# Patient Record
Sex: Female | Born: 1955 | Race: Black or African American | Hispanic: No | Marital: Single | Smoking: Never smoker
Health system: Southern US, Community
[De-identification: ages and names within clinical notes are randomized; demographics above are authoritative.]

## PROBLEM LIST (undated history)

## (undated) DIAGNOSIS — I5020 Unspecified systolic (congestive) heart failure: Secondary | ICD-10-CM

## (undated) DIAGNOSIS — F419 Anxiety disorder, unspecified: Secondary | ICD-10-CM

## (undated) DIAGNOSIS — Z992 Dependence on renal dialysis: Secondary | ICD-10-CM

## (undated) DIAGNOSIS — I5022 Chronic systolic (congestive) heart failure: Secondary | ICD-10-CM

## (undated) DIAGNOSIS — N186 End stage renal disease: Secondary | ICD-10-CM

## (undated) DIAGNOSIS — I10 Essential (primary) hypertension: Secondary | ICD-10-CM

## (undated) HISTORY — DX: End stage renal disease: Z99.2

---

## 2014-12-06 ENCOUNTER — Emergency Department (HOSPITAL_COMMUNITY): Payer: 59

## 2014-12-06 ENCOUNTER — Encounter (HOSPITAL_COMMUNITY): Payer: Self-pay | Admitting: Emergency Medicine

## 2014-12-06 ENCOUNTER — Emergency Department (HOSPITAL_COMMUNITY)
Admission: EM | Admit: 2014-12-06 | Discharge: 2014-12-06 | Disposition: A | Discharge status: Home / Self Care | Payer: 59 | Attending: Emergency Medicine | Admitting: Emergency Medicine

## 2014-12-06 DIAGNOSIS — Z880 Allergy status to penicillin: Secondary | ICD-10-CM | POA: Insufficient documentation

## 2014-12-06 DIAGNOSIS — J40 Bronchitis, not specified as acute or chronic: Secondary | ICD-10-CM

## 2014-12-06 DIAGNOSIS — J209 Acute bronchitis, unspecified: Secondary | ICD-10-CM | POA: Diagnosis not present

## 2014-12-06 DIAGNOSIS — R509 Fever, unspecified: Secondary | ICD-10-CM

## 2014-12-06 DIAGNOSIS — I10 Essential (primary) hypertension: Secondary | ICD-10-CM | POA: Diagnosis not present

## 2014-12-06 DIAGNOSIS — R0789 Other chest pain: Secondary | ICD-10-CM | POA: Insufficient documentation

## 2014-12-06 DIAGNOSIS — R000 Tachycardia, unspecified: Secondary | ICD-10-CM | POA: Diagnosis not present

## 2014-12-06 DIAGNOSIS — R0981 Nasal congestion: Secondary | ICD-10-CM | POA: Diagnosis present

## 2014-12-06 HISTORY — DX: Essential (primary) hypertension: I10

## 2014-12-06 LAB — BASIC METABOLIC PANEL
ANION GAP: 11 (ref 5–15)
BUN: 9 mg/dL (ref 6–20)
CALCIUM: 9.3 mg/dL (ref 8.9–10.3)
CO2: 24 mmol/L (ref 22–32)
CREATININE: 1.08 mg/dL — AB (ref 0.44–1.00)
Chloride: 100 mmol/L — ABNORMAL LOW (ref 101–111)
GFR calc Af Amer: >60 mL/min (ref >60)
GFR, EST NON AFRICAN AMERICAN: 56 mL/min — AB (ref >60)
GLUCOSE: 105 mg/dL — AB (ref 65–99)
Potassium: 3.6 mmol/L (ref 3.5–5.1)
Sodium: 135 mmol/L (ref 135–145)

## 2014-12-06 LAB — CBC WITH DIFFERENTIAL/PLATELET
BASOS ABS: 0 10*3/uL (ref 0.0–0.1)
BASOS PCT: 0 %
EOS PCT: 0 %
Eosinophils Absolute: 0 10*3/uL (ref 0.0–0.7)
HCT: 37.1 % (ref 36.0–46.0)
Hemoglobin: 11.9 g/dL — ABNORMAL LOW (ref 12.0–15.0)
LYMPHS PCT: 12 %
Lymphs Abs: 1.2 10*3/uL (ref 0.7–4.0)
MCH: 27.9 pg (ref 26.0–34.0)
MCHC: 32.1 g/dL (ref 30.0–36.0)
MCV: 86.9 fL (ref 78.0–100.0)
MONO ABS: 1.2 10*3/uL — AB (ref 0.1–1.0)
Monocytes Relative: 12 %
Neutro Abs: 7.8 10*3/uL — ABNORMAL HIGH (ref 1.7–7.7)
Neutrophils Relative %: 76 %
PLATELETS: 260 10*3/uL (ref 150–400)
RBC: 4.27 MIL/uL (ref 3.87–5.11)
RDW: 13.9 % (ref 11.5–15.5)
WBC: 10.3 10*3/uL (ref 4.0–10.5)

## 2014-12-06 LAB — I-STAT CG4 LACTIC ACID, ED: Lactic Acid, Venous: 1.41 mmol/L (ref 0.5–2.0)

## 2014-12-06 IMAGING — DX DG CHEST 2V
2 series · 2 of 2 positions shown · non-contrast
Comparison: None.

CLINICAL DATA: Chest congestion with runny nose and productive
cough for 2 weeks. Fever. Former smoker.

EXAM:
CHEST  2 VIEW

[chest pa]
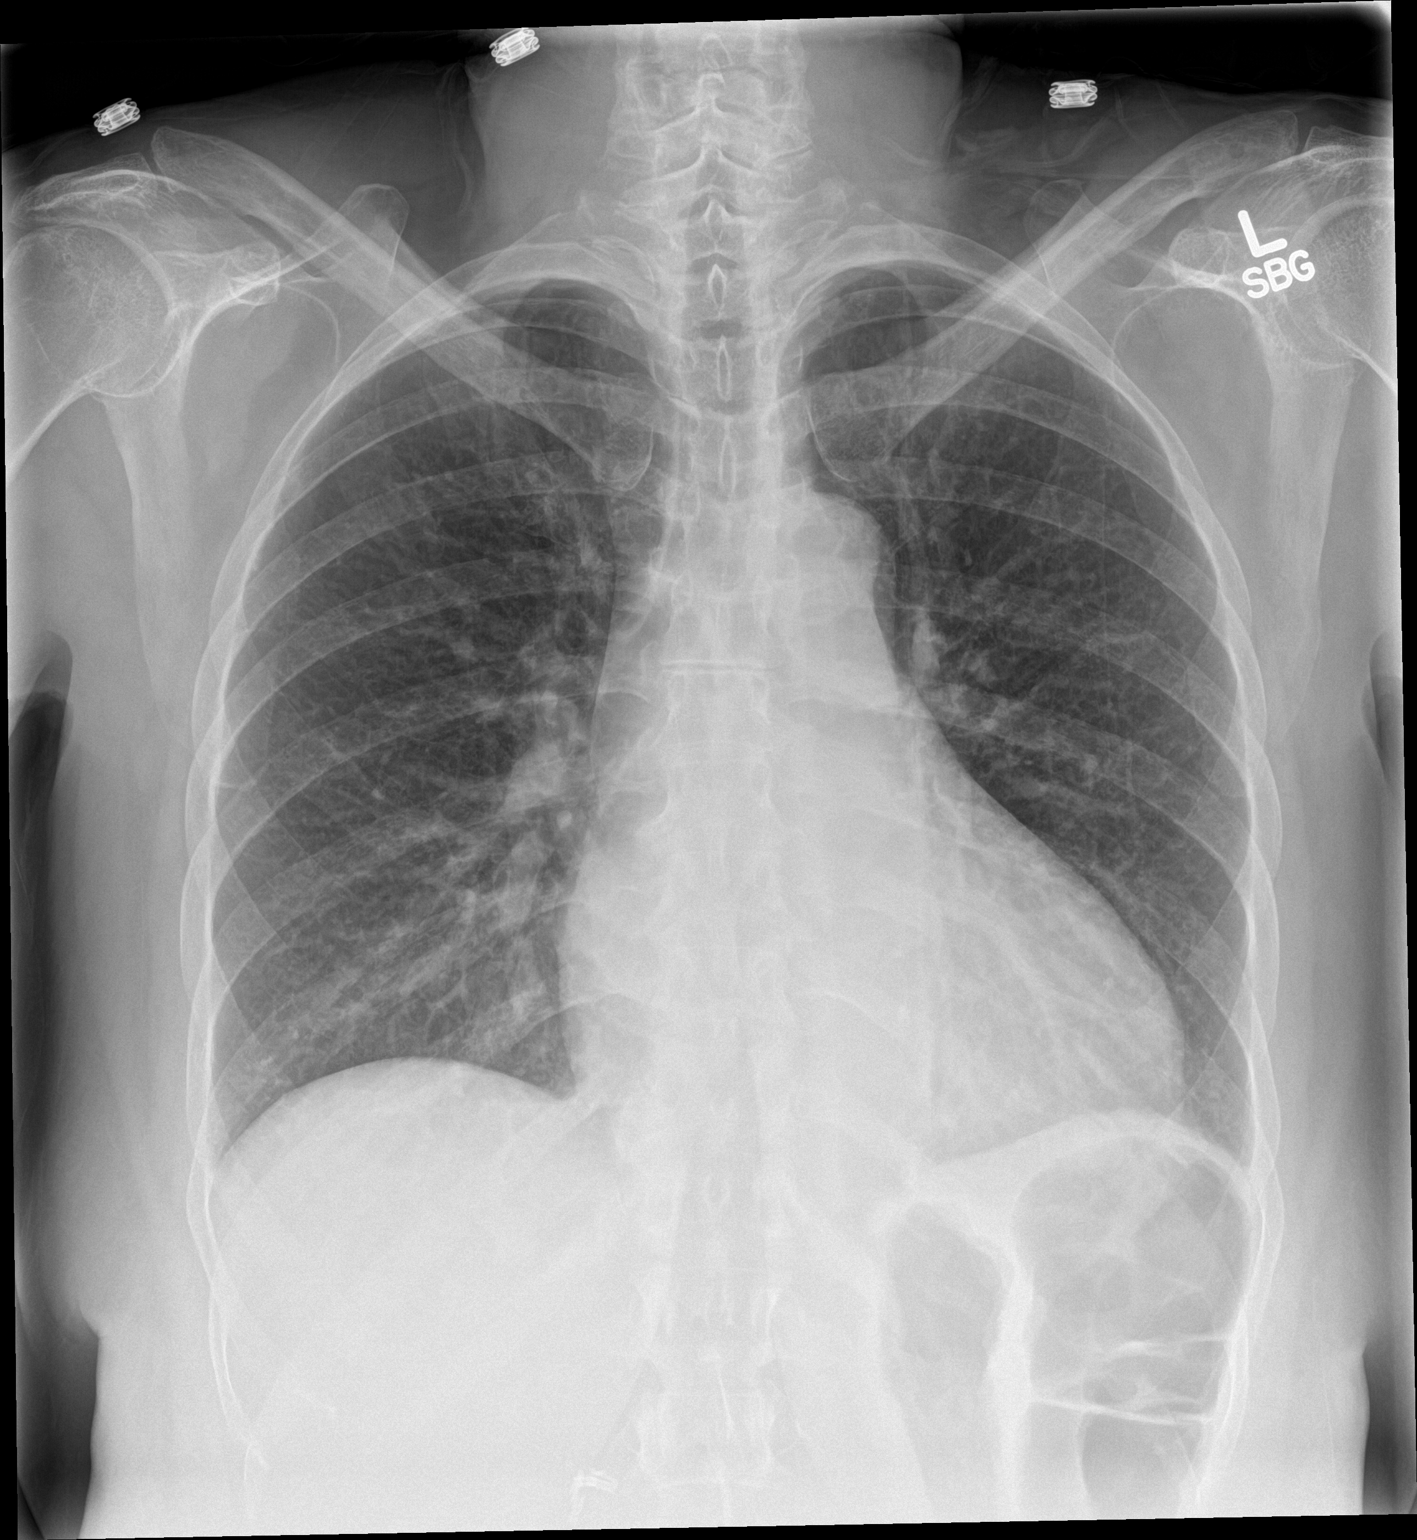

[chest lat]
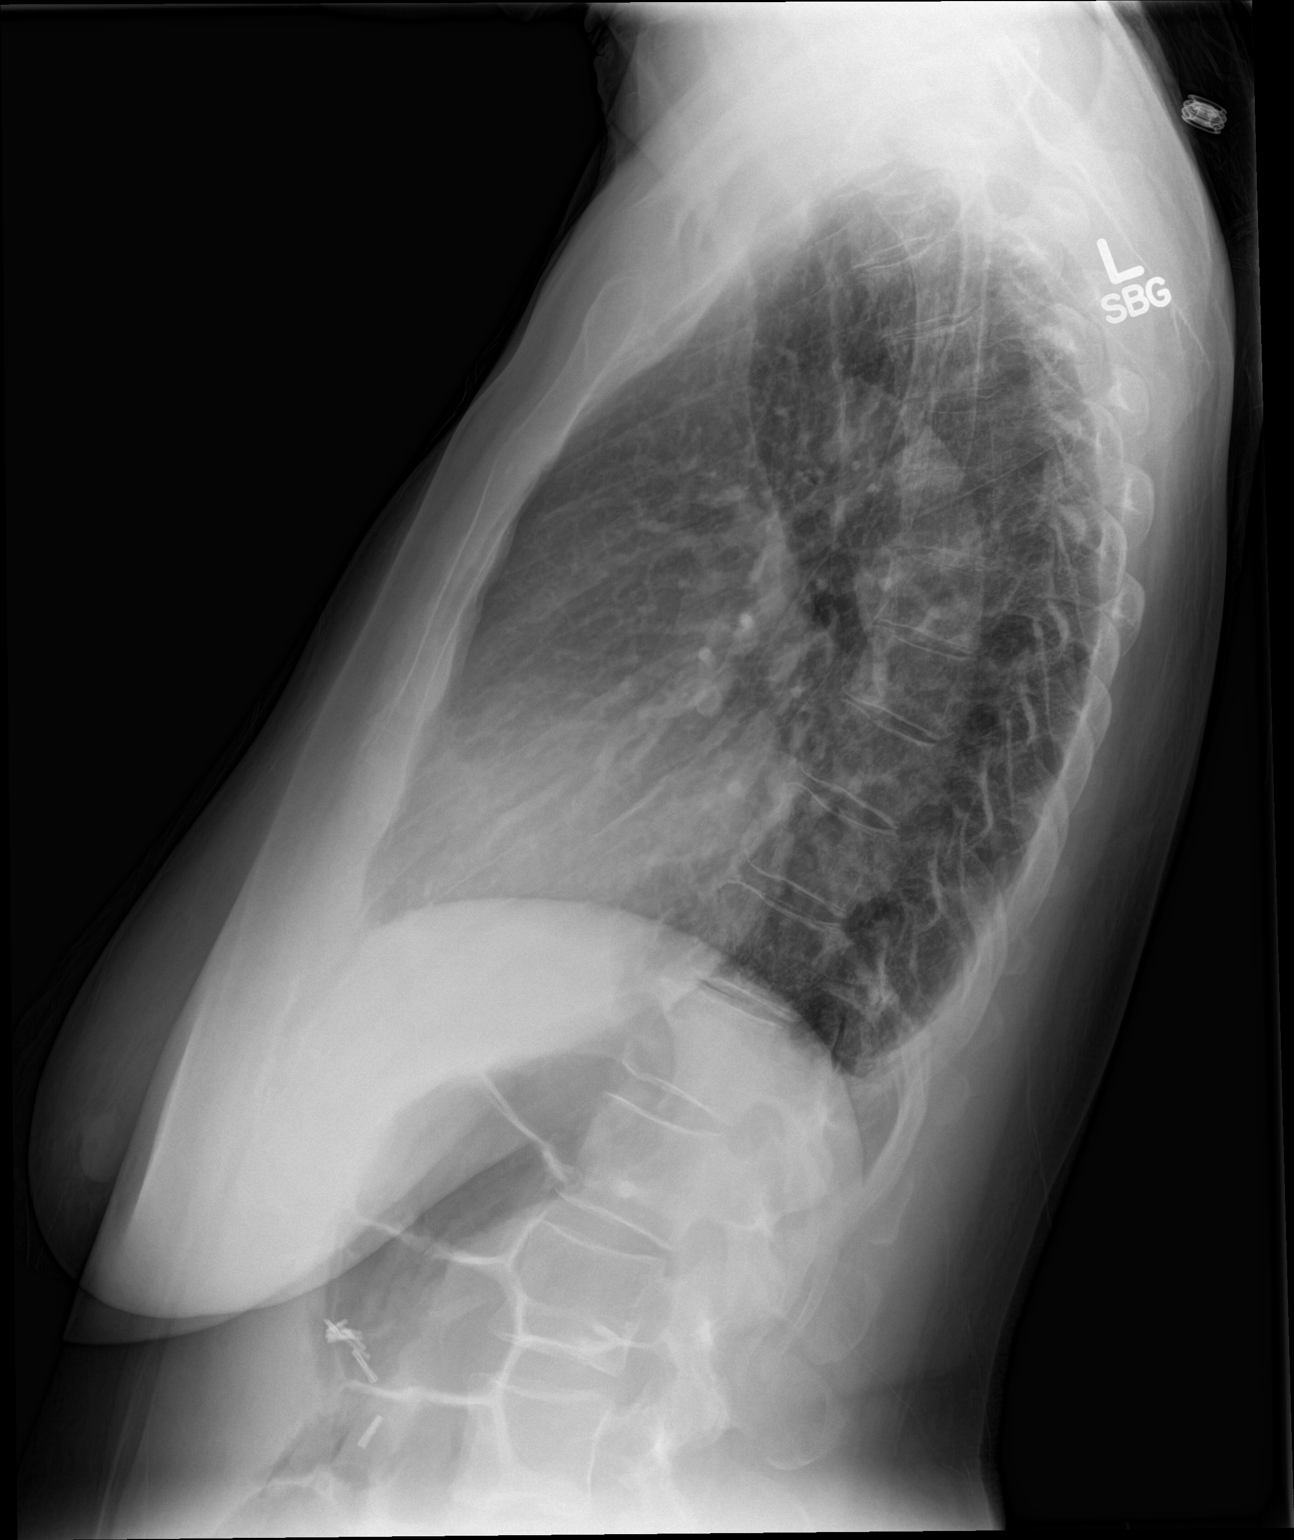

[2 of 2 positions shown; findings below may reference images not displayed]

FINDINGS: The heart is mildly enlarged, although the heart size is exaggerated
by a narrow AP diameter of the chest on the lateral view. There is
mild vascular congestion without edema, confluent airspace opacity
or pleural effusion. Cholecystectomy clips are noted. The bones
appear unremarkable.
IMPRESSION: Mild cardiomegaly and vascular congestion. No evidence of pneumonia.

## 2014-12-06 MED ORDER — BENZONATATE 100 MG PO CAPS: 100.0000 mg | ORAL_CAPSULE | Freq: Three times a day (TID) | ORAL | Status: DC

## 2014-12-06 MED ORDER — AZITHROMYCIN 250 MG PO TABS
250.0000 mg | ORAL_TABLET | Freq: Every day | ORAL | Status: DC
Start: 2014-12-06 — End: 2016-01-28

## 2014-12-06 MED ORDER — ACETAMINOPHEN 325 MG PO TABS
650.0000 mg | ORAL_TABLET | Freq: Once | ORAL | Status: AC
  Administered 2014-12-06: 650 mg via ORAL
  Filled 2014-12-06: qty 2

## 2014-12-06 MED ORDER — AZITHROMYCIN 250 MG PO TABS
500.0000 mg | ORAL_TABLET | Freq: Once | ORAL | Status: AC
  Administered 2014-12-06: 500 mg via ORAL
  Filled 2014-12-06: qty 2

## 2014-12-06 NOTE — ED Notes (Signed)
Pt sts URI sx with cough now productive with yellow sputum and nasal congestion; pt sts some fever

## 2014-12-06 NOTE — ED Notes (Signed)
Declined W/C at D/C and was escorted to lobby by RN. 

## 2014-12-06 NOTE — Discharge Instructions (Signed)
Please read and follow all provided instructions.  Your diagnoses today include:  1. Bronchitis   2. Fever, unspecified fever cause     Tests performed today include:  Chest x-ray - does not show any pneumonia  Blood counts and electrolytes - does not show severe infection  Lactic acid - does not show severe infection  EKG - shows fast heart rate  Vital signs. See below for your results today.   Medications prescribed:   Azithromycin - antibiotic for respiratory infection  You have been prescribed an antibiotic medicine: take the entire course of medicine even if you are feeling better. Stopping early can cause the antibiotic not to work.   Tessalon Perles - cough suppressant medication  Take any prescribed medications only as directed.  Home care instructions:  Follow any educational materials contained in this packet.  Follow-up instructions: Please follow-up with your primary care provider in the next 2 days for further evaluation of your symptoms and a recheck if you are not feeling better.   Return instructions:   Please return to the Emergency Department if you experience worsening symptoms.  Please return with worsening wheezing, shortness of breath, or difficulty breathing.  Return with persistent fever above 101F.   Please return if you have any other emergent concerns.  Additional Information:  Your vital signs today were: BP 126/79 mmHg   Pulse 120   Temp(Src) 102.7 F (39.3 C) (Oral)   Resp 24   Wt 64.949 kg   SpO2 100% If your blood pressure (BP) was elevated above 135/85 this visit, please have this repeated by your doctor within one month. --------------

## 2014-12-06 NOTE — ED Provider Notes (Signed)
CSN: [id number]     Arrival date & time 12/06/14  1050 History  By signing my name below, I, Soijett Blue, attest that this documentation has been prepared under the direction and in the presence of Alecia Lemming, PA-C Electronically Signed: Soijett Blue, ED Scribe. 12/06/2014. 11:55 AM.   Chief Complaint  Patient presents with  . Cough  . Nasal Congestion    The history is provided by the patient. No language interpreter was used.    Barbara Crawford is a 59 y.o. female with a PMHx of HTN who presents to the Emergency Department complaining of constant productive cough with yellow sputum x 1.5 weeks worsening 2 days ago. She notes that her symptoms began as a cold and that she has had to lay sitting up due to her symptoms as this helps her cough. She reports that she tried to make an appointment with her PCP but she was unable to get an appointment. She states that she is having associated symptoms of nasal congestion and max fever of 101. She states that she has tried tylenol with none taken today with mild relief of her fever. She denies chills, color change, rash, and any other symptoms. 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 No lower extremity swelling. Patient denies risk factors for pulmonary embolism including: unilateral leg swelling, history of DVT/PE/other blood clots, use of hormones, recent immobilizations, recent surgery, recent travel (>4hr segment), malignancy, hemoptysis.    Past Medical History  Diagnosis Date  . Hypertension    History reviewed. No pertinent past surgical history. History reviewed. No pertinent family history. Social History  Substance Use Topics  . Smoking status: Never Smoker   . Smokeless tobacco: None  . Alcohol Use: Yes   OB History    No data available     Review of Systems  Constitutional: Positive for fever. Negative for chills, appetite change and fatigue.  HENT: Positive for congestion and rhinorrhea. Negative for ear pain, sinus pressure and  sore throat.   Eyes: Negative for redness.  Respiratory: Positive for cough and chest tightness. Negative for shortness of breath and wheezing.   Gastrointestinal: Negative for nausea, vomiting, abdominal pain and diarrhea.  Genitourinary: Negative for dysuria.  Musculoskeletal: Negative for myalgias and neck stiffness.  Skin: Negative for color change and rash.  Neurological: Negative for headaches.  Hematological: Negative for adenopathy.    Allergies  Erythromycin and Penicillins  Home Medications   Prior to Admission medications   Not on File   BP 140/82 mmHg  Pulse 122  Temp(Src) 100.1 F (37.8 C) (Oral)  Resp 22  Wt 143 lb 3 oz (64.949 kg)  SpO2 99%   Physical Exam  Constitutional: She is oriented to person, place, and time. She appears well-developed and well-nourished. No distress.  HENT:  Head: Normocephalic and atraumatic.  Right Ear: Tympanic membrane, external ear and ear canal normal.  Left Ear: Tympanic membrane, external ear and ear canal normal.  Nose: Rhinorrhea present. No mucosal edema.  Mouth/Throat: Oropharynx is clear and moist. No oropharyngeal exudate, posterior oropharyngeal edema or posterior oropharyngeal erythema.  Eyes: Conjunctivae and EOM are normal. Pupils are equal, round, and reactive to light. Right eye exhibits no discharge. Left eye exhibits no discharge.  Neck: Normal range of motion. Neck supple. No JVD present.  Cardiovascular: Regular rhythm and normal heart sounds.  Tachycardia present.   No murmur heard. Pulmonary/Chest: Effort normal and breath sounds normal. No respiratory distress. She has no wheezes. She has no  rales.  Occasional cough during exam.  Abdominal: Soft. There is no tenderness. There is no rebound and no guarding.  Musculoskeletal: Normal range of motion. She exhibits no edema or tenderness.  No lower extremity tenderness or erythema suggestive of DVT or fluid overload.  Lymphadenopathy:    She has no cervical  adenopathy.  Neurological: She is alert and oriented to person, place, and time.  Skin: Skin is warm and dry. She is not diaphoretic.  Psychiatric: She has a normal mood and affect. Her behavior is normal.  Nursing note and vitals reviewed.   ED Course  Procedures (including critical care time) DIAGNOSTIC STUDIES: Oxygen Saturation is 97% on RA, nl by my interpretation.    COORDINATION OF CARE: 11:55 AM Discussed treatment plan with pt at bedside which includes CXR and pt agreed to plan.    Labs Review Labs Reviewed  CBC WITH DIFFERENTIAL/PLATELET - Abnormal; Notable for the following:    Hemoglobin 11.9 (*)    Neutro Abs 7.8 (*)    Monocytes Absolute 1.2 (*)    All other components within normal limits  BASIC METABOLIC PANEL - Abnormal; Notable for the following:    Chloride 100 (*)    Glucose, Bld 105 (*)    Creatinine, Ser 1.08 (*)    GFR calc non Af Amer 56 (*)    All other components within normal limits  I-STAT CG4 LACTIC ACID, ED    Imaging Review Dg Chest 2 View  12/06/2014  CLINICAL DATA:  Chest congestion with runny nose and productive cough for 2 weeks. Fever. Former smoker. EXAM: CHEST  2 VIEW COMPARISON:  None. FINDINGS: The heart is mildly enlarged, although the heart size is exaggerated by a narrow AP diameter of the chest on the lateral view. There is mild vascular congestion without edema, confluent airspace opacity or pleural effusion. Cholecystectomy clips are noted. The bones appear unremarkable. IMPRESSION: Mild cardiomegaly and vascular congestion. No evidence of pneumonia. Electronically Signed   By: Richardean Sale M.D.   On: 12/06/2014 13:01   I have personally reviewed and evaluated these images and lab results as part of my medical decision-making.   1:39 PM patient was ambulated without oxygen desaturation. Tachycardia to 133. Patient given dose of azithromycin here. Feel that given abnormal vital signs, although well-appearing, that workup is  indicated to ensure no additional signs of sepsis. Patient updated and agrees with this plan. Also will check EKG.  ED ECG REPORT   Date: 12/06/2014  Rate: 119  Rhythm: normal sinus rhythm  QRS Axis: left  Intervals: normal  ST/T Wave abnormalities: normal  Conduction Disutrbances:none  Narrative Interpretation:   Old EKG Reviewed: none available  I have personally reviewed the EKG tracing and agree with the computerized printout as noted.  3:47 PM Patient's labs are reassuring. Temp increased to 102.81F but she is still doing well. She has PCP follow-up and she wants to go home.   Discussed findings with Dr. Johnney Killian prior to discharge.   Patient instructed to return immediately with high persistent fever, worsening shortness of breath, increased work of breathing or trouble breathing and she agrees to do so. She has PCP follow-up and will call her doctor first thing Monday morning (in 2 days) to obtain appointment for follow-up.    MDM   Final diagnoses:  Bronchitis  Fever, unspecified fever cause   Patient with bronchitis type symptoms, now with fever. Patient with tachycardia here. She does however appear well and is in no  respiratory distress. Chest x-ray does not demonstrate pneumonia. Lab workup performed given abnormal vital signs. WBC count is normal and lactate is normal. Patient was ambulated without oxygen desaturation. Patient states that she is typically tachycardic when she gets sick stating "that's just me", however I do not have any previous vital signs to compare to. Patient does not have significant risk factor profile for PE and constellation of symptoms is more suggestive of infection given fever to 102.44F and cough and PE. Do not feel that screening with d-dimer is indicated given this presentation.  I personally performed the services described in this documentation, which was scribed in my presence. The recorded information has been reviewed and is  accurate.     Carlisle Cater, PA-C 12/06/14 1553  Charlesetta Shanks, MD 12/07/14 1524

## 2015-02-08 DIAGNOSIS — R059 Cough, unspecified: Secondary | ICD-10-CM | POA: Insufficient documentation

## 2016-01-28 ENCOUNTER — Encounter (HOSPITAL_COMMUNITY): Payer: Self-pay | Admitting: Emergency Medicine

## 2016-01-28 ENCOUNTER — Emergency Department (HOSPITAL_COMMUNITY): Payer: BLUE CROSS/BLUE SHIELD

## 2016-01-28 ENCOUNTER — Emergency Department (HOSPITAL_COMMUNITY)
Admission: EM | Admit: 2016-01-28 | Discharge: 2016-01-28 | Disposition: A | Discharge status: Home / Self Care | Payer: BLUE CROSS/BLUE SHIELD | Attending: Emergency Medicine | Admitting: Emergency Medicine

## 2016-01-28 DIAGNOSIS — Z7982 Long term (current) use of aspirin: Secondary | ICD-10-CM | POA: Diagnosis not present

## 2016-01-28 DIAGNOSIS — I10 Essential (primary) hypertension: Secondary | ICD-10-CM | POA: Insufficient documentation

## 2016-01-28 DIAGNOSIS — R000 Tachycardia, unspecified: Secondary | ICD-10-CM | POA: Diagnosis present

## 2016-01-28 DIAGNOSIS — J189 Pneumonia, unspecified organism: Secondary | ICD-10-CM | POA: Diagnosis not present

## 2016-01-28 LAB — BASIC METABOLIC PANEL
Anion gap: 10 (ref 5–15)
BUN: 14 mg/dL (ref 6–20)
CHLORIDE: 103 mmol/L (ref 101–111)
CO2: 25 mmol/L (ref 22–32)
Calcium: 8.9 mg/dL (ref 8.9–10.3)
Creatinine, Ser: 0.93 mg/dL (ref 0.44–1.00)
GFR calc Af Amer: >60 mL/min (ref >60)
GFR calc non Af Amer: >60 mL/min (ref >60)
Glucose, Bld: 92 mg/dL (ref 65–99)
Potassium: 3.2 mmol/L — ABNORMAL LOW (ref 3.5–5.1)
SODIUM: 138 mmol/L (ref 135–145)

## 2016-01-28 LAB — URINALYSIS, ROUTINE W REFLEX MICROSCOPIC
Bilirubin Urine: NEGATIVE
Glucose, UA: NEGATIVE mg/dL
Ketones, ur: 20 mg/dL — AB
LEUKOCYTES UA: NEGATIVE
NITRITE: NEGATIVE
PH: 5 (ref 5.0–8.0)
Protein, ur: 30 mg/dL — AB
SPECIFIC GRAVITY, URINE: 1.012 (ref 1.005–1.030)

## 2016-01-28 LAB — CBC
HEMATOCRIT: 37.3 % (ref 36.0–46.0)
Hemoglobin: 12.6 g/dL (ref 12.0–15.0)
MCH: 29 pg (ref 26.0–34.0)
MCHC: 33.8 g/dL (ref 30.0–36.0)
MCV: 85.9 fL (ref 78.0–100.0)
Platelets: 249 10*3/uL (ref 150–400)
RBC: 4.34 MIL/uL (ref 3.87–5.11)
RDW: 13.7 % (ref 11.5–15.5)
WBC: 17.2 10*3/uL — AB (ref 4.0–10.5)

## 2016-01-28 LAB — I-STAT TROPONIN, ED: Troponin i, poc: 0.02 ng/mL (ref 0.00–0.08)

## 2016-01-28 LAB — I-STAT CG4 LACTIC ACID, ED: LACTIC ACID, VENOUS: 1.34 mmol/L (ref 0.5–1.9)

## 2016-01-28 IMAGING — CR DG CHEST 2V
2 series · 2 of 2 positions shown · non-contrast
Comparison: PA and lateral chest x-ray [DATE]

CLINICAL DATA: Productive cough, tachycardia, recently diagnosed
with flu syndrome.

EXAM:
CHEST  2 VIEW

[w chest pa]
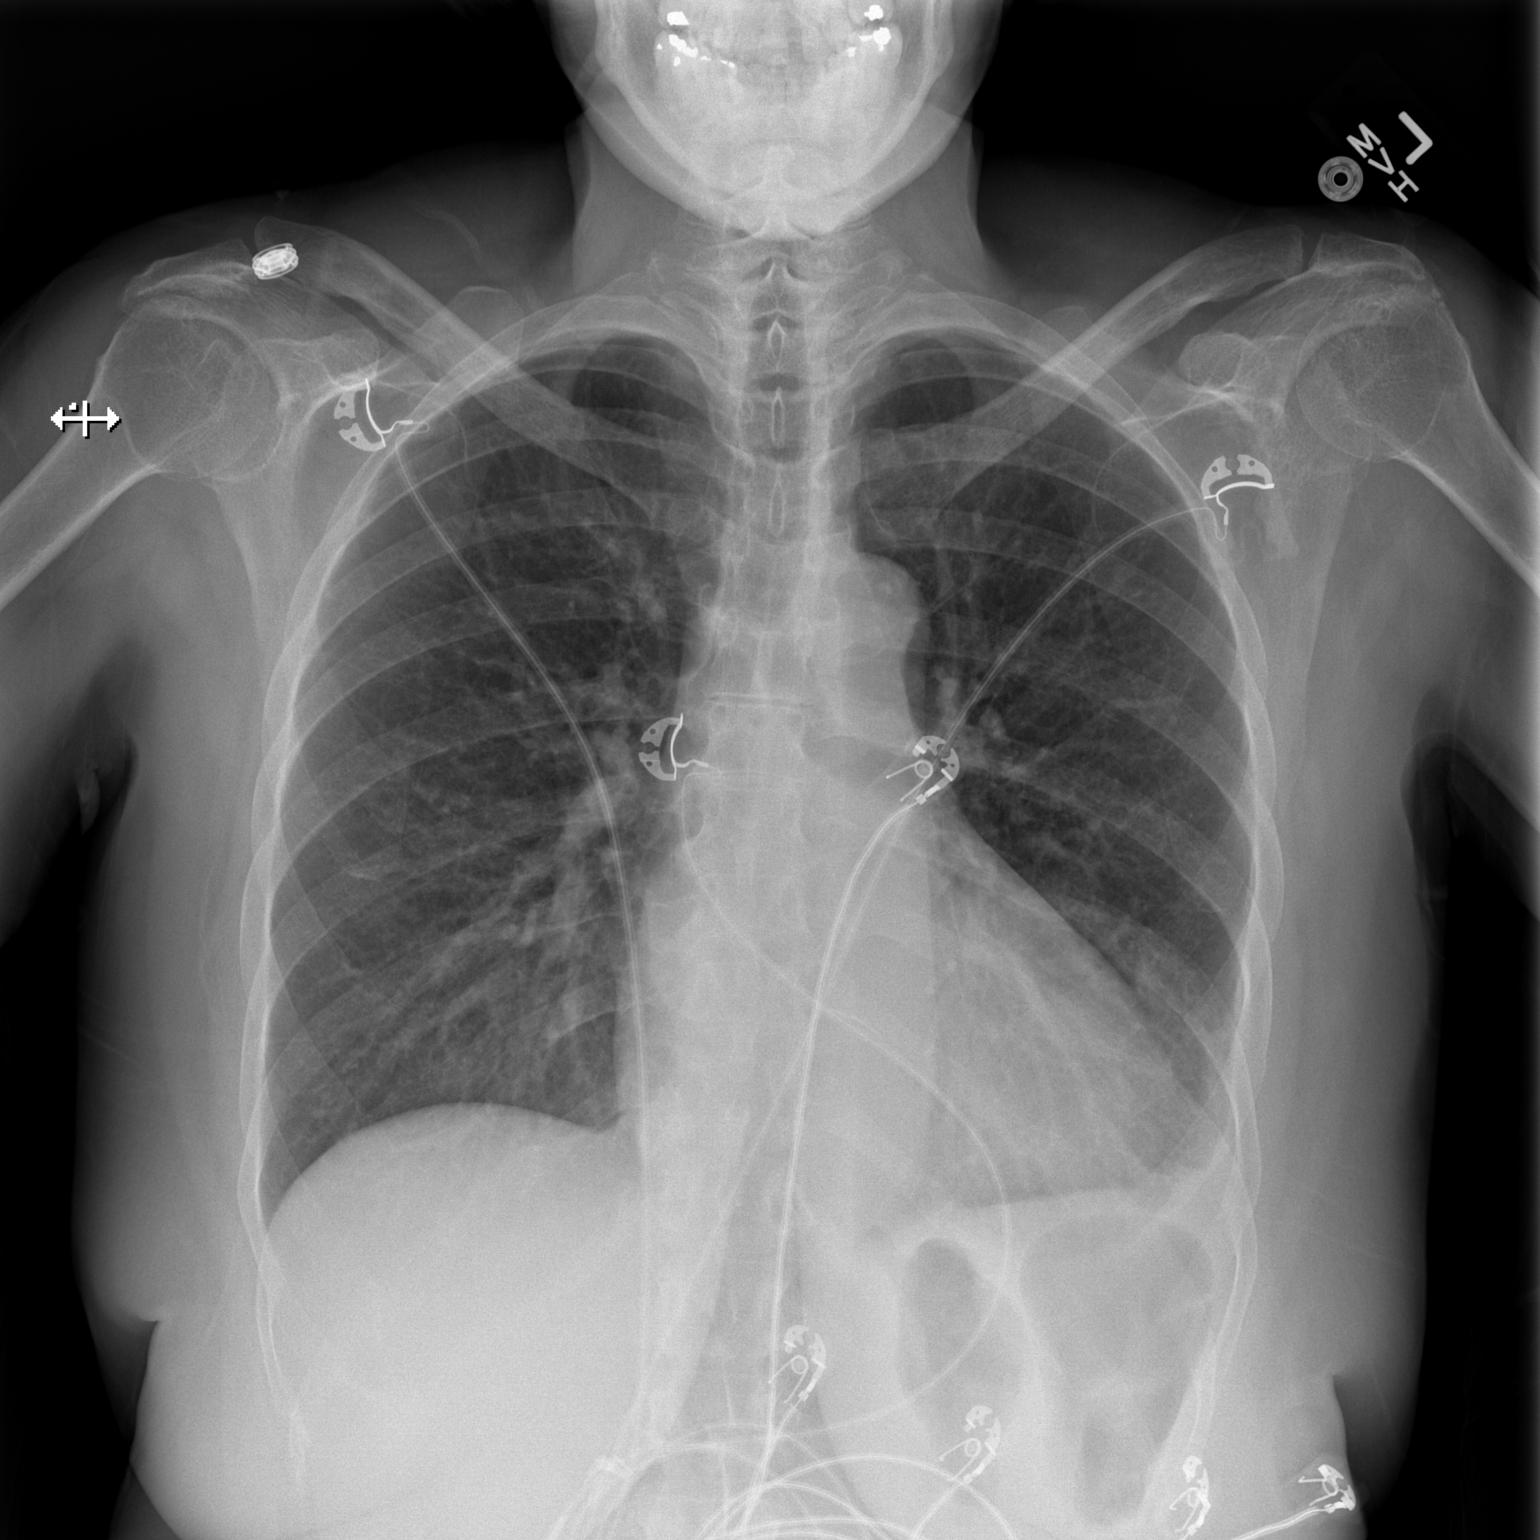

[w chest lat]
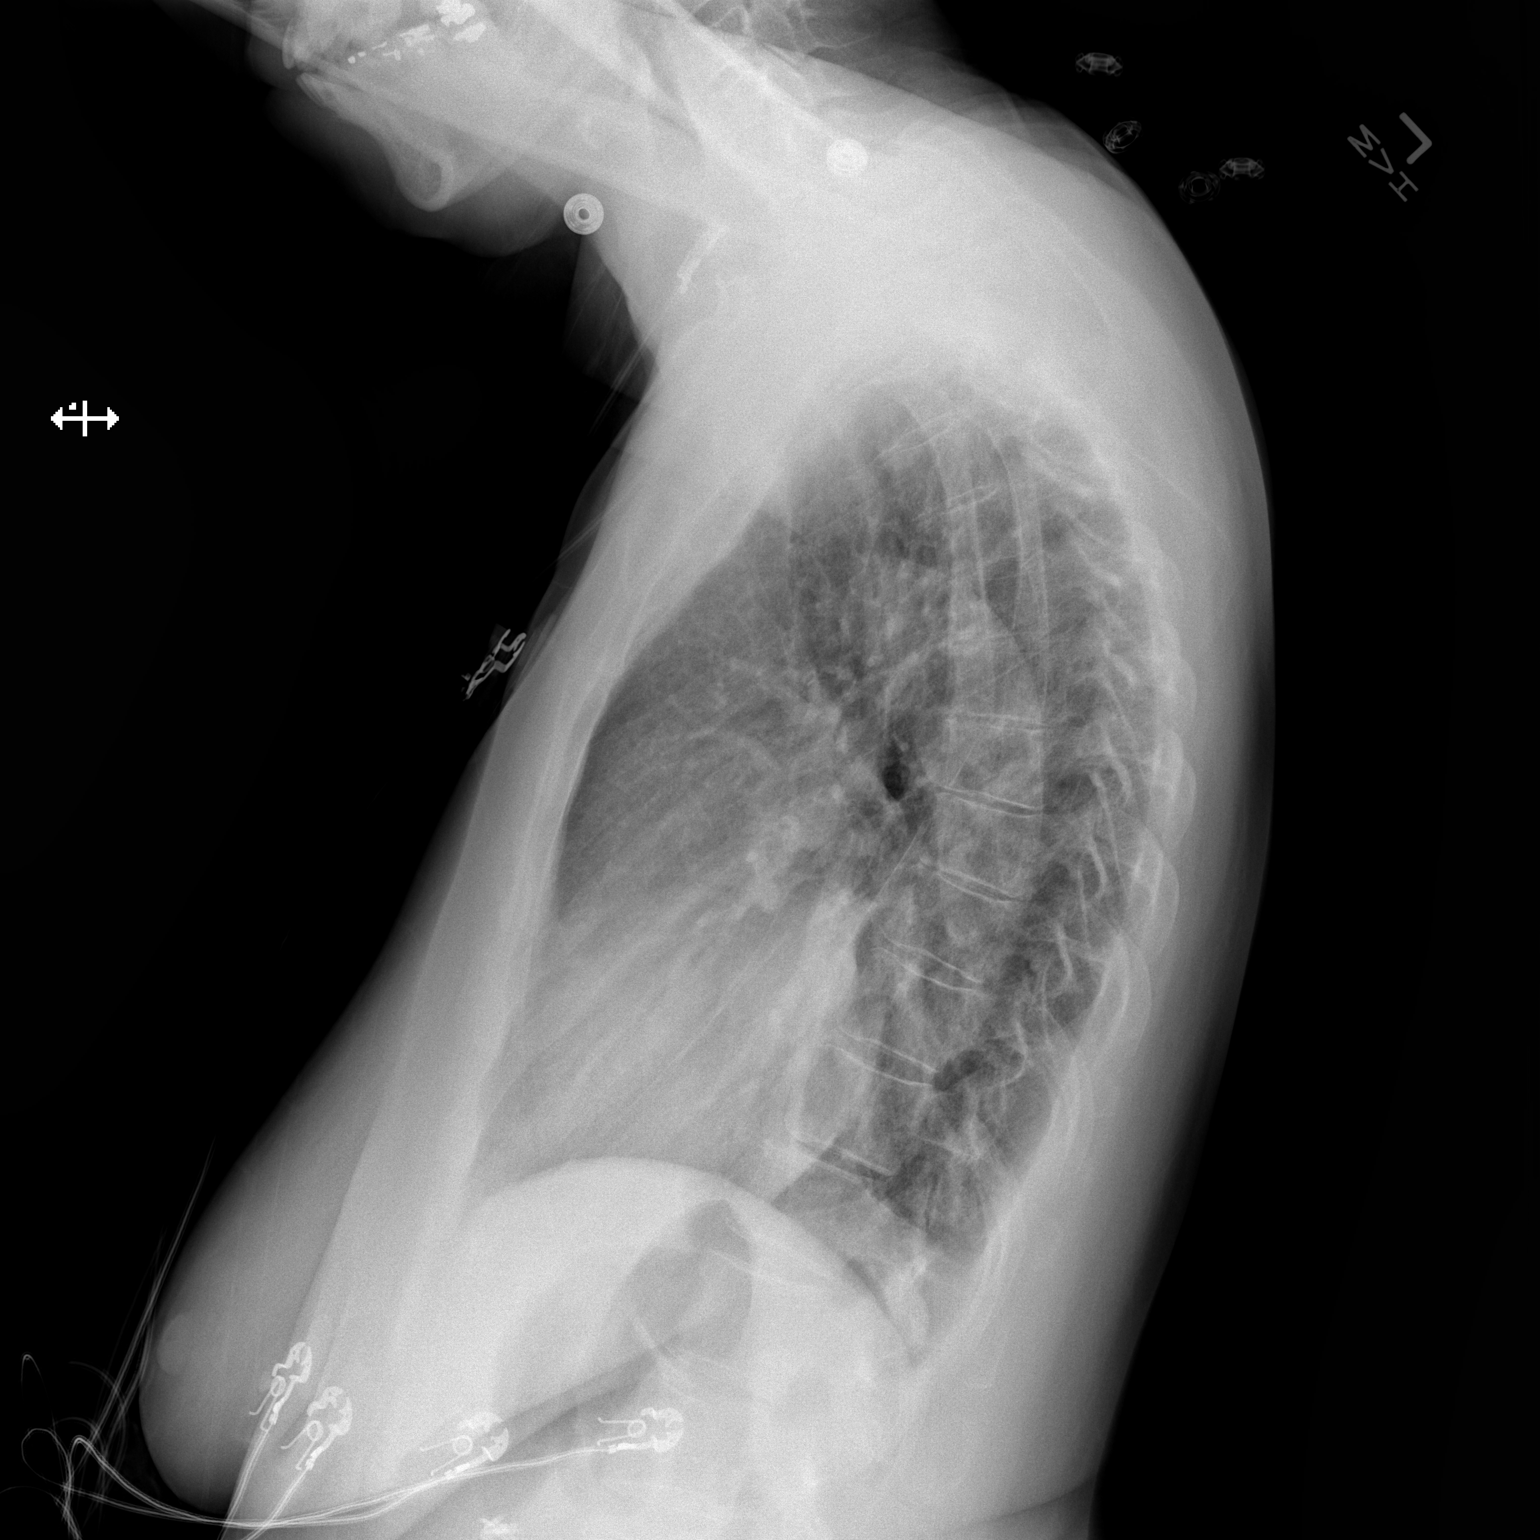

[2 of 2 positions shown; findings below may reference images not displayed]

FINDINGS: The lungs are well-expanded. There is a small left pleural effusion.
The interstitial markings are coarse though stable. There is subtle
increased density adjacent to the left heart border with partial
silhouetting of the apex. The heart and pulmonary vascularity are
normal. The bony thorax exhibits no acute abnormality.
IMPRESSION: New small left pleural effusion. Early pneumonia in the anterior
aspect of the left lower lobe is suspected. Followup PA and lateral
chest X-ray is recommended in 3-4 weeks following trial of
antibiotic therapy to ensure resolution and exclude underlying
malignancy.

## 2016-01-28 MED ORDER — SODIUM CHLORIDE 0.9 % IV BOLUS (SEPSIS): 1000.0000 mL | Freq: Once | INTRAVENOUS | Status: DC

## 2016-01-28 MED ORDER — DEXTROSE 5 % IV SOLN: 500.0000 mg | INTRAVENOUS | Status: DC

## 2016-01-28 MED ORDER — SODIUM CHLORIDE 0.9 % IV BOLUS (SEPSIS)
1000.0000 mL | Freq: Once | INTRAVENOUS | Status: AC
  Administered 2016-01-28: 1000 mL via INTRAVENOUS

## 2016-01-28 MED ORDER — AZITHROMYCIN 250 MG PO TABS: 250.0000 mg | ORAL_TABLET | Freq: Every day | ORAL | 0 refills | Status: DC

## 2016-01-28 MED ORDER — DEXTROSE 5 % IV SOLN: 1.0000 g | INTRAVENOUS | Status: DC

## 2016-01-28 MED ORDER — SODIUM CHLORIDE 0.9 % IV BOLUS (SEPSIS)
1000.0000 mL | Freq: Once | INTRAVENOUS | Status: AC
Start: 2016-01-28 — End: 2016-01-28
  Administered 2016-01-28: 1000 mL via INTRAVENOUS

## 2016-01-28 MED ORDER — DEXTROSE 5 % IV SOLN
1.0000 g | Freq: Once | INTRAVENOUS | Status: AC
  Administered 2016-01-28: 1 g via INTRAVENOUS
  Filled 2016-01-28: qty 10

## 2016-01-28 MED ORDER — DEXTROSE 5 % IV SOLN
500.0000 mg | Freq: Once | INTRAVENOUS | Status: AC
  Administered 2016-01-28: 500 mg via INTRAVENOUS
  Filled 2016-01-28: qty 500

## 2016-01-28 MED ORDER — ACETAMINOPHEN 500 MG PO TABS
1000.0000 mg | ORAL_TABLET | Freq: Once | ORAL | Status: AC
  Administered 2016-01-28: 1000 mg via ORAL
  Filled 2016-01-28: qty 2

## 2016-01-28 NOTE — ED Provider Notes (Signed)
[facility] DEPT Provider Note   CSN: [id number] Arrival date & time: 01/28/16  1436   History   Chief Complaint Chief Complaint  Patient presents with  . URI  . Palpitations    HPI Barbara Crawford is a 61 y.o. female.  HPI   Barbara Crawford is a 61 y.o. female with a PMHx of HTN who presents to the Emergency Department after being seen by her PCP and sent to the ER due to tachycardia of 130. Patient has not been feeling well since Tuesday. She has had productive cough with chest tightness. She has been having fevers at home with nasal congestion. She denies chills, color change, rash, and any other symptoms. 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 No lower extremity swelling. Patient denies risk factors for pulmonary embolism including: unilateral leg swelling, history of DVT/PE/other blood clots, use of hormones, recent immobilizations, recent surgery, recent travel (>4hr segment), malignancy, hemoptysis  Past Medical History:  Diagnosis Date  . Hypertension    There are no active problems to display for this patient.  History reviewed. No pertinent surgical history.  OB History    No data available     Home Medications    Prior to Admission medications   Medication Sig Start Date End Date Taking? Authorizing Provider  acetaminophen (TYLENOL) 500 MG tablet Take 500 mg by mouth every 6 (six) hours as needed.   Yes Historical Provider, MD  aspirin EC 81 MG tablet Take 81 mg by mouth daily.   Yes Historical Provider, MD  NIFEdipine (ADALAT CC) 90 MG 24 hr tablet Take 30-90 mg by mouth 2 (two) times daily. 90mg   By mouth every morning and 30mg  by mouth every night 01/11/16  Yes Historical Provider, MD  azithromycin (ZITHROMAX) 250 MG tablet Take 1 tablet (250 mg total) by mouth daily. Take first 2 tablets together, then 1 every day until finished. 01/28/16   Delos Haring, PA-C   Family History History reviewed. No pertinent family history.  Social History Social History    Substance Use Topics  . Smoking status: Never Smoker  . Smokeless tobacco: Not on file  . Alcohol use Yes    Allergies   Erythromycin and Penicillins  Review of Systems Review of Systems Review of Systems All other systems negative except as documented in the HPI. All pertinent positives and negatives as reviewed in the HPI.   Physical Exam Updated Vital Signs BP 115/78   Pulse 108   Temp 99.4 F (37.4 C) (Oral)   Resp 25   Ht 5\' 3"  (1.6 m)   Wt 65.3 kg   SpO2 96%   BMI 25.51 kg/m   Physical Exam  Constitutional: She appears well-developed and well-nourished. She appears ill. No distress.  HENT:  Head: Normocephalic and atraumatic.  Right Ear: Tympanic membrane and ear canal normal.  Left Ear: Tympanic membrane and ear canal normal.  Nose: Nose normal.  Mouth/Throat: Uvula is midline, oropharynx is clear and moist and mucous membranes are normal.  Eyes: Pupils are equal, round, and reactive to light.  Neck: Normal range of motion. Neck supple.  Cardiovascular: Regular rhythm.  Tachycardia present.   Pulmonary/Chest: Effort normal. No tachypnea. No respiratory distress. She has no decreased breath sounds. She has no wheezes.  Abdominal: Soft.  No signs of abdominal distention  Musculoskeletal:  No LE swelling  Neurological: She is alert.  Acting at baseline  Skin: Skin is warm. No rash noted. She is diaphoretic.  Nursing note and vitals  reviewed.    ED Treatments / Results  Labs (all labs ordered are listed, but only abnormal results are displayed) Labs Reviewed  BASIC METABOLIC PANEL - Abnormal; Notable for the following:       Result Value   Potassium 3.2 (*)    All other components within normal limits  CBC - Abnormal; Notable for the following:    WBC 17.2 (*)    All other components within normal limits  URINALYSIS, ROUTINE W REFLEX MICROSCOPIC - Abnormal; Notable for the following:    Hgb urine dipstick MODERATE (*)    Ketones, ur 20 (*)     Protein, ur 30 (*)    Bacteria, UA RARE (*)    Squamous Epithelial / LPF 0-5 (*)    All other components within normal limits  CULTURE, BLOOD (ROUTINE X 2)  CULTURE, BLOOD (ROUTINE X 2)  URINE CULTURE  I-STAT TROPOININ, ED  I-STAT CG4 LACTIC ACID, ED    EKG  EKG Interpretation  Date/Time:  Thursday January 28 2016 14:57:52 EST Ventricular Rate:  129 PR Interval:    QRS Duration: 79 QT Interval:  351 QTC Calculation: 515 R Axis:   -70 Text Interpretation:  Sinus tachycardia Left atrial enlargement Inferior infarct, old Anterior infarct, old Prolonged QT interval No significant change since last tracing Confirmed by ISAACS MD, CAMERON [phone]) on 01/28/2016 5:43:42 PM       Radiology Dg Chest 2 View  Result Date: 01/28/2016 CLINICAL DATA:  Productive cough, tachycardia, recently diagnosed with flu syndrome. EXAM: CHEST  2 VIEW COMPARISON:  PA and lateral chest x-ray of December 06, 2014 FINDINGS: The lungs are well-expanded. There is a small left pleural effusion. The interstitial markings are coarse though stable. There is subtle increased density adjacent to the left heart border with partial silhouetting of the apex. The heart and pulmonary vascularity are normal. The bony thorax exhibits no acute abnormality.   IMPRESSION: New small left pleural effusion. Early pneumonia in the anterior aspect of the left lower lobe is suspected. Followup PA and lateral chest X-ray is recommended in 3-4 weeks following trial of antibiotic therapy to ensure resolution and exclude underlying malignancy. Electronically Signed   By: David  Martinique M.D.   On: 01/28/2016 15:15    Procedures Procedures (including critical care time)  Medications Ordered in ED Medications  azithromycin (ZITHROMAX) 500 mg in dextrose 5 % 250 mL IVPB (not administered)  cefTRIAXone (ROCEPHIN) 1 g in dextrose 5 % 50 mL IVPB (not administered)  sodium chloride 0.9 % bolus 1,000 mL (not administered)  sodium chloride 0.9 %  bolus 1,000 mL (0 mLs Intravenous Stopped 01/28/16 1949)    And  sodium chloride 0.9 % bolus 1,000 mL (1,000 mLs Intravenous New Bag/Given 01/28/16 2000)  cefTRIAXone (ROCEPHIN) 1 g in dextrose 5 % 50 mL IVPB (0 g Intravenous Stopped 01/28/16 1856)  azithromycin (ZITHROMAX) 500 mg in dextrose 5 % 250 mL IVPB (500 mg Intravenous New Bag/Given 01/28/16 1956)  acetaminophen (TYLENOL) tablet 1,000 mg (1,000 mg Oral Given 01/28/16 1826)     Initial Impression / Assessment and Plan / ED Course  I have reviewed the triage vital signs and the nursing notes.  Pertinent labs & imaging results that were available during my care of the patient were reviewed by me and considered in my medical decision making (see chart for details).  CRITICAL CARE Performed by: Linus Mako Total critical care time: 60 minutes Critical care time was exclusive of separately billable procedures and  treating other patients. Critical care was necessary to treat or prevent imminent or life-threatening deterioration. Critical care was time spent personally by me on the following activities: development of treatment plan with patient and/or surrogate as well as nursing, discussions with consultants, evaluation of patient's response to treatment, examination of patient, obtaining history from patient or surrogate, ordering and performing treatments and interventions, ordering and review of laboratory studies, ordering and review of radiographic studies, pulse oximetry and re-evaluation of patient's condition.     5:51 pm Tachcyardic with low grade fever and elevated white count, work-up initiated to evaluate for sepsis and treat for early sepsis.   7:04 pm Patients lactic acid is normal, BMP is unremarkable. She does have elevated white count. She is receiving her fluid boluses and IV abx. If her pulse improves and she is feeling better she has been advised that she will likely be able to go home which she is happy  about.  8:51 pm Patient endorses feeling significantly better. She is receiving her 2nd abx and second liter of fluids now. Pulse has improved from 130 to 115, she says she is feeling more energetic and her mouth isn't dry anymore.  9:40 pm Patients pulse improved to 108, I gave her the option for admission vs discharge and she adamantly wants to go home. I tell her that I will discharge her if she goes to her PCP's office for a vital sign check tomorrow. She promises that she will. Strict return precautions given.   Rx: Z-pack.   Final Clinical Impressions(s) / ED Diagnoses   Final diagnoses:  Community acquired pneumonia, unspecified laterality    New Prescriptions New Prescriptions   AZITHROMYCIN (ZITHROMAX) 250 MG TABLET    Take 1 tablet (250 mg total) by mouth daily. Take first 2 tablets together, then 1 every day until finished.     Delos Haring, PA-C 01/28/16 2142    Duffy Bruce, MD 01/30/16 1040

## 2016-01-28 NOTE — Progress Notes (Signed)
Pharmacy Antibiotic Note  Barbara Crawford is a 61 y.o. female admitted on 01/28/2016 with pneumonia.  Pharmacy has been consulted for azithromycin and ceftriaxone dosing.  Plan: Azithromycin 500 mg IV q24h. Ceftriaxone 1g IV q24h. Noted allergies for erythromycin and penicillin.  Patient has taken azithromycin PTA without issue.  Patient has already received a dose of ceftriaxone in the ED without issue. No further adjustments anticipated. Pharmacy will sign off. Please re-consult if needed.    Temp (24hrs), Avg:99.4 F (37.4 C), Min:99.4 F (37.4 C), Max:99.4 F (37.4 C)   Recent Labs Lab 01/28/16 1543  WBC 17.2*  CREATININE 0.93    CrCl cannot be calculated (Unknown ideal weight.).    Allergies  Allergen Reactions  . Erythromycin   . Penicillins      Thank you for allowing pharmacy to be a part of this patient's care.  Hershal Coria 01/28/2016 5:44 PM

## 2016-01-28 NOTE — ED Notes (Signed)
One set of blood cultures collected at this time

## 2016-01-28 NOTE — ED Triage Notes (Addendum)
Pt reports she was just diagnosed with the flu at PCP office. Has had productive cough at home. Pt reports here HR was high at her PCP office. HR 130 in triage.

## 2016-01-30 LAB — URINE CULTURE

## 2016-02-02 LAB — CULTURE, BLOOD (ROUTINE X 2)
CULTURE: NO GROWTH
Culture: NO GROWTH

## 2017-01-09 DIAGNOSIS — M25612 Stiffness of left shoulder, not elsewhere classified: Secondary | ICD-10-CM | POA: Insufficient documentation

## 2017-08-16 DIAGNOSIS — R000 Tachycardia, unspecified: Secondary | ICD-10-CM | POA: Insufficient documentation

## 2017-08-16 DIAGNOSIS — J209 Acute bronchitis, unspecified: Secondary | ICD-10-CM | POA: Insufficient documentation

## 2019-12-06 ENCOUNTER — Other Ambulatory Visit: Payer: Self-pay | Admitting: Nurse Practitioner

## 2019-12-06 DIAGNOSIS — Z1231 Encounter for screening mammogram for malignant neoplasm of breast: Secondary | ICD-10-CM

## 2020-02-25 ENCOUNTER — Inpatient Hospital Stay (HOSPITAL_COMMUNITY)
Admission: EM | Admit: 2020-02-25 | Discharge: 2020-04-04 | DRG: 673 | Disposition: A | Discharge status: Home Health | Payer: 59 | Attending: Internal Medicine | Admitting: Internal Medicine

## 2020-02-25 ENCOUNTER — Emergency Department (HOSPITAL_COMMUNITY): Payer: 59

## 2020-02-25 ENCOUNTER — Inpatient Hospital Stay (HOSPITAL_COMMUNITY): Payer: 59

## 2020-02-25 DIAGNOSIS — R0600 Dyspnea, unspecified: Secondary | ICD-10-CM | POA: Diagnosis not present

## 2020-02-25 DIAGNOSIS — I120 Hypertensive chronic kidney disease with stage 5 chronic kidney disease or end stage renal disease: Secondary | ICD-10-CM | POA: Diagnosis present

## 2020-02-25 DIAGNOSIS — Z452 Encounter for adjustment and management of vascular access device: Secondary | ICD-10-CM | POA: Diagnosis not present

## 2020-02-25 DIAGNOSIS — N001 Acute nephritic syndrome with focal and segmental glomerular lesions: Secondary | ICD-10-CM | POA: Diagnosis present

## 2020-02-25 DIAGNOSIS — D649 Anemia, unspecified: Secondary | ICD-10-CM | POA: Diagnosis not present

## 2020-02-25 DIAGNOSIS — D62 Acute posthemorrhagic anemia: Secondary | ICD-10-CM | POA: Diagnosis not present

## 2020-02-25 DIAGNOSIS — Z4659 Encounter for fitting and adjustment of other gastrointestinal appliance and device: Secondary | ICD-10-CM

## 2020-02-25 DIAGNOSIS — Z9911 Dependence on respirator [ventilator] status: Secondary | ICD-10-CM | POA: Diagnosis not present

## 2020-02-25 DIAGNOSIS — S37019A Minor contusion of unspecified kidney, initial encounter: Secondary | ICD-10-CM | POA: Diagnosis not present

## 2020-02-25 DIAGNOSIS — G928 Other toxic encephalopathy: Secondary | ICD-10-CM | POA: Diagnosis present

## 2020-02-25 DIAGNOSIS — T82818A Embolism of vascular prosthetic devices, implants and grafts, initial encounter: Secondary | ICD-10-CM | POA: Diagnosis not present

## 2020-02-25 DIAGNOSIS — N041 Nephrotic syndrome with focal and segmental glomerular lesions: Secondary | ICD-10-CM | POA: Diagnosis not present

## 2020-02-25 DIAGNOSIS — Y832 Surgical operation with anastomosis, bypass or graft as the cause of abnormal reaction of the patient, or of later complication, without mention of misadventure at the time of the procedure: Secondary | ICD-10-CM | POA: Diagnosis not present

## 2020-02-25 DIAGNOSIS — J90 Pleural effusion, not elsewhere classified: Secondary | ICD-10-CM

## 2020-02-25 DIAGNOSIS — R8271 Bacteriuria: Secondary | ICD-10-CM | POA: Diagnosis not present

## 2020-02-25 DIAGNOSIS — D619 Aplastic anemia, unspecified: Secondary | ICD-10-CM | POA: Diagnosis present

## 2020-02-25 DIAGNOSIS — R569 Unspecified convulsions: Secondary | ICD-10-CM | POA: Diagnosis not present

## 2020-02-25 DIAGNOSIS — N179 Acute kidney failure, unspecified: Secondary | ICD-10-CM

## 2020-02-25 DIAGNOSIS — J9621 Acute and chronic respiratory failure with hypoxia: Secondary | ICD-10-CM | POA: Diagnosis not present

## 2020-02-25 DIAGNOSIS — N059 Unspecified nephritic syndrome with unspecified morphologic changes: Secondary | ICD-10-CM | POA: Diagnosis not present

## 2020-02-25 DIAGNOSIS — I468 Cardiac arrest due to other underlying condition: Secondary | ICD-10-CM | POA: Diagnosis not present

## 2020-02-25 DIAGNOSIS — R571 Hypovolemic shock: Secondary | ICD-10-CM | POA: Diagnosis not present

## 2020-02-25 DIAGNOSIS — Y848 Other medical procedures as the cause of abnormal reaction of the patient, or of later complication, without mention of misadventure at the time of the procedure: Secondary | ICD-10-CM | POA: Diagnosis not present

## 2020-02-25 DIAGNOSIS — J189 Pneumonia, unspecified organism: Secondary | ICD-10-CM | POA: Diagnosis present

## 2020-02-25 DIAGNOSIS — Z7982 Long term (current) use of aspirin: Secondary | ICD-10-CM

## 2020-02-25 DIAGNOSIS — E871 Hypo-osmolality and hyponatremia: Secondary | ICD-10-CM | POA: Diagnosis present

## 2020-02-25 DIAGNOSIS — I10 Essential (primary) hypertension: Secondary | ICD-10-CM | POA: Diagnosis not present

## 2020-02-25 DIAGNOSIS — R5381 Other malaise: Secondary | ICD-10-CM | POA: Diagnosis present

## 2020-02-25 DIAGNOSIS — A419 Sepsis, unspecified organism: Secondary | ICD-10-CM | POA: Diagnosis present

## 2020-02-25 DIAGNOSIS — E875 Hyperkalemia: Secondary | ICD-10-CM | POA: Diagnosis present

## 2020-02-25 DIAGNOSIS — J181 Lobar pneumonia, unspecified organism: Secondary | ICD-10-CM | POA: Diagnosis present

## 2020-02-25 DIAGNOSIS — N390 Urinary tract infection, site not specified: Secondary | ICD-10-CM | POA: Diagnosis present

## 2020-02-25 DIAGNOSIS — E861 Hypovolemia: Secondary | ICD-10-CM | POA: Diagnosis present

## 2020-02-25 DIAGNOSIS — N051 Unspecified nephritic syndrome with focal and segmental glomerular lesions: Secondary | ICD-10-CM | POA: Diagnosis not present

## 2020-02-25 DIAGNOSIS — Z9049 Acquired absence of other specified parts of digestive tract: Secondary | ICD-10-CM

## 2020-02-25 DIAGNOSIS — R21 Rash and other nonspecific skin eruption: Secondary | ICD-10-CM | POA: Diagnosis not present

## 2020-02-25 DIAGNOSIS — Z87441 Personal history of nephrotic syndrome: Secondary | ICD-10-CM

## 2020-02-25 DIAGNOSIS — Z79899 Other long term (current) drug therapy: Secondary | ICD-10-CM | POA: Diagnosis not present

## 2020-02-25 DIAGNOSIS — N99840 Postprocedural hematoma of a genitourinary system organ or structure following a genitourinary system procedure: Secondary | ICD-10-CM | POA: Diagnosis not present

## 2020-02-25 DIAGNOSIS — Z28310 Unvaccinated for covid-19: Secondary | ICD-10-CM | POA: Diagnosis present

## 2020-02-25 DIAGNOSIS — N178 Other acute kidney failure: Secondary | ICD-10-CM | POA: Diagnosis not present

## 2020-02-25 DIAGNOSIS — R509 Fever, unspecified: Secondary | ICD-10-CM | POA: Diagnosis not present

## 2020-02-25 DIAGNOSIS — R6520 Severe sepsis without septic shock: Secondary | ICD-10-CM | POA: Diagnosis not present

## 2020-02-25 DIAGNOSIS — R768 Other specified abnormal immunological findings in serum: Secondary | ICD-10-CM | POA: Diagnosis present

## 2020-02-25 DIAGNOSIS — N2581 Secondary hyperparathyroidism of renal origin: Secondary | ICD-10-CM | POA: Diagnosis present

## 2020-02-25 DIAGNOSIS — N186 End stage renal disease: Secondary | ICD-10-CM | POA: Diagnosis present

## 2020-02-25 DIAGNOSIS — G4089 Other seizures: Secondary | ICD-10-CM | POA: Diagnosis not present

## 2020-02-25 DIAGNOSIS — I469 Cardiac arrest, cause unspecified: Secondary | ICD-10-CM | POA: Diagnosis not present

## 2020-02-25 DIAGNOSIS — N19 Unspecified kidney failure: Secondary | ICD-10-CM | POA: Diagnosis present

## 2020-02-25 DIAGNOSIS — N170 Acute kidney failure with tubular necrosis: Secondary | ICD-10-CM | POA: Diagnosis not present

## 2020-02-25 DIAGNOSIS — T4275XA Adverse effect of unspecified antiepileptic and sedative-hypnotic drugs, initial encounter: Secondary | ICD-10-CM | POA: Diagnosis not present

## 2020-02-25 DIAGNOSIS — Z992 Dependence on renal dialysis: Secondary | ICD-10-CM

## 2020-02-25 DIAGNOSIS — N200 Calculus of kidney: Secondary | ICD-10-CM | POA: Diagnosis present

## 2020-02-25 DIAGNOSIS — K047 Periapical abscess without sinus: Secondary | ICD-10-CM | POA: Diagnosis present

## 2020-02-25 DIAGNOSIS — R0989 Other specified symptoms and signs involving the circulatory and respiratory systems: Secondary | ICD-10-CM | POA: Diagnosis not present

## 2020-02-25 DIAGNOSIS — E8809 Other disorders of plasma-protein metabolism, not elsewhere classified: Secondary | ICD-10-CM | POA: Diagnosis present

## 2020-02-25 DIAGNOSIS — E872 Acidosis: Secondary | ICD-10-CM | POA: Diagnosis present

## 2020-02-25 DIAGNOSIS — F419 Anxiety disorder, unspecified: Secondary | ICD-10-CM | POA: Diagnosis present

## 2020-02-25 DIAGNOSIS — D631 Anemia in chronic kidney disease: Secondary | ICD-10-CM | POA: Diagnosis present

## 2020-02-25 DIAGNOSIS — Z8616 Personal history of COVID-19: Secondary | ICD-10-CM

## 2020-02-25 DIAGNOSIS — K5900 Constipation, unspecified: Secondary | ICD-10-CM | POA: Diagnosis present

## 2020-02-25 LAB — RETICULOCYTES
Immature Retic Fract: 8.1 % (ref 2.3–15.9)
RBC.: 2.25 MIL/uL — ABNORMAL LOW (ref 3.87–5.11)
Retic Count, Absolute: 31.7 10*3/uL (ref 19.0–186.0)
Retic Ct Pct: 1.4 % (ref 0.4–3.1)

## 2020-02-25 LAB — VITAMIN B12: Vitamin B-12: 950 pg/mL — ABNORMAL HIGH (ref 180–914)

## 2020-02-25 LAB — CBC WITH DIFFERENTIAL/PLATELET
Abs Immature Granulocytes: 0.14 10*3/uL — ABNORMAL HIGH (ref 0.00–0.07)
Basophils Absolute: 0 10*3/uL (ref 0.0–0.1)
Basophils Relative: 1 %
Eosinophils Absolute: 0 10*3/uL (ref 0.0–0.5)
Eosinophils Relative: 0 %
HCT: 19.9 % — ABNORMAL LOW (ref 36.0–46.0)
Hemoglobin: 6.3 g/dL — CL (ref 12.0–15.0)
Immature Granulocytes: 2 %
Lymphocytes Relative: 5 %
Lymphs Abs: 0.4 10*3/uL — ABNORMAL LOW (ref 0.7–4.0)
MCH: 27.3 pg (ref 26.0–34.0)
MCHC: 31.7 g/dL (ref 30.0–36.0)
MCV: 86.1 fL (ref 80.0–100.0)
Monocytes Absolute: 1 10*3/uL (ref 0.1–1.0)
Monocytes Relative: 12 %
Neutro Abs: 7 10*3/uL (ref 1.7–7.7)
Neutrophils Relative %: 80 %
Platelets: 282 10*3/uL (ref 150–400)
RBC: 2.31 MIL/uL — ABNORMAL LOW (ref 3.87–5.11)
RDW: 16.2 % — ABNORMAL HIGH (ref 11.5–15.5)
WBC: 8.6 10*3/uL (ref 4.0–10.5)
nRBC: 0 % (ref 0.0–0.2)

## 2020-02-25 LAB — COMPREHENSIVE METABOLIC PANEL
ALT: 35 U/L (ref 0–44)
AST: 26 U/L (ref 15–41)
Albumin: 2.1 g/dL — ABNORMAL LOW (ref 3.5–5.0)
Alkaline Phosphatase: 50 U/L (ref 38–126)
Anion gap: 22 — ABNORMAL HIGH (ref 5–15)
BUN: 144 mg/dL — ABNORMAL HIGH (ref 8–23)
CO2: 9 mmol/L — ABNORMAL LOW (ref 22–32)
Calcium: 6.8 mg/dL — ABNORMAL LOW (ref 8.9–10.3)
Chloride: 99 mmol/L (ref 98–111)
Creatinine, Ser: 29.98 mg/dL — ABNORMAL HIGH (ref 0.44–1.00)
GFR, Estimated: 1 mL/min — ABNORMAL LOW (ref >60)
Glucose, Bld: 97 mg/dL (ref 70–99)
Potassium: 5.4 mmol/L — ABNORMAL HIGH (ref 3.5–5.1)
Sodium: 130 mmol/L — ABNORMAL LOW (ref 135–145)
Total Bilirubin: 0.5 mg/dL (ref 0.3–1.2)
Total Protein: 7.8 g/dL (ref 6.5–8.1)

## 2020-02-25 LAB — URINALYSIS, ROUTINE W REFLEX MICROSCOPIC
Bilirubin Urine: NEGATIVE
Glucose, UA: 50 mg/dL — AB
Ketones, ur: 5 mg/dL — AB
Leukocytes,Ua: NEGATIVE
Nitrite: NEGATIVE
Protein, ur: >=300 mg/dL — AB
Specific Gravity, Urine: 1.024 (ref 1.005–1.030)
pH: 6 (ref 5.0–8.0)

## 2020-02-25 LAB — IRON AND TIBC
Iron: 38 ug/dL (ref 28–170)
Saturation Ratios: 24 % (ref 10.4–31.8)
TIBC: 158 ug/dL — ABNORMAL LOW (ref 250–450)
UIBC: 120 ug/dL

## 2020-02-25 LAB — PREPARE RBC (CROSSMATCH)

## 2020-02-25 LAB — RESP PANEL BY RT-PCR (FLU A&B, COVID) ARPGX2
Influenza A by PCR: NEGATIVE
Influenza B by PCR: NEGATIVE
SARS Coronavirus 2 by RT PCR: NEGATIVE

## 2020-02-25 LAB — LIPASE, BLOOD: Lipase: 71 U/L — ABNORMAL HIGH (ref 11–51)

## 2020-02-25 LAB — PHOSPHORUS: Phosphorus: >30 mg/dL — ABNORMAL HIGH (ref 2.5–4.6)

## 2020-02-25 LAB — ABO/RH: ABO/RH(D): O POS

## 2020-02-25 LAB — FERRITIN: Ferritin: 620 ng/mL — ABNORMAL HIGH (ref 11–307)

## 2020-02-25 LAB — FOLATE: Folate: 14.8 ng/mL (ref >5.9)

## 2020-02-25 LAB — BRAIN NATRIURETIC PEPTIDE: B Natriuretic Peptide: 72.1 pg/mL (ref 0.0–100.0)

## 2020-02-25 IMAGING — US US RENAL
1 series · 14 of 25 positions shown · non-contrast
Comparison: CT [DATE]

CLINICAL DATA: Acute kidney injury

EXAM:
RENAL / URINARY TRACT ULTRASOUND COMPLETE

[Series 1: us renal · 14 of 42 slices shown]
[im 1/42]
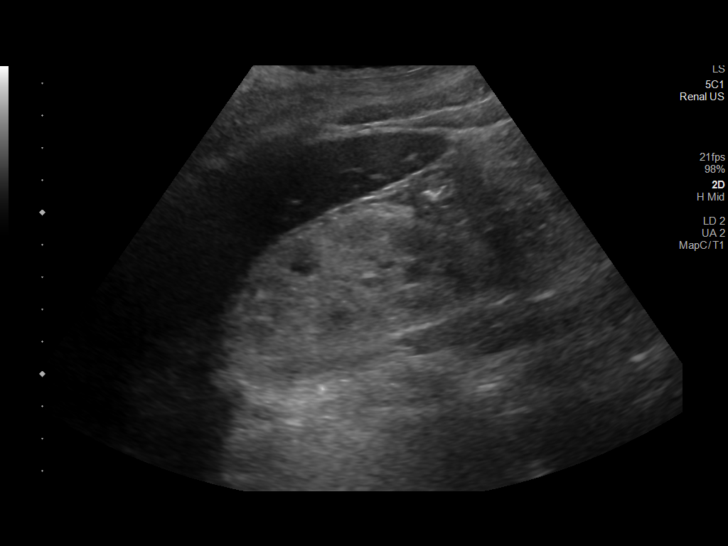
[im 4/42]
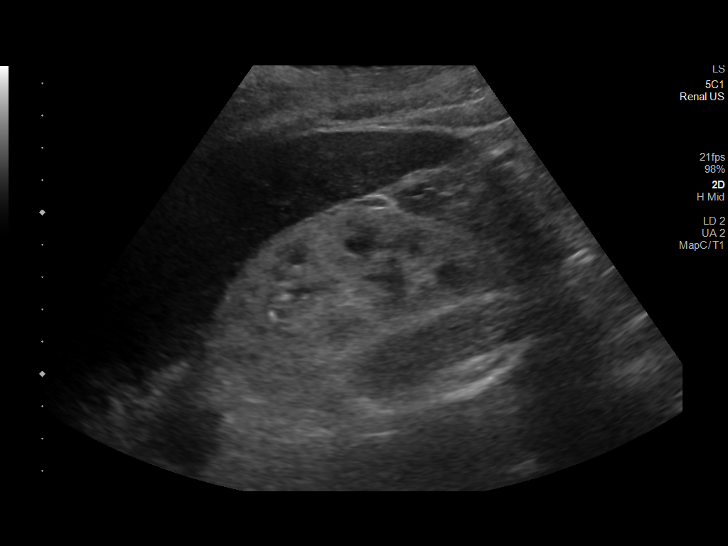
[im 7/42]
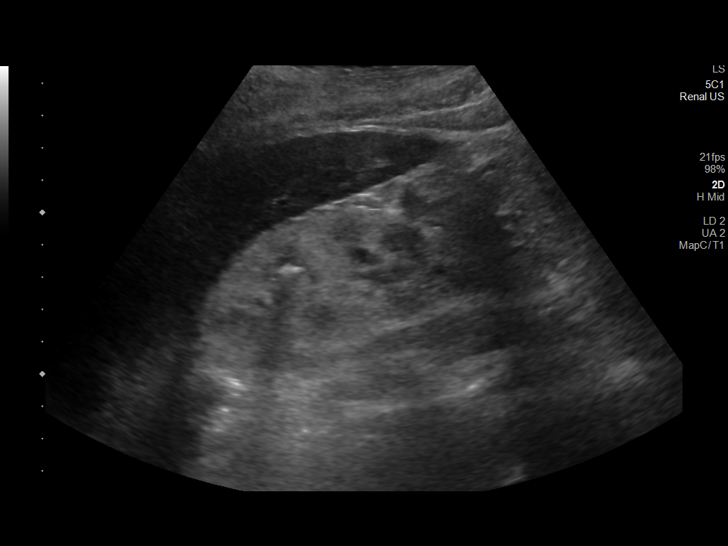
[im 11/42]
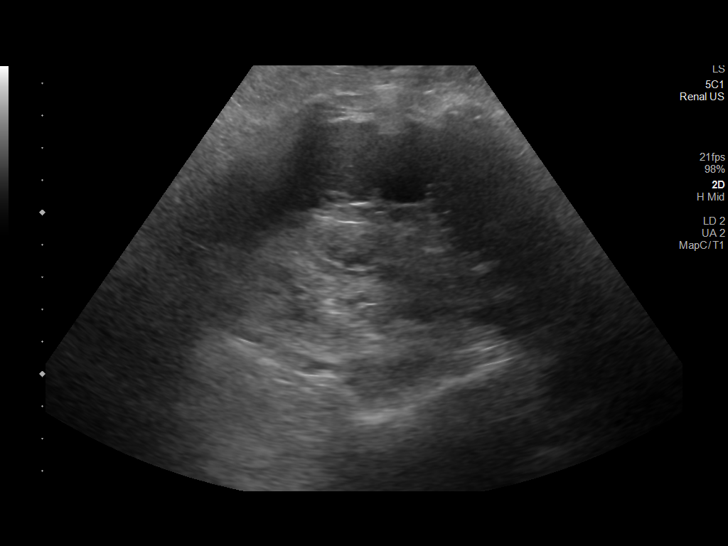
[im 14/42]
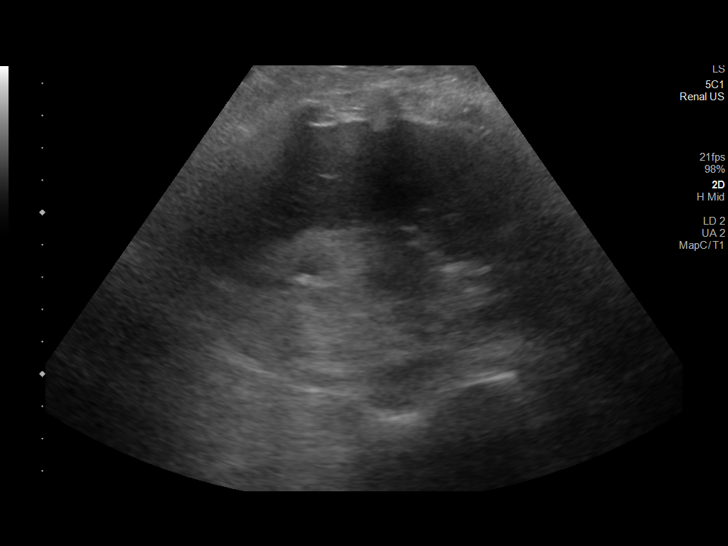
[im 16/42]
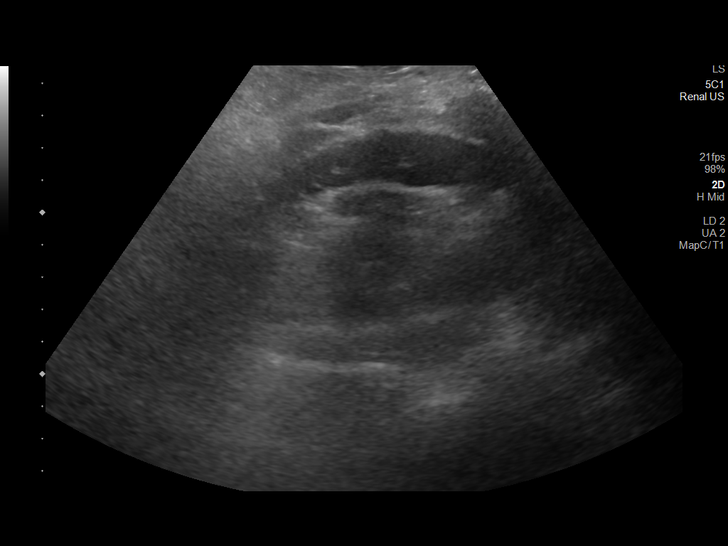
[im 19/42]
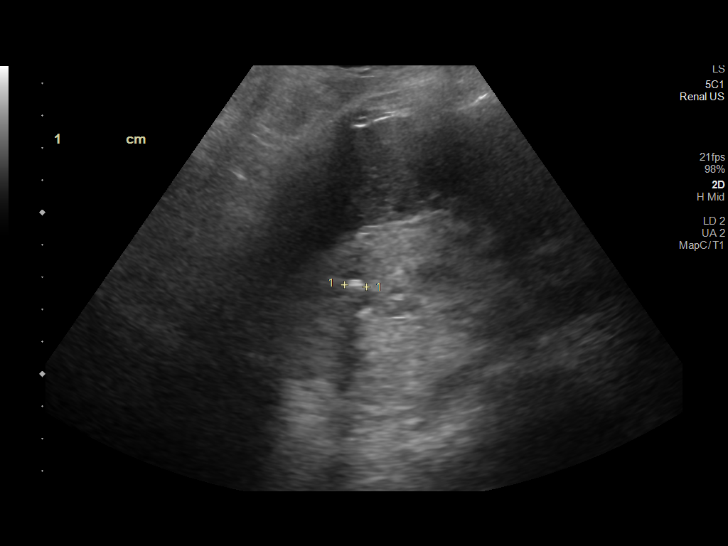
[im 23/42]
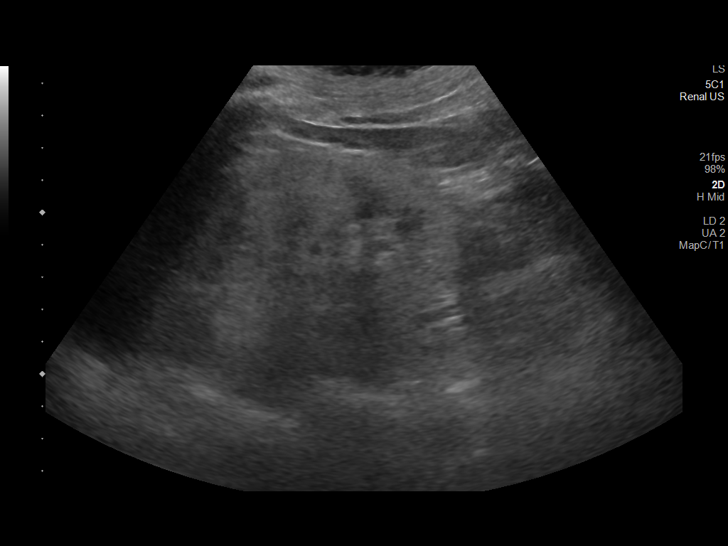
[im 26/42]
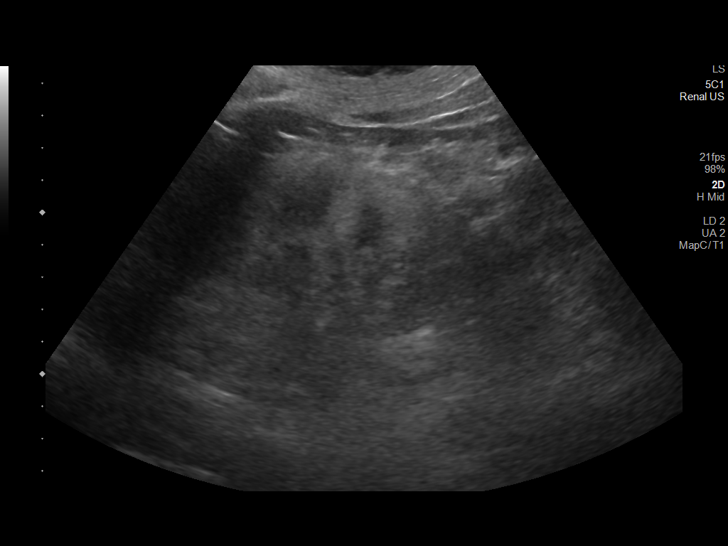
[im 28/42]
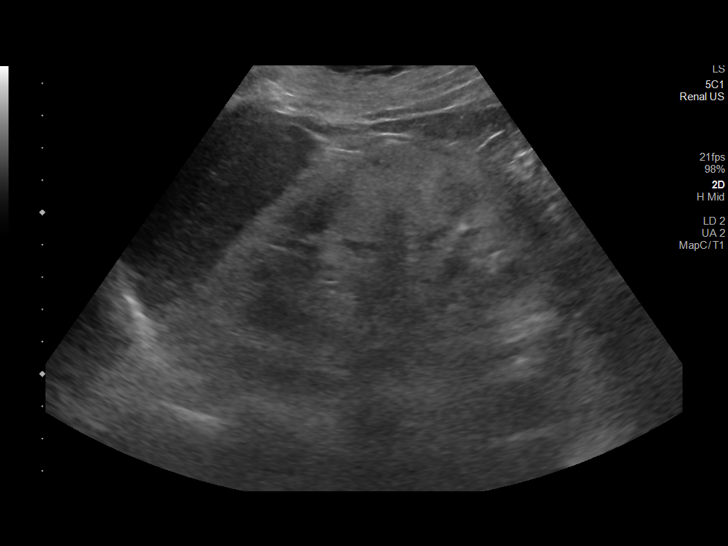
[im 31/42]
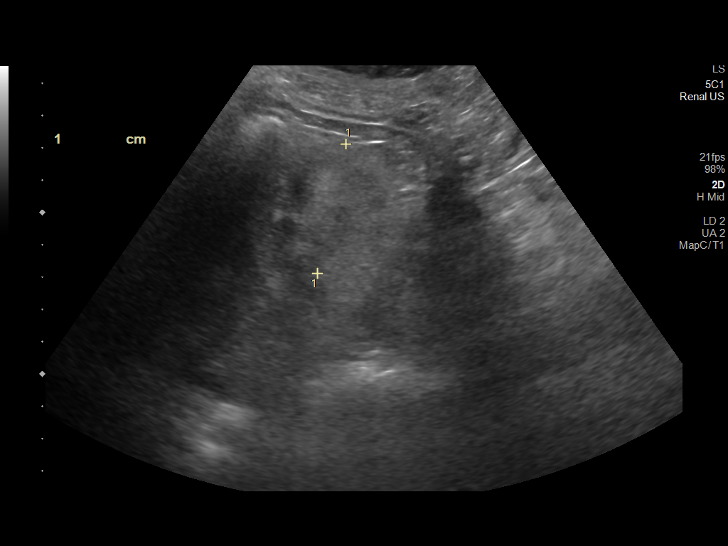
[im 35/42]
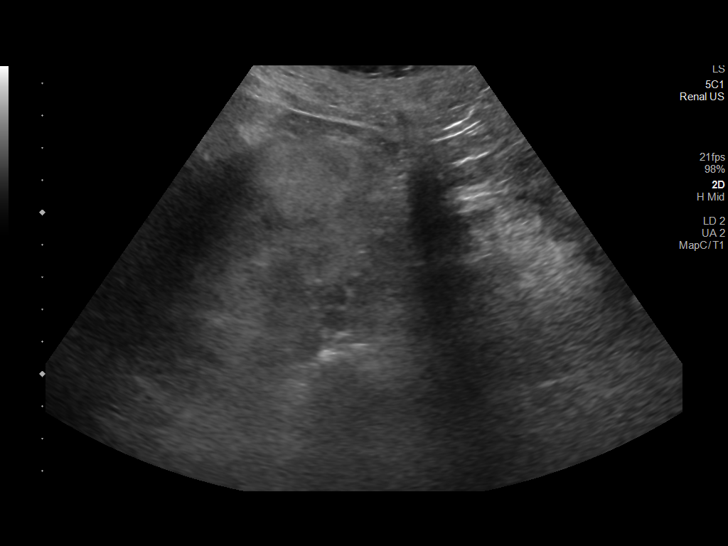
[im 38/42]
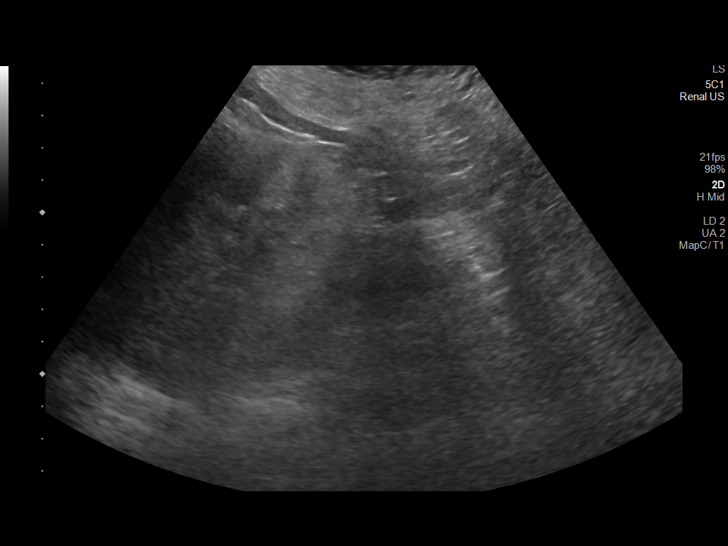
[im 42/42]
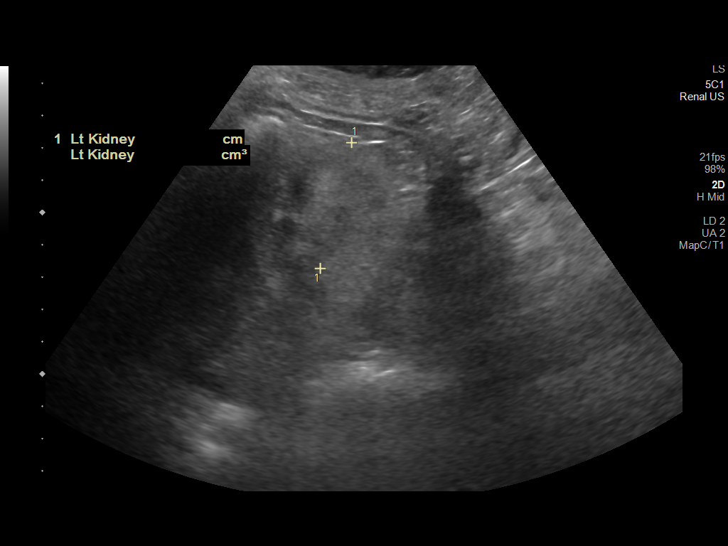

[14 of 25 positions shown; findings below may reference images not displayed]

FINDINGS: Right Kidney:

Renal measurements: 9.7 x 4.2 x 4.7 cm = volume: 99.6 mL. Diffusely
increased renal cortical echogenicity. 9 mm shadowing calculus seen
in the interpolar right kidney. Corresponds well to the finding on
CT. No hydronephrosis or concerning renal mass.

Left Kidney:

Renal measurements: 11.1 x 5.9 x 4.0 cm = volume: 136 mL. Diffusely
increased renal cortical echogenicity. No concerning renal mass,
shadowing calculus or hydronephrosis.

Bladder:

Appears normal for degree of bladder distention.

Other:

None.
IMPRESSION: 1. Bilaterally increased renal cortical echogenicity compatible with
medical renal disease.
2. 9 mm nonobstructive calculus in the interpolar right kidney.

## 2020-02-25 IMAGING — CT CT RENAL STONE PROTOCOL
2 of 4 series · 17 of 46 positions shown, 19 images · non-contrast
Comparison: None.

CLINICAL DATA: Flank pain for the past week.

EXAM:
CT ABDOMEN AND PELVIS WITHOUT CONTRAST
TECHNIQUE: Multidetector CT imaging of the abdomen and pelvis was performed
following the standard protocol without IV contrast.

[Series 3: stone study 5.0 i30f 2 · axial · 0.88mm/px · z∈[+369,+739]mm · 14 of 82 slices shown, 16 images]
[im 4/82  soft-tissue]
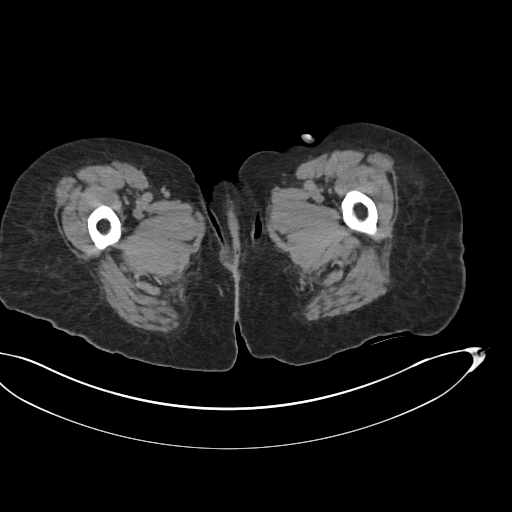
[im 4/82  bone]
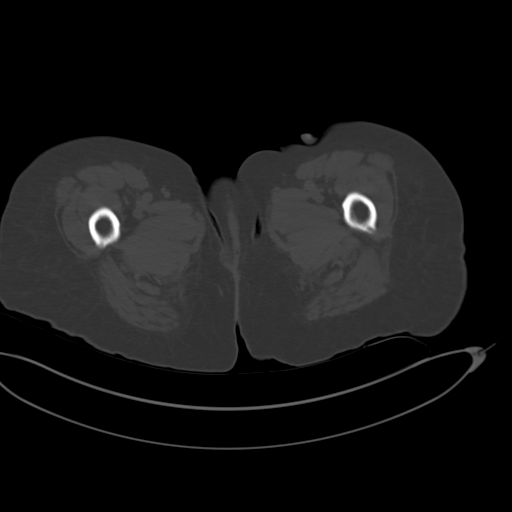
[im 10/82  soft-tissue]
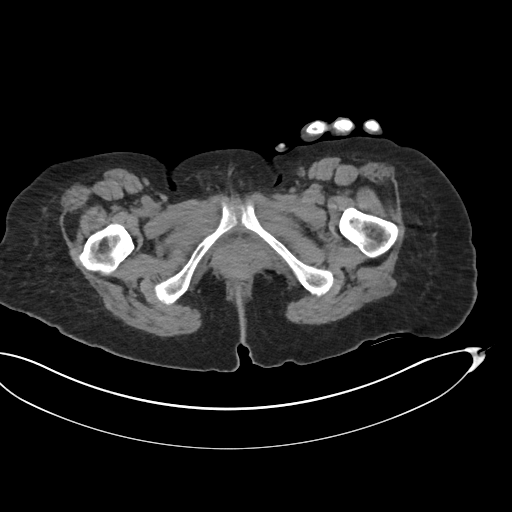
[im 17/82  soft-tissue]
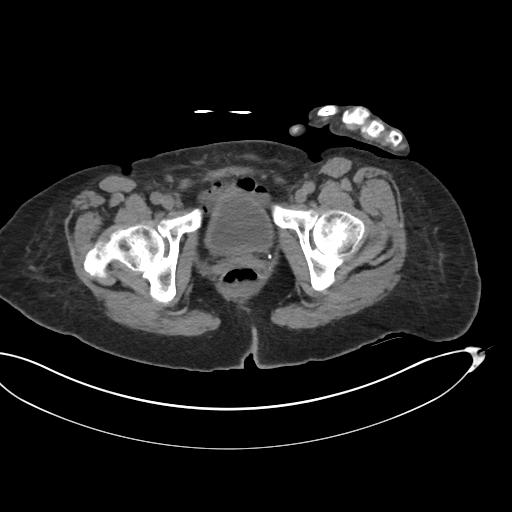
[im 23/82  soft-tissue]
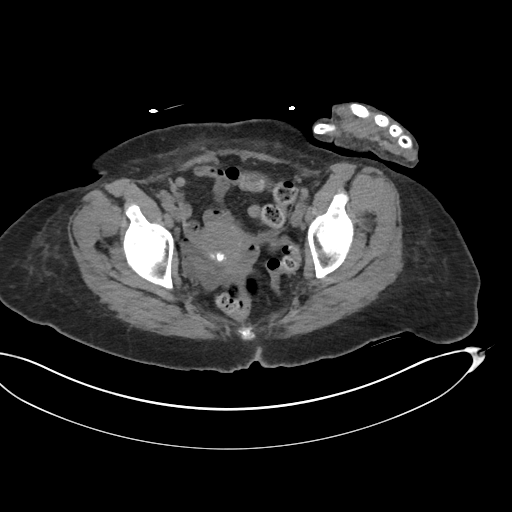
[im 26/82  soft-tissue]
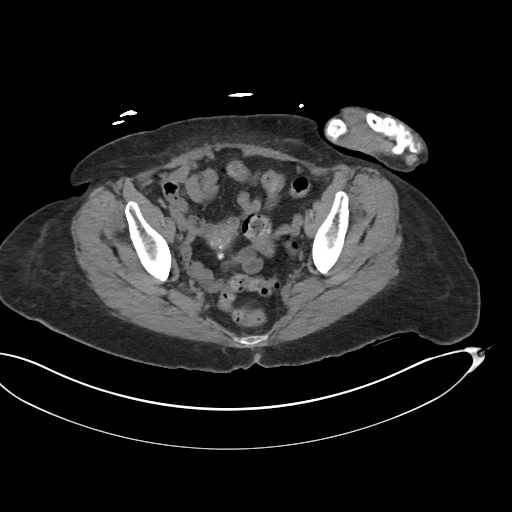
[im 33/82  soft-tissue]
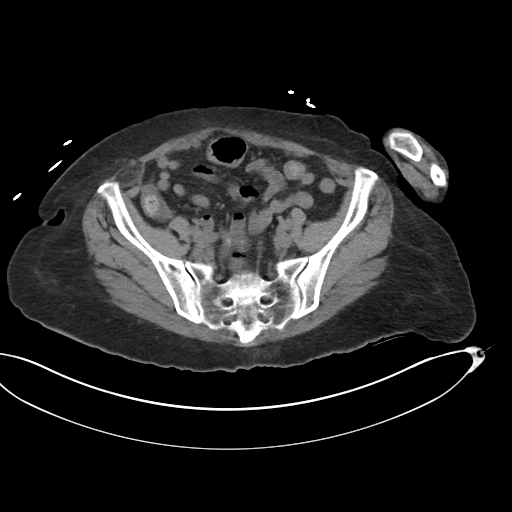
[im 39/82  soft-tissue]
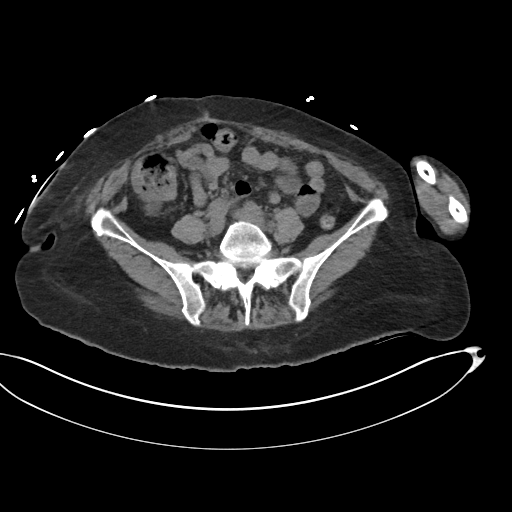
[im 43/82  soft-tissue]
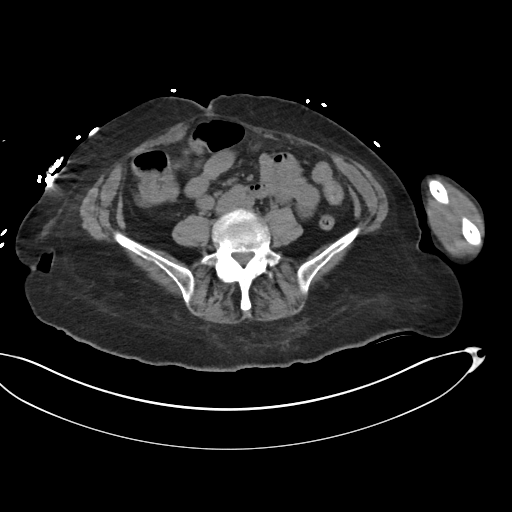
[im 49/82  soft-tissue]
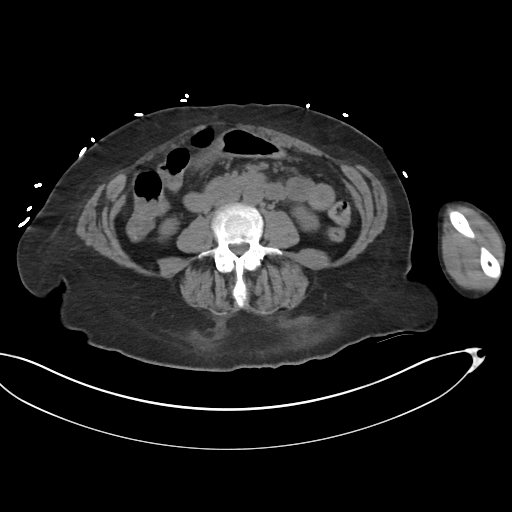
[im 49/82  bone]
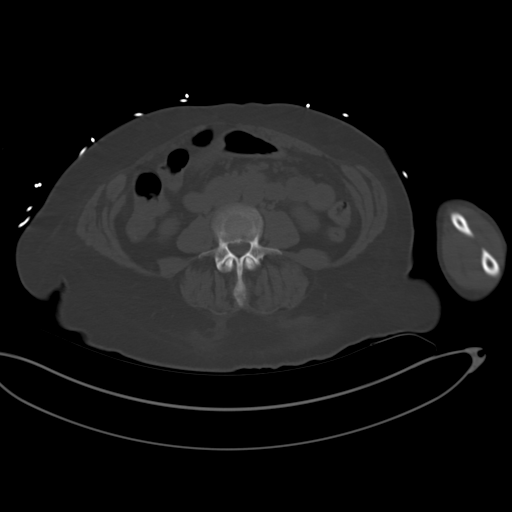
[im 56/82  soft-tissue]
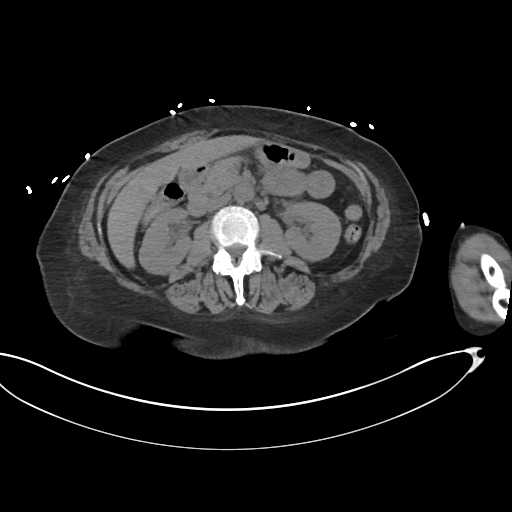
[im 62/82  soft-tissue]
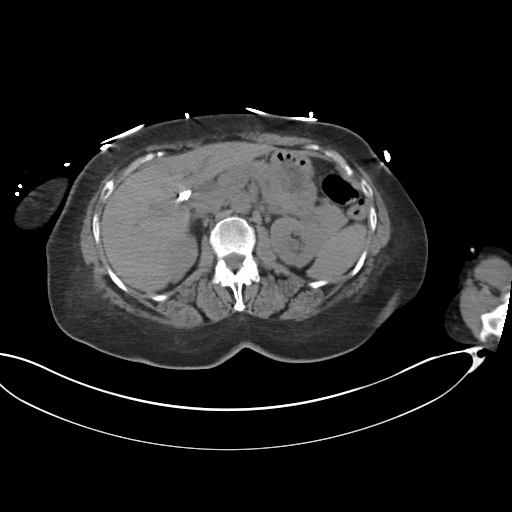
[im 65/82  soft-tissue]
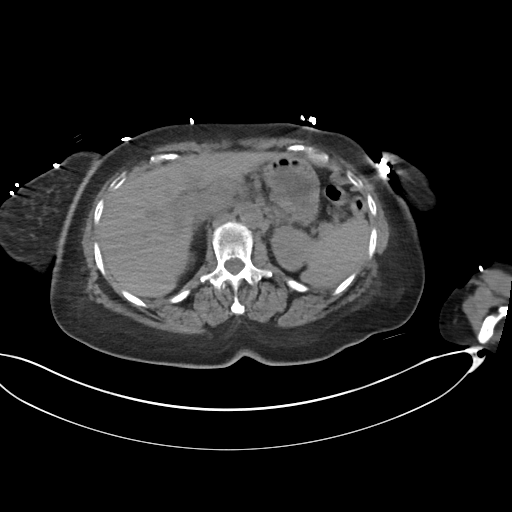
[im 72/82  soft-tissue]
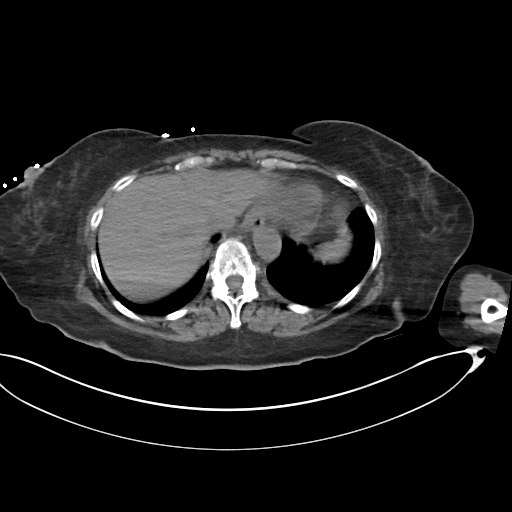
[im 78/82  soft-tissue]
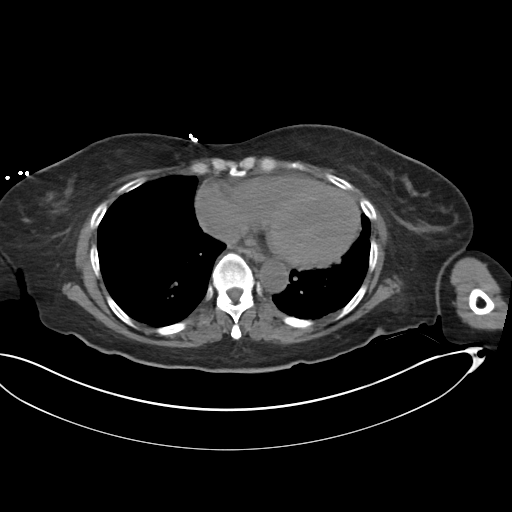

[Series 6: coronal soft tissue · coronal · 0.82mm/px · 3 of 98 slices shown]
[im 33/98  soft-tissue]
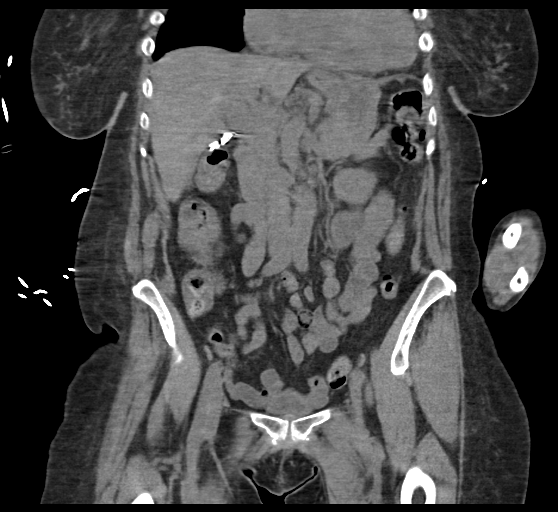
[im 44/98  soft-tissue]
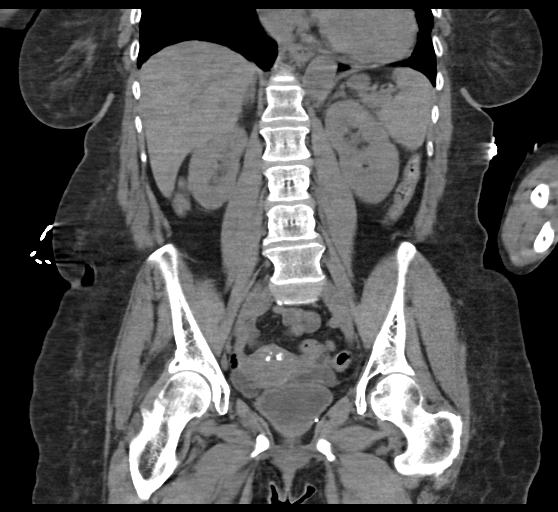
[im 54/98  soft-tissue]
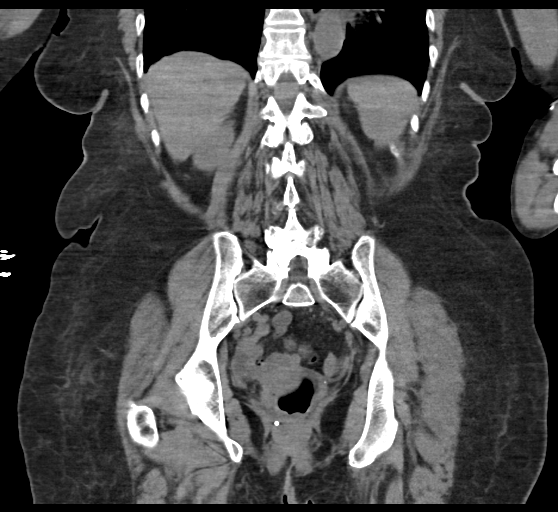

[17 of 46 positions shown; findings below may reference images not displayed]

FINDINGS: Lower chest: No acute abnormality.

Hepatobiliary: No focal liver abnormality is seen. Status post
cholecystectomy. No biliary dilatation.

Pancreas: Unremarkable. No pancreatic ductal dilatation or
surrounding inflammatory changes.

Spleen: Normal in size without focal abnormality.

Adrenals/Urinary Tract: Adrenal glands and left kidney are
unremarkable. 6 mm nonobstructive right renal calculus. No
hydronephrosis. The bladder is unremarkable for the degree of
distention.

Stomach/Bowel: Stomach is within normal limits. Diminutive or absent
appendix. No evidence of bowel wall thickening, distention, or
inflammatory changes.

Vascular/Lymphatic: No significant vascular findings are present. No
enlarged abdominal or pelvic lymph nodes.

Reproductive: Small calcified uterine fibroids.  No adnexal mass.

Other: No abdominal wall hernia or abnormality. No abdominopelvic
ascites. No pneumoperitoneum.

Musculoskeletal: No acute or significant osseous findings.
IMPRESSION: 1. No acute intra-abdominal process.
2. Nonobstructive right nephrolithiasis.

## 2020-02-25 MED ORDER — CHLORHEXIDINE GLUCONATE CLOTH 2 % EX PADS
6.0000 | MEDICATED_PAD | Freq: Every day | CUTANEOUS | Status: DC
  Administered 2020-02-27: 6 via TOPICAL

## 2020-02-25 MED ORDER — ACETAMINOPHEN 650 MG RE SUPP: 650.0000 mg | Freq: Four times a day (QID) | RECTAL | Status: DC | PRN

## 2020-02-25 MED ORDER — SODIUM ZIRCONIUM CYCLOSILICATE 5 G PO PACK
5.0000 g | PACK | Freq: Once | ORAL | Status: AC
  Administered 2020-02-26: 5 g via ORAL
  Filled 2020-02-25: qty 1

## 2020-02-25 MED ORDER — ONDANSETRON HCL 4 MG/2ML IJ SOLN
4.0000 mg | Freq: Four times a day (QID) | INTRAMUSCULAR | Status: DC | PRN
Start: 2020-02-25 — End: 2020-02-27

## 2020-02-25 MED ORDER — FENTANYL CITRATE (PF) 100 MCG/2ML IJ SOLN
50.0000 ug | Freq: Once | INTRAMUSCULAR | Status: AC
  Administered 2020-02-25: 50 ug via INTRAVENOUS
  Filled 2020-02-25: qty 2

## 2020-02-25 MED ORDER — ACETAMINOPHEN 325 MG PO TABS
650.0000 mg | ORAL_TABLET | Freq: Four times a day (QID) | ORAL | Status: DC | PRN
  Administered 2020-02-26: 650 mg via ORAL
  Filled 2020-02-25: qty 2

## 2020-02-25 MED ORDER — SEVELAMER CARBONATE 800 MG PO TABS
800.0000 mg | ORAL_TABLET | Freq: Three times a day (TID) | ORAL | Status: DC
  Administered 2020-02-26: 800 mg via ORAL
  Filled 2020-02-25 (×3): qty 1

## 2020-02-25 MED ORDER — ONDANSETRON HCL 4 MG PO TABS
4.0000 mg | ORAL_TABLET | Freq: Four times a day (QID) | ORAL | Status: DC | PRN
Start: 2020-02-25 — End: 2020-02-27

## 2020-02-25 MED ORDER — NIFEDIPINE ER OSMOTIC RELEASE 30 MG PO TB24
30.0000 mg | ORAL_TABLET | Freq: Every day | ORAL | Status: DC
  Filled 2020-02-25 (×3): qty 1

## 2020-02-25 MED ORDER — SODIUM CHLORIDE 0.9% IV SOLUTION: Freq: Once | INTRAVENOUS | Status: DC

## 2020-02-25 MED ORDER — SODIUM BICARBONATE 8.4 % IV SOLN
INTRAVENOUS | Status: DC
  Filled 2020-02-25 (×2): qty 850

## 2020-02-25 MED ORDER — ONDANSETRON HCL 4 MG/2ML IJ SOLN
4.0000 mg | Freq: Once | INTRAMUSCULAR | Status: AC
  Administered 2020-02-25: 4 mg via INTRAVENOUS
  Filled 2020-02-25: qty 2

## 2020-02-25 MED ORDER — HYDRALAZINE HCL 10 MG PO TABS
10.0000 mg | ORAL_TABLET | Freq: Three times a day (TID) | ORAL | Status: DC
  Administered 2020-02-26 – 2020-02-27 (×3): 10 mg via ORAL
  Filled 2020-02-25 (×3): qty 1

## 2020-02-25 MED ORDER — NIFEDIPINE ER OSMOTIC RELEASE 90 MG PO TB24
90.0000 mg | ORAL_TABLET | Freq: Every morning | ORAL | Status: DC
  Administered 2020-02-26: 90 mg via ORAL
  Filled 2020-02-25: qty 1

## 2020-02-25 NOTE — ED Notes (Signed)
Returned from CT.

## 2020-02-25 NOTE — ED Notes (Signed)
Critical lab value - Hgb 6.3 further reported to [place], [place]

## 2020-02-25 NOTE — Consult Note (Signed)
[facility]  INPATIENT CONSULTATION  Reason for Consultation: AKI Requesting Provider: Dr. Alcario Drought  HPI: Barbara Crawford is an 65 y.o. female with HTN who is seen for evaluation and management of AKI.    She had COVID + in 01/2020 (unvaccinated), had 1 syncopal episode during [place], recovered at home and had some fatigue but was better enough to return to work and generally back to baseline.   Started having low back pain and decreased voiding for about month, worse for a week.  Urine character is normal per her but daughter thinks urine is cloudy and malodorous.   Using topical aspercreme and bengay for back with some help.  Poor appetite, more sleepy lately.  No emesis, no hiccups. +pruritis but thinks dry skin.  No confusion.   Hydrocodone/APAP given urgent care yesterday; also takes nifedipine, lisinopril.  APAP only, no NSAIDs.  No h/o kidney stones.  Wt up and down lately but usually 150lbs.  LE edema x 1 week.  Back pain worse today and LE swelling worse so came into [facility] ED where labs showing Hb 6s, WBC 8.6, Plt 282, Na 130, K 5.4, bicarb 9, BUN 144, Cr 30, Ca 6.8, Alb 2.1, LFTs ok, Phos > 30.  CT renal stone prot showing L 7m nonobstructing nephrolith, R kidney ok.   PCP referred her to nephrology - has outpt apt in 03/2020. Says abnormal kidney function and anemia but those labs aren't available.  She's not sure if urine was sent.  No fevers, chills.  No eye issues, no oral ulcers.  No bleeding.  Constipation lately.  Has a tooth infection x 1 year - couldn't afford extraction so it's been ongoing.   No family h/o ESRD or CKD.    PMH: Past Medical History:  Diagnosis Date  . Hypertension    PSH: No past surgical history on file.  Past Medical History:  Diagnosis Date  . Hypertension     Medications:  I have reviewed the patient's current medications.  (Not in a hospital admission)   ALLERGIES:   Allergies  Allergen Reactions  . Erythromycin  Other (See Comments)    Pt does not recall  . Iodine Other (See Comments)    Arm flares up per pt  . Penicillins     Has patient had a PCN reaction causing immediate rash, facial/tongue/throat swelling, SOB or lightheadedness with hypotension: no Has patient had a PCN reaction causing severe rash involving mucus membranes or skin necrosis: yes Has patient had a PCN reaction that required hospitalization: no Has patient had a PCN reaction occurring within the last 10 years: no If all of the above answers are "NO", then may proceed with Cephalosporin use.  .[employer]     FAM HX: No family history on file.  Social History:   reports that she has never smoked. She does not have any smokeless tobacco history on file. She reports current alcohol use. She reports that she does not use drugs.  ROS: 12 system ROS neg except per HPI  Blood pressure (!) 148/79, pulse 97, temperature 98.3 F (36.8 C), temperature source Rectal, resp. rate 20, height '5\' 3"'$  (1.6 m), weight 68 kg, SpO2 100 %. PHYSICAL EXAM: Gen: tired but nontoxic on stretcher  Eyes:  EOMI, anicteric ENT: MM tacky, fair dentition Neck: supple, no JVD CV:  RRR, no rub or S4 Abd: soft, nontender Back: difficulty sitting up due to low back pain - just above gluteus mainly but  into flanks some; seems worse with palpation of muscles GU: purewick with mid yellow urine which is foamy right after voiding Extr: 2+ pitting LE edema Neuro: some coarse tremor noted, slow to answer some questions Skin: no rashes or lesions noted   Results for orders placed or performed during the hospital encounter of 02/25/20 (from the past 48 hour(s))  CBC with Differential     Status: Abnormal   Collection Time: 02/25/20  5:53 PM  Result Value Ref Range   WBC 8.6 4.0 - 10.5 K/uL   RBC 2.31 (L) 3.87 - 5.11 MIL/uL   Hemoglobin 6.3 (LL) 12.0 - 15.0 g/dL    Comment: REPEATED TO VERIFY THIS CRITICAL RESULT HAS VERIFIED AND BEEN CALLED  TO C MAS RN BY KYUNG BAEK ON 02 22 2022 AT 1859, AND HAS BEEN READ BACK.     HCT 19.9 (L) 36.0 - 46.0 %   MCV 86.1 80.0 - 100.0 fL   MCH 27.3 26.0 - 34.0 pg   MCHC 31.7 30.0 - 36.0 g/dL   RDW 16.2 (H) 11.5 - 15.5 %   Platelets 282 150 - 400 K/uL   nRBC 0.0 0.0 - 0.2 %   Neutrophils Relative % 80 %   Neutro Abs 7.0 1.7 - 7.7 K/uL   Lymphocytes Relative 5 %   Lymphs Abs 0.4 (L) 0.7 - 4.0 K/uL   Monocytes Relative 12 %   Monocytes Absolute 1.0 0.1 - 1.0 K/uL   Eosinophils Relative 0 %   Eosinophils Absolute 0.0 0.0 - 0.5 K/uL   Basophils Relative 1 %   Basophils Absolute 0.0 0.0 - 0.1 K/uL   Immature Granulocytes 2 %   Abs Immature Granulocytes 0.14 (H) 0.00 - 0.07 K/uL    Comment: Performed at [facility] [address] [place], [place] [address]  Comprehensive metabolic panel     Status: Abnormal   Collection Time: 02/25/20  5:53 PM  Result Value Ref Range   Sodium 130 (L) 135 - 145 mmol/L   Potassium 5.4 (H) 3.5 - 5.1 mmol/L   Chloride 99 98 - 111 mmol/L   CO2 9 (L) 22 - 32 mmol/L   Glucose, Bld 97 70 - 99 mg/dL    Comment: Glucose reference range applies only to samples taken after fasting for at least 8 hours.   BUN 144 (H) 8 - 23 mg/dL   Creatinine, Ser 29.98 (H) 0.44 - 1.00 mg/dL    Comment: RESULTS CONFIRMED BY MANUAL DILUTION   Calcium 6.8 (L) 8.9 - 10.3 mg/dL   Total Protein 7.8 6.5 - 8.1 g/dL   Albumin 2.1 (L) 3.5 - 5.0 g/dL   AST 26 15 - 41 U/L   ALT 35 0 - 44 U/L   Alkaline Phosphatase 50 38 - 126 U/L   Total Bilirubin 0.5 0.3 - 1.2 mg/dL   GFR, Estimated 1 (L) >60 mL/min    Comment: (NOTE) Calculated using the CKD-EPI Creatinine Equation (2021)    Anion gap 22 (H) 5 - 15    Comment: Performed at [facility] [address], [place], [place] [address]  Lipase, blood     Status: Abnormal   Collection Time: 02/25/20  5:53 PM  Result Value Ref Range   Lipase 71 (H) 11 - 51 U/L    Comment: Performed at [facility] [address],  [place], [place] [address]  Resp Panel by RT-PCR (Flu A&B, Covid) Nasopharyngeal Swab  Status: None   Collection Time: 02/25/20  6:12 PM   Specimen: Nasopharyngeal Swab; Nasopharyngeal(NP) swabs in vial transport medium  Result Value Ref Range   SARS Coronavirus 2 by RT PCR NEGATIVE NEGATIVE    Comment: (NOTE) SARS-CoV-2 target nucleic acids are NOT DETECTED.  The SARS-CoV-2 RNA is generally detectable in upper respiratory specimens during the acute phase of infection. The lowest concentration of SARS-CoV-2 viral copies this assay can detect is 138 copies/mL. A negative result does not preclude SARS-Cov-2 infection and should not be used as the sole basis for treatment or other patient management decisions. A negative result may occur with  improper specimen collection/handling, submission of specimen other than nasopharyngeal swab, presence of viral mutation(s) within the areas targeted by this assay, and inadequate number of viral copies(<138 copies/mL). A negative result must be combined with clinical observations, patient history, and epidemiological information. The expected result is Negative.  Fact Sheet for Patients:  EntrepreneurPulse.com.au  Fact Sheet for Healthcare Providers:  IncredibleEmployment.be  This test is no t yet approved or cleared by the [place] FDA and  has been authorized for detection and/or diagnosis of SARS-CoV-2 by FDA under an Emergency Use Authorization (EUA). This EUA will remain  in effect (meaning this test can be used) for the duration of the COVID-19 declaration under Section 564(b)(1) of the Act, 21 U.S.C.section 360bbb-3(b)(1), unless the authorization is terminated  or revoked sooner.       Influenza A by PCR NEGATIVE NEGATIVE   Influenza B by PCR NEGATIVE NEGATIVE    Comment: (NOTE) The Xpert Xpress SARS-CoV-2/FLU/RSV plus assay is intended as an aid in the diagnosis of influenza from  Nasopharyngeal swab specimens and should not be used as a sole basis for treatment. Nasal washings and aspirates are unacceptable for Xpert Xpress SARS-CoV-2/FLU/RSV testing.  Fact Sheet for Patients: EntrepreneurPulse.com.au  Fact Sheet for Healthcare Providers: IncredibleEmployment.be  This test is not yet approved or cleared by the [place] FDA and has been authorized for detection and/or diagnosis of SARS-CoV-2 by FDA under an Emergency Use Authorization (EUA). This EUA will remain in effect (meaning this test can be used) for the duration of the COVID-19 declaration under Section 564(b)(1) of the Act, 21 U.S.C. section 360bbb-3(b)(1), unless the authorization is terminated or revoked.  Performed at [facility], [place] [address], [place], [place] [address]     CT Renal Stone Study  Result Date: 02/25/2020 CLINICAL DATA:  Flank pain for the past week. EXAM: CT ABDOMEN AND PELVIS WITHOUT CONTRAST TECHNIQUE: Multidetector CT imaging of the abdomen and pelvis was performed following the standard protocol without IV contrast. COMPARISON:  None. FINDINGS: Lower chest: No acute abnormality. Hepatobiliary: No focal liver abnormality is seen. Status post cholecystectomy. No biliary dilatation. Pancreas: Unremarkable. No pancreatic ductal dilatation or surrounding inflammatory changes. Spleen: Normal in size without focal abnormality. Adrenals/Urinary Tract: Adrenal glands and left kidney are unremarkable. 6 mm nonobstructive right renal calculus. No hydronephrosis. The bladder is unremarkable for the degree of distention. Stomach/Bowel: Stomach is within normal limits. Diminutive or absent appendix. No evidence of bowel wall thickening, distention, or inflammatory changes. Vascular/Lymphatic: No significant vascular findings are present. No enlarged abdominal or pelvic lymph nodes. Reproductive: Small calcified uterine fibroids.  No adnexal mass.  Other: No abdominal wall hernia or abnormality. No abdominopelvic ascites. No pneumoperitoneum. Musculoskeletal: No acute or significant osseous findings. IMPRESSION: 1. No acute intra-abdominal process. 2. Nonobstructive right nephrolithiasis. Electronically Signed   By: Orville Govern.D.  On: 02/25/2020 19:39    Assessment/Plan **AKI:  Appears was brewing in 12/2019 with PCP referring her to nephrology then - would like to obtain labs tomorrow when available (pt didn't have copy or portal access tonight).  Certainly wide differential at this point -- need UA and will send broad serologies now.  Renal US to assess cortical thickness and feasibility of biopsy which may be needed for diagnosis.   NPOpMN and HD catheter ordered; will plan for 1st dialysis tomorrow for uremic symptoms unless labs dramatically improve overnight which I don't expect.   Hold ACEi for now.  Strict I/Os, daily weights.    Will need diuresis/ultrafiltration in the coming days.   **HTN:  Hold ACEi in setting of AKI.  On home nifedipine.  Start hydralazine 10 TID.   **Anemia:  Hb in 6s.  Set up blood but hold on transfusion overnight with volume needed for bicarb drip.  Will plan to give with dialysis tomorrow unless urgent need arises prior.  Iron studies pending. Check myeloma labs.  Hemoccult.  **AGMA:  Presume secondary to AKI; start isotonic bicarb gtt.    **Hyperkalemia:  Lokelma 5g now.  Follow.  **Hyperphosphatemia:  Phos reported > 30; check uric acid but doubt TLS.  Recheck in AM, plan for dialysis and can start binder.   **Hypocalcemia:  Corrects to 8.4.  Follow.  **Back pain:  Seems out of proportion to small stone; AKI generally not painful.  Seems MSK in origin.  W/u and treatment per primary.   D/w patient, daughter and hospitalist.  Justin Mend 02/25/2020, 8:36 PM

## 2020-02-25 NOTE — H&P (Signed)
History and Physical    Barbara Crawford NOI:[id number] DOB: 07/16/1955 DOA: 02/25/2020  PCP: Vonna Drafts, FNP  Patient coming from: Home  I have personally briefly reviewed patient's old medical records in [place]  Chief Complaint: Flank pain  HPI: Barbara Crawford is a 65 y.o. female with medical history significant of HTN.  Pt had COVID in Dec, at that time her PCP told her she had a slight decrease in her kidney function.  She hasnt felt 100% since the COVID illness.  About 1 week ago she developed swelling in BLE.  Developed pain in B lower back, worse on L side.  Seen 2 days ago at [facility], given hydrocodone for pain.  Pain is 10/10, nothing makes better or worse.  Has severe malaise and fatigue.  No orthopnea.  Severe generalized weakness.  No fever, night sweats, difficulty urinating.  No hematochezia nor melena nor bleeding source.  No NSAID use.   ED Course: BP 148/79.  HGB 6.3, K 5.4, sodium 130, bicarb 9, BUN 144, creat 29.98.  CT abd/pelvis - no obstruction or acute abnormality.   Review of Systems: As per HPI, otherwise all review of systems negative.  Past Medical History:  Diagnosis Date  . Hypertension     No past surgical history on file.   reports that she has never smoked. She does not have any smokeless tobacco history on file. She reports current alcohol use. She reports that she does not use drugs.  Allergies  Allergen Reactions  . Erythromycin Other (See Comments)    Pt does not recall  . Iodine Other (See Comments)    Arm flares up per pt  . Penicillins     Has patient had a PCN reaction causing immediate rash, facial/tongue/throat swelling, SOB or lightheadedness with hypotension: no Has patient had a PCN reaction causing severe rash involving mucus membranes or skin necrosis: yes Has patient had a PCN reaction that required hospitalization: no Has patient had a PCN reaction occurring within the last 10 years:  no If all of the above answers are "NO", then may proceed with Cephalosporin use.  Marland [employer]     No family history on file. No recent sick contacts.  Prior to Admission medications   Medication Sig Start Date End Date Taking? Authorizing Provider  acetaminophen (TYLENOL) 650 MG CR tablet Take 650 mg by mouth as needed for pain.   Yes [provider]  aspirin EC 81 MG tablet Take 81 mg by mouth daily.   Yes [provider]  HYDROcodone-acetaminophen (NORCO/VICODIN) 5-325 MG tablet Take 1 tablet by mouth 3 (three) times daily as needed for severe pain. 02/23/20  Yes [provider]  lisinopril (ZESTRIL) 5 MG tablet Take 5 mg by mouth daily. 02/24/20  Yes [provider]  NIFEdipine (ADALAT CC) 30 MG 24 hr tablet Take 30 mg by mouth at bedtime.   Yes [provider]  NIFEdipine (ADALAT CC) 90 MG 24 hr tablet Take 90 mg by mouth every morning. 01/11/16  Yes [provider]    Physical Exam: Vitals:   02/25/20 1854 02/25/20 1900 02/25/20 1930 02/25/20 2015  BP:  (!) 158/78 (!) 155/92 (!) 148/79  Pulse:  99 (!) 103 97  Resp:  (!) 21 (!) 24 20  Temp: 98.3 F (36.8 C)     TempSrc: Rectal     SpO2:  100% 100% 100%  Weight:      Height:  Constitutional: NAD, calm, comfortable Eyes: PERRL, lids and conjunctivae normal ENMT: Mucous membranes are moist. Posterior pharynx clear of any exudate or lesions.Normal dentition.  Neck: normal, supple, no masses, no thyromegaly Respiratory: clear to auscultation bilaterally, no wheezing, no crackles. Normal respiratory effort. No accessory muscle use.  Cardiovascular: Regular rate and rhythm, no murmurs / rubs / gallops. 4+ edema / anasarca. 2+ pedal pulses. No carotid bruits.  Abdomen: no tenderness, no masses palpated. No hepatosplenomegaly. Bowel sounds positive.  Musculoskeletal: no clubbing / cyanosis. No joint deformity upper and lower extremities. Good ROM, no  contractures. Normal muscle tone.  Skin: no rashes, lesions, ulcers. No induration Neurologic: CN 2-12 grossly intact. Sensation intact, DTR normal. Strength 5/5 in all 4.  Psychiatric: Normal judgment and insight. Alert and oriented x 3. Normal mood.    Labs on Admission: I have personally reviewed following labs and imaging studies  CBC: Recent Labs  Lab 02/25/20 1753  WBC 8.6  NEUTROABS 7.0  HGB 6.3*  HCT 19.9*  MCV 86.1  PLT 812   Basic Metabolic Panel: Recent Labs  Lab 02/25/20 1753  NA 130*  K 5.4*  CL 99  CO2 9*  GLUCOSE 97  BUN 144*  CREATININE 29.98*  CALCIUM 6.8*   GFR: Estimated Creatinine Clearance: 1.8 mL/min (A) (by C-G formula based on SCr of 29.98 mg/dL (H)). Liver Function Tests: Recent Labs  Lab 02/25/20 1753  AST 26  ALT 35  ALKPHOS 50  BILITOT 0.5  PROT 7.8  ALBUMIN 2.1*   Recent Labs  Lab 02/25/20 1753  LIPASE 71*   No results for input(s): AMMONIA in the last 168 hours. Coagulation Profile: No results for input(s): INR, PROTIME in the last 168 hours. Cardiac Enzymes: No results for input(s): CKTOTAL, CKMB, CKMBINDEX, TROPONINI in the last 168 hours. BNP (last 3 results) No results for input(s): PROBNP in the last 8760 hours. HbA1C: No results for input(s): HGBA1C in the last 72 hours. CBG: No results for input(s): GLUCAP in the last 168 hours. Lipid Profile: No results for input(s): CHOL, HDL, LDLCALC, TRIG, CHOLHDL, LDLDIRECT in the last 72 hours. Thyroid Function Tests: No results for input(s): TSH, T4TOTAL, FREET4, T3FREE, THYROIDAB in the last 72 hours. Anemia Panel: No results for input(s): VITAMINB12, FOLATE, FERRITIN, TIBC, IRON, RETICCTPCT in the last 72 hours. Urine analysis:    Component Value Date/Time   COLORURINE YELLOW 01/28/2016 1750   APPEARANCEUR CLEAR 01/28/2016 1750   LABSPEC 1.012 01/28/2016 1750   PHURINE 5.0 01/28/2016 1750   GLUCOSEU NEGATIVE 01/28/2016 1750   HGBUR MODERATE (A) 01/28/2016 1750    BILIRUBINUR NEGATIVE 01/28/2016 1750   KETONESUR 20 (A) 01/28/2016 1750   PROTEINUR 30 (A) 01/28/2016 1750   NITRITE NEGATIVE 01/28/2016 1750   LEUKOCYTESUR NEGATIVE 01/28/2016 1750    Radiological Exams on Admission: CT Renal Stone Study  Result Date: 02/25/2020 CLINICAL DATA:  Flank pain for the past week. EXAM: CT ABDOMEN AND PELVIS WITHOUT CONTRAST TECHNIQUE: Multidetector CT imaging of the abdomen and pelvis was performed following the standard protocol without IV contrast. COMPARISON:  None. FINDINGS: Lower chest: No acute abnormality. Hepatobiliary: No focal liver abnormality is seen. Status post cholecystectomy. No biliary dilatation. Pancreas: Unremarkable. No pancreatic ductal dilatation or surrounding inflammatory changes. Spleen: Normal in size without focal abnormality. Adrenals/Urinary Tract: Adrenal glands and left kidney are unremarkable. 6 mm nonobstructive right renal calculus. No hydronephrosis. The bladder is unremarkable for the degree of distention. Stomach/Bowel: Stomach is within normal limits. Diminutive or absent appendix.  No evidence of bowel wall thickening, distention, or inflammatory changes. Vascular/Lymphatic: No significant vascular findings are present. No enlarged abdominal or pelvic lymph nodes. Reproductive: Small calcified uterine fibroids.  No adnexal mass. Other: No abdominal wall hernia or abnormality. No abdominopelvic ascites. No pneumoperitoneum. Musculoskeletal: No acute or significant osseous findings. IMPRESSION: 1. No acute intra-abdominal process. 2. Nonobstructive right nephrolithiasis. Electronically Signed   By: Titus Dubin M.D.   On: 02/25/2020 19:39    EKG: Independently reviewed.  Assessment/Plan Principal Problem:   Acute kidney failure (HCC) Active Problems:   Anemia   Uremia   HTN (hypertension)    1. AKF - 1. Dr. Johnney Ou seeing pt now 2. Check ESR, CRP, d.dimer 3. Check BNP 4. Check UA 5. Tele monitor 6. Repeat CBC/BMP in  AM 2. Anemia - 1. Check anemia pnl 2. May just be due to kidney failure, especially if failure has been ongonig since Dec COVID. 3. Type and screen 4. Will see if Dr. Johnney Ou wants transfusion tonight, or to hold off due to risk of fluid overload 3. HTN - 1. Cont nifedipine 2. Hold lisinopril  DVT prophylaxis: SCDs Code Status: Full Family Communication: Family at bedside Disposition Plan: Home after AKF addressed Consults called: Dr. Johnney Ou Admission status: Admit to inpatient  Severity of Illness: The appropriate patient status for this patient is INPATIENT. Inpatient status is judged to be reasonable and necessary in order to provide the required intensity of service to ensure the patient's safety. The patient's presenting symptoms, physical exam findings, and initial radiographic and laboratory data in the context of their chronic comorbidities is felt to place them at high risk for further clinical deterioration. Furthermore, it is not anticipated that the patient will be medically stable for discharge from the hospital within 2 midnights of admission. The following factors support the patient status of inpatient.   IP status due to: 1) AKF with creat of 29, BUN 144 2) Anemia with HGB 6.3   * I certify that at the point of admission it is my clinical judgment that the patient will require inpatient hospital care spanning beyond 2 midnights from the point of admission due to high intensity of service, high risk for further deterioration and high frequency of surveillance required.*    GARDNER, JARED M. DO [facility]  How to contact the [facility] Attending or Consulting provider [place] or covering provider during after hours [place], for this patient?  1. Check the care team in [facility] and look for a) attending/consulting [facility] provider listed and b) the [facility] team listed 2. Log into [email or web address]  Am[employer] Physician Scheduling and messaging for groups and whole hospitals  On call and  physician scheduling software for group practices, residents, hospitalists and other medical providers for call, clinic, rotation and shift schedules. On[employer] is a hospital-wide system for scheduling doctors and paging doctors on call. EasyPlot is for scientific plotting and data analysis.  [email or web address]  and use 's universal password to access. If you do not have the password, please contact the hospital operator.  3. Locate the [facility] provider you are looking for under [facility] and page to a number that you can be directly reached. 4. If you still have difficulty reaching the provider, please page the [facility] (Director on Call) for the Hospitalists listed on amion for assistance.  02/25/2020, 8:41 PM

## 2020-02-25 NOTE — ED Notes (Signed)
I stat chem 8 ran unsuccessfully.   Pt's creatinine was not at a level the machine could read. Verified by thorough mixing of sample and running it a second time.  Provider made aware and took a copy of the values that did read.

## 2020-02-25 NOTE — ED Notes (Signed)
Transported to CT 

## 2020-02-25 NOTE — ED Notes (Signed)
Patient transported to US 

## 2020-02-25 NOTE — ED Provider Notes (Signed)
[place] EMERGENCY DEPARTMENT Provider Note   CSN: [id number] Arrival date & time: 02/25/20  1733     History Chief Complaint  Patient presents with  . Flank Pain    Barbara Crawford is a 65 y.o. female who presents emergency department with chief complaint of flank pain and lower extremity edema. Patient states that about a week ago she developed swelling in her lower extremities. She developed pain in her bilateral lower back worse on the left side. She was seen 2 days ago at an urgent care and was diagnosed with strain of the muscles of the lower back and given hydrocodone for pain and swelling in her lower extremities. She denies orthopnea or PND. She has had severe malaise and fatigue and states that she has been sleeping upright in a chair not because she cannot breathe when lying flat, but because she has been too weak to stand up out of her bed. She states that she did have Covid back in December and saw her primary care doctor who told her at that time that she had a slight decrease in her kidney function . She denies soaking night sweats, loss of weight, she does not endorse having difficulty urinating. She rates her pain at 10 out of 10, nothing seems to make it worse or better.  HPI     Past Medical History:  Diagnosis Date  . Hypertension     Patient Active Problem List   Diagnosis Date Noted  . Acute kidney failure ([place]) 02/25/2020  . Anemia 02/25/2020  . Uremia 02/25/2020  . HTN (hypertension) 02/25/2020    No past surgical history on file.   OB History   No obstetric history on file.     No family history on file.  Social History   Tobacco Use  . Smoking status: Never Smoker  Substance Use Topics  . Alcohol use: Yes  . Drug use: No    Home Medications Prior to Admission medications   Medication Sig Start Date End Date Taking? Authorizing Provider  acetaminophen (TYLENOL) 650 MG CR tablet Take 650 mg by mouth as needed for  pain.   Yes [provider]  aspirin EC 81 MG tablet Take 81 mg by mouth daily.   Yes [provider]  HYDROcodone-acetaminophen (NORCO/VICODIN) 5-325 MG tablet Take 1 tablet by mouth 3 (three) times daily as needed for severe pain. 02/23/20  Yes [provider]  lisinopril (ZESTRIL) 5 MG tablet Take 5 mg by mouth daily. 02/24/20  Yes [provider]  NIFEdipine (ADALAT CC) 30 MG 24 hr tablet Take 30 mg by mouth at bedtime.   Yes [provider]  NIFEdipine (ADALAT CC) 90 MG 24 hr tablet Take 90 mg by mouth every morning. 01/11/16  Yes [provider]    Allergies    Erythromycin, Iodine, Penicillins, and Shellfish-derived products  Review of Systems   Review of Systems Ten systems reviewed and are negative for acute change, except as noted in the HPI.   Physical Exam Updated Vital Signs BP (!) 142/81   Pulse (!) 108   Temp 98.3 F (36.8 C) (Rectal)   Resp (!) 22   Ht '5\' 3"'$  (1.6 m)   Wt 68 kg   SpO2 99%   BMI 26.57 kg/m   Physical Exam Vitals and nursing note reviewed.  Constitutional:      General: She is not in acute distress.    Appearance: She is well-developed and  well-nourished. She is not diaphoretic.     Comments: Appears weak   HENT:     Head: Normocephalic and atraumatic.  Eyes:     General: No scleral icterus.    Conjunctiva/sclera: Conjunctivae normal.  Cardiovascular:     Rate and Rhythm: Normal rate and regular rhythm.     Heart sounds: Normal heart sounds. No murmur heard. No friction rub. No gallop.      Comments: Anasarca of the lower extremities Pulmonary:     Effort: Pulmonary effort is normal. No respiratory distress.     Breath sounds: Normal breath sounds.  Abdominal:     General: Bowel sounds are normal. There is no distension.     Palpations: Abdomen is soft. There is no mass.     Tenderness: There is no abdominal tenderness. There is right CVA tenderness. There is no guarding.   Musculoskeletal:     Cervical back: Normal range of motion.     Right lower leg: 4+ Pitting Edema present.     Left lower leg: 4+ Pitting Edema present.  Skin:    General: Skin is warm and dry.  Neurological:     Mental Status: She is alert and oriented to person, place, and time.  Psychiatric:        Behavior: Behavior normal.     ED Results / Procedures / Treatments   Labs (all labs ordered are listed, but only abnormal results are displayed) Labs Reviewed  CBC WITH DIFFERENTIAL/PLATELET - Abnormal; Notable for the following components:      Result Value   RBC 2.31 (*)    Hemoglobin 6.3 (*)    HCT 19.9 (*)    RDW 16.2 (*)    Lymphs Abs 0.4 (*)    Abs Immature Granulocytes 0.14 (*)    All other components within normal limits  COMPREHENSIVE METABOLIC PANEL - Abnormal; Notable for the following components:   Sodium 130 (*)    Potassium 5.4 (*)    CO2 9 (*)    BUN 144 (*)    Creatinine, Ser 29.98 (*)    Calcium 6.8 (*)    Albumin 2.1 (*)    GFR, Estimated 1 (*)    Anion gap 22 (*)    All other components within normal limits  LIPASE, BLOOD - Abnormal; Notable for the following components:   Lipase 71 (*)    All other components within normal limits  URINALYSIS, ROUTINE W REFLEX MICROSCOPIC - Abnormal; Notable for the following components:   APPearance HAZY (*)    Glucose, UA 50 (*)    Hgb urine dipstick SMALL (*)    Ketones, ur 5 (*)    Protein, ur >=300 (*)    Bacteria, UA RARE (*)    All other components within normal limits  VITAMIN B12 - Abnormal; Notable for the following components:   Vitamin B-12 950 (*)    All other components within normal limits  IRON AND TIBC - Abnormal; Notable for the following components:   TIBC 158 (*)    All other components within normal limits  FERRITIN - Abnormal; Notable for the following components:   Ferritin 620 (*)    All other components within normal limits  RETICULOCYTES - Abnormal; Notable for the following  components:   RBC. 2.25 (*)    All other components within normal limits  PHOSPHORUS - Abnormal; Notable for the following components:   Phosphorus >30.0 (*)    All other components within normal limits  RESP PANEL BY RT-PCR (FLU A&B, COVID) ARPGX2  FOLATE  BRAIN NATRIURETIC PEPTIDE  HIV ANTIBODY (ROUTINE TESTING W REFLEX)  CBC  C-REACTIVE PROTEIN  SEDIMENTATION RATE  D-DIMER, QUANTITATIVE  C3 COMPLEMENT  C4 COMPLEMENT  ANTISTREPTOLYSIN O TITER  ANA W/REFLEX IF POSITIVE  RPR  PROTEIN ELECTROPHORESIS, SERUM  UPEP/UIFE/LIGHT CHAINS/TP, 24-HR UR  KAPPA/LAMBDA LIGHT CHAINS  ANCA TITERS  HEPATITIS B SURFACE ANTIBODY, QUANTITATIVE  HEPATITIS B SURFACE ANTIGEN  HEPATITIS C ANTIBODY  RENAL FUNCTION PANEL  GLOMERULAR BASEMENT MEMBRANE ANTIBODIES  PTH, INTACT AND CALCIUM  PROTEIN / CREATININE RATIO, URINE  URIC ACID  I-STAT CHEM 8, ED  TYPE AND SCREEN  ABO/RH  PREPARE RBC (CROSSMATCH)  TROPONIN I (HIGH SENSITIVITY)      **THE PATIENT WAS ORIGINALLY REGISTERED INCORRECTLY UNDER ANOTHER PATIENT WITH A SIMILAR NAME, HOWEVER THESE ARE THE CORRECT LABS DRAWN ON Barbara Crawford TODAY AT ARRIVAL. IT IS UNABLE TO TRANSFER THESE LABS INTO HER CHART. CONFIRMATORY iSTAT CHEM 8 BELOW**       EKG None  Radiology CT Renal Stone Study  Result Date: 02/25/2020 CLINICAL DATA:  Flank pain for the past week. EXAM: CT ABDOMEN AND PELVIS WITHOUT CONTRAST TECHNIQUE: Multidetector CT imaging of the abdomen and pelvis was performed following the standard protocol without IV contrast. COMPARISON:  None. FINDINGS: Lower chest: No acute abnormality. Hepatobiliary: No focal liver abnormality is seen. Status post cholecystectomy. No biliary dilatation. Pancreas: Unremarkable. No pancreatic ductal dilatation or surrounding inflammatory changes. Spleen: Normal in size without focal abnormality. Adrenals/Urinary Tract: Adrenal glands and left kidney are unremarkable. 6 mm nonobstructive right renal  calculus. No hydronephrosis. The bladder is unremarkable for the degree of distention. Stomach/Bowel: Stomach is within normal limits. Diminutive or absent appendix. No evidence of bowel wall thickening, distention, or inflammatory changes. Vascular/Lymphatic: No significant vascular findings are present. No enlarged abdominal or pelvic lymph nodes. Reproductive: Small calcified uterine fibroids.  No adnexal mass. Other: No abdominal wall hernia or abnormality. No abdominopelvic ascites. No pneumoperitoneum. Musculoskeletal: No acute or significant osseous findings. IMPRESSION: 1. No acute intra-abdominal process. 2. Nonobstructive right nephrolithiasis. Electronically Signed   By: Titus Dubin M.D.   On: 02/25/2020 19:39    Procedures .Critical Care Performed by: Margarita Mail, PA-C Authorized by: Margarita Mail, PA-C   Critical care provider statement:    Critical care time (minutes):  50   Critical care time was exclusive of:  Separately billable procedures and treating other patients   Critical care was necessary to treat or prevent imminent or life-threatening deterioration of the following conditions:  Renal failure   Critical care was time spent personally by me on the following activities:  Discussions with consultants, evaluation of patient's response to treatment, examination of patient, ordering and performing treatments and interventions, ordering and review of laboratory studies, ordering and review of radiographic studies, pulse oximetry, re-evaluation of patient's condition, obtaining history from patient or surrogate and review of old charts     Medications Ordered in ED Medications  NIFEdipine (PROCARDIA XL/NIFEDICAL-XL) 24 hr tablet 90 mg (has no administration in time range)  NIFEdipine (PROCARDIA-XL/NIFEDICAL-XL) 24 hr tablet 30 mg (has no administration in time range)  acetaminophen (TYLENOL) tablet 650 mg (has no administration in time range)    Or  acetaminophen  (TYLENOL) suppository 650 mg (has no administration in time range)  ondansetron (ZOFRAN) tablet 4 mg (has no administration in time range)    Or  ondansetron (ZOFRAN) injection 4 mg (has no administration in time range)  sodium bicarbonate 150 mEq in dextrose 5 % 1,000 mL infusion (has no administration in time range)  sodium zirconium cyclosilicate (LOKELMA) packet 5 g (has no administration in time range)  Chlorhexidine Gluconate Cloth 2 % PADS 6 each (has no administration in time range)  sevelamer carbonate (RENVELA) tablet 800 mg (has no administration in time range)  0.9 %  sodium chloride infusion (Manually program via Guardrails IV Fluids) (has no administration in time range)  hydrALAZINE (APRESOLINE) tablet 10 mg (has no administration in time range)  fentaNYL (SUBLIMAZE) injection 50 mcg (50 mcg Intravenous Given 02/25/20 1822)  ondansetron (ZOFRAN) injection 4 mg (4 mg Intravenous Given 02/25/20 1823)    ED Course  I have reviewed the triage vital signs and the nursing notes.  Pertinent labs & imaging results that were available during my care of the patient were reviewed by me and considered in my medical decision making (see chart for details).  Clinical Course as of 02/25/20 2218  Tue Feb 25, 2020  1753 Bun:CR ration 4.5 suggestive of intrarenal cause. [AH]    Clinical Course User Index [AH] Margarita Mail, PA-C   MDM Rules/Calculators/A&P                          [id number], flank pain VS: BP (!) 142/81   Pulse (!) 108   Temp 98.3 F (36.8 C) (Rectal)   Resp (!) 22   Ht '5\' 3"'$  (1.6 m)   Wt 68 kg   SpO2 99%   BMI 26.57 kg/m   [id number] is gathered by patient and emr. Previous records obtained and reviewed. DDX:The patient's complaint of flank paininvolves an extensive number of diagnostic and treatment options, and is a complaint that carries with it a high risk of complications, morbidity, and potential mortality. Given the large differential diagnosis,  medical decision making is of high complexity.The differential diagnosis of emergent flank pain includes, but is not limited to :Abdominal aortic aneurysm,, Renal artery embolism,Renal vein thrombosis, Aortic dissection, Mesenteric ischemia, Pyelonephritis, Renal infarction, Renal hemorrhage, Nephrolithiasis/ Renal Colic, Bladder tumor,Cystitis, Biliary colic, Pancreatitis Perforated peptic ulcer Appendicitis ,Inguinal Hernia, Diverticulitis, Bowel obstruction  Shingles Lower lobe pneumonia, Retroperitoneal hematoma/abscess/tumor, Epidural abscess, Epidural hematoma  Labs: I ordered reviewed and interpreted labs which included CBC which shows a hemoglobin of 6.3, previous hemoglobin was from 4 years ago showed baseline of 12.6. CMP shows creatinine of 30, BUN of 144, she is hypocalcemic to 6.8, low albumin level, sodium of 130 and potassium of 5.4 she has a anion gap of 22. Urine shows rare bacteria protein is greater than 300. Covid panel is negative.  Imaging: I ordered and reviewed images which included CT of the abdomen and pelvis. I independently visualized and interpreted all imaging. There are no acute, significant findings on today's images. [id number] tachycardia Consults:*Discussed with Dr. Johnney Ou of nephrology who will admit the patient.  I also discussed the case with Dr. Alcario Drought who will admit the patient for acute renal failure. MDM: Patient here with bilateral flank pain, weakness, fatigue.  She has obvious renal failure of unknown etiology.  Urine shows high levels of protein which may indicate a nephrotic syndrome.  Patient is oriented even though she has an anion gap acidosis and a BUN of 144 with a creatinine of 30.  Hemoglobin is low and she will likely need blood transfusion however will need to coordinate with nephrology to find out if she need to be dialyzed first.  I  ordered a PT/INR which is pending.  She is stable throughout her visit here but will need admission for renal  failure. Patient disposition:The patient appears reasonably stabilized for admission considering the current resources, flow, and capabilities available in the ED at this time, and I doubt any other [facility] requiring further screening and/or treatment in the ED prior to admission.        Final Clinical Impression(s) / ED Diagnoses Final diagnoses:  Acute renal failure, unspecified acute renal failure type [facility])    Rx / DC Orders ED Discharge Orders    None       Margarita Mail, PA-C 02/25/20 2233    Truddie Hidden, MD 02/26/20 [phone]

## 2020-02-25 NOTE — ED Triage Notes (Signed)
Pt arrives via EMS from home with complaints of flank pain X1 week. Endorses 10 pain especially when trying to urinate Pt A/O X4

## 2020-02-26 ENCOUNTER — Inpatient Hospital Stay (HOSPITAL_COMMUNITY): Payer: 59

## 2020-02-26 ENCOUNTER — Inpatient Hospital Stay (HOSPITAL_COMMUNITY): Payer: 59 | Admitting: Certified Registered Nurse Anesthetist

## 2020-02-26 DIAGNOSIS — N179 Acute kidney failure, unspecified: Secondary | ICD-10-CM | POA: Diagnosis not present

## 2020-02-26 HISTORY — PX: IR US GUIDE VASC ACCESS RIGHT: IMG2390

## 2020-02-26 HISTORY — PX: IR FLUORO GUIDE CV LINE RIGHT: IMG2283

## 2020-02-26 LAB — HEPATITIS B SURFACE ANTIGEN: Hepatitis B Surface Ag: NONREACTIVE

## 2020-02-26 LAB — GLUCOSE, CAPILLARY
Glucose-Capillary: 113 mg/dL — ABNORMAL HIGH (ref 70–99)
Glucose-Capillary: 135 mg/dL — ABNORMAL HIGH (ref 70–99)
Glucose-Capillary: 122 mg/dL — ABNORMAL HIGH (ref 70–99)
Glucose-Capillary: 96 mg/dL (ref 70–99)
Glucose-Capillary: 74 mg/dL (ref 70–99)

## 2020-02-26 LAB — BLOOD GAS, VENOUS
Acid-base deficit: 18.2 mmol/L — ABNORMAL HIGH (ref 0.0–2.0)
Bicarbonate: 9.8 mmol/L — ABNORMAL LOW (ref 20.0–28.0)
Drawn by: 4956
FIO2: 100
O2 Saturation: 93.1 %
Patient temperature: 37
pCO2, Ven: 34.7 mmHg — ABNORMAL LOW (ref 44.0–60.0)
pH, Ven: 7.08 — CL (ref 7.250–7.430)
pO2, Ven: 97.7 mmHg — ABNORMAL HIGH (ref 32.0–45.0)

## 2020-02-26 LAB — RENAL FUNCTION PANEL
Albumin: 1.6 g/dL — ABNORMAL LOW (ref 3.5–5.0)
Albumin: 1.6 g/dL — ABNORMAL LOW (ref 3.5–5.0)
Anion gap: 22 — ABNORMAL HIGH (ref 5–15)
Anion gap: 25 — ABNORMAL HIGH (ref 5–15)
BUN: 148 mg/dL — ABNORMAL HIGH (ref 8–23)
BUN: 141 mg/dL — ABNORMAL HIGH (ref 8–23)
CO2: 11 mmol/L — ABNORMAL LOW (ref 22–32)
CO2: 10 mmol/L — ABNORMAL LOW (ref 22–32)
Calcium: 6.3 mg/dL — CL (ref 8.9–10.3)
Calcium: 6.1 mg/dL — CL (ref 8.9–10.3)
Chloride: 99 mmol/L (ref 98–111)
Chloride: 97 mmol/L — ABNORMAL LOW (ref 98–111)
Creatinine, Ser: 29.68 mg/dL — ABNORMAL HIGH (ref 0.44–1.00)
Creatinine, Ser: 28.86 mg/dL — ABNORMAL HIGH (ref 0.44–1.00)
GFR, Estimated: 1 mL/min — ABNORMAL LOW (ref >60)
GFR, Estimated: 1 mL/min — ABNORMAL LOW (ref >60)
Glucose, Bld: 119 mg/dL — ABNORMAL HIGH (ref 70–99)
Glucose, Bld: 154 mg/dL — ABNORMAL HIGH (ref 70–99)
Phosphorus: >30 mg/dL — ABNORMAL HIGH (ref 2.5–4.6)
Phosphorus: >30 mg/dL — ABNORMAL HIGH (ref 2.5–4.6)
Potassium: 5.1 mmol/L (ref 3.5–5.1)
Potassium: 4.5 mmol/L (ref 3.5–5.1)
Sodium: 132 mmol/L — ABNORMAL LOW (ref 135–145)
Sodium: 132 mmol/L — ABNORMAL LOW (ref 135–145)

## 2020-02-26 LAB — URIC ACID: Uric Acid, Serum: 9.1 mg/dL — ABNORMAL HIGH (ref 2.5–7.1)

## 2020-02-26 LAB — CBC WITH DIFFERENTIAL/PLATELET
Abs Immature Granulocytes: 0.25 10*3/uL — ABNORMAL HIGH (ref 0.00–0.07)
Basophils Absolute: 0.1 10*3/uL (ref 0.0–0.1)
Basophils Relative: 1 %
Eosinophils Absolute: 0 10*3/uL (ref 0.0–0.5)
Eosinophils Relative: 0 %
HCT: 20.2 % — ABNORMAL LOW (ref 36.0–46.0)
Hemoglobin: 6.4 g/dL — CL (ref 12.0–15.0)
Immature Granulocytes: 2 %
Lymphocytes Relative: 18 %
Lymphs Abs: 1.9 10*3/uL (ref 0.7–4.0)
MCH: 27.1 pg (ref 26.0–34.0)
MCHC: 31.7 g/dL (ref 30.0–36.0)
MCV: 85.6 fL (ref 80.0–100.0)
Monocytes Absolute: 1.3 10*3/uL — ABNORMAL HIGH (ref 0.1–1.0)
Monocytes Relative: 12 %
Neutro Abs: 7.4 10*3/uL (ref 1.7–7.7)
Neutrophils Relative %: 67 %
Platelets: 317 10*3/uL (ref 150–400)
RBC: 2.36 MIL/uL — ABNORMAL LOW (ref 3.87–5.11)
RDW: 16.1 % — ABNORMAL HIGH (ref 11.5–15.5)
WBC: 11 10*3/uL — ABNORMAL HIGH (ref 4.0–10.5)
nRBC: 0 % (ref 0.0–0.2)

## 2020-02-26 LAB — CBC
HCT: 15.5 % — ABNORMAL LOW (ref 36.0–46.0)
Hemoglobin: 5.3 g/dL — CL (ref 12.0–15.0)
MCH: 28.3 pg (ref 26.0–34.0)
MCHC: 34.2 g/dL (ref 30.0–36.0)
MCV: 82.9 fL (ref 80.0–100.0)
Platelets: 221 10*3/uL (ref 150–400)
RBC: 1.87 MIL/uL — ABNORMAL LOW (ref 3.87–5.11)
RDW: 16 % — ABNORMAL HIGH (ref 11.5–15.5)
WBC: 7.5 10*3/uL (ref 4.0–10.5)
nRBC: 0 % (ref 0.0–0.2)

## 2020-02-26 LAB — COMPREHENSIVE METABOLIC PANEL
ALT: 38 U/L (ref 0–44)
AST: 46 U/L — ABNORMAL HIGH (ref 15–41)
Albumin: 1.6 g/dL — ABNORMAL LOW (ref 3.5–5.0)
Alkaline Phosphatase: 49 U/L (ref 38–126)
Anion gap: 28 — ABNORMAL HIGH (ref 5–15)
BUN: 143 mg/dL — ABNORMAL HIGH (ref 8–23)
CO2: 10 mmol/L — ABNORMAL LOW (ref 22–32)
Calcium: 6.5 mg/dL — ABNORMAL LOW (ref 8.9–10.3)
Chloride: 96 mmol/L — ABNORMAL LOW (ref 98–111)
Creatinine, Ser: 30.13 mg/dL — ABNORMAL HIGH (ref 0.44–1.00)
GFR, Estimated: 1 mL/min — ABNORMAL LOW (ref >60)
Glucose, Bld: 150 mg/dL — ABNORMAL HIGH (ref 70–99)
Potassium: 4.4 mmol/L (ref 3.5–5.1)
Sodium: 134 mmol/L — ABNORMAL LOW (ref 135–145)
Total Bilirubin: 0.3 mg/dL (ref 0.3–1.2)
Total Protein: 6.6 g/dL (ref 6.5–8.1)

## 2020-02-26 LAB — POCT I-STAT 7, (LYTES, BLD GAS, ICA,H+H)
Acid-base deficit: 10 mmol/L — ABNORMAL HIGH (ref 0.0–2.0)
Bicarbonate: 14.5 mmol/L — ABNORMAL LOW (ref 20.0–28.0)
Calcium, Ion: 0.73 mmol/L — CL (ref 1.15–1.40)
HCT: 22 % — ABNORMAL LOW (ref 36.0–46.0)
Hemoglobin: 7.5 g/dL — ABNORMAL LOW (ref 12.0–15.0)
O2 Saturation: 100 %
Patient temperature: 98.6
Potassium: 4.8 mmol/L (ref 3.5–5.1)
Sodium: 131 mmol/L — ABNORMAL LOW (ref 135–145)
TCO2: 15 mmol/L — ABNORMAL LOW (ref 22–32)
pCO2 arterial: 25.6 mmHg — ABNORMAL LOW (ref 32.0–48.0)
pH, Arterial: 7.362 (ref 7.350–7.450)
pO2, Arterial: 465 mmHg — ABNORMAL HIGH (ref 83.0–108.0)

## 2020-02-26 LAB — MRSA PCR SCREENING: MRSA by PCR: NEGATIVE

## 2020-02-26 LAB — HIV ANTIBODY (ROUTINE TESTING W REFLEX): HIV Screen 4th Generation wRfx: NONREACTIVE

## 2020-02-26 LAB — RPR: RPR Ser Ql: NONREACTIVE

## 2020-02-26 LAB — TROPONIN I (HIGH SENSITIVITY): Troponin I (High Sensitivity): 52 ng/L — ABNORMAL HIGH (ref <18)

## 2020-02-26 LAB — APTT: aPTT: 29 seconds (ref 24–36)

## 2020-02-26 LAB — SEDIMENTATION RATE: Sed Rate: >140 mm/hr — ABNORMAL HIGH (ref 0–22)

## 2020-02-26 LAB — PROTEIN / CREATININE RATIO, URINE
Creatinine, Urine: 61.04 mg/dL
Protein Creatinine Ratio: 24.98 mg/mg{Cre} — ABNORMAL HIGH (ref 0.00–0.15)
Total Protein, Urine: 1525 mg/dL

## 2020-02-26 LAB — D-DIMER, QUANTITATIVE: D-Dimer, Quant: 3.39 ug/mL-FEU — ABNORMAL HIGH (ref 0.00–0.50)

## 2020-02-26 LAB — HEMOGLOBIN AND HEMATOCRIT, BLOOD
HCT: 24.9 % — ABNORMAL LOW (ref 36.0–46.0)
Hemoglobin: 8.7 g/dL — ABNORMAL LOW (ref 12.0–15.0)

## 2020-02-26 LAB — PROTIME-INR
INR: 1 (ref 0.8–1.2)
Prothrombin Time: 13 seconds (ref 11.4–15.2)

## 2020-02-26 LAB — C-REACTIVE PROTEIN: CRP: 13.4 mg/dL — ABNORMAL HIGH (ref <1.0)

## 2020-02-26 LAB — HEPATITIS C ANTIBODY: HCV Ab: NONREACTIVE

## 2020-02-26 IMAGING — DX DG ABDOMEN 1V
1 series · 1 of 1 positions shown · non-contrast
Comparison: CT [DATE]

CLINICAL DATA: OG tube placement

EXAM:
ABDOMEN - 1 VIEW

[abdomen]
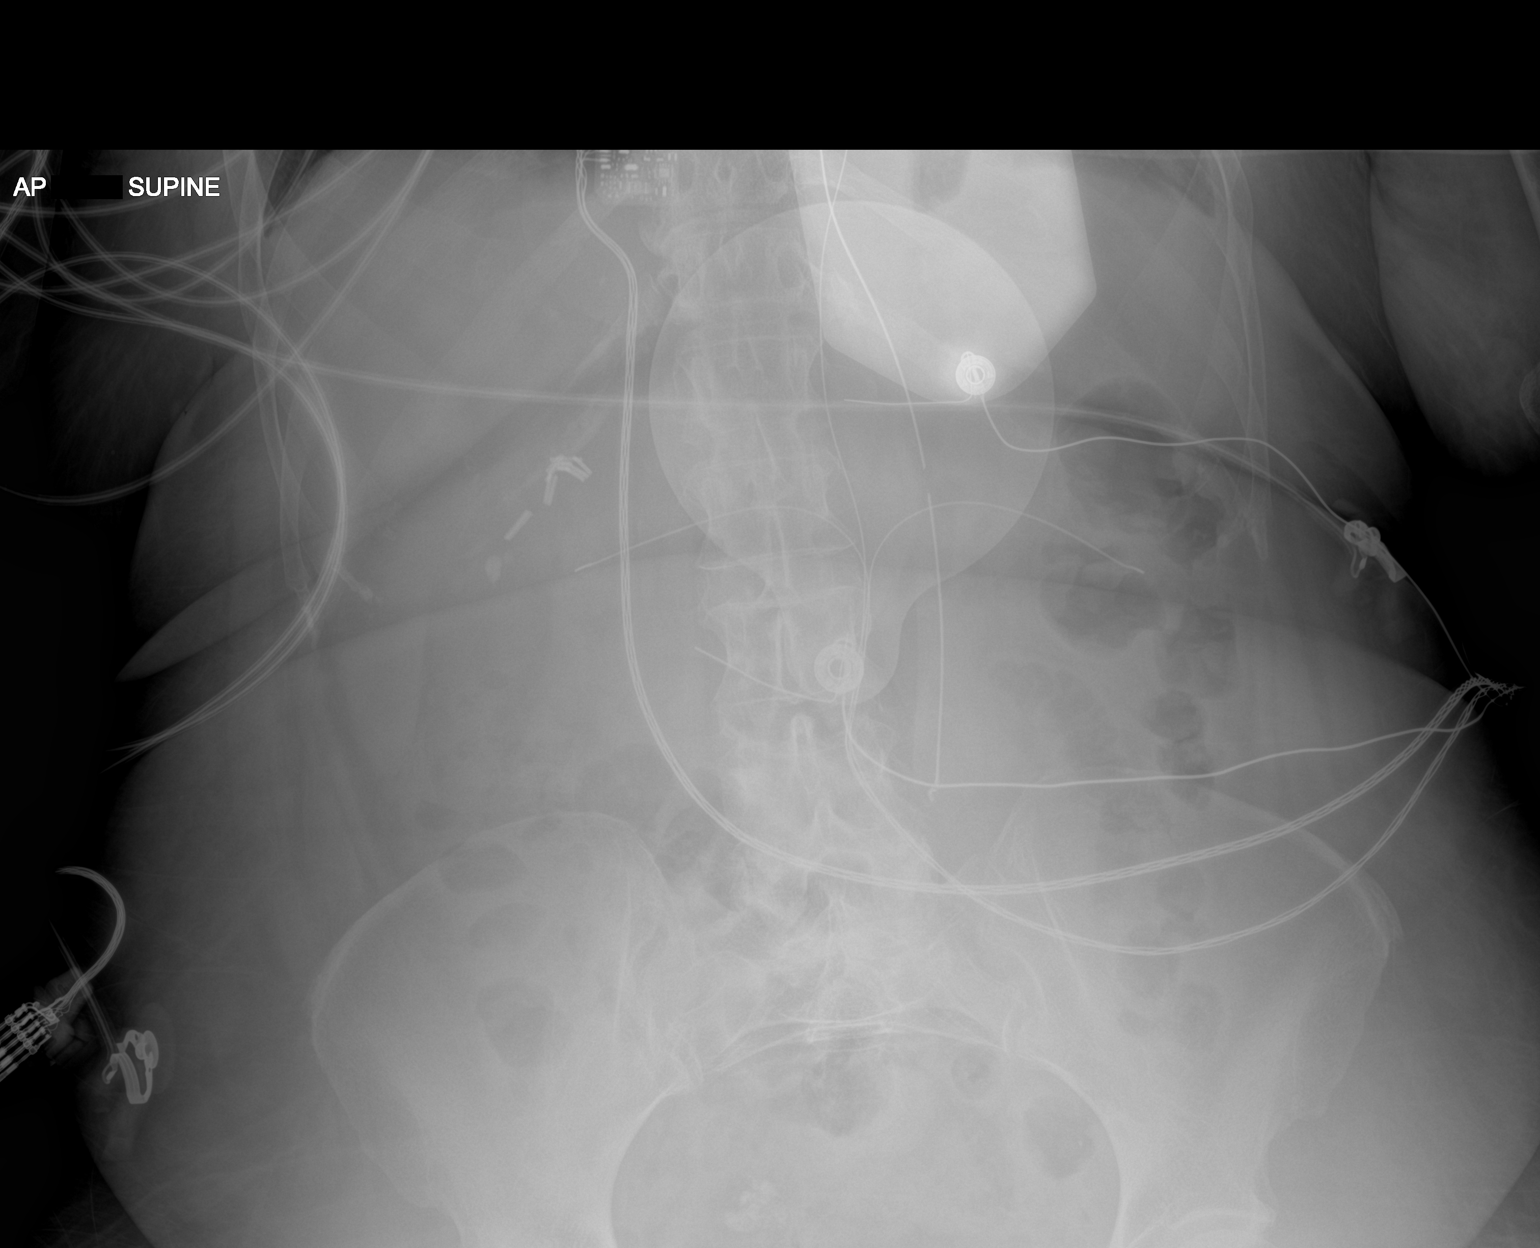

[1 of 1 positions shown; findings below may reference images not displayed]

FINDINGS: Esophageal tube tip and side port overlie the proximal to mid
gastric region. Gas pattern is nonobstructed. Clips in the right
upper quadrant
IMPRESSION: Esophageal tube tip overlies the proximal to mid gastric region.

## 2020-02-26 IMAGING — DX DG CHEST 1V PORT
1 series · 1 of 1 positions shown · non-contrast
Comparison: Same day.

CLINICAL DATA: Ventilator dependent.

EXAM:
PORTABLE CHEST 1 VIEW

[chest ap]
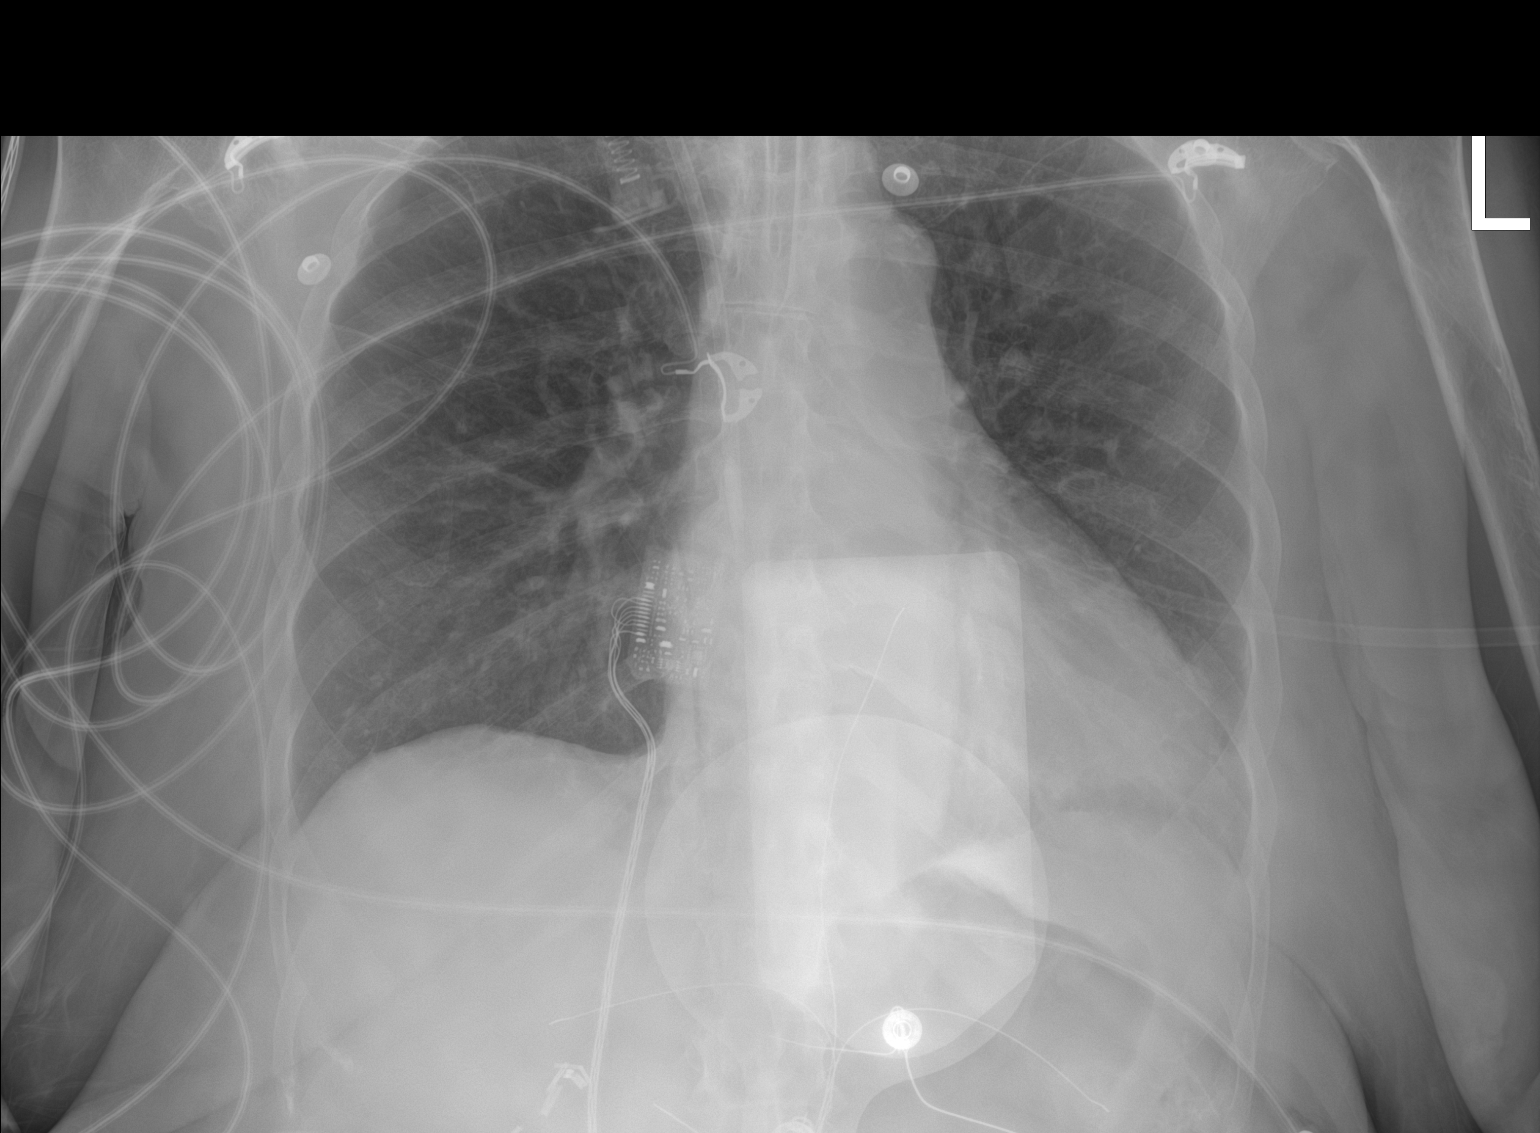

[1 of 1 positions shown; findings below may reference images not displayed]

FINDINGS: Stable cardiomediastinal silhouette. Endotracheal tube is in grossly
good position. No pneumothorax or pleural effusion is noted. Both
lungs are clear. The visualized skeletal structures are
unremarkable.
IMPRESSION: No active disease.

## 2020-02-26 IMAGING — US IR FLUORO GUIDE CV LINE*R*
1 series · 2 of 2 positions shown · non-contrast
Comparison: none

INDICATION: Acute kidney injury

[Series 1: ir fluoro guide cv line*right* · 2 of 2 slices shown]
[im 1/2]
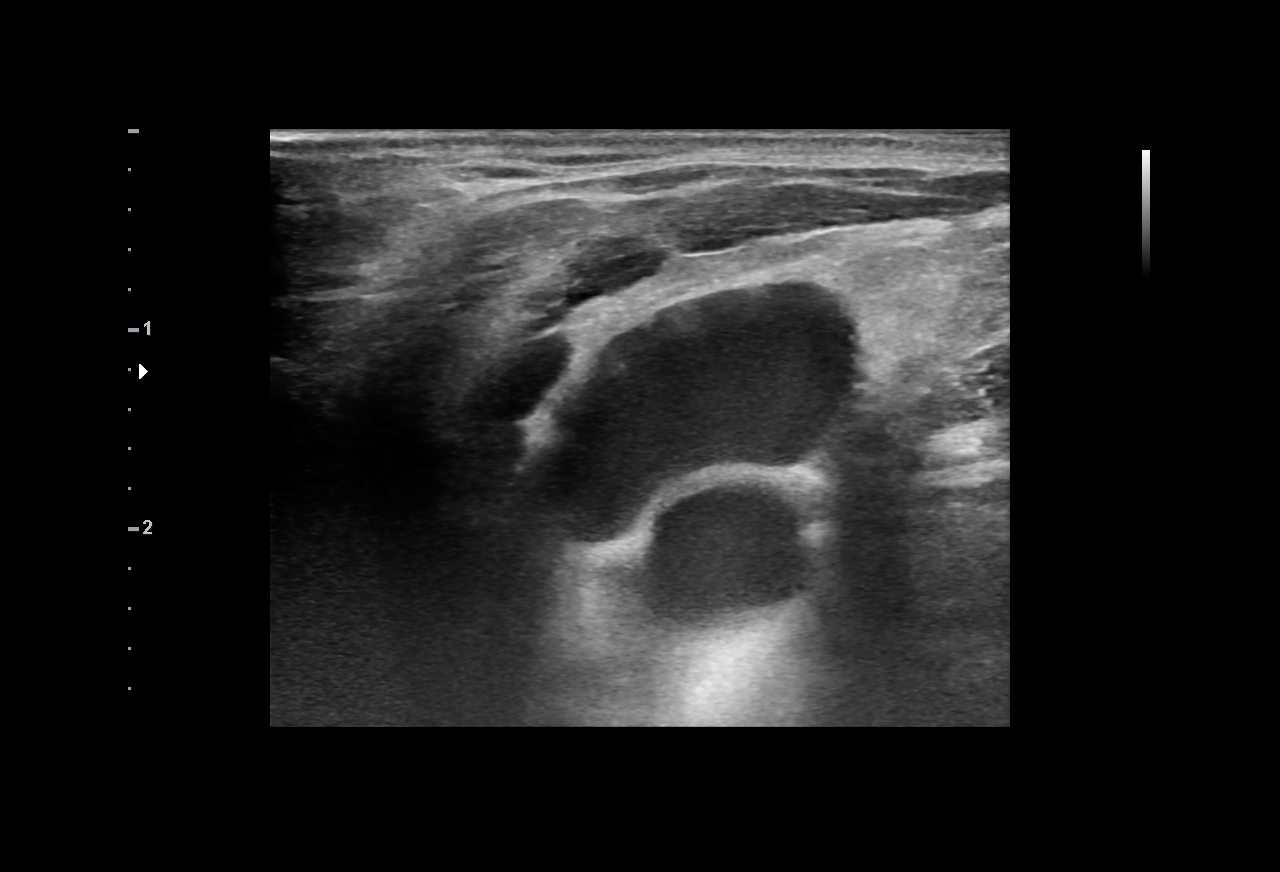
[im 2/2]
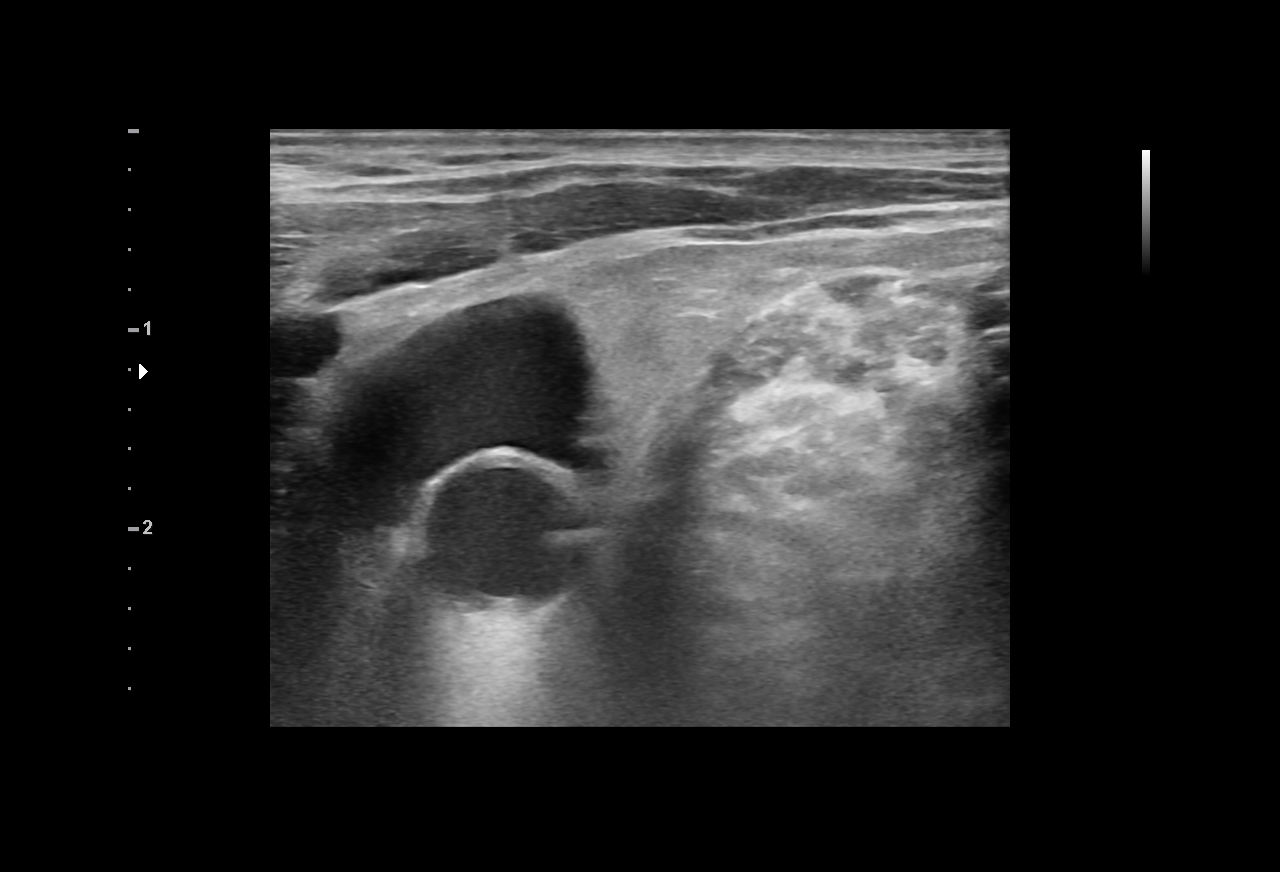

[2 of 2 positions shown; findings below may reference images not displayed]

EXAM:
Non tunneled temporary hemodialysis catheter placement

MEDICATIONS:
None

ANESTHESIA/SEDATION:
None

FLUOROSCOPY TIME:  Fluoroscopy Time: 0 minutes 24 seconds (1 mGy).

COMPLICATIONS:
None immediate.

PROCEDURE:
Informed written consent was obtained from the patient after a
thorough discussion of the procedural risks, benefits and
alternatives. All questions were addressed. Maximal Sterile Barrier
Technique was utilized including caps, mask, sterile gowns, sterile
gloves, sterile drape, hand hygiene and skin antiseptic. A timeout
was performed prior to the initiation of the procedure.

Right neck prepped and draped in the usual sterile fashion. All
elements of maximal sterile barrier were utilized including, cap,
mask, sterile gown, sterile gloves, large sterile drape, hand
scrubbing and 2% Chlorhexidine for skin cleaning.

The right internal jugular vein was evaluated with ultrasound and
shown to be patent. A permanent ultrasound image was obtained and
placed in the patient's medical record. Using sterile gel and a
sterile probe cover, the right internal jugular vein was entered
with a 21 ga needle during real time ultrasound guidance.

0.018 inch guidewire placed and 21 ga needle exchanged for
transitional dilator set. Utilizing fluoroscopy, 0.035 inch
guidewire advanced through the needle without difficulty.

Serial dilation performed, and catheter inserted over the guidewire.
The tip was positioned in the right atrium.

All lumens of the catheter aspirated and flushed well. The dialysis
lumens were locked with Heparin.

The catheter was secured to the skin with suture. The insertion site
was covered with a Biopatch and sterile dressing.
IMPRESSION: Right IJ temporary non tunneled hemodialysis catheter ready for use.

## 2020-02-26 IMAGING — DX DG CHEST 1V PORT
1 series · 1 of 1 positions shown · non-contrast
Comparison: Chest x-ray [DATE].

CLINICAL DATA: 64-year-old female with history of hemodialysis
catheter placement. Renal failure.

EXAM:
PORTABLE CHEST 1 VIEW

[chest ap]
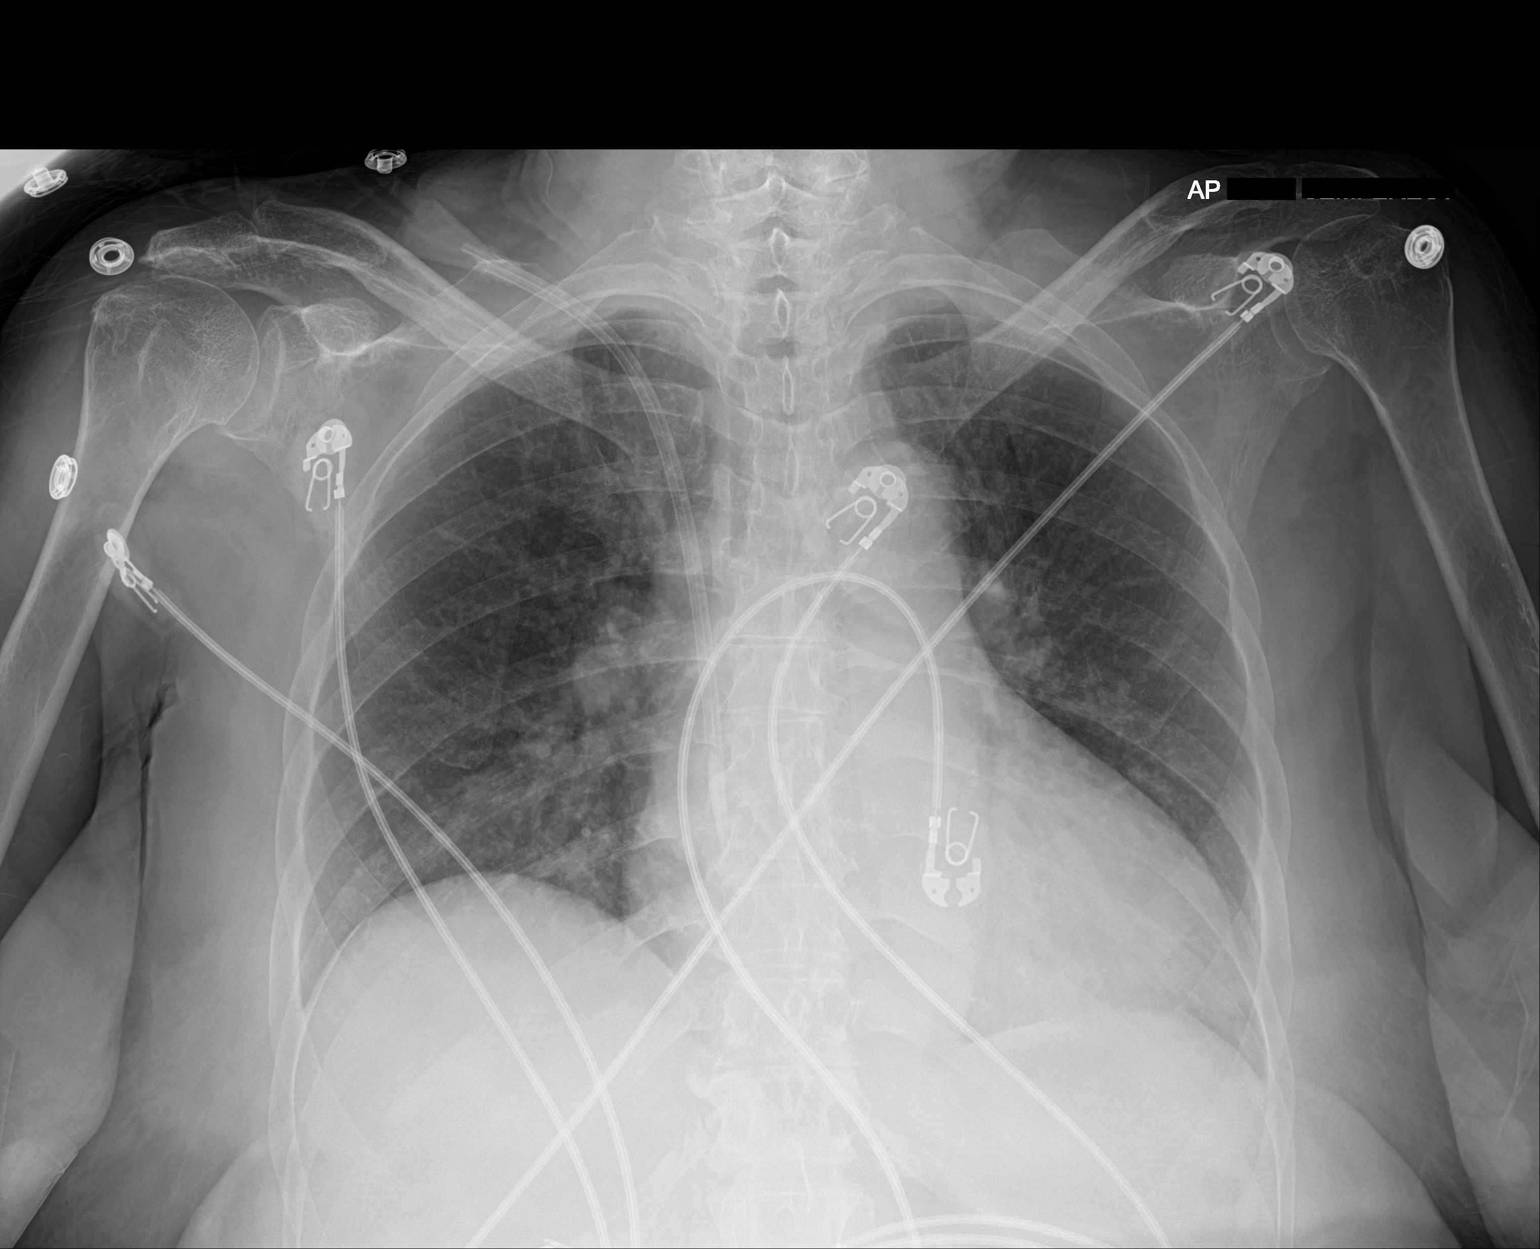

[1 of 1 positions shown; findings below may reference images not displayed]

FINDINGS: New right IJ Vas-Cath with tip terminating in the right atrium. Lung
volumes are low. No consolidative airspace disease. No pleural
effusions. No pneumothorax. No pulmonary nodule or mass noted.
Pulmonary vasculature is normal. Heart size is mildly enlarged.
Upper mediastinal contours are within normal limits.
IMPRESSION: 1. New right IJ Vas-Cath with tip terminating in the right atrium.
No pneumothorax or other acute complicating features.
2. Mild cardiomegaly.

## 2020-02-26 MED ORDER — LORAZEPAM 2 MG/ML IJ SOLN
1.0000 mg | INTRAMUSCULAR | Status: DC | PRN
  Administered 2020-02-27 – 2020-03-04 (×4): 2 mg via INTRAVENOUS
  Filled 2020-02-26 (×5): qty 1

## 2020-02-26 MED ORDER — SODIUM BICARBONATE 8.4 % IV SOLN
50.0000 meq | Freq: Once | INTRAVENOUS | Status: AC
  Administered 2020-02-26: 50 meq via INTRAVENOUS
  Filled 2020-02-26: qty 50

## 2020-02-26 MED ORDER — HEPARIN SODIUM (PORCINE) 1000 UNIT/ML IJ SOLN
INTRAMUSCULAR | Status: AC
  Filled 2020-02-26: qty 1

## 2020-02-26 MED ORDER — PROPOFOL 10 MG/ML IV BOLUS
INTRAVENOUS | Status: DC | PRN
  Administered 2020-02-26: 60 mg via INTRAVENOUS

## 2020-02-26 MED ORDER — SODIUM BICARBONATE 8.4 % IV SOLN
50.0000 meq | Freq: Once | INTRAVENOUS | Status: AC
  Administered 2020-02-26: 50 meq via INTRAVENOUS

## 2020-02-26 MED ORDER — FENTANYL BOLUS VIA INFUSION
50.0000 ug | INTRAVENOUS | Status: DC | PRN
Start: 2020-02-26 — End: 2020-02-27
  Administered 2020-02-26: 50 ug via INTRAVENOUS
  Filled 2020-02-26: qty 50

## 2020-02-26 MED ORDER — SODIUM CHLORIDE 0.9% IV SOLUTION: Freq: Once | INTRAVENOUS | Status: AC

## 2020-02-26 MED ORDER — HYDROCODONE-ACETAMINOPHEN 5-325 MG PO TABS
1.0000 | ORAL_TABLET | ORAL | Status: DC | PRN
Start: 2020-02-26 — End: 2020-02-27
  Administered 2020-02-26: 1 via ORAL
  Filled 2020-02-26: qty 1

## 2020-02-26 MED ORDER — CHLORHEXIDINE GLUCONATE 0.12% ORAL RINSE (MEDLINE KIT)
15.0000 mL | Freq: Two times a day (BID) | OROMUCOSAL | Status: DC
  Administered 2020-02-26 – 2020-03-11 (×27): 15 mL via OROMUCOSAL

## 2020-02-26 MED ORDER — DOCUSATE SODIUM 50 MG/5ML PO LIQD
100.0000 mg | Freq: Two times a day (BID) | ORAL | Status: DC
  Administered 2020-02-27 (×3): 100 mg
  Filled 2020-02-26 (×3): qty 10

## 2020-02-26 MED ORDER — SODIUM BICARBONATE 4.2 % IV SOLN: 50.0000 meq | Freq: Once | INTRAVENOUS | Status: DC

## 2020-02-26 MED ORDER — PANTOPRAZOLE SODIUM 40 MG IV SOLR
40.0000 mg | INTRAVENOUS | Status: DC
  Administered 2020-02-26: 40 mg via INTRAVENOUS
  Filled 2020-02-26: qty 40

## 2020-02-26 MED ORDER — LIDOCAINE HCL 1 % IJ SOLN
INTRAMUSCULAR | Status: AC
  Filled 2020-02-26: qty 20

## 2020-02-26 MED ORDER — PRISMASOL BGK 4/2.5 32-4-2.5 MEQ/L EC SOLN
Status: DC
  Filled 2020-02-26 (×22): qty 5000

## 2020-02-26 MED ORDER — FENTANYL CITRATE (PF) 100 MCG/2ML IJ SOLN
50.0000 ug | INTRAMUSCULAR | Status: DC | PRN
  Administered 2020-02-26: 50 ug via INTRAVENOUS

## 2020-02-26 MED ORDER — POLYETHYLENE GLYCOL 3350 17 G PO PACK
17.0000 g | PACK | Freq: Every day | ORAL | Status: DC
  Administered 2020-02-27: 17 g
  Filled 2020-02-26: qty 1

## 2020-02-26 MED ORDER — SODIUM CHLORIDE 0.9% FLUSH
10.0000 mL | Freq: Two times a day (BID) | INTRAVENOUS | Status: DC
  Administered 2020-02-26 – 2020-02-28 (×4): 10 mL
  Administered 2020-02-28: 20 mL
  Administered 2020-02-29 – 2020-03-11 (×20): 10 mL

## 2020-02-26 MED ORDER — LIDOCAINE HCL (PF) 1 % IJ SOLN
INTRAMUSCULAR | Status: AC | PRN
  Administered 2020-02-26: 5 mL

## 2020-02-26 MED ORDER — PRISMASOL BGK 4/2.5 32-4-2.5 MEQ/L REPLACEMENT SOLN
Status: DC
  Filled 2020-02-26 (×6): qty 5000

## 2020-02-26 MED ORDER — SODIUM CHLORIDE 0.9% FLUSH: 10.0000 mL | INTRAVENOUS | Status: DC | PRN

## 2020-02-26 MED ORDER — FENTANYL 2500MCG IN NS 250ML (10MCG/ML) PREMIX INFUSION
50.0000 ug/h | INTRAVENOUS | Status: DC
  Administered 2020-02-26: 50 ug/h via INTRAVENOUS
  Administered 2020-02-28: 200 ug/h via INTRAVENOUS
  Filled 2020-02-26 (×3): qty 250

## 2020-02-26 MED ORDER — FUROSEMIDE 10 MG/ML IJ SOLN
80.0000 mg | Freq: Once | INTRAMUSCULAR | Status: AC
  Administered 2020-02-26: 80 mg via INTRAVENOUS
  Filled 2020-02-26: qty 8

## 2020-02-26 MED ORDER — INSULIN ASPART 100 UNIT/ML ~~LOC~~ SOLN
2.0000 [IU] | SUBCUTANEOUS | Status: DC
  Administered 2020-02-26: 2 [IU] via SUBCUTANEOUS

## 2020-02-26 MED ORDER — DEXMEDETOMIDINE HCL IN NACL 400 MCG/100ML IV SOLN
0.4000 ug/kg/h | INTRAVENOUS | Status: DC
  Administered 2020-02-26 – 2020-02-28 (×3): 0.4 ug/kg/h via INTRAVENOUS
  Filled 2020-02-26 (×3): qty 100

## 2020-02-26 MED ORDER — PRISMASOL BGK 4/2.5 32-4-2.5 MEQ/L REPLACEMENT SOLN
Status: DC
  Filled 2020-02-26 (×4): qty 5000

## 2020-02-26 MED ORDER — FENTANYL CITRATE (PF) 100 MCG/2ML IJ SOLN
50.0000 ug | INTRAMUSCULAR | Status: DC | PRN
  Administered 2020-02-26: 100 ug via INTRAVENOUS
  Filled 2020-02-26: qty 4

## 2020-02-26 MED ORDER — ORAL CARE MOUTH RINSE
15.0000 mL | OROMUCOSAL | Status: DC
  Administered 2020-02-26 – 2020-02-28 (×16): 15 mL via OROMUCOSAL

## 2020-02-26 MED ORDER — CALCIUM GLUCONATE-NACL 2-0.675 GM/100ML-% IV SOLN
2.0000 g | Freq: Once | INTRAVENOUS | Status: AC
  Administered 2020-02-26: 2000 mg via INTRAVENOUS
  Filled 2020-02-26: qty 100

## 2020-02-26 MED ORDER — HEPARIN SODIUM (PORCINE) 1000 UNIT/ML DIALYSIS
1000.0000 [IU] | INTRAMUSCULAR | Status: DC | PRN
  Administered 2020-02-27: 1000 [IU] via INTRAVENOUS_CENTRAL
  Administered 2020-02-28: 2600 [IU] via INTRAVENOUS_CENTRAL
  Filled 2020-02-26: qty 3
  Filled 2020-02-26: qty 4
  Filled 2020-02-26 (×3): qty 6

## 2020-02-26 MED ORDER — SUCCINYLCHOLINE CHLORIDE 20 MG/ML IJ SOLN
INTRAMUSCULAR | Status: DC | PRN
  Administered 2020-02-26: 100 mg via INTRAVENOUS

## 2020-02-26 NOTE — Anesthesia Procedure Notes (Signed)
Procedure Name: Intubation Date/Time: 02/26/2020 2:57 PM Performed by: Darletta Moll, CRNA Pre-anesthesia Checklist: Patient identified, Emergency Drugs available, Suction available and Patient being monitored Patient Re-evaluated:Patient Re-evaluated prior to induction Oxygen Delivery Method: Circle system utilized Preoxygenation: Pre-oxygenation with 100% oxygen Induction Type: IV induction and Rapid sequence Ventilation: Mask ventilation without difficulty Laryngoscope Size: Glidescope and 3 Grade View: Grade I Tube type: Oral Tube size: 7.5 mm Number of attempts: 1 Airway Equipment and Method: Video-laryngoscopy and Rigid stylet Placement Confirmation: ETT inserted through vocal cords under direct vision,  positive ETCO2,  breath sounds checked- equal and bilateral and CO2 detector Secured at: 23 (at lip) cm Tube secured with: Tape Dental Injury: Teeth and Oropharynx as per pre-operative assessment

## 2020-02-26 NOTE — Progress Notes (Signed)
[facility] Progress Note  Name: Barbara Crawford MRN: [id number] DOB: [birth year]  Chief Complaint:  Back pain and lower extremity swelling  Subjective:  she was off floor earlier with IR when I attempted to see her.  Now back with nontunneled catheter.  Per patient and nursing she has not gotten any blood yet and they are going to call blood bank.  Spoke with pt and her son.  Couldn't walk earlier due to weak and leg swelling but leg swelling better.  She has consented to blood.  Denies NSAID use.   Review of systems:  Denies shortness of breath or chest pain  Denies n/v  --------- Background on consult:   HPI: Barbara Crawford is an 65 y.o. female with HTN who is seen for evaluation and management of AKI.  She had COVID + in 01/2020 (unvaccinated), had 1 syncopal episode during [place], recovered at home and had some fatigue but was better enough to return to work and generally back to baseline.  Started having low back pain and decreased voiding for about month, worse for a week.  Urine character is normal per her but daughter thinks urine is cloudy and malodorous.   Using topical aspercreme and bengay for back with some help.  Poor appetite, more sleepy lately.  No emesis, no hiccups. +pruritis but thinks dry skin.  No confusion. Hydrocodone/APAP given urgent care yesterday; also takes nifedipine, lisinopril.  APAP only, no NSAIDs.  No h/o kidney stones.  Wt up and down lately but usually 150lbs.  LE edema x 1 week.  Back pain worse today and LE swelling worse so came into [facility] ED where labs showing Hb 6s, WBC 8.6, Plt 282, Na 130, K 5.4, bicarb 9, BUN 144, Cr 30, Ca 6.8, Alb 2.1, LFTs ok, Phos > 30.  CT renal stone prot showing L 38m nonobstructing nephrolith, R kidney ok.  PCP referred her to nephrology - has outpt apt in 03/2020. Says abnormal kidney function and anemia but those labs aren't available.  She's not sure if urine was sent.  No fevers, chills.  No eye  issues, no oral ulcers.  No bleeding.  Constipation lately.  Has a tooth infection x 1 year - couldn't afford extraction so it's been ongoing.  No family h/o ESRD or CKD.    No intake or output data in the 24 hours ending 02/26/20 1143  Vitals:  Vitals:   02/26/20 1015 02/26/20 1030 02/26/20 1045 02/26/20 1100  BP: (!) 148/85 (!) 151/83 (!) 145/80 (!) 151/82  Pulse: (!) 107 (!) 113 (!) 109 (!) 108  Resp: 20 (!) '24 20 20  '$ Temp:      TempSrc:      SpO2: 99% 99% 99% 97%  Weight:      Height:         Physical Exam:  General adult female in bed in no acute distress HEENT normocephalic atraumatic extraocular movements intact sclera anicteric Neck supple trachea midline Lungs clear to auscultation bilaterally normal work of breathing at rest  Heart regular rate and rhythm no rubs or gallops appreciated Abdomen soft nontender nondistended Extremities trace lower extremity edema  Psych normal mood and affect Neuro - alert and oriented x 3 provides hx and follows commands  Access RIJ nontunneled catheter   Medications reviewed   Labs:  BMP Latest Ref Rng & Units 02/26/2020 02/25/2020 01/28/2016  Glucose 70 - 99 mg/dL 119(H) 97 92  BUN 8 - 23 mg/dL 148(H)  144(H) 14  Creatinine 0.44 - 1.00 mg/dL 29.68(H) 29.98(H) 0.93  Sodium 135 - 145 mmol/L 132(L) 130(L) 138  Potassium 3.5 - 5.1 mmol/L 5.1 5.4(H) 3.2(L)  Chloride 98 - 111 mmol/L 99 99 103  CO2 22 - 32 mmol/L 11(L) 9(L) 25  Calcium 8.9 - 10.3 mg/dL 6.3(LL) 6.8(L) 8.9     Assessment/Plan:   **AKI:   - 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  Appears was brewing in 12/2019 with PCP referring her to nephrology then.  Hep C neg, hep B s antigen neg.  nontunneled catheter on 02/25/20.  UA with > 300 mg/dl protein and 0-5 RBC - HD today and tomorrow   - Follow-up serologies and urine studies - ANCA, ANA, RPR pending, UPEP ordered, SPEP and free light chains pending.  Anti-GBM pending. ASO pending. Complement pending  - Requesting  outpatient records   - Anticipate will likely need a renal biopsy    - Hold ACE inhibitor  - check bladder scan and place foley if post-void residual 250 or greater - Discussed potential bx on 2/25 with patient - she's overwhelmed but wants to do what is needed to get better  ** Proteinuria - appears nephrotic range - up/cr ratio 24980 mg/g  **HTN:  Hold ACEi in setting of AKI.   **Anemia  Severe - :  transfuse 2 units of PRBC's - I asked RN to please give and not wait on HD.  Needs to have at least one of these units completed before HD given severe anemia  Iron deficient and for PRBC's. Check myeloma labs as above.  Monitor for GI losses  **AGMA:  Presume secondary to AKI - for HD. Bicarb is off on my exam - pause for now. Dilution with fluids?     **Hyperkalemia:  s/p lokelma and for HD  **Hyperphosphatemia:  Phos reported > 30; note uric acid is elevated as well at 9.  TLS still possible.  Started renvela and anticipate need to titrate  **Hypocalcemia:  PTH ordered  **Back pain:  Seems out of proportion to small stone; AKI generally not painful.  Seems MSK in origin.  W/u and treatment per primary.    Claudia Desanctis, MD 02/26/2020 12:19 PM

## 2020-02-26 NOTE — Code Documentation (Addendum)
  Patient Name: Barbara Crawford   MRN: [id number]   Date of Birth/ Sex: [birth year] , female      Admission Date: 02/25/2020  Attending Provider: Donne Hazel, MD  Primary Diagnosis: Acute kidney failure [facility])   Indication: Pt was in her usual state of health until this PM, when she was noted to have a witness seizure upon arriving to floor. Code blue was subsequently called. At the time of arrival on scene, ACLS protocol was underway.   Technical Description:  - CPR performance duration:  4 minutes  - Was defibrillation or cardioversion used? No   - Was external pacer placed? Yes  - Was patient intubated pre/post CPR? Yes   Medications Administered: Y = Yes; Blank = No Amiodarone    Atropine    Calcium    Epinephrine    Lidocaine    Magnesium    Norepinephrine    Phenylephrine    Sodium bicarbonate    Vasopressin     Post CPR evaluation:  - Final Status - Was patient successfully resuscitated ? Yes - What is current rhythm? Sinus tachycardia - What is current hemodynamic status? Euvolemic  Miscellaneous Information:  - Labs sent, including: ABG, CBC, CMP  - Primary team notified?  Yes  - Family Notified? Yes  - Additional notes/ transfer status: Transferred to 2M03     Ezequiel Essex, MD  02/26/2020, 3:16 PM

## 2020-02-26 NOTE — H&P (Signed)
Referring Physician(s): Danie Chandler  Supervising Physician: Mir, Sharen Heck  Patient Status:  [facility] - In-pt  Chief Complaint: Acute kidney injury  History: Barbara Crawford is a 65 y.o. female who presented to the emergency department yesterday complaining of  flank pain and lower extremity edema.   She states that about a week ago she developed swelling in her lower extremities.  She has had severe malaise and fatigue and states that she has been sleeping upright in a chair not because she cannot breathe when lying flat and because she has been too weak to get up out of her bed.   She confirms she had Covid back in December.   Her primary care doctor who told her at that time that she had a slight decrease in her kidney function.  Labs done in the emergency department revealed her creatinine is 29.68 and her BUN is 148.  We are asked to place a temporary hemodialysis catheter today.  She likes to use a neck pillow and is concerned about putting the catheter in the jugular vein.  Allergies: Erythromycin, Iodine, Penicillins, and Shellfish-derived products  Medications: Prior to Admission medications   Medication Sig Start Date End Date Taking? Authorizing Provider  acetaminophen (TYLENOL) 650 MG CR tablet Take 650 mg by mouth as needed for pain.   Yes [provider]  aspirin EC 81 MG tablet Take 81 mg by mouth daily.   Yes [provider]  HYDROcodone-acetaminophen (NORCO/VICODIN) 5-325 MG tablet Take 1 tablet by mouth 3 (three) times daily as needed for severe pain. 02/23/20  Yes [provider]  lisinopril (ZESTRIL) 5 MG tablet Take 5 mg by mouth daily. 02/24/20  Yes [provider]  NIFEdipine (ADALAT CC) 30 MG 24 hr tablet Take 30 mg by mouth at bedtime.   Yes [provider]  NIFEdipine (ADALAT CC) 90 MG 24 hr tablet Take 90 mg by mouth every morning. 01/11/16  Yes [provider]     Vital Signs: BP (!)  143/86   Pulse (!) 103   Temp 98.3 F (36.8 C) (Rectal)   Resp (!) 21   Ht '5\' 3"'$  (1.6 m)   Wt 68 kg   SpO2 100%   BMI 26.57 kg/m   Physical Exam Vitals reviewed.  Constitutional:      Appearance: Normal appearance.  HENT:     Head: Normocephalic and atraumatic.  Cardiovascular:     Rate and Rhythm: Tachycardia present.  Pulmonary:     Effort: Pulmonary effort is normal. No respiratory distress.  Abdominal:     Palpations: Abdomen is soft.  Neurological:     General: No focal deficit present.     Mental Status: She is alert and oriented to person, place, and time.  Psychiatric:        Mood and Affect: Mood normal.        Behavior: Behavior normal.        Thought Content: Thought content normal.        Judgment: Judgment normal.     Imaging: US RENAL  Result Date: 02/25/2020 CLINICAL DATA:  Acute kidney injury EXAM: RENAL / URINARY TRACT ULTRASOUND COMPLETE COMPARISON:  CT 02/25/2020 FINDINGS: Right Kidney: Renal measurements: 9.7 x 4.2 x 4.7 cm = volume: 99.6 mL. Diffusely increased renal cortical echogenicity. 9 mm shadowing calculus seen in the interpolar right kidney. Corresponds well to the finding on CT. No hydronephrosis or concerning renal mass. Left Kidney: Renal measurements: 11.1 x  5.9 x 4.0 cm = volume: 136 mL. Diffusely increased renal cortical echogenicity. No concerning renal mass, shadowing calculus or hydronephrosis. Bladder: Appears normal for degree of bladder distention. Other: None. IMPRESSION: 1. Bilaterally increased renal cortical echogenicity compatible with medical renal disease. 2. 9 mm nonobstructive calculus in the interpolar right kidney. Electronically Signed   By: Lovena Le M.D.   On: 02/25/2020 23:18   CT Renal Stone Study  Result Date: 02/25/2020 CLINICAL DATA:  Flank pain for the past week. EXAM: CT ABDOMEN AND PELVIS WITHOUT CONTRAST TECHNIQUE: Multidetector CT imaging of the abdomen and pelvis was performed following the standard protocol  without IV contrast. COMPARISON:  None. FINDINGS: Lower chest: No acute abnormality. Hepatobiliary: No focal liver abnormality is seen. Status post cholecystectomy. No biliary dilatation. Pancreas: Unremarkable. No pancreatic ductal dilatation or surrounding inflammatory changes. Spleen: Normal in size without focal abnormality. Adrenals/Urinary Tract: Adrenal glands and left kidney are unremarkable. 6 mm nonobstructive right renal calculus. No hydronephrosis. The bladder is unremarkable for the degree of distention. Stomach/Bowel: Stomach is within normal limits. Diminutive or absent appendix. No evidence of bowel wall thickening, distention, or inflammatory changes. Vascular/Lymphatic: No significant vascular findings are present. No enlarged abdominal or pelvic lymph nodes. Reproductive: Small calcified uterine fibroids.  No adnexal mass. Other: No abdominal wall hernia or abnormality. No abdominopelvic ascites. No pneumoperitoneum. Musculoskeletal: No acute or significant osseous findings. IMPRESSION: 1. No acute intra-abdominal process. 2. Nonobstructive right nephrolithiasis. Electronically Signed   By: Titus Dubin M.D.   On: 02/25/2020 19:39    Labs:  CBC: Recent Labs    02/25/20 1753 02/26/20 0305  WBC 8.6 7.5  HGB 6.3* 5.3*  HCT 19.9* 15.5*  PLT 282 221    COAGS: No results for input(s): INR, APTT in the last 8760 hours.  BMP: Recent Labs    02/25/20 1753 02/26/20 0305  NA 130* 132*  K 5.4* 5.1  CL 99 99  CO2 9* 11*  GLUCOSE 97 119*  BUN 144* 148*  CALCIUM 6.8* 6.3*  CREATININE 29.98* 29.68*  GFRNONAA 1* 1*    LIVER FUNCTION TESTS: Recent Labs    02/25/20 1753 02/26/20 0305  BILITOT 0.5  --   AST 26  --   ALT 35  --   ALKPHOS 50  --   PROT 7.8  --   ALBUMIN 2.1* 1.6*    Assessment and Plan:  Acute kidney failure of unknown etiology.  We will proceed with image guided placement of temporary hemodialysis catheter today by Dr. Dwaine Gale.  Risks and benefits  discussed with the patient including, but not limited to bleeding, infection, vascular injury, pneumothorax which may require chest tube placement, air embolism or even death  All of the patient's questions were answered, patient is agreeable to proceed. Consent signed and in chart.  Electronically Signed: Murrell Redden, PA-C 02/26/2020, 8:53 AM    I spent a total of 15 Minutes at the the patient's bedside AND on the patient's hospital floor or unit, greater than 50% of which was counseling/coordinating care for image guided placement of temporary hemodialysis catheter.

## 2020-02-26 NOTE — Consult Note (Signed)
NAME:  Barbara Crawford, MRN:  [id number], DOB:  09/20/1955, LOS: 1 ADMISSION DATE:  02/25/2020, CONSULTATION DATE: 02/26/20 REFERRING MD:  TRH, CHIEF COMPLAINT:  Seizure/Cardiac arrest  Brief History   Barbara Crawford is a 65 y.o. female with a pertinent PMH of HTN and recent COVID infection who presented to [facility] with a 1 week history of flank pain with lower extremity edema and admitted for acute renal failure complicated by seizure and cardiac arrest requiring intubation for airway protection.   History of Present Illness   Barbara Crawford is a 65 y.o. female with a pertinent PMH of HTN and recent COVID infection (December) who presented to [facility] with a 1 week history of flank pain with lower extremity edema and admitted for acute renal failure complicated by seizure and cardiac arrest requiring intubation for airway protection. The patient was unable to provide HPI due to intubation. Therefore, history was obtained via chart review.  In the ED patient was found to have a Hgb of 6.3 which trended downward to 5.3 and acute elevation in Cr to 29.68/BUN of 148 with a a bicarb of 11/AGAP of 22. Patient was admitted to the floor where she had seizure like activity and cardiac arrest requiring 2 minutes of CPR. RSOC was achieved and patient was intubated for airway protection. She was then transferred to the ICU.   Past Medical History  HTN   Significant Hospital Events   02/22: admitted 02/23: seizure/cardiac arrest 02/23: Intubatedand transferred to ICU  Consults:  N/A  Procedures:  02/23: intubated  Significant Diagnostic Tests:  US RENAL 02/25/2020 1. Bilaterally increased renal cortical echogenicity compatible with medical renal disease. 2. 9 mm nonobstructive calculus in the interpolar right kidney.   IR Fluoro Guide CV Line Right 02/26/2020  Right IJ temporary non tunneled hemodialysis catheter ready for use.   IR US Guide Vasc Access Right2/23/2022 I Right IJ temporary non tunneled  hemodialysis catheter ready for use.  DG Chest Port 1 View 02/26/2020 1. New right IJ Vas-Cath with tip terminating in the right atrium. No pneumothorax or other acute complicating features. 2. Mild cardiomegaly.  CT Renal Stone Study 02/25/2020  1. No acute intra-abdominal process. 2. Nonobstructive right nephrolithiasis.   Micro Data:  COVID - negative  Antimicrobials:  N/A  Interim history/subjective:   Patient intubated and transferred to ICU for further evaluation and management.   Objective   Blood pressure (!) 161/86, pulse (!) 105, temperature 98.3 F (36.8 C), temperature source Rectal, resp. rate 18, height '5\' 3"'$  (1.6 m), weight 68 kg, SpO2 99 %.       No intake or output data in the 24 hours ending 02/26/20 1523 Filed Weights   02/25/20 1802  Weight: 68 kg    Examination: General: acutely ill appearing female with ETT in place.  HENT: PERRL, mucous membranes are clear and moist Lungs: Clear to ausculation bilaterally,  Cardiovascular: unknown rhythm during cardiac arrest, but currently regular rate and rhythm  Abdomen: soft, non-distended  Extremities: dry, no peripheral edema noted.  Neuro: responding to commands with appropriate strength bilaterally. No focal neuro deficits on exam. CN grossly intact.  GU: unremarkable.  Resolved Hospital Problem list     Assessment & Plan:    Normocytic Anemia: Hypoproliferation: Patient presents with new onset normocytic anemia with iron studies suggesting anemia of chronic kidney disease. Unable to r/o acute iron def anemia. Her reticulocyte index is 0.27 with an elevated ferritin of 620 and Hgb of 6.3>5.3. CT  abdomen and pelvis without evidence of intraabdominal hemorrhage. Will get a CT head to r/o acute brain bleed as she presented to the ICU with a seizure. This could be secondary to acute renal failure.  - Transfusing 2 units of PRBC - She will need iron transfusion with RRT.  - Iron studies show evidence of  anemia of chronic disease - Reticulocyte count shows hypoproliferative anemia. - PT/INR pending - Smear pending.   Acute Renal Failure: Uremia: AGMA: Proteinuria: Hyperphosphatemia: Patient presents with acute renal failure without prior history. Unknown etiology, but currently being worked up for paraproteinemia and   - Nephrology consulted for RRT - Phosphate binders ordered.  - Work up in process for intrarenal pathology including paraproteinemia and nephrotic syndrome. - Strict ins and outs - avoid nephrotoxic medications   Seizure: Patient had a seizure in the setting of acute renal failure and significant acidemia. - Intubated for airway protection - VAP bundle  - PPI for GI protection - PAD protocol with RAS of -1  - Continuous EEG  - PRN ativan for seizures - CT head w/o contrast to r/u intracranial pathology.  - Seizure precautions  - Will get BC/UCx   Best Practice:  Diet: NPO Pain/Anxiety/Delirium protocol (if indicated): fentanyl and precedex VAP protocol (if indicated): indicated  DVT prophylaxis: SCD duye to acute anemia GI prophylaxis: PPI Glucose control: SSI Mobility: bed rest Code Status: Full Family Communication: spoke with daughter and son at bedside Disposition: ICU  Goals of Care:  Last date of multidisciplinary goals of care discussion: 02/23 Family and staff present: son and daughter Summary of discussion: full code Follow up goals of care discussion due: 02/28 Code Status: full  Labs   CBC: Recent Labs  Lab 02/25/20 1753 02/26/20 0305  WBC 8.6 7.5  NEUTROABS 7.0  --   HGB 6.3* 5.3*  HCT 19.9* 15.5*  MCV 86.1 82.9  PLT 282 [id number]    Basic Metabolic Panel: Recent Labs  Lab 02/25/20 1753 02/25/20 1958 02/26/20 0305  NA 130*  --  132*  K 5.4*  --  5.1  CL 99  --  99  CO2 9*  --  11*  GLUCOSE 97  --  119*  BUN 144*  --  148*  CREATININE 29.98*  --  29.68*  CALCIUM 6.8*  --  6.3*  PHOS  --  >30.0* >30.0*   GFR: Estimated  Creatinine Clearance: 1.8 mL/min (A) (by C-G formula based on SCr of 29.68 mg/dL (H)). Recent Labs  Lab 02/25/20 1753 02/26/20 0305  WBC 8.6 7.5    Liver Function Tests: Recent Labs  Lab 02/25/20 1753 02/26/20 0305  AST 26  --   ALT 35  --   ALKPHOS 50  --   BILITOT 0.5  --   PROT 7.8  --   ALBUMIN 2.1* 1.6*   Recent Labs  Lab 02/25/20 1753  LIPASE 71*   No results for input(s): AMMONIA in the last 168 hours.  ABG No results found for: PHART, PCO2ART, PO2ART, HCO3, TCO2, ACIDBASEDEF, O2SAT   Coagulation Profile: No results for input(s): INR, PROTIME in the last 168 hours.  Cardiac Enzymes: No results for input(s): CKTOTAL, CKMB, CKMBINDEX, TROPONINI in the last 168 hours.  HbA1C: No results found for: HGBA1C  CBG: Recent Labs  Lab 02/26/20 1450  GLUCAP 113*    Review of Systems:   ROS  Past Medical History  She,  has a past medical history of Hypertension.   Surgical History  Past Surgical History:  Procedure Laterality Date  . IR FLUORO GUIDE CV LINE RIGHT  02/26/2020  . IR US GUIDE VASC ACCESS RIGHT  02/26/2020     Social History   reports that she has never smoked. She does not have any smokeless tobacco history on file. She reports current alcohol use. She reports that she does not use drugs.   Family History   Her family history is not on file.   Allergies Allergies  Allergen Reactions  . Erythromycin Other (See Comments)    Pt does not recall  . Iodine Other (See Comments)    Arm flares up per pt  . Penicillins     Has patient had a PCN reaction causing immediate rash, facial/tongue/throat swelling, SOB or lightheadedness with hypotension: no Has patient had a PCN reaction causing severe rash involving mucus membranes or skin necrosis: yes Has patient had a PCN reaction that required hospitalization: no Has patient had a PCN reaction occurring within the last 10 years: no If all of the above answers are "NO", then may proceed with  Cephalosporin use.  [employer]      Home Medications  Prior to Admission medications   Medication Sig Start Date End Date Taking? Authorizing Provider  acetaminophen (TYLENOL) 650 MG CR tablet Take 650 mg by mouth as needed for pain.   Yes [provider]  aspirin EC 81 MG tablet Take 81 mg by mouth daily.   Yes [provider]  HYDROcodone-acetaminophen (NORCO/VICODIN) 5-325 MG tablet Take 1 tablet by mouth 3 (three) times daily as needed for severe pain. 02/23/20  Yes [provider]  lisinopril (ZESTRIL) 5 MG tablet Take 5 mg by mouth daily. 02/24/20  Yes [provider]  NIFEdipine (ADALAT CC) 30 MG 24 hr tablet Take 30 mg by mouth at bedtime.   Yes [provider]  NIFEdipine (ADALAT CC) 90 MG 24 hr tablet Take 90 mg by mouth every morning. 01/11/16  Yes [provider]     Critical care time:      Lawerance Cruel, D.O.  Internal Medicine Resident, PGY-2 [facility]  Pager: [phone] 3:23 PM, 02/26/2020

## 2020-02-26 NOTE — ED Notes (Signed)
Pt transported to IR 

## 2020-02-26 NOTE — Progress Notes (Incomplete)
Attending note: I have seen and examined the patient. History, labs and imaging reviewed.  65 year old with hypertension, chronic kidney disease, recent Covid pneumonia admitted with abdominal pain, acute kidney injury with nephrotic syndrome.  Also noted to have severe anemia with no obvious bleed.  CT abdomen pelvis did not show any acute abnormality.  Next She had a temporary HD catheter placed by IR and transferred to the floor when she had her PEA arrest with seizure-like activity.  Intubated and transferred to the ICU   Blood pressure (!) 161/86, pulse (!) 105, temperature 98.3 F (36.8 C), temperature source Rectal, resp. rate 18, height '5\' 3"'$  (1.6 m), weight 68 kg, SpO2 99 %. Gen:      No acute distress HEENT:  EOMI, sclera anicteric Neck:     No masses; no thyromegaly, ETT Lungs:    Clear to auscultation bilaterally; normal respiratory effort CV:         Regular rate and rhythm; no murmurs Abd:      + bowel sounds; soft, non-tender; no palpable masses, no distension Ext:    No edema; adequate peripheral perfusion Skin:      Warm and dry; no rash Neuro: alert and oriented x 3 Psych: normal mood and affect   Labs/Imaging personally reviewed, significant for BUN/creatinine 148/29.68, Phos greater than 30, bicarb 11 Hemoglobin 5.3  CT abdomen, renal study 2/22-no acute intra-abdominal process, nonobstructive right nephrolithiasis. Chest x-ray with no acute abnormality.  Assessment/plan: Cardiac arrest Suspect secondary to combination of acidosis from kidney injury, metabolic abnormalities and anemia She is responding to commands and will not need cooling Get CT head and EEG to evaluate for seizures Holding antibiotics as there is no evidence of sepsis or infection. Check cultures  Acute kidney injury, nephrotic syndrome. Work-up in progress including serologies and urine studies Initiate CVVH Discussed with Dr. Royce Macadamia, nephrology  Severe anemia No clear evidence of  bleed.  Transfuse 2 units PRBC Follow CBC  The patient is critically ill with multiple organ systems failure and requires high complexity decision making for assessment and support, frequent evaluation and titration of therapies, application of advanced monitoring technologies and extensive interpretation of multiple databases.  Critical care time - 35 mins. This represents my time independent of the NPs time taking care of the pt.  Marshell Garfinkel MD [facility] 02/26/2020, 3:33 PM

## 2020-02-26 NOTE — Progress Notes (Signed)
Pt arrived to 5W31 from ED via SWAT RN.  Alert on arrival, Pt wanted to get onto bed herself d/t pain. Pt had just transferred over to bed from stretcher and within a minute began seizing/shaking. Pt layed flat, onto side with staff keeping her safe from hitting her head.  Pt became unresponsive with no pulse. Compressions started, and CODE Blue called. Code team at [facility] came after. Pulse detected, spontaneous HR. No meds given. Pt intubated and transferred to 2M03.

## 2020-02-26 NOTE — ED Notes (Signed)
Alcario Drought, MD made aware of critical labs

## 2020-02-26 NOTE — Progress Notes (Signed)
EEG completed, results pending. 

## 2020-02-26 NOTE — ED Notes (Signed)
Tele Breakfast Order Placed

## 2020-02-26 NOTE — Progress Notes (Signed)
   02/26/20 1455  Clinical Encounter Type  Visited With Health care provider  Visit Type Code  Referral From Other (Comment) (Page)  Consult/Referral To Chaplain  Chaplain  responded to Code Blue for Barbara Crawford.  Medical Team was able to revive and she was on 5W and was moved to 2M03.  Patient daughter [name] was called by the [facility] and given an update on her mother.  She informed she was close by and on her way. Chaplain was not needed at this time.  Chaplain Azul Coffie Morgan-Simpson [phone]

## 2020-02-26 NOTE — Procedures (Signed)
Interventional Radiology Procedure Note  Procedure: Non-tunneled HD catheter   Indication: Renal Failure  Findings: Right IJ Non-tunneled HD catheter ready for use. Please refer to procedural dictation for full description.  Complications: None  EBL: < 10 mL  Miachel Roux, MD [phone]

## 2020-02-26 NOTE — Progress Notes (Signed)
SPoke with Dr. Justin Mend, the on-call Newphrologist regarding IHD vs CRRT. Dr. Justin Mend stated to initiate CRRT tonight and reassess IHD in the morning.

## 2020-02-26 NOTE — Progress Notes (Signed)
Received verbal order from Opal, NP to hold CRRT until 2 units of PRBCs have infused.

## 2020-02-26 NOTE — Progress Notes (Signed)
PROGRESS NOTE    Bryseida Somogyi  [id number] DOB: April 28, 1955 DOA: 02/25/2020 PCP: Vonna Drafts, FNP    Brief Narrative:  65 y.o. female with medical history significant of HTN. Pt had COVID in Dec, at that time her PCP told her she had a slight decrease in her kidney function. Pt noted to have increasing LE edema with L flank pain. Work up demonstrated a kgb of 6.3 with Cr of 29.98 and BUN of 144. Pt was admitted for further w/u  Assessment & Plan:   Principal Problem:   Acute kidney failure ([place]) Active Problems:   Anemia   Uremia   HTN (hypertension)   1. AKF - 1. Nephrology consulted and is following. Plan for initiating of HD here 2. Avoid nephrotoxic agents 3. Of note, pt is voiding currently 2. Anemia, likely chronic - 1. Iron of 38 with TIBC of 158 and ferritin of 620 2. Hgb of 5.3 this AM with repeat of 6.4 3. PRBC's ordered by Nephrology 4. Would follow serial CBC trends and cont to transfuse as needed 3. HTN - 1. Stable bp this AM 2. Pt was cont on nifedipine 3. Lisinopril on hold given worsening renal function 4. Hx COVID 1. Stable currently 2. On minimal O2 support 5. Hyponatremia 1. Suspect secondary to volume overload in setting of renal failure 2. Volume management per above 3. Would follow bmet trends 6. Metabolic acidosis 1. Suspect secondary to worsening renal failure 2. To initiate HD per above 7. Hypoalbuminemia  1. Albumin of 1.6 2. Would recommend nutrition consultation  8. Cardiac arrest 1. This afternoon, pt was found to have seizure-like activity, later with cardiac arrest requiring 2 min of CPR. Pt was intubated and transferred to ICU 2. Suspect cardiac arrest is a result of metabolic acidosis and anemia  DVT prophylaxis: Heparin subq Code Status: Full Family Communication: Pt in room, pt's son at bedside  Status is: Inpatient  Remains inpatient appropriate because:Hemodynamically unstable, Ongoing diagnostic testing  needed not appropriate for outpatient work up, Unsafe d/c plan and Inpatient level of care appropriate due to severity of illness   Dispo: The patient is from: Home              Anticipated d/c is to: Unclear at this time              Anticipated d/c date is: > 3 days              Patient currently is not medically stable to d/c.   Difficult to place patient No       Consultants:   Nephrology  PCCM  Procedures:     Antimicrobials: Anti-infectives (From admission, onward)   None       Subjective: This AM, pt reported feeling weak and cold  Objective: Vitals:   02/26/20 1745 02/26/20 1800 02/26/20 1815 02/26/20 1835  BP: 135/88 (!) 146/95 (!) 153/93   Pulse: 67 67 70   Resp: (!) 28 (!) 28 (!) 28   Temp:      TempSrc:      SpO2: 100% 100% 100%   Weight:    73.6 kg  Height:        Intake/Output Summary (Last 24 hours) at 02/26/2020 1844 Last data filed at 02/26/2020 1827 Gross per 24 hour  Intake 646.9 ml  Output --  Net 646.9 ml   Filed Weights   02/25/20 1802 02/26/20 1537 02/26/20 1835  Weight: 68 kg 70.4 kg 73.6  kg    Examination:  General exam: Appears calm and comfortable  Respiratory system: Clear to auscultation. Respiratory effort normal. Cardiovascular system: S1 & S2 heard, Regular Gastrointestinal system: Abdomen is nondistended, soft and nontender. No organomegaly or masses felt. Normal bowel sounds heard. Central nervous system: Alert and oriented. No focal neurological deficits. Extremities: Symmetric 5 x 5 power. Skin: No rashes, lesions Psychiatry: Judgement and insight appear normal. Mood & affect appropriate.   Data Reviewed: I have personally reviewed following labs and imaging studies  CBC: Recent Labs  Lab 02/25/20 1753 02/26/20 0305 02/26/20 1457 02/26/20 1701  WBC 8.6 7.5 11.0*  --   NEUTROABS 7.0  --  7.4  --   HGB 6.3* 5.3* 6.4* 7.5*  HCT 19.9* 15.5* 20.2* 22.0*  MCV 86.1 82.9 85.6  --   PLT 282 221 317  --     Basic Metabolic Panel: Recent Labs  Lab 02/25/20 1753 02/25/20 1958 02/26/20 0305 02/26/20 1457 02/26/20 1529 02/26/20 1701  NA 130*  --  132* 134* 132* 131*  K 5.4*  --  5.1 4.4 4.5 4.8  CL 99  --  99 96* 97*  --   CO2 9*  --  11* 10* 10*  --   GLUCOSE 97  --  119* 150* 154*  --   BUN 144*  --  148* 143* 141*  --   CREATININE 29.98*  --  29.68* 30.13* 28.86*  --   CALCIUM 6.8*  --  6.3* 6.5* 6.1*  --   PHOS  --  >30.0* >30.0*  --  >30.0*  --    GFR: Estimated Creatinine Clearance: 1.9 mL/min (A) (by C-G formula based on SCr of 28.86 mg/dL (H)). Liver Function Tests: Recent Labs  Lab 02/25/20 1753 02/26/20 0305 02/26/20 1457 02/26/20 1529  AST 26  --  46*  --   ALT 35  --  38  --   ALKPHOS 50  --  49  --   BILITOT 0.5  --  0.3  --   PROT 7.8  --  6.6  --   ALBUMIN 2.1* 1.6* 1.6* 1.6*   Recent Labs  Lab 02/25/20 1753  LIPASE 71*   No results for input(s): AMMONIA in the last 168 hours. Coagulation Profile: No results for input(s): INR, PROTIME in the last 168 hours. Cardiac Enzymes: No results for input(s): CKTOTAL, CKMB, CKMBINDEX, TROPONINI in the last 168 hours. BNP (last 3 results) No results for input(s): PROBNP in the last 8760 hours. HbA1C: No results for input(s): HGBA1C in the last 72 hours. CBG: Recent Labs  Lab 02/26/20 1450 02/26/20 1551 02/26/20 1805  GLUCAP 113* 135* 122*   Lipid Profile: No results for input(s): CHOL, HDL, LDLCALC, TRIG, CHOLHDL, LDLDIRECT in the last 72 hours. Thyroid Function Tests: No results for input(s): TSH, T4TOTAL, FREET4, T3FREE, THYROIDAB in the last 72 hours. Anemia Panel: Recent Labs    02/25/20 1958  VITAMINB12 950*  FOLATE 14.8  FERRITIN 620*  TIBC 158*  IRON 38  RETICCTPCT 1.4   Sepsis Labs: No results for input(s): PROCALCITON, LATICACIDVEN in the last 168 hours.  Recent Results (from the past 240 hour(s))  Resp Panel by RT-PCR (Flu A&B, Covid) Nasopharyngeal Swab     Status: None    Collection Time: 02/25/20  6:12 PM   Specimen: Nasopharyngeal Swab; Nasopharyngeal(NP) swabs in vial transport medium  Result Value Ref Range Status   SARS Coronavirus 2 by RT PCR NEGATIVE NEGATIVE Final  Comment: (NOTE) SARS-CoV-2 target nucleic acids are NOT DETECTED.  The SARS-CoV-2 RNA is generally detectable in upper respiratory specimens during the acute phase of infection. The lowest concentration of SARS-CoV-2 viral copies this assay can detect is 138 copies/mL. A negative result does not preclude SARS-Cov-2 infection and should not be used as the sole basis for treatment or other patient management decisions. A negative result may occur with  improper specimen collection/handling, submission of specimen other than nasopharyngeal swab, presence of viral mutation(s) within the areas targeted by this assay, and inadequate number of viral copies(<138 copies/mL). A negative result must be combined with clinical observations, patient history, and epidemiological information. The expected result is Negative.  Fact Sheet for Patients:  EntrepreneurPulse.com.au  Fact Sheet for Healthcare Providers:  IncredibleEmployment.be  This test is no t yet approved or cleared by the [place] FDA and  has been authorized for detection and/or diagnosis of SARS-CoV-2 by FDA under an Emergency Use Authorization (EUA). This EUA will remain  in effect (meaning this test can be used) for the duration of the COVID-19 declaration under Section 564(b)(1) of the Act, 21 U.S.C.section 360bbb-3(b)(1), unless the authorization is terminated  or revoked sooner.       Influenza A by PCR NEGATIVE NEGATIVE Final   Influenza B by PCR NEGATIVE NEGATIVE Final    Comment: (NOTE) The Xpert Xpress SARS-CoV-2/FLU/RSV plus assay is intended as an aid in the diagnosis of influenza from Nasopharyngeal swab specimens and should not be used as a sole basis for treatment.  Nasal washings and aspirates are unacceptable for Xpert Xpress SARS-CoV-2/FLU/RSV testing.  Fact Sheet for Patients: EntrepreneurPulse.com.au  Fact Sheet for Healthcare Providers: IncredibleEmployment.be  This test is not yet approved or cleared by the [place] FDA and has been authorized for detection and/or diagnosis of SARS-CoV-2 by FDA under an Emergency Use Authorization (EUA). This EUA will remain in effect (meaning this test can be used) for the duration of the COVID-19 declaration under Section 564(b)(1) of the Act, 21 U.S.C. section 360bbb-3(b)(1), unless the authorization is terminated or revoked.  Performed at [facility], [place] [address] [place], [place] [address]   MRSA PCR Screening     Status: None   Collection Time: 02/26/20  4:00 PM   Specimen: Nasopharyngeal  Result Value Ref Range Status   MRSA by PCR NEGATIVE NEGATIVE Final    Comment:        The GeneXpert MRSA Assay (FDA approved for NASAL specimens only), is one component of a comprehensive MRSA colonization surveillance program. It is not intended to diagnose MRSA infection nor to guide or monitor treatment for MRSA infections. Performed at [facility], [place] [address], [place], [place] [address]      Radiology Studies: US RENAL  Result Date: 02/25/2020 CLINICAL DATA:  Acute kidney injury EXAM: RENAL / URINARY TRACT ULTRASOUND COMPLETE COMPARISON:  CT 02/25/2020 FINDINGS: Right Kidney: Renal measurements: 9.7 x 4.2 x 4.7 cm = volume: 99.6 mL. Diffusely increased renal cortical echogenicity. 9 mm shadowing calculus seen in the interpolar right kidney. Corresponds well to the finding on CT. No hydronephrosis or concerning renal mass. Left Kidney: Renal measurements: 11.1 x 5.9 x 4.0 cm = volume: 136 mL. Diffusely increased renal cortical echogenicity. No concerning renal mass, shadowing calculus or hydronephrosis. Bladder: Appears normal for degree of  bladder distention. Other: None. IMPRESSION: 1. Bilaterally increased renal cortical echogenicity compatible with medical renal disease. 2. 9 mm nonobstructive calculus in the interpolar right  kidney. Electronically Signed   By: Lovena Le M.D.   On: 02/25/2020 23:18   IR Fluoro Guide CV Line Right  Result Date: 02/26/2020 INDICATION: Acute kidney injury EXAM: Non tunneled temporary hemodialysis catheter placement MEDICATIONS: None ANESTHESIA/SEDATION: None FLUOROSCOPY TIME:  Fluoroscopy Time: 0 minutes 24 seconds (1 mGy). COMPLICATIONS: None immediate. PROCEDURE: Informed written consent was obtained from the patient after a thorough discussion of the procedural risks, benefits and alternatives. All questions were addressed. Maximal Sterile Barrier Technique was utilized including caps, mask, sterile gowns, sterile gloves, sterile drape, hand hygiene and skin antiseptic. A timeout was performed prior to the initiation of the procedure. Right neck prepped and draped in the usual sterile fashion. All elements of maximal sterile barrier were utilized including, cap, mask, sterile gown, sterile gloves, large sterile drape, hand scrubbing and 2% Chlorhexidine for skin cleaning. The right internal jugular vein was evaluated with ultrasound and shown to be patent. A permanent ultrasound image was obtained and placed in the patient's medical record. Using sterile gel and a sterile probe cover, the right internal jugular vein was entered with a 21 ga needle during real time ultrasound guidance. 0.018 inch guidewire placed and 21 ga needle exchanged for transitional dilator set. Utilizing fluoroscopy, 0.035 inch guidewire advanced through the needle without difficulty. Serial dilation performed, and catheter inserted over the guidewire. The tip was positioned in the right atrium. All lumens of the catheter aspirated and flushed well. The dialysis lumens were locked with Heparin. The catheter was secured to the skin  with suture. The insertion site was covered with a Biopatch and sterile dressing. IMPRESSION: Right IJ temporary non tunneled hemodialysis catheter ready for use. Electronically Signed   By: Miachel Roux M.D.   On: 02/26/2020 13:50   IR US Guide Vasc Access Right  Result Date: 02/26/2020 INDICATION: Acute kidney injury EXAM: Non tunneled temporary hemodialysis catheter placement MEDICATIONS: None ANESTHESIA/SEDATION: None FLUOROSCOPY TIME:  Fluoroscopy Time: 0 minutes 24 seconds (1 mGy). COMPLICATIONS: None immediate. PROCEDURE: Informed written consent was obtained from the patient after a thorough discussion of the procedural risks, benefits and alternatives. All questions were addressed. Maximal Sterile Barrier Technique was utilized including caps, mask, sterile gowns, sterile gloves, sterile drape, hand hygiene and skin antiseptic. A timeout was performed prior to the initiation of the procedure. Right neck prepped and draped in the usual sterile fashion. All elements of maximal sterile barrier were utilized including, cap, mask, sterile gown, sterile gloves, large sterile drape, hand scrubbing and 2% Chlorhexidine for skin cleaning. The right internal jugular vein was evaluated with ultrasound and shown to be patent. A permanent ultrasound image was obtained and placed in the patient's medical record. Using sterile gel and a sterile probe cover, the right internal jugular vein was entered with a 21 ga needle during real time ultrasound guidance. 0.018 inch guidewire placed and 21 ga needle exchanged for transitional dilator set. Utilizing fluoroscopy, 0.035 inch guidewire advanced through the needle without difficulty. Serial dilation performed, and catheter inserted over the guidewire. The tip was positioned in the right atrium. All lumens of the catheter aspirated and flushed well. The dialysis lumens were locked with Heparin. The catheter was secured to the skin with suture. The insertion site was  covered with a Biopatch and sterile dressing. IMPRESSION: Right IJ temporary non tunneled hemodialysis catheter ready for use. Electronically Signed   By: Miachel Roux M.D.   On: 02/26/2020 13:50   DG CHEST PORT 1 VIEW  Result Date: 02/26/2020  CLINICAL DATA:  Ventilator dependent. EXAM: PORTABLE CHEST 1 VIEW COMPARISON:  Same day. FINDINGS: Stable cardiomediastinal silhouette. Endotracheal tube is in grossly good position. No pneumothorax or pleural effusion is noted. Both lungs are clear. The visualized skeletal structures are unremarkable. IMPRESSION: No active disease. Electronically Signed   By: Marijo Conception M.D.   On: 02/26/2020 15:58   DG Chest Port 1 View  Result Date: 02/26/2020 CLINICAL DATA:  65 year old female with history of hemodialysis catheter placement. Renal failure. EXAM: PORTABLE CHEST 1 VIEW COMPARISON:  Chest x-ray 01/28/2016. FINDINGS: New right IJ Vas-Cath with tip terminating in the right atrium. Lung volumes are low. No consolidative airspace disease. No pleural effusions. No pneumothorax. No pulmonary nodule or mass noted. Pulmonary vasculature is normal. Heart size is mildly enlarged. Upper mediastinal contours are within normal limits. IMPRESSION: 1. New right IJ Vas-Cath with tip terminating in the right atrium. No pneumothorax or other acute complicating features. 2. Mild cardiomegaly. Electronically Signed   By: Vinnie Langton M.D.   On: 02/26/2020 12:06   CT Renal Stone Study  Result Date: 02/25/2020 CLINICAL DATA:  Flank pain for the past week. EXAM: CT ABDOMEN AND PELVIS WITHOUT CONTRAST TECHNIQUE: Multidetector CT imaging of the abdomen and pelvis was performed following the standard protocol without IV contrast. COMPARISON:  None. FINDINGS: Lower chest: No acute abnormality. Hepatobiliary: No focal liver abnormality is seen. Status post cholecystectomy. No biliary dilatation. Pancreas: Unremarkable. No pancreatic ductal dilatation or surrounding inflammatory  changes. Spleen: Normal in size without focal abnormality. Adrenals/Urinary Tract: Adrenal glands and left kidney are unremarkable. 6 mm nonobstructive right renal calculus. No hydronephrosis. The bladder is unremarkable for the degree of distention. Stomach/Bowel: Stomach is within normal limits. Diminutive or absent appendix. No evidence of bowel wall thickening, distention, or inflammatory changes. Vascular/Lymphatic: No significant vascular findings are present. No enlarged abdominal or pelvic lymph nodes. Reproductive: Small calcified uterine fibroids.  No adnexal mass. Other: No abdominal wall hernia or abnormality. No abdominopelvic ascites. No pneumoperitoneum. Musculoskeletal: No acute or significant osseous findings. IMPRESSION: 1. No acute intra-abdominal process. 2. Nonobstructive right nephrolithiasis. Electronically Signed   By: Titus Dubin M.D.   On: 02/25/2020 19:39    Scheduled Meds: . sodium chloride   Intravenous Once  . Chlorhexidine Gluconate Cloth  6 each Topical Q0600  . docusate  100 mg Per Tube BID  . heparin sodium (porcine)      . hydrALAZINE  10 mg Oral Q8H  . insulin aspart  2-6 Units Subcutaneous Q4H  . lidocaine      . NIFEdipine  90 mg Oral q morning  . NIFEdipine  30 mg Oral QHS  . pantoprazole (PROTONIX) IV  40 mg Intravenous Q24H  . polyethylene glycol  17 g Per Tube Daily  . sevelamer carbonate  800 mg Oral TID WC   Continuous Infusions: .  prismasol BGK 4/2.5    .  prismasol BGK 4/2.5    . calcium gluconate 2,000 mg (02/26/20 1754)  . dexmedetomidine (PRECEDEX) IV infusion 0.6 mcg/kg/hr (02/26/20 1700)  . fentaNYL infusion INTRAVENOUS 50 mcg/hr (02/26/20 1700)  . prismasol BGK 4/2.5       LOS: 1 day   Marylu Lund, MD [facility] Pager On Amion  If 7PM-7AM, please contact night-coverage 02/26/2020, 6:44 PM

## 2020-02-26 NOTE — Progress Notes (Signed)
[facility] Progress Note Patient Name: Barbara Crawford DOB: Jul 28, 1955 MRN: [id number]   Date of Service  02/26/2020  HPI/Events of Note  Bedside RN requesting that enteral tube position be confirmed.  eICU Interventions  Okay to use enteral tube which is in the stomach on KUB film.        Frederik Pear 02/26/2020, 11:53 PM

## 2020-02-26 NOTE — ED Notes (Signed)
Pt provided meal tray per request 

## 2020-02-27 ENCOUNTER — Inpatient Hospital Stay (HOSPITAL_COMMUNITY): Payer: 59

## 2020-02-27 DIAGNOSIS — R569 Unspecified convulsions: Secondary | ICD-10-CM

## 2020-02-27 DIAGNOSIS — N179 Acute kidney failure, unspecified: Secondary | ICD-10-CM | POA: Diagnosis not present

## 2020-02-27 DIAGNOSIS — D649 Anemia, unspecified: Secondary | ICD-10-CM

## 2020-02-27 DIAGNOSIS — N19 Unspecified kidney failure: Secondary | ICD-10-CM

## 2020-02-27 DIAGNOSIS — Z9911 Dependence on respirator [ventilator] status: Secondary | ICD-10-CM | POA: Diagnosis not present

## 2020-02-27 LAB — ANA W/REFLEX IF POSITIVE: Anti Nuclear Antibody (ANA): POSITIVE — AB

## 2020-02-27 LAB — RENAL FUNCTION PANEL
Albumin: 1.4 g/dL — ABNORMAL LOW (ref 3.5–5.0)
Albumin: 1.7 g/dL — ABNORMAL LOW (ref 3.5–5.0)
Anion gap: 16 — ABNORMAL HIGH (ref 5–15)
Anion gap: 12 (ref 5–15)
BUN: 87 mg/dL — ABNORMAL HIGH (ref 8–23)
BUN: 53 mg/dL — ABNORMAL HIGH (ref 8–23)
CO2: 18 mmol/L — ABNORMAL LOW (ref 22–32)
CO2: 22 mmol/L (ref 22–32)
Calcium: 6.6 mg/dL — ABNORMAL LOW (ref 8.9–10.3)
Calcium: 7.4 mg/dL — ABNORMAL LOW (ref 8.9–10.3)
Chloride: 101 mmol/L (ref 98–111)
Chloride: 102 mmol/L (ref 98–111)
Creatinine, Ser: 17.4 mg/dL — ABNORMAL HIGH (ref 0.44–1.00)
Creatinine, Ser: 10.9 mg/dL — ABNORMAL HIGH (ref 0.44–1.00)
GFR, Estimated: 2 mL/min — ABNORMAL LOW (ref >60)
GFR, Estimated: 4 mL/min — ABNORMAL LOW (ref >60)
Glucose, Bld: 87 mg/dL (ref 70–99)
Glucose, Bld: 110 mg/dL — ABNORMAL HIGH (ref 70–99)
Phosphorus: 7.7 mg/dL — ABNORMAL HIGH (ref 2.5–4.6)
Phosphorus: 5 mg/dL — ABNORMAL HIGH (ref 2.5–4.6)
Potassium: 4.1 mmol/L (ref 3.5–5.1)
Potassium: 4.2 mmol/L (ref 3.5–5.1)
Sodium: 135 mmol/L (ref 135–145)
Sodium: 136 mmol/L (ref 135–145)

## 2020-02-27 LAB — ENA+DNA/DS+ANTICH+CENTRO+JO...
Anti JO-1: <0.2 AI (ref 0.0–0.9)
Centromere Ab Screen: 0.3 AI (ref 0.0–0.9)
Chromatin Ab SerPl-aCnc: 1.2 AI — ABNORMAL HIGH (ref 0.0–0.9)
ENA SM Ab Ser-aCnc: <0.2 AI (ref 0.0–0.9)
Ribonucleic Protein: 1.9 AI — ABNORMAL HIGH (ref 0.0–0.9)
SSA (Ro) (ENA) Antibody, IgG: >8 AI — ABNORMAL HIGH (ref 0.0–0.9)
SSB (La) (ENA) Antibody, IgG: 0.3 AI (ref 0.0–0.9)
Scleroderma (Scl-70) (ENA) Antibody, IgG: 1.9 AI — ABNORMAL HIGH (ref 0.0–0.9)
ds DNA Ab: <1 IU/mL (ref 0–9)

## 2020-02-27 LAB — BPAM RBC
Blood Product Expiration Date: 202203232359
Blood Product Expiration Date: 202203232359
ISSUE DATE / TIME: 202202231507
ISSUE DATE / TIME: 202202231655
Unit Type and Rh: 5100
Unit Type and Rh: 5100

## 2020-02-27 LAB — PTH, INTACT AND CALCIUM
Calcium, Total (PTH): 6.2 mg/dL — CL (ref 8.7–10.3)
PTH: 193 pg/mL — ABNORMAL HIGH (ref 15–65)

## 2020-02-27 LAB — TYPE AND SCREEN
ABO/RH(D): O POS
Antibody Screen: NEGATIVE
Unit division: 0
Unit division: 0

## 2020-02-27 LAB — CBC
HCT: 23.8 % — ABNORMAL LOW (ref 36.0–46.0)
Hemoglobin: 8.5 g/dL — ABNORMAL LOW (ref 12.0–15.0)
MCH: 28.9 pg (ref 26.0–34.0)
MCHC: 35.7 g/dL (ref 30.0–36.0)
MCV: 81 fL (ref 80.0–100.0)
Platelets: 192 10*3/uL (ref 150–400)
RBC: 2.94 MIL/uL — ABNORMAL LOW (ref 3.87–5.11)
RDW: 15.9 % — ABNORMAL HIGH (ref 11.5–15.5)
WBC: 6.4 10*3/uL (ref 4.0–10.5)
nRBC: 0 % (ref 0.0–0.2)

## 2020-02-27 LAB — C4 COMPLEMENT: Complement C4, Body Fluid: 35 mg/dL (ref 12–38)

## 2020-02-27 LAB — KAPPA/LAMBDA LIGHT CHAINS
Kappa free light chain: 354.7 mg/L — ABNORMAL HIGH (ref 3.3–19.4)
Kappa, lambda light chain ratio: 1.04 (ref 0.26–1.65)
Lambda free light chains: 339.9 mg/L — ABNORMAL HIGH (ref 5.7–26.3)

## 2020-02-27 LAB — LIPID PANEL
Cholesterol: 204 mg/dL — ABNORMAL HIGH (ref 0–200)
HDL: 52 mg/dL (ref >40)
LDL Cholesterol: 129 mg/dL — ABNORMAL HIGH (ref 0–99)
Total CHOL/HDL Ratio: 3.9 RATIO
Triglycerides: 115 mg/dL (ref <150)
VLDL: 23 mg/dL (ref 0–40)

## 2020-02-27 LAB — URINE CULTURE: Culture: NO GROWTH

## 2020-02-27 LAB — GLUCOSE, CAPILLARY
Glucose-Capillary: 107 mg/dL — ABNORMAL HIGH (ref 70–99)
Glucose-Capillary: 60 mg/dL — ABNORMAL LOW (ref 70–99)
Glucose-Capillary: 75 mg/dL (ref 70–99)
Glucose-Capillary: 78 mg/dL (ref 70–99)
Glucose-Capillary: 80 mg/dL (ref 70–99)
Glucose-Capillary: 91 mg/dL (ref 70–99)
Glucose-Capillary: 94 mg/dL (ref 70–99)

## 2020-02-27 LAB — HEPATITIS B SURFACE ANTIBODY, QUANTITATIVE: Hep B S AB Quant (Post): 6 m[IU]/mL — ABNORMAL LOW (ref >9.9)

## 2020-02-27 LAB — MAGNESIUM: Magnesium: 1.7 mg/dL (ref 1.7–2.4)

## 2020-02-27 LAB — C3 COMPLEMENT: C3 Complement: 117 mg/dL (ref 82–167)

## 2020-02-27 LAB — GLOMERULAR BASEMENT MEMBRANE ANTIBODIES: GBM Ab: 3 units (ref 0–20)

## 2020-02-27 LAB — ANTISTREPTOLYSIN O TITER: ASO: 35 IU/mL (ref 0.0–200.0)

## 2020-02-27 IMAGING — CT CT HEAD W/O CM
4 series · 16 of 47 positions shown, 18 images · non-contrast
Comparison: None.

CLINICAL DATA: 64-year-old female status post seizure. Status post
[GY] in [REDACTED]. Admitted with acute kidney failure.

EXAM:
CT HEAD WITHOUT CONTRAST
TECHNIQUE: Contiguous axial images were obtained from the base of the skull
through the vertex without intravenous contrast.

[Series 3: head wo · axial · 0.38mm/px · z∈[+1014,+1134]mm · 7 of 32 slices shown, 9 images]
[im 4/32  brain]
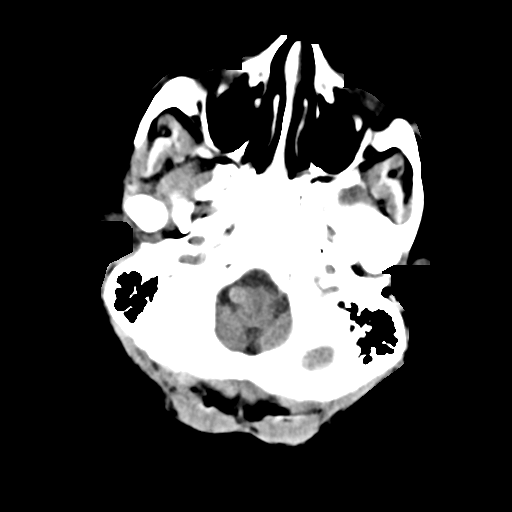
[im 4/32  bone]
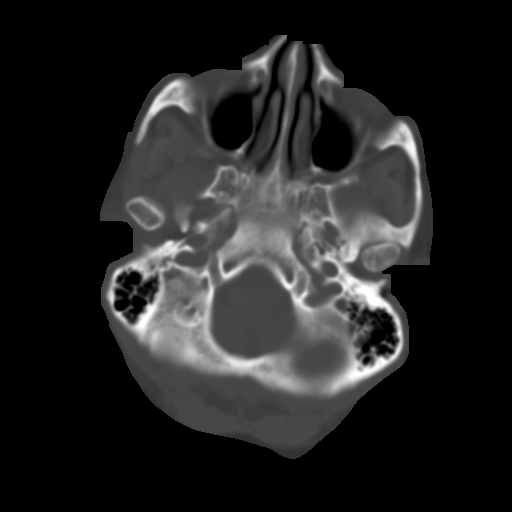
[im 8/32  brain]
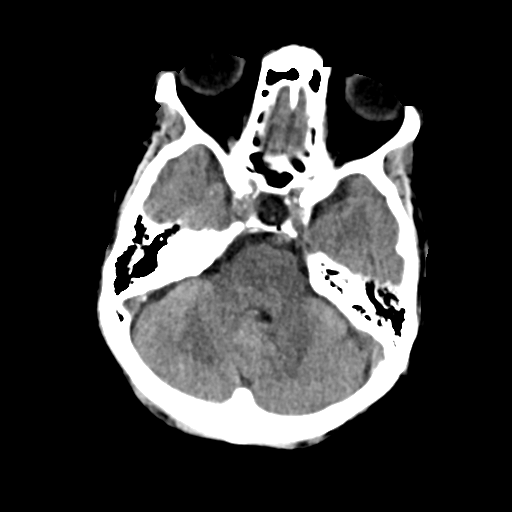
[im 12/32  brain]
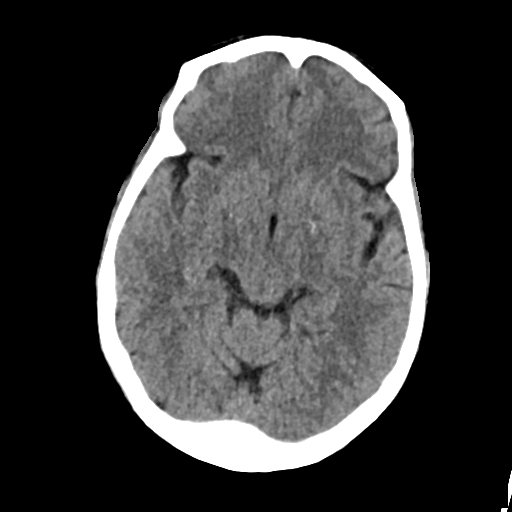
[im 16/32  brain]
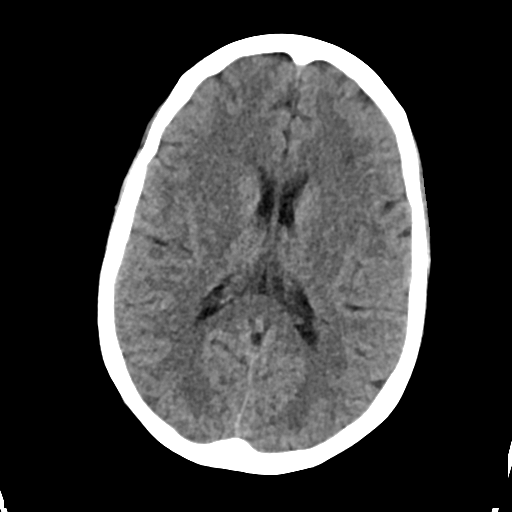
[im 20/32  brain]
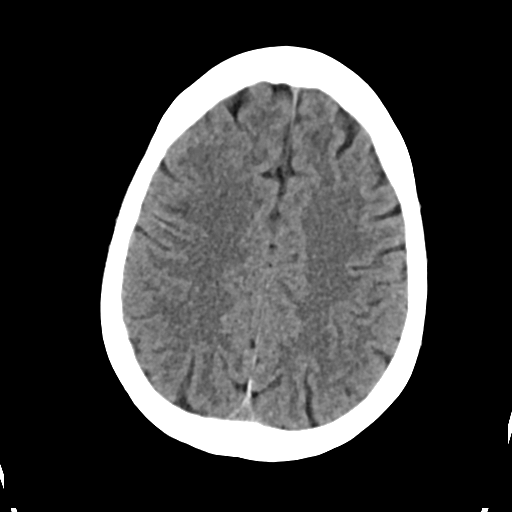
[im 20/32  bone]
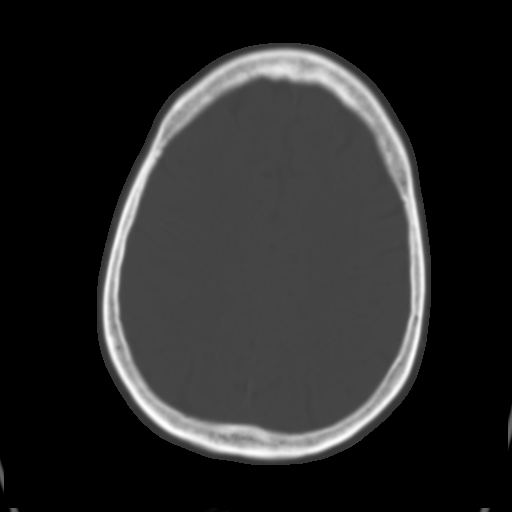
[im 24/32  brain]
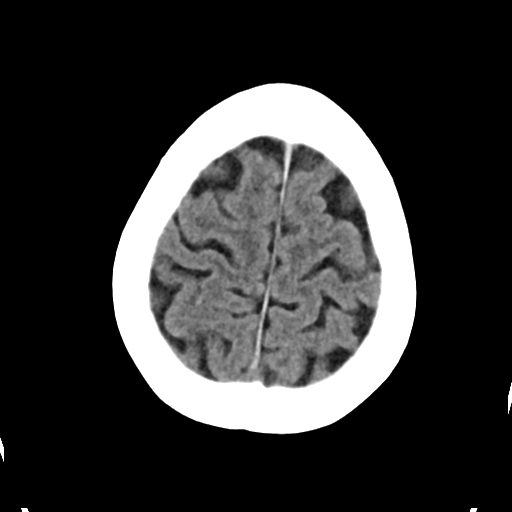
[im 28/32  brain]
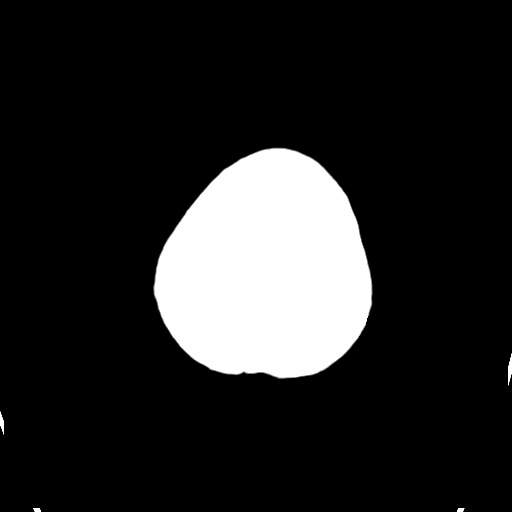

[Series 4: head bone · axial · 0.38mm/px · z∈[+1013,+1045]mm · 3 of 79 slices shown]
[im 8/79  bone]
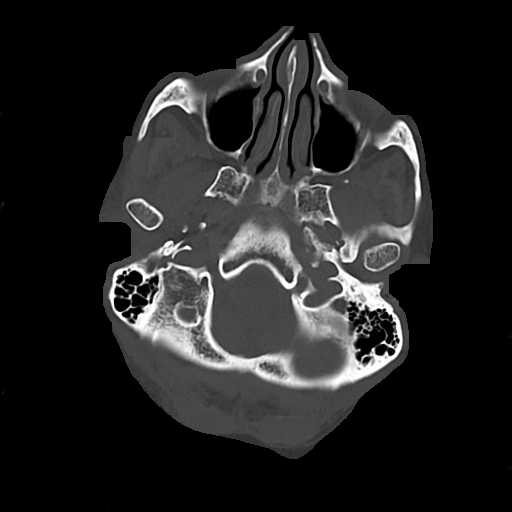
[im 16/79  bone]
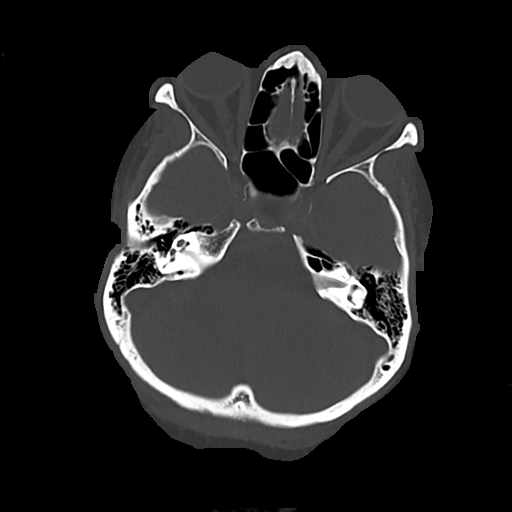
[im 24/79  bone]
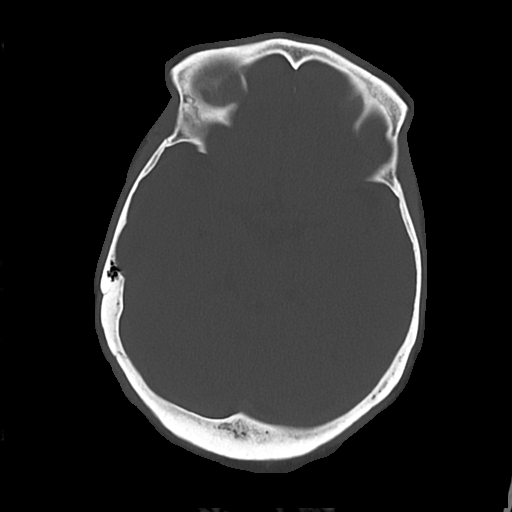

[Series 6: cor soft · coronal · 0.31mm/px · 3 of 62 slices shown]
[im 21/62  brain]
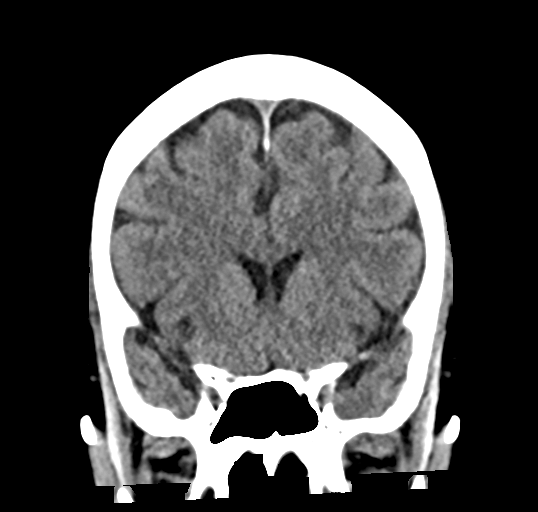
[im 28/62  brain]
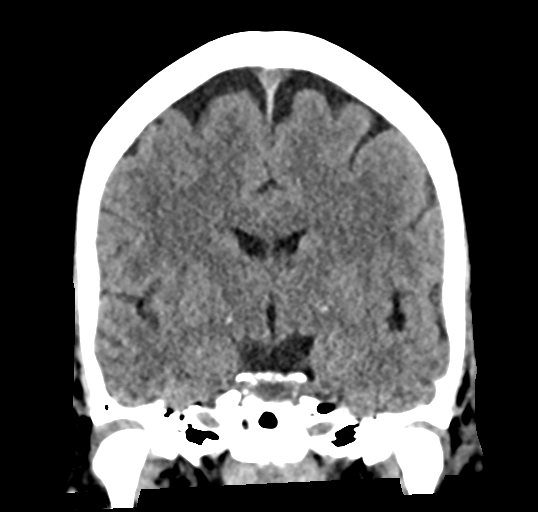
[im 34/62  brain]
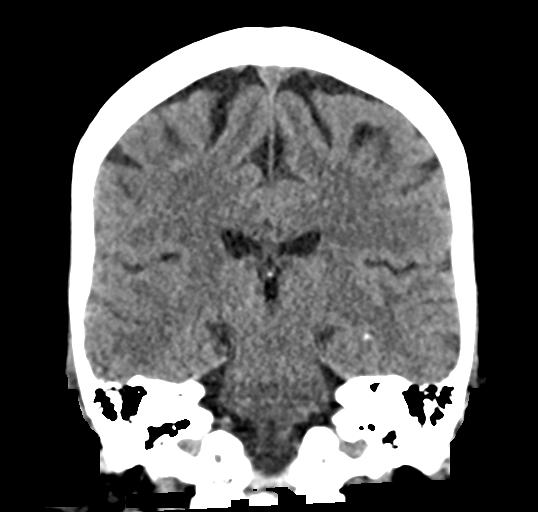

[Series 7: sag soft · sagittal · 0.31mm/px · 3 of 49 slices shown]
[im 17/49  brain]
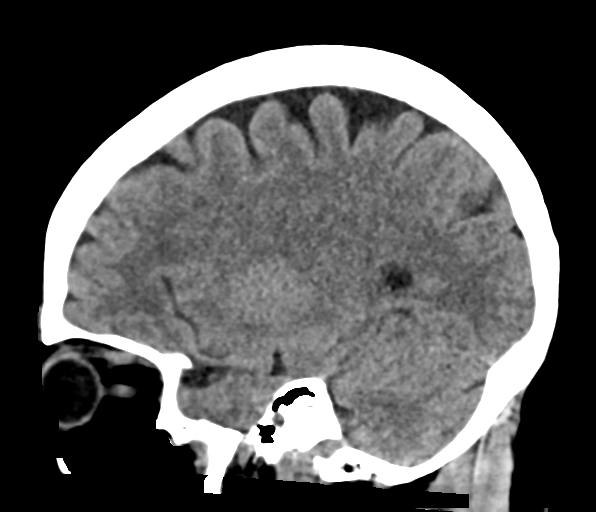
[im 25/49  brain]
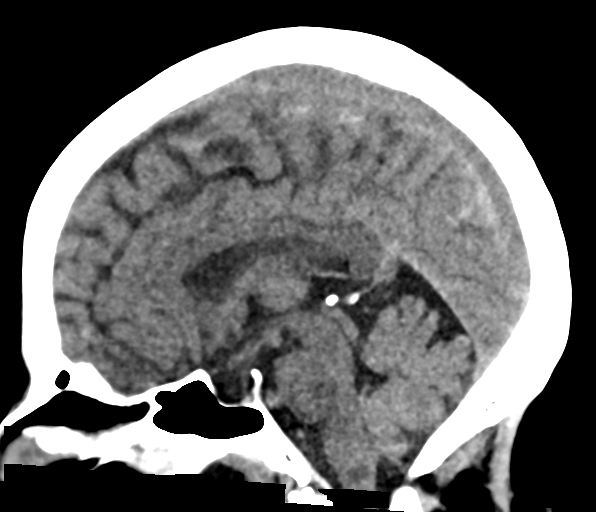
[im 33/49  brain]
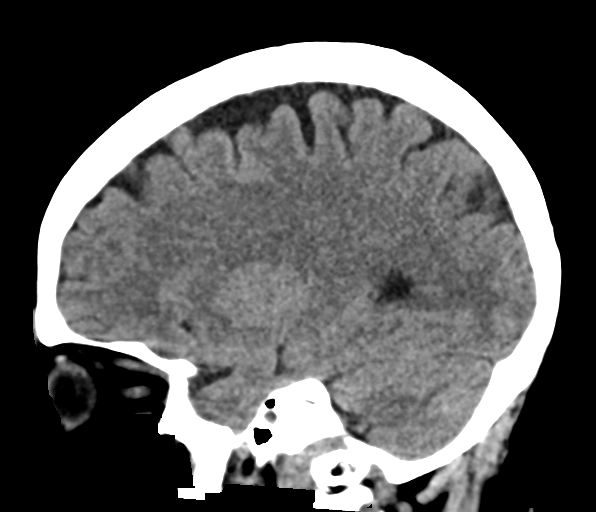

[16 of 47 positions shown; findings below may reference images not displayed]

FINDINGS: Brain: Mild dystrophic calcifications at the bilateral basal
ganglia. Cerebral volume is within normal limits for age. No midline
shift, ventriculomegaly, mass effect, evidence of mass lesion,
intracranial hemorrhage or evidence of cortically based acute
infarction. Minor subcortical white matter hypodensity in the
anterior left frontal lobe. Elsewhere gray-white matter
differentiation appears normal.

Vascular: Mild Calcified atherosclerosis at the skull base. No
suspicious intracranial vascular hyperdensity. There is a degree of
generalized intracranial artery tortuosity. The right vertebral
artery appears dominant.

Skull: Negative.

Sinuses/Orbits: Visualized paranasal sinuses and mastoids are clear.

Other: There is fluid layering in the nasopharynx. Unclear whether
the patient is intubated on the scout view. No acute orbit or scalp
soft tissue finding.
IMPRESSION: 1. No acute intracranial abnormality. Intracranial artery
tortuosity. Mild for age nonspecific white matter changes.
2. Fluid layering in the nasopharynx. Query if the patient is
intubated.

## 2020-02-27 IMAGING — MR MR HEAD W/O CM
15 of 17 series · 38 of 48 positions shown · non-contrast
Comparison: Head CT [T6] hours today.

CLINICAL DATA: 64-year-old female status post seizure. Status post
[T6] in [REDACTED]. Admitted with acute kidney failure.

EXAM:
MRI HEAD WITHOUT CONTRAST
MRA HEAD WITHOUT CONTRAST
TECHNIQUE: Multiplanar, multiecho pulse sequences of the brain and surrounding
structures were obtained without intravenous contrast. Angiographic
images of the head were obtained using MRA technique without
contrast.

[Series 5: DWI · axial · 3.0mm · 0.88mm/px · z∈[-23,+117]mm · 6 of 100 slices shown (1 of 4)]
[im 1/100]
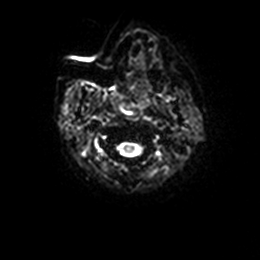
[im 20/100]
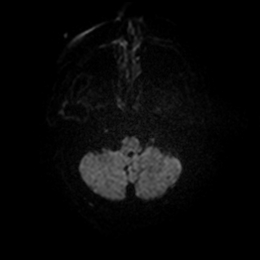
[im 40/100]
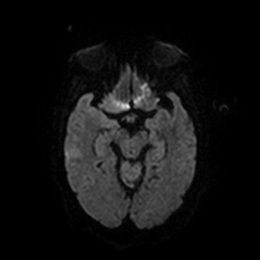
[im 60/100]
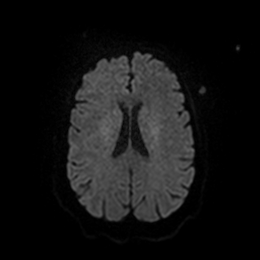
[im 80/100]
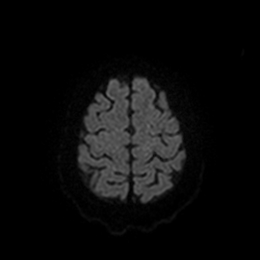
[im 100/100]
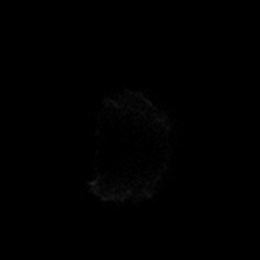

[Series 6: DWI · axial · 3.0mm · 0.88mm/px · z∈[-23,+117]mm · 3 of 50 slices shown (2 of 4)]
[im 1/50]
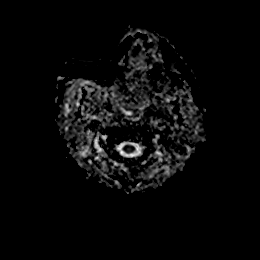
[im 25/50]
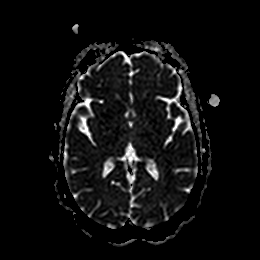
[im 50/50]
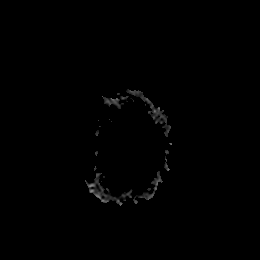

[Series 7: DWI · coronal · 4.0mm · 0.88mm/px · 3 of 64 slices shown (3 of 4)]
[im 1/64]
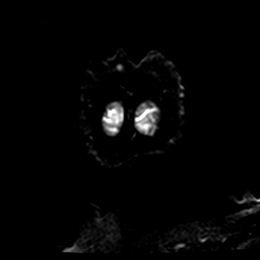
[im 32/64]
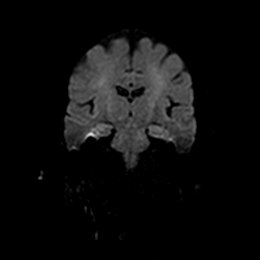
[im 64/64]
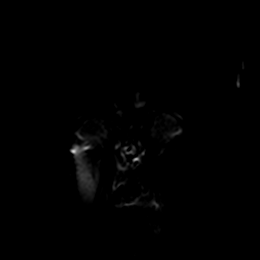

[Series 8: DWI · coronal · 4.0mm · 0.88mm/px · 2 of 32 slices shown (4 of 4)]
[im 1/32]
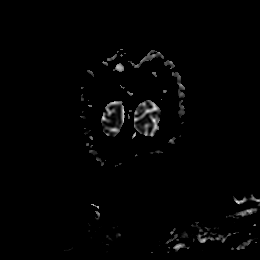
[im 32/32]
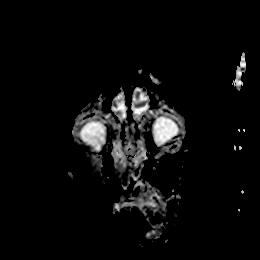

[Series 13: T1 · sagittal · 5.0mm · 0.75mm/px · 1 of 23 slices shown]
[im 1/23]
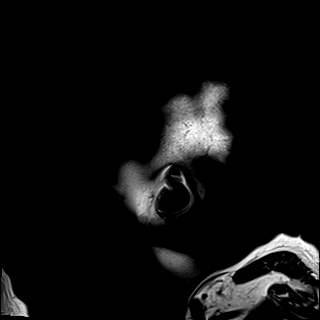

[Series 14: T2 · axial · 5.0mm · 0.72mm/px · 1 of 25 slices shown (1 of 3)]
[im 1/25]
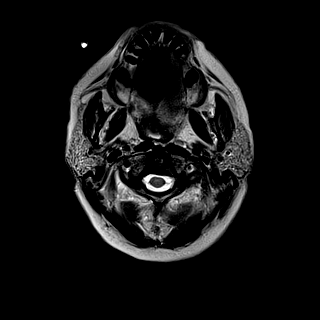

[Series 15: FLAIR · axial · 5.0mm · 0.45mm/px · 1 of 25 slices shown (1 of 2)]
[im 1/25]
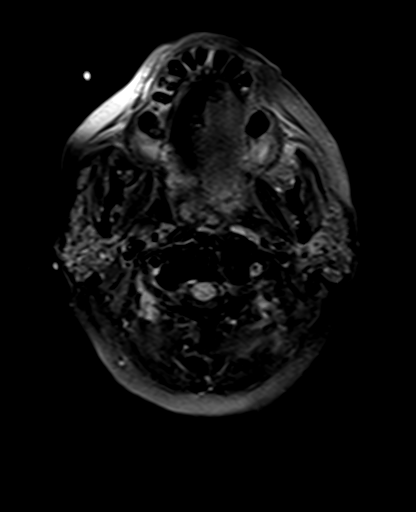

[Series 16: mag_images · axial · 3.0mm · 0.90mm/px · z∈[-30,+115]mm · 2 of 52 slices shown]
[im 1/52]
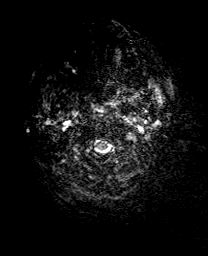
[im 52/52]
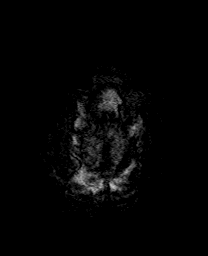

[Series 17: pha_images · axial · 3.0mm · 0.90mm/px · z∈[-30,+115]mm · 2 of 52 slices shown]
[im 1/52]
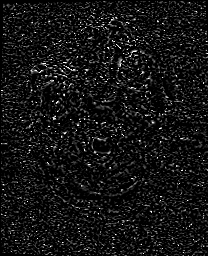
[im 52/52]
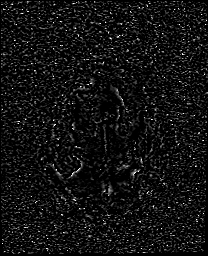

[Series 18: swi_images · axial · 3.0mm · 0.90mm/px · z∈[-30,+115]mm · 2 of 52 slices shown]
[im 1/52]
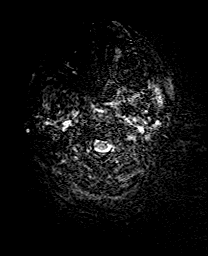
[im 52/52]
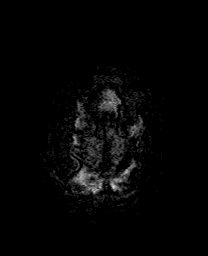

[Series 19: mip_images(sw) · axial · 24.0mm · 0.90mm/px · z∈[-20,+105]mm · 2 of 45 slices shown]
[im 1/45]
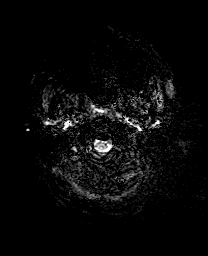
[im 45/45]
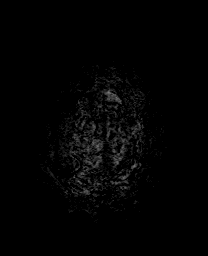

[Series 21: t1_mprage_tra_p2_iso · axial · 1.0mm · 0.98mm/px · z∈[-26,+140]mm · 8 of 176 slices shown]
[im 1/176]
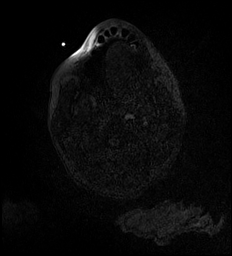
[im 26/176]
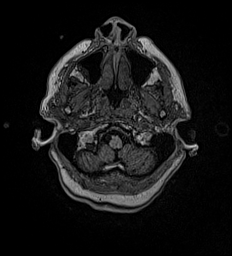
[im 51/176]
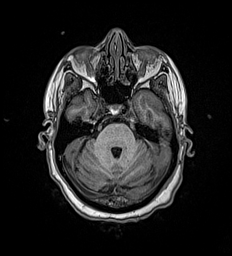
[im 76/176]
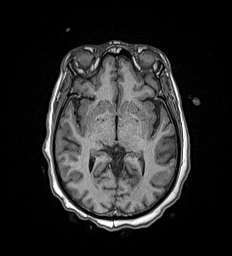
[im 101/176]
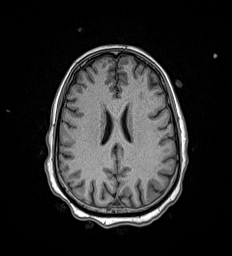
[im 126/176]
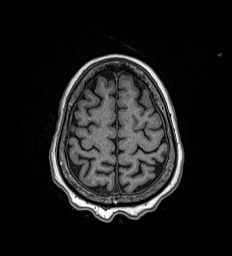
[im 151/176]
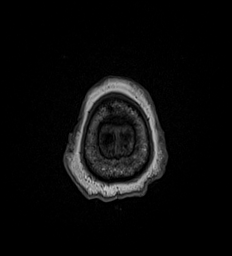
[im 176/176]
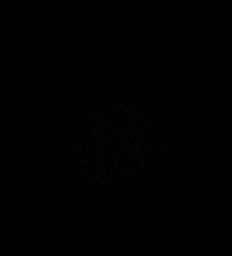

[Series 22: T2 · coronal · 3.0mm · 0.27mm/px · 2 of 32 slices shown (2 of 3)]
[im 1/32]
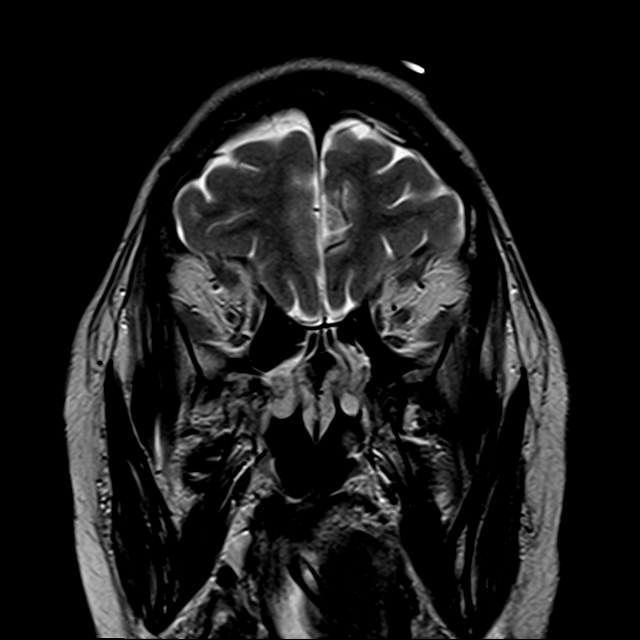
[im 32/32]
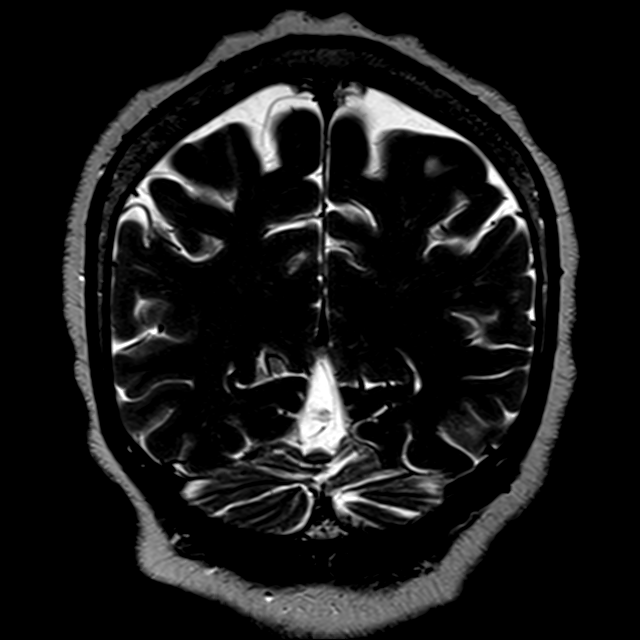

[Series 23: FLAIR · coronal · 3.0mm · 0.56mm/px · 2 of 32 slices shown (2 of 2)]
[im 1/32]
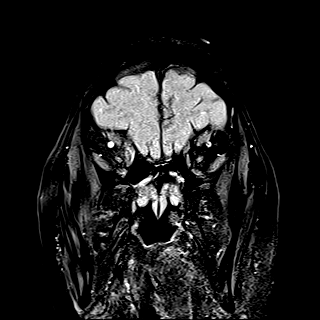
[im 32/32]
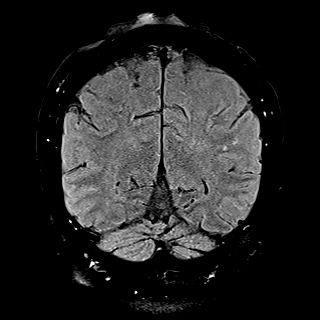

[Series 24: T2 · coronal · 5.0mm · 0.34mm/px · 1 of 29 slices shown (3 of 3)]
[im 1/29]
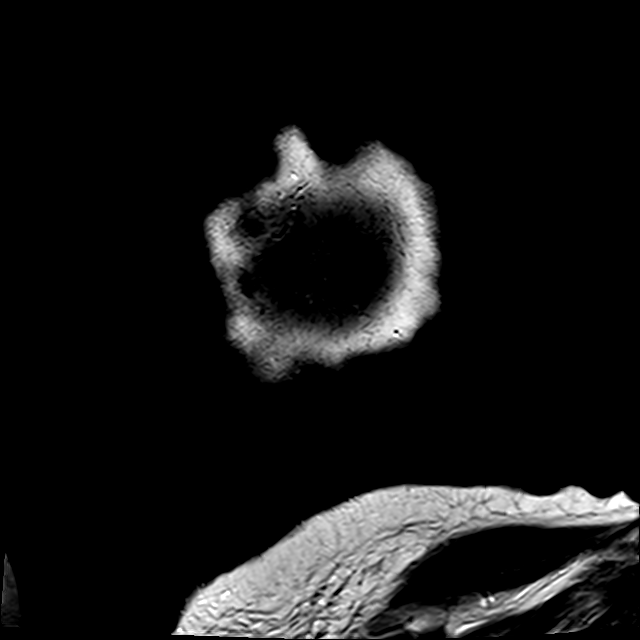

[38 of 48 positions shown; findings below may reference images not displayed]

FINDINGS: MRI HEAD FINDINGS

Brain: Patchy and indistinct symmetric abnormal cerebral white
matter signal on DWI which appears mildly restricted (series 5,
image 85 and series 6, image 35). No corresponding white matter T2
or FLAIR hyperintensity, although there is scattered small
subcortical white matter T2 and FLAIR signal which appears not
directly related.

No other restricted diffusion. No midline shift, mass effect,
evidence of mass lesion, ventriculomegaly, extra-axial collection or
acute intracranial hemorrhage. Cervicomedullary junction and
pituitary are within normal limits.

No cortical encephalomalacia. No chronic cerebral blood products.
Deep gray matter nuclei, brainstem and cerebellum appear normal.

Thin slice coronal temporal lobe imaging. Hippocampal formations
appear symmetric and within normal limits (series 22, image 11.
Other mesial temporal lobe structures appear within normal limits.

Vascular: Major intracranial vascular flow voids are preserved.
Tortuous intracranial arteries, including dominant right vertebral
artery.

Skull and upper cervical spine: Partially visible cervical spine
degeneration, up to mild C4-C5 degenerative spinal stenosis.
Visualized bone marrow signal is within normal limits.

Sinuses/Orbits: Negative orbits. Paranasal Visualized paranasal
sinuses and mastoids are stable and well pneumatized.

Other: Intubated with small volume fluid in the nasopharynx. Visible
internal auditory structures appear normal. Scalp soft tissues
appear negative.

MRA HEAD FINDINGS

Antegrade flow in the posterior circulation with tortuous dominant
right vertebral artery. Tortuous basilar artery. Normal PICA
origins. No vertebrobasilar stenosis. Patent SCA and PCA origins.
Posterior communicating arteries are present, the left is larger.
Bilateral PCA branches are normal aside from tortuosity.

Antegrade flow in both ICA siphons. No siphon stenosis. Ophthalmic
and posterior communicating artery origins are within normal limits.
Patent carotid termini. Dominant left ACA A1 segment. Anterior
communicating artery is normal. Visible ACA branches are within
normal limits. MCA M1 segments and bifurcations are tortuous but
patent without stenosis. Visible MCA branches are normal aside from
tortuosity.
IMPRESSION: 1. Symmetric patchy and indistinct central cerebral white matter
diffusion restriction, nonspecific.
Perhaps this is sequelae of Uremia or other metabolic derangement.
[T6] related demyelination is possible, although there is no
corresponding T2/FLAIR abnormality.
Sequelae of seizure activity was also considered but felt unlikely.

2. Otherwise largely unremarkable for age noncontrast MRI appearance
of the brain, ordinary scattered subcortical white matter signal
changes most commonly due to small vessel disease.

3. Intracranial MRA is negative aside from generalized arterial
tortuosity.

## 2020-02-27 IMAGING — MR MR MRA HEAD W/O CM
15 of 17 series · 38 of 48 positions shown · non-contrast
Comparison: Head CT [T6] hours today.

CLINICAL DATA: 64-year-old female status post seizure. Status post
[T6] in [REDACTED]. Admitted with acute kidney failure.

EXAM:
MRI HEAD WITHOUT CONTRAST
MRA HEAD WITHOUT CONTRAST
TECHNIQUE: Multiplanar, multiecho pulse sequences of the brain and surrounding
structures were obtained without intravenous contrast. Angiographic
images of the head were obtained using MRA technique without
contrast.

[Series 5: DWI · axial · 3.0mm · 0.88mm/px · z∈[-23,+117]mm · 6 of 100 slices shown (1 of 4)]
[im 1/100]
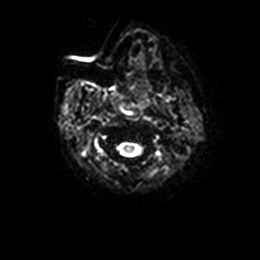
[im 20/100]
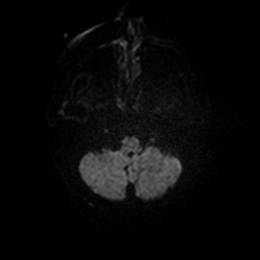
[im 40/100]
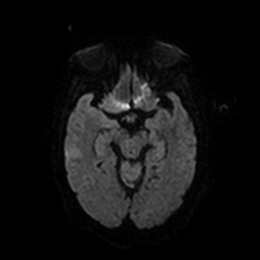
[im 60/100]
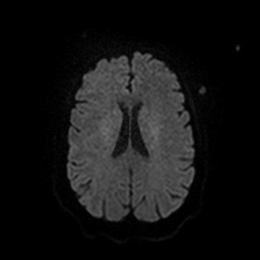
[im 80/100]
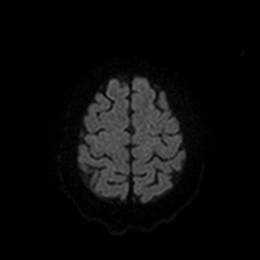
[im 100/100]
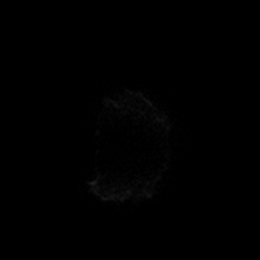

[Series 6: DWI · axial · 3.0mm · 0.88mm/px · z∈[-23,+117]mm · 3 of 50 slices shown (2 of 4)]
[im 1/50]
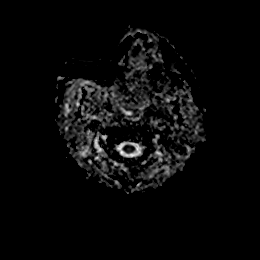
[im 25/50]
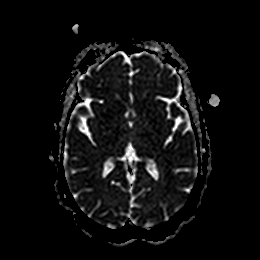
[im 50/50]
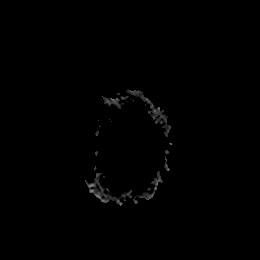

[Series 7: DWI · coronal · 4.0mm · 0.88mm/px · 3 of 64 slices shown (3 of 4)]
[im 1/64]
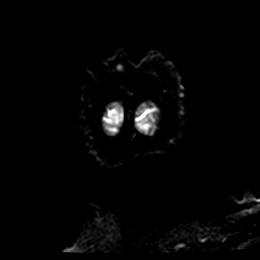
[im 32/64]
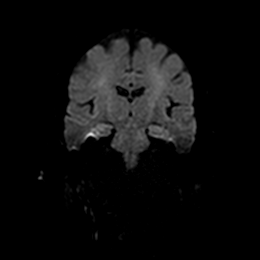
[im 64/64]
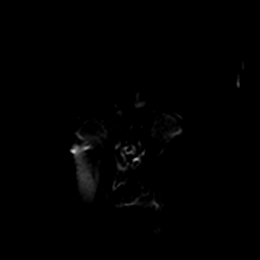

[Series 8: DWI · coronal · 4.0mm · 0.88mm/px · 2 of 32 slices shown (4 of 4)]
[im 1/32]
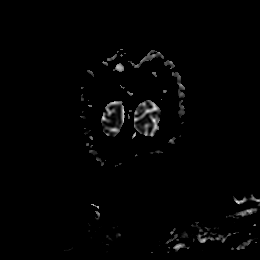
[im 32/32]
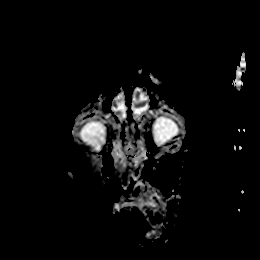

[Series 13: T1 · sagittal · 5.0mm · 0.75mm/px · 1 of 23 slices shown]
[im 1/23]
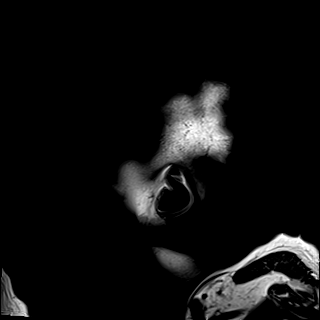

[Series 14: T2 · axial · 5.0mm · 0.72mm/px · 1 of 25 slices shown (1 of 3)]
[im 1/25]
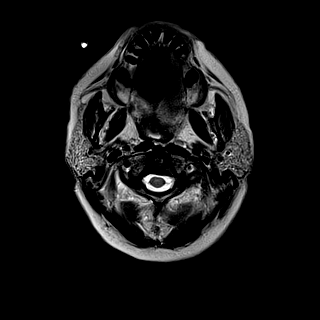

[Series 15: FLAIR · axial · 5.0mm · 0.45mm/px · 1 of 25 slices shown (1 of 2)]
[im 1/25]
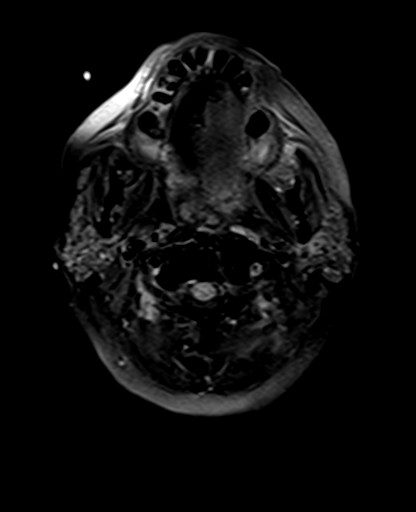

[Series 16: mag_images · axial · 3.0mm · 0.90mm/px · z∈[-30,+115]mm · 2 of 52 slices shown]
[im 1/52]
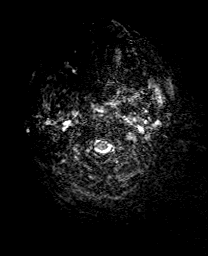
[im 52/52]
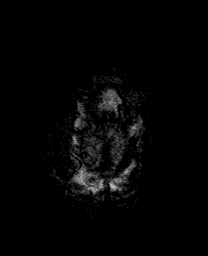

[Series 17: pha_images · axial · 3.0mm · 0.90mm/px · z∈[-30,+115]mm · 2 of 52 slices shown]
[im 1/52]
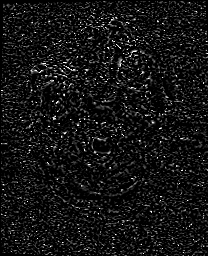
[im 52/52]
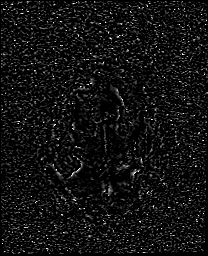

[Series 18: swi_images · axial · 3.0mm · 0.90mm/px · z∈[-30,+115]mm · 2 of 52 slices shown]
[im 1/52]
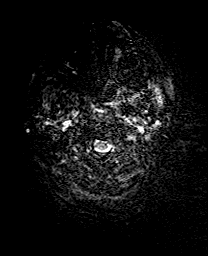
[im 52/52]
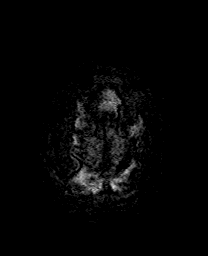

[Series 19: mip_images(sw) · axial · 24.0mm · 0.90mm/px · z∈[-20,+105]mm · 2 of 45 slices shown]
[im 1/45]
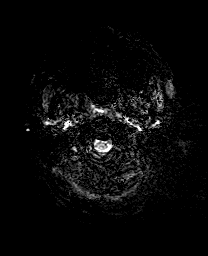
[im 45/45]
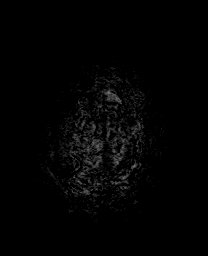

[Series 21: t1_mprage_tra_p2_iso · axial · 1.0mm · 0.98mm/px · z∈[-26,+140]mm · 8 of 176 slices shown]
[im 1/176]
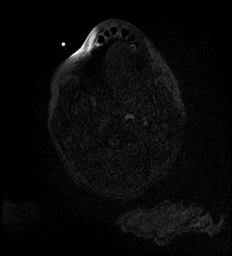
[im 26/176]
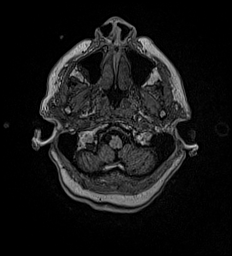
[im 51/176]
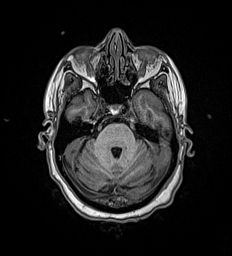
[im 76/176]
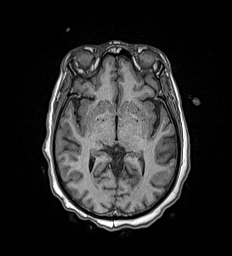
[im 101/176]
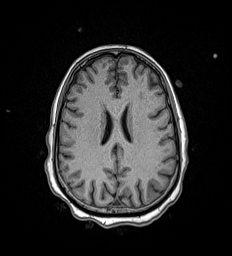
[im 126/176]
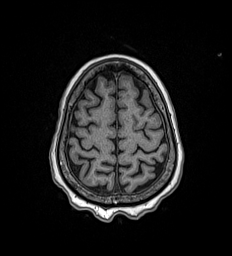
[im 151/176]
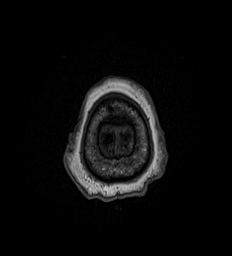
[im 176/176]
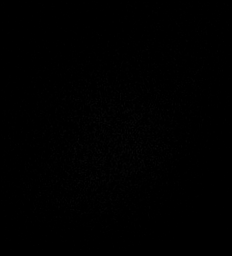

[Series 22: T2 · coronal · 3.0mm · 0.27mm/px · 2 of 32 slices shown (2 of 3)]
[im 1/32]
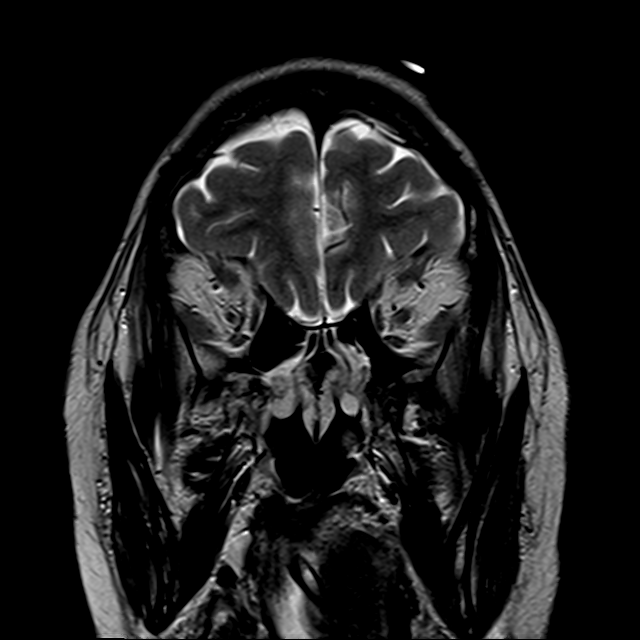
[im 32/32]
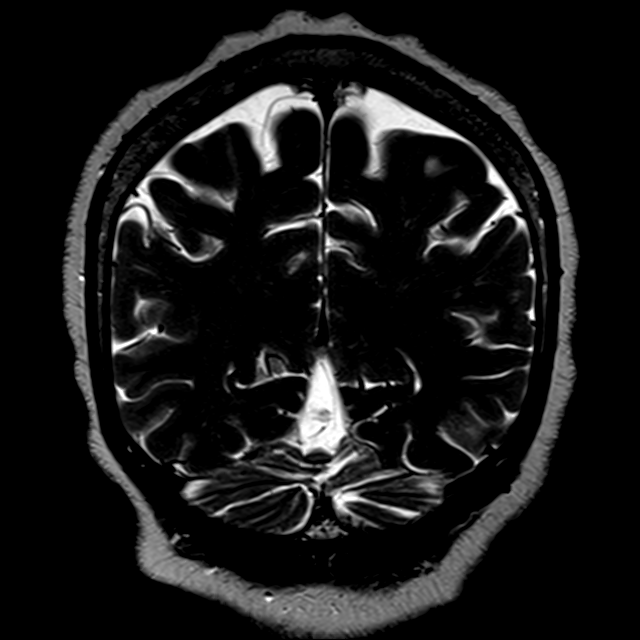

[Series 23: FLAIR · coronal · 3.0mm · 0.56mm/px · 2 of 32 slices shown (2 of 2)]
[im 1/32]
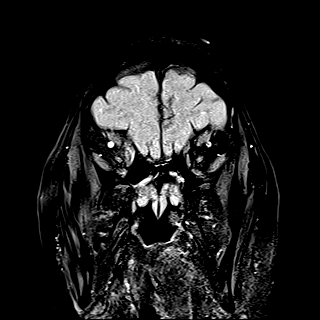
[im 32/32]
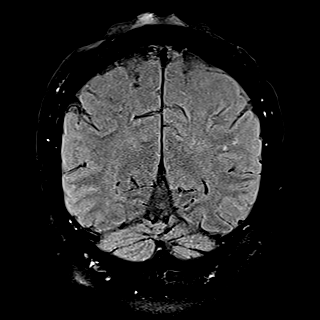

[Series 24: T2 · coronal · 5.0mm · 0.34mm/px · 1 of 29 slices shown (3 of 3)]
[im 1/29]
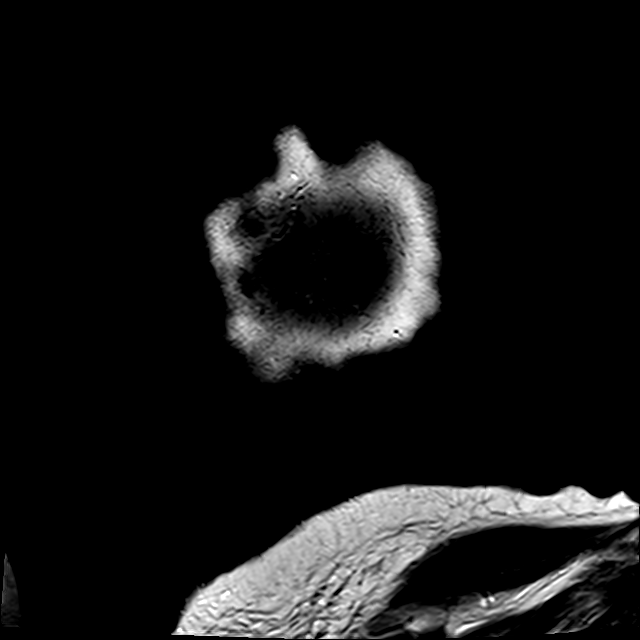

[38 of 48 positions shown; findings below may reference images not displayed]

FINDINGS: MRI HEAD FINDINGS

Brain: Patchy and indistinct symmetric abnormal cerebral white
matter signal on DWI which appears mildly restricted (series 5,
image 85 and series 6, image 35). No corresponding white matter T2
or FLAIR hyperintensity, although there is scattered small
subcortical white matter T2 and FLAIR signal which appears not
directly related.

No other restricted diffusion. No midline shift, mass effect,
evidence of mass lesion, ventriculomegaly, extra-axial collection or
acute intracranial hemorrhage. Cervicomedullary junction and
pituitary are within normal limits.

No cortical encephalomalacia. No chronic cerebral blood products.
Deep gray matter nuclei, brainstem and cerebellum appear normal.

Thin slice coronal temporal lobe imaging. Hippocampal formations
appear symmetric and within normal limits (series 22, image 11.
Other mesial temporal lobe structures appear within normal limits.

Vascular: Major intracranial vascular flow voids are preserved.
Tortuous intracranial arteries, including dominant right vertebral
artery.

Skull and upper cervical spine: Partially visible cervical spine
degeneration, up to mild C4-C5 degenerative spinal stenosis.
Visualized bone marrow signal is within normal limits.

Sinuses/Orbits: Negative orbits. Paranasal Visualized paranasal
sinuses and mastoids are stable and well pneumatized.

Other: Intubated with small volume fluid in the nasopharynx. Visible
internal auditory structures appear normal. Scalp soft tissues
appear negative.

MRA HEAD FINDINGS

Antegrade flow in the posterior circulation with tortuous dominant
right vertebral artery. Tortuous basilar artery. Normal PICA
origins. No vertebrobasilar stenosis. Patent SCA and PCA origins.
Posterior communicating arteries are present, the left is larger.
Bilateral PCA branches are normal aside from tortuosity.

Antegrade flow in both ICA siphons. No siphon stenosis. Ophthalmic
and posterior communicating artery origins are within normal limits.
Patent carotid termini. Dominant left ACA A1 segment. Anterior
communicating artery is normal. Visible ACA branches are within
normal limits. MCA M1 segments and bifurcations are tortuous but
patent without stenosis. Visible MCA branches are normal aside from
tortuosity.
IMPRESSION: 1. Symmetric patchy and indistinct central cerebral white matter
diffusion restriction, nonspecific.
Perhaps this is sequelae of Uremia or other metabolic derangement.
[T6] related demyelination is possible, although there is no
corresponding T2/FLAIR abnormality.
Sequelae of seizure activity was also considered but felt unlikely.

2. Otherwise largely unremarkable for age noncontrast MRI appearance
of the brain, ordinary scattered subcortical white matter signal
changes most commonly due to small vessel disease.

3. Intracranial MRA is negative aside from generalized arterial
tortuosity.

## 2020-02-27 MED ORDER — ONDANSETRON HCL 4 MG/2ML IJ SOLN
4.0000 mg | Freq: Four times a day (QID) | INTRAMUSCULAR | Status: DC | PRN
  Administered 2020-02-27: 4 mg via INTRAVENOUS
  Filled 2020-02-27: qty 2

## 2020-02-27 MED ORDER — HYDROCODONE-ACETAMINOPHEN 5-325 MG PO TABS: 1.0000 | ORAL_TABLET | ORAL | Status: DC | PRN

## 2020-02-27 MED ORDER — FENTANYL BOLUS VIA INFUSION
50.0000 ug | INTRAVENOUS | Status: DC | PRN
  Administered 2020-02-27: 100 ug via INTRAVENOUS
  Administered 2020-02-27: 50 ug via INTRAVENOUS
  Administered 2020-02-27: 25 ug via INTRAVENOUS
  Administered 2020-02-27: 50 ug via INTRAVENOUS
  Administered 2020-02-28: 100 ug via INTRAVENOUS
  Filled 2020-02-27: qty 200

## 2020-02-27 MED ORDER — PANTOPRAZOLE SODIUM 40 MG PO PACK
40.0000 mg | PACK | Freq: Every day | ORAL | Status: DC
  Administered 2020-02-27: 40 mg
  Filled 2020-02-27: qty 20

## 2020-02-27 MED ORDER — DEXTROSE 50 % IV SOLN
INTRAVENOUS | Status: AC
  Administered 2020-02-27: 25 mL via INTRAVENOUS
  Filled 2020-02-27: qty 50

## 2020-02-27 MED ORDER — SODIUM CHLORIDE 0.9 % IV SOLN
1250.0000 mg | Freq: Two times a day (BID) | INTRAVENOUS | Status: DC
  Filled 2020-02-27: qty 12.5

## 2020-02-27 MED ORDER — SODIUM CHLORIDE 0.9 % IV BOLUS
500.0000 mL | Freq: Once | INTRAVENOUS | Status: AC
  Administered 2020-02-27: 500 mL via INTRAVENOUS

## 2020-02-27 MED ORDER — HYDRALAZINE HCL 10 MG PO TABS
10.0000 mg | ORAL_TABLET | Freq: Three times a day (TID) | ORAL | Status: DC
  Administered 2020-02-27: 10 mg
  Filled 2020-02-27: qty 1

## 2020-02-27 MED ORDER — DEXTROSE 50 % IV SOLN: 25.0000 mL | Freq: Once | INTRAVENOUS | Status: AC

## 2020-02-27 MED ORDER — CALCIUM GLUCONATE-NACL 1-0.675 GM/50ML-% IV SOLN
1.0000 g | Freq: Once | INTRAVENOUS | Status: AC
  Administered 2020-02-27: 1000 mg via INTRAVENOUS
  Filled 2020-02-27: qty 50

## 2020-02-27 MED ORDER — MAGNESIUM SULFATE 2 GM/50ML IV SOLN
2.0000 g | Freq: Once | INTRAVENOUS | Status: AC
  Administered 2020-02-27: 2 g via INTRAVENOUS
  Filled 2020-02-27: qty 50

## 2020-02-27 MED ORDER — ALBUMIN HUMAN 25 % IV SOLN
12.5000 g | Freq: Once | INTRAVENOUS | Status: AC
  Administered 2020-02-27: 12.5 g via INTRAVENOUS
  Filled 2020-02-27: qty 50

## 2020-02-27 MED ORDER — LACTATED RINGERS IV BOLUS
500.0000 mL | Freq: Once | INTRAVENOUS | Status: AC
  Administered 2020-02-27: 500 mL via INTRAVENOUS

## 2020-02-27 MED ORDER — MELATONIN 3 MG PO TABS: 3.0000 mg | ORAL_TABLET | Freq: Every evening | ORAL | Status: DC | PRN

## 2020-02-27 MED ORDER — HYDRALAZINE HCL 20 MG/ML IJ SOLN: 10.0000 mg | INTRAMUSCULAR | Status: DC | PRN

## 2020-02-27 MED ORDER — LABETALOL HCL 5 MG/ML IV SOLN
5.0000 mg | INTRAVENOUS | Status: DC | PRN
  Administered 2020-02-29 – 2020-03-04 (×6): 5 mg via INTRAVENOUS
  Filled 2020-02-27 (×4): qty 4

## 2020-02-27 MED ORDER — LEVETIRACETAM 100 MG/ML PO SOLN
1250.0000 mg | Freq: Two times a day (BID) | ORAL | Status: DC
  Administered 2020-02-27 – 2020-02-28 (×2): 1250 mg
  Filled 2020-02-27 (×2): qty 15

## 2020-02-27 MED ORDER — CHLORHEXIDINE GLUCONATE CLOTH 2 % EX PADS
6.0000 | MEDICATED_PAD | Freq: Every day | CUTANEOUS | Status: DC
  Administered 2020-02-27 – 2020-03-10 (×9): 6 via TOPICAL

## 2020-02-27 MED ORDER — INSULIN ASPART 100 UNIT/ML ~~LOC~~ SOLN: 0.0000 [IU] | SUBCUTANEOUS | Status: DC

## 2020-02-27 MED ORDER — SODIUM CHLORIDE 0.9 % IV SOLN
2000.0000 mg | Freq: Once | INTRAVENOUS | Status: AC
  Administered 2020-02-27: 2000 mg via INTRAVENOUS
  Filled 2020-02-27: qty 20

## 2020-02-27 MED ORDER — SODIUM CHLORIDE 0.9 % IV SOLN
INTRAVENOUS | Status: DC | PRN
  Administered 2020-02-27: 250 mL via INTRAVENOUS

## 2020-02-27 MED ORDER — AMLODIPINE BESYLATE 10 MG PO TABS
10.0000 mg | ORAL_TABLET | Freq: Every day | ORAL | Status: DC
  Administered 2020-02-27: 10 mg via ORAL
  Filled 2020-02-27: qty 1

## 2020-02-27 MED ORDER — MELATONIN 3 MG PO TABS
3.0000 mg | ORAL_TABLET | Freq: Every evening | ORAL | Status: DC | PRN
  Administered 2020-02-27: 3 mg via ORAL
  Filled 2020-02-27: qty 1

## 2020-02-27 MED ORDER — AMLODIPINE BESYLATE 10 MG PO TABS: 10.0000 mg | ORAL_TABLET | Freq: Every day | ORAL | Status: DC

## 2020-02-27 MED ORDER — HEPARIN SODIUM (PORCINE) 5000 UNIT/ML IJ SOLN
5000.0000 [IU] | Freq: Three times a day (TID) | INTRAMUSCULAR | Status: AC
  Administered 2020-02-27 – 2020-03-01 (×12): 5000 [IU] via SUBCUTANEOUS
  Filled 2020-02-27 (×12): qty 1

## 2020-02-27 MED ORDER — ONDANSETRON HCL 4 MG PO TABS: 4.0000 mg | ORAL_TABLET | Freq: Four times a day (QID) | ORAL | Status: DC | PRN

## 2020-02-27 MED ORDER — NOREPINEPHRINE 4 MG/250ML-% IV SOLN
0.0000 ug/min | INTRAVENOUS | Status: DC
  Administered 2020-02-27: 2 ug/min via INTRAVENOUS
  Filled 2020-02-27: qty 250

## 2020-02-27 MED ORDER — VITAL 1.5 CAL PO LIQD
1000.0000 mL | ORAL | Status: DC
  Administered 2020-02-27: 1000 mL

## 2020-02-27 MED ORDER — PROSOURCE TF PO LIQD
90.0000 mL | Freq: Three times a day (TID) | ORAL | Status: DC
  Administered 2020-02-27 (×3): 90 mL
  Filled 2020-02-27 (×3): qty 90

## 2020-02-27 NOTE — Consult Note (Signed)
Neurology Consultation Reason for Consult: Seizures  Requesting Physician: Trevor Mace  History is obtained from: Chart review and primary team  HPI: Barbara Crawford is a 64 y.o. female with past medical history significant for hypertension and COVID-19 in December (unvaccianted), who presented initially with lower extremity edema and left flank pain, found to be in acute renal failure with creatinine of 29.98, BUN of 144, and hemoglobin of 6.3 felt to be chronic.  She was initially admitted to the floor but then had seizure-like activity as they were attempting to admit her to the floor.  She had just transferred over to the bed from stretcher and with a minute began having generalized convulsions.  This was complicated by cardiac arrest (no pulse) requiring CPR briefly (4 minutes, no medications) for which she was intubated and transferred to the ICU.  Subsequently she was intubated.  She had a second episode of generalized tonic-clonic convulsions witnessed by nursing staff (2:13 2 2:17 AM). Nursing reports she was initially feeling cold and was provided warm blanket.  She began to mouth that she felt hot just before she seized again, for 4 minutes aborted with 2 mg of Ativan.  Heart rates went up to the 200s but there was no concern for loss of pulse.  She had been sedated on Precedex and fentanyl.  After the second event she was slow to return to responsiveness.  Neurology is consulted for recurrent seizures.  Head CT is pending and she has been started on CRRT.  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  However per chart review she has been having lower back pain and decreased voiding for about a month as well as lower back pain and decreased voiding for about a month, fatigue, lower extremity edema for 1 week, constipation, and tooth infection for 1 year that has not been addressed due  to inability to afford extraction.  She has had some renal dysfunction ongoing since at least December 2021 with referral to nephrology back then.  ROS: Unable to obtain due to altered mental status.   Past Medical History:  Diagnosis Date  . Hypertension    No family history on file. Unable to assess secondary to patient's mental status   Social History:  reports that she has never smoked. She does not have any smokeless tobacco history on file. She reports current alcohol use. She reports that she does not use drugs.  Exam: Current vital signs: BP 122/86   Pulse 96   Temp (!) 97.3 F (36.3 C) (Oral)   Resp (!) 0   Ht 5' 2.99" (1.6 m)   Wt 73.6 kg   SpO2 100%   BMI 28.75 kg/m  Vital signs in last 24 hours: Temp:  [96.2 F (35.7 C)-99.1 F (37.3 C)] 97.3 F (36.3 C) (02/24 0334) Pulse Rate:  [51-141] 96 (02/24 0230) Resp:  [0-34] 0 (02/24 0300) BP: (99-200)/(63-119) 122/86 (02/24 0300) SpO2:  [97 %-100 %] 100 % (02/24 0335) FiO2 (%):  [30 %] 30 % (02/24 0335) Weight:  [70.4 kg-73.6 kg] 73.6 kg (02/23 1835)   Physical Exam  Constitutional: Appears well-developed and well-nourished.  Psych: Nonresponsive Eyes: No scleral injection HENT: ET tube in place MSK: no joint deformities.  Cardiovascular: Normal rate and regular rhythm.  Respiratory: Effort normal, non-labored breathing GI: Soft.  No distension. There is no tenderness.  Skin: Warm dry and intact  visible skin  Neuro: Mental Status: Obtunded.  Does not make any attempt to verbalize.  Does not follow any commands. Cranial Nerves: II: No blink to threat. Pupils are equal, round, and reactive to light.  1.25 -> 0.75 mm bilaterally III,IV, VI: Sluggish VOR's V/VII: Facial sensation and movement are symmetric to eyelash brush  VIII: No clear response to voice Unable to assess remaining CN secondary to patient's mental status  Motor: Tone is normal. Bulk is normal.  Initially she does not have any movement to  noxious stimulation in any of her 4 extremities.  However immediately after CT scan she does have slight flexion/complex movements of bilateral feet to maximal noxious stimulus Deep Tendon Reflexes: 3+ and symmetric in the biceps and patellae Plantars:  Toes are mute bilaterally.  Cerebellar: Unable to assess secondary to patient's mental status   I have reviewed labs in epic and the results pertinent to this consultation are: Normal PT/INR, initial hemoglobin of 6 0.3 which improved to 8.7 with 2 units, increased RDW ESR greater than 140 Creatinine on arrival 29.68 with BUN of 148, glucose 119, mild hyponatremia to 132, low albumin at 1.6, and high serum uric acid at 9.1.  Electrolytes notable for low calcium of 6.3 which corrects to normal given her low albumin; however, severely reduced iCal at 0.73 at 17:00 also noted  I have reviewed the images obtained: Head CT without acute intracranial process  Spot EEG briefly reviewed personally, negative for ongoing status epilepticus.   Impression: This is a 65 year old woman with a past medical history significant for recent COVID-19 infection and hypertension presenting with renal failure currently of unclear etiology, complicated by seizure and loss of pulse s/p CPR with initial good return to baseline.  However she has not had a second seizure event.  Head CT is unrevealing, will next obtain MRI imaging to evaluate for potential stroke or other structural lesion that could be driving these events.  It is unclear if the activity is purely generalized versus with a focal onset given she reported feeling hot initially per nursing; the latter is more concerning for potential epilepsy. As she was starting to improve in responsiveness on my evaluation she may not need urgent EEG overnight, will re-evaluate after MRI is completed.  - Most likely seizure in the setting of metabolic derangements  Recommendations: - Follow up formal read of EEG tomorrow  by Dr. Hortense Ramal - MRI brain, MRA pending - Keppra 2000 mg load followed by 1250 BID while on CRRT, will need to be adjusted PRN dialysis format / renal function - Appreciate nephrology following - Appreciate CCM team management of other comorbidities   Lesleigh Noe MD-PhD [facility] [phone] Available 7 PM to 7 AM, outside of these hours please call Neurologist on call as listed on Amion.  Total critical care time: 40 minutes Critical care time was exclusive of separately billable procedures and treating other patients. Critical care was necessary to treat or prevent imminent or life-threatening deterioration. Critical care was time spent personally by me on the following activities: development of treatment plan with patient and/or surrogate as well as nursing, discussions with consultants/primary team, evaluation of patient's response to treatment, examination of patient, obtaining history from patient or surrogate, ordering and performing treatments and interventions, ordering and review of laboratory studies, ordering and review of radiographic studies, and re-evaluation of patient's condition as needed, as documented above.

## 2020-02-27 NOTE — Progress Notes (Signed)
Initial Nutrition Assessment  DOCUMENTATION CODES:   Not applicable  INTERVENTION:   Initiate tube feeding via OG tube: Vital 1.5 at 40 ml/h (960 ml per day) Prosource TF 90 ml TID  Provides 1680 kcal, 131 gm protein, 733 ml free water daily.  NUTRITION DIAGNOSIS:   Increased nutrient needs related to acute illness (renal failure requiring CRRT) as evidenced by estimated needs.  GOAL:   Patient will meet greater than or equal to 90% of their needs  MONITOR:   TF tolerance,Vent status,Labs  REASON FOR ASSESSMENT:   Consult Enteral/tube feeding initiation and management  ASSESSMENT:   65 yo female admitted with acute renal failure complicated by seizure and cardiac arrest requiring intubation on 2/23. PMH includes HTN, recent COVID infection Dec 2021.   Discussed patient in ICU rounds and with RN today. Currently requiring CRRT. Received MD Consult for TF initiation and management. OG tube in place.  Patient is currently intubated on ventilator support MV: 11 L/min Temp (24hrs), Avg:97.6 F (36.4 C), Min:95.6 F (35.3 C), Max:99.1 F (37.3 C)   Labs reviewed. BUN 87, creat 17.4, phos 7.7, corrected calcium 8.68 CBG: 94  Medications reviewed and include Precedex, Levophed.   No recent weight history available for review. Currently fluid overloaded with non-pitting edema to BUE and BLE.  NUTRITION - FOCUSED PHYSICAL EXAM:  Flowsheet Row Most Recent Value  Orbital Region No depletion  Upper Arm Region No depletion  Thoracic and Lumbar Region No depletion  Buccal Region Unable to assess  Temple Region No depletion  Clavicle Bone Region Unable to assess  Clavicle and Acromion Bone Region Unable to assess  Scapular Bone Region Unable to assess  Dorsal Hand Unable to assess  Patellar Region No depletion  Anterior Thigh Region No depletion  Posterior Calf Region No depletion  Edema (RD Assessment) Mild  Hair Reviewed  Eyes Unable to assess  Mouth Unable  to assess  Skin Reviewed  Nails Reviewed       Diet Order:   Diet Order            Diet NPO time specified  Diet effective now                 EDUCATION NEEDS:   Not appropriate for education at this time  Skin:  Skin Assessment: Reviewed RN Assessment  Last BM:  2/22  Height:   Ht Readings from Last 1 Encounters:  02/26/20 5' 2.99" (1.6 m)    Weight:   Wt Readings from Last 1 Encounters:  02/27/20 74.5 kg    Ideal Body Weight:  52.3 kg  BMI:  Body mass index is 29.1 kg/m.  Estimated Nutritional Needs:   Kcal:  1580  Protein:  120-140 g  Fluid:  >/= 1.6 L    Lucas Mallow, RD, LDN, CNSC Please refer to Amion for contact information.

## 2020-02-27 NOTE — Procedures (Signed)
Patient Name: Barbara Crawford  MRN: [id number]  Epilepsy Attending: Lora Havens  Referring Physician/Provider: Dr Marianna Payment Date: 02/27/2020 Duration: 22.15 mins  Patient history: 65 year old woman with a past medical history significant for recent COVID-19 infection and hypertension presenting with renal failure currently of unclear etiology, complicated by seizure and loss of pulse s/p CPR with initial good return to baseline.  EEG to evaluate for seizures.  Level of alertness: Awake  AEDs during EEG study: Keppra  Technical aspects: This EEG study was done with scalp electrodes positioned according to the 10-20 International system of electrode placement. Electrical activity was acquired at a sampling rate of '500Hz'$  and reviewed with a high frequency filter of '70Hz'$  and a low frequency filter of '1Hz'$ . EEG data were recorded continuously and digitally stored.   Description: The posterior dominant rhythm consists of 9-10 Hz activity of moderate voltage (25-35 uV) seen predominantly in posterior head regions, symmetric and reactive to eye opening and eye closing. EEG showed intermittent generalized 5 to 6 Hz theta slowing as well as intermittent generalized rhythmic 2 to 3 Hz delta slowing. Hyperventilation and photic stimulation were not performed.   ABNORMALITY -Intermittent slow, generalized -intermittent rhythmic slow, generalized  IMPRESSION: This study is suggestive of mild to moderate diffuse encephalopathy, nonspecific etiology. No seizures or epileptiform discharges were seen throughout the recording.  Kohler Pellerito Barbra Sarks

## 2020-02-27 NOTE — Progress Notes (Signed)
NAME:  Barbara Crawford, MRN:  [id number], DOB:  03-09-1955, LOS: 2 ADMISSION DATE:  02/25/2020, CONSULTATION DATE:  02/26/20 REFERRING MD:  TRH, CHIEF COMPLAINT:  Seizure/Cardiac arrest   Brief History   Barbara Crawford is a 65 y.o. female with a pertinent PMH of HTN and recent COVID infection who presented to [facility] with a 1 week history of flank pain with lower extremity edema and admitted for acute renal failure complicated by seizure and cardiac arrest requiring intubation for airway protection.  Past Medical History  HTN  Significant Hospital Events   02/22: admitted 02/23: seizure/cardiac arrest 02/23: Intubatedand transferred to ICU 02/24: Subsequent seizure, loaded with keppra   Consults:  Nephro, Neuro  Procedures:  02/22: CVC placed 02/23: Intubated   Significant Diagnostic Tests:  US RENAL 02/25/2020 1. Bilaterally increased renal cortical echogenicity compatible with medical renal disease. 2. 9 mm nonobstructive calculus in the interpolar right kidney.   IR Fluoro Guide CV Line Right 02/26/2020  Right IJ temporary non tunneled hemodialysis catheter ready for use.   IR US Guide Vasc Access Right2/23/2022 I Right IJ temporary non tunneled hemodialysis catheter ready for use.  DG Chest Port 1 View 02/26/2020 1. New right IJ Vas-Cath with tip terminating in the right atrium. No pneumothorax or other acute complicating features. 2. Mild cardiomegaly.  CT Renal Stone Study 02/25/2020  1. No acute intra-abdominal process. 2. Nonobstructive right nephrolithiasis.   Micro Data:  COVID - negative BCx>>  Urine Cx>>  Antimicrobials:  none  Interim history/subjective:   Overnight, patient had another seizure requiring Keppra. CT head was WNL and neurology ordered an MRA, which is pending. She became HTN requiring IV medication, which is better controlled now.   On evaluation today. Patient is resting in ET tube in place. She is sedated with RASS of -4.    Objective   Blood pressure 122/86, pulse 96, temperature (!) 97.3 F (36.3 C), temperature source Oral, resp. rate (!) 0, height 5' 2.99" (1.6 m), weight 73.6 kg, SpO2 100 %.    Vent Mode: PRVC FiO2 (%):  [30 %] 30 % Set Rate:  [28 bmp] 28 bmp Vt Set:  [410 mL] 410 mL PEEP:  [5 cmH20] 5 cmH20 Plateau Pressure:  [14 cmH20-19 cmH20] 18 cmH20   Intake/Output Summary (Last 24 hours) at 02/27/2020 0600 Last data filed at 02/27/2020 0300 Gross per 24 hour  Intake 910.76 ml  Output 840 ml  Net 70.76 ml   Filed Weights   02/25/20 1802 02/26/20 1537 02/26/20 1835  Weight: 68 kg 70.4 kg 73.6 kg    Examination: General: acutely ill appearing female with ETT in place.  HENT: PERRL, mucous membranes are clear and moist Lungs: Clear to ausculation bilaterally,  pressures within normal limits Cardiovascular:  Rate and rhythm with no murmurs rubs or gallops Extremities:  Patient has 1-2+ pitting edema bilaterally in her lower extremities with increased swelling in her upper extremities.  Neuro:  Sedated. CN grossly intact.  GU: unremarkable.  Resolved Hospital Problem list     Assessment & Plan:   Normocytic Anemia: Hypoproliferation: -No overt signs of bleeding on exam today. -Etiology secondary to anemia of chronic kidney disease -Reticulocyte shows hypoproliferative state -PT/INR and PTT are within normal limits and no evidence of thrombocytopenia -S/p 2 units PRBCs with improvement of hemoglobin at 8.7 -We will receive iron with RRT  - Will continue to trend Hgb.  Acute Renal Failure: Uremia: AGMA: Protein Gap/Proteinuria: Secondary Hyperparathyroidism: -Patient status post  CRRT overnight. Her morning labs are pending - Significantly elevated protein/Cr ration at 24 and protein gap concerning for paraproteinemia/nephrotic syndrome.  - Work-up pending. Will need kidney biopsy.  - continue CRRT.  - consider starting DVT ppx with SQH due to intermediate bleeding risk and  albumin < 2.  - Appreciate nephrologies assistance.  - Strict ins and outs - avoid nephrotoxic medications   Uric acidemia: Hyperphosphatemia: Hypocalcemia: - Concern for TLS due to elevated potassium, uric acid and phosphorus and decrease calcium.  - S/p lokelma, CRRT, phosphate binders and calcium repletion.  - CT renal stone study does not show any over masses. CT head WNL. - CBC does not show any significant abnormalities other then anemia.  - Morning labs pending  Seizure: - Another seizure overnight. Loaded with Keppra.  - CT head WNL. Neuro ordered MRI brain and MRA. - Intubated for airway protection - VAP bundle  - PPI for GI protection - PAD protocol with RAS of -1 with precedex and fentanyl  - Continuous EEG  - Continue keppra and PRN ativan for seizures - Seizure precautions  - BC/UCx pending  Best Practice:  Diet: NPO Pain/Anxiety/Delirium protocol (if indicated): fentanyl and precedex VAP protocol (if indicated): indicated  DVT prophylaxis: SCD duye to acute anemia GI prophylaxis: PPI Glucose control: SSI Mobility: bed rest Code Status: Full Family Communication: spoke with daughter and son at bedside Disposition: ICU  Goals of Care:  Last date of multidisciplinary goals of care discussion: 02/23 Family and staff present: son and daughter Summary of discussion: full code Follow up goals of care discussion due: 02/28 Code Status: full  Labs   CBC: Recent Labs  Lab 02/25/20 1753 02/26/20 0305 02/26/20 1457 02/26/20 1701 02/26/20 2110  WBC 8.6 7.5 11.0*  --   --   NEUTROABS 7.0  --  7.4  --   --   HGB 6.3* 5.3* 6.4* 7.5* 8.7*  HCT 19.9* 15.5* 20.2* 22.0* 24.9*  MCV 86.1 82.9 85.6  --   --   PLT 282 221 317  --   --     Basic Metabolic Panel: Recent Labs  Lab 02/25/20 1753 02/25/20 1958 02/26/20 0305 02/26/20 1457 02/26/20 1529 02/26/20 1701  NA 130*  --  132* 134* 132* 131*  K 5.4*  --  5.1 4.4 4.5 4.8  CL 99  --  99 96* 97*  --    CO2 9*  --  11* 10* 10*  --   GLUCOSE 97  --  119* 150* 154*  --   BUN 144*  --  148* 143* 141*  --   CREATININE 29.98*  --  29.68* 30.13* 28.86*  --   CALCIUM 6.8*  --  6.3* 6.5* 6.1*  --   PHOS  --  >30.0* >30.0*  --  >30.0*  --    GFR: Estimated Creatinine Clearance: 1.9 mL/min (A) (by C-G formula based on SCr of 28.86 mg/dL (H)). Recent Labs  Lab 02/25/20 1753 02/26/20 0305 02/26/20 1457  WBC 8.6 7.5 11.0*    Liver Function Tests: Recent Labs  Lab 02/25/20 1753 02/26/20 0305 02/26/20 1457 02/26/20 1529  AST 26  --  46*  --   ALT 35  --  38  --   ALKPHOS 50  --  49  --   BILITOT 0.5  --  0.3  --   PROT 7.8  --  6.6  --   ALBUMIN 2.1* 1.6* 1.6* 1.6*   Recent Labs  Lab 02/25/20 1753  LIPASE 71*   No results for input(s): AMMONIA in the last 168 hours.  ABG    Component Value Date/Time   PHART 7.362 02/26/2020 1701   PCO2ART 25.6 (L) 02/26/2020 1701   PO2ART 465 (H) 02/26/2020 1701   HCO3 14.5 (L) 02/26/2020 1701   TCO2 15 (L) 02/26/2020 1701   ACIDBASEDEF 10.0 (H) 02/26/2020 1701   O2SAT 100.0 02/26/2020 1701     Coagulation Profile: Recent Labs  Lab 02/26/20 2110  INR 1.0    Cardiac Enzymes: No results for input(s): CKTOTAL, CKMB, CKMBINDEX, TROPONINI in the last 168 hours.  HbA1C: No results found for: HGBA1C  CBG: Recent Labs  Lab 02/26/20 1551 02/26/20 1805 02/26/20 1902 02/26/20 2301 02/27/20 0303  GLUCAP 135* 122* 96 74 94    Review of Systems:   ROS  Past Medical History  She,  has a past medical history of Hypertension.   Surgical History    Past Surgical History:  Procedure Laterality Date  . IR FLUORO GUIDE CV LINE RIGHT  02/26/2020  . IR US GUIDE VASC ACCESS RIGHT  02/26/2020     Social History   reports that she has never smoked. She does not have any smokeless tobacco history on file. She reports current alcohol use. She reports that she does not use drugs.   Family History   Her family history is not on file.    Allergies Allergies  Allergen Reactions  . Erythromycin Other (See Comments)    Pt does not recall  . Iodine Other (See Comments)    Arm flares up per pt  . Penicillins     Has patient had a PCN reaction causing immediate rash, facial/tongue/throat swelling, SOB or lightheadedness with hypotension: no Has patient had a PCN reaction causing severe rash involving mucus membranes or skin necrosis: yes Has patient had a PCN reaction that required hospitalization: no Has patient had a PCN reaction occurring within the last 10 years: no If all of the above answers are "NO", then may proceed with Cephalosporin use.  [employer]      Home Medications  Prior to Admission medications   Medication Sig Start Date End Date Taking? Authorizing Provider  acetaminophen (TYLENOL) 650 MG CR tablet Take 650 mg by mouth as needed for pain.   Yes [provider]  aspirin EC 81 MG tablet Take 81 mg by mouth daily.   Yes [provider]  HYDROcodone-acetaminophen (NORCO/VICODIN) 5-325 MG tablet Take 1 tablet by mouth 3 (three) times daily as needed for severe pain. 02/23/20  Yes [provider]  lisinopril (ZESTRIL) 5 MG tablet Take 5 mg by mouth daily. 02/24/20  Yes [provider]  NIFEdipine (ADALAT CC) 30 MG 24 hr tablet Take 30 mg by mouth at bedtime.   Yes [provider]  NIFEdipine (ADALAT CC) 90 MG 24 hr tablet Take 90 mg by mouth every morning. 01/11/16  Yes [provider]     Critical care time:      Lawerance Cruel, D.O.  Internal Medicine Resident, PGY-2 [facility]  Pager: [phone] 6:00 AM, 02/27/2020

## 2020-02-27 NOTE — Progress Notes (Signed)
[facility] Progress Note Patient Name: Barbara Crawford DOB: 21-Feb-1955 MRN: [id number]   Date of Service  02/27/2020  HPI/Events of Note  Patient is asking for a sleep aid.  eICU Interventions  Melatonin 3 mg Q HS PRN ordered.        Frederik Pear 02/27/2020, 9:53 PM

## 2020-02-27 NOTE — Progress Notes (Signed)
[facility] Progress Note Patient Name: Barbara Crawford DOB: Nov 28, 1955 MRN: [id number]   Date of Service  02/27/2020  HPI/Events of Note  Patient had a second seizure lasting for 4 minutes, tonic -clonic grand mal seizure, no apparent focal abnormality observed by the bedside RN.  eICU Interventions  Stat head CT, cEEG, Keppra load, Neurology consultation, seizure precautions.        Kerry Kass Jude Naclerio 02/27/2020, 3:44 AM

## 2020-02-27 NOTE — Therapy (Signed)
Pt transported to CT and MRI with no complications

## 2020-02-27 NOTE — Progress Notes (Signed)
Patient seen earlier this morning by my partner. Please see consult note from earlier this morning. Patient seen and examined. She is intubated, on minimal sedation with fentanyl, able to follow commands, appears weak on the left upper extremity but otherwise nonfocal with generalized weakness. No cranial nerve deficits or sensory deficits noted. Has multiple metabolic derangements. No seizure activity in the first few hours of this shift. Routine EEG from yesterday with mild to moderate diffuse encephalopathy-nonspecific etiology with no seizures or epileptiform discharges. Continue recommendations as in the consult note from earlier this morning. I do not see a need for LTM EEG at this time. If she were to seize again, please give me a call at that time. Please use Ativan for any seizure greater than 5 minutes. I suspect that her seizures are provoked in the setting of uremia as well as hypocalcemia.  -- Amie Portland, MD Neurologist [facility] Pager: [phone]

## 2020-02-27 NOTE — Progress Notes (Signed)
[facility] Progress Note  Name: Barbara Crawford MRN: [id number] DOB: 1955/05/23  Chief Complaint:  Back pain and lower extremity swelling  Subjective:  She had arrived to floor from ED on 2/23 and had seizure activity and arrested.  Code blue called; did achieve ROSC after two minutes of CPR per charting and she was intubated for airway protection and transferred to the ICU.  She was transfused.  Opted for CRRT rather than HD in light of arrest.  She had 0.8 mL uop over 2/23 and had 240 mL UF with CRRT.  AM labs still pending.  She had seizure activity again overnight and imaging was obtained - she was off CRRT from around 3-7 per nursing.  She was hypertensive and they report got hydralazine - now is 68/54.  Seen and examined on CRRT; procedure supervised.   Review of systems:   Unable to obtain 2/2 intubated and sedated     --------- Background on consult:   HPI: Barbara Crawford is an 65 y.o. female with HTN who is seen for evaluation and management of AKI.  She had COVID + in 01/2020 (unvaccinated), had 1 syncopal episode during [place], recovered at home and had some fatigue but was better enough to return to work and generally back to baseline.  Started having low back pain and decreased voiding for about month, worse for a week.  Urine character is normal per her but daughter thinks urine is cloudy and malodorous.   Using topical aspercreme and bengay for back with some help.  Poor appetite, more sleepy lately.  No emesis, no hiccups. +pruritis but thinks dry skin.  No confusion. Hydrocodone/APAP given urgent care yesterday; also takes nifedipine, lisinopril.  APAP only, no NSAIDs.  No h/o kidney stones.  Wt up and down lately but usually 150lbs.  LE edema x 1 week.  Back pain worse today and LE swelling worse so came into [facility] ED where labs showing Hb 6s, WBC 8.6, Plt 282, Na 130, K 5.4, bicarb 9, BUN 144, Cr 30, Ca 6.8, Alb 2.1, LFTs ok, Phos > 30.  CT renal stone  prot showing L 75m nonobstructing nephrolith, R kidney ok.  PCP referred her to nephrology - has outpt apt in 03/2020. Says abnormal kidney function and anemia but those labs aren't available.  She's not sure if urine was sent.  No fevers, chills.  No eye issues, no oral ulcers.  No bleeding.  Constipation lately.  Has a tooth infection x 1 year - couldn't afford extraction so it's been ongoing.  No family h/o ESRD or CKD.     Intake/Output Summary (Last 24 hours) at 02/27/2020 0714 Last data filed at 02/27/2020 0646 Gross per 24 hour  Intake 1240.66 ml  Output 1140 ml  Net 100.66 ml    Vitals:  Vitals:   02/27/20 0545 02/27/20 0600 02/27/20 0615 02/27/20 0630  BP:  (!) 157/92 (!) 151/86 (!) 149/84  Pulse: 63 61 61 (!) 58  Resp: (!) 25 (!) 28 (!) 28 (!) 28  Temp:      TempSrc:      SpO2: 100% 100% 100% 100%  Weight:      Height:         Physical Exam:    General adult female in bed critically ill  HEENT normocephalic atraumatic  Neck normal neck circumference trachea midline Lungs clear to auscultation on vent with FIO2 30 and PEEP 5 Heart S1S2 no rub Abdomen soft nontender  with limits of sedation nondistended Extremities trace if any lower extremity edema  Neuro - sedation currently running Access RIJ nontunneled catheter   Medications reviewed   Labs:  BMP Latest Ref Rng & Units 02/26/2020 02/26/2020 02/26/2020  Glucose 70 - 99 mg/dL - 154(H) 150(H)  BUN 8 - 23 mg/dL - 141(H) 143(H)  Creatinine 0.44 - 1.00 mg/dL - 28.86(H) 30.13(H)  Sodium 135 - 145 mmol/L 131(L) 132(L) 134(L)  Potassium 3.5 - 5.1 mmol/L 4.8 4.5 4.4  Chloride 98 - 111 mmol/L - 97(L) 96(L)  CO2 22 - 32 mmol/L - 10(L) 10(L)  Calcium 8.9 - 10.3 mg/dL - 6.1(LL) 6.5(L)     Assessment/Plan:   **AKI:   - 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  Appears was brewing in 12/2019 with PCP referring her to nephrology then.  Hep C neg, hep B s antigen neg.  nontunneled catheter on 02/25/20.  UA with > 300  mg/dl protein and 0-5 RBC. RPR nonreactive.  Started on CRRT on 2/24 after nontunneled catheter with IR.  ------------------ - await am labs - For now continue CRRT - reduce dialysate to 1.5 liters - Follow-up serologies and urine studies - ANCA, ANA pending, UPEP ordered - not sent, SPEP and free light chains pending.  Anti-GBM pending. ASO pending. Complement pending  - Requested outpatient records   - Will likely need a renal biopsy once stable for same  - Hold ACE inhibitor  - continue foley   ** Proteinuria - appears nephrotic range - up/cr ratio 24980 mg/g  ** Cardiac arrest - post-arrest care per pulmonary   **Hypotension - note hx of HTN.  Hold ACEi in setting of AKI and hypotension.  NS bolus 500 mL once now - not to be removed with CRRT.  If BP is refractory RN is contacting critical care  **Anemia  Severe - s/p PRBC's and thankfully Hb improved.  Await am labs.  Check myeloma labs as above.  Monitor for GI losses  **Seizure activity - neurology consulted.  Profound azotemia  **AGMA:  Presume secondary to AKI - for HD. On CRRT    **Hyperkalemia:  on CRRT and await labs  **Hyperphosphatemia:  Phos reported > 30; note uric acid is elevated as well at 9.  TLS still possible.  Stop renvela   **Hypocalcemia:  PTH 193.  Replete calcium 1 gram IV now.  Await am labs.  **Back pain:  Seems out of proportion to small stone; AKI generally not painful.  Seems MSK in origin.  W/u and treatment per primary.    Claudia Desanctis, MD 02/27/2020 7:40 AM

## 2020-02-27 NOTE — Progress Notes (Addendum)
[facility] Progress Note Patient Name: Ryleighann Didonna DOB: 1955/02/18 MRN: [id number]   Date of Service  02/27/2020  HPI/Events of Note  Patient was started on Procardia but it cannot be administered down an NG tube .  eICU Interventions  Procardia discontinued and Norvasc substituted. PRN iv Hydralazine ordered as well.        Kerry Kass Ogan 02/27/2020, 12:46 AM

## 2020-02-28 DIAGNOSIS — R569 Unspecified convulsions: Secondary | ICD-10-CM | POA: Diagnosis not present

## 2020-02-28 DIAGNOSIS — N179 Acute kidney failure, unspecified: Secondary | ICD-10-CM | POA: Diagnosis not present

## 2020-02-28 DIAGNOSIS — I10 Essential (primary) hypertension: Secondary | ICD-10-CM | POA: Diagnosis not present

## 2020-02-28 LAB — CBC
HCT: 24.7 % — ABNORMAL LOW (ref 36.0–46.0)
Hemoglobin: 8.1 g/dL — ABNORMAL LOW (ref 12.0–15.0)
MCH: 27.9 pg (ref 26.0–34.0)
MCHC: 32.8 g/dL (ref 30.0–36.0)
MCV: 85.2 fL (ref 80.0–100.0)
Platelets: 184 10*3/uL (ref 150–400)
RBC: 2.9 MIL/uL — ABNORMAL LOW (ref 3.87–5.11)
RDW: 16.2 % — ABNORMAL HIGH (ref 11.5–15.5)
WBC: 7.7 10*3/uL (ref 4.0–10.5)
nRBC: 0 % (ref 0.0–0.2)

## 2020-02-28 LAB — GLUCOSE, CAPILLARY
Glucose-Capillary: 113 mg/dL — ABNORMAL HIGH (ref 70–99)
Glucose-Capillary: 84 mg/dL (ref 70–99)
Glucose-Capillary: 72 mg/dL (ref 70–99)
Glucose-Capillary: 141 mg/dL — ABNORMAL HIGH (ref 70–99)
Glucose-Capillary: 109 mg/dL — ABNORMAL HIGH (ref 70–99)
Glucose-Capillary: 87 mg/dL (ref 70–99)

## 2020-02-28 LAB — RENAL FUNCTION PANEL
Albumin: 1.6 g/dL — ABNORMAL LOW (ref 3.5–5.0)
Albumin: 1.6 g/dL — ABNORMAL LOW (ref 3.5–5.0)
Anion gap: 12 (ref 5–15)
Anion gap: 10 (ref 5–15)
BUN: 36 mg/dL — ABNORMAL HIGH (ref 8–23)
BUN: 30 mg/dL — ABNORMAL HIGH (ref 8–23)
CO2: 24 mmol/L (ref 22–32)
CO2: 24 mmol/L (ref 22–32)
Calcium: 7.4 mg/dL — ABNORMAL LOW (ref 8.9–10.3)
Calcium: 7.2 mg/dL — ABNORMAL LOW (ref 8.9–10.3)
Chloride: 100 mmol/L (ref 98–111)
Chloride: 100 mmol/L (ref 98–111)
Creatinine, Ser: 6.62 mg/dL — ABNORMAL HIGH (ref 0.44–1.00)
Creatinine, Ser: 5.2 mg/dL — ABNORMAL HIGH (ref 0.44–1.00)
GFR, Estimated: 7 mL/min — ABNORMAL LOW (ref >60)
GFR, Estimated: 9 mL/min — ABNORMAL LOW (ref >60)
Glucose, Bld: 115 mg/dL — ABNORMAL HIGH (ref 70–99)
Glucose, Bld: 101 mg/dL — ABNORMAL HIGH (ref 70–99)
Phosphorus: 3.6 mg/dL (ref 2.5–4.6)
Phosphorus: 3.4 mg/dL (ref 2.5–4.6)
Potassium: 5 mmol/L (ref 3.5–5.1)
Potassium: 4.4 mmol/L (ref 3.5–5.1)
Sodium: 136 mmol/L (ref 135–145)
Sodium: 134 mmol/L — ABNORMAL LOW (ref 135–145)

## 2020-02-28 LAB — ANCA TITERS
Atypical P-ANCA titer: <1:20 {titer}
C-ANCA: <1:20 {titer}
P-ANCA: <1:20 {titer}

## 2020-02-28 LAB — PROTEIN ELECTROPHORESIS, SERUM
A/G Ratio: 0.5 — ABNORMAL LOW (ref 0.7–1.7)
Albumin ELP: 2 g/dL — ABNORMAL LOW (ref 2.9–4.4)
Alpha-1-Globulin: 0.3 g/dL (ref 0.0–0.4)
Alpha-2-Globulin: 1.2 g/dL — ABNORMAL HIGH (ref 0.4–1.0)
Beta Globulin: 0.9 g/dL (ref 0.7–1.3)
Gamma Globulin: 1.9 g/dL — ABNORMAL HIGH (ref 0.4–1.8)
Globulin, Total: 4.3 g/dL — ABNORMAL HIGH (ref 2.2–3.9)
Total Protein ELP: 6.3 g/dL (ref 6.0–8.5)

## 2020-02-28 LAB — MAGNESIUM: Magnesium: 2.3 mg/dL (ref 1.7–2.4)

## 2020-02-28 MED ORDER — LEVETIRACETAM 100 MG/ML PO SOLN: 1250.0000 mg | Freq: Two times a day (BID) | ORAL | Status: DC

## 2020-02-28 MED ORDER — DEXTROSE 50 % IV SOLN
INTRAVENOUS | Status: AC
  Filled 2020-02-28: qty 50

## 2020-02-28 MED ORDER — MELATONIN 3 MG PO TABS
3.0000 mg | ORAL_TABLET | Freq: Every evening | ORAL | Status: DC | PRN
  Administered 2020-03-01 – 2020-03-11 (×12): 3 mg via ORAL
  Filled 2020-02-28 (×13): qty 1

## 2020-02-28 MED ORDER — DOCUSATE SODIUM 100 MG PO CAPS
100.0000 mg | ORAL_CAPSULE | Freq: Two times a day (BID) | ORAL | Status: DC
  Administered 2020-02-28 – 2020-03-11 (×23): 100 mg via ORAL
  Filled 2020-02-28 (×24): qty 1

## 2020-02-28 MED ORDER — ENSURE ENLIVE PO LIQD
237.0000 mL | Freq: Three times a day (TID) | ORAL | Status: DC
  Administered 2020-02-28 – 2020-03-11 (×24): 237 mL via ORAL
  Filled 2020-02-28: qty 237

## 2020-02-28 MED ORDER — ORAL CARE MOUTH RINSE
15.0000 mL | Freq: Two times a day (BID) | OROMUCOSAL | Status: DC
  Administered 2020-02-28 – 2020-03-11 (×23): 15 mL via OROMUCOSAL

## 2020-02-28 MED ORDER — ACETAMINOPHEN 325 MG PO TABS
650.0000 mg | ORAL_TABLET | Freq: Four times a day (QID) | ORAL | Status: DC | PRN
  Administered 2020-02-28 – 2020-03-04 (×8): 650 mg via ORAL
  Filled 2020-02-28 (×8): qty 2

## 2020-02-28 MED ORDER — ONDANSETRON HCL 4 MG PO TABS: 4.0000 mg | ORAL_TABLET | Freq: Four times a day (QID) | ORAL | Status: DC | PRN

## 2020-02-28 MED ORDER — ONDANSETRON HCL 4 MG/2ML IJ SOLN: 4.0000 mg | Freq: Four times a day (QID) | INTRAMUSCULAR | Status: DC | PRN

## 2020-02-28 MED ORDER — PANTOPRAZOLE SODIUM 40 MG PO TBEC: 40.0000 mg | DELAYED_RELEASE_TABLET | Freq: Every day | ORAL | Status: DC

## 2020-02-28 MED ORDER — WHITE PETROLATUM EX OINT
TOPICAL_OINTMENT | CUTANEOUS | Status: AC
  Filled 2020-02-28: qty 28.35

## 2020-02-28 MED ORDER — LEVETIRACETAM 500 MG PO TABS: 500.0000 mg | ORAL_TABLET | Freq: Two times a day (BID) | ORAL | Status: DC

## 2020-02-28 MED ORDER — LEVETIRACETAM 750 MG PO TABS
1250.0000 mg | ORAL_TABLET | Freq: Two times a day (BID) | ORAL | Status: DC
  Administered 2020-02-28: 1250 mg via ORAL
  Filled 2020-02-28 (×2): qty 1

## 2020-02-28 MED ORDER — B COMPLEX-C PO TABS
1.0000 | ORAL_TABLET | Freq: Every day | ORAL | Status: DC
  Administered 2020-02-28 – 2020-03-04 (×6): 1 via ORAL
  Filled 2020-02-28 (×6): qty 1

## 2020-02-28 NOTE — Progress Notes (Signed)
[facility] Progress Note  Name: Barbara Crawford MRN: [id number] DOB: 04/14/1955  Chief Complaint:  Back pain and lower extremity swelling  Subjective:  Seen and examined on CRRT. Procedure supervised.  100/75 RIJ nontunn catheter.  She was anuric over 2/24.  Had 1.1 liters UF over 2/24 with CRRT.  Spoke with nursing.  They are hoping to extubate soon     Review of systems:    Unable to obtain 2/2 intubated and sedated     --------- Background on consult:   HPI: Barbara Crawford is an 65 y.o. female with HTN who is seen for evaluation and management of AKI.  She had COVID + in 01/2020 (unvaccinated), had 1 syncopal episode during [place], recovered at home and had some fatigue but was better enough to return to work and generally back to baseline.  Started having low back pain and decreased voiding for about month, worse for a week.  Urine character is normal per her but daughter thinks urine is cloudy and malodorous.   Using topical aspercreme and bengay for back with some help.  Poor appetite, more sleepy lately.  No emesis, no hiccups. +pruritis but thinks dry skin.  No confusion. Hydrocodone/APAP given urgent care yesterday; also takes nifedipine, lisinopril.  APAP only, no NSAIDs.  No h/o kidney stones.  Wt up and down lately but usually 150lbs.  LE edema x 1 week.  Back pain worse today and LE swelling worse so came into [facility] ED where labs showing Hb 6s, WBC 8.6, Plt 282, Na 130, K 5.4, bicarb 9, BUN 144, Cr 30, Ca 6.8, Alb 2.1, LFTs ok, Phos > 30.  CT renal stone prot showing L 82m nonobstructing nephrolith, R kidney ok.  PCP referred her to nephrology - has outpt apt in 03/2020. Says abnormal kidney function and anemia but those labs aren't available.  She's not sure if urine was sent.  No fevers, chills.  No eye issues, no oral ulcers.  No bleeding.  Constipation lately.  Has a tooth infection x 1 year - couldn't afford extraction so it's been ongoing.  No family h/o  ESRD or CKD.     Intake/Output Summary (Last 24 hours) at 02/28/2020 0651 Last data filed at 02/28/2020 0600 Gross per 24 hour  Intake 3848.17 ml  Output 1179 ml  Net 2669.17 ml    Vitals:  Vitals:   02/28/20 0500 02/28/20 0515 02/28/20 0600 02/28/20 0620  BP: '97/63 99/61 97/69 '$ 100/75  Pulse: (!) 56 (!) 55 (!) 54 64  Resp: (!) 28 (!) 28 (!) 28 (!) 22  Temp:      TempSrc:      SpO2: 100% 100% 98% 96%  Weight:      Height:         Physical Exam:  General adult female in bed critically ill  HEENT normocephalic atraumatic  Neck normal neck circumference trachea midline Lungs clear to auscultation on vent with FIO2 30 and PEEP 5 Heart S1S2 no rub Abdomen soft nontender with limits of sedation nondistended Extremities no lower extremity edema  Neuro - sedation currently running Access RIJ nontunneled catheter   Medications reviewed   Labs:  BMP Latest Ref Rng & Units 02/28/2020 02/27/2020 02/27/2020  Glucose 70 - 99 mg/dL 115(H) 110(H) 87  BUN 8 - 23 mg/dL 36(H) 53(H) 87(H)  Creatinine 0.44 - 1.00 mg/dL 6.62(H) 10.90(H) 17.40(H)  Sodium 135 - 145 mmol/L 136 136 135  Potassium 3.5 - 5.1 mmol/L 5.0  4.2 4.1  Chloride 98 - 111 mmol/L 100 102 101  CO2 22 - 32 mmol/L 24 22 18(L)  Calcium 8.9 - 10.3 mg/dL 7.4(L) 7.4(L) 6.6(L)     Assessment/Plan:   **AKI:   - 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  Appears was brewing in 12/2019 with PCP referring her to nephrology then.  Hep C neg, hep B s antigen neg.  nontunneled catheter on 02/25/20.  UA with > 300 mg/dl protein and 0-5 RBC. ANA positive.  Complement normal. RPR nonreactive. Anti-GBM neg. ASO normal. Free light chain ratio normal.  Started on CRRT on 2/24 after nontunneled catheter with IR.  ------------------ - Continue CRRT for now - hoping to extubate soon per RN.  Keep even - Follow-up serologies and urine studies - ANCA pending, UPEP ordered - not sent (was anuric), SPEP pending  - Requested outpatient records  - communication order resent today  - Will need a renal biopsy once stable for same  - Hold ACE inhibitor  - continue foley   ** Proteinuria - appears nephrotic range - up/cr ratio 24980 mg/g  ** Cardiac arrest - post-arrest care per pulmonary   **Hypotension - note hx of HTN.  Hold ACEi in setting of AKI and hypotension.  improved  **Anemia  Severe - s/p PRBC's and thankfully Hb improved. Check myeloma labs as above.  Monitor for GI losses. Ordered CBC  **Seizure activity - neurology consulted.  Profound azotemia and hypocalcemia  **AGMA:  felt secondary to AKI - for HD. On CRRT    **Hyperkalemia:  on CRRT   **Hyperphosphatemia:  Phos reported > 30; note uric acid was initially elevated as well at 9.  TLS still possible.  improved  **Hypocalcemia:  PTH 193. Improving with RRT and repletion  **Back pain:  Seems out of proportion to small stone; AKI generally not painful.  Seems MSK in origin.  W/u and treatment per primary.    Claudia Desanctis, MD 02/28/2020 7:09 AM

## 2020-02-28 NOTE — Progress Notes (Signed)
NAME:  Barbara Crawford, MRN:  [id number], DOB:  01-Jan-1956, LOS: 3 ADMISSION DATE:  02/25/2020, CONSULTATION DATE:  02/26/20 REFERRING MD:  TRH, CHIEF COMPLAINT:  Seizure/Cardiac arrest   Brief History   Barbara Crawford is a 65 y.o. female with a pertinent PMH of HTN and recent COVID infection who presented to [facility] with a 1 week history of flank pain with lower extremity edema and admitted for acute renal failure complicated by seizure and cardiac arrest requiring intubation for airway protection.  Past Medical History  HTN  Significant Hospital Events   02/22: admitted 02/23: seizure/cardiac arrest 02/23: Intubatedand transferred to ICU 02/24: Subsequent seizure, loaded with keppra  Consults:  Nephro, Neuro  Procedures:  02/22: CVC placed 02/23: Intubated  Significant Diagnostic Tests:  US RENAL 2/22 > Bilaterally increased renal cortical echogenicity compatible with medical renal disease. 2. 9 mm nonobstructive calculus in the interpolar right kidney.  CT Renal Stone Study 02/22 >  No acute intra-abdominal process. 2. Nonobstructive right nephrolithiasis. DG Chest Port 1 View 2/23 >  New right IJ Vas-Cath with tip terminating in the right atrium. No pneumothorax or other acute complicating features. 2. Mild cardiomegaly.  Micro Data:  COVID - negative BCx 2/23 >>  Urine Cx 2/23 > negative   Antimicrobials:  none  Interim history/subjective:  No seizures noted overnight. This AM on SBT.   Objective   Blood pressure 107/61, pulse 78, temperature (!) 97.2 F (36.2 C), temperature source Axillary, resp. rate (!) 23, height 5' 2.99" (1.6 m), weight 74 kg, SpO2 94 %.    Vent Mode: PRVC FiO2 (%):  [30 %-32 %] 30 % Set Rate:  [28 bmp] 28 bmp Vt Set:  [410 mL] 410 mL PEEP:  [5 cmH20] 5 cmH20 Plateau Pressure:  [20 cmH20-31 cmH20] 31 cmH20   Intake/Output Summary (Last 24 hours) at 02/28/2020 [id number] Last data filed at 02/28/2020 0800 Gross per 24 hour  Intake 3409.68 ml   Output 1294 ml  Net 2115.68 ml   Filed Weights   02/26/20 1835 02/27/20 0500 02/28/20 0332  Weight: 73.6 kg 74.5 kg 74 kg    Examination: General: adult female, lying in bed no distress  HENT: ETT/OG in place  Lungs: Clear breath sounds, no wheeze/crackles  Cardiovascular:  Tachy, no MRG Extremities:  generalized edema in all extremities  Neuro:  alert, follows commands, pupils intact and reactive  GU: foley in place   Resolved Hospital Problem list     Assessment & Plan:   Normocytic Anemia: Hypoproliferation: -Etiology secondary to anemia of chronic kidney disease -Reticulocyte shows hypoproliferative state -PT/INR and PTT are within normal limits -S/p 2 units PRBCs  Plan -Trend CBC -Transfuse for hemoglobin <7  Anion Gap Metabolic Acidosis, Acute Renal Failure: Uremia: Protein Gap/Proteinuria: Secondary Hyperparathyroidism: - Significantly elevated protein/Cr ration at 24 and protein gap concerning for paraproteinemia/nephrotic syndrome.  Plan - Nephrology Following  - Work-up pending. Will need kidney biopsy.  - continue CRRT.  - Strict ins and outs - avoid nephrotoxic medications  - Trend BMP. Replace electrolytes as indicated   Uric acidemia: Hyperphosphatemia: Hypocalcemia: - Concern for TLS due to elevated potassium, uric acid and phosphorus and decrease calcium.  - S/p lokelma, CRRT, phosphate binders and calcium repletion.  - CT renal stone study does not show any over masses. CT head WNL. Plan -Trend BMP. CA replaced 2/24   Seizure: - Another seizure overnight. Loaded with Keppra.  - CT head WNL. Neuro ordered MRI brain and  MRA. Plan - Neurology Following  - PAD protocol with RAS of -1 with precedex and fentanyl  - Continue keppra and PRN ativan for seizures - Seizure precautions  - BC/UCx pending (negative to date)   Respiratory Insuffiencey in setting of above requiring intubation for airway protection Plan - Vent Support - VAP  bundle  - Current on SBT. Goal to Extubate this AM.   Best Practice:  Diet: NPO Pain/Anxiety/Delirium protocol (if indicated): fentanyl and precedex VAP protocol (if indicated): indicated  DVT prophylaxis: Heparin sq GI prophylaxis: PPI Glucose control: SSI Mobility: bed rest Code Status: Full Family Communication: spoke with daughter and son at bedside Disposition: ICU  Goals of Care:  Last date of multidisciplinary goals of care discussion: 02/23 Family and staff present: son and daughter Summary of discussion: full code Follow up goals of care discussion due: 02/28 Code Status: full  Labs   CBC: Recent Labs  Lab 02/25/20 1753 02/26/20 0305 02/26/20 1457 02/26/20 1701 02/26/20 2110 02/27/20 0619  WBC 8.6 7.5 11.0*  --   --  6.4  NEUTROABS 7.0  --  7.4  --   --   --   HGB 6.3* 5.3* 6.4* 7.5* 8.7* 8.5*  HCT 19.9* 15.5* 20.2* 22.0* 24.9* 23.8*  MCV 86.1 82.9 85.6  --   --  81.0  PLT 282 221 317  --   --  [id number]    Basic Metabolic Panel: Recent Labs  Lab 02/26/20 0305 02/26/20 1457 02/26/20 1529 02/26/20 1701 02/27/20 0619 02/27/20 1600 02/28/20 0501  NA 132* 134* 132* 131* 135 136 136  K 5.1 4.4 4.5 4.8 4.1 4.2 5.0  CL 99 96* 97*  --  101 102 100  CO2 11* 10* 10*  --  18* 22 24  GLUCOSE 119* 150* 154*  --  87 110* 115*  BUN 148* 143* 141*  --  87* 53* 36*  CREATININE 29.68* 30.13* 28.86*  --  17.40* 10.90* 6.62*  CALCIUM 6.3*  6.2* 6.5* 6.1*  --  6.6* 7.4* 7.4*  MG  --   --   --   --  1.7  --  2.3  PHOS >30.0*  --  >30.0*  --  7.7* 5.0* 3.6   GFR: Estimated Creatinine Clearance: 8.4 mL/min (A) (by C-G formula based on SCr of 6.62 mg/dL (H)). Recent Labs  Lab 02/25/20 1753 02/26/20 0305 02/26/20 1457 02/27/20 0619  WBC 8.6 7.5 11.0* 6.4    Liver Function Tests: Recent Labs  Lab 02/25/20 1753 02/26/20 0305 02/26/20 1457 02/26/20 1529 02/27/20 0619 02/27/20 1600 02/28/20 0501  AST 26  --  46*  --   --   --   --   ALT 35  --  38  --   --    --   --   ALKPHOS 50  --  49  --   --   --   --   BILITOT 0.5  --  0.3  --   --   --   --   PROT 7.8  --  6.6  --   --   --   --   ALBUMIN 2.1*   < > 1.6* 1.6* 1.4* 1.7* 1.6*   < > = values in this interval not displayed.   Recent Labs  Lab 02/25/20 1753  LIPASE 71*   No results for input(s): AMMONIA in the last 168 hours.  ABG    Component Value Date/Time   PHART 7.362 02/26/2020 1701  PCO2ART 25.6 (L) 02/26/2020 1701   PO2ART 465 (H) 02/26/2020 1701   HCO3 14.5 (L) 02/26/2020 1701   TCO2 15 (L) 02/26/2020 1701   ACIDBASEDEF 10.0 (H) 02/26/2020 1701   O2SAT 100.0 02/26/2020 1701     Coagulation Profile: Recent Labs  Lab 02/26/20 2110  INR 1.0    Cardiac Enzymes: No results for input(s): CKTOTAL, CKMB, CKMBINDEX, TROPONINI in the last 168 hours.  HbA1C: No results found for: HGBA1C  CBG: Recent Labs  Lab 02/27/20 1605 02/27/20 1917 02/27/20 2347 02/28/20 0309 02/28/20 0804  GLUCAP 91 78 107* 113* 84    Review of Systems:   - denies nausea, denies SOB, denies pain  Past Medical History:  She,  has a past medical history of Hypertension.   Surgical History:   Past Surgical History:  Procedure Laterality Date  . IR FLUORO GUIDE CV LINE RIGHT  02/26/2020  . IR US GUIDE VASC ACCESS RIGHT  02/26/2020     Social History:   reports that she has never smoked. She does not have any smokeless tobacco history on file. She reports current alcohol use. She reports that she does not use drugs.   Family History:  Her family history is not on file.   Allergies Allergies  Allergen Reactions  . Erythromycin Other (See Comments)    Pt does not recall  . Iodine Other (See Comments)    Arm flares up per pt  . Penicillins     Has patient had a PCN reaction causing immediate rash, facial/tongue/throat swelling, SOB or lightheadedness with hypotension: no Has patient had a PCN reaction causing severe rash involving mucus membranes or skin necrosis: yes Has  patient had a PCN reaction that required hospitalization: no Has patient had a PCN reaction occurring within the last 10 years: no If all of the above answers are "NO", then may proceed with Cephalosporin use.  [employer]      Home Medications  Prior to Admission medications   Medication Sig Start Date End Date Taking? Authorizing Provider  acetaminophen (TYLENOL) 650 MG CR tablet Take 650 mg by mouth as needed for pain.   Yes [provider]  aspirin EC 81 MG tablet Take 81 mg by mouth daily.   Yes [provider]  HYDROcodone-acetaminophen (NORCO/VICODIN) 5-325 MG tablet Take 1 tablet by mouth 3 (three) times daily as needed for severe pain. 02/23/20  Yes [provider]  lisinopril (ZESTRIL) 5 MG tablet Take 5 mg by mouth daily. 02/24/20  Yes [provider]  NIFEdipine (ADALAT CC) 30 MG 24 hr tablet Take 30 mg by mouth at bedtime.   Yes [provider]  NIFEdipine (ADALAT CC) 90 MG 24 hr tablet Take 90 mg by mouth every morning. 01/11/16  Yes [provider]     Critical care time: 42 minutes     Hayden Pedro, AGACNP-BC [facility]  PCCM Pgr: [phone]

## 2020-02-28 NOTE — Progress Notes (Addendum)
Neurology Progress Note Subjective: No acute overnight events. No reported seizure activity per nursing. Patient anxious on examination this morning, began to attempt to self-extubate. ICU providers in patient room, extubated patient without complication. Patient awake and oriented in no acute distress following extubation.   Exam: Vitals:   02/28/20 0738 02/28/20 0856  BP:    Pulse: 78 (!) 115  Resp: (!) 23 19  Temp:    SpO2: 94% 96%   Gen: In bed, anxious mouthing "I do not want to die" around ETT; extubated with improvement in anxiety Resp: initially with ETT but extubated with strong, nonproductive cough, no respiratory distress noted post-extubation, patient on nasal cannula Abd: soft, non-distended  Neuro: Mental status: Awake, alert, and oriented to person, place, age, month, year, and situation. She was able to recall coming to the hospital for flank pain but was not aware of seizure activity. Comprehension, naming, and fluency intact. Speech without aphasia or dysarthria. Patient is hoarse and hypophonic following extubation.  CN: II: Visual fields full. Pupils are equal, round, and reactive to light.  2 mm/brisk bilaterally III,IV, VI: EOMI with symmetric and full lid elevation, no ptosis noted V: Facial sensation is symmetric to light touch VII: Face symmetric resting and smiling VIII: Hearing intact to voice IX/X: Phonation intact, hypophonic following extubation XI: Shoulder shrug symmetric XII: Tongue protrudes midline without fasciculations Motor: Tone is normal. Bulk is normal. 5/5 strength throughout with antigravity movement. No drift noted throughout.  Pain limited exam of the left upper extremity due to prior surgery. Deep Tendon Reflexes: 2+ and symmetric in the biceps and patellae Plantars:  Toes are downgoing bilaterally Cerebellar: FNF and HKS intact bilaterally Gait: deferred  Pertinent Labs: CBC    Component Value Date/Time   WBC 7.7 02/28/2020  0501   RBC 2.90 (L) 02/28/2020 0501   HGB 8.1 (L) 02/28/2020 0501   HCT 24.7 (L) 02/28/2020 0501   PLT 184 02/28/2020 0501   MCV 85.2 02/28/2020 0501   MCH 27.9 02/28/2020 0501   MCHC 32.8 02/28/2020 0501   RDW 16.2 (H) 02/28/2020 0501   LYMPHSABS 1.9 02/26/2020 1457   MONOABS 1.3 (H) 02/26/2020 1457   EOSABS 0.0 02/26/2020 1457   BASOSABS 0.1 02/26/2020 1457   CMP Latest Ref Rng & Units 02/28/2020 02/27/2020 02/27/2020  Glucose 70 - 99 mg/dL 115(H) 110(H) 87  BUN 8 - 23 mg/dL 36(H) 53(H) 87(H)  Creatinine 0.44 - 1.00 mg/dL 6.62(H) 10.90(H) 17.40(H)  Sodium 135 - 145 mmol/L 136 136 135  Potassium 3.5 - 5.1 mmol/L 5.0 4.2 4.1  Chloride 98 - 111 mmol/L 100 102 101  CO2 22 - 32 mmol/L 24 22 18(L)  Calcium 8.9 - 10.3 mg/dL 7.4(L) 7.4(L) 6.6(L)  Total Protein 6.5 - 8.1 g/dL - - -  Total Bilirubin 0.3 - 1.2 mg/dL - - -  Alkaline Phos 38 - 126 U/L - - -  AST 15 - 41 U/L - - -  ALT 0 - 44 U/L - - -  EEG-slowing MRI brain with bilaterally symmetric subtle hyperintensities reflective of systemic insult.  No stroke.  Impression: This is a 65 year old woman with a PMH significant for recent COVID-19 infection and HTN presenting with renal failure currently of unclear etiology, complicated by seizure and loss of pulse s/p CPR with initial good return to baseline. She had a second GTC seizure event lasting 4 minutes yesterday morning 2/24 in the ICU. Head CT is unrevealing, MRI imaging with symmetric patchy and indistinct cerebral white  matter diffusion restriction likely related to metabolic derangements. It is unclear if the activity is purely generalized versus with a focal onset given she reported feeling hot prior to seizure per nursing; the latter is more concerning for potential epilepsy.  - Most likely seizure in the setting of metabolic derangements (uremia and hypocalcemia), improvement of metabolic derangements Barbara Crawford and without further seizure activity reported - EEG 2/24 with mild to  moderate diffuse encephalopathy of nonspecific etiology with no seizures of epileptiform discharges - Creatinine improved from admission at 30 to 6.62 this morning, on CRRT - Extubated Barbara Crawford without complications; patient alert and oriented, follows all commands  Recommendations: - Keppra 500 BID - but since she is CRRT - 1250 BID- make sure to change to 500 BID on discharge. Pharmacy on board to assist dosing. -Decision to discontinue Keppra should be made after patient sees outpatient neurology 4 to 8 weeks after discharge.  At this point, would be hesitant to discontinue AEDs given concern for seizure and multiple seizure threshold lowering factors that are present. - Maintain inpatient seizure precautions - Ativan 2 mg PRN for seizure lasting longer than 5 minutes - Notify neurology of further seizure activity; currently do not recommend LTM with improvement in metabolic derangements and without further seizure activity. - Further management of medical comorbidities per critical care/nephrology - Neurology will be available for further recommendations on an as needed basis  Anibal Henderson, AGACNP-BC [facility] [phone]   Attending addendum Patient seen and examined.  I personally reviewed imaging and my impression is listed in the imaging section. Patient with recent COVID-19 infection, hypertension, presenting in AKI with BUN in 130s, creatinine 30.13, with concern for seizure. Also was hypocalcemic and had multiple other metabolic derangements. Likely provoked seizure but given that the description of the seizure was preceded by an aura, I would be hesitant to take her off of antiepileptics, while she also has lowered threshold due to metabolic derangements. Today, postextubation examination, she is able to follow all commands, hypophonic, is mildly encephalopathic but other than that has a rather benign neurological exam-has some left arm weakness due to pain which is  baseline. Recommendations as above.  I have made changes to the recommendations to reflect my assessment and plan. Discussed with Hayden Pedro, NP on the unit. Neurology will be available as needed  -- Amie Portland, MD Neurologist [facility] Pager: [phone]   [place] Performed by: Amie Portland, MD Total critical care time: 30 minutes Critical care time was exclusive of separately billable procedures and treating other patients and/or supervising APPs/Residents/Students Critical care was necessary to treat or prevent imminent or life-threatening deterioration due to concern for provoked seizure in the setting of multiple toxic metabolic derangements. This patient is critically ill and at significant risk for neurological worsening and/or death and care requires constant monitoring. Critical care was time spent personally by me on the following activities: development of treatment plan with patient and/or surrogate as well as nursing, discussions with consultants, evaluation of patient's response to treatment, examination of patient, obtaining history from patient or surrogate, ordering and performing treatments and interventions, ordering and review of laboratory studies, ordering and review of radiographic studies, pulse oximetry, re-evaluation of patient's condition, participation in multidisciplinary rounds and medical decision making of high complexity in the care of this patient.

## 2020-02-28 NOTE — Procedures (Signed)
Extubation Procedure Note  Patient Details:   Name: Barbara Crawford DOB: Jul 21, 1955 MRN: [id number]   Airway Documentation:    Vent end date: 02/28/20 Vent end time: 0857   Evaluation  O2 sats: stable throughout Complications: No apparent complications Patient did tolerate procedure well. Bilateral Breath Sounds: Clear,Diminished   Pt extubated to 4l [name] per MD order. Pt had positive cuff leak prior to extubation. No stridor noted. Pt able to voice her name.   [name] 02/28/2020, 8:57 AM

## 2020-02-28 NOTE — Progress Notes (Signed)
Nutrition Follow-up  DOCUMENTATION CODES:   Not applicable  INTERVENTION:   - Liberalize diet to Regular, verbal with readback order placed per CCM NP  - Ensure Enlive po TID, each supplement provides 350 kcal and 20 grams of protein  - B-complex with vitamin C to account for losses with CRRT  - Encourage adequate PO intake  NUTRITION DIAGNOSIS:   Increased nutrient needs related to acute illness (renal failure requiring CRRT) as evidenced by estimated needs.  Ongoing  GOAL:   Patient will meet greater than or equal to 90% of their needs  Progressing  MONITOR:   PO intake,Supplement acceptance,Labs,Weight trends,I & O's  REASON FOR ASSESSMENT:   Consult Enteral/tube feeding initiation and management  ASSESSMENT:   65 yo female admitted with acute renal failure complicated by seizure and cardiac arrest requiring intubation on 2/23. PMH includes HTN, recent COVID infection Dec 2021.  Discussed pt with RN and during ICU rounds. Pt extubated this morning and remains on CRRT. AKI with metabolic disturbance improving on CRRT.  Pt's diet advanced to renal and RN reporting pt really wants to eat. Discussed with CCM NP who agreed with liberalizing to regular given pt is on CRRT and potassium and phosphorus now WNL.  Spoke with pt at bedside. Pt eating ice at time of RD visit and reports that she is feeling very hungry. Pt willing to consume oral nutrition supplements to aid in meeting kcal and protein needs. Discussed need to eat well at meals due to increased nutrient needs related to CRRT. Pt expresses understanding.  Admit weight: 70.4 kg Current weight: 74 kg  Medications reviewed and include: colace, Ensure Enlive TID, SSI q 4 hours, miralax, precedex  Labs reviewed: BUN 36, creatinine 6.62, cholesterol 204, LDL 129, hemoglobin 8.1 CBG's: 72-113 x 24 hours  UOP: 50 ml x 24 hours CRRT UF: 1214 ml x 24 hours I/O's: +2.9 L since admit  Diet Order:   Diet Order             Diet regular Room service appropriate? Yes; Fluid consistency: Thin  Diet effective now                 EDUCATION NEEDS:   Education needs have been addressed  Skin:  Skin Assessment: Reviewed RN Assessment  Last BM:  02/28/20 large type 6  Height:   Ht Readings from Last 1 Encounters:  02/26/20 5' 2.99" (1.6 m)    Weight:   Wt Readings from Last 1 Encounters:  02/28/20 74 kg    Ideal Body Weight:  52.3 kg  BMI:  Body mass index is 28.91 kg/m.  Estimated Nutritional Needs:   Kcal:  2100-2300  Protein:  135-155 grams  Fluid:  >/= 2.0 L    Gustavus Bryant, MS, RD, LDN Inpatient Clinical Dietitian Please see AMiON for contact information.

## 2020-02-29 ENCOUNTER — Inpatient Hospital Stay (HOSPITAL_COMMUNITY): Payer: 59

## 2020-02-29 DIAGNOSIS — N179 Acute kidney failure, unspecified: Secondary | ICD-10-CM | POA: Diagnosis not present

## 2020-02-29 DIAGNOSIS — N001 Acute nephritic syndrome with focal and segmental glomerular lesions: Secondary | ICD-10-CM | POA: Diagnosis not present

## 2020-02-29 DIAGNOSIS — N19 Unspecified kidney failure: Secondary | ICD-10-CM | POA: Diagnosis not present

## 2020-02-29 DIAGNOSIS — I468 Cardiac arrest due to other underlying condition: Secondary | ICD-10-CM | POA: Diagnosis not present

## 2020-02-29 DIAGNOSIS — Z8616 Personal history of COVID-19: Secondary | ICD-10-CM | POA: Diagnosis not present

## 2020-02-29 DIAGNOSIS — G928 Other toxic encephalopathy: Secondary | ICD-10-CM | POA: Diagnosis not present

## 2020-02-29 DIAGNOSIS — I10 Essential (primary) hypertension: Secondary | ICD-10-CM | POA: Diagnosis not present

## 2020-02-29 LAB — RENAL FUNCTION PANEL
Albumin: 1.6 g/dL — ABNORMAL LOW (ref 3.5–5.0)
Anion gap: 11 (ref 5–15)
BUN: 35 mg/dL — ABNORMAL HIGH (ref 8–23)
CO2: 21 mmol/L — ABNORMAL LOW (ref 22–32)
Calcium: 7.2 mg/dL — ABNORMAL LOW (ref 8.9–10.3)
Chloride: 98 mmol/L (ref 98–111)
Creatinine, Ser: 6.17 mg/dL — ABNORMAL HIGH (ref 0.44–1.00)
GFR, Estimated: 7 mL/min — ABNORMAL LOW (ref >60)
Glucose, Bld: 93 mg/dL (ref 70–99)
Phosphorus: 3.1 mg/dL (ref 2.5–4.6)
Potassium: 4.6 mmol/L (ref 3.5–5.1)
Sodium: 130 mmol/L — ABNORMAL LOW (ref 135–145)

## 2020-02-29 LAB — URINALYSIS, ROUTINE W REFLEX MICROSCOPIC
Bacteria, UA: NONE SEEN
Bilirubin Urine: NEGATIVE
Glucose, UA: 50 mg/dL — AB
Ketones, ur: NEGATIVE mg/dL
Nitrite: NEGATIVE
Protein, ur: >=300 mg/dL — AB
Specific Gravity, Urine: 1.033 — ABNORMAL HIGH (ref 1.005–1.030)
WBC, UA: >50 WBC/hpf — ABNORMAL HIGH (ref 0–5)
pH: 7 (ref 5.0–8.0)

## 2020-02-29 LAB — CBC
HCT: 23.3 % — ABNORMAL LOW (ref 36.0–46.0)
Hemoglobin: 7.8 g/dL — ABNORMAL LOW (ref 12.0–15.0)
MCH: 28.8 pg (ref 26.0–34.0)
MCHC: 33.5 g/dL (ref 30.0–36.0)
MCV: 86 fL (ref 80.0–100.0)
Platelets: 176 10*3/uL (ref 150–400)
RBC: 2.71 MIL/uL — ABNORMAL LOW (ref 3.87–5.11)
RDW: 16.5 % — ABNORMAL HIGH (ref 11.5–15.5)
WBC: 14.6 10*3/uL — ABNORMAL HIGH (ref 4.0–10.5)
nRBC: 0.3 % — ABNORMAL HIGH (ref 0.0–0.2)

## 2020-02-29 LAB — GLUCOSE, CAPILLARY
Glucose-Capillary: 84 mg/dL (ref 70–99)
Glucose-Capillary: 97 mg/dL (ref 70–99)
Glucose-Capillary: 115 mg/dL — ABNORMAL HIGH (ref 70–99)

## 2020-02-29 LAB — MAGNESIUM: Magnesium: 2.1 mg/dL (ref 1.7–2.4)

## 2020-02-29 IMAGING — DX DG CHEST 1V PORT
1 series · 1 of 1 positions shown · non-contrast
Comparison: [DATE]

CLINICAL DATA: Fever and chills.

EXAM:
PORTABLE CHEST 1 VIEW

[chest]
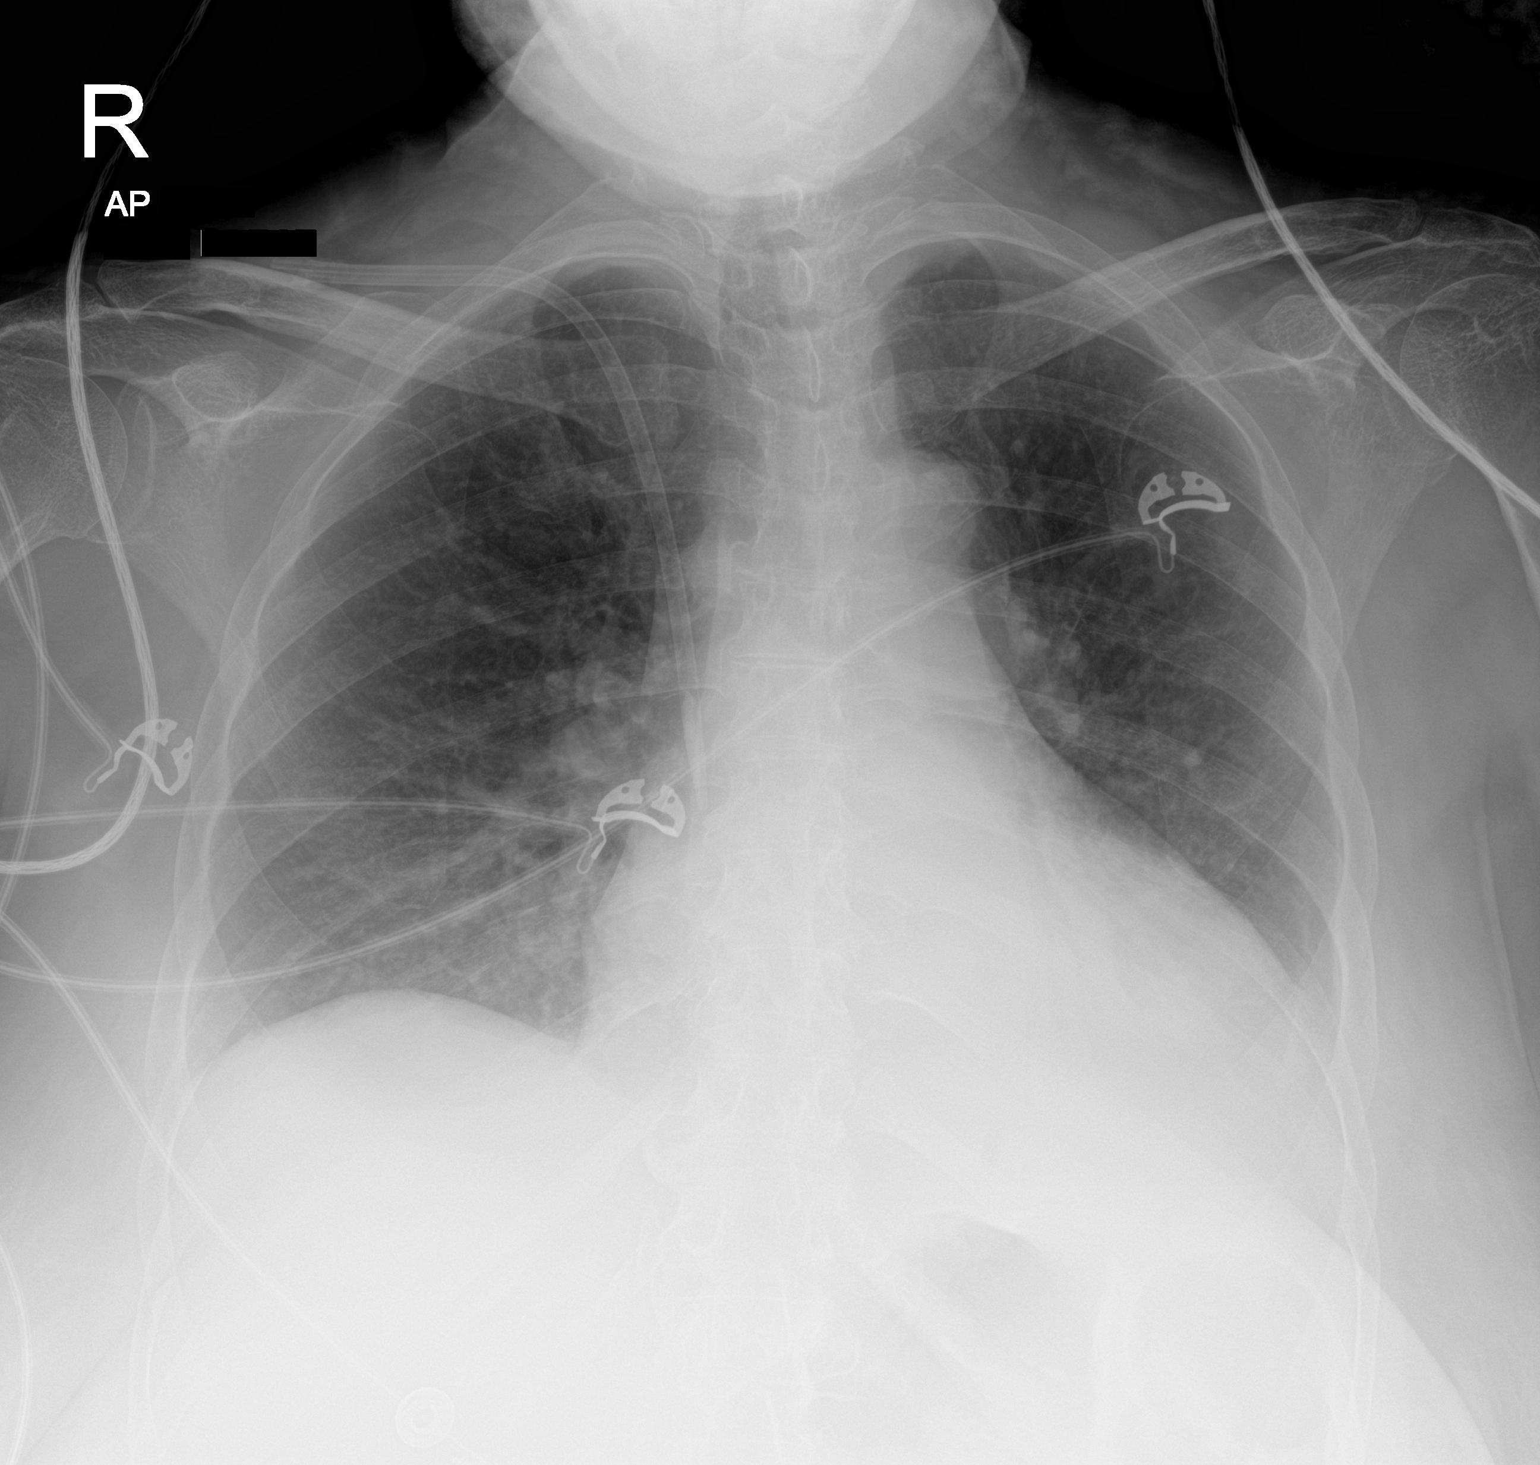

[1 of 1 positions shown; findings below may reference images not displayed]

FINDINGS: Right IJ central venous catheter has tip over the SVC and unchanged.
Lungs are somewhat hypoinflated. The left base/retrocardiac region
is difficult to evaluate due to prominent overlying soft tissues as
cannot exclude interspace process or small effusion in the left
base. Subtle hazy prominence of the perihilar vessels which may be
due to a degree of vascular congestion. Stable borderline
cardiomegaly. Remainder of the exam is unchanged.
IMPRESSION: 1. Suggestion of minimal vascular congestion. The left base is not
well evaluated on this exam as airspace process or a small effusion
is possible. Consider PA and lateral chest radiograph for better
evaluation of the left base.
2. Right IJ central venous catheter unchanged.

## 2020-02-29 MED ORDER — SODIUM CHLORIDE 0.9 % IV SOLN
2.0000 g | INTRAVENOUS | Status: DC
  Administered 2020-02-29 – 2020-03-04 (×5): 2 g via INTRAVENOUS
  Filled 2020-02-29 (×5): qty 20
  Filled 2020-02-29: qty 2

## 2020-02-29 MED ORDER — LEVETIRACETAM 250 MG PO TABS
250.0000 mg | ORAL_TABLET | ORAL | Status: DC
  Administered 2020-02-29: 250 mg via ORAL
  Filled 2020-02-29 (×6): qty 1

## 2020-02-29 MED ORDER — CHLORHEXIDINE GLUCONATE CLOTH 2 % EX PADS
6.0000 | MEDICATED_PAD | Freq: Every day | CUTANEOUS | Status: DC
  Administered 2020-03-01 – 2020-03-12 (×9): 6 via TOPICAL

## 2020-02-29 MED ORDER — HEPARIN SODIUM (PORCINE) 1000 UNIT/ML IJ SOLN
INTRAMUSCULAR | Status: AC
  Filled 2020-02-29: qty 3

## 2020-02-29 MED ORDER — LEVETIRACETAM 500 MG PO TABS
500.0000 mg | ORAL_TABLET | Freq: Two times a day (BID) | ORAL | Status: DC
  Administered 2020-02-29 – 2020-03-11 (×24): 500 mg via ORAL
  Filled 2020-02-29 (×25): qty 1

## 2020-02-29 NOTE — Progress Notes (Signed)
Pts daughter dropped off her purse w/ glasses and cell phone. RN gave purse to patient in HD.

## 2020-02-29 NOTE — Progress Notes (Signed)
[facility] Progress Note  Name: Barbara Crawford MRN: [id number] DOB: [birth year]  Chief Complaint:  Back pain and lower extremity swelling  Subjective:  She was anuric over 2/25.  Had 226 mL UF over 2/25 with CRRT.  CRRT stopped yesterday afternoon per nursing.  She was extubated yesterday AM.  We discussed risks/benefits/indications for renal biopsy and she does consent to the biopsy.  It's been emotional and overwhelming - she wants to figure out what's going on and we talked about possibility of lupus.   Review of systems:   Denies shortness of breath  Has had some chest discomfort post CPR Denies n/v   --------- Background on consult:   HPI: Barbara Crawford is an 65 y.o. female with HTN who is seen for evaluation and management of AKI.  She had COVID + in 01/2020 (unvaccinated), had 1 syncopal episode during [place], recovered at home and had some fatigue but was better enough to return to work and generally back to baseline.  Started having low back pain and decreased voiding for about month, worse for a week.  Urine character is normal per her but daughter thinks urine is cloudy and malodorous.   Using topical aspercreme and bengay for back with some help.  Poor appetite, more sleepy lately.  No emesis, no hiccups. +pruritis but thinks dry skin.  No confusion. Hydrocodone/APAP given urgent care yesterday; also takes nifedipine, lisinopril.  APAP only, no NSAIDs.  No h/o kidney stones.  Wt up and down lately but usually 150lbs.  LE edema x 1 week.  Back pain worse today and LE swelling worse so came into [facility] ED where labs showing Hb 6s, WBC 8.6, Plt 282, Na 130, K 5.4, bicarb 9, BUN 144, Cr 30, Ca 6.8, Alb 2.1, LFTs ok, Phos > 30.  CT renal stone prot showing L 52m nonobstructing nephrolith, R kidney ok.  PCP referred her to nephrology - has outpt apt in 03/2020. Says abnormal kidney function and anemia but those labs aren't available.  She's not sure if urine was  sent.  No fevers, chills.  No eye issues, no oral ulcers.  No bleeding.  Constipation lately.  Has a tooth infection x 1 year - couldn't afford extraction so it's been ongoing.  No family h/o ESRD or CKD.     Intake/Output Summary (Last 24 hours) at 02/29/2020 0707 Last data filed at 02/29/2020 0348 Gross per 24 hour  Intake 561.72 ml  Output 261 ml  Net 300.72 ml    Vitals:  Vitals:   02/29/20 0418 02/29/20 0500 02/29/20 0600 02/29/20 0700  BP:  (!) 147/75 (!) 148/79 131/81  Pulse:  (!) 108 (!) 104 (!) 105  Resp:  '20 20 19  '$ Temp:      TempSrc:      SpO2:  95% 97% 97%  Weight: 74 kg     Height:         Physical Exam:  General adult female in bed in NAC HEENT normocephalic atraumatic  Neck normal neck circumference trachea midline Lungs clear to auscultation bilaterally and unlabored at rest Heart S1S2 no rub Abdomen soft nontender with limits of sedation nondistended Extremities no lower extremity edema  Neuro - alert and oriented x 3 provides hx and follows commands psych normal mood and affect Access RIJ nontunneled catheter   Medications reviewed   Labs:  BMP Latest Ref Rng & Units 02/29/2020 02/28/2020 02/28/2020  Glucose 70 - 99 mg/dL 93 101(H) 115(H)  BUN 8 - 23 mg/dL 35(H) 30(H) 36(H)  Creatinine 0.44 - 1.00 mg/dL 6.17(H) 5.20(H) 6.62(H)  Sodium 135 - 145 mmol/L 130(L) 134(L) 136  Potassium 3.5 - 5.1 mmol/L 4.6 4.4 5.0  Chloride 98 - 111 mmol/L 98 100 100  CO2 22 - 32 mmol/L 21(L) 24 24  Calcium 8.9 - 10.3 mg/dL 7.2(L) 7.2(L) 7.4(L)     Assessment/Plan:   **AKI:   - 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  Appears was brewing in 12/2019 with PCP referring her to nephrology then.  Hep C neg, hep B s antigen neg.  nontunneled catheter on 02/25/20.  UA with > 300 mg/dl protein and 0-5 RBC. ANA positive.  ANCA negative.  Complement normal. RPR nonreactive. Anti-GBM neg. ASO normal. SPEP no M spike. Free light chain ratio normal.  Started on CRRT on 2/24  after nontunneled catheter with IR.  ------------------ - HD today - she can go to the HD unit.  Then assess needs daily  - UPEP ordered - not sent yet (anuric) - Requested outpatient records  - Plan for a renal biopsy on 2/28 consulted IR and will fill out biopsy form for Barbara Crawford - Hold ACE inhibitor  - continue foley   ** Proteinuria - nephrotic range - up/cr ratio 24980 mg/g May be 2/2 lupus and getting a biopsy   ** Cardiac arrest - post-arrest care per pulmonary   **Hypotension - note hx of HTN.  Hold ACEi in setting of AKI and hypotension.  improved  **Anemia  Severe - s/p PRBC's and thankfully Hb improved. myeloma labs as above.  Monitor for GI losses.   **Seizure activity - neurology consulted.  Metabolic derangements including azotemia and hypocalcemia  **AGMA:  felt secondary to AKI - on renal replacement therapy     **Hyperkalemia:  improved with CRRT   **Hyperphosphatemia:  Phos reported > 30 initially; note uric acid was initially elevated as well at 9.  Much improved.  Repeat uric acid in am to trend  **Hypocalcemia:  PTH 193. Improving with RRT and repletion  **Back pain:  Seems out of proportion to small stone; AKI generally not painful.  Seems MSK in origin.  W/u and treatment per primary.    Barbara Desanctis, MD 02/29/2020 7:42 AM

## 2020-02-29 NOTE — Progress Notes (Signed)
NAME:  Barbara Crawford, MRN:  [id number], DOB:  Mar 25, 1955, LOS: 4 ADMISSION DATE:  02/25/2020, CONSULTATION DATE:  02/26/20 REFERRING MD:  TRH, CHIEF COMPLAINT:  Seizure, cardiac arrest   Brief History:  65 y/o female who recently recovered from [place] presented to the ER on 2/22 in setting of weakness, poor appetite due to AKI.   Had a temporary HD catheter placed by IR and afterwards had a PEA arrest with associated seizure activity. Extubated on 2/25.  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 rate 19, height 5' 2.99" (1.6 m), weight 74 kg, SpO2 97 %.    Vent Mode: PRVC FiO2 (%):  [30 %] 30 % Set Rate:  [28 bmp] 28 bmp Vt Set:  [410 mL] 410 mL PEEP:  [5 cmH20] 5 cmH20 Plateau Pressure:  [31 cmH20] 31 cmH20   Intake/Output Summary (Last 24 hours) at 02/29/2020 0720 Last data filed at 02/29/2020 0348 Gross per 24 hour  Intake 561.72 ml  Output 261 ml  Net 300.72 ml   Filed Weights   02/27/20 0500 02/28/20 0332 02/29/20 0418  Weight: 74.5 kg 74 kg 74 kg    Examination:  General:  Resting comfortably in bed HENT: NCAT OP clear PULM: CTA B, normal effort CV: RRR, no mgr GI: BS+, soft, nontender MSK: normal bulk and tone Neuro: awake, alert, no distress,  MAEW  Resolved Hospital Problem list     Assessment & Plan:  Acute respiratory failure with hypoxemia due to cardiac arrest > resolved Monitor O2 saturation  AKI Hyperphosphatemia Uremia HD today  Renal biopsy planned Monitor BMET and UOP Replace electrolytes as needed  Seizure Keppra dose adjusted by neuro/pharmacy  Hypertension Monitor Prn labetalol  Normocytic anemia Monitor for bleeding Transfuse PRBC for Hgb < 7 gm/dL  New fever/cough/dysuria Ceftriaxone Urinalysis CXR  Best practice (evaluated daily)  Diet: renal diet Pain/Anxiety/Delirium protocol (if indicated): n/a VAP protocol (if indicated): n/a DVT prophylaxis: sub q hep GI prophylaxis: n/a Glucose control: moinitor Mobility: bed rest Disposition: move to TR[facility]  Goals of Care:  Last date of multidisciplinary goals of care discussion: 2/22 Family and staff present:  Summary of discussion:  Follow up goals of care discussion due: 2/29 Code Status: full  Labs   CBC: Recent Labs  Lab 02/25/20 1753 02/26/20 0305 02/26/20 1457 02/26/20 1701 02/26/20 2110 02/27/20 0619 02/28/20 0501 02/29/20 0352  WBC 8.6 7.5 11.0*  --   --  6.4 7.7 14.6*  NEUTROABS 7.0  --  7.4  --   --   --   --   --   HGB 6.3* 5.3* 6.4* 7.5* 8.7* 8.5* 8.1* 7.8*  HCT 19.9* 15.5* 20.2* 22.0* 24.9* 23.8* 24.7* 23.3*  MCV 86.1 82.9 85.6  --   --  81.0  85.2 86.0  PLT 282 221 317  --   --  192 184 0000000    Basic Metabolic Panel: Recent Labs  Lab 02/27/20 0619 02/27/20 1600 02/28/20 0501 02/28/20 1556 02/29/20 0352  NA 135 136 136 134* 130*  K 4.1 4.2 5.0 4.4 4.6  CL 101 102 100 100 98  CO2 18* '22 24 24 '$ 21*  GLUCOSE 87 110* 115* 101* 93  BUN 87* 53* 36* 30* 35*  CREATININE 17.40* 10.90* 6.62* 5.20* 6.17*  CALCIUM 6.6* 7.4* 7.4* 7.2* 7.2*  MG 1.7  --  2.3  --  2.1  PHOS 7.7* 5.0* 3.6 3.4 3.1   GFR: Estimated Creatinine Clearance: 9 mL/min (A) (by C-G formula based on SCr of 6.17 mg/dL (H)). Recent Labs  Lab  02/26/20 1457 02/27/20 0619 02/28/20 0501 02/29/20 0352  WBC 11.0* 6.4 7.7 14.6*    Liver Function Tests: Recent Labs  Lab 02/25/20 1753 02/26/20 0305 02/26/20 1457 02/26/20 1529 02/27/20 0619 02/27/20 1600 02/28/20 0501 02/28/20 1556 02/29/20 0352  AST 26  --  46*  --   --   --   --   --   --   ALT 35  --  38  --   --   --   --   --   --   ALKPHOS 50  --  49  --   --   --   --   --   --   BILITOT 0.5  --  0.3  --   --   --   --   --   --   PROT 7.8  --  6.6  --   --   --   --   --   --   ALBUMIN 2.1*   < > 1.6*   < > 1.4* 1.7* 1.6* 1.6* 1.6*   < > = values in this interval not displayed.   Recent Labs  Lab 02/25/20 1753  LIPASE 71*   No results for input(s): AMMONIA in the last 168 hours.  ABG    Component Value Date/Time   PHART 7.362 02/26/2020 1701   PCO2ART 25.6 (L) 02/26/2020 1701   PO2ART 465 (H) 02/26/2020 1701   HCO3 14.5 (L) 02/26/2020 1701   TCO2 15 (L) 02/26/2020 1701   ACIDBASEDEF 10.0 (H) 02/26/2020 1701   O2SAT 100.0 02/26/2020 1701     Coagulation Profile: Recent Labs  Lab 02/26/20 2110  INR 1.0    Cardiac Enzymes: No results for input(s): CKTOTAL, CKMB, CKMBINDEX, TROPONINI in the last 168 hours.  HbA1C: No results found for: HGBA1C  CBG: Recent Labs  Lab 02/28/20 1201 02/28/20 1503 02/28/20 1921 02/28/20 2303 02/29/20 0308  GLUCAP 72 141* 109* 87 84     Critical care time: n/a       Roselie Awkward, MD [facility] Pager: [phone] Cell: [phone] If no response, call [phone]

## 2020-02-29 NOTE — Consult Note (Signed)
Chief Complaint: Patient was seen in consultation today for AKI/random renal biopsy.  Referring Physician(s): Claudia Desanctis (neprhology)  Supervising Physician: Sandi Mariscal  Patient Status: [facility] - In-pt  History of Present Illness: Barbara Crawford is a 65 y.o. female with a past medical history of hypertension and COVID-19 infection 12/2019. She presented to [facility] ED 02/25/2020 with complaints of flank pain. In ED, patient found to have AKI. She was admitted for further management. Nephrology was consulted who recommended initiation of dialysis. She underwent a right IJ non-tunneled HD cathter placement in IR 02/26/2020. Hospital course complicated by PEA arrest with associated seizure activity following procedure- she was admitted and transferred to ICU, extubated 02/28/2020. During these events, patient also found to be ANA positive.  IR consulted by Dr. Royce Macadamia for possible image-guided random renal biopsy to evaluate cause of AKI. Patient awake and alert sitting in bed with no complaints at this time. Denies fever, chills, chest pain, dyspnea, abdominal pain, or headache.  LD SQ Heparin this AM at 0540.   Past Medical History:  Diagnosis Date  . Hypertension     Past Surgical History:  Procedure Laterality Date  . IR FLUORO GUIDE CV LINE RIGHT  02/26/2020  . IR US GUIDE VASC ACCESS RIGHT  02/26/2020    Allergies: Erythromycin, Iodine, Penicillins, and Shellfish-derived products  Medications: Prior to Admission medications   Medication Sig Start Date End Date Taking? Authorizing Provider  acetaminophen (TYLENOL) 650 MG CR tablet Take 650 mg by mouth as needed for pain.   Yes [provider]  aspirin EC 81 MG tablet Take 81 mg by mouth daily.   Yes [provider]  HYDROcodone-acetaminophen (NORCO/VICODIN) 5-325 MG tablet Take 1 tablet by mouth 3 (three) times daily as needed for severe pain. 02/23/20  Yes [provider]  lisinopril (ZESTRIL) 5  MG tablet Take 5 mg by mouth daily. 02/24/20  Yes [provider]  NIFEdipine (ADALAT CC) 30 MG 24 hr tablet Take 30 mg by mouth at bedtime.   Yes [provider]  NIFEdipine (ADALAT CC) 90 MG 24 hr tablet Take 90 mg by mouth every morning. 01/11/16  Yes [provider]     No family history on file.  Social History   Socioeconomic History  . Marital status: Single    Spouse name: Not on file  . Number of children: Not on file  . Years of education: Not on file  . Highest education level: Not on file  Occupational History  . Not on file  Tobacco Use  . Smoking status: Never Smoker  . Smokeless tobacco: Not on file  Substance and Sexual Activity  . Alcohol use: Yes  . Drug use: No  . Sexual activity: Not on file  Other Topics Concern  . Not on file  Social History Narrative  . Not on file   Social Determinants of Health   Financial Resource Strain: Not on file  Food Insecurity: Not on file  Transportation Needs: Not on file  Physical Activity: Not on file  Stress: Not on file  Social Connections: Not on file     Review of Systems: A 12 point ROS discussed and pertinent positives are indicated in the HPI above.  All other systems are negative.  Review of Systems  Constitutional: Negative for chills and fever.  Respiratory: Negative for shortness of breath and wheezing.   Cardiovascular: Negative for chest pain and palpitations.  Gastrointestinal: Negative for abdominal pain.  Neurological: Negative for headaches.  Psychiatric/Behavioral: Negative for behavioral problems and confusion.    Vital Signs: BP 131/81   Pulse (!) 105   Temp 98.4 F (36.9 C) (Oral)   Resp 19   Ht 5' 2.99" (1.6 m)   Wt 163 lb 2.3 oz (74 kg)   SpO2 97%   BMI 28.91 kg/m   Physical Exam Vitals and nursing note reviewed.  Constitutional:      General: She is not in acute distress. Cardiovascular:     Rate and Rhythm: Regular rhythm. Tachycardia present.      Heart sounds: Normal heart sounds. No murmur heard.   Pulmonary:     Effort: Pulmonary effort is normal. No respiratory distress.     Breath sounds: Normal breath sounds.  Skin:    General: Skin is warm and dry.  Neurological:     Mental Status: She is alert and oriented to person, place, and time.      MD Evaluation Airway: WNL Heart: WNL Abdomen: WNL Chest/ Lungs: WNL ASA  Classification: 3 Mallampati/Airway Score: Two   Imaging: DG Abd 1 View  Result Date: 02/26/2020 CLINICAL DATA:  OG tube placement EXAM: ABDOMEN - 1 VIEW COMPARISON:  CT 02/25/2020 FINDINGS: Esophageal tube tip and side port overlie the proximal to mid gastric region. Gas pattern is nonobstructed. Clips in the right upper quadrant IMPRESSION: Esophageal tube tip overlies the proximal to mid gastric region. Electronically Signed   By: Donavan Foil M.D.   On: 02/26/2020 21:10   CT HEAD WO CONTRAST  Result Date: 02/27/2020 CLINICAL DATA:  65 year old female status post seizure. Status post COVID-19 in December. Admitted with acute kidney failure. EXAM: CT HEAD WITHOUT CONTRAST TECHNIQUE: Contiguous axial images were obtained from the base of the skull through the vertex without intravenous contrast. COMPARISON:  None. FINDINGS: Brain: Mild dystrophic calcifications at the bilateral basal ganglia. Cerebral volume is within normal limits for age. No midline shift, ventriculomegaly, mass effect, evidence of mass lesion, intracranial hemorrhage or evidence of cortically based acute infarction. Minor subcortical white matter hypodensity in the anterior left frontal lobe. Elsewhere gray-white matter differentiation appears normal. Vascular: Mild Calcified atherosclerosis at the skull base. No suspicious intracranial vascular hyperdensity. There is a degree of generalized intracranial artery tortuosity. The right vertebral artery appears dominant. Skull: Negative. Sinuses/Orbits: Visualized paranasal sinuses and mastoids are  clear. Other: There is fluid layering in the nasopharynx. Unclear whether the patient is intubated on the scout view. No acute orbit or scalp soft tissue finding. IMPRESSION: 1. No acute intracranial abnormality. Intracranial artery tortuosity. Mild for age nonspecific white matter changes. 2. Fluid layering in the nasopharynx. Query if the patient is intubated. Electronically Signed   By: Genevie Ann M.D.   On: 02/27/2020 04:43   MR ANGIO HEAD WO CONTRAST  Result Date: 02/27/2020 CLINICAL DATA:  65 year old female status post seizure. Status post COVID-19 in December. Admitted with acute kidney failure. EXAM: MRI HEAD WITHOUT CONTRAST MRA HEAD WITHOUT CONTRAST TECHNIQUE: Multiplanar, multiecho pulse sequences of the brain and surrounding structures were obtained without intravenous contrast. Angiographic images of the head were obtained using MRA technique without contrast. COMPARISON:  Head CT 0428 hours today. FINDINGS: MRI HEAD FINDINGS Brain: Patchy and indistinct symmetric abnormal cerebral white matter signal on DWI which appears mildly restricted (series 5, image 85 and series 6, image 35). No corresponding white matter T2 or FLAIR hyperintensity, although there is scattered small subcortical white matter T2 and FLAIR signal which appears not  directly related. No other restricted diffusion. No midline shift, mass effect, evidence of mass lesion, ventriculomegaly, extra-axial collection or acute intracranial hemorrhage. Cervicomedullary junction and pituitary are within normal limits. No cortical encephalomalacia. No chronic cerebral blood products. Deep gray matter nuclei, brainstem and cerebellum appear normal. Thin slice coronal temporal lobe imaging. Hippocampal formations appear symmetric and within normal limits (series 22, image 11. Other mesial temporal lobe structures appear within normal limits. Vascular: Major intracranial vascular flow voids are preserved. Tortuous intracranial arteries, including  dominant right vertebral artery. Skull and upper cervical spine: Partially visible cervical spine degeneration, up to mild C4-C5 degenerative spinal stenosis. Visualized bone marrow signal is within normal limits. Sinuses/Orbits: Negative orbits. Paranasal Visualized paranasal sinuses and mastoids are stable and well pneumatized. Other: Intubated with small volume fluid in the nasopharynx. Visible internal auditory structures appear normal. Scalp soft tissues appear negative. MRA HEAD FINDINGS Antegrade flow in the posterior circulation with tortuous dominant right vertebral artery. Tortuous basilar artery. Normal PICA origins. No vertebrobasilar stenosis. Patent SCA and PCA origins. Posterior communicating arteries are present, the left is larger. Bilateral PCA branches are normal aside from tortuosity. Antegrade flow in both ICA siphons. No siphon stenosis. Ophthalmic and posterior communicating artery origins are within normal limits. Patent carotid termini. Dominant left ACA A1 segment. Anterior communicating artery is normal. Visible ACA branches are within normal limits. MCA M1 segments and bifurcations are tortuous but patent without stenosis. Visible MCA branches are normal aside from tortuosity. IMPRESSION: 1. Symmetric patchy and indistinct central cerebral white matter diffusion restriction, nonspecific. Perhaps this is sequelae of Uremia or other metabolic derangement. COVID-19 related demyelination is possible, although there is no corresponding T2/FLAIR abnormality. Sequelae of seizure activity was also considered but felt unlikely. 2. Otherwise largely unremarkable for age noncontrast MRI appearance of the brain, ordinary scattered subcortical white matter signal changes most commonly due to small vessel disease. 3. Intracranial MRA is negative aside from generalized arterial tortuosity. Electronically Signed   By: Genevie Ann M.D.   On: 02/27/2020 06:20   MR BRAIN WO CONTRAST  Result Date:  02/27/2020 CLINICAL DATA:  65 year old female status post seizure. Status post COVID-19 in December. Admitted with acute kidney failure. EXAM: MRI HEAD WITHOUT CONTRAST MRA HEAD WITHOUT CONTRAST TECHNIQUE: Multiplanar, multiecho pulse sequences of the brain and surrounding structures were obtained without intravenous contrast. Angiographic images of the head were obtained using MRA technique without contrast. COMPARISON:  Head CT 0428 hours today. FINDINGS: MRI HEAD FINDINGS Brain: Patchy and indistinct symmetric abnormal cerebral white matter signal on DWI which appears mildly restricted (series 5, image 85 and series 6, image 35). No corresponding white matter T2 or FLAIR hyperintensity, although there is scattered small subcortical white matter T2 and FLAIR signal which appears not directly related. No other restricted diffusion. No midline shift, mass effect, evidence of mass lesion, ventriculomegaly, extra-axial collection or acute intracranial hemorrhage. Cervicomedullary junction and pituitary are within normal limits. No cortical encephalomalacia. No chronic cerebral blood products. Deep gray matter nuclei, brainstem and cerebellum appear normal. Thin slice coronal temporal lobe imaging. Hippocampal formations appear symmetric and within normal limits (series 22, image 11. Other mesial temporal lobe structures appear within normal limits. Vascular: Major intracranial vascular flow voids are preserved. Tortuous intracranial arteries, including dominant right vertebral artery. Skull and upper cervical spine: Partially visible cervical spine degeneration, up to mild C4-C5 degenerative spinal stenosis. Visualized bone marrow signal is within normal limits. Sinuses/Orbits: Negative orbits. Paranasal Visualized paranasal sinuses and mastoids are stable  and well pneumatized. Other: Intubated with small volume fluid in the nasopharynx. Visible internal auditory structures appear normal. Scalp soft tissues appear  negative. MRA HEAD FINDINGS Antegrade flow in the posterior circulation with tortuous dominant right vertebral artery. Tortuous basilar artery. Normal PICA origins. No vertebrobasilar stenosis. Patent SCA and PCA origins. Posterior communicating arteries are present, the left is larger. Bilateral PCA branches are normal aside from tortuosity. Antegrade flow in both ICA siphons. No siphon stenosis. Ophthalmic and posterior communicating artery origins are within normal limits. Patent carotid termini. Dominant left ACA A1 segment. Anterior communicating artery is normal. Visible ACA branches are within normal limits. MCA M1 segments and bifurcations are tortuous but patent without stenosis. Visible MCA branches are normal aside from tortuosity. IMPRESSION: 1. Symmetric patchy and indistinct central cerebral white matter diffusion restriction, nonspecific. Perhaps this is sequelae of Uremia or other metabolic derangement. COVID-19 related demyelination is possible, although there is no corresponding T2/FLAIR abnormality. Sequelae of seizure activity was also considered but felt unlikely. 2. Otherwise largely unremarkable for age noncontrast MRI appearance of the brain, ordinary scattered subcortical white matter signal changes most commonly due to small vessel disease. 3. Intracranial MRA is negative aside from generalized arterial tortuosity. Electronically Signed   By: Genevie Ann M.D.   On: 02/27/2020 06:20   US RENAL  Result Date: 02/25/2020 CLINICAL DATA:  Acute kidney injury EXAM: RENAL / URINARY TRACT ULTRASOUND COMPLETE COMPARISON:  CT 02/25/2020 FINDINGS: Right Kidney: Renal measurements: 9.7 x 4.2 x 4.7 cm = volume: 99.6 mL. Diffusely increased renal cortical echogenicity. 9 mm shadowing calculus seen in the interpolar right kidney. Corresponds well to the finding on CT. No hydronephrosis or concerning renal mass. Left Kidney: Renal measurements: 11.1 x 5.9 x 4.0 cm = volume: 136 mL. Diffusely increased renal  cortical echogenicity. No concerning renal mass, shadowing calculus or hydronephrosis. Bladder: Appears normal for degree of bladder distention. Other: None. IMPRESSION: 1. Bilaterally increased renal cortical echogenicity compatible with medical renal disease. 2. 9 mm nonobstructive calculus in the interpolar right kidney. Electronically Signed   By: Lovena Le M.D.   On: 02/25/2020 23:18   IR Fluoro Guide CV Line Right  Result Date: 02/26/2020 INDICATION: Acute kidney injury EXAM: Non tunneled temporary hemodialysis catheter placement MEDICATIONS: None ANESTHESIA/SEDATION: None FLUOROSCOPY TIME:  Fluoroscopy Time: 0 minutes 24 seconds (1 mGy). COMPLICATIONS: None immediate. PROCEDURE: Informed written consent was obtained from the patient after a thorough discussion of the procedural risks, benefits and alternatives. All questions were addressed. Maximal Sterile Barrier Technique was utilized including caps, mask, sterile gowns, sterile gloves, sterile drape, hand hygiene and skin antiseptic. A timeout was performed prior to the initiation of the procedure. Right neck prepped and draped in the usual sterile fashion. All elements of maximal sterile barrier were utilized including, cap, mask, sterile gown, sterile gloves, large sterile drape, hand scrubbing and 2% Chlorhexidine for skin cleaning. The right internal jugular vein was evaluated with ultrasound and shown to be patent. A permanent ultrasound image was obtained and placed in the patient's medical record. Using sterile gel and a sterile probe cover, the right internal jugular vein was entered with a 21 ga needle during real time ultrasound guidance. 0.018 inch guidewire placed and 21 ga needle exchanged for transitional dilator set. Utilizing fluoroscopy, 0.035 inch guidewire advanced through the needle without difficulty. Serial dilation performed, and catheter inserted over the guidewire. The tip was positioned in the right atrium. All lumens of  the catheter aspirated and flushed well.  The dialysis lumens were locked with Heparin. The catheter was secured to the skin with suture. The insertion site was covered with a Biopatch and sterile dressing. IMPRESSION: Right IJ temporary non tunneled hemodialysis catheter ready for use. Electronically Signed   By: Miachel Roux M.D.   On: 02/26/2020 13:50   IR US Guide Vasc Access Right  Result Date: 02/26/2020 INDICATION: Acute kidney injury EXAM: Non tunneled temporary hemodialysis catheter placement MEDICATIONS: None ANESTHESIA/SEDATION: None FLUOROSCOPY TIME:  Fluoroscopy Time: 0 minutes 24 seconds (1 mGy). COMPLICATIONS: None immediate. PROCEDURE: Informed written consent was obtained from the patient after a thorough discussion of the procedural risks, benefits and alternatives. All questions were addressed. Maximal Sterile Barrier Technique was utilized including caps, mask, sterile gowns, sterile gloves, sterile drape, hand hygiene and skin antiseptic. A timeout was performed prior to the initiation of the procedure. Right neck prepped and draped in the usual sterile fashion. All elements of maximal sterile barrier were utilized including, cap, mask, sterile gown, sterile gloves, large sterile drape, hand scrubbing and 2% Chlorhexidine for skin cleaning. The right internal jugular vein was evaluated with ultrasound and shown to be patent. A permanent ultrasound image was obtained and placed in the patient's medical record. Using sterile gel and a sterile probe cover, the right internal jugular vein was entered with a 21 ga needle during real time ultrasound guidance. 0.018 inch guidewire placed and 21 ga needle exchanged for transitional dilator set. Utilizing fluoroscopy, 0.035 inch guidewire advanced through the needle without difficulty. Serial dilation performed, and catheter inserted over the guidewire. The tip was positioned in the right atrium. All lumens of the catheter aspirated and flushed well.  The dialysis lumens were locked with Heparin. The catheter was secured to the skin with suture. The insertion site was covered with a Biopatch and sterile dressing. IMPRESSION: Right IJ temporary non tunneled hemodialysis catheter ready for use. Electronically Signed   By: Miachel Roux M.D.   On: 02/26/2020 13:50   DG CHEST PORT 1 VIEW  Result Date: 02/26/2020 CLINICAL DATA:  Ventilator dependent. EXAM: PORTABLE CHEST 1 VIEW COMPARISON:  Same day. FINDINGS: Stable cardiomediastinal silhouette. Endotracheal tube is in grossly good position. No pneumothorax or pleural effusion is noted. Both lungs are clear. The visualized skeletal structures are unremarkable. IMPRESSION: No active disease. Electronically Signed   By: Marijo Conception M.D.   On: 02/26/2020 15:58   DG Chest Port 1 View  Result Date: 02/26/2020 CLINICAL DATA:  65 year old female with history of hemodialysis catheter placement. Renal failure. EXAM: PORTABLE CHEST 1 VIEW COMPARISON:  Chest x-ray 01/28/2016. FINDINGS: New right IJ Vas-Cath with tip terminating in the right atrium. Lung volumes are low. No consolidative airspace disease. No pleural effusions. No pneumothorax. No pulmonary nodule or mass noted. Pulmonary vasculature is normal. Heart size is mildly enlarged. Upper mediastinal contours are within normal limits. IMPRESSION: 1. New right IJ Vas-Cath with tip terminating in the right atrium. No pneumothorax or other acute complicating features. 2. Mild cardiomegaly. Electronically Signed   By: Vinnie Langton M.D.   On: 02/26/2020 12:06   EEG adult  Result Date: 02/27/2020 Lora Havens, MD     02/27/2020  8:41 AM Patient Name: Roderick Mayeux MRN: [id number] Epilepsy Attending: Lora Havens Referring Physician/Provider: Dr Marianna Payment Date: 02/27/2020 Duration: 22.15 mins Patient history: 65 year old woman with a past medical history significant for recent COVID-19 infection and hypertension presenting with renal failure  currently of unclear etiology, complicated by seizure  and loss of pulse s/p CPR with initial good return to baseline.  EEG to evaluate for seizures. Level of alertness: Awake AEDs during EEG study: Keppra Technical aspects: This EEG study was done with scalp electrodes positioned according to the 10-20 International system of electrode placement. Electrical activity was acquired at a sampling rate of '500Hz'$  and reviewed with a high frequency filter of '70Hz'$  and a low frequency filter of '1Hz'$ . EEG data were recorded continuously and digitally stored. Description: The posterior dominant rhythm consists of 9-10 Hz activity of moderate voltage (25-35 uV) seen predominantly in posterior head regions, symmetric and reactive to eye opening and eye closing. EEG showed intermittent generalized 5 to 6 Hz theta slowing as well as intermittent generalized rhythmic 2 to 3 Hz delta slowing. Hyperventilation and photic stimulation were not performed. ABNORMALITY -Intermittent slow, generalized -intermittent rhythmic slow, generalized IMPRESSION: This study is suggestive of mild to moderate diffuse encephalopathy, nonspecific etiology. No seizures or epileptiform discharges were seen throughout the recording. Lora Havens   CT Renal Stone Study  Result Date: 02/25/2020 CLINICAL DATA:  Flank pain for the past week. EXAM: CT ABDOMEN AND PELVIS WITHOUT CONTRAST TECHNIQUE: Multidetector CT imaging of the abdomen and pelvis was performed following the standard protocol without IV contrast. COMPARISON:  None. FINDINGS: Lower chest: No acute abnormality. Hepatobiliary: No focal liver abnormality is seen. Status post cholecystectomy. No biliary dilatation. Pancreas: Unremarkable. No pancreatic ductal dilatation or surrounding inflammatory changes. Spleen: Normal in size without focal abnormality. Adrenals/Urinary Tract: Adrenal glands and left kidney are unremarkable. 6 mm nonobstructive right renal calculus. No hydronephrosis. The  bladder is unremarkable for the degree of distention. Stomach/Bowel: Stomach is within normal limits. Diminutive or absent appendix. No evidence of bowel wall thickening, distention, or inflammatory changes. Vascular/Lymphatic: No significant vascular findings are present. No enlarged abdominal or pelvic lymph nodes. Reproductive: Small calcified uterine fibroids.  No adnexal mass. Other: No abdominal wall hernia or abnormality. No abdominopelvic ascites. No pneumoperitoneum. Musculoskeletal: No acute or significant osseous findings. IMPRESSION: 1. No acute intra-abdominal process. 2. Nonobstructive right nephrolithiasis. Electronically Signed   By: Titus Dubin M.D.   On: 02/25/2020 19:39    Labs:  CBC: Recent Labs    02/26/20 1457 02/26/20 1701 02/26/20 2110 02/27/20 0619 02/28/20 0501 02/29/20 0352  WBC 11.0*  --   --  6.4 7.7 14.6*  HGB 6.4*   < > 8.7* 8.5* 8.1* 7.8*  HCT 20.2*   < > 24.9* 23.8* 24.7* 23.3*  PLT 317  --   --  192 184 176   < > = values in this interval not displayed.    COAGS: Recent Labs    02/26/20 2110  INR 1.0  APTT 29    BMP: Recent Labs    02/27/20 1600 02/28/20 0501 02/28/20 1556 02/29/20 0352  NA 136 136 134* 130*  K 4.2 5.0 4.4 4.6  CL 102 100 100 98  CO2 '22 24 24 '$ 21*  GLUCOSE 110* 115* 101* 93  BUN 53* 36* 30* 35*  CALCIUM 7.4* 7.4* 7.2* 7.2*  CREATININE 10.90* 6.62* 5.20* 6.17*  GFRNONAA 4* 7* 9* 7*    LIVER FUNCTION TESTS: Recent Labs    02/25/20 1753 02/26/20 0305 02/26/20 1457 02/26/20 1529 02/27/20 1600 02/28/20 0501 02/28/20 1556 02/29/20 0352  BILITOT 0.5  --  0.3  --   --   --   --   --   AST 26  --  46*  --   --   --   --   --  ALT 35  --  38  --   --   --   --   --   ALKPHOS 50  --  49  --   --   --   --   --   PROT 7.8  --  6.6  --   --   --   --   --   ALBUMIN 2.1*   < > 1.6*   < > 1.7* 1.6* 1.6* 1.6*   < > = values in this interval not displayed.     Assessment and Plan:  AKI without known  cause. Plan for image-guided random renal biopsy in IR tentatively for Monday 03/02/2020 pending IR scheduling. Patient will be NPO at midnight prior to procedure. Afebrile. Will hold SQ Heparin per IR protocol. INR 1.0 02/26/2020.  Risks and benefits discussed with the patient including, but not limited to bleeding, infection, damage to adjacent structures or low yield requiring additional tests. All of the patient's questions were answered, patient is agreeable to proceed. Consent signed and in IR control room.   Thank you for this interesting consult.  I greatly enjoyed meeting Lakeidra Schacht and look forward to participating in their care.  A copy of this report was sent to the requesting provider on this date.  Electronically Signed: Earley Abide, PA-C 02/29/2020, 9:30 AM   I spent a total of 20 Minutes in face to face in clinical consultation, greater than 50% of which was counseling/coordinating care for AKI/random renal biopsy.

## 2020-03-01 ENCOUNTER — Inpatient Hospital Stay (HOSPITAL_COMMUNITY): Payer: 59

## 2020-03-01 DIAGNOSIS — N179 Acute kidney failure, unspecified: Secondary | ICD-10-CM | POA: Diagnosis not present

## 2020-03-01 DIAGNOSIS — R509 Fever, unspecified: Secondary | ICD-10-CM | POA: Diagnosis not present

## 2020-03-01 DIAGNOSIS — I10 Essential (primary) hypertension: Secondary | ICD-10-CM | POA: Diagnosis not present

## 2020-03-01 LAB — COMPREHENSIVE METABOLIC PANEL
ALT: 54 U/L — ABNORMAL HIGH (ref 0–44)
AST: 103 U/L — ABNORMAL HIGH (ref 15–41)
Albumin: 1.4 g/dL — ABNORMAL LOW (ref 3.5–5.0)
Alkaline Phosphatase: 60 U/L (ref 38–126)
Anion gap: 12 (ref 5–15)
BUN: 18 mg/dL (ref 8–23)
CO2: 26 mmol/L (ref 22–32)
Calcium: 8.4 mg/dL — ABNORMAL LOW (ref 8.9–10.3)
Chloride: 97 mmol/L — ABNORMAL LOW (ref 98–111)
Creatinine, Ser: 4.31 mg/dL — ABNORMAL HIGH (ref 0.44–1.00)
GFR, Estimated: 11 mL/min — ABNORMAL LOW (ref >60)
Glucose, Bld: 101 mg/dL — ABNORMAL HIGH (ref 70–99)
Potassium: 4 mmol/L (ref 3.5–5.1)
Sodium: 135 mmol/L (ref 135–145)
Total Bilirubin: 0.3 mg/dL (ref 0.3–1.2)
Total Protein: 5.8 g/dL — ABNORMAL LOW (ref 6.5–8.1)

## 2020-03-01 LAB — CBC WITH DIFFERENTIAL/PLATELET
Abs Immature Granulocytes: 0.13 10*3/uL — ABNORMAL HIGH (ref 0.00–0.07)
Basophils Absolute: 0.1 10*3/uL (ref 0.0–0.1)
Basophils Relative: 0 %
Eosinophils Absolute: 0 10*3/uL (ref 0.0–0.5)
Eosinophils Relative: 0 %
HCT: 23.2 % — ABNORMAL LOW (ref 36.0–46.0)
Hemoglobin: 7.8 g/dL — ABNORMAL LOW (ref 12.0–15.0)
Immature Granulocytes: 1 %
Lymphocytes Relative: 5 %
Lymphs Abs: 0.8 10*3/uL (ref 0.7–4.0)
MCH: 29 pg (ref 26.0–34.0)
MCHC: 33.6 g/dL (ref 30.0–36.0)
MCV: 86.2 fL (ref 80.0–100.0)
Monocytes Absolute: 1.2 10*3/uL — ABNORMAL HIGH (ref 0.1–1.0)
Monocytes Relative: 8 %
Neutro Abs: 13.1 10*3/uL — ABNORMAL HIGH (ref 1.7–7.7)
Neutrophils Relative %: 86 %
Platelets: 176 10*3/uL (ref 150–400)
RBC: 2.69 MIL/uL — ABNORMAL LOW (ref 3.87–5.11)
RDW: 16 % — ABNORMAL HIGH (ref 11.5–15.5)
WBC: 15.3 10*3/uL — ABNORMAL HIGH (ref 4.0–10.5)
nRBC: 0.1 % (ref 0.0–0.2)

## 2020-03-01 LAB — URIC ACID: Uric Acid, Serum: 1.9 mg/dL — ABNORMAL LOW (ref 2.5–7.1)

## 2020-03-01 LAB — PHOSPHORUS: Phosphorus: 3.7 mg/dL (ref 2.5–4.6)

## 2020-03-01 IMAGING — DX DG CHEST 2V
2 series · 2 of 2 positions shown · non-contrast
Comparison: [DATE]

CLINICAL DATA: Pneumonia.

EXAM:
CHEST - 2 VIEW

[chest pa]
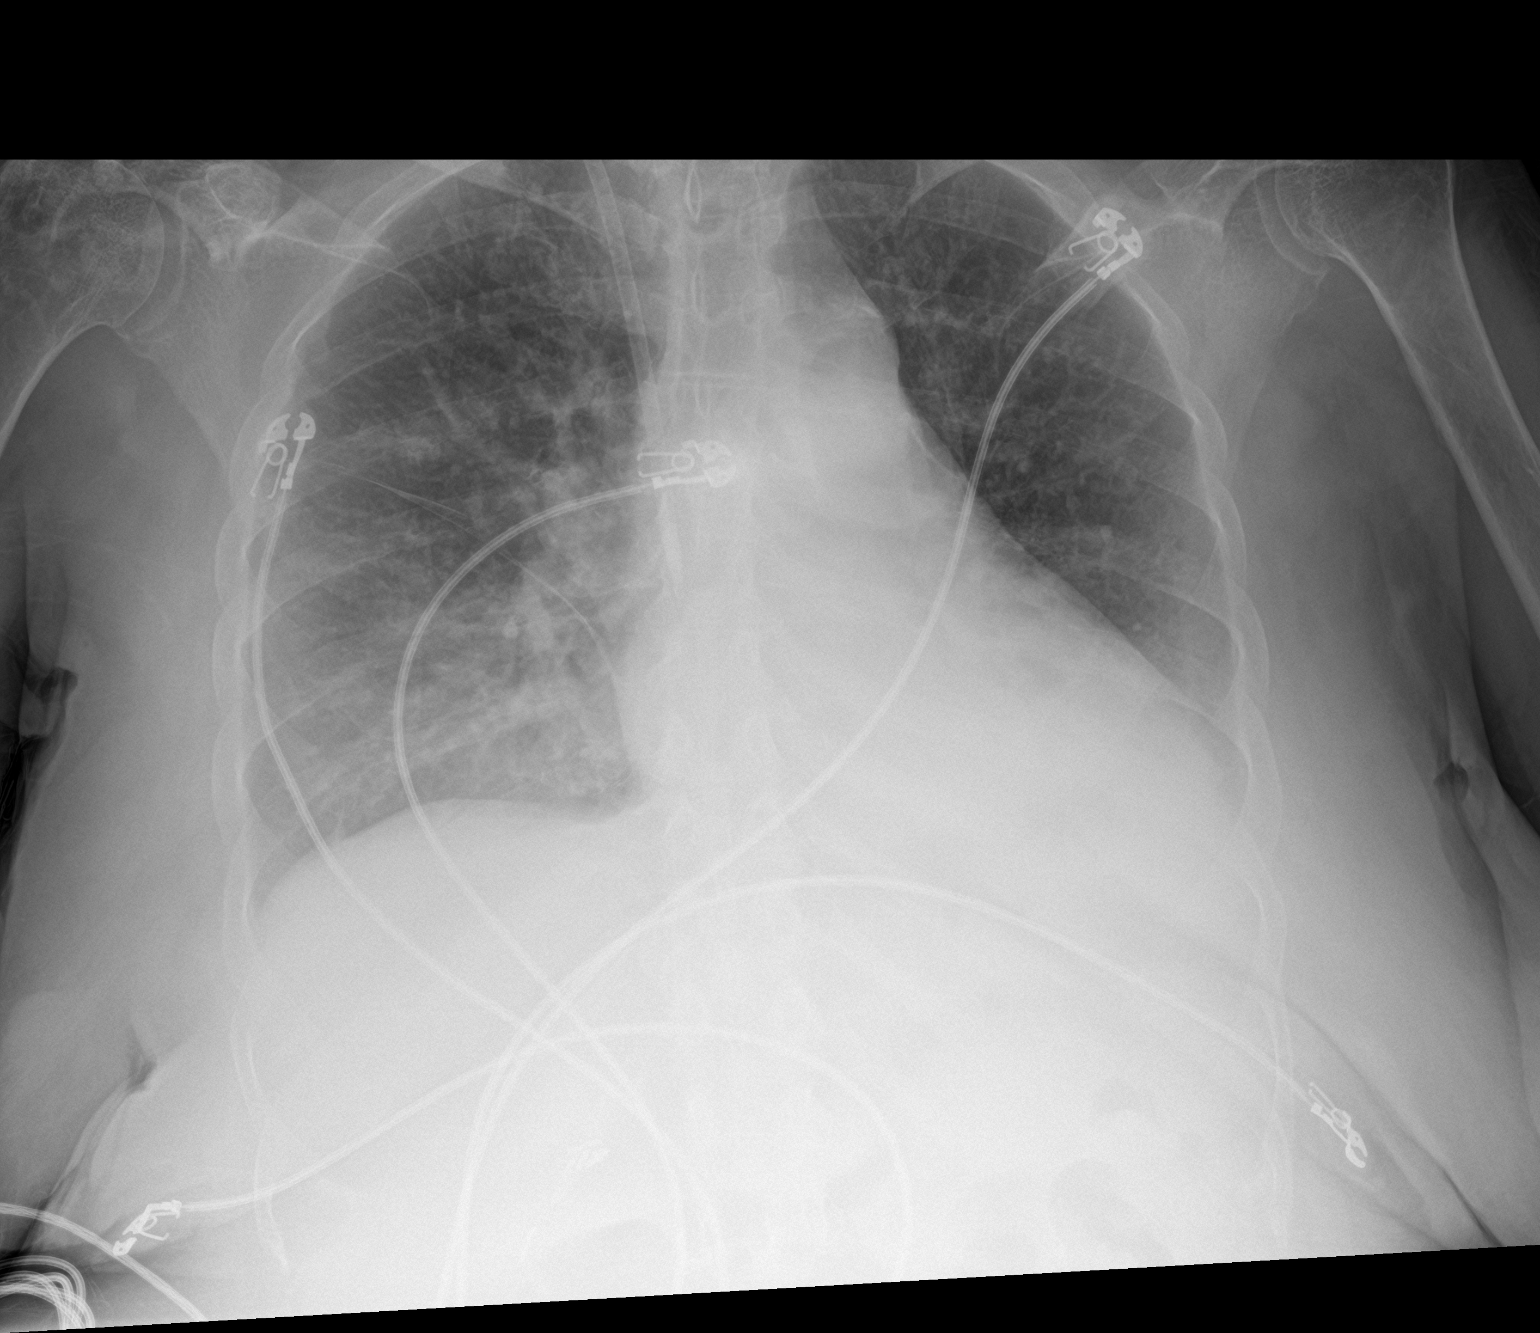

[chest lat]
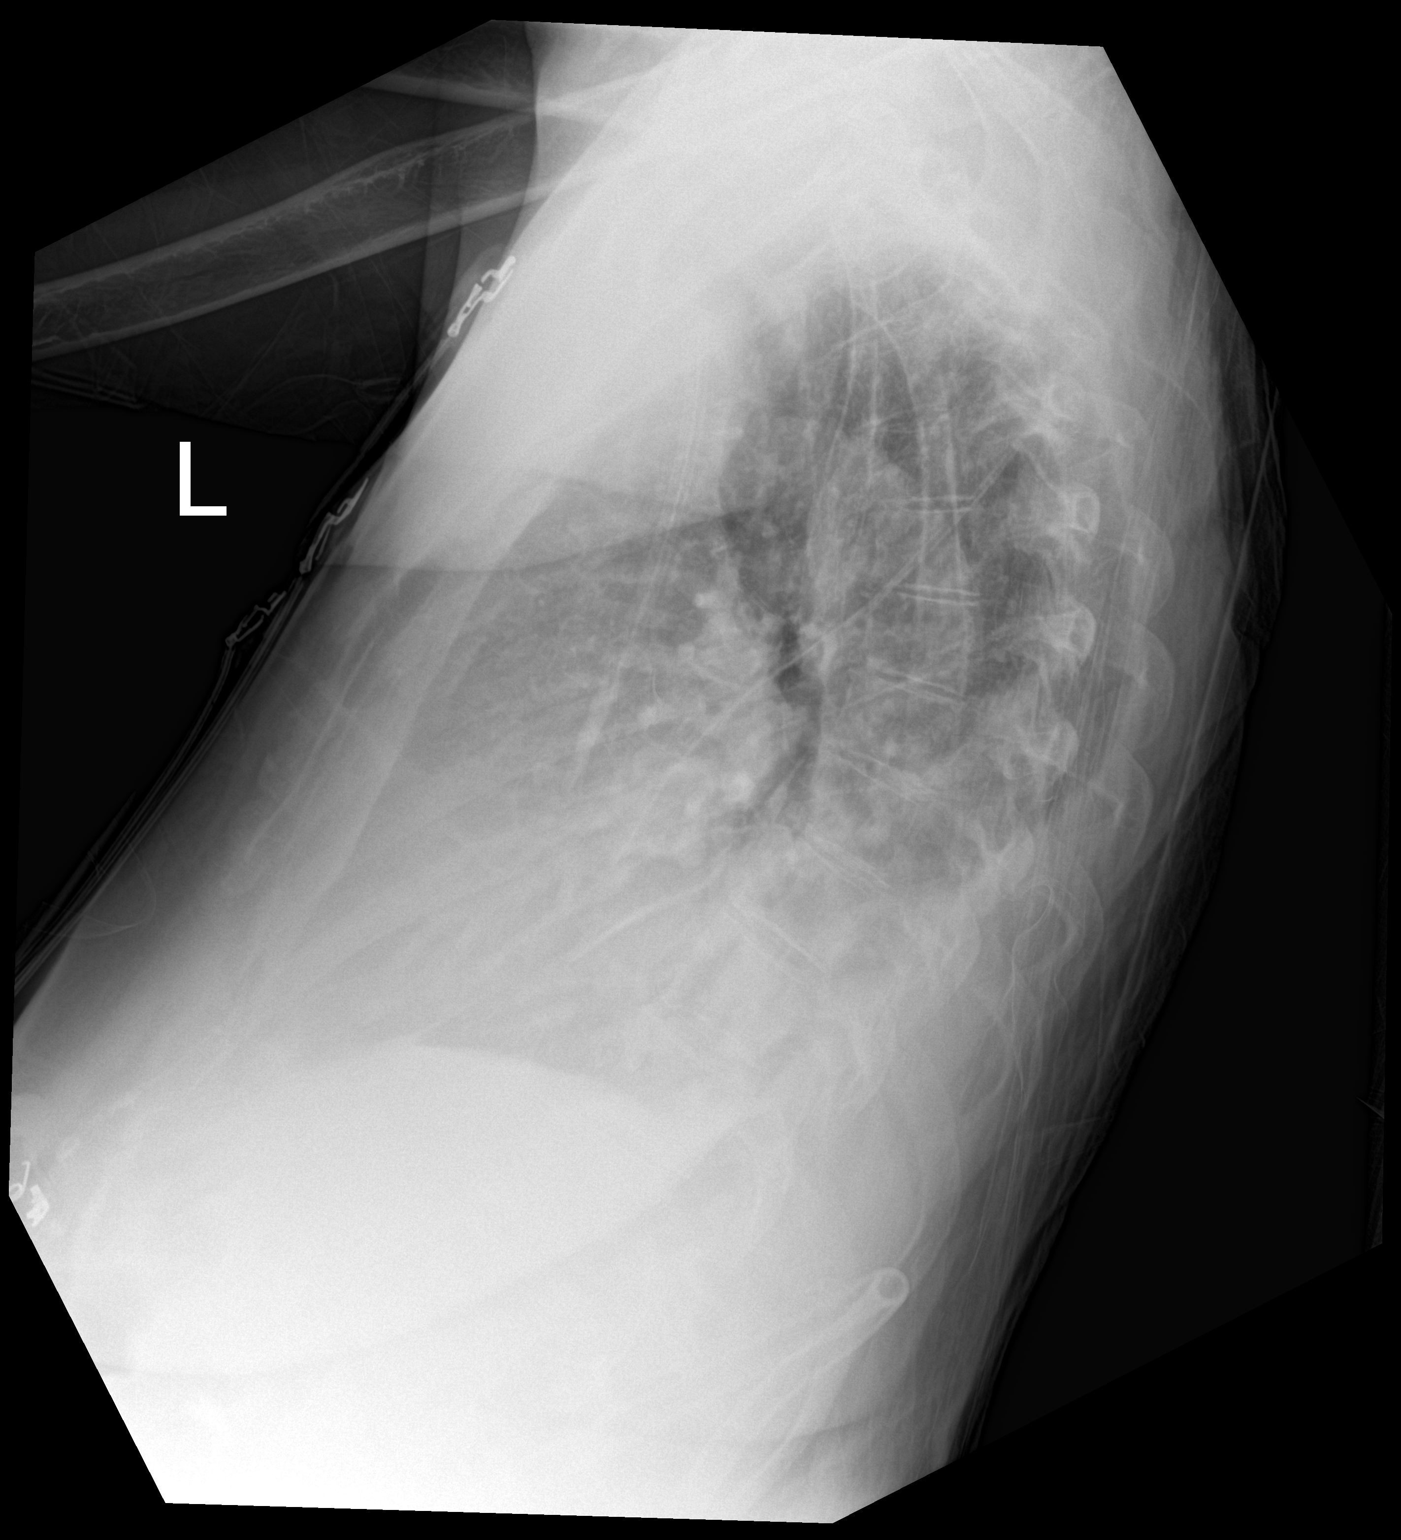

[2 of 2 positions shown; findings below may reference images not displayed]

FINDINGS: Right jugular central venous catheter remains in appropriate
position. Stable mild cardiomegaly. Diffuse interstitial infiltrates
show no significant change. Persistent atelectasis or consolidation
is seen in the left lower lobe. Probable small left pleural effusion
again noted.
IMPRESSION: Stable mild cardiomegaly and diffuse interstitial infiltrates.

Stable left lower lobe atelectasis versus consolidation, and
probable small left pleural effusion.

## 2020-03-01 MED ORDER — NIFEDIPINE ER OSMOTIC RELEASE 30 MG PO TB24: 90.0000 mg | ORAL_TABLET | Freq: Every day | ORAL | Status: DC

## 2020-03-01 MED ORDER — OXYCODONE HCL 5 MG PO TABS
5.0000 mg | ORAL_TABLET | Freq: Once | ORAL | Status: AC
  Administered 2020-03-01: 5 mg via ORAL
  Filled 2020-03-01: qty 1

## 2020-03-01 MED ORDER — NIFEDIPINE ER OSMOTIC RELEASE 30 MG PO TB24
90.0000 mg | ORAL_TABLET | Freq: Every day | ORAL | Status: DC
  Administered 2020-03-01 – 2020-03-11 (×11): 90 mg via ORAL
  Filled 2020-03-01 (×11): qty 3

## 2020-03-01 MED ORDER — NIFEDIPINE ER OSMOTIC RELEASE 30 MG PO TB24: 60.0000 mg | ORAL_TABLET | Freq: Every day | ORAL | Status: DC

## 2020-03-01 MED ORDER — DARBEPOETIN ALFA 40 MCG/0.4ML IJ SOSY
40.0000 ug | PREFILLED_SYRINGE | Freq: Once | INTRAMUSCULAR | Status: AC
  Administered 2020-03-01: 40 ug via SUBCUTANEOUS
  Filled 2020-03-01: qty 0.4

## 2020-03-01 MED ORDER — FUROSEMIDE 10 MG/ML IJ SOLN
80.0000 mg | Freq: Once | INTRAMUSCULAR | Status: AC
  Administered 2020-03-01: 80 mg via INTRAVENOUS
  Filled 2020-03-01: qty 8

## 2020-03-01 NOTE — Progress Notes (Signed)
[facility] Progress Note  Name: Barbara Crawford MRN: [id number] DOB: 03/14/1955  Chief Complaint:  Back pain and lower extremity swelling  Subjective:  She was moved to the floor in the interim.  Had 250 mL uop over 2/26.  Had first HD on 2/26 with 0.5 kg UF.  We discussed plans for biopsy hopefully tomorrow and next HD Tuesday.  We discussed risks/benefits/indications for ESA and she consents to receive.  Review of systems:   Denies shortness of breath  Has had some chest discomfort post CPR but this is better Denies n/v   --------- Background on consult:   Barbara Crawford is an 65 y.o. female with HTN who is seen for evaluation and management of AKI.  She had COVID + in 01/2020 (unvaccinated), had 1 syncopal episode during [place], recovered at home and had some fatigue but was better enough to return to work and generally back to baseline.  Started having low back pain and decreased voiding for about month, worse for a week.  Urine character is normal per her but daughter thinks urine is cloudy and malodorous.   Using topical aspercreme and bengay for back with some help.  Poor appetite, more sleepy lately.  No emesis, no hiccups. +pruritis but thinks dry skin.  No confusion. Hydrocodone/APAP given urgent care yesterday; also takes nifedipine, lisinopril.  APAP only, no NSAIDs.  No h/o kidney stones.  Wt up and down lately but usually 150lbs.  LE edema x 1 week.  Back pain worse today and LE swelling worse so came into [facility] ED where labs showing Hb 6s, WBC 8.6, Plt 282, Na 130, K 5.4, bicarb 9, BUN 144, Cr 30, Ca 6.8, Alb 2.1, LFTs ok, Phos > 30.  CT renal stone prot showing L 69m nonobstructing nephrolith, R kidney ok.  PCP referred her to nephrology - has outpt apt in 03/2020. Says abnormal kidney function and anemia but those labs aren't available.  She's not sure if urine was sent.  No fevers, chills.  No eye issues, no oral ulcers.  No bleeding.  Constipation  lately.  Has a tooth infection x 1 year - couldn't afford extraction so it's been ongoing.  No family h/o ESRD or CKD.     Intake/Output Summary (Last 24 hours) at 03/01/2020 0816 Last data filed at 03/01/2020 0600 Gross per 24 hour  Intake 320 ml  Output 750 ml  Net -430 ml    Vitals:  Vitals:   03/01/20 0038 03/01/20 0139 03/01/20 0324 03/01/20 0415  BP: (!) 153/83  140/85 (!) 143/81  Pulse: (!) 105  90 94  Resp: '19 19 16 17  '$ Temp:   98.6 F (37 C)   TempSrc:   Oral   SpO2: 100%  100%   Weight:      Height:         Physical Exam:  General adult female in bed in NAC HEENT normocephalic atraumatic  Neck normal neck circumference trachea midline Lungs clear to auscultation bilaterally and unlabored at rest Heart S1S2 no rub Abdomen soft nontender with limits of sedation nondistended Extremities no lower extremity edema  Neuro - alert and oriented x 3 provides hx and follows commands psych normal mood and affect Access RIJ nontunneled catheter   Medications reviewed   Labs:  BMP Latest Ref Rng & Units 03/01/2020 02/29/2020 02/28/2020  Glucose 70 - 99 mg/dL 101(H) 93 101(H)  BUN 8 - 23 mg/dL 18 35(H) 30(H)  Creatinine 0.44 -  1.00 mg/dL 4.31(H) 6.17(H) 5.20(H)  Sodium 135 - 145 mmol/L 135 130(L) 134(L)  Potassium 3.5 - 5.1 mmol/L 4.0 4.6 4.4  Chloride 98 - 111 mmol/L 97(L) 98 100  CO2 22 - 32 mmol/L 26 21(L) 24  Calcium 8.9 - 10.3 mg/dL 8.4(L) 7.2(L) 7.2(L)     Assessment/Plan:   **AKI:   - 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  Appears was brewing in 12/2019 with PCP referring her to nephrology then.  Hep C neg, hep B s antigen neg.  nontunneled catheter on 02/25/20.  UA with > 300 mg/dl protein and 0-5 RBC. ANA positive.  ANCA negative.  Complement normal. RPR nonreactive. Anti-GBM neg. ASO normal. SPEP no M spike. Free light chain ratio normal.  Started on CRRT on 2/24 after nontunneled catheter with IR.  ------------------ - HD on 3/1 likely - last on  2/26 - UPEP ordered - not sent yet (anuric) - Requested outpatient records  - Plan for a renal biopsy on 2/28.  consulted IR and will fill out biopsy form for [facility]  Appreciate IR - Note still has nontunneled catheter  - Hold ACE inhibitor  - Can discontinue foley catheter   ** Proteinuria - nephrotic range - up/cr ratio 24980 mg/g May be 2/2 lupus as ANA positive.  Getting a renal biopsy   ** Cardiac arrest - post-arrest care per primary    **HTN - .  Hold ACEi in setting of AKI. Note on nifedipine at home 120 mg daily (90 mg am and 30 mg pm).  Just recently with hypotension.  Start back nifedipine at 90 mg daily.  **Anemia  Severe - s/p PRBC's and thankfully Hb improved. myeloma labs as above.  Monitor for GI losses. aranesp 40 mcg ordered for 2/27     **Seizure activity - neurology consulted.  Metabolic derangements including azotemia and hypocalcemia  **AGMA:  felt secondary to AKI - on renal replacement therapy     **Hyperkalemia:  improved with RRT   **Hyperphosphatemia:  Phos reported > 30 initially; note uric acid was initially elevated as well at 9.  Much improved s/p starting RRT.  Repeat uric acid improved   **Hypocalcemia:  PTH 193. Improving with RRT and s/p repletion  **Back pain:  Seems out of proportion to small stone; AKI generally not painful.  Seems MSK in origin.  W/u and treatment per primary.   dispo - continue inpatient monitoring   Claudia Desanctis, MD 03/01/2020  8:38 AM

## 2020-03-01 NOTE — Progress Notes (Signed)
eLink Physician-Brief Progress Note Patient Name: Barbara Crawford DOB: [birth year] MRN: [id number]   Date of Service  03/01/2020  HPI/Events of Note  Patient c/o back pain. No relief with Tylenol.   eICU Interventions  Plan: 1. Oxycodone IR 5 mg PO X 1 now.      Intervention Category Major Interventions: Other:  Sommer,Steven Cornelia Copa 03/01/2020, 1:10 AM

## 2020-03-01 NOTE — Progress Notes (Addendum)
[facility]  PROGRESS NOTE  Ardice Boyan CBS:[id number] DOB: 1955-04-28 DOA: 02/25/2020 PCP: Vonna Drafts, FNP   Brief HPI:   65 year old female with history of hypertension, who had Covid in December, at that time her PCP told that she had slight decrease in kidney function.  Patient noted to have increased lower extremity edema with left flank pain.  Work-up showed creatinine 29.9, BUN 44.  Patient admitted for further work-up.  She had temporary HD catheter placed by IR and afterwards had a PEA arrest and associated seizure activity.  She was transferred to ICU, intubated and then extubated on 02/28/2020.  Patient was seen by nephrology, was found to have nephrotic range proteinuria.  Renal biopsy planned for tomorrow morning. TRH resumed care on 03/01/2020    Subjective   Patient seen and examined, denies any complaints.   Assessment/Plan:     1. Acute kidney injury-patient presented with significantly elevated BUN/creatinine, unclear etiology.  Nephrology has been consulted.  Work-up shows nephrotic range proteinuria, plan for kidney biopsy in a.m.  She has positive ANA so there is possibility that she may have lupus nephropathy.  Hemodialysis was started catheter was placed by IR.  Management per nephrology.   2. Acute hypoxemic respiratory failure-secondary to cardiac arrest, patient was intubated.  Now resolved.  She was extubated on 02/28/2020. 3. Seizure-patient had seizure and loss of pulse s/p CPR.  She was transferred to ICU and intubated.  Neurology was consulted.  Patient had another seizure event lasting for minutes in the ICU.  Patient started on [place], neurology following.  Plan to discharge on Keppra 500 mg p.o. twice daily as per neurology. 4. Hypertension-blood pressure is mildly elevated, continue nifedipine, as needed labetalol 5. Normocytic anemia-hemoglobin is 7.8, transfuse for hemoglobin less than 7.  Patient started on Aranesp as per  nephrology. 6. Transaminitis-mild, patient has mild transaminitis.  Unclear etiology, likely from shock liver.  Will follow LFTs in a.m. 7. Fever-patient was started on ceftriaxone empirically for fever, UA was obtained yesterday, which was unremarkable.  Chest x-ray showed possibility of left lower lobe infiltrate.  Will obtain PA and lateral view today to further get a better idea of infiltrate.  In the meantime we will continue with ceftriaxone.      COVID-19 Labs  No results for input(s): DDIMER, FERRITIN, LDH, CRP in the last 72 hours.  Lab Results  Component Value Date   [place] NEGATIVE 02/25/2020     Scheduled medications:   . B-complex with vitamin C  1 tablet Oral Daily  . chlorhexidine gluconate (MEDLINE KIT)  15 mL Mouth Rinse BID  . Chlorhexidine Gluconate Cloth  6 each Topical Q0600  . Chlorhexidine Gluconate Cloth  6 each Topical Q0600  . darbepoetin (ARANESP) injection - NON-DIALYSIS  40 mcg Subcutaneous Once  . docusate sodium  100 mg Oral BID  . feeding supplement  237 mL Oral TID BM  . heparin  5,000 Units Subcutaneous Q8H  . levETIRAcetam  250 mg Oral Q T,Th,Sa-HD  . levETIRAcetam  500 mg Oral BID  . mouth rinse  15 mL Mouth Rinse BID  . NIFEdipine  90 mg Oral Daily  . sodium chloride flush  10-40 mL Intracatheter Q12H         CBG: Recent Labs  Lab 02/28/20 1921 02/28/20 2303 02/29/20 0308 02/29/20 0832 02/29/20 1114  GLUCAP 109* 87 84 97 115*    SpO2: 91 % O2 Flow Rate (L/min): 2 L/min FiO2 (%): 30 %  CBC: Recent Labs  Lab 02/25/20 1753 02/26/20 0305 02/26/20 1457 02/26/20 1701 02/26/20 2110 02/27/20 0619 02/28/20 0501 02/29/20 0352 03/01/20 0050  WBC 8.6   < > 11.0*  --   --  6.4 7.7 14.6* 15.3*  NEUTROABS 7.0  --  7.4  --   --   --   --   --  13.1*  HGB 6.3*   < > 6.4*   < > 8.7* 8.5* 8.1* 7.8* 7.8*  HCT 19.9*   < > 20.2*   < > 24.9* 23.8* 24.7* 23.3* 23.2*  MCV 86.1   < > 85.6  --   --  81.0 85.2 86.0 86.2  PLT 282    < > 317  --   --  192 184 176 176   < > = values in this interval not displayed.    Basic Metabolic Panel: Recent Labs  Lab 02/27/20 0619 02/27/20 1600 02/28/20 0501 02/28/20 1556 02/29/20 0352 03/01/20 0050  NA 135 136 136 134* 130* 135  K 4.1 4.2 5.0 4.4 4.6 4.0  CL 101 102 100 100 98 97*  CO2 18* _0 21* 26  GLUCOSE 87 110* 115* 101* 93 101*  BUN 87* 53* 36* 30* 35* 18  CREATININE 17.40* 10.90* 6.62* 5.20* 6.17* 4.31*  CALCIUM 6.6* 7.4* 7.4* 7.2* 7.2* 8.4*  MG 1.7  --  2.3  --  2.1  --   PHOS 7.7* 5.0* 3.6 3.4 3.1 3.7     Liver Function Tests: Recent Labs  Lab 02/25/20 1753 02/26/20 0305 02/26/20 1457 02/26/20 1529 02/27/20 1600 02/28/20 0501 02/28/20 1556 02/29/20 0352 03/01/20 0050  AST 26  --  46*  --   --   --   --   --  103*  ALT 35  --  38  --   --   --   --   --  54*  ALKPHOS 50  --  49  --   --   --   --   --  60  BILITOT 0.5  --  0.3  --   --   --   --   --  0.3  PROT 7.8  --  6.6  --   --   --   --   --  5.8*  ALBUMIN 2.1*   < > 1.6*   < > 1.7* 1.6* 1.6* 1.6* 1.4*   < > = values in this interval not displayed.     Antibiotics: Anti-infectives (From admission, onward)   Start     Dose/Rate Route Frequency Ordered Stop   02/29/20 1000  cefTRIAXone (ROCEPHIN) 2 g in sodium chloride 0.9 % 100 mL IVPB        2 g 200 mL/hr over 30 Minutes Intravenous Every 24 hours 02/29/20 0910         DVT prophylaxis: Heparin  Code Status: Full code  Family Communication: No family at bedside   Consultants:  Nephrology  PCCM  Procedures:      Objective   Vitals:   03/01/20 0700 03/01/20 0800 03/01/20 1005 03/01/20 1200  BP:  (!) 178/90 (!) 174/91 (!) 168/83  Pulse:   88 93  Resp: _1 Temp:  98.7 F (37.1 C) 98.5 F (36.9 C) 98.7 F (37.1 C)  TempSrc:  Oral Oral Oral  SpO2:  98% 95% 91%  Weight:      Height:        Intake/Output Summary (  Last 24 hours) at 03/01/2020 1244 Last data filed at 03/01/2020 1200 Gross per 24  hour  Intake 344.77 ml  Output 700 ml  Net -355.23 ml    02/25 1901 - 02/27 0700 In: 320 [P.O.:320] Out: 770 [Urine:270]  Filed Weights   02/29/20 0418 02/29/20 1315 02/29/20 1624  Weight: 74 kg 75.3 kg 74.8 kg    Physical Examination:    General-appears in no acute distress  Heart-S1-S2, regular, no murmur auscultated  Lungs-clear to auscultation bilaterally, no wheezing or crackles auscultated  Abdomen-soft, nontender, no organomegaly  Extremities-no edema in the lower extremities  Neuro-alert, oriented x3, no focal deficit noted    Status is: Inpatient  Dispo: The patient is from: Home              Anticipated d/c is to: Home              Anticipated d/c date is: 03/07/2020              Patient currently not stable for discharge  Barrier to discharge-work-up for acute kidney injury            Data Reviewed:   Recent Results (from the past 240 hour(s))  Resp Panel by RT-PCR (Flu A&B, Covid) Nasopharyngeal Swab     Status: None   Collection Time: 02/25/20  6:12 PM   Specimen: Nasopharyngeal Swab; Nasopharyngeal(NP) swabs in vial transport medium  Result Value Ref Range Status   SARS Coronavirus 2 by RT PCR NEGATIVE NEGATIVE Final    Comment: (NOTE) SARS-CoV-2 target nucleic acids are NOT DETECTED.  The SARS-CoV-2 RNA is generally detectable in upper respiratory specimens during the acute phase of infection. The lowest concentration of SARS-CoV-2 viral copies this assay can detect is 138 copies/mL. A negative result does not preclude SARS-Cov-2 infection and should not be used as the sole basis for treatment or other patient management decisions. A negative result may occur with  improper specimen collection/handling, submission of specimen other than nasopharyngeal swab, presence of viral mutation(s) within the areas targeted by this assay, and inadequate number of viral copies(<138 copies/mL). A negative result must be combined with clinical  observations, patient history, and epidemiological information. The expected result is Negative.  Fact Sheet for Patients:  BloggerCourse.com  Fact Sheet for Healthcare Providers:  SeriousBroker.it  This test is no t yet approved or cleared by the [place] FDA and  has been authorized for detection and/or diagnosis of SARS-CoV-2 by FDA under an Emergency Use Authorization (EUA). This EUA will remain  in effect (meaning this test can be used) for the duration of the COVID-19 declaration under Section 564(b)(1) of the Act, 21 U.S.C.section 360bbb-3(b)(1), unless the authorization is terminated  or revoked sooner.       Influenza A by PCR NEGATIVE NEGATIVE Final   Influenza B by PCR NEGATIVE NEGATIVE Final    Comment: (NOTE) The Xpert Xpress SARS-CoV-2/FLU/RSV plus assay is intended as an aid in the diagnosis of influenza from Nasopharyngeal swab specimens and should not be used as a sole basis for treatment. Nasal washings and aspirates are unacceptable for Xpert Xpress SARS-CoV-2/FLU/RSV testing.  Fact Sheet for Patients: BloggerCourse.com  Fact Sheet for Healthcare Providers: SeriousBroker.it  This test is not yet approved or cleared by the [place] FDA and has been authorized for detection and/or diagnosis of SARS-CoV-2 by FDA under an Emergency Use Authorization (EUA). This EUA will remain in effect (meaning this test can be used) for the duration of  the COVID-19 declaration under Section 564(b)(1) of the Act, 21 U.S.C. section 360bbb-3(b)(1), unless the authorization is terminated or revoked.  Performed at [facility], [place] [address] [place], [place] [address]   MRSA PCR Screening     Status: None   Collection Time: 02/26/20  4:00 PM   Specimen: Nasopharyngeal  Result Value Ref Range Status   MRSA by PCR NEGATIVE NEGATIVE Final    Comment:         The GeneXpert MRSA Assay (FDA approved for NASAL specimens only), is one component of a comprehensive MRSA colonization surveillance program. It is not intended to diagnose MRSA infection nor to guide or monitor treatment for MRSA infections. Performed at [facility], [place] [address] [place], [place] [address]   Culture, Urine     Status: None   Collection Time: 02/26/20  5:08 PM   Specimen: Urine, Random  Result Value Ref Range Status   Specimen Description URINE, RANDOM  Final   Special Requests NONE  Final   Culture   Final    NO GROWTH Performed at [facility], [place] [address] [place], [place] [address]    Report Status 02/27/2020 FINAL  Final  Culture, blood (routine x 2)     Status: None (Preliminary result)   Collection Time: 02/26/20 10:43 PM   Specimen: BLOOD  Result Value Ref Range Status   Specimen Description BLOOD SITE NOT SPECIFIED  Final   Special Requests   Final    AEROBIC BOTTLE ONLY Blood Culture results may not be optimal due to an inadequate volume of blood received in culture bottles   Culture   Final    NO GROWTH 4 DAYS Performed at [facility], [place] [address] [place], [place] [address]    Report Status PENDING  Incomplete  Culture, blood (routine x 2)     Status: None (Preliminary result)   Collection Time: 02/26/20 10:43 PM   Specimen: BLOOD  Result Value Ref Range Status   Specimen Description BLOOD SITE NOT SPECIFIED  Final   Special Requests AEROBIC BOTTLE ONLY Blood Culture adequate volume  Final   Culture   Final    NO GROWTH 4 DAYS Performed at [facility], [address] [place], [place] [address]    Report Status PENDING  Incomplete    Recent Labs  Lab 02/25/20 1753  LIPASE 71*   No results for input(s): AMMONIA in the last 168 hours.  Cardiac Enzymes: No results for input(s): CKTOTAL, CKMB, CKMBINDEX, TROPONINI in the last 168 hours. BNP (last 3 results) Recent Labs    02/25/20 2045  BNP 72.1     ProBNP (last 3 results) No results for input(s): PROBNP in the last 8760 hours.  Studies:  DG CHEST PORT 1 VIEW  Result Date: 02/29/2020 CLINICAL DATA:  Fever and chills. EXAM: PORTABLE CHEST 1 VIEW COMPARISON:  02/26/2020 FINDINGS: Right IJ central venous catheter has tip over the SVC and unchanged. Lungs are somewhat hypoinflated. The left base/retrocardiac region is difficult to evaluate due to prominent overlying soft tissues as cannot exclude interspace process or small effusion in the left base. Subtle hazy prominence of the perihilar vessels which may be due to a degree of vascular congestion. Stable borderline cardiomegaly. Remainder of the exam is unchanged. IMPRESSION: 1. Suggestion of minimal vascular congestion. The left base is not well evaluated on this exam as airspace process or a small effusion is possible. Consider PA and  lateral chest radiograph for better evaluation of the left base. 2. Right IJ central venous catheter unchanged. Electronically Signed   By: Marin Olp M.D.   On: 02/29/2020 10:56       [facility] If 7PM-7AM, please contact night-coverage at [email or web address], Office  [phone]   03/01/2020, 12:44 PM  LOS: 5 days

## 2020-03-02 ENCOUNTER — Inpatient Hospital Stay (HOSPITAL_COMMUNITY): Payer: 59

## 2020-03-02 DIAGNOSIS — N179 Acute kidney failure, unspecified: Secondary | ICD-10-CM | POA: Diagnosis not present

## 2020-03-02 LAB — COMPREHENSIVE METABOLIC PANEL
ALT: 53 U/L — ABNORMAL HIGH (ref 0–44)
AST: 70 U/L — ABNORMAL HIGH (ref 15–41)
Albumin: 1.4 g/dL — ABNORMAL LOW (ref 3.5–5.0)
Alkaline Phosphatase: 57 U/L (ref 38–126)
Anion gap: 11 (ref 5–15)
BUN: 33 mg/dL — ABNORMAL HIGH (ref 8–23)
CO2: 25 mmol/L (ref 22–32)
Calcium: 7.7 mg/dL — ABNORMAL LOW (ref 8.9–10.3)
Chloride: 99 mmol/L (ref 98–111)
Creatinine, Ser: 6.37 mg/dL — ABNORMAL HIGH (ref 0.44–1.00)
GFR, Estimated: 7 mL/min — ABNORMAL LOW (ref >60)
Glucose, Bld: 91 mg/dL (ref 70–99)
Potassium: 4.3 mmol/L (ref 3.5–5.1)
Sodium: 135 mmol/L (ref 135–145)
Total Bilirubin: 0.5 mg/dL (ref 0.3–1.2)
Total Protein: 5.7 g/dL — ABNORMAL LOW (ref 6.5–8.1)

## 2020-03-02 LAB — CBC
HCT: 25 % — ABNORMAL LOW (ref 36.0–46.0)
Hemoglobin: 7.9 g/dL — ABNORMAL LOW (ref 12.0–15.0)
MCH: 27.8 pg (ref 26.0–34.0)
MCHC: 31.6 g/dL (ref 30.0–36.0)
MCV: 88 fL (ref 80.0–100.0)
Platelets: 237 10*3/uL (ref 150–400)
RBC: 2.84 MIL/uL — ABNORMAL LOW (ref 3.87–5.11)
RDW: 15.9 % — ABNORMAL HIGH (ref 11.5–15.5)
WBC: 12.7 10*3/uL — ABNORMAL HIGH (ref 4.0–10.5)
nRBC: 0 % (ref 0.0–0.2)

## 2020-03-02 LAB — CULTURE, BLOOD (ROUTINE X 2)
Culture: NO GROWTH
Culture: NO GROWTH
Special Requests: ADEQUATE

## 2020-03-02 LAB — GLUCOSE, CAPILLARY: Glucose-Capillary: 82 mg/dL (ref 70–99)

## 2020-03-02 IMAGING — US US BIOPSY
1 series · 6 of 6 positions shown · non-contrast
Comparison: CT [DATE]

CLINICAL DATA: Renal insufficiency, acute kidney injury,
hypertension

EXAM:
ULTRASOUND GUIDED RENAL CORE BIOPSY
TECHNIQUE: Survey ultrasound was performed and an appropriate skin entry site
was localized. Markedly echogenic renal parenchyma noted
bilaterally. Site was marked, prepped with Betadine, draped in usual
sterile fashion, infiltrated locally with 1% lidocaine.

[Series 1: us biopsy (kidney) · 6 of 6 slices shown]
[im 1/6]
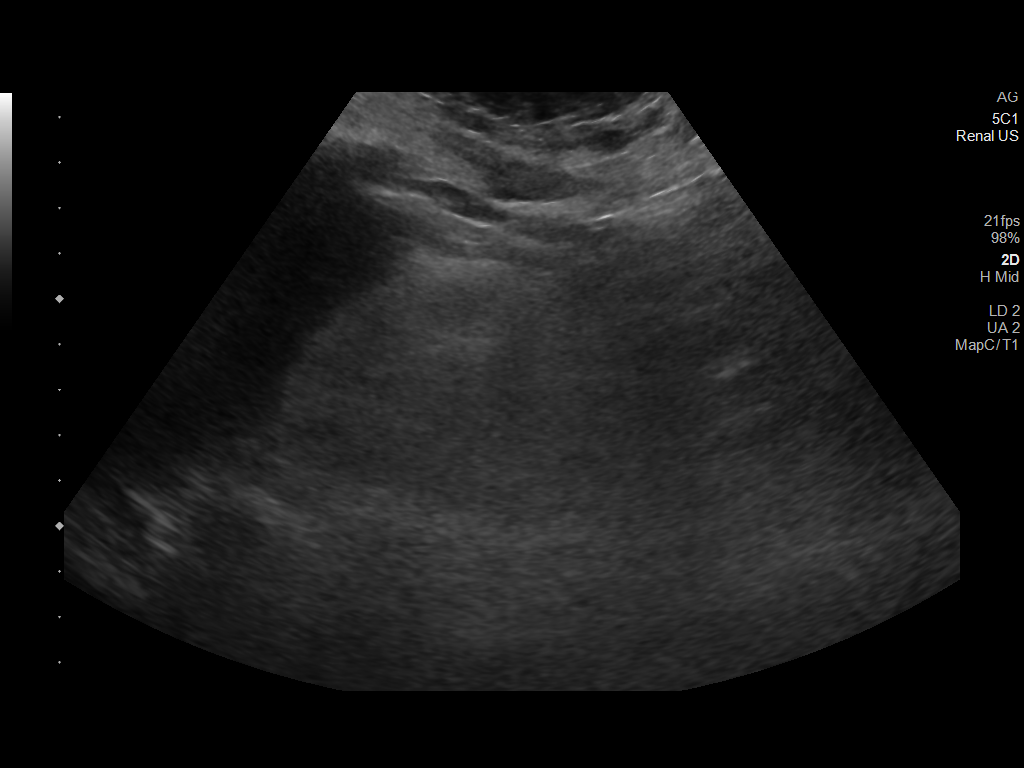
[im 2/6]
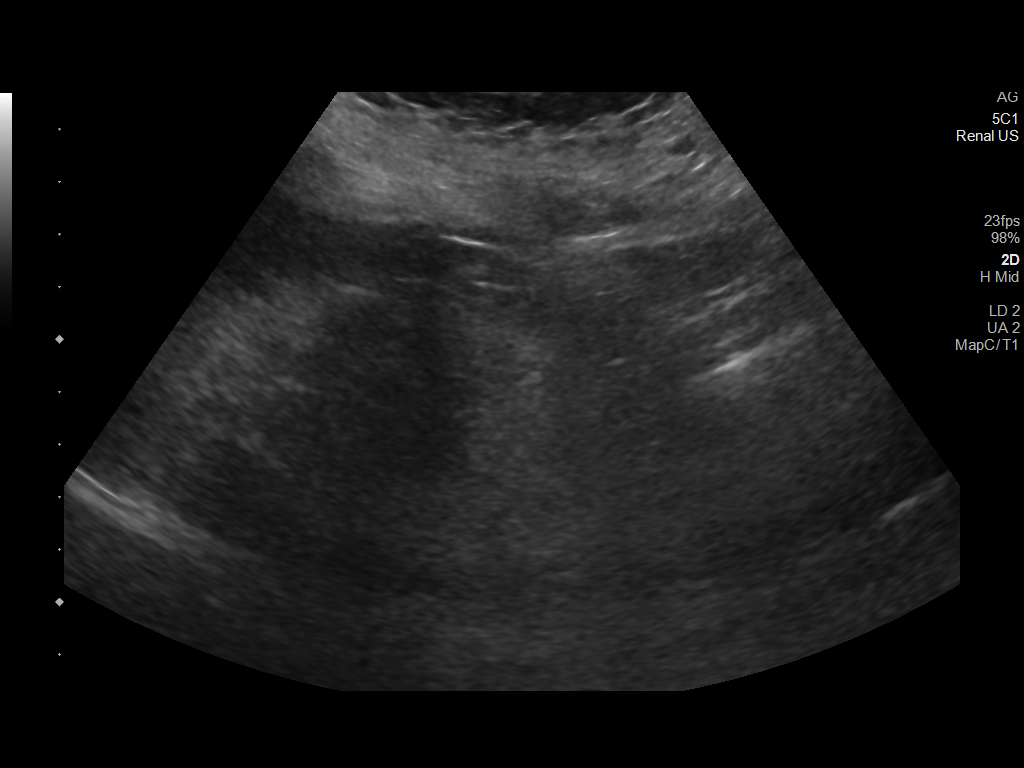
[im 3/6]
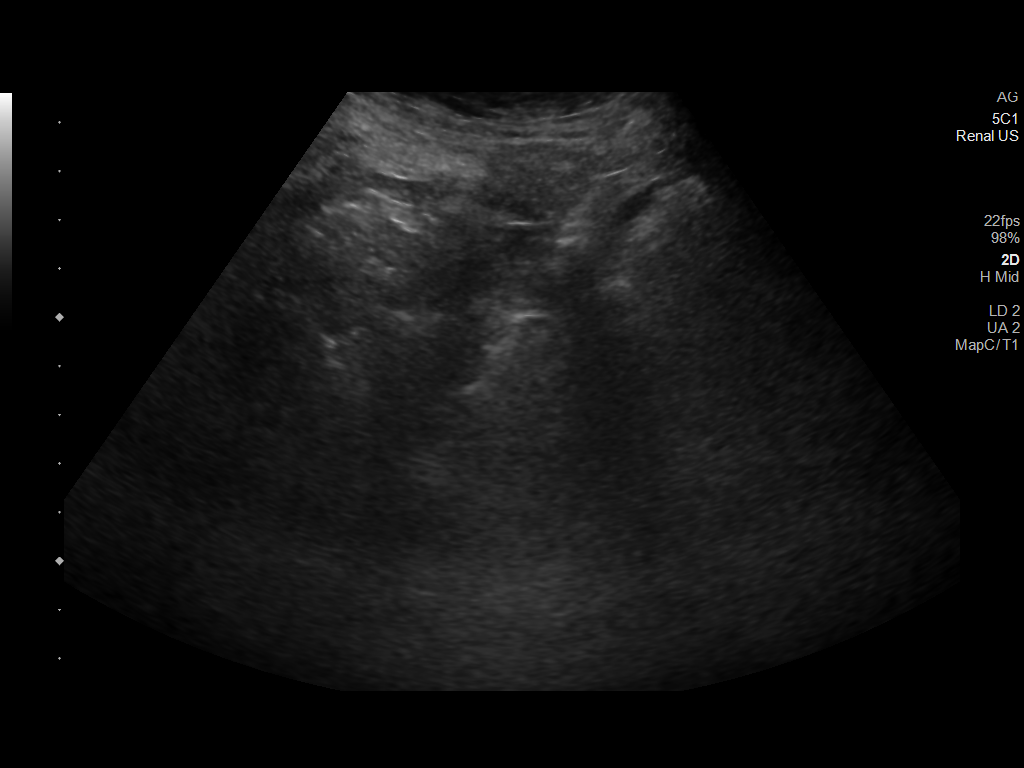
[im 4/6]
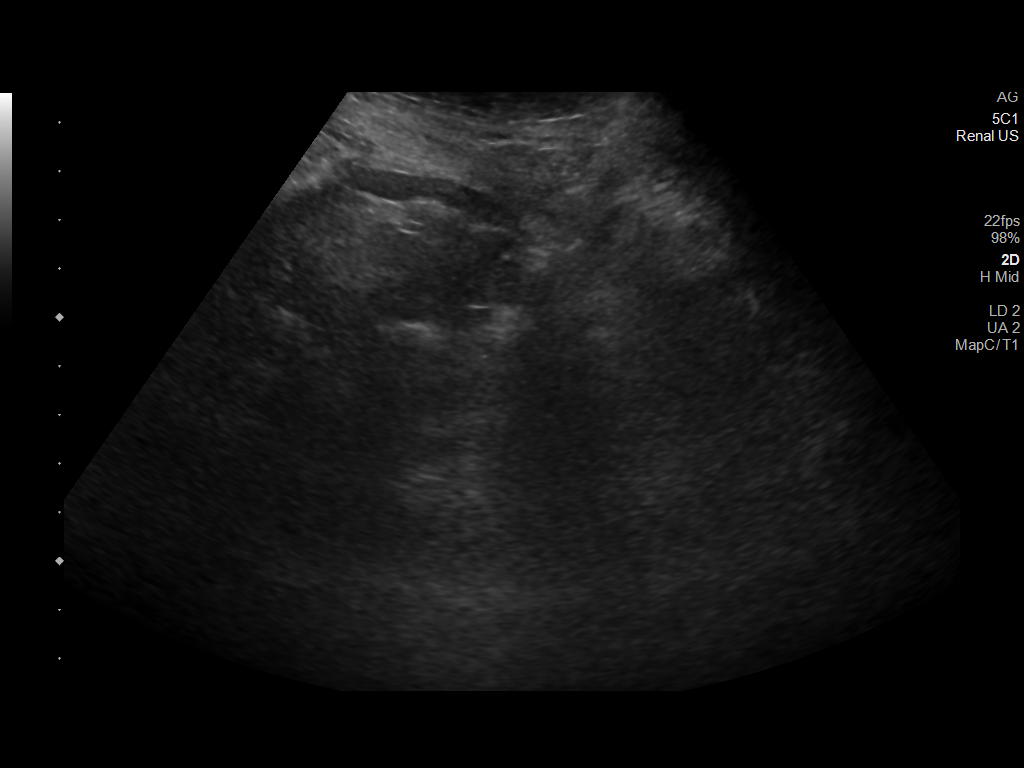
[im 5/6]
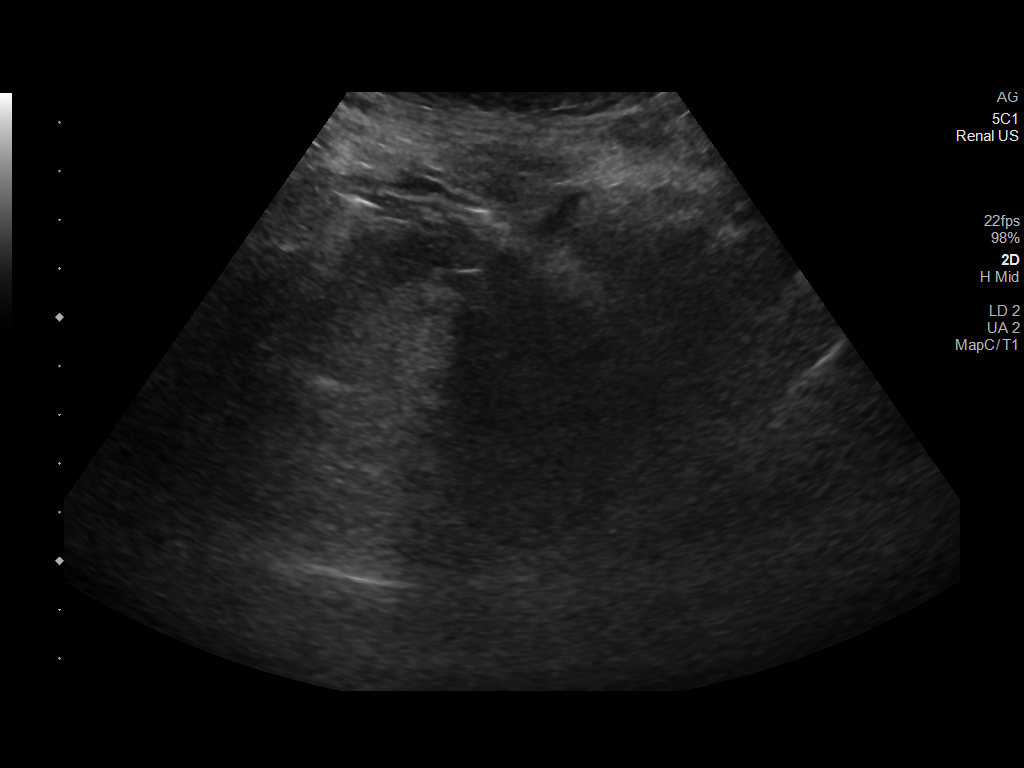
[im 6/6]
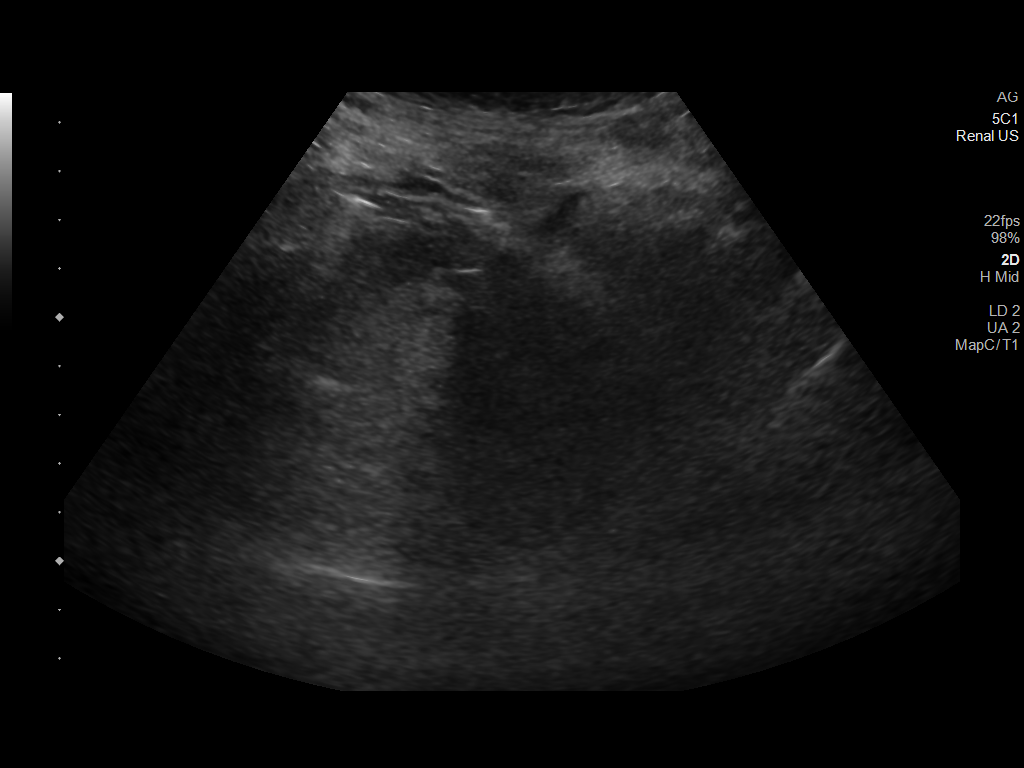

[6 of 6 positions shown; findings below may reference images not displayed]

Intravenous Fentanyl [8L] and Versed 1mg were administered as
conscious sedation during continuous monitoring of the patient's
level of consciousness and physiological / cardiorespiratory status
by the radiology RN, with a total moderate sedation time of 12
minutes.

Under real time ultrasound guidance, a 15 gauge trocar needle was
advanced to the margin of the lower pole of the left kidney for 3
coaxial 16 gauge core biopsy needle passes. The core samples were
submitted to pathology. The patient tolerated procedure well.

COMPLICATIONS:
None immediate
IMPRESSION: 1. Technically successful ultrasound-guided core renal biopsy , left
lower pole.

## 2020-03-02 MED ORDER — BIOTENE DRY MOUTH MT LIQD: 15.0000 mL | OROMUCOSAL | Status: DC | PRN

## 2020-03-02 MED ORDER — GUAIFENESIN 100 MG/5ML PO SOLN
5.0000 mL | ORAL | Status: DC | PRN
  Administered 2020-03-02 – 2020-03-03 (×3): 100 mg via ORAL
  Filled 2020-03-02 (×3): qty 5
  Filled 2020-03-02 (×2): qty 25

## 2020-03-02 MED ORDER — HEPARIN SODIUM (PORCINE) 5000 UNIT/ML IJ SOLN: 5000.0000 [IU] | Freq: Three times a day (TID) | INTRAMUSCULAR | Status: DC

## 2020-03-02 MED ORDER — OXYCODONE HCL 5 MG PO TABS
5.0000 mg | ORAL_TABLET | ORAL | Status: DC | PRN
  Administered 2020-03-02 – 2020-03-03 (×2): 5 mg via ORAL
  Filled 2020-03-02 (×4): qty 1

## 2020-03-02 MED ORDER — FENTANYL CITRATE (PF) 100 MCG/2ML IJ SOLN
INTRAMUSCULAR | Status: AC | PRN
  Administered 2020-03-02: 50 ug via INTRAVENOUS

## 2020-03-02 MED ORDER — HYDROMORPHONE HCL 1 MG/ML IJ SOLN
1.0000 mg | Freq: Once | INTRAMUSCULAR | Status: AC
Start: 2020-03-02 — End: 2020-03-02
  Administered 2020-03-02: 1 mg via INTRAVENOUS
  Filled 2020-03-02: qty 1

## 2020-03-02 MED ORDER — LIDOCAINE HCL (PF) 1 % IJ SOLN
INTRAMUSCULAR | Status: AC
  Filled 2020-03-02: qty 30

## 2020-03-02 MED ORDER — MIDAZOLAM HCL 2 MG/2ML IJ SOLN
INTRAMUSCULAR | Status: AC
  Filled 2020-03-02: qty 2

## 2020-03-02 MED ORDER — MIDAZOLAM HCL 2 MG/2ML IJ SOLN
INTRAMUSCULAR | Status: AC | PRN
  Administered 2020-03-02: 1 mg via INTRAVENOUS

## 2020-03-02 MED ORDER — FENTANYL CITRATE (PF) 100 MCG/2ML IJ SOLN
INTRAMUSCULAR | Status: AC
  Filled 2020-03-02: qty 2

## 2020-03-02 NOTE — Progress Notes (Signed)
PROGRESS NOTE    Barbara Crawford  ZOX:[id number] DOB: [birth year] DOA: 02/25/2020 PCP: Vonna Drafts, FNP   Brief Narrative: 65 year old with past medical history significant for hypertension, who had Covid in December, at that time she was told by her PCP that she had a slight decrease in kidney function.  Patient noted to have increased lower extremity edema and left flank pain.  Work-up in the ED showed creatinine of 29, BUN 44.  Patient was admitted for further work-up.  Patient had temporary hemodialysis catheter placed by IR and afterward had PEA arrest associated seizure-like activity.  She was transferred to ICU, intubated and then subsequently extubated 12/27/2020.  Patient was evaluated by nephrology, was found to have nephrotic range proteinuria.  Patient underwent renal biopsy 12/30/2020.  TR[facility] assumed patient care on 03/01/2020.    Assessment & Plan:   Principal Problem:   Acute kidney failure (HCC) Active Problems:   Anemia   Uremia   HTN (hypertension)   Ventilator dependence ([place])   1-Acute Kidney Injury: Patient presented with elevation of BUN and creatinine unclear etiology.  Nephrotic range proteinuria. -ANA positive, protein electrophoresis not M spike detected, ASO negative, C 3; 117, C 4; 35, ANCA negative, GBM antibody negative.  Strand DNA negative, SCL 70+, SS a IgG more than 8+, chromatin antibody positive. -Plan for kidney biopsy today -Patient was started on hemodialysis, she had hemodialysis catheter placed by IR  2-Acute Hypoxic Respiratory Failure, secondary to cardiac arrest, patient was intubated. Patient was subsequently extubated 12/27/2020  3-Seizure: Patient had seizure and loss of pulse status post CPR. Patient was evaluated by neurology who restarted patient on Keppra.  Plan is to discharge patient on Keppra 500 mg twice daily.  4-Hypertension: Continue with nifedipine and labetalol as needed  5-Normocytic anemia: Transfuse for  hemoglobin less than 7 Started on Aranesp, per nephrology.   6-UTI: Patient had fever, UA with more than 50 WBC>  On IV ceftriaxone.  Send urine culture.   7-PNA, left lower lobe infiltrate: Stable chest x-ray 2/27 Continue with ceftriaxone. Will order sputum culture WBC trending down.   8-Transaminases; trending down.   Nutrition Problem: Increased nutrient needs Etiology: acute illness (renal failure requiring CRRT)    Signs/Symptoms: estimated needs    Interventions: Ensure Enlive (each supplement provides 350kcal and 20 grams of protein),MVI,Liberalize Diet  Estimated body mass index is 29.22 kg/m as calculated from the following:   Height as of this encounter: 5' 2.99" (1.6 m).   Weight as of this encounter: 74.8 kg.   DVT prophylaxis: Hold prophylaxis Post renal biopsy Code Status: Full code Family Communication: Care discussed with patient Disposition Plan:  Status is: Inpatient  Remains inpatient appropriate because:IV treatments appropriate due to intensity of illness or inability to take PO   Dispo: The patient is from: Home              Anticipated d/c is to: to be determine              Patient currently is not medically stable to d/c.   Difficult to place patient No        Consultants:   Nephrology  CCM  IR  Procedures:   Kidney Bx 2/28  Antimicrobials:    Subjective: She is sleepy, she is tired.  She would like to eat and is n.p.o. for renal biopsy.  She reports some dysuria.  She also reports sore throat.  Feels that her voice is weak.  Objective:  Vitals:   03/01/20 1944 03/02/20 0018 03/02/20 0234 03/02/20 0410  BP:  (!) 143/79  132/75  Pulse:  (!) 101  99  Resp: 19 19 20    Temp:  98 F (36.7 C)  98.1 F (36.7 C)  TempSrc:  Oral  Oral  SpO2:  97%  96%  Weight:      Height:        Intake/Output Summary (Last 24 hours) at 03/02/2020 0758 Last data filed at 03/01/2020 1800 Gross per 24 hour  Intake 344.77 ml  Output  100 ml  Net 244.77 ml   Filed Weights   02/29/20 0418 02/29/20 1315 02/29/20 1624  Weight: 74 kg 75.3 kg 74.8 kg    Examination:  General exam: Appears calm and comfortable  Respiratory system: BL Crackles.  Cardiovascular system: S1 & S2 heard, RRR. No JVD, murmurs, rubs, gallops or clicks. No pedal edema. Gastrointestinal system: Abdomen is nondistended, soft and nontender. No organomegaly or masses felt. Normal bowel sounds heard. Central nervous system: Alert and oriented. Extremities: Symmetric 5 x 5 power. Skin: No rashes, lesions or ulcers   Data Reviewed: I have personally reviewed following labs and imaging studies  CBC: Recent Labs  Lab 02/25/20 1753 02/26/20 0305 02/26/20 1457 02/26/20 1701 02/26/20 2110 02/27/20 0619 02/28/20 0501 02/29/20 0352 03/01/20 0050  WBC 8.6   < > 11.0*  --   --  6.4 7.7 14.6* 15.3*  NEUTROABS 7.0  --  7.4  --   --   --   --   --  13.1*  HGB 6.3*   < > 6.4*   < > 8.7* 8.5* 8.1* 7.8* 7.8*  HCT 19.9*   < > 20.2*   < > 24.9* 23.8* 24.7* 23.3* 23.2*  MCV 86.1   < > 85.6  --   --  81.0 85.2 86.0 86.2  PLT 282   < > 317  --   --  192 184 176 176   < > = values in this interval not displayed.   Basic Metabolic Panel: Recent Labs  Lab 02/27/20 0619 02/27/20 1600 02/28/20 0501 02/28/20 1556 02/29/20 0352 03/01/20 0050 03/02/20 0324  NA 135 136 136 134* 130* 135 135  K 4.1 4.2 5.0 4.4 4.6 4.0 4.3  CL 101 102 100 100 98 97* 99  CO2 18* 22 24 24  21* 26 25  GLUCOSE 87 110* 115* 101* 93 101* 91  BUN 87* 53* 36* 30* 35* 18 33*  CREATININE 17.40* 10.90* 6.62* 5.20* 6.17* 4.31* 6.37*  CALCIUM 6.6* 7.4* 7.4* 7.2* 7.2* 8.4* 7.7*  MG 1.7  --  2.3  --  2.1  --   --   PHOS 7.7* 5.0* 3.6 3.4 3.1 3.7  --    GFR: Estimated Creatinine Clearance: 8.8 mL/min (A) (by C-G formula based on SCr of 6.37 mg/dL (H)). Liver Function Tests: Recent Labs  Lab 02/25/20 1753 02/26/20 0305 02/26/20 1457 02/26/20 1529 02/28/20 0501 02/28/20 1556  02/29/20 0352 03/01/20 0050 03/02/20 0324  AST 26  --  46*  --   --   --   --  103* 70*  ALT 35  --  38  --   --   --   --  54* 53*  ALKPHOS 50  --  49  --   --   --   --  60 57  BILITOT 0.5  --  0.3  --   --   --   --  0.3  0.5  PROT 7.8  --  6.6  --   --   --   --  5.8* 5.7*  ALBUMIN 2.1*   < > 1.6*   < > 1.6* 1.6* 1.6* 1.4* 1.4*   < > = values in this interval not displayed.   Recent Labs  Lab 02/25/20 1753  LIPASE 71*   No results for input(s): AMMONIA in the last 168 hours. Coagulation Profile: Recent Labs  Lab 02/26/20 2110  INR 1.0   Cardiac Enzymes: No results for input(s): CKTOTAL, CKMB, CKMBINDEX, TROPONINI in the last 168 hours. BNP (last 3 results) No results for input(s): PROBNP in the last 8760 hours. HbA1C: No results for input(s): HGBA1C in the last 72 hours. CBG: Recent Labs  Lab 02/28/20 1921 02/28/20 2303 02/29/20 0308 02/29/20 0832 02/29/20 1114  GLUCAP 109* 87 84 97 115*   Lipid Profile: No results for input(s): CHOL, HDL, LDLCALC, TRIG, CHOLHDL, LDLDIRECT in the last 72 hours. Thyroid Function Tests: No results for input(s): TSH, T4TOTAL, FREET4, T3FREE, THYROIDAB in the last 72 hours. Anemia Panel: No results for input(s): VITAMINB12, FOLATE, FERRITIN, TIBC, IRON, RETICCTPCT in the last 72 hours. Sepsis Labs: No results for input(s): PROCALCITON, LATICACIDVEN in the last 168 hours.  Recent Results (from the past 240 hour(s))  Resp Panel by RT-PCR (Flu A&B, Covid) Nasopharyngeal Swab     Status: None   Collection Time: 02/25/20  6:12 PM   Specimen: Nasopharyngeal Swab; Nasopharyngeal(NP) swabs in vial transport medium  Result Value Ref Range Status   SARS Coronavirus 2 by RT PCR NEGATIVE NEGATIVE Final    Comment: (NOTE) SARS-CoV-2 target nucleic acids are NOT DETECTED.  The SARS-CoV-2 RNA is generally detectable in upper respiratory specimens during the acute phase of infection. The lowest concentration of SARS-CoV-2 viral copies this  assay can detect is 138 copies/mL. A negative result does not preclude SARS-Cov-2 infection and should not be used as the sole basis for treatment or other patient management decisions. A negative result may occur with  improper specimen collection/handling, submission of specimen other than nasopharyngeal swab, presence of viral mutation(s) within the areas targeted by this assay, and inadequate number of viral copies(<138 copies/mL). A negative result must be combined with clinical observations, patient history, and epidemiological information. The expected result is Negative.  Fact Sheet for Patients:  EntrepreneurPulse.com.au  Fact Sheet for Healthcare Providers:  IncredibleEmployment.be  This test is no t yet approved or cleared by the [place] FDA and  has been authorized for detection and/or diagnosis of SARS-CoV-2 by FDA under an Emergency Use Authorization (EUA). This EUA will remain  in effect (meaning this test can be used) for the duration of the COVID-19 declaration under Section 564(b)(1) of the Act, 21 U.S.C.section 360bbb-3(b)(1), unless the authorization is terminated  or revoked sooner.       Influenza A by PCR NEGATIVE NEGATIVE Final   Influenza B by PCR NEGATIVE NEGATIVE Final    Comment: (NOTE) The Xpert Xpress SARS-CoV-2/FLU/RSV plus assay is intended as an aid in the diagnosis of influenza from Nasopharyngeal swab specimens and should not be used as a sole basis for treatment. Nasal washings and aspirates are unacceptable for Xpert Xpress SARS-CoV-2/FLU/RSV testing.  Fact Sheet for Patients: EntrepreneurPulse.com.au  Fact Sheet for Healthcare Providers: IncredibleEmployment.be  This test is not yet approved or cleared by the [place] FDA and has been authorized for detection and/or diagnosis of SARS-CoV-2 by FDA under an Emergency Use Authorization (EUA). This EUA will  remain in effect (meaning this test can be used) for the duration of the COVID-19 declaration under Section 564(b)(1) of the Act, 21 U.S.C. section 360bbb-3(b)(1), unless the authorization is terminated or revoked.  Performed at [facility], [place] [address] [place], [place] [address]   MRSA PCR Screening     Status: None   Collection Time: 02/26/20  4:00 PM   Specimen: Nasopharyngeal  Result Value Ref Range Status   MRSA by PCR NEGATIVE NEGATIVE Final    Comment:        The GeneXpert MRSA Assay (FDA approved for NASAL specimens only), is one component of a comprehensive MRSA colonization surveillance program. It is not intended to diagnose MRSA infection nor to guide or monitor treatment for MRSA infections. Performed at [facility], [place] [address] [place], [place] [address]   Culture, Urine     Status: None   Collection Time: 02/26/20  5:08 PM   Specimen: Urine, Random  Result Value Ref Range Status   Specimen Description URINE, RANDOM  Final   Special Requests NONE  Final   Culture   Final    NO GROWTH Performed at [facility], [place] [address] [place], [place] [address]    Report Status 02/27/2020 FINAL  Final  Culture, blood (routine x 2)     Status: None (Preliminary result)   Collection Time: 02/26/20 10:43 PM   Specimen: BLOOD  Result Value Ref Range Status   Specimen Description BLOOD SITE NOT SPECIFIED  Final   Special Requests   Final    AEROBIC BOTTLE ONLY Blood Culture results may not be optimal due to an inadequate volume of blood received in culture bottles   Culture   Final    NO GROWTH 4 DAYS Performed at [facility], [place] [address], [place], [place] [address]    Report Status PENDING  Incomplete  Culture, blood (routine x 2)     Status: None (Preliminary result)   Collection Time: 02/26/20 10:43 PM   Specimen: BLOOD  Result Value Ref Range Status   Specimen Description BLOOD SITE NOT SPECIFIED  Final   Special Requests  AEROBIC BOTTLE ONLY Blood Culture adequate volume  Final   Culture   Final    NO GROWTH 4 DAYS Performed at [facility], [address], [place], [place] [address]    Report Status PENDING  Incomplete         Radiology Studies: DG Chest 2 View  Result Date: 03/01/2020 CLINICAL DATA:  Pneumonia. EXAM: CHEST - 2 VIEW COMPARISON:  02/29/2020 FINDINGS: Right jugular central venous catheter remains in appropriate position. Stable mild cardiomegaly. Diffuse interstitial infiltrates show no significant change. Persistent atelectasis or consolidation is seen in the left lower lobe. Probable small left pleural effusion again noted. IMPRESSION: Stable mild cardiomegaly and diffuse interstitial infiltrates. Stable left lower lobe atelectasis versus consolidation, and probable small left pleural effusion. Electronically Signed   By: Marlaine Hind M.D.   On: 03/01/2020 15:47   DG CHEST PORT 1 VIEW  Result Date: 02/29/2020 CLINICAL DATA:  Fever and chills. EXAM: PORTABLE CHEST 1 VIEW COMPARISON:  02/26/2020 FINDINGS: Right IJ central venous catheter has tip over the SVC and unchanged. Lungs are somewhat hypoinflated. The left base/retrocardiac region is difficult to evaluate due to prominent overlying soft tissues as cannot exclude interspace process or small effusion in the left base. Subtle hazy prominence of the perihilar vessels which may be due to  a degree of vascular congestion. Stable borderline cardiomegaly. Remainder of the exam is unchanged. IMPRESSION: 1. Suggestion of minimal vascular congestion. The left base is not well evaluated on this exam as airspace process or a small effusion is possible. Consider PA and lateral chest radiograph for better evaluation of the left base. 2. Right IJ central venous catheter unchanged. Electronically Signed   By: Marin Olp M.D.   On: 02/29/2020 10:56        Scheduled Meds: . B-complex with vitamin C  1 tablet Oral Daily  . chlorhexidine  gluconate (MEDLINE KIT)  15 mL Mouth Rinse BID  . Chlorhexidine Gluconate Cloth  6 each Topical Q0600  . Chlorhexidine Gluconate Cloth  6 each Topical Q0600  . docusate sodium  100 mg Oral BID  . feeding supplement  237 mL Oral TID BM  . levETIRAcetam  250 mg Oral Q T,Th,Sa-HD  . levETIRAcetam  500 mg Oral BID  . mouth rinse  15 mL Mouth Rinse BID  . NIFEdipine  90 mg Oral Daily  . sodium chloride flush  10-40 mL Intracatheter Q12H   Continuous Infusions: . sodium chloride Stopped (02/28/20 1701)  . cefTRIAXone (ROCEPHIN)  IV Stopped (03/01/20 1148)     LOS: 6 days    Time spent: 35 minutes.     Elmarie Shiley, MD [facility]   If 7PM-7AM, please contact night-coverage [email or web address]  03/02/2020, 7:58 AM

## 2020-03-02 NOTE — Progress Notes (Signed)
Patient ID: Barbara Crawford, female   DOB: [birth year], 65 y.o.   MRN: [id number] S: No complaints this morning O:BP (!) 154/85   Pulse (!) 103   Temp 98.1 F (36.7 C) (Oral)   Resp 18   Ht 5' 2.99" (1.6 m)   Wt 74.8 kg   SpO2 99%   BMI 29.22 kg/m   Intake/Output Summary (Last 24 hours) at 03/02/2020 0959 Last data filed at 03/01/2020 1800 Gross per 24 hour  Intake 344.77 ml  Output 100 ml  Net 244.77 ml   Intake/Output: I/O last 3 completed shifts: In: 344.8 [P.O.:120; IV Piggyback:224.8] Out: 300 [Urine:300]  Intake/Output this shift:  No intake/output data recorded. Weight change:  Gen: NAD CVS: tachy at 103 Resp: CTA Abd: +BS, soft, NT/ND Ext: no edema  Recent Labs  Lab 02/25/20 1753 02/25/20 1958 02/26/20 1457 02/26/20 1529 02/26/20 1701 02/27/20 0619 02/27/20 1600 02/28/20 0501 02/28/20 1556 02/29/20 0352 03/01/20 0050 03/02/20 0324  NA 130*   < > 134* 132*   < > 135 136 136 134* 130* 135 135  K 5.4*   < > 4.4 4.5   < > 4.1 4.2 5.0 4.4 4.6 4.0 4.3  CL 99   < > 96* 97*  --  101 102 100 100 98 97* 99  CO2 9*   < > 10* 10*  --  18* 22 24 24  21* 26 25  GLUCOSE 97   < > 150* 154*  --  87 110* 115* 101* 93 101* 91  BUN 144*   < > 143* 141*  --  87* 53* 36* 30* 35* 18 33*  CREATININE 29.98*   < > 30.13* 28.86*  --  17.40* 10.90* 6.62* 5.20* 6.17* 4.31* 6.37*  ALBUMIN 2.1*   < > 1.6* 1.6*  --  1.4* 1.7* 1.6* 1.6* 1.6* 1.4* 1.4*  CALCIUM 6.8*   < > 6.5* 6.1*  --  6.6* 7.4* 7.4* 7.2* 7.2* 8.4* 7.7*  PHOS  --    < >  --  >30.0*  --  7.7* 5.0* 3.6 3.4 3.1 3.7  --   AST 26  --  46*  --   --   --   --   --   --   --  103* 70*  ALT 35  --  38  --   --   --   --   --   --   --  54* 53*   < > = values in this interval not displayed.   Liver Function Tests: Recent Labs  Lab 02/26/20 1457 02/26/20 1529 02/29/20 0352 03/01/20 0050 03/02/20 0324  AST 46*  --   --  103* 70*  ALT 38  --   --  54* 53*  ALKPHOS 49  --   --  60 57  BILITOT 0.3  --   --  0.3 0.5   PROT 6.6  --   --  5.8* 5.7*  ALBUMIN 1.6*   < > 1.6* 1.4* 1.4*   < > = values in this interval not displayed.   Recent Labs  Lab 02/25/20 1753  LIPASE 71*   No results for input(s): AMMONIA in the last 168 hours. CBC: Recent Labs  Lab 02/25/20 1753 02/26/20 0305 02/26/20 1457 02/26/20 1701 02/27/20 0619 02/28/20 0501 02/29/20 0352 03/01/20 0050 03/02/20 0850  WBC 8.6   < > 11.0*  --  6.4 7.7 14.6* 15.3* 12.7*  NEUTROABS 7.0  --  7.4  --   --   --   --  13.1*  --   HGB 6.3*   < > 6.4*   < > 8.5* 8.1* 7.8* 7.8* 7.9*  HCT 19.9*   < > 20.2*   < > 23.8* 24.7* 23.3* 23.2* 25.0*  MCV 86.1   < > 85.6  --  81.0 85.2 86.0 86.2 88.0  PLT 282   < > 317  --  192 184 176 176 237   < > = values in this interval not displayed.   Cardiac Enzymes: No results for input(s): CKTOTAL, CKMB, CKMBINDEX, TROPONINI in the last 168 hours. CBG: Recent Labs  Lab 02/28/20 1921 02/28/20 2303 02/29/20 0308 02/29/20 0832 02/29/20 1114  GLUCAP 109* 87 84 97 115*    Iron Studies: No results for input(s): IRON, TIBC, TRANSFERRIN, FERRITIN in the last 72 hours. Studies/Results: DG Chest 2 View  Result Date: 03/01/2020 CLINICAL DATA:  Pneumonia. EXAM: CHEST - 2 VIEW COMPARISON:  02/29/2020 FINDINGS: Right jugular central venous catheter remains in appropriate position. Stable mild cardiomegaly. Diffuse interstitial infiltrates show no significant change. Persistent atelectasis or consolidation is seen in the left lower lobe. Probable small left pleural effusion again noted. IMPRESSION: Stable mild cardiomegaly and diffuse interstitial infiltrates. Stable left lower lobe atelectasis versus consolidation, and probable small left pleural effusion. Electronically Signed   By: Marlaine Hind M.D.   On: 03/01/2020 15:47   . B-complex with vitamin C  1 tablet Oral Daily  . chlorhexidine gluconate (MEDLINE KIT)  15 mL Mouth Rinse BID  . Chlorhexidine Gluconate Cloth  6 each Topical Q0600  . Chlorhexidine  Gluconate Cloth  6 each Topical Q0600  . docusate sodium  100 mg Oral BID  . feeding supplement  237 mL Oral TID BM  . levETIRAcetam  250 mg Oral Q T,Th,Sa-HD  . levETIRAcetam  500 mg Oral BID  . mouth rinse  15 mL Mouth Rinse BID  . NIFEdipine  90 mg Oral Daily  . sodium chloride flush  10-40 mL Intracatheter Q12H    BMET    Component Value Date/Time   NA 135 03/02/2020 0324   K 4.3 03/02/2020 0324   CL 99 03/02/2020 0324   CO2 25 03/02/2020 0324   GLUCOSE 91 03/02/2020 0324   BUN 33 (H) 03/02/2020 0324   CREATININE 6.37 (H) 03/02/2020 0324   CALCIUM 7.7 (L) 03/02/2020 0324   CALCIUM 6.2 (LL) 02/26/2020 0305   GFRNONAA 7 (L) 03/02/2020 0324   GFRAA >60 01/28/2016 1543   CBC    Component Value Date/Time   WBC 12.7 (H) 03/02/2020 0850   RBC 2.84 (L) 03/02/2020 0850   HGB 7.9 (L) 03/02/2020 0850   HCT 25.0 (L) 03/02/2020 0850   PLT 237 03/02/2020 0850   MCV 88.0 03/02/2020 0850   MCH 27.8 03/02/2020 0850   MCHC 31.6 03/02/2020 0850   RDW 15.9 (H) 03/02/2020 0850   LYMPHSABS 0.8 03/01/2020 0050   MONOABS 1.2 (H) 03/01/2020 0050   EOSABS 0.0 03/01/2020 0050   BASOSABS 0.1 03/01/2020 0050     Assessment/Plan:  1. 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  Appears was brewing in 12/2019 with PCP referring her to nephrology then.  Hep C neg, hep B s antigen neg.  nontunneled catheter on 02/25/20.  UA with > 300 mg/dl protein and 0-5 RBC. ANA positive.  ANCA negative.  Complement normal. RPR nonreactive. Anti-GBM neg. ASO normal. SPEP no M spike. Free  light chain ratio normal.  Started on CRRT on 2/24 after nontunneled catheter with IR. IHD on 02/29/20.   1. Plan for renal biopsy today and HD tomorrow (no heparin) 2. Will need permanent access given oliguria and ongoing need for HD. 2. PEA arrest after temp HD catheter placed.  Resolved and extubated. 3. HTN - ACEi on hold due to AKI 4. Anemia - improved s/p transfusion 5. Seizure activity - neurology  consulted and felt to be related to metabolic derangements.  On keppra per neuro. 6. CKD-MBD - stable 7. Nephrotic syndrome - for renal biopsy as above  Donetta Potts, MD [facility] [phone]

## 2020-03-02 NOTE — Procedures (Signed)
  Procedure: [place] [employer] renal biopsy   EBL:   minimal Complications:  none immediate  See full dictation in [facility].  Dillard Cannon MD Main # [phone] Pager  [phone] Mobile [phone]

## 2020-03-03 ENCOUNTER — Inpatient Hospital Stay (HOSPITAL_COMMUNITY): Payer: 59

## 2020-03-03 DIAGNOSIS — N179 Acute kidney failure, unspecified: Secondary | ICD-10-CM | POA: Diagnosis not present

## 2020-03-03 LAB — CBC
HCT: 20.2 % — ABNORMAL LOW (ref 36.0–46.0)
Hemoglobin: 6.3 g/dL — CL (ref 12.0–15.0)
MCH: 27.4 pg (ref 26.0–34.0)
MCHC: 31.2 g/dL (ref 30.0–36.0)
MCV: 87.8 fL (ref 80.0–100.0)
Platelets: 210 10*3/uL (ref 150–400)
RBC: 2.3 MIL/uL — ABNORMAL LOW (ref 3.87–5.11)
RDW: 15.4 % (ref 11.5–15.5)
WBC: 9.6 10*3/uL (ref 4.0–10.5)
nRBC: 0 % (ref 0.0–0.2)

## 2020-03-03 LAB — RENAL FUNCTION PANEL
Albumin: 1.3 g/dL — ABNORMAL LOW (ref 3.5–5.0)
Anion gap: 13 (ref 5–15)
BUN: 16 mg/dL (ref 8–23)
CO2: 24 mmol/L (ref 22–32)
Calcium: 8.3 mg/dL — ABNORMAL LOW (ref 8.9–10.3)
Chloride: 98 mmol/L (ref 98–111)
Creatinine, Ser: 3.9 mg/dL — ABNORMAL HIGH (ref 0.44–1.00)
GFR, Estimated: 12 mL/min — ABNORMAL LOW (ref >60)
Glucose, Bld: 87 mg/dL (ref 70–99)
Phosphorus: 3 mg/dL (ref 2.5–4.6)
Potassium: 4 mmol/L (ref 3.5–5.1)
Sodium: 135 mmol/L (ref 135–145)

## 2020-03-03 LAB — HEMOGLOBIN AND HEMATOCRIT, BLOOD
HCT: 25.9 % — ABNORMAL LOW (ref 36.0–46.0)
Hemoglobin: 8.7 g/dL — ABNORMAL LOW (ref 12.0–15.0)

## 2020-03-03 LAB — PREPARE RBC (CROSSMATCH)

## 2020-03-03 IMAGING — CT CT ABD-PELV W/O CM
2 of 4 series · 16 of 46 positions shown, 18 images · non-contrast
Comparison: [DATE]

CLINICAL DATA: Anemia, history of recent left renal biopsy

EXAM:
CT ABDOMEN AND PELVIS WITHOUT CONTRAST
TECHNIQUE: Multidetector CT imaging of the abdomen and pelvis was performed
following the standard protocol without IV contrast.

[Series 3: ap without · axial · non-contrast · 0.87mm/px · z∈[+967,+1307]mm · 13 of 78 slices shown, 15 images]
[im 5/78  soft-tissue]
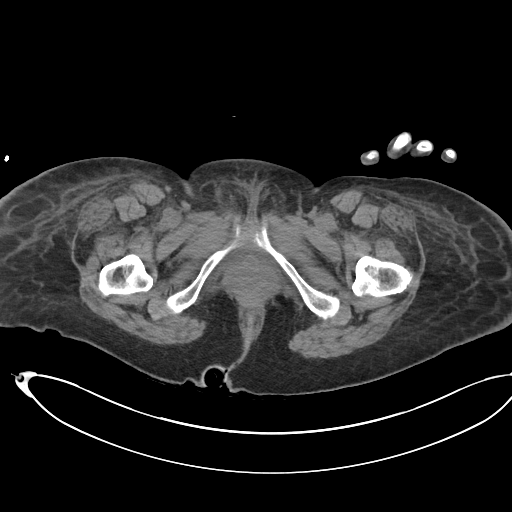
[im 5/78  bone]
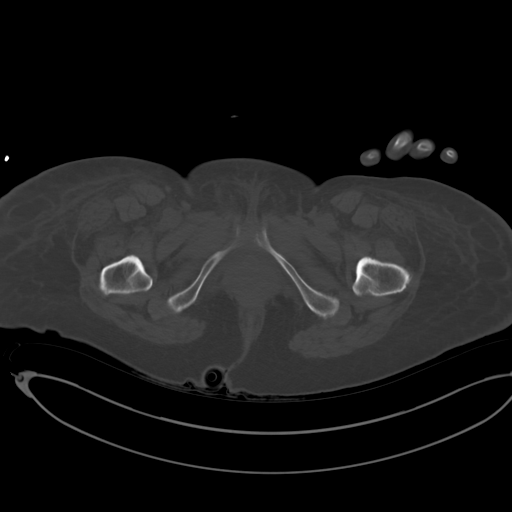
[im 13/78  soft-tissue]
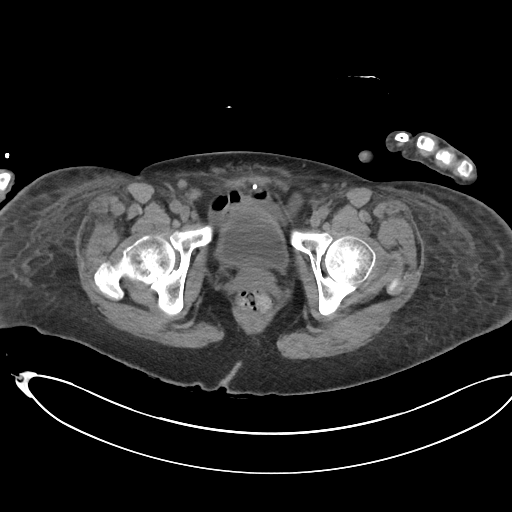
[im 17/78  soft-tissue]
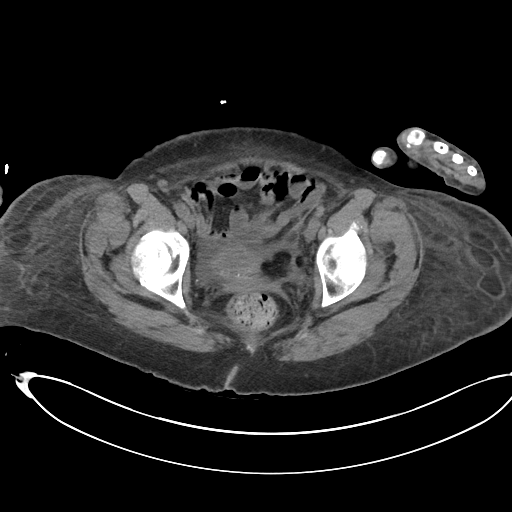
[im 21/78  soft-tissue]
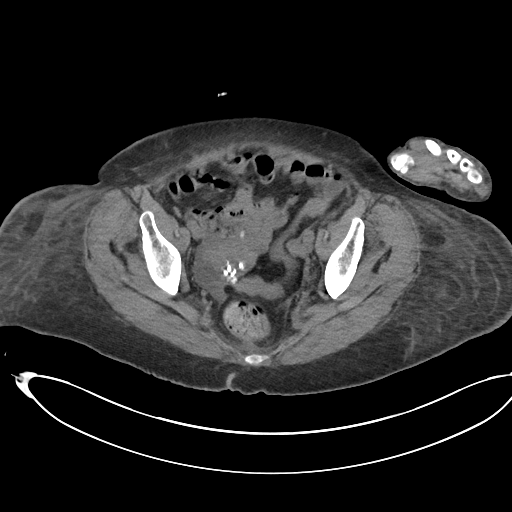
[im 29/78  soft-tissue]
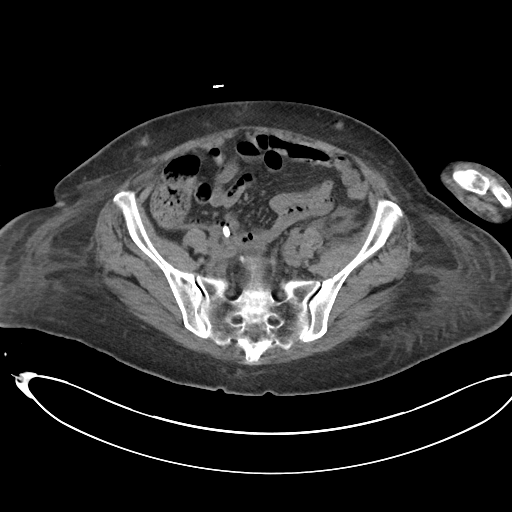
[im 33/78  soft-tissue]
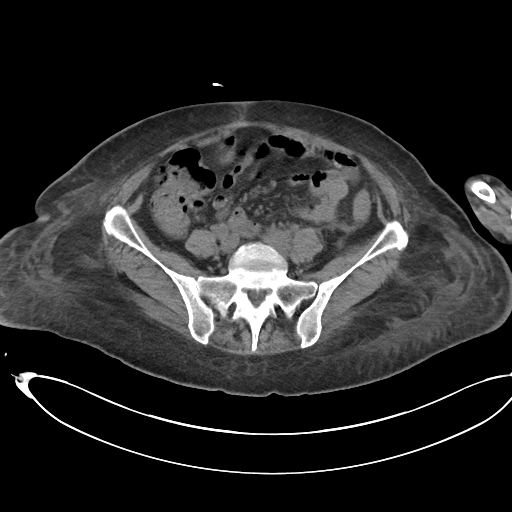
[im 41/78  soft-tissue]
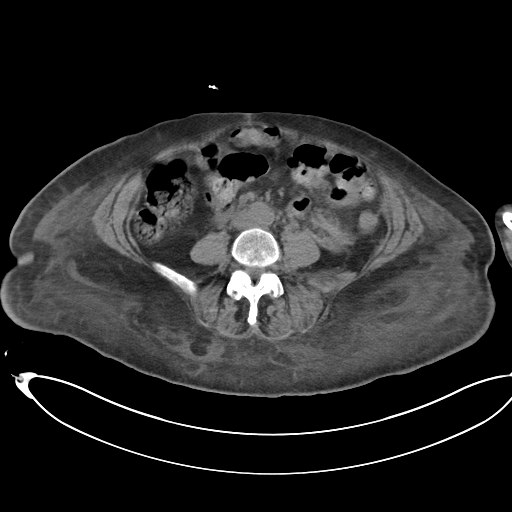
[im 45/78  soft-tissue]
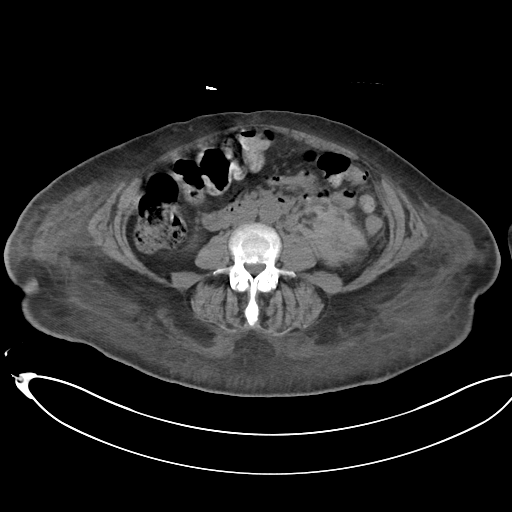
[im 49/78  soft-tissue]
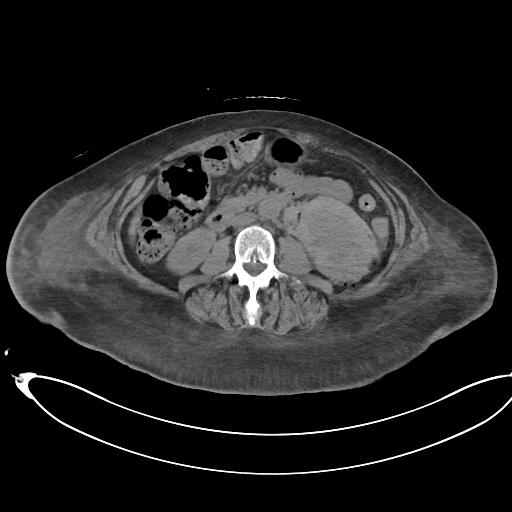
[im 49/78  bone]
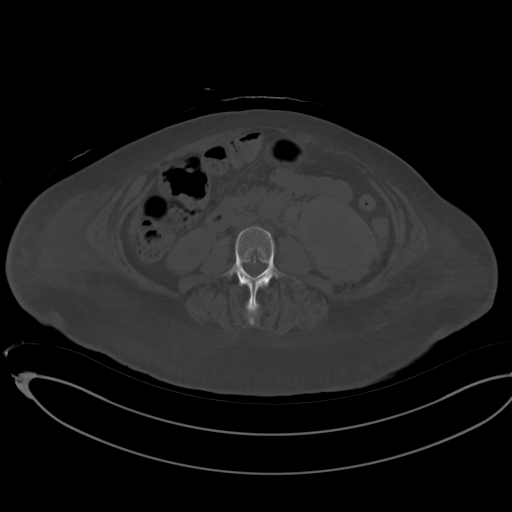
[im 57/78  soft-tissue]
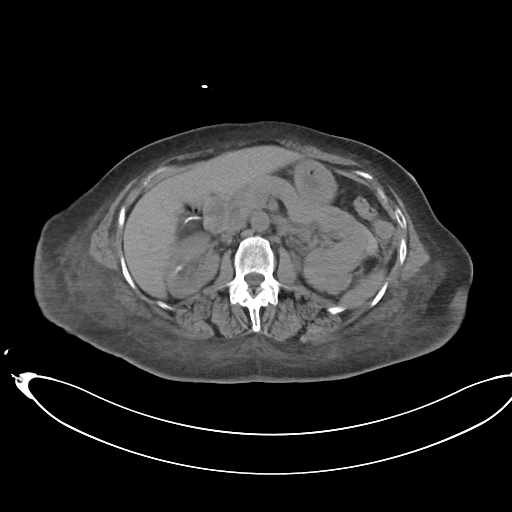
[im 61/78  soft-tissue]
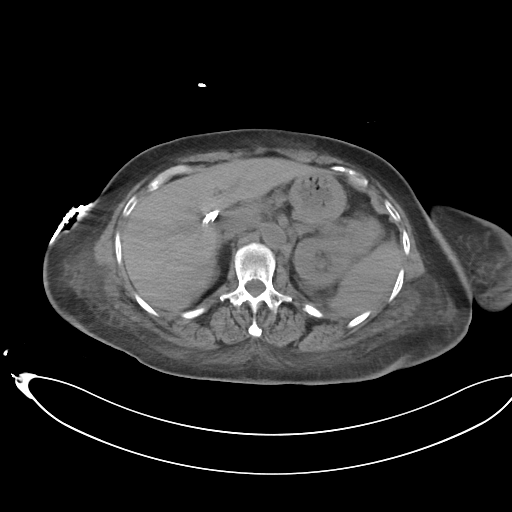
[im 65/78  soft-tissue]
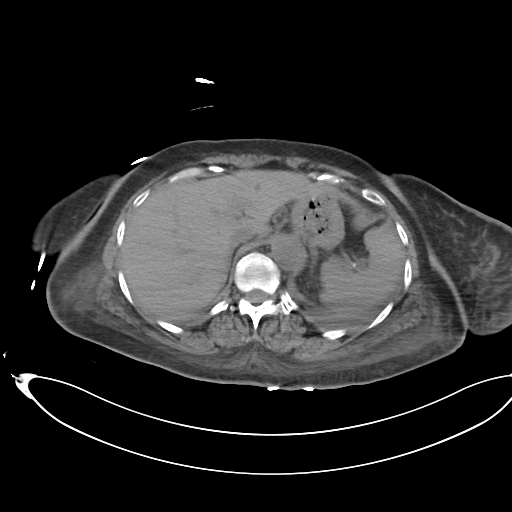
[im 73/78  soft-tissue]
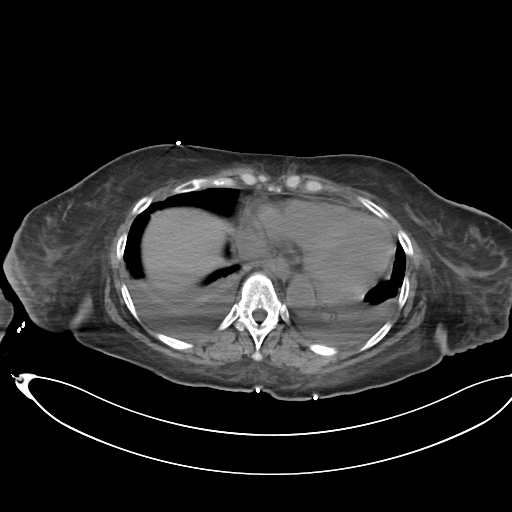

[Series 6: cor · coronal · 0.78mm/px · 3 of 77 slices shown]
[im 26/77  soft-tissue]
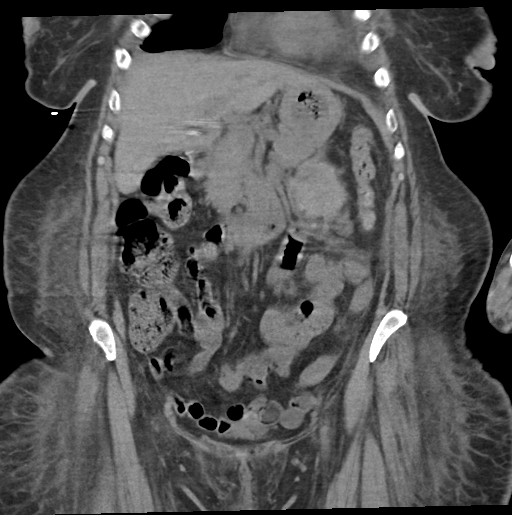
[im 34/77  soft-tissue]
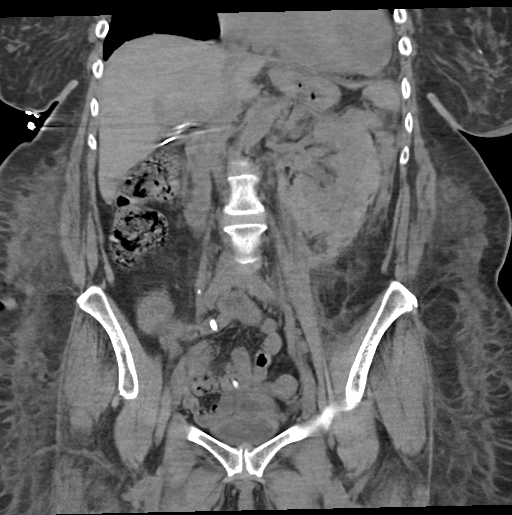
[im 43/77  soft-tissue]
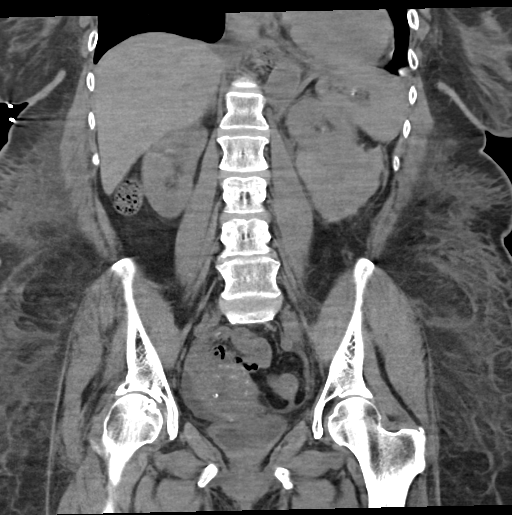

[16 of 46 positions shown; findings below may reference images not displayed]

FINDINGS: Lower chest: New bilateral pleural effusions and bibasilar
atelectasis are noted when compared with the prior exam. No
pneumothorax is seen. Cardiac blood pool is decreased in attenuation
consistent with the underlying history of anemia.

Hepatobiliary: No focal liver abnormality is seen. Status post
cholecystectomy. No biliary dilatation.

Pancreas: Unremarkable. No pancreatic ductal dilatation or
surrounding inflammatory changes.

Spleen: Normal in size without focal abnormality.

Adrenals/Urinary Tract: Adrenal glands are within normal limits.
Right kidney demonstrates a nonobstructing stone in the midportion
measuring 8 mm. Left kidney demonstrates no obstructive change
although hyperdense material is noted surrounding the lower pole of
the left kidney and extending superiorly consistent with perinephric
hemorrhage related to the recent biopsy. No significant impingement
upon the native kidney is noted. The bladder is decompressed.

Stomach/Bowel: The appendix is not well visualized. No inflammatory
changes to suggest appendicitis are noted. Colon shows no
obstructive or inflammatory changes. Small bowel and stomach appear
within normal limits.

Vascular/Lymphatic: Aortic atherosclerosis. No enlarged abdominal or
pelvic lymph nodes.

Reproductive: Multiple calcified uterine fibroids are noted.

Other: Minimal free fluid is noted in the pelvis. Changes of
anasarca are noted.

Musculoskeletal: Lumbar spine degenerative changes are noted without
acute abnormality.
IMPRESSION: Changes consistent with the recent renal biopsy with perinephric
hematoma identified. No significant impingement upon the underlying
native kidney is noted. No obstructive changes are seen.

Nonobstructing midpole right renal stone stable from the prior exam.

New bilateral effusions and lower lobe atelectasis.

Mild changes of anasarca

## 2020-03-03 MED ORDER — SODIUM CHLORIDE 0.9% IV SOLUTION: Freq: Once | INTRAVENOUS | Status: AC

## 2020-03-03 NOTE — Progress Notes (Signed)
Patient ID: Barbara Crawford, female   DOB: Sep 06, 1955, 65 y.o.   MRN: [id number] S: No complaints and tolerated HD well.  Hgb dropped to 6.3 today but no complaints of flank or abdominal pain. O:BP (!) 149/89 (BP Location: Right Arm)   Pulse (!) 118   Temp 98.8 F (37.1 C) (Oral)   Resp 20   Ht 5' 2.99" (1.6 m)   Wt 74.8 kg   SpO2 94%   BMI 29.22 kg/m  No intake or output data in the 24 hours ending 03/03/20 1212 Intake/Output: No intake/output data recorded.  Intake/Output this shift:  No intake/output data recorded. Weight change:  Gen: NAD CVS: tachy, no rub Resp: cta Abd: +BS, soft, NT/ND Ext: no edema  Recent Labs  Lab 02/25/20 1753 02/25/20 1958 02/26/20 1457 02/26/20 1529 02/27/20 0619 02/27/20 1600 02/28/20 0501 02/28/20 1556 02/29/20 0352 03/01/20 0050 03/02/20 0324 03/03/20 0543  NA 130*   < > 134*   < > 135 136 136 134* 130* 135 135 135  K 5.4*   < > 4.4   < > 4.1 4.2 5.0 4.4 4.6 4.0 4.3 4.0  CL 99   < > 96*   < > 101 102 100 100 98 97* 99 98  CO2 9*   < > 10*   < > 18* 22 24 24  21* 26 25 24   GLUCOSE 97   < > 150*   < > 87 110* 115* 101* 93 101* 91 87  BUN 144*   < > 143*   < > 87* 53* 36* 30* 35* 18 33* 16  CREATININE 29.98*   < > 30.13*   < > 17.40* 10.90* 6.62* 5.20* 6.17* 4.31* 6.37* 3.90*  ALBUMIN 2.1*   < > 1.6*   < > 1.4* 1.7* 1.6* 1.6* 1.6* 1.4* 1.4* 1.3*  CALCIUM 6.8*   < > 6.5*   < > 6.6* 7.4* 7.4* 7.2* 7.2* 8.4* 7.7* 8.3*  PHOS  --    < >  --    < > 7.7* 5.0* 3.6 3.4 3.1 3.7  --  3.0  AST 26  --  46*  --   --   --   --   --   --  103* 70*  --   ALT 35  --  38  --   --   --   --   --   --  54* 53*  --    < > = values in this interval not displayed.   Liver Function Tests: Recent Labs  Lab 02/26/20 1457 02/26/20 1529 03/01/20 0050 03/02/20 0324 03/03/20 0543  AST 46*  --  103* 70*  --   ALT 38  --  54* 53*  --   ALKPHOS 49  --  60 57  --   BILITOT 0.3  --  0.3 0.5  --   PROT 6.6  --  5.8* 5.7*  --   ALBUMIN 1.6*   < > 1.4* 1.4*  1.3*   < > = values in this interval not displayed.   Recent Labs  Lab 02/25/20 1753  LIPASE 71*   No results for input(s): AMMONIA in the last 168 hours. CBC: Recent Labs  Lab 02/25/20 1753 02/26/20 0305 02/26/20 1457 02/26/20 1701 02/28/20 0501 02/29/20 0352 03/01/20 0050 03/02/20 0850 03/03/20 0726  WBC 8.6   < > 11.0*   < > 7.7 14.6* 15.3* 12.7* 9.6  NEUTROABS 7.0  --  7.4  --   --   --  13.1*  --   --   HGB 6.3*   < > 6.4*   < > 8.1* 7.8* 7.8* 7.9* 6.3*  HCT 19.9*   < > 20.2*   < > 24.7* 23.3* 23.2* 25.0* 20.2*  MCV 86.1   < > 85.6   < > 85.2 86.0 86.2 88.0 87.8  PLT 282   < > 317   < > 184 176 176 237 210   < > = values in this interval not displayed.   Cardiac Enzymes: No results for input(s): CKTOTAL, CKMB, CKMBINDEX, TROPONINI in the last 168 hours. CBG: Recent Labs  Lab 02/28/20 2303 02/29/20 0308 02/29/20 0832 02/29/20 1114 03/02/20 1028  GLUCAP 87 84 97 115* 82    Iron Studies: No results for input(s): IRON, TIBC, TRANSFERRIN, FERRITIN in the last 72 hours. Studies/Results: CT ABDOMEN PELVIS WO CONTRAST  Result Date: 03/03/2020 CLINICAL DATA:  Anemia, history of recent left renal biopsy EXAM: CT ABDOMEN AND PELVIS WITHOUT CONTRAST TECHNIQUE: Multidetector CT imaging of the abdomen and pelvis was performed following the standard protocol without IV contrast. COMPARISON:  02/25/2020 FINDINGS: Lower chest: New bilateral pleural effusions and bibasilar atelectasis are noted when compared with the prior exam. No pneumothorax is seen. Cardiac blood pool is decreased in attenuation consistent with the underlying history of anemia. Hepatobiliary: No focal liver abnormality is seen. Status post cholecystectomy. No biliary dilatation. Pancreas: Unremarkable. No pancreatic ductal dilatation or surrounding inflammatory changes. Spleen: Normal in size without focal abnormality. Adrenals/Urinary Tract: Adrenal glands are within normal limits. Right kidney demonstrates a  nonobstructing stone in the midportion measuring 8 mm. Left kidney demonstrates no obstructive change although hyperdense material is noted surrounding the lower pole of the left kidney and extending superiorly consistent with perinephric hemorrhage related to the recent biopsy. No significant impingement upon the native kidney is noted. The bladder is decompressed. Stomach/Bowel: The appendix is not well visualized. No inflammatory changes to suggest appendicitis are noted. Colon shows no obstructive or inflammatory changes. Small bowel and stomach appear within normal limits. Vascular/Lymphatic: Aortic atherosclerosis. No enlarged abdominal or pelvic lymph nodes. Reproductive: Multiple calcified uterine fibroids are noted. Other: Minimal free fluid is noted in the pelvis. Changes of anasarca are noted. Musculoskeletal: Lumbar spine degenerative changes are noted without acute abnormality. IMPRESSION: Changes consistent with the recent renal biopsy with perinephric hematoma identified. No significant impingement upon the underlying native kidney is noted. No obstructive changes are seen. Nonobstructing midpole right renal stone stable from the prior exam. New bilateral effusions and lower lobe atelectasis. Mild changes of anasarca Electronically Signed   By: Inez Catalina M.D.   On: 03/03/2020 10:50   DG Chest 2 View  Result Date: 03/01/2020 CLINICAL DATA:  Pneumonia. EXAM: CHEST - 2 VIEW COMPARISON:  02/29/2020 FINDINGS: Right jugular central venous catheter remains in appropriate position. Stable mild cardiomegaly. Diffuse interstitial infiltrates show no significant change. Persistent atelectasis or consolidation is seen in the left lower lobe. Probable small left pleural effusion again noted. IMPRESSION: Stable mild cardiomegaly and diffuse interstitial infiltrates. Stable left lower lobe atelectasis versus consolidation, and probable small left pleural effusion. Electronically Signed   By: Marlaine Hind M.D.    On: 03/01/2020 15:47   US BIOPSY (KIDNEY)  Result Date: 03/02/2020 CLINICAL DATA:  Renal insufficiency, acute kidney injury, hypertension EXAM: ULTRASOUND GUIDED RENAL CORE BIOPSY COMPARISON:  CT 02/25/2020 TECHNIQUE: Survey ultrasound was performed and an appropriate skin entry site was localized. Markedly echogenic renal parenchyma noted bilaterally. Site was  marked, prepped with Betadine, draped in usual sterile fashion, infiltrated locally with 1% lidocaine. Intravenous Fentanyl 21mg and Versed 181mwere administered as conscious sedation during continuous monitoring of the patient's level of consciousness and physiological / cardiorespiratory status by the radiology RN, with a total moderate sedation time of 12 minutes. Under real time ultrasound guidance, a 15 gauge trocar needle was advanced to the margin of the lower pole of the left kidney for 3 coaxial 16 gauge core biopsy needle passes. The core samples were submitted to pathology. The patient tolerated procedure well. COMPLICATIONS: None immediate IMPRESSION: 1. Technically successful ultrasound-guided core renal biopsy , left lower pole. Electronically Signed   By: D Lucrezia Europe.D.   On: 03/02/2020 15:45   . sodium chloride   Intravenous Once  . B-complex with vitamin C  1 tablet Oral Daily  . chlorhexidine gluconate (MEDLINE KIT)  15 mL Mouth Rinse BID  . Chlorhexidine Gluconate Cloth  6 each Topical Q0600  . Chlorhexidine Gluconate Cloth  6 each Topical Q0600  . docusate sodium  100 mg Oral BID  . feeding supplement  237 mL Oral TID BM  . levETIRAcetam  250 mg Oral Q T,Th,Sa-HD  . levETIRAcetam  500 mg Oral BID  . mouth rinse  15 mL Mouth Rinse BID  . NIFEdipine  90 mg Oral Daily  . sodium chloride flush  10-40 mL Intracatheter Q12H    BMET    Component Value Date/Time   NA 135 03/03/2020 0543   K 4.0 03/03/2020 0543   CL 98 03/03/2020 0543   CO2 24 03/03/2020 0543   GLUCOSE 87 03/03/2020 0543   BUN 16 03/03/2020 0543    CREATININE 3.90 (H) 03/03/2020 0543   CALCIUM 8.3 (L) 03/03/2020 0543   CALCIUM 6.2 (LL) 02/26/2020 0305   GFRNONAA 12 (L) 03/03/2020 0543   GFRAA >60 01/28/2016 1543   CBC    Component Value Date/Time   WBC 9.6 03/03/2020 0726   RBC 2.30 (L) 03/03/2020 0726   HGB 6.3 (LL) 03/03/2020 0726   HCT 20.2 (L) 03/03/2020 0726   PLT 210 03/03/2020 0726   MCV 87.8 03/03/2020 0726   MCH 27.4 03/03/2020 0726   MCHC 31.2 03/03/2020 0726   RDW 15.4 03/03/2020 0726   LYMPHSABS 0.8 03/01/2020 0050   MONOABS 1.2 (H) 03/01/2020 0050   EOSABS 0.0 03/01/2020 0050   BASOSABS 0.1 03/01/2020 0050     Assessment/Plan:  1. 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 Appears was brewing in 12/2019 with PCP referring her to nephrology then, Cr was 1.8 at that time. Hep C neg, hep B s antigen neg. nontunneled catheter on 02/25/20. UA with >300 mg/dl protein and 0-5 RBC. ANA positive.ANCA negative. Complement normal. RPR nonreactive. Anti-GBM neg. ASO normal. SPEP no M spike. Free light chain ratio normal. Started on CRRT on 2/24 after nontunneled catheter with IR. IHD on 02/29/20.   1. S/p renal biopsy 03/02/20 2. S/p 1st HD early this morning and plan for 2nd tomorrow (no heparin) 3. Will need permanent access given oliguria and ongoing need for HD. 2. PEA arrest after temp HD catheter placed.  Resolved and extubated. 3. HTN - ACEi on hold due to AKI 4. Vascular access- will need TDC and avf/avg placement this hospitalization if renal function does not recover soon. 1. S/p temp HD cath on 02/25/20 2. Will consult [facility] 5. Anemia - improved s/p transfusion 6. Seizure activity - neurology consulted and felt to be related  to metabolic derangements.  On keppra per neuro. 7. CKD-MBD - stable 8. Nephrotic syndrome - for renal biopsy as above   Donetta Potts, MD [facility] [phone]

## 2020-03-03 NOTE — Plan of Care (Signed)
  Problem: Clinical Measurements: Goal: Will remain free from infection Outcome: Progressing   Problem: Clinical Measurements: Goal: Ability to maintain clinical measurements within normal limits will improve Outcome: Progressing Goal: Will remain free from infection Outcome: Progressing Goal: Diagnostic test results will improve Outcome: Progressing Goal: Respiratory complications will improve Outcome: Progressing Goal: Cardiovascular complication will be avoided Outcome: Progressing   Problem: Activity: Goal: Risk for activity intolerance will decrease Outcome: Progressing   Problem: Nutrition: Goal: Adequate nutrition will be maintained Outcome: Progressing   Problem: Coping: Goal: Level of anxiety will decrease Outcome: Progressing   Problem: Elimination: Goal: Will not experience complications related to bowel motility Outcome: Progressing Goal: Will not experience complications related to urinary retention Outcome: Progressing   Problem: Pain Managment: Goal: General experience of comfort will improve Outcome: Progressing   Problem: Safety: Goal: Ability to remain free from injury will improve Outcome: Progressing   Problem: Skin Integrity: Goal: Risk for impaired skin integrity will decrease Outcome: Progressing   Problem: Education: Goal: Knowledge of disease and its progression will improve Outcome: Progressing   Problem: Health Behavior/Discharge Planning: Goal: Ability to manage health-related needs will improve Outcome: Progressing   Problem: Clinical Measurements: Goal: Complications related to the disease process or treatment will be avoided or minimized Outcome: Progressing Goal: Dialysis access will remain free of complications Outcome: Progressing   Problem: Activity: Goal: Activity intolerance will improve Outcome: Progressing   Problem: Fluid Volume: Goal: Fluid volume balance will be maintained or improved Outcome: Progressing    Problem: Nutritional: Goal: Ability to make appropriate dietary choices will improve Outcome: Progressing   Problem: Respiratory: Goal: Respiratory symptoms related to disease process will be avoided Outcome: Progressing   Problem: Self-Concept: Goal: Body image disturbance will be avoided or minimized Outcome: Progressing   Problem: Urinary Elimination: Goal: Progression of disease will be identified and treated Outcome: Progressing   Problem: Activity: Goal: Ability to tolerate increased activity will improve Outcome: Progressing   Problem: Respiratory: Goal: Ability to maintain a clear airway and adequate ventilation will improve Outcome: Progressing   Problem: Role Relationship: Goal: Method of communication will improve Outcome: Progressing

## 2020-03-03 NOTE — Progress Notes (Signed)
PROGRESS NOTE    Barbara Crawford  IWP:[id number] DOB: 1955/12/21 DOA: 02/25/2020 PCP: Vonna Drafts, FNP   Brief Narrative: 65 year old with past medical history significant for hypertension, who had Covid in December, at that time she was told by her PCP that she had a slight decrease in kidney function.  Patient noted to have increased lower extremity edema and left flank pain.  Work-up in the ED showed creatinine of 29, BUN 44.  Patient was admitted for further work-up.  Patient had temporary hemodialysis catheter placed by IR and afterward had PEA arrest associated seizure-like activity.  She was transferred to ICU, intubated and then subsequently extubated 12/27/2020.  Patient was evaluated by nephrology, was found to have nephrotic range proteinuria.  Patient underwent renal biopsy 12/30/2020.  TR[facility] assumed patient care on 03/01/2020.    Assessment & Plan:   Principal Problem:   Acute kidney failure (HCC) Active Problems:   Anemia   Uremia   HTN (hypertension)   Ventilator dependence ([place])   1-Acute Kidney Injury: -Patient presented with elevation of BUN and creatinine unclear etiology.  Nephrotic range proteinuria. -ANA positive, protein electrophoresis not M spike detected, ASO negative, C 3; 117, C 4; 35, ANCA negative, GBM antibody negative.  Strand DNA negative, SCL 70+, SS a IgG more than 8+, chromatin antibody positive. -Underwent  kidney biopsy 2/28. Results pending.  -Patient was started on hemodialysis, she had hemodialysis catheter placed by IR. -appreciate nephrology assistance. Patient had HD last night.   2-Acute Hypoxic Respiratory Failure, secondary to cardiac arrest, patient was intubated. Patient was subsequently extubated 12/27/2020 Currently on RA.   3-Seizure: Patient had seizure and loss of pulse status post CPR. Patient was evaluated by neurology who restarted patient on Keppra.  Plan is to discharge patient on Keppra 500 mg twice  daily.  4- Acute blood loss anemia; likely secondary to perinephric hematoma post biopsy./ . Normocytic anemia: Started on Aranesp, per nephrology.  Hb drop to 6 today. Had kidney bx 2/28. CT abdomen pelvis ordered, which showed: Changes consistent with recent renal biopsy with perinephric hematoma identified.  No significant impingement  upon the underlying native kidney is noted. IR aware.  Getting one unit of PRBC. Repeat hb post transfusion, might need second unit.  Check Hb every 8 hours.   5-UTI: Patient had fever, UA with more than 50 WBC>  On IV ceftriaxone.  Urine culture no growth.    7-PNA, left lower lobe infiltrate: Stable chest x-ray 2/27. Continue with ceftriaxone. sputum culture ordered.  WBC trending down.   8-Transaminases; trending down.  Hypertension: Continue with nifedipine and labetalol as needed  Nutrition Problem: Increased nutrient needs Etiology: acute illness (renal failure requiring CRRT)    Signs/Symptoms: estimated needs    Interventions: Ensure Enlive (each supplement provides 350kcal and 20 grams of protein),MVI,Liberalize Diet  Estimated body mass index is 29.22 kg/m as calculated from the following:   Height as of this encounter: 5' 2.99" (1.6 m).   Weight as of this encounter: 74.8 kg.   DVT prophylaxis: Hold prophylaxis Post renal biopsy Code Status: Full code Family Communication: Care discussed with patient Disposition Plan:  Status is: Inpatient  Remains inpatient appropriate because:IV treatments appropriate due to intensity of illness or inability to take PO   Dispo: The patient is from: Home              Anticipated d/c is to: to be determine  Patient currently is not medically stable to d/c.   Difficult to place patient No        Consultants:   Nephrology  CCM  IR  Procedures:   Kidney Bx 2/28  Antimicrobials:    Subjective: She denies abdominal pain or flank pain.  She denies  worsening SOB or weakness.   Objective: Vitals:   03/03/20 1100 03/03/20 1330 03/03/20 1331 03/03/20 1348  BP: (!) 149/89 (!) 156/86 (!) 156/86 (!) 151/95  Pulse: (!) 118 (!) 113 (!) 112 (!) 110  Resp: 20 20  18   Temp: 98.8 F (37.1 C) 99.5 F (37.5 C)  98.9 F (37.2 C)  TempSrc: Oral Oral    SpO2: 94% 98%    Weight:      Height:       No intake or output data in the 24 hours ending 03/03/20 1522 Filed Weights   02/29/20 0418 02/29/20 1315 02/29/20 1624  Weight: 74 kg 75.3 kg 74.8 kg    Examination:  General exam: NAD Respiratory system: B/L crackles.  Cardiovascular system: S 1, S 2 RRR Gastrointestinal system: BS present, soft, nt Central nervous system: alert Extremities: no edema    Data Reviewed: I have personally reviewed following labs and imaging studies  CBC: Recent Labs  Lab 02/25/20 1753 02/26/20 0305 02/26/20 1457 02/26/20 1701 02/28/20 0501 02/29/20 0352 03/01/20 0050 03/02/20 0850 03/03/20 0726  WBC 8.6   < > 11.0*   < > 7.7 14.6* 15.3* 12.7* 9.6  NEUTROABS 7.0  --  7.4  --   --   --  13.1*  --   --   HGB 6.3*   < > 6.4*   < > 8.1* 7.8* 7.8* 7.9* 6.3*  HCT 19.9*   < > 20.2*   < > 24.7* 23.3* 23.2* 25.0* 20.2*  MCV 86.1   < > 85.6   < > 85.2 86.0 86.2 88.0 87.8  PLT 282   < > 317   < > 184 176 176 237 210   < > = values in this interval not displayed.   Basic Metabolic Panel: Recent Labs  Lab 02/27/20 0619 02/27/20 1600 02/28/20 0501 02/28/20 1556 02/29/20 0352 03/01/20 0050 03/02/20 0324 03/03/20 0543  NA 135   < > 136 134* 130* 135 135 135  K 4.1   < > 5.0 4.4 4.6 4.0 4.3 4.0  CL 101   < > 100 100 98 97* 99 98  CO2 18*   < > 24 24 21* 26 25 24   GLUCOSE 87   < > 115* 101* 93 101* 91 87  BUN 87*   < > 36* 30* 35* 18 33* 16  CREATININE 17.40*   < > 6.62* 5.20* 6.17* 4.31* 6.37* 3.90*  CALCIUM 6.6*   < > 7.4* 7.2* 7.2* 8.4* 7.7* 8.3*  MG 1.7  --  2.3  --  2.1  --   --   --   PHOS 7.7*   < > 3.6 3.4 3.1 3.7  --  3.0   < > = values  in this interval not displayed.   GFR: Estimated Creatinine Clearance: 14.3 mL/min (A) (by C-G formula based on SCr of 3.9 mg/dL (H)). Liver Function Tests: Recent Labs  Lab 02/25/20 1753 02/26/20 0305 02/26/20 1457 02/26/20 1529 02/28/20 1556 02/29/20 0352 03/01/20 0050 03/02/20 0324 03/03/20 0543  AST 26  --  46*  --   --   --  103* 70*  --  ALT 35  --  38  --   --   --  54* 53*  --   ALKPHOS 50  --  49  --   --   --  60 57  --   BILITOT 0.5  --  0.3  --   --   --  0.3 0.5  --   PROT 7.8  --  6.6  --   --   --  5.8* 5.7*  --   ALBUMIN 2.1*   < > 1.6*   < > 1.6* 1.6* 1.4* 1.4* 1.3*   < > = values in this interval not displayed.   Recent Labs  Lab 02/25/20 1753  LIPASE 71*   No results for input(s): AMMONIA in the last 168 hours. Coagulation Profile: Recent Labs  Lab 02/26/20 2110  INR 1.0   Cardiac Enzymes: No results for input(s): CKTOTAL, CKMB, CKMBINDEX, TROPONINI in the last 168 hours. BNP (last 3 results) No results for input(s): PROBNP in the last 8760 hours. HbA1C: No results for input(s): HGBA1C in the last 72 hours. CBG: Recent Labs  Lab 02/28/20 2303 02/29/20 0308 02/29/20 0832 02/29/20 1114 03/02/20 1028  GLUCAP 87 84 97 115* 82   Lipid Profile: No results for input(s): CHOL, HDL, LDLCALC, TRIG, CHOLHDL, LDLDIRECT in the last 72 hours. Thyroid Function Tests: No results for input(s): TSH, T4TOTAL, FREET4, T3FREE, THYROIDAB in the last 72 hours. Anemia Panel: No results for input(s): VITAMINB12, FOLATE, FERRITIN, TIBC, IRON, RETICCTPCT in the last 72 hours. Sepsis Labs: No results for input(s): PROCALCITON, LATICACIDVEN in the last 168 hours.  Recent Results (from the past 240 hour(s))  Resp Panel by RT-PCR (Flu A&B, Covid) Nasopharyngeal Swab     Status: None   Collection Time: 02/25/20  6:12 PM   Specimen: Nasopharyngeal Swab; Nasopharyngeal(NP) swabs in vial transport medium  Result Value Ref Range Status   SARS Coronavirus 2 by RT PCR  NEGATIVE NEGATIVE Final    Comment: (NOTE) SARS-CoV-2 target nucleic acids are NOT DETECTED.  The SARS-CoV-2 RNA is generally detectable in upper respiratory specimens during the acute phase of infection. The lowest concentration of SARS-CoV-2 viral copies this assay can detect is 138 copies/mL. A negative result does not preclude SARS-Cov-2 infection and should not be used as the sole basis for treatment or other patient management decisions. A negative result may occur with  improper specimen collection/handling, submission of specimen other than nasopharyngeal swab, presence of viral mutation(s) within the areas targeted by this assay, and inadequate number of viral copies(<138 copies/mL). A negative result must be combined with clinical observations, patient history, and epidemiological information. The expected result is Negative.  Fact Sheet for Patients:  EntrepreneurPulse.com.au  Fact Sheet for Healthcare Providers:  IncredibleEmployment.be  This test is no t yet approved or cleared by the [place] FDA and  has been authorized for detection and/or diagnosis of SARS-CoV-2 by FDA under an Emergency Use Authorization (EUA). This EUA will remain  in effect (meaning this test can be used) for the duration of the COVID-19 declaration under Section 564(b)(1) of the Act, 21 U.S.C.section 360bbb-3(b)(1), unless the authorization is terminated  or revoked sooner.       Influenza A by PCR NEGATIVE NEGATIVE Final   Influenza B by PCR NEGATIVE NEGATIVE Final    Comment: (NOTE) The Xpert Xpress SARS-CoV-2/FLU/RSV plus assay is intended as an aid in the diagnosis of influenza from Nasopharyngeal swab specimens and should not be used as a sole basis  for treatment. Nasal washings and aspirates are unacceptable for Xpert Xpress SARS-CoV-2/FLU/RSV testing.  Fact Sheet for Patients: EntrepreneurPulse.com.au  Fact Sheet for  Healthcare Providers: IncredibleEmployment.be  This test is not yet approved or cleared by the [place] FDA and has been authorized for detection and/or diagnosis of SARS-CoV-2 by FDA under an Emergency Use Authorization (EUA). This EUA will remain in effect (meaning this test can be used) for the duration of the COVID-19 declaration under Section 564(b)(1) of the Act, 21 U.S.C. section 360bbb-3(b)(1), unless the authorization is terminated or revoked.  Performed at [facility], [place] [address], [place], [place] [address]   MRSA PCR Screening     Status: None   Collection Time: 02/26/20  4:00 PM   Specimen: Nasopharyngeal  Result Value Ref Range Status   MRSA by PCR NEGATIVE NEGATIVE Final    Comment:        The GeneXpert MRSA Assay (FDA approved for NASAL specimens only), is one component of a comprehensive MRSA colonization surveillance program. It is not intended to diagnose MRSA infection nor to guide or monitor treatment for MRSA infections. Performed at [facility], [place] [address] [place], [place] [address]   Culture, Urine     Status: None   Collection Time: 02/26/20  5:08 PM   Specimen: Urine, Random  Result Value Ref Range Status   Specimen Description URINE, RANDOM  Final   Special Requests NONE  Final   Culture   Final    NO GROWTH Performed at [facility], [place] [address], [place], [place] [address]    Report Status 02/27/2020 FINAL  Final  Culture, blood (routine x 2)     Status: None   Collection Time: 02/26/20 10:43 PM   Specimen: BLOOD  Result Value Ref Range Status   Specimen Description BLOOD SITE NOT SPECIFIED  Final   Special Requests   Final    AEROBIC BOTTLE ONLY Blood Culture results may not be optimal due to an inadequate volume of blood received in culture bottles   Culture   Final    NO GROWTH 5 DAYS Performed at [facility], [place] [address], [place], [place] [address]    Report Status  03/02/2020 FINAL  Final  Culture, blood (routine x 2)     Status: None   Collection Time: 02/26/20 10:43 PM   Specimen: BLOOD  Result Value Ref Range Status   Specimen Description BLOOD SITE NOT SPECIFIED  Final   Special Requests AEROBIC BOTTLE ONLY Blood Culture adequate volume  Final   Culture   Final    NO GROWTH 5 DAYS Performed at [facility] [address], [place], [place] [address]    Report Status 03/02/2020 FINAL  Final         Radiology Studies: CT ABDOMEN PELVIS WO CONTRAST  Result Date: 03/03/2020 CLINICAL DATA:  Anemia, history of recent left renal biopsy EXAM: CT ABDOMEN AND PELVIS WITHOUT CONTRAST TECHNIQUE: Multidetector CT imaging of the abdomen and pelvis was performed following the standard protocol without IV contrast. COMPARISON:  02/25/2020 FINDINGS: Lower chest: New bilateral pleural effusions and bibasilar atelectasis are noted when compared with the prior exam. No pneumothorax is seen. Cardiac blood pool is decreased in attenuation consistent with the underlying history of anemia. Hepatobiliary: No focal liver abnormality is seen. Status post cholecystectomy. No biliary dilatation. Pancreas: Unremarkable. No pancreatic ductal dilatation or surrounding inflammatory changes. Spleen: Normal in size without focal abnormality.  Adrenals/Urinary Tract: Adrenal glands are within normal limits. Right kidney demonstrates a nonobstructing stone in the midportion measuring 8 mm. Left kidney demonstrates no obstructive change although hyperdense material is noted surrounding the lower pole of the left kidney and extending superiorly consistent with perinephric hemorrhage related to the recent biopsy. No significant impingement upon the native kidney is noted. The bladder is decompressed. Stomach/Bowel: The appendix is not well visualized. No inflammatory changes to suggest appendicitis are noted. Colon shows no obstructive or inflammatory changes. Small bowel and stomach  appear within normal limits. Vascular/Lymphatic: Aortic atherosclerosis. No enlarged abdominal or pelvic lymph nodes. Reproductive: Multiple calcified uterine fibroids are noted. Other: Minimal free fluid is noted in the pelvis. Changes of anasarca are noted. Musculoskeletal: Lumbar spine degenerative changes are noted without acute abnormality. IMPRESSION: Changes consistent with the recent renal biopsy with perinephric hematoma identified. No significant impingement upon the underlying native kidney is noted. No obstructive changes are seen. Nonobstructing midpole right renal stone stable from the prior exam. New bilateral effusions and lower lobe atelectasis. Mild changes of anasarca Electronically Signed   By: Inez Catalina M.D.   On: 03/03/2020 10:50   DG Chest 2 View  Result Date: 03/01/2020 CLINICAL DATA:  Pneumonia. EXAM: CHEST - 2 VIEW COMPARISON:  02/29/2020 FINDINGS: Right jugular central venous catheter remains in appropriate position. Stable mild cardiomegaly. Diffuse interstitial infiltrates show no significant change. Persistent atelectasis or consolidation is seen in the left lower lobe. Probable small left pleural effusion again noted. IMPRESSION: Stable mild cardiomegaly and diffuse interstitial infiltrates. Stable left lower lobe atelectasis versus consolidation, and probable small left pleural effusion. Electronically Signed   By: Marlaine Hind M.D.   On: 03/01/2020 15:47   US BIOPSY (KIDNEY)  Result Date: 03/02/2020 CLINICAL DATA:  Renal insufficiency, acute kidney injury, hypertension EXAM: ULTRASOUND GUIDED RENAL CORE BIOPSY COMPARISON:  CT 02/25/2020 TECHNIQUE: Survey ultrasound was performed and an appropriate skin entry site was localized. Markedly echogenic renal parenchyma noted bilaterally. Site was marked, prepped with Betadine, draped in usual sterile fashion, infiltrated locally with 1% lidocaine. Intravenous Fentanyl 57mg and Versed 161mwere administered as conscious sedation  during continuous monitoring of the patient's level of consciousness and physiological / cardiorespiratory status by the radiology RN, with a total moderate sedation time of 12 minutes. Under real time ultrasound guidance, a 15 gauge trocar needle was advanced to the margin of the lower pole of the left kidney for 3 coaxial 16 gauge core biopsy needle passes. The core samples were submitted to pathology. The patient tolerated procedure well. COMPLICATIONS: None immediate IMPRESSION: 1. Technically successful ultrasound-guided core renal biopsy , left lower pole. Electronically Signed   By: D Lucrezia Europe.D.   On: 03/02/2020 15:45        Scheduled Meds: . B-complex with vitamin C  1 tablet Oral Daily  . chlorhexidine gluconate (MEDLINE KIT)  15 mL Mouth Rinse BID  . Chlorhexidine Gluconate Cloth  6 each Topical Q0600  . Chlorhexidine Gluconate Cloth  6 each Topical Q0600  . docusate sodium  100 mg Oral BID  . feeding supplement  237 mL Oral TID BM  . levETIRAcetam  250 mg Oral Q T,Th,Sa-HD  . levETIRAcetam  500 mg Oral BID  . mouth rinse  15 mL Mouth Rinse BID  . NIFEdipine  90 mg Oral Daily  . sodium chloride flush  10-40 mL Intracatheter Q12H   Continuous Infusions: . sodium chloride Stopped (02/28/20 1701)  . cefTRIAXone (ROCEPHIN)  IV 2  g (03/03/20 0849)     LOS: 7 days    Time spent: 35 minutes.     Elmarie Shiley, MD [facility]   If 7PM-7AM, please contact night-coverage [email or web address]  03/03/2020, 3:22 PM

## 2020-03-03 NOTE — Progress Notes (Signed)
Received call from blood bank that patient could only be authorized for 1 unit PRBC at this time due to blood shortage. Attending made aware.

## 2020-03-03 NOTE — Progress Notes (Signed)
   Pt underwent Random Renal biopsy 2/28 in IR  Hg 6.3 this am  CT IMPRESSION: Changes consistent with the recent renal biopsy with perinephric hematoma identified. No significant impingement upon the underlying native kidney is noted. No obstructive changes are seen.  Order for transfusion 2 units now per Dr Barbaraann Cao  IR aware of pt will keep on Radar Let us know if any needs

## 2020-03-04 DIAGNOSIS — J189 Pneumonia, unspecified organism: Secondary | ICD-10-CM | POA: Diagnosis present

## 2020-03-04 DIAGNOSIS — S37019A Minor contusion of unspecified kidney, initial encounter: Secondary | ICD-10-CM | POA: Diagnosis not present

## 2020-03-04 DIAGNOSIS — I469 Cardiac arrest, cause unspecified: Secondary | ICD-10-CM | POA: Diagnosis not present

## 2020-03-04 DIAGNOSIS — R569 Unspecified convulsions: Secondary | ICD-10-CM

## 2020-03-04 DIAGNOSIS — D649 Anemia, unspecified: Secondary | ICD-10-CM | POA: Diagnosis not present

## 2020-03-04 DIAGNOSIS — N390 Urinary tract infection, site not specified: Secondary | ICD-10-CM

## 2020-03-04 DIAGNOSIS — J181 Lobar pneumonia, unspecified organism: Secondary | ICD-10-CM

## 2020-03-04 DIAGNOSIS — N179 Acute kidney failure, unspecified: Secondary | ICD-10-CM | POA: Diagnosis not present

## 2020-03-04 DIAGNOSIS — J9621 Acute and chronic respiratory failure with hypoxia: Secondary | ICD-10-CM | POA: Diagnosis not present

## 2020-03-04 DIAGNOSIS — R8271 Bacteriuria: Secondary | ICD-10-CM | POA: Diagnosis present

## 2020-03-04 LAB — BPAM RBC
Blood Product Expiration Date: 202204012359
ISSUE DATE / TIME: 202203011327
Unit Type and Rh: 5100

## 2020-03-04 LAB — TYPE AND SCREEN
ABO/RH(D): O POS
Antibody Screen: NEGATIVE
Unit division: 0

## 2020-03-04 LAB — RENAL FUNCTION PANEL
Albumin: 1.4 g/dL — ABNORMAL LOW (ref 3.5–5.0)
Anion gap: 13 (ref 5–15)
BUN: 24 mg/dL — ABNORMAL HIGH (ref 8–23)
CO2: 23 mmol/L (ref 22–32)
Calcium: 7.7 mg/dL — ABNORMAL LOW (ref 8.9–10.3)
Chloride: 99 mmol/L (ref 98–111)
Creatinine, Ser: 5.35 mg/dL — ABNORMAL HIGH (ref 0.44–1.00)
GFR, Estimated: 8 mL/min — ABNORMAL LOW (ref >60)
Glucose, Bld: 125 mg/dL — ABNORMAL HIGH (ref 70–99)
Phosphorus: 3.7 mg/dL (ref 2.5–4.6)
Potassium: 3.9 mmol/L (ref 3.5–5.1)
Sodium: 135 mmol/L (ref 135–145)

## 2020-03-04 LAB — CBC
HCT: 24.4 % — ABNORMAL LOW (ref 36.0–46.0)
Hemoglobin: 7.9 g/dL — ABNORMAL LOW (ref 12.0–15.0)
MCH: 28.7 pg (ref 26.0–34.0)
MCHC: 32.4 g/dL (ref 30.0–36.0)
MCV: 88.7 fL (ref 80.0–100.0)
Platelets: 223 10*3/uL (ref 150–400)
RBC: 2.75 MIL/uL — ABNORMAL LOW (ref 3.87–5.11)
RDW: 15.1 % (ref 11.5–15.5)
WBC: 8.8 10*3/uL (ref 4.0–10.5)
nRBC: 0 % (ref 0.0–0.2)

## 2020-03-04 LAB — HEMOGLOBIN AND HEMATOCRIT, BLOOD
HCT: 24.2 % — ABNORMAL LOW (ref 36.0–46.0)
HCT: 24.9 % — ABNORMAL LOW (ref 36.0–46.0)
Hemoglobin: 7.8 g/dL — ABNORMAL LOW (ref 12.0–15.0)
Hemoglobin: 8.1 g/dL — ABNORMAL LOW (ref 12.0–15.0)

## 2020-03-04 MED ORDER — RENA-VITE PO TABS
1.0000 | ORAL_TABLET | Freq: Every day | ORAL | Status: DC
  Administered 2020-03-04 – 2020-03-11 (×8): 1 via ORAL
  Filled 2020-03-04 (×8): qty 1

## 2020-03-04 MED ORDER — DEXTROMETHORPHAN POLISTIREX ER 30 MG/5ML PO SUER
30.0000 mg | Freq: Two times a day (BID) | ORAL | Status: DC
  Administered 2020-03-04 – 2020-03-11 (×15): 30 mg via ORAL
  Filled 2020-03-04 (×17): qty 5

## 2020-03-04 MED ORDER — ACETAMINOPHEN 500 MG PO TABS: 1000.0000 mg | ORAL_TABLET | Freq: Four times a day (QID) | ORAL | Status: DC | PRN

## 2020-03-04 MED ORDER — ACETAMINOPHEN 500 MG PO TABS
1000.0000 mg | ORAL_TABLET | Freq: Three times a day (TID) | ORAL | Status: DC | PRN
  Administered 2020-03-04 – 2020-03-11 (×7): 1000 mg via ORAL
  Filled 2020-03-04 (×7): qty 2

## 2020-03-04 MED ORDER — ACETAMINOPHEN 325 MG PO TABS
ORAL_TABLET | ORAL | Status: AC
  Administered 2020-03-04: 325 mg
  Filled 2020-03-04: qty 2

## 2020-03-04 NOTE — Progress Notes (Incomplete Revision)
Progress Note    Barbara Crawford  RAQ:[id number] DOB: 1956/01/03  DOA: 02/25/2020 PCP: Vonna Drafts, FNP    Brief Narrative:   Chief complaint: Follow-up acute renal failure  Medical records reviewed and are as summarized below:  Barbara Crawford is an 65 y.o. female with a PMH of hypertension, prior Covid infection in December who was admitted 02/25/2020 with a chief complaint of a 1 week history of swelling in her lower extremities associated with lower back pain, malaise and fatigue.  In the ED, she was found to have a hemoglobin of 6.3, potassium 5.4, sodium 130, bicarb 9, BUN 144, and creatinine 29.98.  CT of the abdomen and pelvis was negative for obstruction or acute abnormality.  Temporary hemodialysis catheter was placed by IR and subsequently the patient had a PEA arrest associated with seizure-like activity.  She was intubated and subsequently extubated 12/28/2019.  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  Renal  biopsy done 03/03/2019.  ANA positive.  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  Continues to receive HD by nephrology.  Assessment/Plan:   Principal problem:  Acute Kidney Injury, POA Patient presented with elevation of BUN and creatinine of unclear etiology.    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 ANA positive, no M spike detected on protein electrophoresis, ASO negative, C 3 117, C 4 35, ANCA negative, GBM antibody negative.  Strand DNA negative, SCL 70+, SS a IgG more than 8+, chromatin antibody positive.  Status post renal biopsy 03/02/2020.  Developed a perinephric hematoma post biopsy.  Continue hemodialysis per nephrology.  Active problems: Perinephric hematoma Monitor hemoglobin closely.  Checking H&H every 8 hours.  Acute Hypoxic Respiratory Failure status post PEA arrest, resolved Occurred in the setting of cardiac arrest.  Extubated 12/27/2020.    Seizure Patient had seizure with loss of pulse and PEA arrest,  status post CPR. Patient was subsequently evaluated by neurology who restarted patient on Keppra.  Plan is to discharge patient on Keppra 500 mg twice daily.  Hypertension, chronic Continue with nifedipine and labetalol as needed.  Intermittently hypertensive.  Normocytic anemia/anemia of chronic disease/acute blood loss anemia Developed acute onset anemia status post kidney biopsy from perinephric hematoma.  Received 2 units of PRBCs 03/03/2020.  Transfuse for hemoglobin less than 7.  Continue Aranesp per nephrology.  Hemoglobin 7.9 today.  UTI Patient had fever, UA with more than 50 WBC.  Continue ceftriaxone.   Left lower lobar pneumonia Stable chest x-ray 03/01/20.  On ceftriaxone.  WBC has normalized, still complaining of cough, and has a fever today.  Fever may be related to autoimmune process.  Add Delsym for cough.  Sepsis Patient is persistently febrile with tachycardia in the setting of UTI and left lobar pneumonia.  Will broaden antibiotics to cefepime and vancomycin, check lactic acid, check procalcitonin.  Nutritional status Nutrition Problem: Increased nutrient needs Etiology: acute illness (renal failure requiring CRRT) Signs/Symptoms: estimated needs Interventions: Ensure Enlive (each supplement provides 350kcal and 20 grams of protein),MVI,Liberalize Diet  Body mass index is 29.22 kg/m.   Family Communication/Anticipated D/C date and plan/Code Status   DVT prophylaxis: SCDs Start: 02/25/20 2009   Current Level of Care::  Telemetry Medical Code Status: Full Code.  Family Communication: No family at the bedside. Disposition Plan: Status is: Inpatient  Remains inpatient appropriate because:Inpatient level of care appropriate due to severity of illness   Dispo: The patient is from: Home  Anticipated d/c is to: Unable to determine, pending PT/OT evaluations.              Patient currently is not medically stable to d/c.   Difficult to place patient  No    Medical Consultants:    Nephrology  Interventional Radiology  Neurology  Critical Care   Anti-Infectives:    Rocephin 2 g daily 02/29/2020---> 03/06/2019  Cefepime 03/06/2019--->  Vancomycin 03/06/2019--->   Subjective:   Tearful this afternoon, frustrated with her medical condition.  Febrile to 101.5.  Hot to touch.  Complains of cough with yellow sputum.  No nausea or vomiting.  Objective:    Vitals:   03/03/20 2035 03/04/20 0008 03/04/20 0459 03/04/20 0600  BP: (!) 138/124 (!) 144/77  (!) 150/97  Pulse: 100   100  Resp: _0 Temp: 98.9 F (37.2 C) 98.6 F (37 C) (!) 101.5 F (38.6 C) 99 F (37.2 C)  TempSrc: Oral Oral Oral Oral  SpO2:      Weight:      Height:        Intake/Output Summary (Last 24 hours) at 03/04/2020 4627 Last data filed at 03/03/2020 1726 Gross per 24 hour  Intake 314.67 ml  Output -  Net 314.67 ml   Filed Weights   02/29/20 0418 02/29/20 1315 02/29/20 1624  Weight: 74 kg 75.3 kg 74.8 kg    Exam: General: Tearful, otherwise no acute distress. Cardiovascular: Heart sounds are regular, tachycardic. No gallops or rubs. No murmurs. No JVD. Lungs: Clear to auscultation bilaterally with good air movement. No rales, rhonchi or wheezes. Abdomen: Soft, nontender, nondistended with normal active bowel sounds. No masses. No hepatosplenomegaly. Neurological: Alert and oriented 3. Moves all extremities 4 with equal strength. Cranial nerves II through XII grossly intact. Skin: Warm and dry. No rashes or lesions.  Skin hot to touch. Extremities: No clubbing or cyanosis.  Trace edema. Pedal pulses 2+. Psychiatric: Mood and affect are depressed.  Insight and judgment are fair.     Data Reviewed:   I have personally reviewed following labs and imaging studies:  Labs: Labs show the following:   Basic Metabolic Panel: Recent Labs  Lab 02/27/20 0619 02/27/20 1600 02/28/20 0501 02/28/20 1556 02/29/20 0352 03/01/20 0050  03/02/20 0324 03/03/20 0543 03/04/20 0044  NA 135   < > 136 134* 130* 135 135 135 135  K 4.1   < > 5.0 4.4 4.6 4.0 4.3 4.0 3.9  CL 101   < > 100 100 98 97* 99 98 99  CO2 18*   < > 24 24 21* _1 GLUCOSE 87   < > 115* 101* 93 101* 91 87 125*  BUN 87*   < > 36* 30* 35* 18 33* 16 24*  CREATININE 17.40*   < > 6.62* 5.20* 6.17* 4.31* 6.37* 3.90* 5.35*  CALCIUM 6.6*   < > 7.4* 7.2* 7.2* 8.4* 7.7* 8.3* 7.7*  MG 1.7  --  2.3  --  2.1  --   --   --   --   PHOS 7.7*   < > 3.6 3.4 3.1 3.7  --  3.0 3.7   < > = values in this interval not displayed.   GFR Estimated Creatinine Clearance: 10.4 mL/min (A) (by C-G formula based on SCr of 5.35 mg/dL (H)). Liver Function Tests: Recent Labs  Lab 02/26/20 1457 02/26/20 1529 02/29/20 0352 03/01/20 0050 03/02/20 0324 03/03/20 0543 03/04/20 0044  AST  46*  --   --  103* 70*  --   --   ALT 38  --   --  54* 53*  --   --   ALKPHOS 49  --   --  60 57  --   --   BILITOT 0.3  --   --  0.3 0.5  --   --   PROT 6.6  --   --  5.8* 5.7*  --   --   ALBUMIN 1.6*   < > 1.6* 1.4* 1.4* 1.3* 1.4*   < > = values in this interval not displayed.   Coagulation profile Recent Labs  Lab 02/26/20 2110  INR 1.0    CBC: Recent Labs  Lab 02/26/20 1457 02/26/20 1701 02/29/20 0352 03/01/20 0050 03/02/20 0850 03/03/20 0726 03/03/20 1918 03/04/20 0044  WBC 11.0*   < > 14.6* 15.3* 12.7* 9.6  --  8.8  NEUTROABS 7.4  --   --  13.1*  --   --   --   --   HGB 6.4*   < > 7.8* 7.8* 7.9* 6.3* 8.7* 7.9*  HCT 20.2*   < > 23.3* 23.2* 25.0* 20.2* 25.9* 24.4*  MCV 85.6   < > 86.0 86.2 88.0 87.8  --  88.7  PLT 317   < > 176 176 237 210  --  223   < > = values in this interval not displayed.   CBG: Recent Labs  Lab 02/28/20 2303 02/29/20 0308 02/29/20 0832 02/29/20 1114 03/02/20 1028  GLUCAP 87 84 97 115* 82    Microbiology Recent Results (from the past 240 hour(s))  Resp Panel by RT-PCR (Flu A&B, Covid) Nasopharyngeal Swab     Status: None   Collection  Time: 02/25/20  6:12 PM   Specimen: Nasopharyngeal Swab; Nasopharyngeal(NP) swabs in vial transport medium  Result Value Ref Range Status   SARS Coronavirus 2 by RT PCR NEGATIVE NEGATIVE Final    Comment: (NOTE) SARS-CoV-2 target nucleic acids are NOT DETECTED.  The SARS-CoV-2 RNA is generally detectable in upper respiratory specimens during the acute phase of infection. The lowest concentration of SARS-CoV-2 viral copies this assay can detect is 138 copies/mL. A negative result does not preclude SARS-Cov-2 infection and should not be used as the sole basis for treatment or other patient management decisions. A negative result may occur with  improper specimen collection/handling, submission of specimen other than nasopharyngeal swab, presence of viral mutation(s) within the areas targeted by this assay, and inadequate number of viral copies(<138 copies/mL). A negative result must be combined with clinical observations, patient history, and epidemiological information. The expected result is Negative.  Fact Sheet for Patients:  EntrepreneurPulse.com.au  Fact Sheet for Healthcare Providers:  IncredibleEmployment.be  This test is no t yet approved or cleared by the [place] FDA and  has been authorized for detection and/or diagnosis of SARS-CoV-2 by FDA under an Emergency Use Authorization (EUA). This EUA will remain  in effect (meaning this test can be used) for the duration of the COVID-19 declaration under Section 564(b)(1) of the Act, 21 U.S.C.section 360bbb-3(b)(1), unless the authorization is terminated  or revoked sooner.       Influenza A by PCR NEGATIVE NEGATIVE Final   Influenza B by PCR NEGATIVE NEGATIVE Final    Comment: (NOTE) The Xpert Xpress SARS-CoV-2/FLU/RSV plus assay is intended as an aid in the diagnosis of influenza from Nasopharyngeal swab specimens and should not be used as a sole basis for  treatment. Nasal washings  and aspirates are unacceptable for Xpert Xpress SARS-CoV-2/FLU/RSV testing.  Fact Sheet for Patients: EntrepreneurPulse.com.au  Fact Sheet for Healthcare Providers: IncredibleEmployment.be  This test is not yet approved or cleared by the [place] FDA and has been authorized for detection and/or diagnosis of SARS-CoV-2 by FDA under an Emergency Use Authorization (EUA). This EUA will remain in effect (meaning this test can be used) for the duration of the COVID-19 declaration under Section 564(b)(1) of the Act, 21 U.S.C. section 360bbb-3(b)(1), unless the authorization is terminated or revoked.  Performed at [facility], [place] [address], [place], [place] [address]   MRSA PCR Screening     Status: None   Collection Time: 02/26/20  4:00 PM   Specimen: Nasopharyngeal  Result Value Ref Range Status   MRSA by PCR NEGATIVE NEGATIVE Final    Comment:        The GeneXpert MRSA Assay (FDA approved for NASAL specimens only), is one component of a comprehensive MRSA colonization surveillance program. It is not intended to diagnose MRSA infection nor to guide or monitor treatment for MRSA infections. Performed at [facility], [place] [address], [place], [place] [address]   Culture, Urine     Status: None   Collection Time: 02/26/20  5:08 PM   Specimen: Urine, Random  Result Value Ref Range Status   Specimen Description URINE, RANDOM  Final   Special Requests NONE  Final   Culture   Final    NO GROWTH Performed at [facility], [place] [address], [place], [place] [address]    Report Status 02/27/2020 FINAL  Final  Culture, blood (routine x 2)     Status: None   Collection Time: 02/26/20 10:43 PM   Specimen: BLOOD  Result Value Ref Range Status   Specimen Description BLOOD SITE NOT SPECIFIED  Final   Special Requests   Final    AEROBIC BOTTLE ONLY Blood Culture results may not be optimal due to an inadequate volume of blood  received in culture bottles   Culture   Final    NO GROWTH 5 DAYS Performed at [facility], [place] [address] [place], [place] [address]    Report Status 03/02/2020 FINAL  Final  Culture, blood (routine x 2)     Status: None   Collection Time: 02/26/20 10:43 PM   Specimen: BLOOD  Result Value Ref Range Status   Specimen Description BLOOD SITE NOT SPECIFIED  Final   Special Requests AEROBIC BOTTLE ONLY Blood Culture adequate volume  Final   Culture   Final    NO GROWTH 5 DAYS Performed at [facility] [address] [place], [place] [address]    Report Status 03/02/2020 FINAL  Final  Urine Culture     Status: None (Preliminary result)   Collection Time: 03/04/20  5:30 AM   Specimen: Urine, Random  Result Value Ref Range Status   Specimen Description URINE, RANDOM  Final   Special Requests   Final    STERILE CUP Performed at [facility], [place] [address] [place], [place] [address]    Culture PENDING  Incomplete   Report Status PENDING  Incomplete    Procedures and diagnostic studies:  CT ABDOMEN PELVIS WO CONTRAST  Result Date: 03/03/2020 CLINICAL DATA:  Anemia, history of recent left renal biopsy EXAM: CT ABDOMEN AND PELVIS WITHOUT CONTRAST TECHNIQUE: Multidetector CT imaging of the abdomen and pelvis was performed following the  standard protocol without IV contrast. COMPARISON:  02/25/2020 FINDINGS: Lower chest: New bilateral pleural effusions and bibasilar atelectasis are noted when compared with the prior exam. No pneumothorax is seen. Cardiac blood pool is decreased in attenuation consistent with the underlying history of anemia. Hepatobiliary: No focal liver abnormality is seen. Status post cholecystectomy. No biliary dilatation. Pancreas: Unremarkable. No pancreatic ductal dilatation or surrounding inflammatory changes. Spleen: Normal in size without focal abnormality. Adrenals/Urinary Tract: Adrenal glands are within normal limits. Right kidney demonstrates a  nonobstructing stone in the midportion measuring 8 mm. Left kidney demonstrates no obstructive change although hyperdense material is noted surrounding the lower pole of the left kidney and extending superiorly consistent with perinephric hemorrhage related to the recent biopsy. No significant impingement upon the native kidney is noted. The bladder is decompressed. Stomach/Bowel: The appendix is not well visualized. No inflammatory changes to suggest appendicitis are noted. Colon shows no obstructive or inflammatory changes. Small bowel and stomach appear within normal limits. Vascular/Lymphatic: Aortic atherosclerosis. No enlarged abdominal or pelvic lymph nodes. Reproductive: Multiple calcified uterine fibroids are noted. Other: Minimal free fluid is noted in the pelvis. Changes of anasarca are noted. Musculoskeletal: Lumbar spine degenerative changes are noted without acute abnormality. IMPRESSION: Changes consistent with the recent renal biopsy with perinephric hematoma identified. No significant impingement upon the underlying native kidney is noted. No obstructive changes are seen. Nonobstructing midpole right renal stone stable from the prior exam. New bilateral effusions and lower lobe atelectasis. Mild changes of anasarca Electronically Signed   By: Inez Catalina M.D.   On: 03/03/2020 10:50   US BIOPSY (KIDNEY)  Result Date: 03/02/2020 CLINICAL DATA:  Renal insufficiency, acute kidney injury, hypertension EXAM: ULTRASOUND GUIDED RENAL CORE BIOPSY COMPARISON:  CT 02/25/2020 TECHNIQUE: Survey ultrasound was performed and an appropriate skin entry site was localized. Markedly echogenic renal parenchyma noted bilaterally. Site was marked, prepped with Betadine, draped in usual sterile fashion, infiltrated locally with 1% lidocaine. Intravenous Fentanyl 50mg and Versed 175mwere administered as conscious sedation during continuous monitoring of the patient's level of consciousness and physiological /  cardiorespiratory status by the radiology RN, with a total moderate sedation time of 12 minutes. Under real time ultrasound guidance, a 15 gauge trocar needle was advanced to the margin of the lower pole of the left kidney for 3 coaxial 16 gauge core biopsy needle passes. The core samples were submitted to pathology. The patient tolerated procedure well. COMPLICATIONS: None immediate IMPRESSION: 1. Technically successful ultrasound-guided core renal biopsy , left lower pole. Electronically Signed   By: D Lucrezia Europe.D.   On: 03/02/2020 15:45    Medications:   . B-complex with vitamin C  1 tablet Oral Daily  . chlorhexidine gluconate (MEDLINE KIT)  15 mL Mouth Rinse BID  . Chlorhexidine Gluconate Cloth  6 each Topical Q0600  . Chlorhexidine Gluconate Cloth  6 each Topical Q0600  . docusate sodium  100 mg Oral BID  . feeding supplement  237 mL Oral TID BM  . levETIRAcetam  250 mg Oral Q T,Th,Sa-HD  . levETIRAcetam  500 mg Oral BID  . mouth rinse  15 mL Mouth Rinse BID  . NIFEdipine  90 mg Oral Daily  . sodium chloride flush  10-40 mL Intracatheter Q12H   Continuous Infusions: . sodium chloride Stopped (02/28/20 1701)  . cefTRIAXone (ROCEPHIN)  IV 2 g (03/03/20 0849)     LOS: 8 days   ChJacquelynn CreeMD  [facility]   [facility] How to  contact the [facility] Attending or Consulting provider [place] or covering provider during after hours [place], for this patient?  1. Check the care team in [facility] and look for a) attending/consulting [facility] provider listed and b) the [facility] team listed 2. Log into [email or web address] and use [place]'s universal password to access. If you do not have the password, please contact the hospital operator. 3. Locate the [facility] provider you are looking for under [facility] and page to a number that you can be directly reached. 4. If you still have difficulty reaching the provider, please page the [facility] (Director on Call) for the Hospitalists listed on amion for  assistance.  03/04/2020, 7:12 AM

## 2020-03-04 NOTE — Progress Notes (Addendum)
Patient ID: Barbara Crawford, female   DOB: 11-Dec-1955, 65 y.o.   MRN: [id number] Pt s/p US guided left lower pole renal bx on 2/28; had perinephric hematoma noted on CT post bx; currently without sig abd/flank pain; last BP 150/97; temp sl elevated at 100; hgb this am 7.8(7.9)- latest at 1510 today is 8.1; has had PRBC; left flank puncture site soft, clean and dry, NT; continue to closely monitor labs for now

## 2020-03-04 NOTE — Progress Notes (Signed)
Patient ID: Barbara Crawford, female   DOB: [birth year], 64 y.o.   MRN: [id number] S: No complaints O:BP (!) 150/97 (BP Location: Right Arm)   Pulse 100   Temp 99 F (37.2 C) (Oral)   Resp 20   Ht 5' 2.99" (1.6 m)   Wt 74.8 kg   SpO2 95%   BMI 29.22 kg/m   Intake/Output Summary (Last 24 hours) at 03/04/2020 0844 Last data filed at 03/03/2020 1726 Gross per 24 hour  Intake 314.67 ml  Output --  Net 314.67 ml   Intake/Output: I/O last 3 completed shifts: In: 314.7 [Blood:314.7] Out: -   Intake/Output this shift:  No intake/output data recorded. Weight change:  Gen: NAD CVS: tachy at 100 Resp: CTA Abd:+bs, soft, NT/ND Ext: no edema  Recent Labs  Lab 02/26/20 1457 02/26/20 1529 02/27/20 1600 02/28/20 0501 02/28/20 1556 02/29/20 0352 03/01/20 0050 03/02/20 0324 03/03/20 0543 03/04/20 0044  NA 134*   < > 136 136 134* 130* 135 135 135 135  K 4.4   < > 4.2 5.0 4.4 4.6 4.0 4.3 4.0 3.9  CL 96*   < > 102 100 100 98 97* 99 98 99  CO2 10*   < > 22 24 24  21* 26 25 24 23   GLUCOSE 150*   < > 110* 115* 101* 93 101* 91 87 125*  BUN 143*   < > 53* 36* 30* 35* 18 33* 16 24*  CREATININE 30.13*   < > 10.90* 6.62* 5.20* 6.17* 4.31* 6.37* 3.90* 5.35*  ALBUMIN 1.6*   < > 1.7* 1.6* 1.6* 1.6* 1.4* 1.4* 1.3* 1.4*  CALCIUM 6.5*   < > 7.4* 7.4* 7.2* 7.2* 8.4* 7.7* 8.3* 7.7*  PHOS  --    < > 5.0* 3.6 3.4 3.1 3.7  --  3.0 3.7  AST 46*  --   --   --   --   --  103* 70*  --   --   ALT 38  --   --   --   --   --  54* 53*  --   --    < > = values in this interval not displayed.   Liver Function Tests: Recent Labs  Lab 02/26/20 1457 02/26/20 1529 03/01/20 0050 03/02/20 0324 03/03/20 0543 03/04/20 0044  AST 46*  --  103* 70*  --   --   ALT 38  --  54* 53*  --   --   ALKPHOS 49  --  60 57  --   --   BILITOT 0.3  --  0.3 0.5  --   --   PROT 6.6  --  5.8* 5.7*  --   --   ALBUMIN 1.6*   < > 1.4* 1.4* 1.3* 1.4*   < > = values in this interval not displayed.   No results for input(s):  LIPASE, AMYLASE in the last 168 hours. No results for input(s): AMMONIA in the last 168 hours. CBC: Recent Labs  Lab 02/26/20 1457 02/26/20 1701 02/29/20 0352 03/01/20 0050 03/02/20 0850 03/03/20 0726 03/03/20 1918 03/04/20 0044 03/04/20 0740  WBC 11.0*   < > 14.6* 15.3* 12.7* 9.6  --  8.8  --   NEUTROABS 7.4  --   --  13.1*  --   --   --   --   --   HGB 6.4*   < > 7.8* 7.8* 7.9* 6.3* 8.7* 7.9* 7.8*  HCT 20.2*   < >  23.3* 23.2* 25.0* 20.2* 25.9* 24.4* 24.2*  MCV 85.6   < > 86.0 86.2 88.0 87.8  --  88.7  --   PLT 317   < > 176 176 237 210  --  223  --    < > = values in this interval not displayed.   Cardiac Enzymes: No results for input(s): CKTOTAL, CKMB, CKMBINDEX, TROPONINI in the last 168 hours. CBG: Recent Labs  Lab 02/28/20 2303 02/29/20 0308 02/29/20 0832 02/29/20 1114 03/02/20 1028  GLUCAP 87 84 97 115* 82    Iron Studies: No results for input(s): IRON, TIBC, TRANSFERRIN, FERRITIN in the last 72 hours. Studies/Results: CT ABDOMEN PELVIS WO CONTRAST  Result Date: 03/03/2020 CLINICAL DATA:  Anemia, history of recent left renal biopsy EXAM: CT ABDOMEN AND PELVIS WITHOUT CONTRAST TECHNIQUE: Multidetector CT imaging of the abdomen and pelvis was performed following the standard protocol without IV contrast. COMPARISON:  02/25/2020 FINDINGS: Lower chest: New bilateral pleural effusions and bibasilar atelectasis are noted when compared with the prior exam. No pneumothorax is seen. Cardiac blood pool is decreased in attenuation consistent with the underlying history of anemia. Hepatobiliary: No focal liver abnormality is seen. Status post cholecystectomy. No biliary dilatation. Pancreas: Unremarkable. No pancreatic ductal dilatation or surrounding inflammatory changes. Spleen: Normal in size without focal abnormality. Adrenals/Urinary Tract: Adrenal glands are within normal limits. Right kidney demonstrates a nonobstructing stone in the midportion measuring 8 mm. Left kidney  demonstrates no obstructive change although hyperdense material is noted surrounding the lower pole of the left kidney and extending superiorly consistent with perinephric hemorrhage related to the recent biopsy. No significant impingement upon the native kidney is noted. The bladder is decompressed. Stomach/Bowel: The appendix is not well visualized. No inflammatory changes to suggest appendicitis are noted. Colon shows no obstructive or inflammatory changes. Small bowel and stomach appear within normal limits. Vascular/Lymphatic: Aortic atherosclerosis. No enlarged abdominal or pelvic lymph nodes. Reproductive: Multiple calcified uterine fibroids are noted. Other: Minimal free fluid is noted in the pelvis. Changes of anasarca are noted. Musculoskeletal: Lumbar spine degenerative changes are noted without acute abnormality. IMPRESSION: Changes consistent with the recent renal biopsy with perinephric hematoma identified. No significant impingement upon the underlying native kidney is noted. No obstructive changes are seen. Nonobstructing midpole right renal stone stable from the prior exam. New bilateral effusions and lower lobe atelectasis. Mild changes of anasarca Electronically Signed   By: Inez Catalina M.D.   On: 03/03/2020 10:50   US BIOPSY (KIDNEY)  Result Date: 03/02/2020 CLINICAL DATA:  Renal insufficiency, acute kidney injury, hypertension EXAM: ULTRASOUND GUIDED RENAL CORE BIOPSY COMPARISON:  CT 02/25/2020 TECHNIQUE: Survey ultrasound was performed and an appropriate skin entry site was localized. Markedly echogenic renal parenchyma noted bilaterally. Site was marked, prepped with Betadine, draped in usual sterile fashion, infiltrated locally with 1% lidocaine. Intravenous Fentanyl 12mg and Versed 148mwere administered as conscious sedation during continuous monitoring of the patient's level of consciousness and physiological / cardiorespiratory status by the radiology RN, with a total moderate  sedation time of 12 minutes. Under real time ultrasound guidance, a 15 gauge trocar needle was advanced to the margin of the lower pole of the left kidney for 3 coaxial 16 gauge core biopsy needle passes. The core samples were submitted to pathology. The patient tolerated procedure well. COMPLICATIONS: None immediate IMPRESSION: 1. Technically successful ultrasound-guided core renal biopsy , left lower pole. Electronically Signed   By: D Lucrezia Europe.D.   On: 03/02/2020 15:45   .  B-complex with vitamin C  1 tablet Oral Daily  . chlorhexidine gluconate (MEDLINE KIT)  15 mL Mouth Rinse BID  . Chlorhexidine Gluconate Cloth  6 each Topical Q0600  . Chlorhexidine Gluconate Cloth  6 each Topical Q0600  . docusate sodium  100 mg Oral BID  . feeding supplement  237 mL Oral TID BM  . levETIRAcetam  250 mg Oral Q T,Th,Sa-HD  . levETIRAcetam  500 mg Oral BID  . mouth rinse  15 mL Mouth Rinse BID  . NIFEdipine  90 mg Oral Daily  . sodium chloride flush  10-40 mL Intracatheter Q12H    BMET    Component Value Date/Time   NA 135 03/04/2020 0044   K 3.9 03/04/2020 0044   CL 99 03/04/2020 0044   CO2 23 03/04/2020 0044   GLUCOSE 125 (H) 03/04/2020 0044   BUN 24 (H) 03/04/2020 0044   CREATININE 5.35 (H) 03/04/2020 0044   CALCIUM 7.7 (L) 03/04/2020 0044   CALCIUM 6.2 (LL) 02/26/2020 0305   GFRNONAA 8 (L) 03/04/2020 0044   GFRAA >60 01/28/2016 1543   CBC    Component Value Date/Time   WBC 8.8 03/04/2020 0044   RBC 2.75 (L) 03/04/2020 0044   HGB 7.8 (L) 03/04/2020 0740   HCT 24.2 (L) 03/04/2020 0740   PLT 223 03/04/2020 0044   MCV 88.7 03/04/2020 0044   MCH 28.7 03/04/2020 0044   MCHC 32.4 03/04/2020 0044   RDW 15.1 03/04/2020 0044   LYMPHSABS 0.8 03/01/2020 0050   MONOABS 1.2 (H) 03/01/2020 0050   EOSABS 0.0 03/01/2020 0050   BASOSABS 0.1 03/01/2020 0050      Assessment/Plan:  1. 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 Appears was brewing in 12/2019 with PCP  referring her to nephrology then, Cr was 1.8 at that time. Hep C neg, hep B s antigen neg. nontunneled catheter on 02/25/20. UA with >300 mg/dl protein and 0-5 RBC. ANA positive.ANCA negative. Complement normal. RPR nonreactive. Anti-GBM neg. ASO normal. SPEP no M spike. Free light chain ratio normal. Started on CRRT on 2/24 after nontunneled catheter with IR.IHD on 02/29/20.  1. S/p renal biopsy 03/02/20 2. S/p 1st HD 03/03/20 and 2nd today (no heparin) 3. Will need permanent access given oliguria and ongoing need for HD. 2. PEA arrest after temp HD catheter placed. Resolved and extubated. 3. HTN - ACEi on hold due to AKI 4. Vascular access- will need TDC and avf/avg placement this hospitalization if renal function does not recover soon. 1. S/p temp HD cath on 02/25/20 2. Will consult [facility] 5. Anemia - improved s/p transfusion 6. Seizure activity - neurology consulted and felt to be related to metabolic derangements. On keppra per neuro. 7. CKD-MBD - stable 8. Nephrotic syndrome - biopsy results pending   Donetta Potts, MD [facility] [phone]

## 2020-03-04 NOTE — Plan of Care (Signed)
  Problem: Clinical Measurements: Goal: Will remain free from infection Outcome: Progressing   Problem: Clinical Measurements: Goal: Ability to maintain clinical measurements within normal limits will improve Outcome: Progressing Goal: Will remain free from infection Outcome: Progressing Goal: Diagnostic test results will improve Outcome: Progressing Goal: Respiratory complications will improve Outcome: Progressing Goal: Cardiovascular complication will be avoided Outcome: Progressing   Problem: Activity: Goal: Risk for activity intolerance will decrease Outcome: Progressing   Problem: Nutrition: Goal: Adequate nutrition will be maintained Outcome: Progressing   Problem: Coping: Goal: Level of anxiety will decrease Outcome: Progressing   Problem: Elimination: Goal: Will not experience complications related to bowel motility Outcome: Progressing Goal: Will not experience complications related to urinary retention Outcome: Progressing   Problem: Pain Managment: Goal: General experience of comfort will improve Outcome: Progressing   Problem: Safety: Goal: Ability to remain free from injury will improve Outcome: Progressing   Problem: Skin Integrity: Goal: Risk for impaired skin integrity will decrease Outcome: Progressing   Problem: Education: Goal: Knowledge of disease and its progression will improve Outcome: Progressing   Problem: Health Behavior/Discharge Planning: Goal: Ability to manage health-related needs will improve Outcome: Progressing   Problem: Clinical Measurements: Goal: Complications related to the disease process or treatment will be avoided or minimized Outcome: Progressing Goal: Dialysis access will remain free of complications Outcome: Progressing   Problem: Activity: Goal: Activity intolerance will improve Outcome: Progressing   Problem: Fluid Volume: Goal: Fluid volume balance will be maintained or improved Outcome: Progressing    Problem: Nutritional: Goal: Ability to make appropriate dietary choices will improve Outcome: Progressing   Problem: Respiratory: Goal: Respiratory symptoms related to disease process will be avoided Outcome: Progressing   Problem: Self-Concept: Goal: Body image disturbance will be avoided or minimized Outcome: Progressing   Problem: Urinary Elimination: Goal: Progression of disease will be identified and treated Outcome: Progressing   Problem: Activity: Goal: Ability to tolerate increased activity will improve Outcome: Progressing   Problem: Respiratory: Goal: Ability to maintain a clear airway and adequate ventilation will improve Outcome: Progressing   Problem: Role Relationship: Goal: Method of communication will improve Outcome: Progressing

## 2020-03-04 NOTE — Progress Notes (Addendum)
   Progress Note    Corley B Thomas Scott  MRN:[id number] DOB: 09/28/1955  DOA: 02/25/2020 PCP: Anderson, Takela N, FNP    Brief Narrative:   Chief complaint: Follow-up acute renal failure  Medical records reviewed and are as summarized below:  Naje B Thomas Scott is an 65 y.o. female with a PMH of hypertension, prior Covid infection in December who was admitted 02/25/2020 with a chief complaint of a 1 week history of swelling in her lower extremities associated with lower back pain, malaise and fatigue.  In the ED, she was found to have a hemoglobin of 6.3, potassium 5.4, sodium 130, bicarb 9, BUN 144, and creatinine 29.98.  CT of the abdomen and pelvis was negative for obstruction or acute abnormality.  Temporary hemodialysis catheter was placed by IR and subsequently the patient had a PEA arrest associated with seizure-like activity.  She was intubated and subsequently extubated 12/28/2019.  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  Renal  biopsy done 03/03/2019.  ANA positive.  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  Continues to receive HD by nephrology.  Assessment/Plan:   Principal problem:  Acute Kidney Injury, POA Patient presented with elevation of BUN and creatinine of unclear etiology.    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 ANA positive, no M spike detected on protein electrophoresis, ASO negative, C 3 117, C 4 35, ANCA negative, GBM antibody negative.  Strand DNA negative, SCL 70+, SS a IgG more than 8+, chromatin antibody positive.  Status post renal biopsy 03/02/2020.  Developed a perinephric hematoma post biopsy.  Continue hemodialysis per nephrology.  Active problems: Perinephric hematoma Monitor hemoglobin closely.  Checking H&H every 8 hours.  Acute Hypoxic Respiratory Failure status post PEA arrest, resolved Occurred in the setting of cardiac arrest.  Extubated 12/27/2020.    Seizure Patient had seizure with loss of pulse and PEA arrest,  status post CPR. Patient was subsequently evaluated by neurology who restarted patient on Keppra.  Plan is to discharge patient on Keppra 500 mg twice daily.  Hypertension, chronic Continue with nifedipine and labetalol as needed.  Intermittently hypertensive.  Normocytic anemia/anemia of chronic disease/acute blood loss anemia Developed acute onset anemia status post kidney biopsy from perinephric hematoma.  Received 2 units of PRBCs 03/03/2020.  Transfuse for hemoglobin less than 7.  Continue Aranesp per nephrology.  Hemoglobin 7.9 today.  UTI Patient had fever, UA with more than 50 WBC.  Continue ceftriaxone.   Left lower lobar pneumonia Stable chest x-ray 03/01/20.  On ceftriaxone.  WBC has normalized, still complaining of cough, and has a fever today.  Fever may be related to autoimmune process.  Add Delsym for cough.  Sepsis Patient is persistently febrile with tachycardia in the setting of UTI and left lobar pneumonia.  Will broaden antibiotics to cefepime and vancomycin, check lactic acid, check procalcitonin.  Nutritional status Nutrition Problem: Increased nutrient needs Etiology: acute illness (renal failure requiring CRRT) Signs/Symptoms: estimated needs Interventions: Ensure Enlive (each supplement provides 350kcal and 20 grams of protein),MVI,Liberalize Diet  Body mass index is 29.22 kg/m.   Family Communication/Anticipated D/C date and plan/Code Status   DVT prophylaxis: SCDs Start: 02/25/20 2009   Current Level of Care::  Telemetry Medical Code Status: Full Code.  Family Communication: No family at the bedside. Disposition Plan: Status is: Inpatient  Remains inpatient appropriate because:Inpatient level of care appropriate due to severity of illness   Dispo: The patient is from: Home                Anticipated d/c is to: Unable to determine, pending PT/OT evaluations.              Patient currently is not medically stable to d/c.   Difficult to place patient  No    Medical Consultants:    Nephrology  Interventional Radiology  Neurology  Critical Care   Anti-Infectives:    Rocephin 2 g daily 02/29/2020---> 03/06/2019  Cefepime 03/06/2019--->  Vancomycin 03/06/2019--->   Subjective:   Tearful this afternoon, frustrated with her medical condition.  Febrile to 101.5.  Hot to touch.  Complains of cough with yellow sputum.  No nausea or vomiting.  Objective:    Vitals:   03/03/20 2035 03/04/20 0008 03/04/20 0459 03/04/20 0600  BP: (!) 138/124 (!) 144/77  (!) 150/97  Pulse: 100   100  Resp: 20 20  20  Temp: 98.9 F (37.2 C) 98.6 F (37 C) (!) 101.5 F (38.6 C) 99 F (37.2 C)  TempSrc: Oral Oral Oral Oral  SpO2:      Weight:      Height:        Intake/Output Summary (Last 24 hours) at 03/04/2020 0712 Last data filed at 03/03/2020 1726 Gross per 24 hour  Intake 314.67 ml  Output -  Net 314.67 ml   Filed Weights   02/29/20 0418 02/29/20 1315 02/29/20 1624  Weight: 74 kg 75.3 kg 74.8 kg    Exam: General: Tearful, otherwise no acute distress. Cardiovascular: Heart sounds are regular, tachycardic. No gallops or rubs. No murmurs. No JVD. Lungs: Clear to auscultation bilaterally with good air movement. No rales, rhonchi or wheezes. Abdomen: Soft, nontender, nondistended with normal active bowel sounds. No masses. No hepatosplenomegaly. Neurological: Alert and oriented 3. Moves all extremities 4 with equal strength. Cranial nerves II through XII grossly intact. Skin: Warm and dry. No rashes or lesions.  Skin hot to touch. Extremities: No clubbing or cyanosis.  Trace edema. Pedal pulses 2+. Psychiatric: Mood and affect are depressed.  Insight and judgment are fair.     Data Reviewed:   I have personally reviewed following labs and imaging studies:  Labs: Labs show the following:   Basic Metabolic Panel: Recent Labs  Lab 02/27/20 0619 02/27/20 1600 02/28/20 0501 02/28/20 1556 02/29/20 0352 03/01/20 0050  03/02/20 0324 03/03/20 0543 03/04/20 0044  NA 135   < > 136 134* 130* 135 135 135 135  K 4.1   < > 5.0 4.4 4.6 4.0 4.3 4.0 3.9  CL 101   < > 100 100 98 97* 99 98 99  CO2 18*   < > 24 24 21* 26 25 24 23  GLUCOSE 87   < > 115* 101* 93 101* 91 87 125*  BUN 87*   < > 36* 30* 35* 18 33* 16 24*  CREATININE 17.40*   < > 6.62* 5.20* 6.17* 4.31* 6.37* 3.90* 5.35*  CALCIUM 6.6*   < > 7.4* 7.2* 7.2* 8.4* 7.7* 8.3* 7.7*  MG 1.7  --  2.3  --  2.1  --   --   --   --   PHOS 7.7*   < > 3.6 3.4 3.1 3.7  --  3.0 3.7   < > = values in this interval not displayed.   GFR Estimated Creatinine Clearance: 10.4 mL/min (A) (by C-G formula based on SCr of 5.35 mg/dL (H)). Liver Function Tests: Recent Labs  Lab 02/26/20 1457 02/26/20 1529 02/29/20 0352 03/01/20 0050 03/02/20 0324 03/03/20 0543 03/04/20 0044  AST   46*  --   --  103* 70*  --   --   ALT 38  --   --  54* 53*  --   --   ALKPHOS 49  --   --  60 57  --   --   BILITOT 0.3  --   --  0.3 0.5  --   --   PROT 6.6  --   --  5.8* 5.7*  --   --   ALBUMIN 1.6*   < > 1.6* 1.4* 1.4* 1.3* 1.4*   < > = values in this interval not displayed.   Coagulation profile Recent Labs  Lab 02/26/20 2110  INR 1.0    CBC: Recent Labs  Lab 02/26/20 1457 02/26/20 1701 02/29/20 0352 03/01/20 0050 03/02/20 0850 03/03/20 0726 03/03/20 1918 03/04/20 0044  WBC 11.0*   < > 14.6* 15.3* 12.7* 9.6  --  8.8  NEUTROABS 7.4  --   --  13.1*  --   --   --   --   HGB 6.4*   < > 7.8* 7.8* 7.9* 6.3* 8.7* 7.9*  HCT 20.2*   < > 23.3* 23.2* 25.0* 20.2* 25.9* 24.4*  MCV 85.6   < > 86.0 86.2 88.0 87.8  --  88.7  PLT 317   < > 176 176 237 210  --  223   < > = values in this interval not displayed.   CBG: Recent Labs  Lab 02/28/20 2303 02/29/20 0308 02/29/20 0832 02/29/20 1114 03/02/20 1028  GLUCAP 87 84 97 115* 82    Microbiology Recent Results (from the past 240 hour(s))  Resp Panel by RT-PCR (Flu A&B, Covid) Nasopharyngeal Swab     Status: None   Collection  Time: 02/25/20  6:12 PM   Specimen: Nasopharyngeal Swab; Nasopharyngeal(NP) swabs in vial transport medium  Result Value Ref Range Status   SARS Coronavirus 2 by RT PCR NEGATIVE NEGATIVE Final    Comment: (NOTE) SARS-CoV-2 target nucleic acids are NOT DETECTED.  The SARS-CoV-2 RNA is generally detectable in upper respiratory specimens during the acute phase of infection. The lowest concentration of SARS-CoV-2 viral copies this assay can detect is 138 copies/mL. A negative result does not preclude SARS-Cov-2 infection and should not be used as the sole basis for treatment or other patient management decisions. A negative result may occur with  improper specimen collection/handling, submission of specimen other than nasopharyngeal swab, presence of viral mutation(s) within the areas targeted by this assay, and inadequate number of viral copies(<138 copies/mL). A negative result must be combined with clinical observations, patient history, and epidemiological information. The expected result is Negative.  Fact Sheet for Patients:  https://www.fda.gov/media/152166/download  Fact Sheet for Healthcare Providers:  https://www.fda.gov/media/152162/download  This test is no t yet approved or cleared by the [place] FDA and  has been authorized for detection and/or diagnosis of SARS-CoV-2 by FDA under an Emergency Use Authorization (EUA). This EUA will remain  in effect (meaning this test can be used) for the duration of the COVID-19 declaration under Section 564(b)(1) of the Act, 21 U.S.C.section 360bbb-3(b)(1), unless the authorization is terminated  or revoked sooner.       Influenza A by PCR NEGATIVE NEGATIVE Final   Influenza B by PCR NEGATIVE NEGATIVE Final    Comment: (NOTE) The Xpert Xpress SARS-CoV-2/FLU/RSV plus assay is intended as an aid in the diagnosis of influenza from Nasopharyngeal swab specimens and should not be used as a sole basis for   treatment. Nasal washings  and aspirates are unacceptable for Xpert Xpress SARS-CoV-2/FLU/RSV testing.  Fact Sheet for Patients: https://www.fda.gov/media/152166/download  Fact Sheet for Healthcare Providers: https://www.fda.gov/media/152162/download  This test is not yet approved or cleared by the [place] FDA and has been authorized for detection and/or diagnosis of SARS-CoV-2 by FDA under an Emergency Use Authorization (EUA). This EUA will remain in effect (meaning this test can be used) for the duration of the COVID-19 declaration under Section 564(b)(1) of the Act, 21 U.S.C. section 360bbb-3(b)(1), unless the authorization is terminated or revoked.  Performed at [facility], [address] [place], [place] [address]   MRSA PCR Screening     Status: None   Collection Time: 02/26/20  4:00 PM   Specimen: Nasopharyngeal  Result Value Ref Range Status   MRSA by PCR NEGATIVE NEGATIVE Final    Comment:        The GeneXpert MRSA Assay (FDA approved for NASAL specimens only), is one component of a comprehensive MRSA colonization surveillance program. It is not intended to diagnose MRSA infection nor to guide or monitor treatment for MRSA infections. Performed at [facility], [address] [place], [place] [address]   Culture, Urine     Status: None   Collection Time: 02/26/20  5:08 PM   Specimen: Urine, Random  Result Value Ref Range Status   Specimen Description URINE, RANDOM  Final   Special Requests NONE  Final   Culture   Final    NO GROWTH Performed at [facility], [address] [place], [place] [address]    Report Status 02/27/2020 FINAL  Final  Culture, blood (routine x 2)     Status: None   Collection Time: 02/26/20 10:43 PM   Specimen: BLOOD  Result Value Ref Range Status   Specimen Description BLOOD SITE NOT SPECIFIED  Final   Special Requests   Final    AEROBIC BOTTLE ONLY Blood Culture results may not be optimal due to an inadequate volume of blood  received in culture bottles   Culture   Final    NO GROWTH 5 DAYS Performed at [facility], [address] [place], [place] [address]    Report Status 03/02/2020 FINAL  Final  Culture, blood (routine x 2)     Status: None   Collection Time: 02/26/20 10:43 PM   Specimen: BLOOD  Result Value Ref Range Status   Specimen Description BLOOD SITE NOT SPECIFIED  Final   Special Requests AEROBIC BOTTLE ONLY Blood Culture adequate volume  Final   Culture   Final    NO GROWTH 5 DAYS Performed at [facility], [address] [place], [place] [address]    Report Status 03/02/2020 FINAL  Final  Urine Culture     Status: None (Preliminary result)   Collection Time: 03/04/20  5:30 AM   Specimen: Urine, Random  Result Value Ref Range Status   Specimen Description URINE, RANDOM  Final   Special Requests   Final    STERILE CUP Performed at [facility], [address] ,  [address]    Culture PENDING  Incomplete   Report Status PENDING  Incomplete    Procedures and diagnostic studies:  CT ABDOMEN PELVIS WO CONTRAST  Result Date: 03/03/2020 CLINICAL DATA:  Anemia, history of recent left renal biopsy EXAM: CT ABDOMEN AND PELVIS WITHOUT CONTRAST TECHNIQUE: Multidetector CT imaging of the abdomen and pelvis was performed following the   standard protocol without IV contrast. COMPARISON:  02/25/2020 FINDINGS: Lower chest: New bilateral pleural effusions and bibasilar atelectasis are noted when compared with the prior exam. No pneumothorax is seen. Cardiac blood pool is decreased in attenuation consistent with the underlying history of anemia. Hepatobiliary: No focal liver abnormality is seen. Status post cholecystectomy. No biliary dilatation. Pancreas: Unremarkable. No pancreatic ductal dilatation or surrounding inflammatory changes. Spleen: Normal in size without focal abnormality. Adrenals/Urinary Tract: Adrenal glands are within normal limits. Right kidney demonstrates a  nonobstructing stone in the midportion measuring 8 mm. Left kidney demonstrates no obstructive change although hyperdense material is noted surrounding the lower pole of the left kidney and extending superiorly consistent with perinephric hemorrhage related to the recent biopsy. No significant impingement upon the native kidney is noted. The bladder is decompressed. Stomach/Bowel: The appendix is not well visualized. No inflammatory changes to suggest appendicitis are noted. Colon shows no obstructive or inflammatory changes. Small bowel and stomach appear within normal limits. Vascular/Lymphatic: Aortic atherosclerosis. No enlarged abdominal or pelvic lymph nodes. Reproductive: Multiple calcified uterine fibroids are noted. Other: Minimal free fluid is noted in the pelvis. Changes of anasarca are noted. Musculoskeletal: Lumbar spine degenerative changes are noted without acute abnormality. IMPRESSION: Changes consistent with the recent renal biopsy with perinephric hematoma identified. No significant impingement upon the underlying native kidney is noted. No obstructive changes are seen. Nonobstructing midpole right renal stone stable from the prior exam. New bilateral effusions and lower lobe atelectasis. Mild changes of anasarca Electronically Signed   By: Mark  Lukens M.D.   On: 03/03/2020 10:50   US BIOPSY (KIDNEY)  Result Date: 03/02/2020 CLINICAL DATA:  Renal insufficiency, acute kidney injury, hypertension EXAM: ULTRASOUND GUIDED RENAL CORE BIOPSY COMPARISON:  CT 02/25/2020 TECHNIQUE: Survey ultrasound was performed and an appropriate skin entry site was localized. Markedly echogenic renal parenchyma noted bilaterally. Site was marked, prepped with Betadine, draped in usual sterile fashion, infiltrated locally with 1% lidocaine. Intravenous Fentanyl 50mcg and Versed 1mg were administered as conscious sedation during continuous monitoring of the patient's level of consciousness and physiological /  cardiorespiratory status by the radiology RN, with a total moderate sedation time of 12 minutes. Under real time ultrasound guidance, a 15 gauge trocar needle was advanced to the margin of the lower pole of the left kidney for 3 coaxial 16 gauge core biopsy needle passes. The core samples were submitted to pathology. The patient tolerated procedure well. COMPLICATIONS: None immediate IMPRESSION: 1. Technically successful ultrasound-guided core renal biopsy , left lower pole. Electronically Signed   By: D  Hassell M.D.   On: 03/02/2020 15:45    Medications:   . B-complex with vitamin C  1 tablet Oral Daily  . chlorhexidine gluconate (MEDLINE KIT)  15 mL Mouth Rinse BID  . Chlorhexidine Gluconate Cloth  6 each Topical Q0600  . Chlorhexidine Gluconate Cloth  6 each Topical Q0600  . docusate sodium  100 mg Oral BID  . feeding supplement  237 mL Oral TID BM  . levETIRAcetam  250 mg Oral Q T,Th,Sa-HD  . levETIRAcetam  500 mg Oral BID  . mouth rinse  15 mL Mouth Rinse BID  . NIFEdipine  90 mg Oral Daily  . sodium chloride flush  10-40 mL Intracatheter Q12H   Continuous Infusions: . sodium chloride Stopped (02/28/20 1701)  . cefTRIAXone (ROCEPHIN)  IV 2 g (03/03/20 0849)     LOS: 8 days   Shandel Busic, MD  [facility]   [facility] How to   contact the [facility] Attending or Consulting provider 7A - 7P or covering provider during after hours 7P -7A, for this patient?  1. Check the care team in [facility] and look for a) attending/consulting [facility] provider listed and b) the [facility] team listed 2. Log into [email or web address] and use [facility]'s universal password to access. If you do not have the password, please contact the hospital operator. 3. Locate the [facility] provider you are looking for under [facility] and page to a number that you can be directly reached. 4. If you still have difficulty reaching the provider, please page the DOC (Director on Call) for the Hospitalists listed on amion for  assistance.  03/04/2020, 7:12 AM           

## 2020-03-04 NOTE — Progress Notes (Signed)
Nutrition Follow Up  DOCUMENTATION CODES:   Not applicable  INTERVENTION:   Liberalize diet to REGULAR with fluid restriction given ongoing poor PO   Ensure Enlive po TID, each supplement provides 350 kcal and 20 grams of protein  Renal MVI daily   NUTRITION DIAGNOSIS:   Increased nutrient needs related to acute illness (renal failure requiring CRRT) as evidenced by estimated needs.  Ongoing  GOAL:   Patient will meet greater than or equal to 90% of their needs   Progressing   MONITOR:   PO intake,Supplement acceptance,Labs,Weight trends,I & O's  REASON FOR ASSESSMENT:   Consult Enteral/tube feeding initiation and management  ASSESSMENT:   65 yo female admitted with acute renal failure complicated by seizure and cardiac arrest requiring intubation on 2/23. PMH includes HTN, recent COVID infection Dec 2021.   2/24- start CRRT 2/25- extubated, stop CRRT 2/26- iHD  Will require TDC in future.   Appetite slow to progress given ongoing nausea. Last four meal completions charted as 30%, 50%, 20%, and 10%. Taking 1-2 Ensures daily and would like to continue. Recommend liberalization of diet to provide more option.   Will need outpatient HD education once feeling better.   Admission weight: 70.4 kg  Current weight: 73.9 kg   Medications: colace Labs: CBG 82-125  Diet Order:   Diet Order            Diet renal with fluid restriction Room service appropriate? Yes; Fluid consistency: Thin  Diet effective now                 EDUCATION NEEDS:   Education needs have been addressed  Skin:  Skin Assessment: Reviewed RN Assessment  Last BM:  2/25  Height:   Ht Readings from Last 1 Encounters:  02/26/20 5' 2.99" (1.6 m)    Weight:   Wt Readings from Last 1 Encounters:  03/04/20 73.9 kg    Ideal Body Weight:  52.3 kg  BMI:  Body mass index is 28.87 kg/m.  Estimated Nutritional Needs:   Kcal:  2100-2300  Protein:  135-155 grams  Fluid:  1000  ml + UOP  Mariana Single RD, LDN Clinical Nutrition Pager listed in [facility]

## 2020-03-05 ENCOUNTER — Inpatient Hospital Stay (HOSPITAL_COMMUNITY): Payer: 59

## 2020-03-05 DIAGNOSIS — R509 Fever, unspecified: Secondary | ICD-10-CM

## 2020-03-05 DIAGNOSIS — J181 Lobar pneumonia, unspecified organism: Secondary | ICD-10-CM | POA: Diagnosis not present

## 2020-03-05 DIAGNOSIS — N179 Acute kidney failure, unspecified: Secondary | ICD-10-CM | POA: Diagnosis not present

## 2020-03-05 DIAGNOSIS — A419 Sepsis, unspecified organism: Secondary | ICD-10-CM | POA: Diagnosis not present

## 2020-03-05 DIAGNOSIS — S37019A Minor contusion of unspecified kidney, initial encounter: Secondary | ICD-10-CM | POA: Diagnosis not present

## 2020-03-05 DIAGNOSIS — N059 Unspecified nephritic syndrome with unspecified morphologic changes: Secondary | ICD-10-CM | POA: Diagnosis not present

## 2020-03-05 DIAGNOSIS — N170 Acute kidney failure with tubular necrosis: Secondary | ICD-10-CM

## 2020-03-05 DIAGNOSIS — R6520 Severe sepsis without septic shock: Secondary | ICD-10-CM

## 2020-03-05 LAB — COMPREHENSIVE METABOLIC PANEL
ALT: 35 U/L (ref 0–44)
AST: 44 U/L — ABNORMAL HIGH (ref 15–41)
Albumin: 1.3 g/dL — ABNORMAL LOW (ref 3.5–5.0)
Alkaline Phosphatase: 58 U/L (ref 38–126)
Anion gap: 12 (ref 5–15)
BUN: 22 mg/dL (ref 8–23)
CO2: 22 mmol/L (ref 22–32)
Calcium: 7.2 mg/dL — ABNORMAL LOW (ref 8.9–10.3)
Chloride: 98 mmol/L (ref 98–111)
Creatinine, Ser: 5.19 mg/dL — ABNORMAL HIGH (ref 0.44–1.00)
GFR, Estimated: 9 mL/min — ABNORMAL LOW (ref >60)
Glucose, Bld: 71 mg/dL (ref 70–99)
Potassium: 4.3 mmol/L (ref 3.5–5.1)
Sodium: 132 mmol/L — ABNORMAL LOW (ref 135–145)
Total Bilirubin: 0.6 mg/dL (ref 0.3–1.2)
Total Protein: 5.5 g/dL — ABNORMAL LOW (ref 6.5–8.1)

## 2020-03-05 LAB — LACTATE DEHYDROGENASE: LDH: 297 U/L — ABNORMAL HIGH (ref 98–192)

## 2020-03-05 LAB — PROCALCITONIN: Procalcitonin: >150 ng/mL

## 2020-03-05 LAB — LACTIC ACID, PLASMA
Lactic Acid, Venous: 0.7 mmol/L (ref 0.5–1.9)
Lactic Acid, Venous: 0.9 mmol/L (ref 0.5–1.9)

## 2020-03-05 LAB — HEPATITIS B CORE ANTIBODY, IGM: Hep B C IgM: NONREACTIVE

## 2020-03-05 LAB — HEMOGLOBIN AND HEMATOCRIT, BLOOD
HCT: 24.1 % — ABNORMAL LOW (ref 36.0–46.0)
HCT: 24.6 % — ABNORMAL LOW (ref 36.0–46.0)
Hemoglobin: 7.6 g/dL — ABNORMAL LOW (ref 12.0–15.0)
Hemoglobin: 8 g/dL — ABNORMAL LOW (ref 12.0–15.0)

## 2020-03-05 LAB — CBC
HCT: 22.9 % — ABNORMAL LOW (ref 36.0–46.0)
Hemoglobin: 7.8 g/dL — ABNORMAL LOW (ref 12.0–15.0)
MCH: 29.8 pg (ref 26.0–34.0)
MCHC: 34.1 g/dL (ref 30.0–36.0)
MCV: 87.4 fL (ref 80.0–100.0)
Platelets: 222 10*3/uL (ref 150–400)
RBC: 2.62 MIL/uL — ABNORMAL LOW (ref 3.87–5.11)
RDW: 15.1 % (ref 11.5–15.5)
WBC: 18.3 10*3/uL — ABNORMAL HIGH (ref 4.0–10.5)
nRBC: 0 % (ref 0.0–0.2)

## 2020-03-05 LAB — PROTIME-INR
INR: 1.2 (ref 0.8–1.2)
Prothrombin Time: 14.7 seconds (ref 11.4–15.2)

## 2020-03-05 LAB — URINE CULTURE: Culture: NO GROWTH

## 2020-03-05 LAB — FIBRINOGEN: Fibrinogen: 776 mg/dL — ABNORMAL HIGH (ref 210–475)

## 2020-03-05 LAB — APTT: aPTT: 38 seconds — ABNORMAL HIGH (ref 24–36)

## 2020-03-05 LAB — HEPATITIS B CORE ANTIBODY, TOTAL: Hep B Core Total Ab: NONREACTIVE

## 2020-03-05 MED ORDER — VANCOMYCIN HCL IN DEXTROSE 750-5 MG/150ML-% IV SOLN: 750.0000 mg | Freq: Once | INTRAVENOUS | Status: DC

## 2020-03-05 MED ORDER — SODIUM CHLORIDE 0.9 % IV SOLN
1.0000 g | INTRAVENOUS | Status: DC
  Administered 2020-03-05: 1 g via INTRAVENOUS
  Filled 2020-03-05: qty 1

## 2020-03-05 MED ORDER — VANCOMYCIN HCL 1500 MG/300ML IV SOLN
1500.0000 mg | Freq: Once | INTRAVENOUS | Status: DC
  Administered 2020-03-05: 1500 mg via INTRAVENOUS
  Filled 2020-03-05: qty 300

## 2020-03-05 MED FILL — Medication: Qty: 1 | Status: AC

## 2020-03-05 NOTE — Progress Notes (Signed)
Pharmacy Antibiotic Note  Barbara Crawford is a 65 y.o. female to begin broader spectrum antibiotics for possible sepsis.  New HD patient.   Plan: Vancomycin 1500 mg iv x 1 now then 750 after HD sessions Follow HD schedule  Cefepime 1 gram iv Q 24 hours  Follow up cultures / plan  Height: 5' 2.99" (160 cm) Weight: 73.9 kg (162 lb 14.7 oz) IBW/kg (Calculated) : 52.38  Temp (24hrs), Avg:100.4 F (38 C), Min:98.9 F (37.2 C), Max:102 F (38.9 C)  Recent Labs  Lab 02/29/20 0352 03/01/20 0050 03/02/20 0324 03/02/20 0850 03/03/20 0543 03/03/20 0726 03/04/20 0044  WBC 14.6* 15.3*  --  12.7*  --  9.6 8.8  CREATININE 6.17* 4.31* 6.37*  --  3.90*  --  5.35*    Estimated Creatinine Clearance: 10.4 mL/min (A) (by C-G formula based on SCr of 5.35 mg/dL (H)).    Allergies  Allergen Reactions  . Erythromycin Other (See Comments)    Pt does not recall  . Iodine Other (See Comments)    Arm flares up per pt  . Penicillins     Has patient had a PCN reaction causing immediate rash, facial/tongue/throat swelling, SOB or lightheadedness with hypotension: no Has patient had a PCN reaction causing severe rash involving mucus membranes or skin necrosis: yes Has patient had a PCN reaction that required hospitalization: no Has patient had a PCN reaction occurring within the last 10 years: no If all of the above answers are "NO", then may proceed with Cephalosporin use.  [employer]     Thank you Anette Guarneri, PharmD 03/05/2020 8:33 AM

## 2020-03-05 NOTE — Procedures (Signed)
Heart rate is too high for accurate echo at this time. 

## 2020-03-05 NOTE — Evaluation (Signed)
Physical Therapy Evaluation Patient Details Name: Barbara Crawford MRN: [id number] DOB: [birth year] Today's Date: 03/05/2020   History of Present Illness  65 y.o. female with medical history significant of HTN. Pt had COVID in Dec, 1 week prior to presenting to ED 02/25/20 she developed swelling in BLE. Developed pain in B lower back; CT abd/pelvis - no obstruction or acute abnormality. HGB 6.3, +acute renal failure; 2/23 seizure and Code Blue with CPR x 4 minutes and intubated 2/23-2/25; CRRT; renal biopsy 2/28 with perinephric hematoma; pneumonia  Clinical Impression   Pt admitted with above diagnosis. 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 Pt currently requires min assist with all mobility and limited by dizziness and fatigue. Patient with the following functional limitations due to the deficits listed below: impaired gait and balance. Pt will benefit from skilled PT to increase their independence and safety with mobility to allow discharge to the venue listed below.       Follow Up Recommendations Home health PT (initial 24/7 for <1 week)    Equipment Recommendations  Rolling walker with 5" wheels;3in1 (PT)    Recommendations for Other Services OT consult     Precautions / Restrictions Precautions Precautions: Fall      Mobility  Bed Mobility Overal bed mobility: Needs Assistance Bed Mobility: Rolling;Sidelying to Sit Rolling: Supervision Sidelying to sit: Min assist       General bed mobility comments: vc for rolling to incr independence with getting OOB; min assist to help raise torso to sitting    Transfers Overall transfer level: Needs assistance Equipment used: 1 person hand held assist Transfers: Sit to/from Stand Sit to Stand: Min assist         General transfer comment: assist to steady upon standing; pt initially with very wide BOS and required steadying assist to move to normal BOS  Ambulation/Gait Ambulation/Gait assistance: Min  assist Gait Distance (Feet): 4 Feet Assistive device: 1 person hand held assist Gait Pattern/deviations: Step-to pattern;Decreased stride length;Wide base of support     General Gait Details: dizziness limited distance (not orthostatic)  Stairs            Wheelchair Mobility    Modified [place] (Stroke Patients Only)       Balance Overall balance assessment: Needs assistance Sitting-balance support: No upper extremity supported;Feet supported Sitting balance-Leahy Scale: Fair       Standing balance-Leahy Scale: Poor                               Pertinent Vitals/Pain Pain Assessment: No/denies pain    Home Living Family/patient expects to be discharged to:: Private residence Living Arrangements: Children Available Help at Discharge: Family;Available PRN/intermittently (adult children trying to arrange 24/7 initially) Type of Home: Apartment Home Access: Stairs to enter Entrance Stairs-Rails: Psychiatric nurse of Steps: 24 Home Layout: One level (2nd floor apartment) Home Equipment: None      Prior Function Level of Independence: Independent         Comments: works in Financial controller, infants, 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, newspaper        Extremity/Trunk Assessment   Upper Extremity Assessment Upper Extremity Assessment: Generalized weakness    Lower Extremity Assessment Lower Extremity Assessment: Generalized weakness    Cervical / Trunk Assessment Cervical / Trunk Assessment: Normal  Communication   Communication: Other (comment) (weak, hoarse voice)  Cognition Arousal/Alertness: Awake/alert Behavior During Therapy: WFL for tasks  assessed/performed Overall Cognitive Status: Impaired/Different from baseline Area of Impairment: Orientation                 Orientation Level: Time (knew month, not day of week)                    General Comments General comments (skin integrity, edema, etc.): HR  122-130; BP 160s/100s supine, sit and after transfer    Exercises Other Exercises Other Exercises: educated to complete ankle pumps and LAQ while seated with feet on the floor   Assessment/Plan    PT Assessment Patient needs continued PT services  PT Problem List Decreased strength;Decreased activity tolerance;Decreased balance;Decreased mobility;Decreased knowledge of use of DME       PT Treatment Interventions DME instruction;Gait training;Functional mobility training;Therapeutic activities;Therapeutic exercise;Balance training;Patient/family education    PT Goals (Current goals can be found in the Care Plan section)  Acute Rehab PT Goals Patient Stated Goal: go home and get stronger PT Goal Formulation: With patient Time For Goal Achievement: 03/19/20 Potential to Achieve Goals: Good    Frequency Min 3X/week   Barriers to discharge Decreased caregiver support patient states she will talk to her grown son and daughter about their work schedules for when she comes home    Co-evaluation               AM-PAC PT "6 Clicks" Mobility  Outcome Measure Help needed turning from your back to your side while in a flat bed without using bedrails?: None Help needed moving from lying on your back to sitting on the side of a flat bed without using bedrails?: A Little Help needed moving to and from a bed to a chair (including a wheelchair)?: A Little Help needed standing up from a chair using your arms (e.g., wheelchair or bedside chair)?: A Little Help needed to walk in hospital room?: A Little Help needed climbing 3-5 steps with a railing? : A Lot 6 Click Score: 18    End of Session Equipment Utilized During Treatment: Gait belt Activity Tolerance: Patient limited by fatigue Patient left: in chair;with call bell/phone within reach;with chair alarm set Nurse Communication: Mobility status PT Visit Diagnosis: Unsteadiness on feet (R26.81);Muscle weakness (generalized) (M62.81)     Time: [id number] PT Time Calculation (min) (ACUTE ONLY): 29 min   Charges:   PT Evaluation $PT Eval Low Complexity: 1 Low PT Treatments $Therapeutic Activity: 8-22 mins         Arby Barrette, PT Pager [phone]   [name] 03/05/2020, 10:21 AM

## 2020-03-05 NOTE — Consult Note (Addendum)
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 And from the history of it this is rather a smoldering process (GN with positive ENA ab's and unclear cause at this time) 2/23 bcx, ucx negative 3/02 ucx ngtd; bcx ngtd -Viral ID causes could be a role, but less likely in this case (hepatitis). Hep c ab negative. -Will complete hepatitis B serologic w/u.  -Hiv/rpr screen is also negative. Many other chronic infection could be associated with glomerulonephritis as well (chronic granulomatous process), so will screen for TB. Will also get contrast ct chest/abd/pelv to look for lymphadenopathy (if present can send for endemic fungi and things such as brucella/q-fever -- although no  known exposure). So far noncontrast abd ct unremarkable.  -Endocarditis is a known cause of GN. While bcx is negative, CNBE is a possibility.    I discussed with dr Marval Regal of nephrology. There is lots of scarring on prelim renal biopsy, and fsgs finding is there (atypical as nephrotic proteinuria is too high for fsgs). Patient has cr mid 1's 2 months ago so potentially could be some reversibility. Primary fsgs potentially could benefit from steroid  Given this, can wait for biopsy to return before deciding on ct with contrast   #perinephric fluid collection This occurs after the renal biopsy and after fever had already occurred. I agree unlikely infected but will keep monitoring  #seizure Brain mri unremarkable Neurology evaluated and deemed metabolic related Systemic cause of GN/fever and potential seizure a consideration  Plan: 1. F/u renal biopsy; if amenable, would like to do ct abd/pelv/chest with contrast to look for lymphadenopathy/granulomatous changes where we could potentially get tissue to r/o chronic infection or other non-ID inflammatory/malignant process 2. Check qfever, brucella, quantiferon 3. Check hep b core igm and igg 4. Check echocardiogram 5. Check LDH, repeat inr/ptt/fibrinogen 6. Would hold further antibiotics at this time 7. Repeat another 2 set blood cx if recurrent fever  Principal Problem:   Acute kidney failure (HCC) Active Problems:   Anemia   Uremia   HTN (hypertension)   Ventilator dependence (HCC)   Perinephric hematoma   Seizure (HCC)   Cardiac arrest with pulseless electrical activity (HCC)   Acute on chronic respiratory failure with hypoxia (HCC)   Acute lower UTI   Lobar pneumonia (  [place])   Sepsis ([facility]) versus SIRS due to autoimmune process   Scheduled Meds: . chlorhexidine gluconate (MEDLINE KIT)  15 mL Mouth Rinse BID  . Chlorhexidine Gluconate Cloth  6 each Topical Q0600  . Chlorhexidine Gluconate Cloth  6 each Topical Q0600  .  dextromethorphan  30 mg Oral BID  . docusate sodium  100 mg Oral BID  . feeding supplement  237 mL Oral TID BM  . levETIRAcetam  250 mg Oral Q T,Th,Sa-HD  . levETIRAcetam  500 mg Oral BID  . mouth rinse  15 mL Mouth Rinse BID  . multivitamin  1 tablet Oral QHS  . NIFEdipine  90 mg Oral Daily  . sodium chloride flush  10-40 mL Intracatheter Q12H   Continuous Infusions: . sodium chloride Stopped (02/28/20 1701)  . ceFEPime (MAXIPIME) IV 1 g (03/05/20 1119)  . vancomycin 1,500 mg (03/05/20 1352)   PRN Meds:.sodium chloride, acetaminophen, antiseptic oral rinse, guaiFENesin, labetalol, LORazepam, melatonin, ondansetron **OR** ondansetron (ZOFRAN) IV, oxyCODONE, sodium chloride flush  HPI: Barbara Crawford is a 65 y.o. [place] American female hypertension, recent mild covid infection, admitted for weeks of progressive fatigue and LE edema found to have nephrotic syndrome/aki course complicated by fever  Patient not previously vaccinated for covid 01/2020 developed mild covid not needing hospital admission. Runny nose and fatigue main sx. She recovered but experiences progressive fatigue. Appetite intact althought not great. No f/c/nightsweat. Had some weight loss initially but improved after recovery. The week prior to admission has bilateral LE edema also She was noted to have gradual increase cr since 01/2020  She was found to have aki requiring hd as below. And during this admission developed fever despite treatment with bsAbx... see more below  She denies headache, cough, chest pain, sob, abd pain, n/v/diarrhea She denies rash, joint pain, muscle ache, oral ulcer, vaginal ulcer, blurry vision, numbness/tingling/focal weakness  Renal consulting and had biopsied her kidney. There is plan for potential immunosuppression once ID causes r/o  Overall she feels the same since admission. Still fatigue and LE edema. 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 2/24 cxr no acute pulmonary process. Started on ceftriaxone 2/27  lft 103/54/60/0.3 2/28 renal biopsy done. Rheumatologic w/u with positive ssa, rnp, scl-70 antibody and ANA (no titer). Rpr, hep b sAg, hep c ab, hiv screen negative. aso screen negative 3/03 still feels weak. No supplemental o2. Appetite stable. Repeat bcx ngtd lft 44/35/58/0.6 procalcitonin >150  Wbc trend/fever curve (tmax): 3/03  18       /        100.3 3/02    9       /        102 3/01    9       /        101.5 2/28   13      /        afebrile 2/27   15      /        afebrile 2/25  8       /        101.8 2/23    8       /        afebrile 2/22    8       /        afebrile   Review of Systems: ROS Other ros negative  Past Medical History:  Diagnosis Date  . Hypertension     Social History   Tobacco Use  . Smoking status: Never Smoker  Substance Use Topics  . Alcohol use: Yes  . Drug use: No    fam hx: She doesn't know much about Mother with dm2  Allergies  Allergen Reactions  . Erythromycin Other (See Comments)    Pt does not recall  . Iodine Other (See Comments)    Arm flares up per pt  . Penicillins     Has patient had a PCN reaction causing immediate rash, facial/tongue/throat swelling, SOB or lightheadedness with hypotension: no Has patient had a PCN reaction causing severe rash involving mucus membranes or skin necrosis: yes Has patient had a PCN reaction that required hospitalization: no Has patient had a PCN reaction occurring within the last 10 years:  no If all of the above answers are "NO", then may proceed with Cephalosporin use.  Marland Kitchen [employer]     OBJECTIVE: Blood pressure (!) 152/89, pulse (!) 115, temperature 100.3 F (37.9 C), temperature source Oral, resp. rate 20, height 5' 2.99" (1.6 m), weight 73.9 kg, SpO2 95 %.  Physical Exam Sleepy appear female, sitting in chair, no distress, conversant Heent: per; conj clear; eomi; oropharynx clear Neck right ij site hd catheter no tenderness/purulence/redness Lymph: no cervical/axillary/inguinal adenopathy cv rrr no mrg Lungs clear abd s/nt Ext 2-3+ edema bilateral LE to above knee Skin no rash msk no peripheral joint swelling/tenderness/warmth Neuro cn2-12 intact; strength reflex intact Psych alert/oriented   Lab Results Lab Results  Component Value Date   WBC 18.3 (H) 03/05/2020   HGB 7.8 (L) 03/05/2020   HCT 22.9 (L) 03/05/2020   MCV 87.4 03/05/2020   PLT 222 03/05/2020    Lab Results  Component Value Date   CREATININE 5.19 (H) 03/05/2020   BUN 22 03/05/2020   NA 132 (L) 03/05/2020   K 4.3 03/05/2020   CL 98 03/05/2020   CO2 22 03/05/2020    Lab Results  Component Value Date   ALT 35 03/05/2020   AST 44 (H) 03/05/2020   ALKPHOS 58 03/05/2020   BILITOT 0.6 03/05/2020     Microbiology: Recent Results (from the past 240 hour(s))  Resp Panel by RT-PCR (Flu A&B, Covid) Nasopharyngeal Swab     Status: None   Collection Time: 02/25/20  6:12 PM   Specimen: Nasopharyngeal Swab; Nasopharyngeal(NP) swabs in vial transport medium  Result Value Ref Range Status   SARS Coronavirus 2 by RT PCR NEGATIVE NEGATIVE Final    Comment: (NOTE) SARS-CoV-2 target nucleic acids are NOT DETECTED.  The SARS-CoV-2 RNA is generally detectable in upper respiratory specimens during the acute phase of infection. The lowest concentration of SARS-CoV-2 viral copies this assay can detect is 138 copies/mL. A negative result does not preclude SARS-Cov-2 infection and  should not be used as the sole basis for treatment or other patient management decisions. A negative result may occur with  improper specimen collection/handling, submission of specimen other than nasopharyngeal swab, presence of viral mutation(s) within the areas targeted by this assay, and inadequate  number of viral copies(<138 copies/mL). A negative result must be combined with clinical observations, patient history, and epidemiological information. The expected result is Negative.  Fact Sheet for Patients:  EntrepreneurPulse.com.au  Fact Sheet for Healthcare Providers:  IncredibleEmployment.be  This test is no t yet approved or cleared by the [place] FDA and  has been authorized for detection and/or diagnosis of SARS-CoV-2 by FDA under an Emergency Use Authorization (EUA). This EUA will remain  in effect (meaning this test can be used) for the duration of the COVID-19 declaration under Section 564(b)(1) of the Act, 21 U.S.C.section 360bbb-3(b)(1), unless the authorization is terminated  or revoked sooner.       Influenza A by PCR NEGATIVE NEGATIVE Final   Influenza B by PCR NEGATIVE NEGATIVE Final    Comment: (NOTE) The Xpert Xpress SARS-CoV-2/FLU/RSV plus assay is intended as an aid in the diagnosis of influenza from Nasopharyngeal swab specimens and should not be used as a sole basis for treatment. Nasal washings and aspirates are unacceptable for Xpert Xpress SARS-CoV-2/FLU/RSV testing.  Fact Sheet for Patients: EntrepreneurPulse.com.au  Fact Sheet for Healthcare Providers: IncredibleEmployment.be  This test is not yet approved or cleared by the [place] FDA and has been authorized for detection and/or diagnosis of SARS-CoV-2 by FDA under an Emergency Use Authorization (EUA). This EUA will remain in effect (meaning this test can be used) for the duration of the COVID-19 declaration  under Section 564(b)(1) of the Act, 21 U.S.C. section 360bbb-3(b)(1), unless the authorization is terminated or revoked.  Performed at [facility], [place] [address] [place], [place] [address]   MRSA PCR Screening     Status: None   Collection Time: 02/26/20  4:00 PM   Specimen: Nasopharyngeal  Result Value Ref Range Status   MRSA by PCR NEGATIVE NEGATIVE Final    Comment:        The GeneXpert MRSA Assay (FDA approved for NASAL specimens only), is one component of a comprehensive MRSA colonization surveillance program. It is not intended to diagnose MRSA infection nor to guide or monitor treatment for MRSA infections. Performed at [facility], [place] [address] [place], [place] [address]   Culture, Urine     Status: None   Collection Time: 02/26/20  5:08 PM   Specimen: Urine, Random  Result Value Ref Range Status   Specimen Description URINE, RANDOM  Final   Special Requests NONE  Final   Culture   Final    NO GROWTH Performed at [facility], [place] [address], [place], [place] [address]    Report Status 02/27/2020 FINAL  Final  Culture, blood (routine x 2)     Status: None   Collection Time: 02/26/20 10:43 PM   Specimen: BLOOD  Result Value Ref Range Status   Specimen Description BLOOD SITE NOT SPECIFIED  Final   Special Requests   Final    AEROBIC BOTTLE ONLY Blood Culture results may not be optimal due to an inadequate volume of blood received in culture bottles   Culture   Final    NO GROWTH 5 DAYS Performed at [facility], [place] [address], [place], [place] [address]    Report Status 03/02/2020 FINAL  Final  Culture, blood (routine x 2)     Status: None   Collection Time: 02/26/20 10:43 PM   Specimen: BLOOD  Result Value Ref Range Status   Specimen Description BLOOD SITE NOT SPECIFIED  Final   Special Requests AEROBIC  BOTTLE ONLY Blood Culture adequate volume  Final   Culture   Final    NO GROWTH 5 DAYS Performed at [facility], [place] [address], [place], [place] [address]    Report Status 03/02/2020 FINAL  Final  Urine Culture     Status: None   Collection Time: 03/04/20  5:30 AM   Specimen: Urine, Random  Result Value Ref Range Status   Specimen Description URINE, RANDOM  Final   Special Requests STERILE CUP  Final   Culture   Final    NO GROWTH Performed at [facility], [place] [address], [place], [place] [address]    Report Status 03/05/2020 FINAL  Final  Culture, blood (routine x 2)     Status: None (Preliminary result)   Collection Time: 03/04/20  8:48 PM   Specimen: BLOOD  Result Value Ref Range Status   Specimen Description BLOOD LEFT ANTECUBITAL  Final   Special Requests   Final    BOTTLES DRAWN AEROBIC AND ANAEROBIC Blood Culture results may not be optimal due to an inadequate volume of blood received in culture bottles   Culture   Final    NO GROWTH < 24 HOURS Performed at [facility], [place] [address], [place], [place] [address]    Report Status PENDING  Incomplete  Culture, blood (routine x 2)     Status: None (Preliminary result)   Collection Time: 03/04/20  8:48 PM   Specimen: BLOOD LEFT HAND  Result Value Ref Range Status   Specimen Description BLOOD LEFT HAND  Final   Special Requests   Final    BOTTLES DRAWN AEROBIC AND ANAEROBIC Blood Culture results may not be optimal due to an inadequate volume of blood received in culture bottles   Culture   Final    NO GROWTH < 24 HOURS Performed at [facility], [place] [address] [place], [place] [address]    Report Status PENDING  Incomplete     Serology:   Imaging: If present, new imagings (plain films, ct scans, and mri) have been personally visualized and interpreted; radiology reports have been reviewed. Decision making incorporated into the Impression / Recommendations.  3/01 abd pelv ct Changes consistent with the recent renal biopsy with perinephric hematoma identified. No significant impingement upon the  underlying native kidney is noted. No obstructive changes are seen. Nonobstructing midpole right renal stone stable from the prior exam. New bilateral effusions and lower lobe atelectasis. Mild changes of anasarca  2/27 cxr Stable mild cardiomegaly and diffuse interstitial infiltrates. Stable left lower lobe atelectasis versus consolidation, and probable small left pleural effusion  2/24 ct renal protocol 1. No acute intra-abdominal process. 2. Nonobstructive right nephrolithiasis  2/24 mri brain 1. Symmetric patchy and indistinct central cerebral white matter diffusion restriction, nonspecific. Perhaps this is sequelae of Uremia or other metabolic derangement. COVID-19 related demyelination is possible, although there is no corresponding T2/FLAIR abnormality. Sequelae of seizure activity was also considered but felt unlikely. 2. Otherwise largely unremarkable for age noncontrast MRI appearance of the brain, ordinary scattered subcortical white matter signal changes most commonly due to small vessel disease. 3. Intracranial MRA is negative aside from generalized arterial Tortuosity  2/23 cxr 1. New right IJ Vas-Cath with tip terminating in the right atrium. No pneumothorax or other acute complicating features. 2. Mild cardiomegaly  Jabier Mutton, [place] [facility] [phone] pager    03/05/2020, 2:06 PM

## 2020-03-05 NOTE — Progress Notes (Signed)
Patient ID: Barbara Crawford, female   DOB: Sep 29, 1955, 65 y.o.   MRN: [id number] S: Feeling a little better. O:BP (!) 152/89   Pulse (!) 115   Temp 100.3 F (37.9 C) (Oral)   Resp 20   Ht 5' 2.99" (1.6 m)   Wt 73.9 kg   SpO2 95%   BMI 28.87 kg/m   Intake/Output Summary (Last 24 hours) at 03/05/2020 1024 Last data filed at 03/04/2020 1155 Gross per 24 hour  Intake --  Output 433 ml  Net -433 ml   Intake/Output: I/O last 3 completed shifts: In: -  Out: 433 [Other:433]  Intake/Output this shift:  No intake/output data recorded. Weight change:  Gen: NAD CVS: Tachy at 115 Resp: CTA Abd: +BS, soft, NT/ND Ext: no edema  Recent Labs  Lab 02/27/20 1600 02/28/20 0501 02/28/20 1556 02/29/20 0352 03/01/20 0050 03/02/20 0324 03/03/20 0543 03/04/20 0044 03/05/20 0807  NA 136 136 134* 130* 135 135 135 135 132*  K 4.2 5.0 4.4 4.6 4.0 4.3 4.0 3.9 4.3  CL 102 100 100 98 97* 99 98 99 98  CO2 22 24 24  21* 26 25 24 23 22   GLUCOSE 110* 115* 101* 93 101* 91 87 125* 71  BUN 53* 36* 30* 35* 18 33* 16 24* 22  CREATININE 10.90* 6.62* 5.20* 6.17* 4.31* 6.37* 3.90* 5.35* 5.19*  ALBUMIN 1.7* 1.6* 1.6* 1.6* 1.4* 1.4* 1.3* 1.4* 1.3*  CALCIUM 7.4* 7.4* 7.2* 7.2* 8.4* 7.7* 8.3* 7.7* 7.2*  PHOS 5.0* 3.6 3.4 3.1 3.7  --  3.0 3.7  --   AST  --   --   --   --  103* 70*  --   --  44*  ALT  --   --   --   --  54* 53*  --   --  35   Liver Function Tests: Recent Labs  Lab 03/01/20 0050 03/02/20 0324 03/03/20 0543 03/04/20 0044 03/05/20 0807  AST 103* 70*  --   --  44*  ALT 54* 53*  --   --  35  ALKPHOS 60 57  --   --  58  BILITOT 0.3 0.5  --   --  0.6  PROT 5.8* 5.7*  --   --  5.5*  ALBUMIN 1.4* 1.4* 1.3* 1.4* 1.3*   No results for input(s): LIPASE, AMYLASE in the last 168 hours. No results for input(s): AMMONIA in the last 168 hours. CBC: Recent Labs  Lab 03/01/20 0050 03/02/20 0850 03/03/20 0726 03/03/20 1918 03/04/20 0044 03/04/20 0740 03/05/20 0002 03/05/20 0721  03/05/20 0807  WBC 15.3* 12.7* 9.6  --  8.8  --   --   --  18.3*  NEUTROABS 13.1*  --   --   --   --   --   --   --   --   HGB 7.8* 7.9* 6.3*   < > 7.9*   < > 8.0* 7.6* 7.8*  HCT 23.2* 25.0* 20.2*   < > 24.4*   < > 24.6* 24.1* 22.9*  MCV 86.2 88.0 87.8  --  88.7  --   --   --  87.4  PLT 176 237 210  --  223  --   --   --  222   < > = values in this interval not displayed.   Cardiac Enzymes: No results for input(s): CKTOTAL, CKMB, CKMBINDEX, TROPONINI in the last 168 hours. CBG: Recent Labs  Lab 02/28/20 2303 02/29/20  0308 02/29/20 0832 02/29/20 1114 03/02/20 1028  GLUCAP 87 84 97 115* 82    Iron Studies: No results for input(s): IRON, TIBC, TRANSFERRIN, FERRITIN in the last 72 hours. Studies/Results: No results found. . chlorhexidine gluconate (MEDLINE KIT)  15 mL Mouth Rinse BID  . Chlorhexidine Gluconate Cloth  6 each Topical Q0600  . Chlorhexidine Gluconate Cloth  6 each Topical Q0600  . dextromethorphan  30 mg Oral BID  . docusate sodium  100 mg Oral BID  . feeding supplement  237 mL Oral TID BM  . levETIRAcetam  250 mg Oral Q T,Th,Sa-HD  . levETIRAcetam  500 mg Oral BID  . mouth rinse  15 mL Mouth Rinse BID  . multivitamin  1 tablet Oral QHS  . NIFEdipine  90 mg Oral Daily  . sodium chloride flush  10-40 mL Intracatheter Q12H    BMET    Component Value Date/Time   NA 132 (L) 03/05/2020 0807   K 4.3 03/05/2020 0807   CL 98 03/05/2020 0807   CO2 22 03/05/2020 0807   GLUCOSE 71 03/05/2020 0807   BUN 22 03/05/2020 0807   CREATININE 5.19 (H) 03/05/2020 0807   CALCIUM 7.2 (L) 03/05/2020 0807   CALCIUM 6.2 (LL) 02/26/2020 0305   GFRNONAA 9 (L) 03/05/2020 0807   GFRAA >60 01/28/2016 1543   CBC    Component Value Date/Time   WBC 18.3 (H) 03/05/2020 0807   RBC 2.62 (L) 03/05/2020 0807   HGB 7.8 (L) 03/05/2020 0807   HCT 22.9 (L) 03/05/2020 0807   PLT 222 03/05/2020 0807   MCV 87.4 03/05/2020 0807   MCH 29.8 03/05/2020 0807   MCHC 34.1 03/05/2020 0807   RDW  15.1 03/05/2020 0807   LYMPHSABS 0.8 03/01/2020 0050   MONOABS 1.2 (H) 03/01/2020 0050   EOSABS 0.0 03/01/2020 0050   BASOSABS 0.1 03/01/2020 0050     Assessment/Plan:  1. 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 Appears was brewing in 12/2019 with PCP referring her to nephrology then, Cr was 1.8 at that time. Hep C neg, hep B s antigen neg. nontunneled catheter on 02/25/20. UA with >300 mg/dl protein and 0-5 RBC. ANA positive.ANCA negative. Complement normal. RPR nonreactive. Anti-GBM neg. ASO normal. SPEP no M spike. Free light chain ratio normal. Started on CRRT on 2/24 after nontunneled catheter with IR.IHD on 02/29/20. 1. S/prenal biopsy2/28/22 and awaiting preliminary result 2. S/p 1stHD3/1/22 and 2nd3/2/22 and plan for 3rd tomorrow (no heparin with any treatment due to kidney biopsy) 3. Will need permanent access given oliguria and ongoing need for HD. 2. PEA arrest after temp HD catheter placed. Resolved and extubated. 3. HTN - ACEi on hold due to AKI 4. Vascular access- will need TDC and avf/avg placement this hospitalization if renal function does not recover soon. 1. S/p temp HD cath on 02/25/20 2. Will consult [facility]VS 5. Anemia - improved s/p transfusion 6. Seizure activity - neurology consulted and felt to be related to metabolic derangements. On keppra per neuro. 7. CKD-MBD - stable 8. Nephrotic syndrome - biopsy results pending   Donetta Potts, MD [facility] [phone]

## 2020-03-05 NOTE — Evaluation (Signed)
Occupational Therapy Evaluation Patient Details Name: Barbara Crawford MRN: [id number] DOB: [birth year] Today's Date: 03/05/2020    History of Present Illness 65 y.o. female with medical history significant of HTN. Pt had COVID in Dec, 1 week prior to presenting to ED 02/25/20 she developed swelling in BLE. Developed pain in B lower back; CT abd/pelvis - no obstruction or acute abnormality. HGB 6.3, +acute renal failure; 2/23 seizure and Code Blue with CPR x 4 minutes and intubated 2/23-2/25; CRRT; renal biopsy 2/28 with perinephric hematoma; pneumonia   Clinical Impression   PTA patient was living with her adult children in a private residence and was grossly independent with ADLs/IADLs including working at a daycare. Patient currently functioning below baseline requiring Min A to Mod A grossly for all ADLs and Min A for ADL transfers and functional mobility with use of RW. Patient also limited by deficits listed below indicating continued need for acute OT services to maximize safety and independence with self-care tasks in prep for safe d/c home with recommendation for HHOT and initial 24hr supervision/assist.     Follow Up Recommendations  Home health OT;Supervision/Assistance - 24 hour    Equipment Recommendations  3 in 1 bedside commode;Tub/shower seat    Recommendations for Other Services       Precautions / Restrictions Precautions Precautions: Fall Precaution Comments: Monitor HR Restrictions Weight Bearing Restrictions: No      Mobility Bed Mobility Overal bed mobility: Needs Assistance             General bed mobility comments: Patient seated in recliner upon entry.    Transfers Overall transfer level: Needs assistance Equipment used: Rolling walker (2 wheeled) Transfers: Sit to/from Stand Sit to Stand: Min assist         General transfer comment: Min A for power negotiation from low recliner with cues for hand placement.    Balance Overall balance  assessment: Needs assistance Sitting-balance support: No upper extremity supported;Feet supported Sitting balance-Leahy Scale: Fair     Standing balance support: Bilateral upper extremity supported Standing balance-Leahy Scale: Poor Standing balance comment: Relinat on BUE support on RW.                           ADL either performed or assessed with clinical judgement   ADL Overall ADL's : Needs assistance/impaired     Grooming: Min guard;Standing Grooming Details (indicate cue type and reason): Patient completes hand hygiene standing at sink level with Min guard for safety and line management.             Lower Body Dressing: Minimal assistance;Sit to/from stand   Toilet Transfer: Minimal Insurance claims handler Details (indicate cue type and reason): Simulated with transfer to recliner and use of RW.         Functional mobility during ADLs: Minimal assistance;Rolling walker General ADL Comments: Patient limited by decreased cognition, generalized weakness, and decreased activity tolerance.     Vision Baseline Vision/History: Wears glasses Wears Glasses: Reading only Patient Visual Report: No change from baseline       Perception     Praxis      Pertinent Vitals/Pain Pain Assessment: Faces Faces Pain Scale: Hurts a little bit Pain Location: Sternum Pain Descriptors / Indicators: Sore Pain Intervention(s): Monitored during session     Hand Dominance Right   Extremity/Trunk Assessment Upper Extremity Assessment Upper Extremity Assessment: Generalized weakness   Lower Extremity Assessment Lower Extremity Assessment: Generalized weakness  Cervical / Trunk Assessment Cervical / Trunk Assessment: Normal   Communication Communication Communication: Other (comment) (weak, hoarse voice)   Cognition Arousal/Alertness: Awake/alert Behavior During Therapy: WFL for tasks assessed/performed Overall Cognitive Status: Impaired/Different from  baseline Area of Impairment: Memory                               General Comments: Patient unable to call events since hospital admission.   General Comments  Max HR 133 during grooming standing at sink level. HR in 80-90's at rest.    Exercises     Shoulder Instructions      Home Living Family/patient expects to be discharged to:: Private residence Living Arrangements: Children Available Help at Discharge: Family;Available PRN/intermittently (adult children trying to arrange 24/7 initially) Type of Home: Apartment Home Access: Stairs to enter Entrance Stairs-Number of Steps: 24 Entrance Stairs-Rails: Right;Left Home Layout: One level (2nd floor apartment)     Bathroom Shower/Tub: Occupational psychologist: Standard     Home Equipment: None          Prior Functioning/Environment Level of Independence: Independent        Comments: works in [employer], infants, 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 OT Goal Formulation: With patient Time For Goal Achievement: 03/19/20 Potential to Achieve Goals: Good ADL Goals Pt Will Perform Grooming: standing;with supervision Pt Will Perform Lower Body Dressing: with supervision;sit to/from stand Pt Will Transfer to Toilet: ambulating;with supervision Pt Will Perform Toileting - Clothing Manipulation and hygiene: with supervision;sit to/from stand Pt Will Perform Tub/Shower Transfer: with supervision;3 in 1 Pt/caregiver will Perform Home Exercise Program: Both right  and left upper extremity;Increased strength;With written HEP provided  OT Frequency: Min 2X/week   Barriers to D/C:            Co-evaluation              AM-PAC OT "6 Clicks" Daily Activity     Outcome Measure Help from another person eating meals?: A Little Help from another person taking care of personal grooming?: A Little Help from another person toileting, which includes using toliet, bedpan, or urinal?: A Little Help from another person bathing (including washing, rinsing, drying)?: A Lot Help from another person to put on and taking off regular upper body clothing?: A Little Help from another person to put on and taking off regular lower body clothing?: A Lot 6 Click Score: 16   End of Session Equipment Utilized During Treatment: Rolling walker Nurse Communication: Mobility status  Activity Tolerance: Patient tolerated treatment well Patient left: in chair;with call bell/phone within reach;with chair alarm set  OT Visit Diagnosis: Unsteadiness on feet (R26.81);Muscle weakness (generalized) (M62.81)                Time: [id number] OT Time Calculation (min): 26 min Charges:  OT General Charges $OT Visit: 1 Visit OT Evaluation $OT Eval Moderate Complexity: 1 Mod OT Treatments $Self Care/Home Management : 8-22 mins  Barbara Crawford H. OTR/L Supplemental OT, Department of rehab services [phone]  Barbara Crawford R H. 03/05/2020, 3:01 PM

## 2020-03-05 NOTE — Progress Notes (Addendum)
Progress Note    Barbara Crawford  GYB:[id number] DOB: [birth year]  DOA: 02/25/2020 PCP: Vonna Drafts, FNP    Brief Narrative:   Chief complaint: Follow-up acute renal failure  Medical records reviewed and are as summarized below:  Barbara Crawford is an 65 y.o. female with a PMH of hypertension, prior Covid infection in December who was admitted 02/25/2020 with a chief complaint of a 1 week history of swelling in her lower extremities associated with lower back pain, malaise and fatigue.  In the ED, she was found to have a hemoglobin of 6.3, potassium 5.4, sodium 130, bicarb 9, BUN 144, and creatinine 29.98.  CT of the abdomen and pelvis was negative for obstruction or acute abnormality.  Temporary hemodialysis catheter was placed by IR and subsequently the patient had a PEA arrest associated with seizure-like activity.  She was intubated and subsequently extubated 12/28/2019.  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  Renal  biopsy done 03/03/2019.  ANA positive.  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  Continues to receive HD by nephrology.  Assessment/Plan:   Principal problem:  Acute Kidney Injury, POA Patient presented with elevation of BUN and creatinine of unclear etiology.  Renal US showed echogenic kidneys. CT renal stone study showed non-obstructive right nephrolithiasis. 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 ANA positive, no M spike detected on protein electrophoresis, ASO negative, C 3 117, C 4 35, ANCA negative, GBM antibody negative.  RPR non-reactive. Double stranded DNA negative, SCL 70+, SS a IgG more than 8+, chromatin antibody positive.  Status post renal biopsy 03/02/2020.  Developed a perinephric hematoma post biopsy.  Continue hemodialysis per nephrology.  Will need vascular access if renal function does not recover.  Vascular surgery consulted.  Still awaiting biopsy results. Continue HD per nephrology.  Active problems: Perinephric  hematoma Monitor hemoglobin closely.  Hemoglobin has been stable, will discontinue every 8 hours H&H checks.  Acute Hypoxic Respiratory Failure status post PEA arrest, resolved Occurred in the setting of cardiac arrest.  Extubated 12/27/2020.    Seizure Patient had seizure with loss of pulse and PEA arrest, status post CPR. Patient was subsequently evaluated by neurology who restarted patient on Keppra.  Plan is to discharge patient on Keppra 500 mg twice daily.  Hypertension, chronic Continue with nifedipine and labetalol as needed.  Intermittently hypertensive.  Normocytic anemia/anemia of chronic disease/acute blood loss anemia Developed acute onset anemia status post kidney biopsy from perinephric hematoma.  Received 2 units of PRBCs 03/03/2020.  Transfuse for hemoglobin less than 7.  Continue Aranesp per nephrology.  Hemoglobin 7.8 today.  Sepsis versus SIRS due to autoimmune process Differential broad: UTI, LLL pneumonia, infected hematoma versus autoimmune inflammatory process.  Will ask ID to weigh in given that she has completed a course of antibiotics and is now spiking fevers/tachypneic/hypoxic which is worrisome for ongoing lung infection.  Continues to report cough productive of yellow sputum.  Procalcitonin VERY high, but lactic acid not elevated.  WBC 13.3.   UTI Continues to have fever, UA with more than 50 WBC. Blood cultures/urine cultures repeated overnight. Broaden antibiotics.  Left lower lobar pneumonia Stable chest x-ray 03/01/20.  WBC has normalized, still complaining of cough, and has a fever today.  Fever may be related to autoimmune process.  Continue Delsym for cough. Broaden antibiotics to Cefepime/Vancomycin. WBC bump to 18.3.  Check sputum cultures. F/U repeat blood cultures. Lactic acid reassuring at 0.9, but procalcitonin >150 (although may be increased,  in part, due to ESRD).   Nutritional status Nutrition Problem: Increased nutrient needs Etiology: acute  illness (renal failure requiring CRRT) Signs/Symptoms: estimated needs Interventions: Ensure Enlive (each supplement provides 350kcal and 20 grams of protein),MVI,Liberalize Diet  Body mass index is 28.87 kg/m.   Family Communication/Anticipated D/C date and plan/Code Status   DVT prophylaxis: SCDs Start: 02/25/20 2009   Current Level of Care::  Telemetry Medical Code Status: Full Code.  Family Communication: No family at the bedside. Disposition Plan: Status is: Inpatient  Remains inpatient appropriate because:Inpatient level of care appropriate due to severity of illness   Dispo: The patient is from: Home              Anticipated d/c is to: Home with PT.              Patient currently is not medically stable to d/c.   Difficult to place patient No    Medical Consultants:    Nephrology  Interventional Radiology  Neurology  Critical Care   Anti-Infectives:    Rocephin 2 g daily 02/29/2020--->03/05/20  Cefepime 03/05/20--->  Vancomycin 03/05/20--->   Subjective:   Remained febrile overnight. Sitting up in chair, still reports cough though Delsym helped her sleep through the night. Complains of sore throat.    Objective:    Vitals:   03/05/20 0112 03/05/20 0410 03/05/20 0743 03/05/20 0800  BP:  (!) 166/110 (!) 154/99 (!) 152/89  Pulse:  (!) 115 (!) 117 (!) 115  Resp:  18 (!) 23 20  Temp: 98.9 F (37.2 C)  100.3 F (37.9 C)   TempSrc: Oral  Oral   SpO2:  92% (!) 89% 95%  Weight:      Height:       No intake or output data in the 24 hours ending 03/05/20 1332 Filed Weights   02/29/20 1624 03/04/20 0847 03/04/20 1155  Weight: 74.8 kg 74.7 kg 73.9 kg    Exam: General: No acute distress. Sitting up in chair. Cardiovascular: Heart sounds are regular, tachycardic. No gallops or rubs. 2/6  murmur. No JVD. Lungs: Decreased breath sounds. No rales, rhonchi or wheezes. Abdomen: Soft, nontender, nondistended with normal active bowel sounds. No masses. No  hepatosplenomegaly. Neurological: Alert and oriented 3. Moves all extremities 4 with equal strength. Cranial nerves II through XII grossly intact. Skin: Warm and dry. No rashes or lesions. Extremities: No clubbing or cyanosis. Trace edema. Pedal pulses 2+. Psychiatric: Mood and affect are depresedd. Insight and judgment are fair.      Data Reviewed:   I have personally reviewed following labs and imaging studies:  Labs: Labs show the following:   Basic Metabolic Panel: Recent Labs  Lab 02/28/20 0501 02/28/20 1556 02/29/20 0352 03/01/20 0050 03/02/20 0324 03/03/20 0543 03/04/20 0044 03/05/20 0807  NA 136 134* 130* 135 135 135 135 132*  K 5.0 4.4 4.6 4.0 4.3 4.0 3.9 4.3  CL 100 100 98 97* 99 98 99 98  CO2 24 24 21* _0 GLUCOSE 115* 101* 93 101* 91 87 125* 71  BUN 36* 30* 35* 18 33* 16 24* 22  CREATININE 6.62* 5.20* 6.17* 4.31* 6.37* 3.90* 5.35* 5.19*  CALCIUM 7.4* 7.2* 7.2* 8.4* 7.7* 8.3* 7.7* 7.2*  MG 2.3  --  2.1  --   --   --   --   --   PHOS 3.6 3.4 3.1 3.7  --  3.0 3.7  --    GFR Estimated Creatinine Clearance:  10.7 mL/min (A) (by C-G formula based on SCr of 5.19 mg/dL (H)). Liver Function Tests: Recent Labs  Lab 03/01/20 0050 03/02/20 0324 03/03/20 0543 03/04/20 0044 03/05/20 0807  AST 103* 70*  --   --  44*  ALT 54* 53*  --   --  35  ALKPHOS 60 57  --   --  58  BILITOT 0.3 0.5  --   --  0.6  PROT 5.8* 5.7*  --   --  5.5*  ALBUMIN 1.4* 1.4* 1.3* 1.4* 1.3*    CBC: Recent Labs  Lab 03/01/20 0050 03/02/20 0850 03/03/20 0726 03/03/20 1918 03/04/20 0044 03/04/20 0740 03/04/20 1510 03/05/20 0002 03/05/20 0721 03/05/20 0807  WBC 15.3* 12.7* 9.6  --  8.8  --   --   --   --  18.3*  NEUTROABS 13.1*  --   --   --   --   --   --   --   --   --   HGB 7.8* 7.9* 6.3*   < > 7.9* 7.8* 8.1* 8.0* 7.6* 7.8*  HCT 23.2* 25.0* 20.2*   < > 24.4* 24.2* 24.9* 24.6* 24.1* 22.9*  MCV 86.2 88.0 87.8  --  88.7  --   --   --   --  87.4  PLT 176 237 210  --   223  --   --   --   --  222   < > = values in this interval not displayed.   CBG: Recent Labs  Lab 02/28/20 2303 02/29/20 0308 02/29/20 0832 02/29/20 1114 03/02/20 1028  GLUCAP 87 84 97 115* 82    Microbiology Recent Results (from the past 240 hour(s))  Resp Panel by RT-PCR (Flu A&B, Covid) Nasopharyngeal Swab     Status: None   Collection Time: 02/25/20  6:12 PM   Specimen: Nasopharyngeal Swab; Nasopharyngeal(NP) swabs in vial transport medium  Result Value Ref Range Status   SARS Coronavirus 2 by RT PCR NEGATIVE NEGATIVE Final    Comment: (NOTE) SARS-CoV-2 target nucleic acids are NOT DETECTED.  The SARS-CoV-2 RNA is generally detectable in upper respiratory specimens during the acute phase of infection. The lowest concentration of SARS-CoV-2 viral copies this assay can detect is 138 copies/mL. A negative result does not preclude SARS-Cov-2 infection and should not be used as the sole basis for treatment or other patient management decisions. A negative result may occur with  improper specimen collection/handling, submission of specimen other than nasopharyngeal swab, presence of viral mutation(s) within the areas targeted by this assay, and inadequate number of viral copies(<138 copies/mL). A negative result must be combined with clinical observations, patient history, and epidemiological information. The expected result is Negative.  Fact Sheet for Patients:  EntrepreneurPulse.com.au  Fact Sheet for Healthcare Providers:  IncredibleEmployment.be  This test is no t yet approved or cleared by the [place] FDA and  has been authorized for detection and/or diagnosis of SARS-CoV-2 by FDA under an Emergency Use Authorization (EUA). This EUA will remain  in effect (meaning this test can be used) for the duration of the COVID-19 declaration under Section 564(b)(1) of the Act, 21 U.S.C.section 360bbb-3(b)(1), unless the authorization  is terminated  or revoked sooner.       Influenza A by PCR NEGATIVE NEGATIVE Final   Influenza B by PCR NEGATIVE NEGATIVE Final    Comment: (NOTE) The Xpert Xpress SARS-CoV-2/FLU/RSV plus assay is intended as an aid in the diagnosis of influenza from Nasopharyngeal swab  specimens and should not be used as a sole basis for treatment. Nasal washings and aspirates are unacceptable for Xpert Xpress SARS-CoV-2/FLU/RSV testing.  Fact Sheet for Patients: EntrepreneurPulse.com.au  Fact Sheet for Healthcare Providers: IncredibleEmployment.be  This test is not yet approved or cleared by the [place] FDA and has been authorized for detection and/or diagnosis of SARS-CoV-2 by FDA under an Emergency Use Authorization (EUA). This EUA will remain in effect (meaning this test can be used) for the duration of the COVID-19 declaration under Section 564(b)(1) of the Act, 21 U.S.C. section 360bbb-3(b)(1), unless the authorization is terminated or revoked.  Performed at [facility], [place] [address], [place], [place] [address]   MRSA PCR Screening     Status: None   Collection Time: 02/26/20  4:00 PM   Specimen: Nasopharyngeal  Result Value Ref Range Status   MRSA by PCR NEGATIVE NEGATIVE Final    Comment:        The GeneXpert MRSA Assay (FDA approved for NASAL specimens only), is one component of a comprehensive MRSA colonization surveillance program. It is not intended to diagnose MRSA infection nor to guide or monitor treatment for MRSA infections. Performed at [facility], [place] [address] [place], [place] [address]   Culture, Urine     Status: None   Collection Time: 02/26/20  5:08 PM   Specimen: Urine, Random  Result Value Ref Range Status   Specimen Description URINE, RANDOM  Final   Special Requests NONE  Final   Culture   Final    NO GROWTH Performed at [facility], [place] [address], [place], [place] [address]     Report Status 02/27/2020 FINAL  Final  Culture, blood (routine x 2)     Status: None   Collection Time: 02/26/20 10:43 PM   Specimen: BLOOD  Result Value Ref Range Status   Specimen Description BLOOD SITE NOT SPECIFIED  Final   Special Requests   Final    AEROBIC BOTTLE ONLY Blood Culture results may not be optimal due to an inadequate volume of blood received in culture bottles   Culture   Final    NO GROWTH 5 DAYS Performed at [facility], [place] [address], [place], [place] [address]    Report Status 03/02/2020 FINAL  Final  Culture, blood (routine x 2)     Status: None   Collection Time: 02/26/20 10:43 PM   Specimen: BLOOD  Result Value Ref Range Status   Specimen Description BLOOD SITE NOT SPECIFIED  Final   Special Requests AEROBIC BOTTLE ONLY Blood Culture adequate volume  Final   Culture   Final    NO GROWTH 5 DAYS Performed at [place] [address], [place], [place] [address]    Report Status 03/02/2020 FINAL  Final  Urine Culture     Status: None   Collection Time: 03/04/20  5:30 AM   Specimen: Urine, Random  Result Value Ref Range Status   Specimen Description URINE, RANDOM  Final   Special Requests STERILE CUP  Final   Culture   Final    NO GROWTH Performed at [facility], [place] [address], [place], [place] [address]    Report Status 03/05/2020 FINAL  Final  Culture, blood (routine x 2)     Status: None (Preliminary result)   Collection Time: 03/04/20  8:48 PM   Specimen: BLOOD  Result Value Ref Range Status   Specimen Description BLOOD  LEFT ANTECUBITAL  Final   Special Requests   Final    BOTTLES DRAWN AEROBIC AND ANAEROBIC Blood Culture results may not be optimal due to an inadequate volume of blood received in culture bottles   Culture   Final    NO GROWTH < 24 HOURS Performed at [place] [address] [place], [place] [address]    Report Status PENDING  Incomplete  Culture, blood (routine x 2)     Status: None  (Preliminary result)   Collection Time: 03/04/20  8:48 PM   Specimen: BLOOD LEFT HAND  Result Value Ref Range Status   Specimen Description BLOOD LEFT HAND  Final   Special Requests   Final    BOTTLES DRAWN AEROBIC AND ANAEROBIC Blood Culture results may not be optimal due to an inadequate volume of blood received in culture bottles   Culture   Final    NO GROWTH < 24 HOURS Performed at [facility], [place] [address], [place], [place] [address]    Report Status PENDING  Incomplete    Procedures and diagnostic studies:  No results found.  Medications:   . chlorhexidine gluconate (MEDLINE KIT)  15 mL Mouth Rinse BID  . Chlorhexidine Gluconate Cloth  6 each Topical Q0600  . Chlorhexidine Gluconate Cloth  6 each Topical Q0600  . dextromethorphan  30 mg Oral BID  . docusate sodium  100 mg Oral BID  . feeding supplement  237 mL Oral TID BM  . levETIRAcetam  250 mg Oral Q T,Th,Sa-HD  . levETIRAcetam  500 mg Oral BID  . mouth rinse  15 mL Mouth Rinse BID  . multivitamin  1 tablet Oral QHS  . NIFEdipine  90 mg Oral Daily  . sodium chloride flush  10-40 mL Intracatheter Q12H   Continuous Infusions: . sodium chloride Stopped (02/28/20 1701)  . ceFEPime (MAXIPIME) IV 1 g (03/05/20 1119)  . vancomycin       LOS: 9 days   Jacquelynn Cree, MD  [facility]   [facility] How to contact the [facility] Attending or Consulting provider [place] or covering provider during after hours [place], for this patient?  1. Check the care team in [facility] and look for a) attending/consulting [facility] provider listed and b) the [facility] team listed 2. Log into [email or web address] and use [place]'s universal password to access. If you do not have the password, please contact the hospital operator. 3. Locate the [facility] provider you are looking for under [facility] and page to a number that you can be directly reached. 4. If you still have difficulty reaching the provider, please page the [facility] (Director  on Call) for the Hospitalists listed on amion for assistance.  03/05/2020, 1:32 PM

## 2020-03-05 NOTE — Plan of Care (Signed)
  Problem: Clinical Measurements: Goal: Will remain free from infection Outcome: Progressing   Problem: Clinical Measurements: Goal: Ability to maintain clinical measurements within normal limits will improve Outcome: Progressing Goal: Will remain free from infection Outcome: Progressing Goal: Diagnostic test results will improve Outcome: Progressing Goal: Respiratory complications will improve Outcome: Progressing Goal: Cardiovascular complication will be avoided Outcome: Progressing   Problem: Activity: Goal: Risk for activity intolerance will decrease Outcome: Progressing   Problem: Nutrition: Goal: Adequate nutrition will be maintained Outcome: Progressing   Problem: Coping: Goal: Level of anxiety will decrease Outcome: Progressing   Problem: Elimination: Goal: Will not experience complications related to bowel motility Outcome: Progressing Goal: Will not experience complications related to urinary retention Outcome: Progressing   Problem: Pain Managment: Goal: General experience of comfort will improve Outcome: Progressing   Problem: Safety: Goal: Ability to remain free from injury will improve Outcome: Progressing   Problem: Skin Integrity: Goal: Risk for impaired skin integrity will decrease Outcome: Progressing   Problem: Education: Goal: Knowledge of disease and its progression will improve Outcome: Progressing   Problem: Health Behavior/Discharge Planning: Goal: Ability to manage health-related needs will improve Outcome: Progressing   Problem: Clinical Measurements: Goal: Complications related to the disease process or treatment will be avoided or minimized Outcome: Progressing Goal: Dialysis access will remain free of complications Outcome: Progressing   Problem: Activity: Goal: Activity intolerance will improve Outcome: Progressing   Problem: Fluid Volume: Goal: Fluid volume balance will be maintained or improved Outcome: Progressing    Problem: Nutritional: Goal: Ability to make appropriate dietary choices will improve Outcome: Progressing   Problem: Respiratory: Goal: Respiratory symptoms related to disease process will be avoided Outcome: Progressing   Problem: Self-Concept: Goal: Body image disturbance will be avoided or minimized Outcome: Progressing   Problem: Urinary Elimination: Goal: Progression of disease will be identified and treated Outcome: Progressing   Problem: Activity: Goal: Ability to tolerate increased activity will improve Outcome: Progressing   Problem: Respiratory: Goal: Ability to maintain a clear airway and adequate ventilation will improve Outcome: Progressing   Problem: Role Relationship: Goal: Method of communication will improve Outcome: Progressing

## 2020-03-06 ENCOUNTER — Inpatient Hospital Stay (HOSPITAL_COMMUNITY): Payer: 59

## 2020-03-06 DIAGNOSIS — D62 Acute posthemorrhagic anemia: Secondary | ICD-10-CM

## 2020-03-06 DIAGNOSIS — J9621 Acute and chronic respiratory failure with hypoxia: Secondary | ICD-10-CM | POA: Diagnosis not present

## 2020-03-06 DIAGNOSIS — I469 Cardiac arrest, cause unspecified: Secondary | ICD-10-CM

## 2020-03-06 DIAGNOSIS — R509 Fever, unspecified: Secondary | ICD-10-CM | POA: Diagnosis not present

## 2020-03-06 DIAGNOSIS — N179 Acute kidney failure, unspecified: Secondary | ICD-10-CM | POA: Diagnosis not present

## 2020-03-06 LAB — ECHOCARDIOGRAM COMPLETE
AR max vel: 2.18 cm2
AV Area VTI: 2.12 cm2
AV Area mean vel: 2.03 cm2
AV Mean grad: 9 mmHg
AV Peak grad: 16.5 mmHg
Ao pk vel: 2.03 m/s
Height: 62.992 in
S' Lateral: 3 cm
Weight: 2680.79 oz

## 2020-03-06 LAB — CBC
HCT: 24.6 % — ABNORMAL LOW (ref 36.0–46.0)
Hemoglobin: 7.8 g/dL — ABNORMAL LOW (ref 12.0–15.0)
MCH: 28.4 pg (ref 26.0–34.0)
MCHC: 31.7 g/dL (ref 30.0–36.0)
MCV: 89.5 fL (ref 80.0–100.0)
Platelets: 265 10*3/uL (ref 150–400)
RBC: 2.75 MIL/uL — ABNORMAL LOW (ref 3.87–5.11)
RDW: 15.4 % (ref 11.5–15.5)
WBC: 18 10*3/uL — ABNORMAL HIGH (ref 4.0–10.5)
nRBC: 0 % (ref 0.0–0.2)

## 2020-03-06 LAB — RENAL FUNCTION PANEL
Albumin: 1.4 g/dL — ABNORMAL LOW (ref 3.5–5.0)
Anion gap: 16 — ABNORMAL HIGH (ref 5–15)
BUN: 35 mg/dL — ABNORMAL HIGH (ref 8–23)
CO2: 20 mmol/L — ABNORMAL LOW (ref 22–32)
Calcium: 7.4 mg/dL — ABNORMAL LOW (ref 8.9–10.3)
Chloride: 96 mmol/L — ABNORMAL LOW (ref 98–111)
Creatinine, Ser: 6.36 mg/dL — ABNORMAL HIGH (ref 0.44–1.00)
GFR, Estimated: 7 mL/min — ABNORMAL LOW (ref >60)
Glucose, Bld: 62 mg/dL — ABNORMAL LOW (ref 70–99)
Phosphorus: 4.9 mg/dL — ABNORMAL HIGH (ref 2.5–4.6)
Potassium: 4.7 mmol/L (ref 3.5–5.1)
Sodium: 132 mmol/L — ABNORMAL LOW (ref 135–145)

## 2020-03-06 LAB — GLUCOSE, CAPILLARY
Glucose-Capillary: 83 mg/dL (ref 70–99)
Glucose-Capillary: 77 mg/dL (ref 70–99)
Glucose-Capillary: 103 mg/dL — ABNORMAL HIGH (ref 70–99)

## 2020-03-06 LAB — Q FEVER ANTIBODIES, IGG
Q Fever Phase I: NEGATIVE
Q Fever Phase II: NEGATIVE

## 2020-03-06 LAB — PROCALCITONIN: Procalcitonin: >150 ng/mL

## 2020-03-06 IMAGING — CT CT CHEST W/O CM
2 of 3 series · 15 of 36 positions shown, 18 images · non-contrast
Comparison: Lung bases from abdominal CT [DATE]. Chest
radiograph [DATE]

CLINICAL DATA: Fever of unknown origin. Recent COVID infection.
Progressive fatigue and back pain. Recent renal biopsy.

EXAM:
CT CHEST WITHOUT CONTRAST
TECHNIQUE: Multidetector CT imaging of the chest was performed following the
standard protocol without IV contrast.

[Series 3: chest w/o 2mm st · axial · non-contrast · 0.73mm/px · z∈[+1322,+1590]mm · 12 of 158 slices shown, 15 images]
[im 12/158  mediastinal]
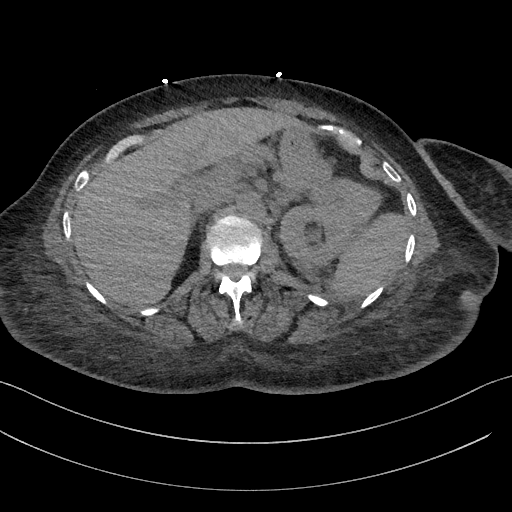
[im 12/158  lung]
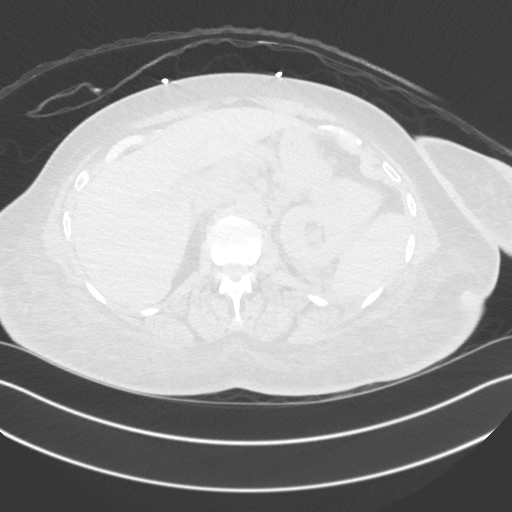
[im 24/158  lung]
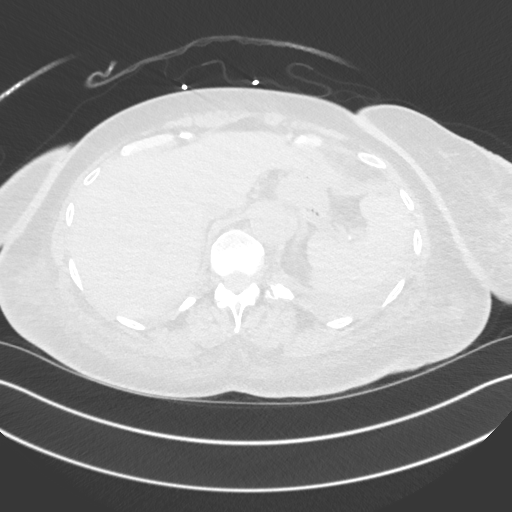
[im 35/158  lung]
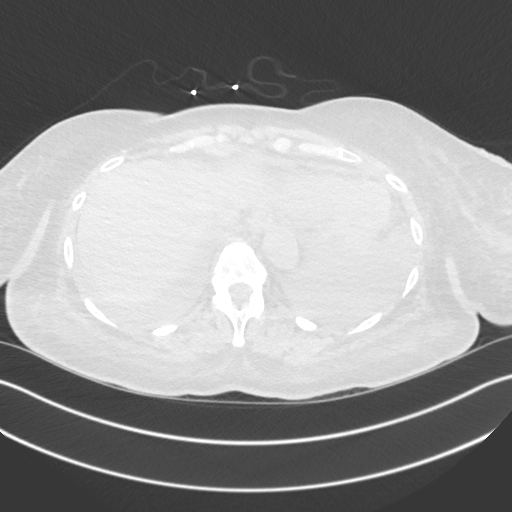
[im 47/158  lung]
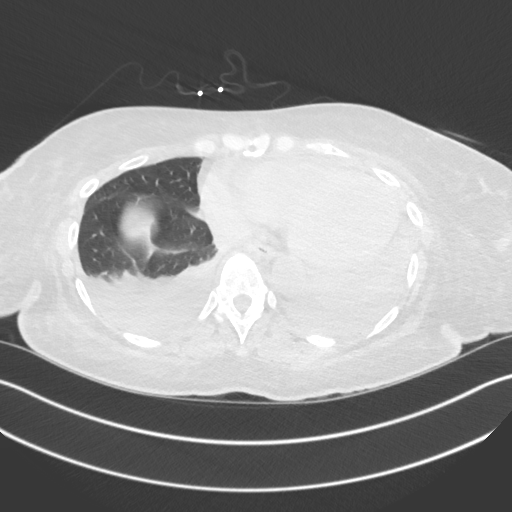
[im 59/158  mediastinal]
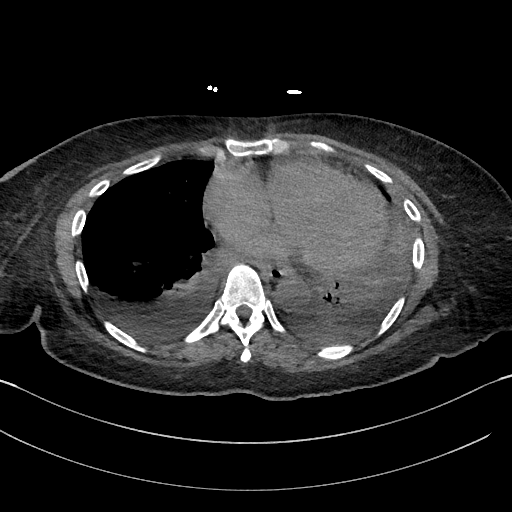
[im 59/158  lung]
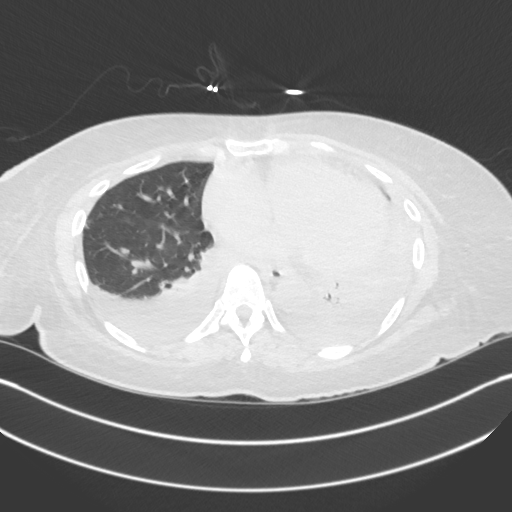
[im 70/158  lung]
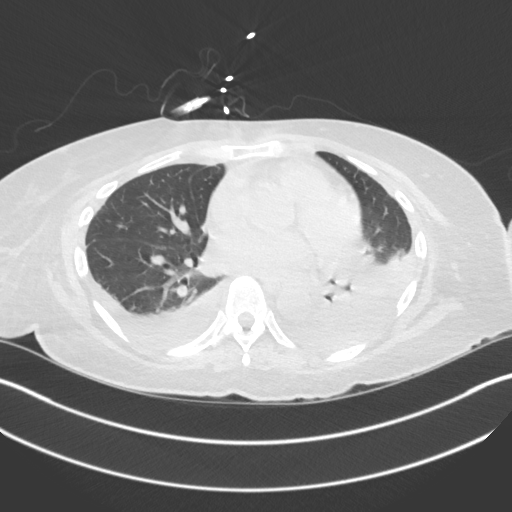
[im 88/158  lung]
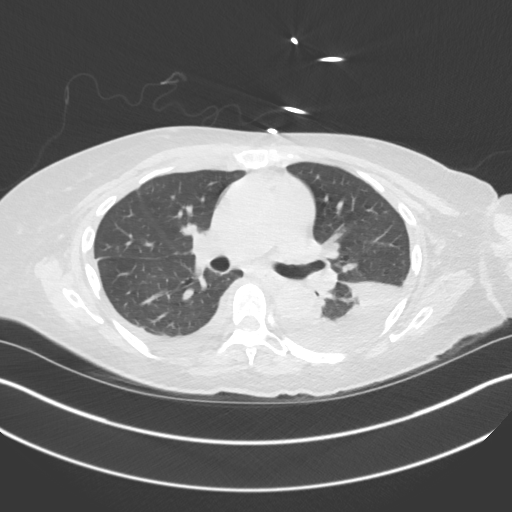
[im 99/158  lung]
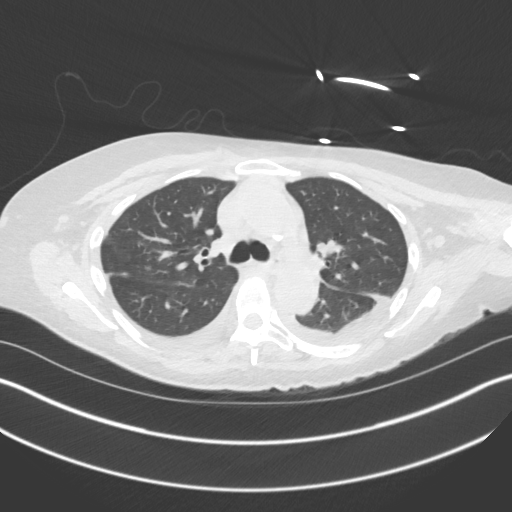
[im 111/158  mediastinal]
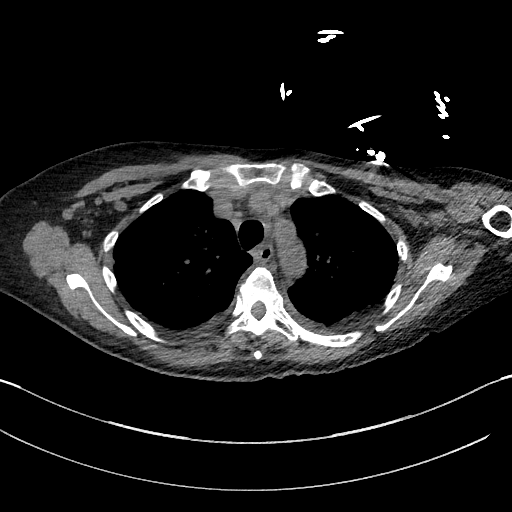
[im 111/158  lung]
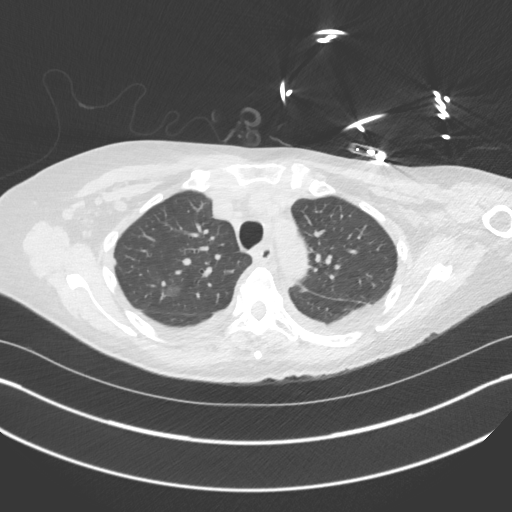
[im 123/158  lung]
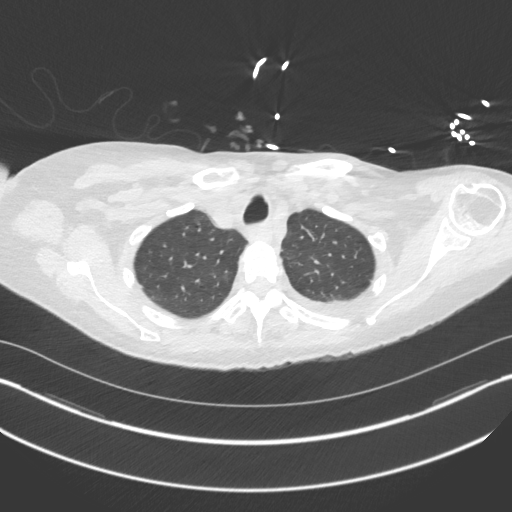
[im 134/158  lung]
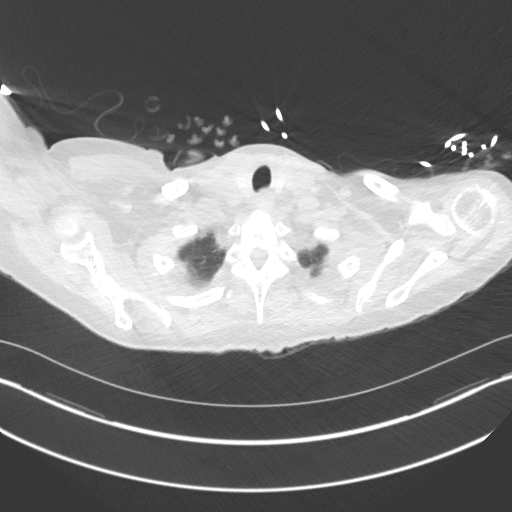
[im 146/158  lung]
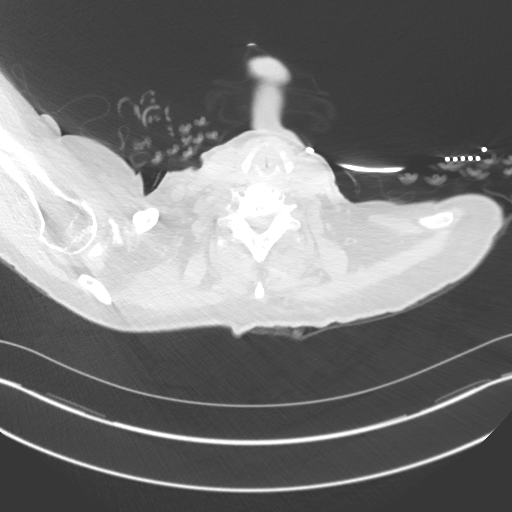

[Series 5: chest w/o 2mm st cor · coronal · non-contrast · 0.62mm/px · 3 of 105 slices shown]
[im 21/105  lung]
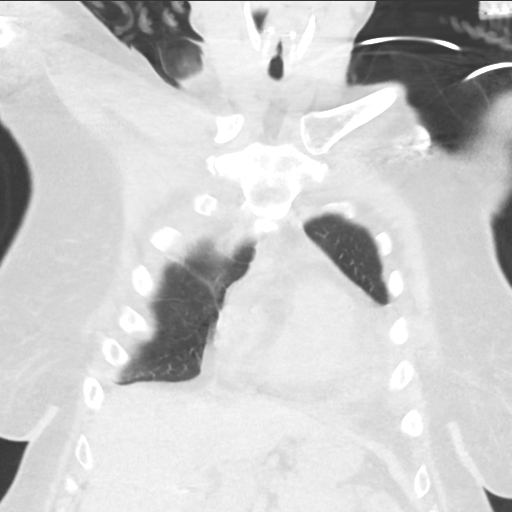
[im 42/105  lung]
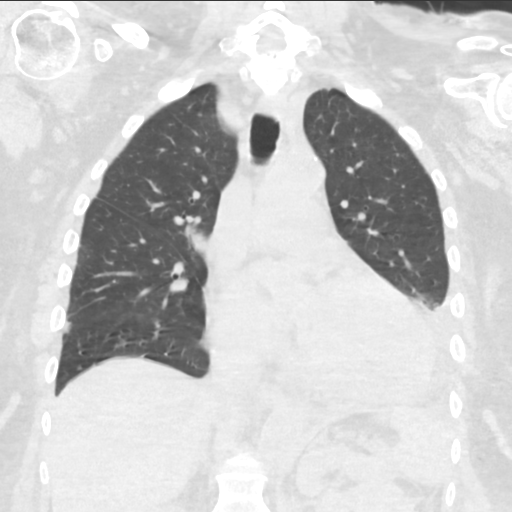
[im 63/105  lung]
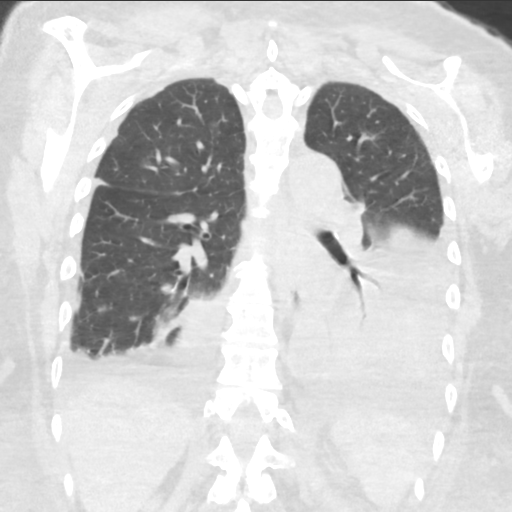

[15 of 36 positions shown; findings below may reference images not displayed]

FINDINGS: Cardiovascular: Moderate aortic atherosclerosis. Mild cardiomegaly.
Trace pericardial fluid. Decreased density of cardiac blood pool
consistent with provided history of anemia.

Mediastinum/Nodes: Shotty mediastinal lymph nodes with prominent
nodes measuring up to 9 mm short axis. Limited assessment for hilar
adenopathy on this noncontrast exam. Small bilateral axillary lymph
nodes not enlarged by size criteria. No thyroid nodule. Decompressed
esophagus.

Lungs/Pleura: Moderate bilateral pleural effusions. Pleural effusion
on the right is not significantly changed, pleural effusion on the
left has increased in size. Associated compressive atelectasis in
the left greater than right lower lobe. No other focal airspace
disease. No findings of pulmonary edema. Trachea and central bronchi
are patent.

Upper Abdomen: Left perinephric hematoma, mild improvement.
Cholecystectomy.

Musculoskeletal: Generalized subcutaneous body wall edema. Minimal
thoracic spondylosis with endplate spurring. There are no acute or
suspicious osseous abnormalities.
IMPRESSION: 1. Moderate bilateral pleural effusions with compressive atelectasis
in the left greater than right lower lobe. Pleural effusion on the
right is not significantly changed from recent abdominal CT, pleural
effusion on the left has increased in size.
2. Shotty mediastinal lymph nodes are likely reactive.
3. Generalized subcutaneous body wall edema.
4. Mild cardiomegaly.
5. Improving left perinephric hematoma.

Aortic Atherosclerosis ([VG]-[VG]).

## 2020-03-06 MED ORDER — VANCOMYCIN HCL 1000 MG/200ML IV SOLN
1000.0000 mg | INTRAVENOUS | Status: AC
  Administered 2020-03-09: 1000 mg via INTRAVENOUS
  Filled 2020-03-06: qty 200

## 2020-03-06 MED ORDER — DEXTROSE 50 % IV SOLN
INTRAVENOUS | Status: AC
  Filled 2020-03-06: qty 50

## 2020-03-06 MED ORDER — HEPARIN SODIUM (PORCINE) 1000 UNIT/ML IJ SOLN
INTRAMUSCULAR | Status: AC
  Filled 2020-03-06: qty 3

## 2020-03-06 MED ORDER — ACETAMINOPHEN 500 MG PO TABS
ORAL_TABLET | ORAL | Status: AC
  Administered 2020-03-06: 1000 mg via ORAL
  Filled 2020-03-06: qty 2

## 2020-03-06 NOTE — Hospital Course (Signed)
[name] is a 65 yo female with PMH HTN, prior Covid (Dec 2021) who presented to the hospital with complaints of lower extremity swelling, back pain, generalized malaise/fatigue at home. She underwent extensive work-up and is followed by nephrology, ID.  In the ED, she was found to have a hemoglobin of 6.3, potassium 5.4, sodium 130, bicarb 9, BUN 144, and creatinine 29.98.  CT of the abdomen and pelvis was negative for obstruction or acute abnormality.  Temporary hemodialysis catheter was placed by IR and subsequently the patient had a PEA arrest associated with seizure-like activity.  She was intubated and subsequently extubated 02/28/2020.    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 Renal biopsy done 03/03/2019.  ANA positive.  ID is following for fevers and infectious etiology possibly underlying.   Trialysis cath removed on 03/06/20.   See below for problem based plans.

## 2020-03-06 NOTE — Assessment & Plan Note (Signed)
-   Patient had seizure with lossof pulse and PEA arrest s/p CPR. - Patient was subsequently evaluated by neurology who restarted patient on Keppra.  - Plan is to discharge patient on Keppra 500 mg twice daily.

## 2020-03-06 NOTE — Progress Notes (Signed)
Referring Physician(s): Dr. Marval Regal  Supervising Physician: Arne Cleveland  Patient Status:  [facility] - In-pt  Chief Complaint: Acute renal injury  Subjective: Patient alert and oriented during visit today.  States she is still processing all of the recent change.  Followed by ID for fevers, elevated WBC.  IR consulted for tunneled dialysis catheter placement.   Allergies: Erythromycin, Iodine, Penicillins, and Shellfish-derived products  Medications: Prior to Admission medications   Medication Sig Start Date End Date Taking? Authorizing Provider  acetaminophen (TYLENOL) 650 MG CR tablet Take 650 mg by mouth as needed for pain.   Yes [provider]  aspirin EC 81 MG tablet Take 81 mg by mouth daily.   Yes [provider]  HYDROcodone-acetaminophen (NORCO/VICODIN) 5-325 MG tablet Take 1 tablet by mouth 3 (three) times daily as needed for severe pain. 02/23/20  Yes [provider]  lisinopril (ZESTRIL) 5 MG tablet Take 5 mg by mouth daily. 02/24/20  Yes [provider]  NIFEdipine (ADALAT CC) 30 MG 24 hr tablet Take 30 mg by mouth at bedtime.   Yes [provider]  NIFEdipine (ADALAT CC) 90 MG 24 hr tablet Take 90 mg by mouth every morning. 01/11/16  Yes [provider]     Vital Signs: BP (!) 151/95 (BP Location: Right Arm)   Pulse (!) 126   Temp 99.8 F (37.7 C) (Oral)   Resp (!) 24   Ht 5' 2.99" (1.6 m)   Wt 167 lb 8.8 oz (76 kg)   SpO2 92%   BMI 29.69 kg/m   Physical Exam  NAD, alert Neck: soft, supple. Dressing in place from recent temp cath removal.   Imaging: CT ABDOMEN PELVIS WO CONTRAST  Result Date: 03/03/2020 CLINICAL DATA:  Anemia, history of recent left renal biopsy EXAM: CT ABDOMEN AND PELVIS WITHOUT CONTRAST TECHNIQUE: Multidetector CT imaging of the abdomen and pelvis was performed following the standard protocol without IV contrast. COMPARISON:  02/25/2020 FINDINGS: Lower chest: New bilateral  pleural effusions and bibasilar atelectasis are noted when compared with the prior exam. No pneumothorax is seen. Cardiac blood pool is decreased in attenuation consistent with the underlying history of anemia. Hepatobiliary: No focal liver abnormality is seen. Status post cholecystectomy. No biliary dilatation. Pancreas: Unremarkable. No pancreatic ductal dilatation or surrounding inflammatory changes. Spleen: Normal in size without focal abnormality. Adrenals/Urinary Tract: Adrenal glands are within normal limits. Right kidney demonstrates a nonobstructing stone in the midportion measuring 8 mm. Left kidney demonstrates no obstructive change although hyperdense material is noted surrounding the lower pole of the left kidney and extending superiorly consistent with perinephric hemorrhage related to the recent biopsy. No significant impingement upon the native kidney is noted. The bladder is decompressed. Stomach/Bowel: The appendix is not well visualized. No inflammatory changes to suggest appendicitis are noted. Colon shows no obstructive or inflammatory changes. Small bowel and stomach appear within normal limits. Vascular/Lymphatic: Aortic atherosclerosis. No enlarged abdominal or pelvic lymph nodes. Reproductive: Multiple calcified uterine fibroids are noted. Other: Minimal free fluid is noted in the pelvis. Changes of anasarca are noted. Musculoskeletal: Lumbar spine degenerative changes are noted without acute abnormality. IMPRESSION: Changes consistent with the recent renal biopsy with perinephric hematoma identified. No significant impingement upon the underlying native kidney is noted. No obstructive changes are seen. Nonobstructing midpole right renal stone stable from the prior exam. New bilateral effusions and lower lobe atelectasis. Mild changes of anasarca Electronically Signed   By: Inez Catalina M.D.   On:  03/03/2020 10:50   ECHOCARDIOGRAM COMPLETE  Result Date: 03/06/2020    ECHOCARDIOGRAM REPORT    Patient Name:   Barbara Crawford Date of Exam: 03/06/2020 Medical Rec #:  [id number]             Height:       63.0 in Accession #:    [id number]            Weight:       167.5 lb Date of Birth:  [birth year]            BSA:          1.793 m Patient Age:    66 years              BP:           151/95 mmHg Patient Gender: F                     HR:           121 bpm. Exam Location:  Inpatient Procedure: 2D Echo, Cardiac Doppler and Color Doppler Indications:    Fever  History:        Patient has no prior history of Echocardiogram examinations.                 Risk Factors:Hypertension. H/O COVID-19 infection.  Sonographer:    Clayton Lefort RDCS (AE) Referring Phys: [id number] [place]  1. Left ventricular ejection fraction, by estimation, is 50 to 55%. The left ventricle has low normal function. The left ventricle has no regional wall motion abnormalities. There is moderate left ventricular hypertrophy. Indeterminate diastolic filling  due to E-A fusion. There is incoordinate septal motion.  2. Right ventricular systolic function is normal. The right ventricular size is normal. There is normal pulmonary artery systolic pressure. The estimated right ventricular systolic pressure is [id number] mmHg.  3. Left atrial size was moderately dilated.  4. The pericardial effusion is posterior to the left ventricle.  5. The mitral valve is grossly normal. Trivial mitral valve regurgitation.  6. The aortic valve is tricuspid. Aortic valve regurgitation is not visualized. Mild aortic valve sclerosis is present, with no evidence of aortic valve stenosis.  7. The inferior vena cava is normal in size with greater than 50% respiratory variability, suggesting right atrial pressure of 3 mmHg. Comparison(s): No prior Echocardiogram. Conclusion(s)/Recommendation(s): No evidence of valvular vegetations on this transthoracic echocardiogram. Would recommend a transesophageal echocardiogram to exclude infective endocarditis if clinically  indicated. FINDINGS  Left Ventricle: Left ventricular ejection fraction, by estimation, is 50 to 55%. The left ventricle has low normal function. The left ventricle has no regional wall motion abnormalities. The left ventricular internal cavity size was normal in size. There is moderate left ventricular hypertrophy. Incoordinate septal motion. Indeterminate diastolic filling due to E-A fusion. Right Ventricle: The right ventricular size is normal. No increase in right ventricular wall thickness. Right ventricular systolic function is normal. There is normal pulmonary artery systolic pressure. The tricuspid regurgitant velocity is 2.62 m/s, and  with an assumed right atrial pressure of 3 mmHg, the estimated right ventricular systolic pressure is [id number] mmHg. Left Atrium: Left atrial size was moderately dilated. Right Atrium: Right atrial size was normal in size. Pericardium: Trivial pericardial effusion is present. The pericardial effusion is posterior to the left ventricle. Mitral Valve: The mitral valve is grossly normal. Trivial mitral valve regurgitation. Tricuspid Valve: The tricuspid valve is grossly normal. Tricuspid valve regurgitation  is mild. Aortic Valve: The aortic valve is tricuspid. Aortic valve regurgitation is not visualized. Mild aortic valve sclerosis is present, with no evidence of aortic valve stenosis. Aortic valve mean gradient measures 9.0 mmHg. Aortic valve peak gradient measures 16.5 mmHg. Aortic valve area, by VTI measures 2.12 cm. Pulmonic Valve: The pulmonic valve was normal in structure. Pulmonic valve regurgitation is not visualized. Aorta: The aortic root and ascending aorta are structurally normal, with no evidence of dilitation. Venous: The inferior vena cava is normal in size with greater than 50% respiratory variability, suggesting right atrial pressure of 3 mmHg. IAS/Shunts: No atrial level shunt detected by color flow Doppler. Additional Comments: There is a small pleural effusion  in the left lateral region.  LEFT VENTRICLE PLAX 2D LVIDd:         4.10 cm LVIDs:         3.00 cm LV PW:         1.50 cm LV IVS:        1.40 cm LVOT diam:     2.10 cm LV SV:         66 LV SV Index:   37 LVOT Area:     3.46 cm  RIGHT VENTRICLE          IVC RV Basal diam:  3.40 cm  IVC diam: 1.60 cm LEFT ATRIUM             Index       RIGHT ATRIUM           Index LA diam:        2.60 cm 1.45 cm/m  RA Area:     17.80 cm LA Vol (A2C):   66.7 ml 37.19 ml/m RA Volume:   49.50 ml  27.60 ml/m LA Vol (A4C):   86.4 ml 48.17 ml/m LA Biplane Vol: 76.4 ml 42.60 ml/m  AORTIC VALVE AV Area (Vmax):    2.18 cm AV Area (Vmean):   2.03 cm AV Area (VTI):     2.12 cm AV Vmax:           203.00 cm/s AV Vmean:          145.000 cm/s AV VTI:            0.310 m AV Peak Grad:      16.5 mmHg AV Mean Grad:      9.0 mmHg LVOT Vmax:         128.00 cm/s LVOT Vmean:        84.900 cm/s LVOT VTI:          0.190 m LVOT/AV VTI ratio: 0.61  AORTA Ao Root diam: 3.00 cm Ao Asc diam:  3.20 cm TRICUSPID VALVE TR Peak grad:   27.5 mmHg TR Vmax:        262.00 cm/s  SHUNTS Systemic VTI:  0.19 m Systemic Diam: 2.10 cm Lyman Bishop MD Electronically signed by Lyman Bishop MD Signature Date/Time: 03/06/2020/3:22:26 PM    Final     Labs:  CBC: Recent Labs    03/03/20 0726 03/03/20 1918 03/04/20 0044 03/04/20 0740 03/05/20 0002 03/05/20 0721 03/05/20 0807 03/06/20 0237  WBC 9.6  --  8.8  --   --   --  18.3* 18.0*  HGB 6.3*   < > 7.9*   < > 8.0* 7.6* 7.8* 7.8*  HCT 20.2*   < > 24.4*   < > 24.6* 24.1* 22.9* 24.6*  PLT 210  --  223  --   --   --  222 265   < > = values in this interval not displayed.    COAGS: Recent Labs    02/26/20 2110 03/05/20 1457  INR 1.0 1.2  APTT 29 38*    BMP: Recent Labs    03/03/20 0543 03/04/20 0044 03/05/20 0807 03/06/20 0237  NA 135 135 132* 132*  K 4.0 3.9 4.3 4.7  CL 98 99 98 96*  CO2 '24 23 22 '$ 20*  GLUCOSE 87 125* 71 62*  BUN 16 24* 22 35*  CALCIUM 8.3* 7.7* 7.2* 7.4*  CREATININE  3.90* 5.35* 5.19* 6.36*  GFRNONAA 12* 8* 9* 7*    LIVER FUNCTION TESTS: Recent Labs    02/26/20 1457 02/26/20 1529 03/01/20 0050 03/02/20 0324 03/03/20 0543 03/04/20 0044 03/05/20 0807 03/06/20 0237  BILITOT 0.3  --  0.3 0.5  --   --  0.6  --   AST 46*  --  103* 70*  --   --  44*  --   ALT 38  --  54* 53*  --   --  35  --   ALKPHOS 49  --  60 57  --   --  58  --   PROT 6.6  --  5.8* 5.7*  --   --  5.5*  --   ALBUMIN 1.6*   < > 1.4* 1.4* 1.3* 1.4* 1.3* 1.4*   < > = values in this interval not displayed.    Assessment and Plan: Acute renal failure Patient known to IR this admission after kidney biopsy with small hematoma. HgB stable.  Continues with nephrotic syndrome and renal insufficiency on dialysis via temp cath.  This was removed today as patient has spiked fevers with WBC elevation.  Per ID/Nephrology, line holiday this weekend then for tunneled HD catheter placement Monday.  Will monitor labs and signs of infection.  Patient has been consented for possible procedure as schedule allows.  Not currently on antibiotics.  NPO p MN for possible procedure Monday.   Risks and benefits discussed with the patient including, but not limited to bleeding, infection, vascular injury, pneumothorax which may require chest tube placement, air embolism or even death  All of the patient's questions were answered, patient is agreeable to proceed. Consent signed and in chart.  Electronically Signed: Docia Barrier, PA 03/06/2020, 4:34 PM   I spent a total of 25 Minutes at the the patient's bedside AND on the patient's hospital floor or unit, greater than 50% of which was counseling/coordinating care for acute renal failure.

## 2020-03-06 NOTE — Assessment & Plan Note (Signed)
-  Not reporting symptoms -UA reviewed from 02/29/2020: Nitrite negative, moderate leukocyte esterase, no bacteria, WBC clumps, 11-20 RBCs - treated with CTX 2/26 - 3/2 and cefepime x 1 on 3/3

## 2020-03-06 NOTE — Assessment & Plan Note (Signed)
-   see normocytic anemia

## 2020-03-06 NOTE — Assessment & Plan Note (Signed)
-   see AKI

## 2020-03-06 NOTE — Assessment & Plan Note (Signed)
-   Continue with nifedipine and labetalol as needed.  Intermittently hypertensive.

## 2020-03-06 NOTE — TOC Initial Note (Signed)
Transition of Care [facility]) - Initial/Assessment Note    Patient Details  Name: Barbara Crawford MRN: [id number] Date of Birth: 1955-11-17  [facility] CM/SW Contact:    Joanne Chars, LCSW Phone Number: 03/06/2020, 3:31 PM  Clinical Narrative:    CSW met with pt to discuss discharge plan.  Pt agreeable to [facility] referral, choice document given.  Permission given to speak with daughter and son.  PCP in place.  No current equipment in home.  Pt is not vaccinated for covid.                 Expected Discharge Plan: [place] Barriers to Discharge: Continued Medical Work up   Patient Goals and CMS Choice Patient states their goals for this hospitalization and ongoing recovery are:: "get back on my feet" CMS Medicare.gov Compare Post Acute Care list provided to:: Patient Choice offered to / list presented to : Patient  Expected Discharge Plan and Services Expected Discharge Plan: [place] Choice: [place] arrangements for the past 2 months: Apartment                                      Prior Living Arrangements/Services Living arrangements for the past 2 months: Apartment Lives with:: Adult Children Patient language and need for interpreter reviewed:: Yes Do you feel safe going back to the place where you live?: Yes      Need for Family Participation in Patient Care: No (Comment) Care giver support system in place?: Yes (comment) Current home services: Other (comment) (none) Criminal Activity/Legal Involvement Pertinent to Current Situation/Hospitalization: No - Comment as needed  Activities of Daily Living      Permission Sought/Granted Permission sought to share information with : Family Supports Permission granted to share information with : Yes, Verbal Permission Granted  Share Information with NAME: daughter Blima Dessert, son Beverely Low  Permission granted to share info w AGENCY: HH         Emotional Assessment Appearance:: Appears stated age Attitude/Demeanor/Rapport: Engaged Affect (typically observed): Appropriate,Pleasant Orientation: : Oriented to Self,Oriented to Place,Oriented to  Time,Oriented to Situation Alcohol / Substance Use: Not Applicable Psych Involvement: No (comment)  Admission diagnosis:  Encounter for central line placement [Z45.2] AKI (acute kidney injury) ([place]) [N17.9] Acute kidney failure ([place]) [N17.9] Acute renal failure, unspecified acute renal failure type [facility]) [N17.9] Patient Active Problem List   Diagnosis Date Noted  . Sepsis ([place]) versus SIRS due to autoimmune process 03/05/2020  . Fever   . Glomerulonephritis   . Perinephric hematoma 03/04/2020  . Seizure ([place]) 03/04/2020  . Cardiac arrest with pulseless electrical activity ([place]) 03/04/2020  . Acute on chronic respiratory failure with hypoxia ([place]) 03/04/2020  . Acute lower UTI 03/04/2020  . Lobar pneumonia ([place]) 03/04/2020  . Ventilator dependence ([place])   . Acute kidney failure ([place]) 02/25/2020  . Anemia 02/25/2020  . Uremia 02/25/2020  . HTN (hypertension) 02/25/2020   PCP:  Vonna Drafts, FNP Pharmacy:   [facility] [place], [address] [place] [address] Phone: [phone] Fax: [phone]     Social Determinants of Health (SDOH) Interventions    Readmission Risk Interventions No flowsheet data found.

## 2020-03-06 NOTE — Progress Notes (Signed)
  Echocardiogram 2D Echocardiogram has been performed.  Barbara Crawford 03/06/2020, 2:46 PM

## 2020-03-06 NOTE — Progress Notes (Signed)
PROGRESS NOTE    Christella App   CWC:[id number]  DOB: 02/16/1955  DOA: 02/25/2020     10  PCP: Vonna Drafts, FNP  CC: LE swelling, malaise/fatigue  Hospital Course: [name] is a 65 yo female with PMH HTN, prior Covid (Dec 2021) who presented to the hospital with complaints of lower extremity swelling, back pain, generalized malaise/fatigue at home. She underwent extensive work-up and is followed by nephrology, ID.  In the ED, she was found to have a hemoglobin of 6.3, potassium 5.4, sodium 130, bicarb 9, BUN 144, and creatinine 29.98.  CT of the abdomen and pelvis was negative for obstruction or acute abnormality.  Temporary hemodialysis catheter was placed by IR and subsequently the patient had a PEA arrest associated with seizure-like activity.  She was intubated and subsequently extubated 02/28/2020.    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 Renal biopsy done 03/03/2019.  ANA positive.  ID is following for fevers and infectious etiology possibly underlying.   Trialysis cath removed on 03/06/20.   See below for problem based plans.    Interval History:  No events overnight.  Patient for the first time today.  Resting comfortably in bed on room air complaining of some soreness in her chest from previous chest compressions.  Endorses improvement in her malaise/lethargy. Underwent dialysis this morning, tolerated well.  Vas-Cath port was removed this afternoon.  ROS: Constitutional: positive for fatigue and malaise, Respiratory: negative for wheezing, Cardiovascular: negative for chest pain and Gastrointestinal: negative for abdominal pain  Assessment & Plan: * AKI (acute kidney injury) ([place]) - Patient presented with elevation of BUN (144) and creat (29.98) -No prior known renal dysfunction and creatinine normal in 2018 - Renal US showed echogenic kidneys. CT renal stone study showed non-obstructive right nephrolithiasis.  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 ANA positive, no M spike detected on protein electrophoresis, ASO negative, C3 117, C4 35, ANCA negative,GBM antibody negative.RPR non-reactive.  dsDNA negative, SCL 70 Positive+ (1.9), SSA IgG > 8+, SSB normal,  Chromatin antibody positive.   - Status post renal biopsy 03/02/2020.  Developed a perinephric hematoma post biopsy.  Continue hemodialysis per nephrology.  - s/p removal of HD-cath on 03/06/20; tentative plan for permcath on 3/7  Fever - Differential broad: PNA vs atelectasis vs autoimmune mediated vs infected HD port (now removed 03/06/20) vs other - ID now on board; extensive workup underway - s/p HD cath removed on 3/4 - repeat blood cultures if recurrent fever - follow up pending imaging studies and labs - no abx for now per ID due to u/k source if any   Glomerulonephritis - see AKI - awaiting renal biopsy results   Lobar pneumonia ([place]) -See fever work-up as well.  Extubated on 02/28/2020 -Has been treated with antibiotics -Procalcitonin remains elevated although may be some confounding from underlying ESRD -No sputum able to be collected.  Blood cultures remain negative.  No growth on urine culture either -Follow-up CT chest ordered per ID on 03/06/2020  Seizure [facility]) - Patient had seizure with lossof pulse and PEA arrest s/p CPR. - Patient was subsequently evaluated by neurology who restarted patient on Keppra.  - Plan is to discharge patient on Keppra 500 mg twice daily.  ABLA (acute blood loss anemia) - see normocytic anemia   Perinephric hematoma -Seen on CT abdomen/pelvis on 03/03/2020.  No impingement noted on kidney  HTN (hypertension) - Continue with nifedipine and labetalol as needed.  Intermittently  hypertensive.  Uremia - see AKI  Normocytic anemia - Developed acute onset anemia s/p kidney biopsy from perinephric hematoma.  Received 2 units of PRBCs 03/03/2020.  - Transfuse for hemoglobin less than 7.   -  Continue Aranesp per nephrology. - CBC daily   Asymptomatic bacteriuria-resolved as of 03/06/2020 -Not reporting symptoms -UA reviewed from 02/29/2020: Nitrite negative, moderate leukocyte esterase, no bacteria, WBC clumps, 11-20 RBCs - treated with CTX 2/26 - 3/2 and cefepime x 1 on 3/3  Acute on chronic respiratory failure with hypoxia (HCC)-resolved as of 03/06/2020 - s/p PEA arrest - extubated on 02/28/20; now on RA  Cardiac arrest with pulseless electrical activity (HCC)-resolved as of 03/06/2020 - s/p CPR and intubation   Ventilator dependence (HCC)-resolved as of 03/06/2020 - extubated 02/28/20   Old records reviewed in assessment of this patient  Antimicrobials: Rocephin 02/29/2020 -02/05/2020 Cefepime 03/05/2020 x 1 Vancomycin 03/05/2020 x 1  DVT prophylaxis: SCDs Start: 02/25/20 2009   Code Status:   Code Status: Full Code Family Communication: none present   Disposition Plan: Status is: Inpatient  Remains inpatient appropriate because:Ongoing diagnostic testing needed not appropriate for outpatient work up, IV treatments appropriate due to intensity of illness or inability to take PO and Inpatient level of care appropriate due to severity of illness   Dispo:  Patient From: Home  Planned Disposition: Home with Health Care Svc  Medically stable for discharge: No    Risk of unplanned readmission score: Unplanned Admission- Pilot do not use: 20.65   Objective: Blood pressure (!) 151/95, pulse (!) 126, temperature 99.8 F (37.7 C), temperature source Oral, resp. rate (!) 24, height 5' 2.99" (1.6 m), weight 76 kg, SpO2 92 %.  Examination: General appearance: alert, cooperative, no distress and weak and deconditioned in bed Head: Normocephalic, without obvious abnormality, atraumatic  Neck: Right neck noted with bandage in place from prior cath removed Eyes: EOMI Lungs: coarse sounds bilaterally Heart: Tachycardic, regular rhythm, S1-S2 present.  No appreciated  murmurs Abdomen: Obese, soft, nontender, nondistended, bowel sounds present Extremities: no edema Skin: mobility and turgor normal Neurologic: Grossly normal  Consultants:   ID  Nephrology  Procedures:     Data Reviewed: I have personally reviewed following labs and imaging studies Results for orders placed or performed during the hospital encounter of 02/25/20 (from the past 24 hour(s))  Procalcitonin     Status: None   Collection Time: 03/06/20  2:37 AM  Result Value Ref Range   Procalcitonin >150.00 ng/mL  Renal function panel     Status: Abnormal   Collection Time: 03/06/20  2:37 AM  Result Value Ref Range   Sodium 132 (L) 135 - 145 mmol/L   Potassium 4.7 3.5 - 5.1 mmol/L   Chloride 96 (L) 98 - 111 mmol/L   CO2 20 (L) 22 - 32 mmol/L   Glucose, Bld 62 (L) 70 - 99 mg/dL   BUN 35 (H) 8 - 23 mg/dL   Creatinine, Ser 6.36 (H) 0.44 - 1.00 mg/dL   Calcium 7.4 (L) 8.9 - 10.3 mg/dL   Phosphorus 4.9 (H) 2.5 - 4.6 mg/dL   Albumin 1.4 (L) 3.5 - 5.0 g/dL   GFR, Estimated 7 (L) >60 mL/min   Anion gap 16 (H) 5 - 15  CBC     Status: Abnormal   Collection Time: 03/06/20  2:37 AM  Result Value Ref Range   WBC 18.0 (H) 4.0 - 10.5 K/uL   RBC 2.75 (L) 3.87 - 5.11 MIL/uL  Hemoglobin 7.8 (L) 12.0 - 15.0 g/dL   HCT 24.6 (L) 36.0 - 46.0 %   MCV 89.5 80.0 - 100.0 fL   MCH 28.4 26.0 - 34.0 pg   MCHC 31.7 30.0 - 36.0 g/dL   RDW 15.4 11.5 - 15.5 %   Platelets 265 150 - 400 K/uL   nRBC 0.0 0.0 - 0.2 %  Glucose, capillary     Status: None   Collection Time: 03/06/20 11:46 AM  Result Value Ref Range   Glucose-Capillary 83 70 - 99 mg/dL  Glucose, capillary     Status: None   Collection Time: 03/06/20  4:26 PM  Result Value Ref Range   Glucose-Capillary 77 70 - 99 mg/dL    Recent Results (from the past 240 hour(s))  Resp Panel by RT-PCR (Flu A&B, Covid) Nasopharyngeal Swab     Status: None   Collection Time: 02/25/20  6:12 PM   Specimen: Nasopharyngeal Swab; Nasopharyngeal(NP) swabs  in vial transport medium  Result Value Ref Range Status   SARS Coronavirus 2 by RT PCR NEGATIVE NEGATIVE Final    Comment: (NOTE) SARS-CoV-2 target nucleic acids are NOT DETECTED.  The SARS-CoV-2 RNA is generally detectable in upper respiratory specimens during the acute phase of infection. The lowest concentration of SARS-CoV-2 viral copies this assay can detect is 138 copies/mL. A negative result does not preclude SARS-Cov-2 infection and should not be used as the sole basis for treatment or other patient management decisions. A negative result may occur with  improper specimen collection/handling, submission of specimen other than nasopharyngeal swab, presence of viral mutation(s) within the areas targeted by this assay, and inadequate number of viral copies(<138 copies/mL). A negative result must be combined with clinical observations, patient history, and epidemiological information. The expected result is Negative.  Fact Sheet for Patients:  EntrepreneurPulse.com.au  Fact Sheet for Healthcare Providers:  IncredibleEmployment.be  This test is no t yet approved or cleared by the [place] FDA and  has been authorized for detection and/or diagnosis of SARS-CoV-2 by FDA under an Emergency Use Authorization (EUA). This EUA will remain  in effect (meaning this test can be used) for the duration of the COVID-19 declaration under Section 564(b)(1) of the Act, 21 U.S.C.section 360bbb-3(b)(1), unless the authorization is terminated  or revoked sooner.       Influenza A by PCR NEGATIVE NEGATIVE Final   Influenza B by PCR NEGATIVE NEGATIVE Final    Comment: (NOTE) The Xpert Xpress SARS-CoV-2/FLU/RSV plus assay is intended as an aid in the diagnosis of influenza from Nasopharyngeal swab specimens and should not be used as a sole basis for treatment. Nasal washings and aspirates are unacceptable for Xpert Xpress  SARS-CoV-2/FLU/RSV testing.  Fact Sheet for Patients: EntrepreneurPulse.com.au  Fact Sheet for Healthcare Providers: IncredibleEmployment.be  This test is not yet approved or cleared by the [place] FDA and has been authorized for detection and/or diagnosis of SARS-CoV-2 by FDA under an Emergency Use Authorization (EUA). This EUA will remain in effect (meaning this test can be used) for the duration of the COVID-19 declaration under Section 564(b)(1) of the Act, 21 U.S.C. section 360bbb-3(b)(1), unless the authorization is terminated or revoked.  Performed at [facility], [place] [address], [place], [place] [address]   MRSA PCR Screening     Status: None   Collection Time: 02/26/20  4:00 PM   Specimen: Nasopharyngeal  Result Value Ref Range Status   MRSA by PCR NEGATIVE NEGATIVE Final    Comment:  The GeneXpert MRSA Assay (FDA approved for NASAL specimens only), is one component of a comprehensive MRSA colonization surveillance program. It is not intended to diagnose MRSA infection nor to guide or monitor treatment for MRSA infections. Performed at [facility], [place] [address], [place], [place] [address]   Culture, Urine     Status: None   Collection Time: 02/26/20  5:08 PM   Specimen: Urine, Random  Result Value Ref Range Status   Specimen Description URINE, RANDOM  Final   Special Requests NONE  Final   Culture   Final    NO GROWTH Performed at [facility], [place] [address] [place], [place] [address]    Report Status 02/27/2020 FINAL  Final  Culture, blood (routine x 2)     Status: None   Collection Time: 02/26/20 10:43 PM   Specimen: BLOOD  Result Value Ref Range Status   Specimen Description BLOOD SITE NOT SPECIFIED  Final   Special Requests   Final    AEROBIC BOTTLE ONLY Blood Culture results may not be optimal due to an inadequate volume of blood received in culture bottles   Culture   Final     NO GROWTH 5 DAYS Performed at [facility], [place] [address] [place], [place] [address]    Report Status 03/02/2020 FINAL  Final  Culture, blood (routine x 2)     Status: None   Collection Time: 02/26/20 10:43 PM   Specimen: BLOOD  Result Value Ref Range Status   Specimen Description BLOOD SITE NOT SPECIFIED  Final   Special Requests AEROBIC BOTTLE ONLY Blood Culture adequate volume  Final   Culture   Final    NO GROWTH 5 DAYS Performed at [place] [address], [place], [place] [address]    Report Status 03/02/2020 FINAL  Final  Urine Culture     Status: None   Collection Time: 03/04/20  5:30 AM   Specimen: Urine, Random  Result Value Ref Range Status   Specimen Description URINE, RANDOM  Final   Special Requests STERILE CUP  Final   Culture   Final    NO GROWTH Performed at [facility], [place] [address], [place], [place] [address]    Report Status 03/05/2020 FINAL  Final  Culture, blood (routine x 2)     Status: None (Preliminary result)   Collection Time: 03/04/20  8:48 PM   Specimen: BLOOD  Result Value Ref Range Status   Specimen Description BLOOD LEFT ANTECUBITAL  Final   Special Requests   Final    BOTTLES DRAWN AEROBIC AND ANAEROBIC Blood Culture results may not be optimal due to an inadequate volume of blood received in culture bottles   Culture   Final    NO GROWTH 2 DAYS Performed at [facility], [place] [address] [place], [place] [address]    Report Status PENDING  Incomplete  Culture, blood (routine x 2)     Status: None (Preliminary result)   Collection Time: 03/04/20  8:48 PM   Specimen: BLOOD LEFT HAND  Result Value Ref Range Status   Specimen Description BLOOD LEFT HAND  Final   Special Requests   Final    BOTTLES DRAWN AEROBIC AND ANAEROBIC Blood Culture results may not be optimal due to an inadequate volume of blood received in culture bottles   Culture   Final    NO GROWTH 2 DAYS Performed at [facility],  [address], [place], [place] [address]    Report Status PENDING  Incomplete     Radiology Studies: ECHOCARDIOGRAM COMPLETE  Result Date: 03/06/2020    ECHOCARDIOGRAM REPORT   Patient Name:   SHANTOYA GEURTS Date of Exam: 03/06/2020 Medical Rec #:  [id number]             Height:       63.0 in Accession #:    [id number]            Weight:       167.5 lb Date of Birth:  1955-12-25            BSA:          1.793 m Patient Age:    66 years              BP:           151/95 mmHg Patient Gender: F                     HR:           121 bpm. Exam Location:  Inpatient Procedure: 2D Echo, Cardiac Doppler and Color Doppler Indications:    Fever  History:        Patient has no prior history of Echocardiogram examinations.                 Risk Factors:Hypertension. H/O COVID-19 infection.  Sonographer:    Clayton Lefort RDCS (AE) Referring Phys: [id number] [place]  1. Left ventricular ejection fraction, by estimation, is 50 to 55%. The left ventricle has low normal function. The left ventricle has no regional wall motion abnormalities. There is moderate left ventricular hypertrophy. Indeterminate diastolic filling  due to E-A fusion. There is incoordinate septal motion.  2. Right ventricular systolic function is normal. The right ventricular size is normal. There is normal pulmonary artery systolic pressure. The estimated right ventricular systolic pressure is 09.6 mmHg.  3. Left atrial size was moderately dilated.  4. The pericardial effusion is posterior to the left ventricle.  5. The mitral valve is grossly normal. Trivial mitral valve regurgitation.  6. The aortic valve is tricuspid. Aortic valve regurgitation is not visualized. Mild aortic valve sclerosis is present, with no evidence of aortic valve stenosis.  7. The inferior vena cava is normal in size with greater than 50% respiratory variability, suggesting right atrial pressure of 3 mmHg. Comparison(s): No prior Echocardiogram.  Conclusion(s)/Recommendation(s): No evidence of valvular vegetations on this transthoracic echocardiogram. Would recommend a transesophageal echocardiogram to exclude infective endocarditis if clinically indicated. FINDINGS  Left Ventricle: Left ventricular ejection fraction, by estimation, is 50 to 55%. The left ventricle has low normal function. The left ventricle has no regional wall motion abnormalities. The left ventricular internal cavity size was normal in size. There is moderate left ventricular hypertrophy. Incoordinate septal motion. Indeterminate diastolic filling due to E-A fusion. Right Ventricle: The right ventricular size is normal. No increase in right ventricular wall thickness. Right ventricular systolic function is normal. There is normal pulmonary artery systolic pressure. The tricuspid regurgitant velocity is 2.62 m/s, and  with an assumed right atrial pressure of 3 mmHg, the estimated right ventricular systolic pressure is 28.3 mmHg. Left Atrium: Left atrial size was moderately dilated. Right Atrium: Right atrial size was normal in size. Pericardium: Trivial pericardial effusion is present. The pericardial effusion is posterior to the left ventricle. Mitral Valve: The mitral valve is  grossly normal. Trivial mitral valve regurgitation. Tricuspid Valve: The tricuspid valve is grossly normal. Tricuspid valve regurgitation is mild. Aortic Valve: The aortic valve is tricuspid. Aortic valve regurgitation is not visualized. Mild aortic valve sclerosis is present, with no evidence of aortic valve stenosis. Aortic valve mean gradient measures 9.0 mmHg. Aortic valve peak gradient measures 16.5 mmHg. Aortic valve area, by VTI measures 2.12 cm. Pulmonic Valve: The pulmonic valve was normal in structure. Pulmonic valve regurgitation is not visualized. Aorta: The aortic root and ascending aorta are structurally normal, with no evidence of dilitation. Venous: The inferior vena cava is normal in size with  greater than 50% respiratory variability, suggesting right atrial pressure of 3 mmHg. IAS/Shunts: No atrial level shunt detected by color flow Doppler. Additional Comments: There is a small pleural effusion in the left lateral region.  LEFT VENTRICLE PLAX 2D LVIDd:         4.10 cm LVIDs:         3.00 cm LV PW:         1.50 cm LV IVS:        1.40 cm LVOT diam:     2.10 cm LV SV:         66 LV SV Index:   37 LVOT Area:     3.46 cm  RIGHT VENTRICLE          IVC RV Basal diam:  3.40 cm  IVC diam: 1.60 cm LEFT ATRIUM             Index       RIGHT ATRIUM           Index LA diam:        2.60 cm 1.45 cm/m  RA Area:     17.80 cm LA Vol (A2C):   66.7 ml 37.19 ml/m RA Volume:   49.50 ml  27.60 ml/m LA Vol (A4C):   86.4 ml 48.17 ml/m LA Biplane Vol: 76.4 ml 42.60 ml/m  AORTIC VALVE AV Area (Vmax):    2.18 cm AV Area (Vmean):   2.03 cm AV Area (VTI):     2.12 cm AV Vmax:           203.00 cm/s AV Vmean:          145.000 cm/s AV VTI:            0.310 m AV Peak Grad:      16.5 mmHg AV Mean Grad:      9.0 mmHg LVOT Vmax:         128.00 cm/s LVOT Vmean:        84.900 cm/s LVOT VTI:          0.190 m LVOT/AV VTI ratio: 0.61  AORTA Ao Root diam: 3.00 cm Ao Asc diam:  3.20 cm TRICUSPID VALVE TR Peak grad:   27.5 mmHg TR Vmax:        262.00 cm/s  SHUNTS Systemic VTI:  0.19 m Systemic Diam: 2.10 cm Lyman Bishop MD Electronically signed by Lyman Bishop MD Signature Date/Time: 03/06/2020/3:22:26 PM    Final    CT ABDOMEN PELVIS WO CONTRAST  Final Result    US BIOPSY (KIDNEY)  Final Result    DG Chest 2 View  Final Result    DG CHEST PORT 1 VIEW  Final Result    MR BRAIN WO CONTRAST  Final Result    MR ANGIO HEAD WO CONTRAST  Final Result    CT HEAD WO CONTRAST  Final Result    DG Abd 1 View  Final Result    DG CHEST PORT 1 VIEW  Final Result    DG Chest Port 1 View  Final Result    IR Fluoro Guide CV Line Right  Final Result    IR US Guide Vasc Access Right  Final Result    US RENAL  Final  Result    CT Renal Stone Study  Final Result    CT CHEST WO CONTRAST    (Results Pending)  VAS [place] UPPER EXTREMITY VENOUS DUPLEX    (Results Pending)  VAS [place] LOWER EXTREMITY VENOUS (DVT)    (Results Pending)  IR Fluoro Guide CV Line Right    (Results Pending)    Scheduled Meds: . chlorhexidine gluconate (MEDLINE KIT)  15 mL Mouth Rinse BID  . Chlorhexidine Gluconate Cloth  6 each Topical Q0600  . Chlorhexidine Gluconate Cloth  6 each Topical Q0600  . dextromethorphan  30 mg Oral BID  . docusate sodium  100 mg Oral BID  . feeding supplement  237 mL Oral TID BM  . heparin sodium (porcine)      . levETIRAcetam  250 mg Oral Q T,Th,Sa-HD  . levETIRAcetam  500 mg Oral BID  . mouth rinse  15 mL Mouth Rinse BID  . multivitamin  1 tablet Oral QHS  . NIFEdipine  90 mg Oral Daily  . sodium chloride flush  10-40 mL Intracatheter Q12H   PRN Meds: sodium chloride, acetaminophen, antiseptic oral rinse, guaiFENesin, labetalol, LORazepam, melatonin, ondansetron **OR** ondansetron (ZOFRAN) IV, oxyCODONE, sodium chloride flush Continuous Infusions: . sodium chloride Stopped (02/28/20 1701)  . [START ON 03/09/2020] vancomycin       LOS: 10 days  Time spent: Greater than 50% of the 35 minute visit was spent in counseling/coordination of care for the patient as laid out in the A&P.   Dwyane Dee, MD [facility] 03/06/2020, 4:52 PM

## 2020-03-06 NOTE — Progress Notes (Signed)
Patient ID: Barbara Crawford, female   DOB: Jul 19, 1955, 65 y.o.   MRN: [id number] S: Remains tachycardic and febrile O:BP (!) 160/91 (BP Location: Right Arm)   Pulse (!) 117   Temp 99.1 F (37.3 C) (Oral)   Resp (!) 22   Ht 5' 2.99" (1.6 m)   Wt 76 kg   SpO2 95%   BMI 29.69 kg/m   Intake/Output Summary (Last 24 hours) at 03/06/2020 0833 Last data filed at 03/05/2020 1900 Gross per 24 hour  Intake 150 ml  Output --  Net 150 ml   Intake/Output: I/O last 3 completed shifts: In: 150 [P.O.:150] Out: -   Intake/Output this shift:  No intake/output data recorded. Weight change:  Gen: NAD CVS: tachy Resp: cta Abd: +Bs, soft, NT/ND Ext: no edema  Recent Labs  Lab 02/28/20 1556 02/29/20 0352 03/01/20 0050 03/02/20 0324 03/03/20 0543 03/04/20 0044 03/05/20 0807 03/06/20 0237  NA 134* 130* 135 135 135 135 132* 132*  K 4.4 4.6 4.0 4.3 4.0 3.9 4.3 4.7  CL 100 98 97* 99 98 99 98 96*  CO2 24 21* _0 20*  GLUCOSE 101* 93 101* 91 87 125* 71 62*  BUN 30* 35* 18 33* 16 24* 22 35*  CREATININE 5.20* 6.17* 4.31* 6.37* 3.90* 5.35* 5.19* 6.36*  ALBUMIN 1.6* 1.6* 1.4* 1.4* 1.3* 1.4* 1.3* 1.4*  CALCIUM 7.2* 7.2* 8.4* 7.7* 8.3* 7.7* 7.2* 7.4*  PHOS 3.4 3.1 3.7  --  3.0 3.7  --  4.9*  AST  --   --  103* 70*  --   --  44*  --   ALT  --   --  54* 53*  --   --  35  --    Liver Function Tests: Recent Labs  Lab 03/01/20 0050 03/02/20 0324 03/03/20 0543 03/04/20 0044 03/05/20 0807 03/06/20 0237  AST 103* 70*  --   --  44*  --   ALT 54* 53*  --   --  35  --   ALKPHOS 60 57  --   --  58  --   BILITOT 0.3 0.5  --   --  0.6  --   PROT 5.8* 5.7*  --   --  5.5*  --   ALBUMIN 1.4* 1.4*   < > 1.4* 1.3* 1.4*   < > = values in this interval not displayed.   No results for input(s): LIPASE, AMYLASE in the last 168 hours. No results for input(s): AMMONIA in the last 168 hours. CBC: Recent Labs  Lab 03/01/20 0050 03/02/20 0850 03/03/20 0726 03/03/20 1918 03/04/20 0044  03/04/20 0740 03/05/20 0721 03/05/20 0807 03/06/20 0237  WBC 15.3* 12.7* 9.6  --  8.8  --   --  18.3* 18.0*  NEUTROABS 13.1*  --   --   --   --   --   --   --   --   HGB 7.8* 7.9* 6.3*   < > 7.9*   < > 7.6* 7.8* 7.8*  HCT 23.2* 25.0* 20.2*   < > 24.4*   < > 24.1* 22.9* 24.6*  MCV 86.2 88.0 87.8  --  88.7  --   --  87.4 89.5  PLT 176 237 210  --  223  --   --  222 265   < > = values in this interval not displayed.   Cardiac Enzymes: No results for input(s): CKTOTAL, CKMB, CKMBINDEX, TROPONINI in the  last 168 hours. CBG: Recent Labs  Lab 02/28/20 2303 02/29/20 0308 02/29/20 0832 02/29/20 1114 03/02/20 1028  GLUCAP 87 84 97 115* 82    Iron Studies: No results for input(s): IRON, TIBC, TRANSFERRIN, FERRITIN in the last 72 hours. Studies/Results: No results found. . chlorhexidine gluconate (MEDLINE KIT)  15 mL Mouth Rinse BID  . Chlorhexidine Gluconate Cloth  6 each Topical Q0600  . Chlorhexidine Gluconate Cloth  6 each Topical Q0600  . dextromethorphan  30 mg Oral BID  . docusate sodium  100 mg Oral BID  . feeding supplement  237 mL Oral TID BM  . levETIRAcetam  250 mg Oral Q T,Th,Sa-HD  . levETIRAcetam  500 mg Oral BID  . mouth rinse  15 mL Mouth Rinse BID  . multivitamin  1 tablet Oral QHS  . NIFEdipine  90 mg Oral Daily  . sodium chloride flush  10-40 mL Intracatheter Q12H    BMET    Component Value Date/Time   NA 132 (L) 03/06/2020 0237   K 4.7 03/06/2020 0237   CL 96 (L) 03/06/2020 0237   CO2 20 (L) 03/06/2020 0237   GLUCOSE 62 (L) 03/06/2020 0237   BUN 35 (H) 03/06/2020 0237   CREATININE 6.36 (H) 03/06/2020 0237   CALCIUM 7.4 (L) 03/06/2020 0237   CALCIUM 6.2 (LL) 02/26/2020 0305   GFRNONAA 7 (L) 03/06/2020 0237   GFRAA >60 01/28/2016 1543   CBC    Component Value Date/Time   WBC 18.0 (H) 03/06/2020 0237   RBC 2.75 (L) 03/06/2020 0237   HGB 7.8 (L) 03/06/2020 0237   HCT 24.6 (L) 03/06/2020 0237   PLT 265 03/06/2020 0237   MCV 89.5 03/06/2020 0237    MCH 28.4 03/06/2020 0237   MCHC 31.7 03/06/2020 0237   RDW 15.4 03/06/2020 0237   LYMPHSABS 0.8 03/01/2020 0050   MONOABS 1.2 (H) 03/01/2020 0050   EOSABS 0.0 03/01/2020 0050   BASOSABS 0.1 03/01/2020 0050    Assessment/Plan:  1. 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 Appears was brewing in 12/2019 with PCP referring her to nephrology then, Cr was 1.8 at that time. Hep C neg, hep B s antigen neg. nontunneled catheter on 02/25/20. UA with >300 mg/dl protein and 0-5 RBC. ANA positive.ANCA negative. Complement normal. RPR nonreactive. Anti-GBM neg. ASO normal. SPEP no M spike. Free light chain ratio normal. Started on CRRT on 2/24 after nontunneled catheter with IR.IHD on 02/29/20. 1. S/prenal biopsy2/28/22 and awaiting preliminary result 2. S/p 1stHD3/1/22and 2nd3/2/22 and plan for 3rd tomorrow(no heparin with any treatment due to kidney biopsy) 3. Will need permanent access given oliguria and ongoing need for HD. 4. Will consult IR and discontinue temp HD catheter after HD (possible source of infection) and place [facility] on Monday if no recovery of renal function.  2. PEA arrest after temp HD catheter placed. Resolved and extubated. 3. HTN - ACEi on hold due to AKI 4. Vascular access- will need TDC and avf/avg placement this hospitalization if renal function does not recover soon. 1. S/p temp HD cath on 02/25/20 2. Will consult IR for [facility] and VVS for AVF/AVG next week if no recovery of renal function.  5. Anemia - improved s/p transfusion 6. Seizure activity - neurology consulted and felt to be related to metabolic derangements. On keppra per neuro. 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  Awaiting EM and stains.  Hold off on prednisone until infectious etiology is ruled  out.    Donetta Potts, MD [facility] [phone]

## 2020-03-06 NOTE — Assessment & Plan Note (Addendum)
-   Patient presented with elevation of BUN (144) and creat (29.98) -No prior known renal dysfunction and creatinine normal in 2018 - Renal US showed echogenic kidneys. CT renal stone study showed non-obstructive right nephrolithiasis. 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 ANA positive, no M spike detected on protein electrophoresis, ASO negative, C3 117, C4 35, ANCA negative,GBM antibody negative.RPR non-reactive.  dsDNA negative, SCL 70 Positive+ (1.9), SSA IgG > 8+, SSB normal,  Chromatin antibody positive.   - Status post renal biopsy 03/02/2020.  Developed a perinephric hematoma post biopsy.   - Continue hemodialysis per nephrology; awaiting formal biopsy results prior to decision on ESRD diagnosis - s/p removal of HD-cath on 03/06/20; tentative plan for permcath on 3/7

## 2020-03-06 NOTE — Procedures (Signed)
Echo attempted. Patient eating. Will attempt echo again as time permits.

## 2020-03-06 NOTE — Assessment & Plan Note (Addendum)
-  See fever work-up as well.  Extubated on 02/28/2020 -Has been treated with antibiotics -Procalcitonin remains elevated although may be some confounding from underlying ESRD -No sputum able to be collected.  Blood cultures remain negative.  No growth on urine culture either -CT chest ordered per ID on 03/06/2020: shows B/L effusions, atelectasis but no obvious infiltrates

## 2020-03-06 NOTE — Assessment & Plan Note (Addendum)
-   Developed acute onset anemia s/p kidney biopsy from perinephric hematoma.  Received 2 units of PRBCs 03/03/2020.  - Transfuse for hemoglobin less than 7.   - Continue Aranesp per nephrology. - CBC daily  - Hgb 6.6 g/dL on 3/6, s/p 1 unit PRBC; repeat Hgb 8.8 g/dL this am (3/7)

## 2020-03-06 NOTE — Progress Notes (Signed)
PT Cancellation Note  Patient Details Name: Barbara Crawford MRN: [id number] DOB: [birth year]   Cancelled Treatment:    Reason Eval/Treat Not Completed: Patient at procedure or test/unavailable (HD)   Maija B Prater 03/06/2020, 7:46 AM  Bayard Males, PT Acute Rehabilitation Services Pager: [phone] Office: [phone]

## 2020-03-06 NOTE — Assessment & Plan Note (Addendum)
-   afebrile now > 48 hours  - Differential broad: PNA vs atelectasis vs autoimmune mediated vs infected HD port (now removed 03/06/20) vs other - ID now on board; extensive workup underway - s/p HD cath removed on 3/4 - repeat blood cultures if recurrent fever - no abx for now per ID due to u/k source if any  -CT chest performed on 03/06/2020: Moderate bilateral pleural effusions, compressive atelectasis left greater than right, reactive mediastinal lymphadenopathy - 3/5: UE/LE duplex negative for DVTs -Incentive spirometer ordered

## 2020-03-06 NOTE — Assessment & Plan Note (Signed)
-  Seen on CT abdomen/pelvis on 03/03/2020.  No impingement noted on kidney

## 2020-03-06 NOTE — Assessment & Plan Note (Signed)
-   extubated 02/28/20

## 2020-03-06 NOTE — Assessment & Plan Note (Signed)
-   s/p PEA arrest - extubated on 02/28/20; now on RA

## 2020-03-06 NOTE — Assessment & Plan Note (Signed)
-   s/p CPR and intubation

## 2020-03-06 NOTE — Assessment & Plan Note (Signed)
-   see AKI - awaiting renal biopsy results

## 2020-03-06 NOTE — Progress Notes (Signed)
Subjective: No new complaints   Antibiotics:  Anti-infectives (From admission, onward)   Start     Dose/Rate Route Frequency Ordered Stop   03/06/20 1200  vancomycin (VANCOCIN) IVPB 750 mg/150 ml premix  Status:  Discontinued        750 mg 150 mL/hr over 60 Minutes Intravenous  Once 03/05/20 1426 03/05/20 1447   03/05/20 1800  vancomycin (VANCOCIN) IVPB 750 mg/150 ml premix  Status:  Discontinued        750 mg 150 mL/hr over 60 Minutes Intravenous  Once 03/05/20 0831 03/05/20 0834   03/05/20 0930  vancomycin (VANCOREADY) IVPB 1500 mg/300 mL  Status:  Discontinued        1,500 mg 150 mL/hr over 120 Minutes Intravenous  Once 03/05/20 0831 03/05/20 1447   03/05/20 0930  ceFEPIme (MAXIPIME) 1 g in sodium chloride 0.9 % 100 mL IVPB  Status:  Discontinued        1 g 200 mL/hr over 30 Minutes Intravenous Every 24 hours 03/05/20 0831 03/05/20 1447   02/29/20 1000  cefTRIAXone (ROCEPHIN) 2 g in sodium chloride 0.9 % 100 mL IVPB  Status:  Discontinued        2 g 200 mL/hr over 30 Minutes Intravenous Every 24 hours 02/29/20 0910 03/05/20 0756      Medications: Scheduled Meds: . chlorhexidine gluconate (MEDLINE KIT)  15 mL Mouth Rinse BID  . Chlorhexidine Gluconate Cloth  6 each Topical Q0600  . Chlorhexidine Gluconate Cloth  6 each Topical Q0600  . dextromethorphan  30 mg Oral BID  . docusate sodium  100 mg Oral BID  . feeding supplement  237 mL Oral TID BM  . heparin sodium (porcine)      . levETIRAcetam  250 mg Oral Q T,Th,Sa-HD  . levETIRAcetam  500 mg Oral BID  . mouth rinse  15 mL Mouth Rinse BID  . multivitamin  1 tablet Oral QHS  . NIFEdipine  90 mg Oral Daily  . sodium chloride flush  10-40 mL Intracatheter Q12H   Continuous Infusions: . sodium chloride Stopped (02/28/20 1701)   PRN Meds:.sodium chloride, acetaminophen, antiseptic oral rinse, guaiFENesin, labetalol, LORazepam, melatonin, ondansetron **OR** ondansetron (ZOFRAN) IV, oxyCODONE, sodium chloride  flush    Objective: Weight change:   Intake/Output Summary (Last 24 hours) at 03/06/2020 1526 Last data filed at 03/06/2020 1116 Gross per 24 hour  Intake 30 ml  Output 1284 ml  Net -1254 ml   Blood pressure (!) 151/95, pulse (!) 126, temperature 99.8 F (37.7 C), temperature source Oral, resp. rate (!) 24, height 5' 2.99" (1.6 m), weight 76 kg, SpO2 92 %. Temp:  [97.8 F (36.6 C)-101.1 F (38.4 C)] 99.8 F (37.7 C) (03/04 1147) Pulse Rate:  [117-126] 126 (03/04 1147) Resp:  [21-33] 24 (03/04 1147) BP: (106-160)/(67-97) 151/95 (03/04 1147) SpO2:  [92 %-98 %] 92 % (03/04 1147) Weight:  [76 kg] 76 kg (03/04 0723)  Physical Exam: Physical Exam Constitutional:      General: She is not in acute distress.    Appearance: She is well-developed and well-nourished. She is not diaphoretic.  HENT:     Head: Normocephalic and atraumatic.     Right Ear: External ear normal.     Left Ear: External ear normal.     Mouth/Throat:     Mouth: Oropharynx is clear and moist.     Pharynx: No oropharyngeal exudate.  Eyes:     General: No scleral icterus.  Extraocular Movements: EOM normal.     Conjunctiva/sclera: Conjunctivae normal.     Pupils: Pupils are equal, round, and reactive to light.  Cardiovascular:     Rate and Rhythm: Normal rate and regular rhythm.     Heart sounds: Normal heart sounds. No friction rub.  Pulmonary:     Effort: Pulmonary effort is normal. No respiratory distress.     Breath sounds: Normal breath sounds. No wheezing or rales.  Abdominal:     General: Bowel sounds are normal. There is no distension.     Palpations: Abdomen is soft.  Musculoskeletal:        General: No tenderness or edema. Normal range of motion.  Lymphadenopathy:     Cervical: No cervical adenopathy.  Skin:    General: Skin is warm and dry.     Coloration: Skin is not pale.     Findings: No erythema or rash.  Neurological:     General: No focal deficit present.     Mental Status: She is  alert and oriented to person, place, and time.     Motor: No abnormal muscle tone.     Coordination: Coordination normal.  Psychiatric:        Mood and Affect: Mood and affect and mood normal.        Behavior: Behavior normal.        Thought Content: Thought content normal.        Judgment: Judgment normal.      CBC:    BMET Recent Labs    03/05/20 0807 03/06/20 0237  NA 132* 132*  K 4.3 4.7  CL 98 96*  CO2 22 20*  GLUCOSE 71 62*  BUN 22 35*  CREATININE 5.19* 6.36*  CALCIUM 7.2* 7.4*     Liver Panel  Recent Labs    03/05/20 0807 03/06/20 0237  PROT 5.5*  --   ALBUMIN 1.3* 1.4*  AST 44*  --   ALT 35  --   ALKPHOS 58  --   BILITOT 0.6  --        Sedimentation Rate No results for input(s): ESRSEDRATE in the last 72 hours. C-Reactive Protein No results for input(s): CRP in the last 72 hours.  Micro Results: Recent Results (from the past 720 hour(s))  Resp Panel by RT-PCR (Flu A&B, Covid) Nasopharyngeal Swab     Status: None   Collection Time: 02/25/20  6:12 PM   Specimen: Nasopharyngeal Swab; Nasopharyngeal(NP) swabs in vial transport medium  Result Value Ref Range Status   SARS Coronavirus 2 by RT PCR NEGATIVE NEGATIVE Final    Comment: (NOTE) SARS-CoV-2 target nucleic acids are NOT DETECTED.  The SARS-CoV-2 RNA is generally detectable in upper respiratory specimens during the acute phase of infection. The lowest concentration of SARS-CoV-2 viral copies this assay can detect is 138 copies/mL. A negative result does not preclude SARS-Cov-2 infection and should not be used as the sole basis for treatment or other patient management decisions. A negative result may occur with  improper specimen collection/handling, submission of specimen other than nasopharyngeal swab, presence of viral mutation(s) within the areas targeted by this assay, and inadequate number of viral copies(<138 copies/mL). A negative result must be combined with clinical  observations, patient history, and epidemiological information. The expected result is Negative.  Fact Sheet for Patients:  EntrepreneurPulse.com.au  Fact Sheet for Healthcare Providers:  IncredibleEmployment.be  This test is no t yet approved or cleared by the [place] FDA and  has been  authorized for detection and/or diagnosis of SARS-CoV-2 by FDA under an Emergency Use Authorization (EUA). This EUA will remain  in effect (meaning this test can be used) for the duration of the COVID-19 declaration under Section 564(b)(1) of the Act, 21 U.S.C.section 360bbb-3(b)(1), unless the authorization is terminated  or revoked sooner.       Influenza A by PCR NEGATIVE NEGATIVE Final   Influenza B by PCR NEGATIVE NEGATIVE Final    Comment: (NOTE) The Xpert Xpress SARS-CoV-2/FLU/RSV plus assay is intended as an aid in the diagnosis of influenza from Nasopharyngeal swab specimens and should not be used as a sole basis for treatment. Nasal washings and aspirates are unacceptable for Xpert Xpress SARS-CoV-2/FLU/RSV testing.  Fact Sheet for Patients: EntrepreneurPulse.com.au  Fact Sheet for Healthcare Providers: IncredibleEmployment.be  This test is not yet approved or cleared by the [place] FDA and has been authorized for detection and/or diagnosis of SARS-CoV-2 by FDA under an Emergency Use Authorization (EUA). This EUA will remain in effect (meaning this test can be used) for the duration of the COVID-19 declaration under Section 564(b)(1) of the Act, 21 U.S.C. section 360bbb-3(b)(1), unless the authorization is terminated or revoked.  Performed at [facility], [place] [address], [place], [place] [address]   MRSA PCR Screening     Status: None   Collection Time: 02/26/20  4:00 PM   Specimen: Nasopharyngeal  Result Value Ref Range Status   MRSA by PCR NEGATIVE NEGATIVE Final    Comment:         The GeneXpert MRSA Assay (FDA approved for NASAL specimens only), is one component of a comprehensive MRSA colonization surveillance program. It is not intended to diagnose MRSA infection nor to guide or monitor treatment for MRSA infections. Performed at [facility], [place] [address] [place], [place] [address]   Culture, Urine     Status: None   Collection Time: 02/26/20  5:08 PM   Specimen: Urine, Random  Result Value Ref Range Status   Specimen Description URINE, RANDOM  Final   Special Requests NONE  Final   Culture   Final    NO GROWTH Performed at [facility], [place] [address], [place], [place] [address]    Report Status 02/27/2020 FINAL  Final  Culture, blood (routine x 2)     Status: None   Collection Time: 02/26/20 10:43 PM   Specimen: BLOOD  Result Value Ref Range Status   Specimen Description BLOOD SITE NOT SPECIFIED  Final   Special Requests   Final    AEROBIC BOTTLE ONLY Blood Culture results may not be optimal due to an inadequate volume of blood received in culture bottles   Culture   Final    NO GROWTH 5 DAYS Performed at [facility], [place] [address], [place], [place] [address]    Report Status 03/02/2020 FINAL  Final  Culture, blood (routine x 2)     Status: None   Collection Time: 02/26/20 10:43 PM   Specimen: BLOOD  Result Value Ref Range Status   Specimen Description BLOOD SITE NOT SPECIFIED  Final   Special Requests AEROBIC BOTTLE ONLY Blood Culture adequate volume  Final   Culture   Final    NO GROWTH 5 DAYS Performed at [place] [address] [place],  [address]    Report Status 03/02/2020 FINAL  Final  Urine Culture     Status: None   Collection Time: 03/04/20  5:30 AM   Specimen: Urine, Random  Result Value Ref Range Status   Specimen Description URINE, RANDOM  Final   Special Requests STERILE CUP  Final   Culture   Final    NO GROWTH Performed at [facility], [address], [place],  [place] [address]    Report Status 03/05/2020 FINAL  Final  Culture, blood (routine x 2)     Status: None (Preliminary result)   Collection Time: 03/04/20  8:48 PM   Specimen: BLOOD  Result Value Ref Range Status   Specimen Description BLOOD LEFT ANTECUBITAL  Final   Special Requests   Final    BOTTLES DRAWN AEROBIC AND ANAEROBIC Blood Culture results may not be optimal due to an inadequate volume of blood received in culture bottles   Culture   Final    NO GROWTH 2 DAYS Performed at [facility], [place] [address], [place], [place] [address]    Report Status PENDING  Incomplete  Culture, blood (routine x 2)     Status: None (Preliminary result)   Collection Time: 03/04/20  8:48 PM   Specimen: BLOOD LEFT HAND  Result Value Ref Range Status   Specimen Description BLOOD LEFT HAND  Final   Special Requests   Final    BOTTLES DRAWN AEROBIC AND ANAEROBIC Blood Culture results may not be optimal due to an inadequate volume of blood received in culture bottles   Culture   Final    NO GROWTH 2 DAYS Performed at [facility], [place] [address] [place], [place] [address]    Report Status PENDING  Incomplete    Studies/Results: ECHOCARDIOGRAM COMPLETE  Result Date: 03/06/2020    ECHOCARDIOGRAM REPORT   Patient Name:   Barbara Crawford Date of Exam: 03/06/2020 Medical Rec #:  [id number]             Height:       63.0 in Accession #:    [id number]            Weight:       167.5 lb Date of Birth:  [birth year]            BSA:          1.793 m Patient Age:    30 years              BP:           151/95 mmHg Patient Gender: F                     HR:           121 bpm. Exam Location:  Inpatient Procedure: 2D Echo, Cardiac Doppler and Color Doppler Indications:    Fever  History:        Patient has no prior history of Echocardiogram examinations.                 Risk Factors:Hypertension. H/O COVID-19 infection.  Sonographer:    Clayton Lefort RDCS (AE) Referring Phys: [id number] [place]  1. Left  ventricular ejection fraction, by estimation, is 50 to 55%. The left ventricle has low normal function. The left ventricle has no regional wall motion abnormalities. There is moderate left ventricular hypertrophy. Indeterminate diastolic filling  due to E-A fusion. There is incoordinate septal motion.  2. Right ventricular systolic function is normal. The right ventricular size is normal. There is normal pulmonary artery systolic pressure. The  estimated right ventricular systolic pressure is 87.5 mmHg.  3. Left atrial size was moderately dilated.  4. The pericardial effusion is posterior to the left ventricle.  5. The mitral valve is grossly normal. Trivial mitral valve regurgitation.  6. The aortic valve is tricuspid. Aortic valve regurgitation is not visualized. Mild aortic valve sclerosis is present, with no evidence of aortic valve stenosis.  7. The inferior vena cava is normal in size with greater than 50% respiratory variability, suggesting right atrial pressure of 3 mmHg. Comparison(s): No prior Echocardiogram. Conclusion(s)/Recommendation(s): No evidence of valvular vegetations on this transthoracic echocardiogram. Would recommend a transesophageal echocardiogram to exclude infective endocarditis if clinically indicated. FINDINGS  Left Ventricle: Left ventricular ejection fraction, by estimation, is 50 to 55%. The left ventricle has low normal function. The left ventricle has no regional wall motion abnormalities. The left ventricular internal cavity size was normal in size. There is moderate left ventricular hypertrophy. Incoordinate septal motion. Indeterminate diastolic filling due to E-A fusion. Right Ventricle: The right ventricular size is normal. No increase in right ventricular wall thickness. Right ventricular systolic function is normal. There is normal pulmonary artery systolic pressure. The tricuspid regurgitant velocity is 2.62 m/s, and  with an assumed right atrial pressure of 3 mmHg, the  estimated right ventricular systolic pressure is 64.3 mmHg. Left Atrium: Left atrial size was moderately dilated. Right Atrium: Right atrial size was normal in size. Pericardium: Trivial pericardial effusion is present. The pericardial effusion is posterior to the left ventricle. Mitral Valve: The mitral valve is grossly normal. Trivial mitral valve regurgitation. Tricuspid Valve: The tricuspid valve is grossly normal. Tricuspid valve regurgitation is mild. Aortic Valve: The aortic valve is tricuspid. Aortic valve regurgitation is not visualized. Mild aortic valve sclerosis is present, with no evidence of aortic valve stenosis. Aortic valve mean gradient measures 9.0 mmHg. Aortic valve peak gradient measures 16.5 mmHg. Aortic valve area, by VTI measures 2.12 cm. Pulmonic Valve: The pulmonic valve was normal in structure. Pulmonic valve regurgitation is not visualized. Aorta: The aortic root and ascending aorta are structurally normal, with no evidence of dilitation. Venous: The inferior vena cava is normal in size with greater than 50% respiratory variability, suggesting right atrial pressure of 3 mmHg. IAS/Shunts: No atrial level shunt detected by color flow Doppler. Additional Comments: There is a small pleural effusion in the left lateral region.  LEFT VENTRICLE PLAX 2D LVIDd:         4.10 cm LVIDs:         3.00 cm LV PW:         1.50 cm LV IVS:        1.40 cm LVOT diam:     2.10 cm LV SV:         66 LV SV Index:   37 LVOT Area:     3.46 cm  RIGHT VENTRICLE          IVC RV Basal diam:  3.40 cm  IVC diam: 1.60 cm LEFT ATRIUM             Index       RIGHT ATRIUM           Index LA diam:        2.60 cm 1.45 cm/m  RA Area:     17.80 cm LA Vol (A2C):   66.7 ml 37.19 ml/m RA Volume:   49.50 ml  27.60 ml/m LA Vol (A4C):   86.4 ml 48.17 ml/m LA Biplane Vol: 76.4  ml 42.60 ml/m  AORTIC VALVE AV Area (Vmax):    2.18 cm AV Area (Vmean):   2.03 cm AV Area (VTI):     2.12 cm AV Vmax:           203.00 cm/s AV Vmean:           145.000 cm/s AV VTI:            0.310 m AV Peak Grad:      16.5 mmHg AV Mean Grad:      9.0 mmHg LVOT Vmax:         128.00 cm/s LVOT Vmean:        84.900 cm/s LVOT VTI:          0.190 m LVOT/AV VTI ratio: 0.61  AORTA Ao Root diam: 3.00 cm Ao Asc diam:  3.20 cm TRICUSPID VALVE TR Peak grad:   27.5 mmHg TR Vmax:        262.00 cm/s  SHUNTS Systemic VTI:  0.19 m Systemic Diam: 2.10 cm Lyman Bishop MD Electronically signed by Lyman Bishop MD Signature Date/Time: 03/06/2020/3:22:26 PM    Final       Assessment/Plan:  INTERVAL HISTORY: patient having TTE   Principal Problem:   Acute kidney failure (HCC) Active Problems:   Anemia   Uremia   HTN (hypertension)   Ventilator dependence (HCC)   Perinephric hematoma   Seizure ([place])   Cardiac arrest with pulseless electrical activity (HCC)   Acute on chronic respiratory failure with hypoxia (HCC)   Acute lower UTI   Lobar pneumonia (HCC)   Sepsis (HCC) versus SIRS due to autoimmune process   Fever   Glomerulonephritis    Barbara Crawford is a 65 y.o. female with history of hypertension recent Covid infection admitted with progressive fatigue severe back pain and pain behind her shoulder blades lower extreme edema and found to have nephrotic syndrome with acute kidney injury.  She has had a fever during the course of her stay with negative work-up for an infectious cause of it.  She did have COVID-19 infection apparently in December.  She has been receiving dialysis.  She was on antibiotics were stopped yesterday given that we had no clear target for them.  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  I will send off an HIV RNA as well as serologies for CMV EBV parvovirus and a CMV viral load.  Her temporary catheter is being removed as a possible source of her fevers.  She has a 2D echocardiogram pending as well.   She has already had CT of the abdomen but not of the chest Dr Gale Journey was awaiting CT chest abdomen pelvis with contrast  if renal fxn not likely to recur but that is not clear at this time  I will order CT chest dopplers of UE and LE  I am skeptical that she has any acute or subacute bacterial process driving her symptom complex   My partner Dr. Linus Salmons is available for questions this weekend and I will return on Monday.   LOS: 10 days   Alcide Evener 03/06/2020, 3:26 PM

## 2020-03-07 ENCOUNTER — Inpatient Hospital Stay (HOSPITAL_COMMUNITY): Payer: 59

## 2020-03-07 DIAGNOSIS — R509 Fever, unspecified: Secondary | ICD-10-CM | POA: Diagnosis not present

## 2020-03-07 DIAGNOSIS — N179 Acute kidney failure, unspecified: Secondary | ICD-10-CM | POA: Diagnosis not present

## 2020-03-07 DIAGNOSIS — D62 Acute posthemorrhagic anemia: Secondary | ICD-10-CM | POA: Diagnosis not present

## 2020-03-07 DIAGNOSIS — J90 Pleural effusion, not elsewhere classified: Secondary | ICD-10-CM | POA: Diagnosis not present

## 2020-03-07 LAB — CBC WITH DIFFERENTIAL/PLATELET
Abs Immature Granulocytes: 0.13 10*3/uL — ABNORMAL HIGH (ref 0.00–0.07)
Basophils Absolute: 0 10*3/uL (ref 0.0–0.1)
Basophils Relative: 0 %
Eosinophils Absolute: 0.1 10*3/uL (ref 0.0–0.5)
Eosinophils Relative: 1 %
HCT: 21.2 % — ABNORMAL LOW (ref 36.0–46.0)
Hemoglobin: 7.2 g/dL — ABNORMAL LOW (ref 12.0–15.0)
Immature Granulocytes: 1 %
Lymphocytes Relative: 8 %
Lymphs Abs: 0.9 10*3/uL (ref 0.7–4.0)
MCH: 29.6 pg (ref 26.0–34.0)
MCHC: 34 g/dL (ref 30.0–36.0)
MCV: 87.2 fL (ref 80.0–100.0)
Monocytes Absolute: 1.6 10*3/uL — ABNORMAL HIGH (ref 0.1–1.0)
Monocytes Relative: 13 %
Neutro Abs: 9.4 10*3/uL — ABNORMAL HIGH (ref 1.7–7.7)
Neutrophils Relative %: 77 %
Platelets: 247 10*3/uL (ref 150–400)
RBC: 2.43 MIL/uL — ABNORMAL LOW (ref 3.87–5.11)
RDW: 15 % (ref 11.5–15.5)
WBC: 12.1 10*3/uL — ABNORMAL HIGH (ref 4.0–10.5)
nRBC: 0 % (ref 0.0–0.2)

## 2020-03-07 LAB — BASIC METABOLIC PANEL
Anion gap: 9 (ref 5–15)
BUN: 18 mg/dL (ref 8–23)
CO2: 25 mmol/L (ref 22–32)
Calcium: 7.1 mg/dL — ABNORMAL LOW (ref 8.9–10.3)
Chloride: 98 mmol/L (ref 98–111)
Creatinine, Ser: 4.47 mg/dL — ABNORMAL HIGH (ref 0.44–1.00)
GFR, Estimated: 10 mL/min — ABNORMAL LOW (ref >60)
Glucose, Bld: 85 mg/dL (ref 70–99)
Potassium: 3.5 mmol/L (ref 3.5–5.1)
Sodium: 132 mmol/L — ABNORMAL LOW (ref 135–145)

## 2020-03-07 LAB — CMV IGM: CMV IgM: <30 AU/mL (ref 0.0–29.9)

## 2020-03-07 LAB — HEMOGLOBIN AND HEMATOCRIT, BLOOD
HCT: 24.1 % — ABNORMAL LOW (ref 36.0–46.0)
Hemoglobin: 7.8 g/dL — ABNORMAL LOW (ref 12.0–15.0)

## 2020-03-07 LAB — GLUCOSE, CAPILLARY
Glucose-Capillary: 81 mg/dL (ref 70–99)
Glucose-Capillary: 96 mg/dL (ref 70–99)
Glucose-Capillary: 105 mg/dL — ABNORMAL HIGH (ref 70–99)

## 2020-03-07 LAB — HIV-1 RNA QUANT-NO REFLEX-BLD
HIV 1 RNA Quant: <20 copies/mL
LOG10 HIV-1 RNA: UNDETERMINED log10copy/mL

## 2020-03-07 LAB — PHOSPHORUS: Phosphorus: 3.6 mg/dL (ref 2.5–4.6)

## 2020-03-07 LAB — CMV ANTIBODY, IGG (EIA): CMV Ab - IgG: <0.6 U/mL (ref 0.00–0.59)

## 2020-03-07 LAB — MAGNESIUM: Magnesium: 1.9 mg/dL (ref 1.7–2.4)

## 2020-03-07 LAB — PROCALCITONIN: Procalcitonin: >150 ng/mL

## 2020-03-07 MED ORDER — METOPROLOL TARTRATE 25 MG PO TABS
25.0000 mg | ORAL_TABLET | Freq: Two times a day (BID) | ORAL | Status: DC
  Administered 2020-03-07 – 2020-03-08 (×3): 25 mg via ORAL
  Filled 2020-03-07 (×3): qty 1

## 2020-03-07 NOTE — Progress Notes (Signed)
VASCULAR LAB    Bilateral upper and lower extremity venous Dopplers have been performed.  See CV proc for preliminary results.   KANADY, CANDACE, RVT 03/07/2020, 12:35 PM

## 2020-03-07 NOTE — Progress Notes (Signed)
Patient ID: Barbara Crawford, female   DOB: [birth year], 65 y.o.   MRN: [id number] S: Feels better today but still tachycardic. O:BP 128/85 (BP Location: Right Arm)   Pulse (!) 110   Temp 99 F (37.2 C) (Oral)   Resp (!) 22   Ht 5' 2.99" (1.6 m)   Wt 75.1 kg   SpO2 97%   BMI 29.34 kg/m   Intake/Output Summary (Last 24 hours) at 03/07/2020 1045 Last data filed at 03/07/2020 0600 Gross per 24 hour  Intake 240 ml  Output 1384 ml  Net -1144 ml   Intake/Output: I/O last 3 completed shifts: In: 240 [P.O.:240] Out: 4580 [Urine:100; DXIPJ:8250]  Intake/Output this shift:  No intake/output data recorded. Weight change:  Gen: NAD CVS: tachy Resp: CTA Abd: +BS,s oft, NT/ND Ext: no edema  Recent Labs  Lab 03/01/20 0050 03/02/20 0324 03/03/20 0543 03/04/20 0044 03/05/20 0807 03/06/20 0237 03/07/20 0110  NA 135 135 135 135 132* 132* 132*  K 4.0 4.3 4.0 3.9 4.3 4.7 3.5  CL 97* 99 98 99 98 96* 98  CO2 26 25 24 23 22  20* 25  GLUCOSE 101* 91 87 125* 71 62* 85  BUN 18 33* 16 24* 22 35* 18  CREATININE 4.31* 6.37* 3.90* 5.35* 5.19* 6.36* 4.47*  ALBUMIN 1.4* 1.4* 1.3* 1.4* 1.3* 1.4*  --   CALCIUM 8.4* 7.7* 8.3* 7.7* 7.2* 7.4* 7.1*  PHOS 3.7  --  3.0 3.7  --  4.9* 3.6  AST 103* 70*  --   --  44*  --   --   ALT 54* 53*  --   --  35  --   --    Liver Function Tests: Recent Labs  Lab 03/01/20 0050 03/02/20 0324 03/03/20 0543 03/04/20 0044 03/05/20 0807 03/06/20 0237  AST 103* 70*  --   --  44*  --   ALT 54* 53*  --   --  35  --   ALKPHOS 60 57  --   --  58  --   BILITOT 0.3 0.5  --   --  0.6  --   PROT 5.8* 5.7*  --   --  5.5*  --   ALBUMIN 1.4* 1.4*   < > 1.4* 1.3* 1.4*   < > = values in this interval not displayed.   No results for input(s): LIPASE, AMYLASE in the last 168 hours. No results for input(s): AMMONIA in the last 168 hours. CBC: Recent Labs  Lab 03/01/20 0050 03/02/20 0850 03/03/20 0726 03/03/20 1918 03/04/20 0044 03/04/20 0740 03/05/20 0807  03/06/20 0237 03/07/20 0110  WBC 15.3*   < > 9.6  --  8.8  --  18.3* 18.0* 12.1*  NEUTROABS 13.1*  --   --   --   --   --   --   --  9.4*  HGB 7.8*   < > 6.3*   < > 7.9*   < > 7.8* 7.8* 7.2*  HCT 23.2*   < > 20.2*   < > 24.4*   < > 22.9* 24.6* 21.2*  MCV 86.2   < > 87.8  --  88.7  --  87.4 89.5 87.2  PLT 176   < > 210  --  223  --  222 265 247   < > = values in this interval not displayed.   Cardiac Enzymes: No results for input(s): CKTOTAL, CKMB, CKMBINDEX, TROPONINI in the last 168 hours. CBG: Recent Labs  Lab 03/02/20 1028 03/06/20 1146 03/06/20 1626 03/06/20 2111 03/07/20 0808  GLUCAP 82 83 77 103* 81    Iron Studies: No results for input(s): IRON, TIBC, TRANSFERRIN, FERRITIN in the last 72 hours. Studies/Results: CT CHEST WO CONTRAST  Result Date: 03/06/2020 CLINICAL DATA:  Fever of unknown origin. Recent COVID infection. Progressive fatigue and back pain. Recent renal biopsy. EXAM: CT CHEST WITHOUT CONTRAST TECHNIQUE: Multidetector CT imaging of the chest was performed following the standard protocol without IV contrast. COMPARISON:  Lung bases from abdominal CT 03/03/2020. Chest radiograph 03/01/2020 FINDINGS: Cardiovascular: Moderate aortic atherosclerosis. Mild cardiomegaly. Trace pericardial fluid. Decreased density of cardiac blood pool consistent with provided history of anemia. Mediastinum/Nodes: Shotty mediastinal lymph nodes with prominent nodes measuring up to 9 mm short axis. Limited assessment for hilar adenopathy on this noncontrast exam. Small bilateral axillary lymph nodes not enlarged by size criteria. No thyroid nodule. Decompressed esophagus. Lungs/Pleura: Moderate bilateral pleural effusions. Pleural effusion on the right is not significantly changed, pleural effusion on the left has increased in size. Associated compressive atelectasis in the left greater than right lower lobe. No other focal airspace disease. No findings of pulmonary edema. Trachea and central  bronchi are patent. Upper Abdomen: Left perinephric hematoma, mild improvement. Cholecystectomy. Musculoskeletal: Generalized subcutaneous body wall edema. Minimal thoracic spondylosis with endplate spurring. There are no acute or suspicious osseous abnormalities. IMPRESSION: 1. Moderate bilateral pleural effusions with compressive atelectasis in the left greater than right lower lobe. Pleural effusion on the right is not significantly changed from recent abdominal CT, pleural effusion on the left has increased in size. 2. Shotty mediastinal lymph nodes are likely reactive. 3. Generalized subcutaneous body wall edema. 4. Mild cardiomegaly. 5. Improving left perinephric hematoma. Aortic Atherosclerosis (ICD10-I70.0). Electronically Signed   By: Barbara Crawford M.D.   On: 03/06/2020 17:26   ECHOCARDIOGRAM COMPLETE  Result Date: 03/06/2020    ECHOCARDIOGRAM REPORT   Patient Name:   Barbara Crawford Date of Exam: 03/06/2020 Medical Rec #:  [id number]             Height:       63.0 in Accession #:    [id number]            Weight:       167.5 lb Date of Birth:  [birth year]            BSA:          1.793 m Patient Age:    62 years              BP:           151/95 mmHg Patient Gender: F                     HR:           121 bpm. Exam Location:  Inpatient Procedure: 2D Echo, Cardiac Doppler and Color Doppler Indications:    Fever  History:        Patient has no prior history of Echocardiogram examinations.                 Risk Factors:Hypertension. H/O COVID-19 infection.  Sonographer:    Barbara Crawford RDCS (AE) Referring Phys: [id number] [place]  1. Left ventricular ejection fraction, by estimation, is 50 to 55%. The left ventricle has low normal function. The left ventricle has no regional wall motion abnormalities. There is moderate left ventricular hypertrophy. Indeterminate diastolic filling  due to  E-A fusion. There is incoordinate septal motion.  2. Right ventricular systolic function is normal. The  right ventricular size is normal. There is normal pulmonary artery systolic pressure. The estimated right ventricular systolic pressure is 31.5 mmHg.  3. Left atrial size was moderately dilated.  4. The pericardial effusion is posterior to the left ventricle.  5. The mitral valve is grossly normal. Trivial mitral valve regurgitation.  6. The aortic valve is tricuspid. Aortic valve regurgitation is not visualized. Mild aortic valve sclerosis is present, with no evidence of aortic valve stenosis.  7. The inferior vena cava is normal in size with greater than 50% respiratory variability, suggesting right atrial pressure of 3 mmHg. Comparison(s): No prior Echocardiogram. Conclusion(s)/Recommendation(s): No evidence of valvular vegetations on this transthoracic echocardiogram. Would recommend a transesophageal echocardiogram to exclude infective endocarditis if clinically indicated. FINDINGS  Left Ventricle: Left ventricular ejection fraction, by estimation, is 50 to 55%. The left ventricle has low normal function. The left ventricle has no regional wall motion abnormalities. The left ventricular internal cavity size was normal in size. There is moderate left ventricular hypertrophy. Incoordinate septal motion. Indeterminate diastolic filling due to E-A fusion. Right Ventricle: The right ventricular size is normal. No increase in right ventricular wall thickness. Right ventricular systolic function is normal. There is normal pulmonary artery systolic pressure. The tricuspid regurgitant velocity is 2.62 m/s, and  with an assumed right atrial pressure of 3 mmHg, the estimated right ventricular systolic pressure is 17.6 mmHg. Left Atrium: Left atrial size was moderately dilated. Right Atrium: Right atrial size was normal in size. Pericardium: Trivial pericardial effusion is present. The pericardial effusion is posterior to the left ventricle. Mitral Valve: The mitral valve is grossly normal. Trivial mitral valve  regurgitation. Tricuspid Valve: The tricuspid valve is grossly normal. Tricuspid valve regurgitation is mild. Aortic Valve: The aortic valve is tricuspid. Aortic valve regurgitation is not visualized. Mild aortic valve sclerosis is present, with no evidence of aortic valve stenosis. Aortic valve mean gradient measures 9.0 mmHg. Aortic valve peak gradient measures 16.5 mmHg. Aortic valve area, by VTI measures 2.12 cm. Pulmonic Valve: The pulmonic valve was normal in structure. Pulmonic valve regurgitation is not visualized. Aorta: The aortic root and ascending aorta are structurally normal, with no evidence of dilitation. Venous: The inferior vena cava is normal in size with greater than 50% respiratory variability, suggesting right atrial pressure of 3 mmHg. IAS/Shunts: No atrial level shunt detected by color flow Doppler. Additional Comments: There is a small pleural effusion in the left lateral region.  LEFT VENTRICLE PLAX 2D LVIDd:         4.10 cm LVIDs:         3.00 cm LV PW:         1.50 cm LV IVS:        1.40 cm LVOT diam:     2.10 cm LV SV:         66 LV SV Index:   37 LVOT Area:     3.46 cm  RIGHT VENTRICLE          IVC RV Basal diam:  3.40 cm  IVC diam: 1.60 cm LEFT ATRIUM             Index       RIGHT ATRIUM           Index LA diam:        2.60 cm 1.45 cm/m  RA Area:     17.80 cm LA  Vol [facility]):   66.7 ml 37.19 ml/m RA Volume:   49.50 ml  27.60 ml/m LA Vol (A4C):   86.4 ml 48.17 ml/m LA Biplane Vol: 76.4 ml 42.60 ml/m  AORTIC VALVE AV Area (Vmax):    2.18 cm AV Area (Vmean):   2.03 cm AV Area (VTI):     2.12 cm AV Vmax:           203.00 cm/s AV Vmean:          145.000 cm/s AV VTI:            0.310 m AV Peak Grad:      16.5 mmHg AV Mean Grad:      9.0 mmHg LVOT Vmax:         128.00 cm/s LVOT Vmean:        84.900 cm/s LVOT VTI:          0.190 m LVOT/AV VTI ratio: 0.61  AORTA Ao Root diam: 3.00 cm Ao Asc diam:  3.20 cm TRICUSPID VALVE TR Peak grad:   27.5 mmHg TR Vmax:        262.00 cm/s  SHUNTS  Systemic VTI:  0.19 m Systemic Diam: 2.10 cm Lyman Bishop MD Electronically signed by Lyman Bishop MD Signature Date/Time: 03/06/2020/3:22:26 PM    Final    . chlorhexidine gluconate (MEDLINE KIT)  15 mL Mouth Rinse BID  . Chlorhexidine Gluconate Cloth  6 each Topical Q0600  . Chlorhexidine Gluconate Cloth  6 each Topical Q0600  . dextromethorphan  30 mg Oral BID  . docusate sodium  100 mg Oral BID  . feeding supplement  237 mL Oral TID BM  . levETIRAcetam  250 mg Oral Q T,Th,Sa-HD  . levETIRAcetam  500 mg Oral BID  . mouth rinse  15 mL Mouth Rinse BID  . multivitamin  1 tablet Oral QHS  . NIFEdipine  90 mg Oral Daily  . sodium chloride flush  10-40 mL Intracatheter Q12H    BMET    Component Value Date/Time   NA 132 (L) 03/07/2020 0110   K 3.5 03/07/2020 0110   CL 98 03/07/2020 0110   CO2 25 03/07/2020 0110   GLUCOSE 85 03/07/2020 0110   BUN 18 03/07/2020 0110   CREATININE 4.47 (H) 03/07/2020 0110   CALCIUM 7.1 (L) 03/07/2020 0110   CALCIUM 6.2 (LL) 02/26/2020 0305   GFRNONAA 10 (L) 03/07/2020 0110   GFRAA >60 01/28/2016 1543   CBC    Component Value Date/Time   WBC 12.1 (H) 03/07/2020 0110   RBC 2.43 (L) 03/07/2020 0110   HGB 7.2 (L) 03/07/2020 0110   HCT 21.2 (L) 03/07/2020 0110   PLT 247 03/07/2020 0110   MCV 87.2 03/07/2020 0110   MCH 29.6 03/07/2020 0110   MCHC 34.0 03/07/2020 0110   RDW 15.0 03/07/2020 0110   LYMPHSABS 0.9 03/07/2020 0110   MONOABS 1.6 (H) 03/07/2020 0110   EOSABS 0.1 03/07/2020 0110   BASOSABS 0.0 03/07/2020 0110    Assessment/Plan:  1. 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 Appears was brewing in 12/2019 with PCP referring her to nephrology then, Cr was 1.8 at that time. Hep C neg, hep B s antigen neg. nontunneled catheter on 02/25/20. UA with >300 mg/dl protein and 0-5 RBC. ANA positive.ANCA negative. Complement normal. RPR nonreactive. Anti-GBM neg. ASO normal. SPEP no M spike. Free light chain ratio normal.  Started on CRRT on 2/24 after nontunneled catheter with IR.IHD on 02/29/20. 1. S/prenal biopsy2/28/22and awaiting preliminary result  2. S/p 1stHD3/1/22and 2nd3/2/22 and plan for 3rd tomorrow(no heparinwith any treatment due to kidney biopsy) 3. Will need permanent access given oliguria and ongoing need for HD. 4. Appreciate IR who removed temp HD catheter 03/06/20 and will place [facility] on Monday if no recovery of renal function.  2. PEA arrest after temp HD catheter placed. Resolved and extubated. 3. ID - pt with low grade fevers and ongoing tachycardia.  ID following.  HD cath removed for line holiday.  Off abx and fever curve trending down. 4. HTN - ACEi on hold due to AKI 5. Vascular access- will need TDC and avf/avg placement this hospitalization if renal function does not recover soon. 1. S/p temp HD cath on 02/25/20 2. Will consult IR for [facility] and [facility] for AVF/AVG next week if no recovery of renal function.  6. Anemia - improved s/p transfusion 7. Seizure activity - neurology consulted and felt to be related to metabolic derangements. On keppra per neuro. 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  Awaiting EM and stains.  Hold off on prednisone until infectious etiology is ruled out.    Donetta Potts, MD [employer] [phone]

## 2020-03-07 NOTE — Assessment & Plan Note (Addendum)
-   moderate sized on CT on 3/4; no obvious HF on echo (EF 50-55%). Etiology possibly due to third spacing (albumin 1.4)  - given poor UOP likely to not respond well to lasix/albumin challenge  - may benefit from change in diet; will consult RD - continue on HD for now and will monitor change in effusion with periodic CXR or if patient starts becoming SOB/hypoxic (currently on RA) - follow up lasix response started 3/6 per nephrology; may benefit from adding albumin as well (will discuss with nephrology)

## 2020-03-07 NOTE — Progress Notes (Signed)
PROGRESS NOTE    Barbara Crawford   WUG:[id number]  DOB: 04/24/1955  DOA: 02/25/2020     11  PCP: Vonna Drafts, FNP  CC: LE swelling, malaise/fatigue  Hospital Course: Barbara Crawford is a 65 yo female with PMH HTN, prior Covid (Dec 2021) who presented to the hospital with complaints of lower extremity swelling, back pain, generalized malaise/fatigue at home. She underwent extensive work-up and is followed by nephrology, ID.  In the ED, she was found to have a hemoglobin of 6.3, potassium 5.4, sodium 130, bicarb 9, BUN 144, and creatinine 29.98.  CT of the abdomen and pelvis was negative for obstruction or acute abnormality.  Temporary hemodialysis catheter was placed by IR and subsequently the patient had a PEA arrest associated with seizure-like activity.  She was intubated and subsequently extubated 02/28/2020.    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 Renal biopsy done 03/03/2019.  ANA positive.  ID is following for fevers and infectious etiology possibly underlying.   Trialysis cath removed on 03/06/20.   See below for problem based plans.    Interval History:  No events overnight.  Has remained afebrile since yesterday.  Remains on room air and still taking somewhat shallow breaths due to soreness in her chest from recent chest compressions.  Also endorsing slightly productive cough of white/yellow sputum. When asked about voiding, she states only voiding about as much as she is drinking however denied voiding enough to fill up half of a Styrofoam cup.  ROS: Constitutional: positive for fatigue and malaise, Respiratory: negative for wheezing, Cardiovascular: negative for chest pain and Gastrointestinal: negative for abdominal pain  Assessment & Plan: * AKI (acute kidney injury) ([place]) - Patient presented with elevation of BUN (144) and creat (29.98) -No prior known renal dysfunction and creatinine normal in 2018 - Renal US showed  echogenic kidneys. CT renal stone study showed non-obstructive right nephrolithiasis. 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 ANA positive, no M spike detected on protein electrophoresis, ASO negative, C3 117, C4 35, ANCA negative,GBM antibody negative.RPR non-reactive.  dsDNA negative, SCL 70 Positive+ (1.9), SSA IgG > 8+, SSB normal,  Chromatin antibody positive.   - Status post renal biopsy 03/02/2020.  Developed a perinephric hematoma post biopsy.  Continue hemodialysis per nephrology.  - s/p removal of HD-cath on 03/06/20; tentative plan for permcath on 3/7  Fever - Differential broad: PNA vs atelectasis vs autoimmune mediated vs infected HD port (now removed 03/06/20) vs other - ID now on board; extensive workup underway - s/p HD cath removed on 3/4 - repeat blood cultures if recurrent fever - follow up pending imaging studies and labs - no abx for now per ID due to u/k source if any  -CT chest performed on 03/06/2020: Moderate bilateral pleural effusions, compressive atelectasis left greater than right, reactive mediastinal lymphadenopathy - 3/5: UE/LE duplex negative for DVTs (prelim read) -Incentive spirometer ordered  Pleural effusion - moderate sized on CT on 3/4; no obvious HF on echo (EF 50-55%). Etiology possibly due to third spacing (albumin 1.4)  - given poor UOP likely to not respond well to lasix/albumin challenge  - may benefit from change in diet; will consult RD - continue on HD for now and will monitor change in effusion with periodic CXR or if patient starts becoming SOB/hypoxic (currently on RA)  Glomerulonephritis - see AKI - awaiting renal biopsy results   Lobar pneumonia ([place]) -See fever work-up as well.  Extubated on 02/28/2020 -  Has been treated with antibiotics -Procalcitonin remains elevated although may be some confounding from underlying ESRD -No sputum able to be collected.  Blood cultures remain negative.  No growth on urine culture either -Follow-up CT  chest ordered per ID on 03/06/2020: shows B/L effusions, atelectasis but no obvious infiltrates  Normocytic anemia - Developed acute onset anemia s/p kidney biopsy from perinephric hematoma.  Received 2 units of PRBCs 03/03/2020.  - Transfuse for hemoglobin less than 7.   - Continue Aranesp per nephrology. - CBC daily  - Hgb 7.2 g/dL this am (3/5), repeat H/H this afternoon, transfuse if Hgb<7  Seizure ([place]) - Patient had seizure with lossof pulse and PEA arrest s/p CPR. - Patient was subsequently evaluated by neurology who restarted patient on Keppra.  - Plan is to discharge patient on Keppra 500 mg twice daily.  ABLA (acute blood loss anemia) - see normocytic anemia   Perinephric hematoma -Seen on CT abdomen/pelvis on 03/03/2020.  No impingement noted on kidney  HTN (hypertension) - Continue with nifedipine and labetalol as needed.  Intermittently hypertensive.  Uremia - see AKI  Asymptomatic bacteriuria-resolved as of 03/06/2020 -Not reporting symptoms -UA reviewed from 02/29/2020: Nitrite negative, moderate leukocyte esterase, no bacteria, WBC clumps, 11-20 RBCs - treated with CTX 2/26 - 3/2 and cefepime x 1 on 3/3  Acute on chronic respiratory failure with hypoxia (HCC)-resolved as of 03/06/2020 - s/p PEA arrest - extubated on 02/28/20; now on RA  Cardiac arrest with pulseless electrical activity (HCC)-resolved as of 03/06/2020 - s/p CPR and intubation   Ventilator dependence (HCC)-resolved as of 03/06/2020 - extubated 02/28/20   Old records reviewed in assessment of this patient  Antimicrobials: Rocephin 02/29/2020 -02/05/2020 Cefepime 03/05/2020 x 1 Vancomycin 03/05/2020 x 1  DVT prophylaxis: SCDs Start: 02/25/20 2009   Code Status:   Code Status: Full Code Family Communication: none present   Disposition Plan: Status is: Inpatient  Remains inpatient appropriate because:Ongoing diagnostic testing needed not appropriate for outpatient work up, IV treatments  appropriate due to intensity of illness or inability to take PO and Inpatient level of care appropriate due to severity of illness   Dispo:  Patient From: Home  Planned Disposition: Home with Health Care Svc  Medically stable for discharge: No    Risk of unplanned readmission score: Unplanned Admission- Pilot do not use: 17.45   Objective: Blood pressure 128/85, pulse (!) 110, temperature 99 F (37.2 C), temperature source Oral, resp. rate (!) 22, height 5' 2.99" (1.6 m), weight 75.1 kg, SpO2 97 %.  Examination: General appearance: alert, cooperative, no distress and weak and deconditioned in bed Head: Normocephalic, without obvious abnormality, atraumatic  Neck: Right neck noted with bandage in place from prior cath removed Eyes: EOMI Lungs: coarse sounds bilaterally Heart: Tachycardic, regular rhythm, S1-S2 present.  No appreciated murmurs Abdomen: Obese, soft, nontender, nondistended, bowel sounds present Extremities: no edema Skin: mobility and turgor normal Neurologic: Grossly normal  Consultants:   ID  Nephrology  Procedures:     Data Reviewed: I have personally reviewed following labs and imaging studies Results for orders placed or performed during the hospital encounter of 02/25/20 (from the past 24 hour(s))  Glucose, capillary     Status: None   Collection Time: 03/06/20  4:26 PM  Result Value Ref Range   Glucose-Capillary 77 70 - 99 mg/dL  Glucose, capillary     Status: Abnormal   Collection Time: 03/06/20  9:11 PM  Result Value Ref Range   Glucose-Capillary 103 (  H) 70 - 99 mg/dL  Procalcitonin     Status: None   Collection Time: 03/07/20  1:10 AM  Result Value Ref Range   Procalcitonin >150.00 ng/mL  Basic metabolic panel     Status: Abnormal   Collection Time: 03/07/20  1:10 AM  Result Value Ref Range   Sodium 132 (L) 135 - 145 mmol/L   Potassium 3.5 3.5 - 5.1 mmol/L   Chloride 98 98 - 111 mmol/L   CO2 25 22 - 32 mmol/L   Glucose, Bld 85 70 - 99  mg/dL   BUN 18 8 - 23 mg/dL   Creatinine, Ser 4.47 (H) 0.44 - 1.00 mg/dL   Calcium 7.1 (L) 8.9 - 10.3 mg/dL   GFR, Estimated 10 (L) >60 mL/min   Anion gap 9 5 - 15  CBC with Differential/Platelet     Status: Abnormal   Collection Time: 03/07/20  1:10 AM  Result Value Ref Range   WBC 12.1 (H) 4.0 - 10.5 K/uL   RBC 2.43 (L) 3.87 - 5.11 MIL/uL   Hemoglobin 7.2 (L) 12.0 - 15.0 g/dL   HCT 21.2 (L) 36.0 - 46.0 %   MCV 87.2 80.0 - 100.0 fL   MCH 29.6 26.0 - 34.0 pg   MCHC 34.0 30.0 - 36.0 g/dL   RDW 15.0 11.5 - 15.5 %   Platelets 247 150 - 400 K/uL   nRBC 0.0 0.0 - 0.2 %   Neutrophils Relative % 77 %   Neutro Abs 9.4 (H) 1.7 - 7.7 K/uL   Lymphocytes Relative 8 %   Lymphs Abs 0.9 0.7 - 4.0 K/uL   Monocytes Relative 13 %   Monocytes Absolute 1.6 (H) 0.1 - 1.0 K/uL   Eosinophils Relative 1 %   Eosinophils Absolute 0.1 0.0 - 0.5 K/uL   Basophils Relative 0 %   Basophils Absolute 0.0 0.0 - 0.1 K/uL   Immature Granulocytes 1 %   Abs Immature Granulocytes 0.13 (H) 0.00 - 0.07 K/uL  Magnesium     Status: None   Collection Time: 03/07/20  1:10 AM  Result Value Ref Range   Magnesium 1.9 1.7 - 2.4 mg/dL  Phosphorus     Status: None   Collection Time: 03/07/20  1:10 AM  Result Value Ref Range   Phosphorus 3.6 2.5 - 4.6 mg/dL  Glucose, capillary     Status: None   Collection Time: 03/07/20  8:08 AM  Result Value Ref Range   Glucose-Capillary 81 70 - 99 mg/dL  Hemoglobin and hematocrit, blood     Status: Abnormal   Collection Time: 03/07/20  3:22 PM  Result Value Ref Range   Hemoglobin 7.8 (L) 12.0 - 15.0 g/dL   HCT 24.1 (L) 36.0 - 46.0 %    Recent Results (from the past 240 hour(s))  MRSA PCR Screening     Status: None   Collection Time: 02/26/20  4:00 PM   Specimen: Nasopharyngeal  Result Value Ref Range Status   MRSA by PCR NEGATIVE NEGATIVE Final    Comment:        The GeneXpert MRSA Assay (FDA approved for NASAL specimens only), is one component of a comprehensive MRSA  colonization surveillance program. It is not intended to diagnose MRSA infection nor to guide or monitor treatment for MRSA infections. Performed at [facility], [place] [address], [place], [place] [address]   Culture, Urine     Status: None   Collection Time: 02/26/20  5:08 PM  Specimen: Urine, Random  Result Value Ref Range Status   Specimen Description URINE, RANDOM  Final   Special Requests NONE  Final   Culture   Final    NO GROWTH Performed at [facility], [address], [place], [place] [address]    Report Status 02/27/2020 FINAL  Final  Culture, blood (routine x 2)     Status: None   Collection Time: 02/26/20 10:43 PM   Specimen: BLOOD  Result Value Ref Range Status   Specimen Description BLOOD SITE NOT SPECIFIED  Final   Special Requests   Final    AEROBIC BOTTLE ONLY Blood Culture results may not be optimal due to an inadequate volume of blood received in culture bottles   Culture   Final    NO GROWTH 5 DAYS Performed at [facility], [place] [address] [place], [place] [address]    Report Status 03/02/2020 FINAL  Final  Culture, blood (routine x 2)     Status: None   Collection Time: 02/26/20 10:43 PM   Specimen: BLOOD  Result Value Ref Range Status   Specimen Description BLOOD SITE NOT SPECIFIED  Final   Special Requests AEROBIC BOTTLE ONLY Blood Culture adequate volume  Final   Culture   Final    NO GROWTH 5 DAYS Performed at [facility] [address] [place], [place] [address]    Report Status 03/02/2020 FINAL  Final  Urine Culture     Status: None   Collection Time: 03/04/20  5:30 AM   Specimen: Urine, Random  Result Value Ref Range Status   Specimen Description URINE, RANDOM  Final   Special Requests STERILE CUP  Final   Culture   Final    NO GROWTH Performed at [facility], [place] [address] [place], [place] [address]    Report Status 03/05/2020 FINAL  Final  Culture, blood (routine x 2)     Status: None (Preliminary  result)   Collection Time: 03/04/20  8:48 PM   Specimen: BLOOD  Result Value Ref Range Status   Specimen Description BLOOD LEFT ANTECUBITAL  Final   Special Requests   Final    BOTTLES DRAWN AEROBIC AND ANAEROBIC Blood Culture results may not be optimal due to an inadequate volume of blood received in culture bottles   Culture   Final    NO GROWTH 3 DAYS Performed at [facility], [place] [address] [place], [place] [address]    Report Status PENDING  Incomplete  Culture, blood (routine x 2)     Status: None (Preliminary result)   Collection Time: 03/04/20  8:48 PM   Specimen: BLOOD LEFT HAND  Result Value Ref Range Status   Specimen Description BLOOD LEFT HAND  Final   Special Requests   Final    BOTTLES DRAWN AEROBIC AND ANAEROBIC Blood Culture results may not be optimal due to an inadequate volume of blood received in culture bottles   Culture   Final    NO GROWTH 3 DAYS Performed at [facility], [place] [address] [place],  [address]    Report Status PENDING  Incomplete     Radiology Studies: CT CHEST WO CONTRAST  Result Date: 03/06/2020 CLINICAL DATA:  Fever of unknown origin. Recent COVID infection. Progressive fatigue and back pain. Recent renal biopsy. EXAM: CT CHEST WITHOUT CONTRAST TECHNIQUE: Multidetector CT imaging of the chest was performed following the standard protocol without IV contrast. COMPARISON:  Lung bases from abdominal CT 03/03/2020. Chest radiograph 03/01/2020 FINDINGS: Cardiovascular: Moderate aortic atherosclerosis. Mild cardiomegaly. Trace pericardial fluid. Decreased density of cardiac blood pool consistent with provided history of anemia. Mediastinum/Nodes: Shotty mediastinal lymph nodes with prominent nodes measuring up to 9 mm short axis. Limited assessment for hilar adenopathy on this noncontrast exam. Small bilateral axillary lymph nodes not enlarged by size criteria. No thyroid nodule. Decompressed esophagus. Lungs/Pleura: Moderate  bilateral pleural effusions. Pleural effusion on the right is not significantly changed, pleural effusion on the left has increased in size. Associated compressive atelectasis in the left greater than right lower lobe. No other focal airspace disease. No findings of pulmonary edema. Trachea and central bronchi are patent. Upper Abdomen: Left perinephric hematoma, mild improvement. Cholecystectomy. Musculoskeletal: Generalized subcutaneous body wall edema. Minimal thoracic spondylosis with endplate spurring. There are no acute or suspicious osseous abnormalities. IMPRESSION: 1. Moderate bilateral pleural effusions with compressive atelectasis in the left greater than right lower lobe. Pleural effusion on the right is not significantly changed from recent abdominal CT, pleural effusion on the left has increased in size. 2. Shotty mediastinal lymph nodes are likely reactive. 3. Generalized subcutaneous body wall edema. 4. Mild cardiomegaly. 5. Improving left perinephric hematoma. Aortic Atherosclerosis (ICD10-I70.0). Electronically Signed   By: Keith Rake M.D.   On: 03/06/2020 17:26   ECHOCARDIOGRAM COMPLETE  Result Date: 03/06/2020    ECHOCARDIOGRAM REPORT   Patient Name:   SPENCER PETERKIN Date of Exam: 03/06/2020 Medical Rec #:  [id number]             Height:       63.0 in Accession #:    [id number]            Weight:       167.5 lb Date of Birth:  1955/03/08            BSA:          1.793 m Patient Age:    50 years              BP:           151/95 mmHg Patient Gender: F                     HR:           121 bpm. Exam Location:  Inpatient Procedure: 2D Echo, Cardiac Doppler and Color Doppler Indications:    Fever  History:        Patient has no prior history of Echocardiogram examinations.                 Risk Factors:Hypertension. H/O COVID-19 infection.  Sonographer:    Clayton Lefort RDCS (AE) Referring Phys: [id number] [place]  1. Left ventricular ejection fraction, by estimation, is 50 to  55%. The left ventricle has low normal function. The left ventricle has no regional wall motion abnormalities. There is moderate left ventricular hypertrophy. Indeterminate diastolic filling  due to E-A fusion. There is incoordinate septal motion.  2. Right ventricular systolic function is normal. The right ventricular size is normal. There is normal pulmonary artery systolic pressure. The estimated right ventricular systolic pressure is 76.5 mmHg.  3. Left atrial size was moderately dilated.  4. The pericardial effusion is posterior to the left ventricle.  5. The mitral valve is grossly normal. Trivial mitral valve regurgitation.  6. The aortic valve is tricuspid. Aortic valve regurgitation is not visualized. Mild aortic valve sclerosis  is present, with no evidence of aortic valve stenosis.  7. The inferior vena cava is normal in size with greater than 50% respiratory variability, suggesting right atrial pressure of 3 mmHg. Comparison(s): No prior Echocardiogram. Conclusion(s)/Recommendation(s): No evidence of valvular vegetations on this transthoracic echocardiogram. Would recommend a transesophageal echocardiogram to exclude infective endocarditis if clinically indicated. FINDINGS  Left Ventricle: Left ventricular ejection fraction, by estimation, is 50 to 55%. The left ventricle has low normal function. The left ventricle has no regional wall motion abnormalities. The left ventricular internal cavity size was normal in size. There is moderate left ventricular hypertrophy. Incoordinate septal motion. Indeterminate diastolic filling due to E-A fusion. Right Ventricle: The right ventricular size is normal. No increase in right ventricular wall thickness. Right ventricular systolic function is normal. There is normal pulmonary artery systolic pressure. The tricuspid regurgitant velocity is 2.62 m/s, and  with an assumed right atrial pressure of 3 mmHg, the estimated right ventricular systolic pressure is 53.6 mmHg.  Left Atrium: Left atrial size was moderately dilated. Right Atrium: Right atrial size was normal in size. Pericardium: Trivial pericardial effusion is present. The pericardial effusion is posterior to the left ventricle. Mitral Valve: The mitral valve is grossly normal. Trivial mitral valve regurgitation. Tricuspid Valve: The tricuspid valve is grossly normal. Tricuspid valve regurgitation is mild. Aortic Valve: The aortic valve is tricuspid. Aortic valve regurgitation is not visualized. Mild aortic valve sclerosis is present, with no evidence of aortic valve stenosis. Aortic valve mean gradient measures 9.0 mmHg. Aortic valve peak gradient measures 16.5 mmHg. Aortic valve area, by VTI measures 2.12 cm. Pulmonic Valve: The pulmonic valve was normal in structure. Pulmonic valve regurgitation is not visualized. Aorta: The aortic root and ascending aorta are structurally normal, with no evidence of dilitation. Venous: The inferior vena cava is normal in size with greater than 50% respiratory variability, suggesting right atrial pressure of 3 mmHg. IAS/Shunts: No atrial level shunt detected by color flow Doppler. Additional Comments: There is a small pleural effusion in the left lateral region.  LEFT VENTRICLE PLAX 2D LVIDd:         4.10 cm LVIDs:         3.00 cm LV PW:         1.50 cm LV IVS:        1.40 cm LVOT diam:     2.10 cm LV SV:         66 LV SV Index:   37 LVOT Area:     3.46 cm  RIGHT VENTRICLE          IVC RV Basal diam:  3.40 cm  IVC diam: 1.60 cm LEFT ATRIUM             Index       RIGHT ATRIUM           Index LA diam:        2.60 cm 1.45 cm/m  RA Area:     17.80 cm LA Vol (A2C):   66.7 ml 37.19 ml/m RA Volume:   49.50 ml  27.60 ml/m LA Vol (A4C):   86.4 ml 48.17 ml/m LA Biplane Vol: 76.4 ml 42.60 ml/m  AORTIC VALVE AV Area (Vmax):    2.18 cm AV Area (Vmean):   2.03 cm AV Area (VTI):     2.12 cm AV Vmax:           203.00 cm/s AV Vmean:          145.000  cm/s AV VTI:            0.310 m AV Peak  Grad:      16.5 mmHg AV Mean Grad:      9.0 mmHg LVOT Vmax:         128.00 cm/s LVOT Vmean:        84.900 cm/s LVOT VTI:          0.190 m LVOT/AV VTI ratio: 0.61  AORTA Ao Root diam: 3.00 cm Ao Asc diam:  3.20 cm TRICUSPID VALVE TR Peak grad:   27.5 mmHg TR Vmax:        262.00 cm/s  SHUNTS Systemic VTI:  0.19 m Systemic Diam: 2.10 cm Lyman Bishop MD Electronically signed by Lyman Bishop MD Signature Date/Time: 03/06/2020/3:22:26 PM    Final    VAS [place] LOWER EXTREMITY VENOUS (DVT)  Result Date: 03/07/2020  Lower Venous DVT Study Indications: Fever of unknown origin.  Comparison Study: No prior study Performing Technologist: Sharion Dove RVS  Examination Guidelines: A complete evaluation includes B-mode imaging, spectral Doppler, color Doppler, and power Doppler as needed of all accessible portions of each vessel. Bilateral testing is considered an integral part of a complete examination. Limited examinations for reoccurring indications may be performed as noted. The reflux portion of the exam is performed with the patient in reverse Trendelenburg.  +---------+---------------+---------+-----------+----------+--------------+ RIGHT    CompressibilityPhasicitySpontaneityPropertiesThrombus Aging +---------+---------------+---------+-----------+----------+--------------+ CFV      Full           Yes      Yes                                 +---------+---------------+---------+-----------+----------+--------------+ SFJ      Full                                                        +---------+---------------+---------+-----------+----------+--------------+ FV Prox  Full                                                        +---------+---------------+---------+-----------+----------+--------------+ FV Mid   Full                                                        +---------+---------------+---------+-----------+----------+--------------+ FV DistalFull                                                         +---------+---------------+---------+-----------+----------+--------------+ PFV      Full                                                        +---------+---------------+---------+-----------+----------+--------------+  POP      Full           Yes      Yes                                 +---------+---------------+---------+-----------+----------+--------------+ PTV      Full                                                        +---------+---------------+---------+-----------+----------+--------------+ PERO     Full                                                        +---------+---------------+---------+-----------+----------+--------------+   +---------+---------------+---------+-----------+----------+--------------+ LEFT     CompressibilityPhasicitySpontaneityPropertiesThrombus Aging +---------+---------------+---------+-----------+----------+--------------+ CFV      Full           Yes      Yes                                 +---------+---------------+---------+-----------+----------+--------------+ SFJ      Full                                                        +---------+---------------+---------+-----------+----------+--------------+ FV Prox  Full                                                        +---------+---------------+---------+-----------+----------+--------------+ FV Mid   Full                                                        +---------+---------------+---------+-----------+----------+--------------+ FV DistalFull                                                        +---------+---------------+---------+-----------+----------+--------------+ PFV      Full                                                        +---------+---------------+---------+-----------+----------+--------------+ POP      Full           Yes      Yes                                  +---------+---------------+---------+-----------+----------+--------------+  PTV      Full                                                        +---------+---------------+---------+-----------+----------+--------------+ PERO     Full                                                        +---------+---------------+---------+-----------+----------+--------------+     Summary: BILATERAL: - No evidence of deep vein thrombosis seen in the lower extremities, bilaterally. -No evidence of popliteal cyst, bilaterally.   *See table(s) above for measurements and observations.    Preliminary    VAS [place] UPPER EXTREMITY VENOUS DUPLEX  Result Date: 03/07/2020 UPPER VENOUS STUDY  Indications: fever of unknown origin Limitations: Bandage at right neck. Comparison Study: No prior study Performing Technologist: Sharion Dove RVS  Examination Guidelines: A complete evaluation includes B-mode imaging, spectral Doppler, color Doppler, and power Doppler as needed of all accessible portions of each vessel. Bilateral testing is considered an integral part of a complete examination. Limited examinations for reoccurring indications may be performed as noted.  Right Findings: +----------+------------+---------+-----------+----------+-------+ RIGHT     CompressiblePhasicitySpontaneousPropertiesSummary +----------+------------+---------+-----------+----------+-------+ IJV                      Yes       Yes                      +----------+------------+---------+-----------+----------+-------+ Subclavian               Yes       Yes                      +----------+------------+---------+-----------+----------+-------+ Axillary                 Yes       Yes                      +----------+------------+---------+-----------+----------+-------+ Brachial      Full       Yes       Yes                      +----------+------------+---------+-----------+----------+-------+ Radial        Full                                           +----------+------------+---------+-----------+----------+-------+ Ulnar         Full                                          +----------+------------+---------+-----------+----------+-------+ Cephalic      Full                                          +----------+------------+---------+-----------+----------+-------+ Basilic  Full                                          +----------+------------+---------+-----------+----------+-------+  Left Findings: +----------+------------+---------+-----------+----------+-------+ LEFT      CompressiblePhasicitySpontaneousPropertiesSummary +----------+------------+---------+-----------+----------+-------+ IJV           Full       Yes       Yes                      +----------+------------+---------+-----------+----------+-------+ Subclavian               Yes       Yes                      +----------+------------+---------+-----------+----------+-------+ Axillary                 Yes       Yes                      +----------+------------+---------+-----------+----------+-------+ Brachial      Full       Yes       Yes                      +----------+------------+---------+-----------+----------+-------+ Radial        Full                                          +----------+------------+---------+-----------+----------+-------+ Ulnar         Full                                          +----------+------------+---------+-----------+----------+-------+ Cephalic      Full                                          +----------+------------+---------+-----------+----------+-------+  Summary:  Right: No evidence of deep vein thrombosis in the upper extremity. No evidence of superficial vein thrombosis in the upper extremity.  Left: No evidence of deep vein thrombosis in the upper extremity. No evidence of superficial vein thrombosis in the upper extremity.  *See  table(s) above for measurements and observations.    Preliminary    VAS [place] LOWER EXTREMITY VENOUS (DVT)      VAS [place] UPPER EXTREMITY VENOUS DUPLEX      CT CHEST WO CONTRAST  Final Result    CT ABDOMEN PELVIS WO CONTRAST  Final Result    US BIOPSY (KIDNEY)  Final Result    DG Chest 2 View  Final Result    DG CHEST PORT 1 VIEW  Final Result    MR BRAIN WO CONTRAST  Final Result    MR ANGIO HEAD WO CONTRAST  Final Result    CT HEAD WO CONTRAST  Final Result    DG Abd 1 View  Final Result    DG CHEST PORT 1 VIEW  Final Result    DG Chest Port 1 View  Final Result    IR Fluoro Guide CV Line Right  Final Result  IR US Guide Vasc Access Right  Final Result    US RENAL  Final Result    CT Renal Stone Study  Final Result    IR Fluoro Guide CV Line Right    (Results Pending)    Scheduled Meds: . chlorhexidine gluconate (MEDLINE KIT)  15 mL Mouth Rinse BID  . Chlorhexidine Gluconate Cloth  6 each Topical Q0600  . Chlorhexidine Gluconate Cloth  6 each Topical Q0600  . dextromethorphan  30 mg Oral BID  . docusate sodium  100 mg Oral BID  . feeding supplement  237 mL Oral TID BM  . levETIRAcetam  250 mg Oral Q T,Th,Sa-HD  . levETIRAcetam  500 mg Oral BID  . mouth rinse  15 mL Mouth Rinse BID  . multivitamin  1 tablet Oral QHS  . NIFEdipine  90 mg Oral Daily  . sodium chloride flush  10-40 mL Intracatheter Q12H   PRN Meds: sodium chloride, acetaminophen, antiseptic oral rinse, guaiFENesin, labetalol, LORazepam, melatonin, ondansetron **OR** ondansetron (ZOFRAN) IV, oxyCODONE, sodium chloride flush Continuous Infusions: . sodium chloride Stopped (02/28/20 1701)  . [START ON 03/09/2020] vancomycin       LOS: 11 days  Time spent: Greater than 50% of the 35 minute visit was spent in counseling/coordination of care for the patient as laid out in the A&P.   Dwyane Dee, MD [facility] 03/07/2020, 3:55 PM

## 2020-03-08 DIAGNOSIS — N179 Acute kidney failure, unspecified: Secondary | ICD-10-CM | POA: Diagnosis not present

## 2020-03-08 DIAGNOSIS — J90 Pleural effusion, not elsewhere classified: Secondary | ICD-10-CM | POA: Diagnosis not present

## 2020-03-08 LAB — CBC WITH DIFFERENTIAL/PLATELET
Abs Immature Granulocytes: 0.14 10*3/uL — ABNORMAL HIGH (ref 0.00–0.07)
Basophils Absolute: 0 10*3/uL (ref 0.0–0.1)
Basophils Relative: 0 %
Eosinophils Absolute: 0.1 10*3/uL (ref 0.0–0.5)
Eosinophils Relative: 1 %
HCT: 20 % — ABNORMAL LOW (ref 36.0–46.0)
Hemoglobin: 6.6 g/dL — CL (ref 12.0–15.0)
Immature Granulocytes: 1 %
Lymphocytes Relative: 9 %
Lymphs Abs: 1 10*3/uL (ref 0.7–4.0)
MCH: 29.2 pg (ref 26.0–34.0)
MCHC: 33 g/dL (ref 30.0–36.0)
MCV: 88.5 fL (ref 80.0–100.0)
Monocytes Absolute: 1.5 10*3/uL — ABNORMAL HIGH (ref 0.1–1.0)
Monocytes Relative: 13 %
Neutro Abs: 9 10*3/uL — ABNORMAL HIGH (ref 1.7–7.7)
Neutrophils Relative %: 76 %
Platelets: 278 10*3/uL (ref 150–400)
RBC: 2.26 MIL/uL — ABNORMAL LOW (ref 3.87–5.11)
RDW: 14.9 % (ref 11.5–15.5)
WBC: 11.8 10*3/uL — ABNORMAL HIGH (ref 4.0–10.5)
nRBC: 0 % (ref 0.0–0.2)

## 2020-03-08 LAB — BASIC METABOLIC PANEL
Anion gap: 11 (ref 5–15)
BUN: 35 mg/dL — ABNORMAL HIGH (ref 8–23)
CO2: 24 mmol/L (ref 22–32)
Calcium: 7.1 mg/dL — ABNORMAL LOW (ref 8.9–10.3)
Chloride: 98 mmol/L (ref 98–111)
Creatinine, Ser: 6.48 mg/dL — ABNORMAL HIGH (ref 0.44–1.00)
GFR, Estimated: 7 mL/min — ABNORMAL LOW (ref >60)
Glucose, Bld: 89 mg/dL (ref 70–99)
Potassium: 3.6 mmol/L (ref 3.5–5.1)
Sodium: 133 mmol/L — ABNORMAL LOW (ref 135–145)

## 2020-03-08 LAB — QUANTIFERON-TB GOLD PLUS (RQFGPL)
QuantiFERON Mitogen Value: 0.03 IU/mL
QuantiFERON Nil Value: 0.01 IU/mL
QuantiFERON TB1 Ag Value: 0.01 IU/mL
QuantiFERON TB2 Ag Value: 0.02 IU/mL

## 2020-03-08 LAB — EPSTEIN-BARR VIRUS (EBV) ANTIBODY PROFILE
EBV NA IgG: 64.6 U/mL — ABNORMAL HIGH (ref 0.0–17.9)
EBV VCA IgG: 184 U/mL — ABNORMAL HIGH (ref 0.0–17.9)
EBV VCA IgM: <36 U/mL (ref 0.0–35.9)

## 2020-03-08 LAB — QUANTIFERON-TB GOLD PLUS: QuantiFERON-TB Gold Plus: UNDETERMINED — AB

## 2020-03-08 LAB — GLUCOSE, CAPILLARY
Glucose-Capillary: 83 mg/dL (ref 70–99)
Glucose-Capillary: 95 mg/dL (ref 70–99)
Glucose-Capillary: 108 mg/dL — ABNORMAL HIGH (ref 70–99)

## 2020-03-08 LAB — PHOSPHORUS: Phosphorus: 4.3 mg/dL (ref 2.5–4.6)

## 2020-03-08 LAB — HEMOGLOBIN AND HEMATOCRIT, BLOOD
HCT: 30.7 % — ABNORMAL LOW (ref 36.0–46.0)
Hemoglobin: 10.3 g/dL — ABNORMAL LOW (ref 12.0–15.0)

## 2020-03-08 LAB — PREPARE RBC (CROSSMATCH)

## 2020-03-08 LAB — OCCULT BLOOD X 1 CARD TO LAB, STOOL: Fecal Occult Bld: POSITIVE — AB

## 2020-03-08 LAB — MAGNESIUM: Magnesium: 1.9 mg/dL (ref 1.7–2.4)

## 2020-03-08 MED ORDER — ALBUMIN HUMAN 25 % IV SOLN
25.0000 g | Freq: Four times a day (QID) | INTRAVENOUS | Status: DC
  Administered 2020-03-08: 25 g via INTRAVENOUS
  Filled 2020-03-08 (×2): qty 100

## 2020-03-08 MED ORDER — SODIUM CHLORIDE 0.9% IV SOLUTION: Freq: Once | INTRAVENOUS | Status: DC

## 2020-03-08 MED ORDER — FUROSEMIDE 10 MG/ML IJ SOLN
80.0000 mg | Freq: Once | INTRAMUSCULAR | Status: AC
  Administered 2020-03-08: 80 mg via INTRAVENOUS
  Filled 2020-03-08: qty 8

## 2020-03-08 MED ORDER — ALBUMIN HUMAN 25 % IV SOLN
25.0000 g | Freq: Every day | INTRAVENOUS | Status: AC
  Administered 2020-03-09 – 2020-03-10 (×2): 25 g via INTRAVENOUS
  Filled 2020-03-08 (×2): qty 100

## 2020-03-08 NOTE — Progress Notes (Signed)
PROGRESS NOTE    Barbara Crawford   ONG:[id number]  DOB: 12/19/1955  DOA: 02/25/2020     12  PCP: Vonna Drafts, FNP  CC: LE swelling, malaise/fatigue  Hospital Course: [name] is a 65 yo female with PMH HTN, prior Covid (Dec 2021) who presented to the hospital with complaints of lower extremity swelling, back pain, generalized malaise/fatigue at home. She underwent extensive work-up and is followed by nephrology, ID.  In the ED, she was found to have a hemoglobin of 6.3, potassium 5.4, sodium 130, bicarb 9, BUN 144, and creatinine 29.98.  CT of the abdomen and pelvis was negative for obstruction or acute abnormality.  Temporary hemodialysis catheter was placed by IR and subsequently the patient had a PEA arrest associated with seizure-like activity.  She was intubated and subsequently extubated 02/28/2020.    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 Renal biopsy done 03/03/2019.  ANA positive.  ID is following for fevers and infectious etiology possibly underlying.   Trialysis cath removed on 03/06/20.   See below for problem based plans.    Interval History:  No events overnight. Complaining of worsening swelling in her legs today. Received blood transfusion earlier this morning for hemoglobin of 6.6 g/dL. Otherwise feeling okay, denies chest pain, shortness of breath.  ROS: Constitutional: positive for fatigue and malaise, Respiratory: negative for wheezing, Cardiovascular: negative for chest pain and Gastrointestinal: negative for abdominal pain  Assessment & Plan: * AKI (acute kidney injury) ([place]) - Patient presented with elevation of BUN (144) and creat (29.98) -No prior known renal dysfunction and creatinine normal in 2018 - Renal US showed echogenic kidneys. CT renal stone study showed non-obstructive right nephrolithiasis. 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 ANA positive, no M spike detected on protein  electrophoresis, ASO negative, C3 117, C4 35, ANCA negative,GBM antibody negative.RPR non-reactive.  dsDNA negative, SCL 70 Positive+ (1.9), SSA IgG > 8+, SSB normal,  Chromatin antibody positive.   - Status post renal biopsy 03/02/2020.  Developed a perinephric hematoma post biopsy.  Continue hemodialysis per nephrology.  - s/p removal of HD-cath on 03/06/20; tentative plan for permcath on 3/7  Fever - afebrile now > 24 hours  - Differential broad: PNA vs atelectasis vs autoimmune mediated vs infected HD port (now removed 03/06/20) vs other - ID now on board; extensive workup underway - s/p HD cath removed on 3/4 - repeat blood cultures if recurrent fever - no abx for now per ID due to u/k source if any  -CT chest performed on 03/06/2020: Moderate bilateral pleural effusions, compressive atelectasis left greater than right, reactive mediastinal lymphadenopathy - 3/5: UE/LE duplex negative for DVTs -Incentive spirometer ordered  Pleural effusion - moderate sized on CT on 3/4; no obvious HF on echo (EF 50-55%). Etiology possibly due to third spacing (albumin 1.4)  - given poor UOP likely to not respond well to lasix/albumin challenge  - may benefit from change in diet; will consult RD - continue on HD for now and will monitor change in effusion with periodic CXR or if patient starts becoming SOB/hypoxic (currently on RA) - follow up lasix response started 3/6 per nephrology; may benefit from adding albumin as well (will discuss with nephrology)  Glomerulonephritis - see AKI - awaiting renal biopsy results   Lobar pneumonia ([place]) -See fever work-up as well.  Extubated on 02/28/2020 -Has been treated with antibiotics -Procalcitonin remains elevated although may be some confounding from underlying ESRD -No sputum  able to be collected.  Blood cultures remain negative.  No growth on urine culture either -CT chest ordered per ID on 03/06/2020: shows B/L effusions, atelectasis but no obvious  infiltrates  Normocytic anemia - Developed acute onset anemia s/p kidney biopsy from perinephric hematoma.  Received 2 units of PRBCs 03/03/2020.  - Transfuse for hemoglobin less than 7.   - Continue Aranesp per nephrology. - CBC daily  - Hgb 6.6 g/dL this am (3/6) - receiving 1 unit PRBC today   Seizure ([place]) - Patient had seizure with lossof pulse and PEA arrest s/p CPR. - Patient was subsequently evaluated by neurology who restarted patient on Keppra.  - Plan is to discharge patient on Keppra 500 mg twice daily.  ABLA (acute blood loss anemia) - see normocytic anemia   Perinephric hematoma -Seen on CT abdomen/pelvis on 03/03/2020.  No impingement noted on kidney  HTN (hypertension) - Continue with nifedipine and labetalol as needed.  Intermittently hypertensive.  Uremia - see AKI  Asymptomatic bacteriuria-resolved as of 03/06/2020 -Not reporting symptoms -UA reviewed from 02/29/2020: Nitrite negative, moderate leukocyte esterase, no bacteria, WBC clumps, 11-20 RBCs - treated with CTX 2/26 - 3/2 and cefepime x 1 on 3/3  Acute on chronic respiratory failure with hypoxia (HCC)-resolved as of 03/06/2020 - s/p PEA arrest - extubated on 02/28/20; now on RA  Cardiac arrest with pulseless electrical activity (HCC)-resolved as of 03/06/2020 - s/p CPR and intubation   Ventilator dependence (HCC)-resolved as of 03/06/2020 - extubated 02/28/20   Old records reviewed in assessment of this patient  Antimicrobials: Rocephin 02/29/2020 -02/05/2020 Cefepime 03/05/2020 x 1 Vancomycin 03/05/2020 x 1  DVT prophylaxis: SCDs Start: 02/25/20 2009   Code Status:   Code Status: Full Code Family Communication: none present   Disposition Plan: Status is: Inpatient  Remains inpatient appropriate because:Ongoing diagnostic testing needed not appropriate for outpatient work up, IV treatments appropriate due to intensity of illness or inability to take PO and Inpatient level of care appropriate due  to severity of illness   Dispo:  Patient From: Home  Planned Disposition: Home with Health Care Svc  Medically stable for discharge: No    Risk of unplanned readmission score: Unplanned Admission- Pilot do not use: 20.29   Objective: Blood pressure 135/83, pulse 91, temperature 98.6 F (37 C), temperature source Oral, resp. rate (!) 27, height 5' 2.99" (1.6 m), weight 75.1 kg, SpO2 97 %.  Examination: General appearance: alert, cooperative, no distress and weak and deconditioned in bed Head: Normocephalic, without obvious abnormality, atraumatic  Neck: Right neck noted with bandage in place from prior cath removed Eyes: EOMI Lungs: coarse sounds bilaterally Heart: Tachycardic, regular rhythm, S1-S2 present.  No appreciated murmurs Abdomen: Obese, soft, nontender, nondistended, bowel sounds present Extremities: 1-2+ LE edema B/L Skin: mobility and turgor normal Neurologic: Grossly normal  Consultants:   ID  Nephrology  Procedures:     Data Reviewed: I have personally reviewed following labs and imaging studies Results for orders placed or performed during the hospital encounter of 02/25/20 (from the past 24 hour(s))  Hemoglobin and hematocrit, blood     Status: Abnormal   Collection Time: 03/07/20  3:22 PM  Result Value Ref Range   Hemoglobin 7.8 (L) 12.0 - 15.0 g/dL   HCT 24.1 (L) 36.0 - 46.0 %  Glucose, capillary     Status: None   Collection Time: 03/07/20  6:28 PM  Result Value Ref Range   Glucose-Capillary 96 70 - 99 mg/dL  Glucose,  capillary     Status: Abnormal   Collection Time: 03/07/20 10:34 PM  Result Value Ref Range   Glucose-Capillary 105 (H) 70 - 99 mg/dL  Basic metabolic panel     Status: Abnormal   Collection Time: 03/08/20 12:46 AM  Result Value Ref Range   Sodium 133 (L) 135 - 145 mmol/L   Potassium 3.6 3.5 - 5.1 mmol/L   Chloride 98 98 - 111 mmol/L   CO2 24 22 - 32 mmol/L   Glucose, Bld 89 70 - 99 mg/dL   BUN 35 (H) 8 - 23 mg/dL    Creatinine, Ser 6.48 (H) 0.44 - 1.00 mg/dL   Calcium 7.1 (L) 8.9 - 10.3 mg/dL   GFR, Estimated 7 (L) >60 mL/min   Anion gap 11 5 - 15  CBC with Differential/Platelet     Status: Abnormal   Collection Time: 03/08/20 12:46 AM  Result Value Ref Range   WBC 11.8 (H) 4.0 - 10.5 K/uL   RBC 2.26 (L) 3.87 - 5.11 MIL/uL   Hemoglobin 6.6 (LL) 12.0 - 15.0 g/dL   HCT 20.0 (L) 36.0 - 46.0 %   MCV 88.5 80.0 - 100.0 fL   MCH 29.2 26.0 - 34.0 pg   MCHC 33.0 30.0 - 36.0 g/dL   RDW 14.9 11.5 - 15.5 %   Platelets 278 150 - 400 K/uL   nRBC 0.0 0.0 - 0.2 %   Neutrophils Relative % 76 %   Neutro Abs 9.0 (H) 1.7 - 7.7 K/uL   Lymphocytes Relative 9 %   Lymphs Abs 1.0 0.7 - 4.0 K/uL   Monocytes Relative 13 %   Monocytes Absolute 1.5 (H) 0.1 - 1.0 K/uL   Eosinophils Relative 1 %   Eosinophils Absolute 0.1 0.0 - 0.5 K/uL   Basophils Relative 0 %   Basophils Absolute 0.0 0.0 - 0.1 K/uL   Immature Granulocytes 1 %   Abs Immature Granulocytes 0.14 (H) 0.00 - 0.07 K/uL  Magnesium     Status: None   Collection Time: 03/08/20 12:46 AM  Result Value Ref Range   Magnesium 1.9 1.7 - 2.4 mg/dL  Phosphorus     Status: None   Collection Time: 03/08/20 12:46 AM  Result Value Ref Range   Phosphorus 4.3 2.5 - 4.6 mg/dL  Prepare RBC (crossmatch)     Status: None   Collection Time: 03/08/20  2:50 AM  Result Value Ref Range   Order Confirmation      ORDER PROCESSED BY BLOOD BANK Performed at [facility], [address], [place], [place] [address]   Type and screen [place]     Status: None (Preliminary result)   Collection Time: 03/08/20  4:07 AM  Result Value Ref Range   ABO/RH(D) O POS    Antibody Screen NEG    Sample Expiration 03/11/2020,2359    Unit Number [id number]    Blood Component Type RED CELLS,LR    Unit division 00    Status of Unit ISSUED    Transfusion Status OK TO TRANSFUSE    Crossmatch Result      Compatible Performed at [facility], [place] [address], [place], [place] [address]   Glucose, capillary     Status: None   Collection Time: 03/08/20  8:12 AM  Result Value Ref Range   Glucose-Capillary 83 70 - 99 mg/dL    Recent Results (from the past 240 hour(s))  Urine Culture     Status:  None   Collection Time: 03/04/20  5:30 AM   Specimen: Urine, Random  Result Value Ref Range Status   Specimen Description URINE, RANDOM  Final   Special Requests STERILE CUP  Final   Culture   Final    NO GROWTH Performed at [facility], [address], [place], [place] [address]    Report Status 03/05/2020 FINAL  Final  Culture, blood (routine x 2)     Status: None (Preliminary result)   Collection Time: 03/04/20  8:48 PM   Specimen: BLOOD  Result Value Ref Range Status   Specimen Description BLOOD LEFT ANTECUBITAL  Final   Special Requests   Final    BOTTLES DRAWN AEROBIC AND ANAEROBIC Blood Culture results may not be optimal due to an inadequate volume of blood received in culture bottles   Culture   Final    NO GROWTH 4 DAYS Performed at [facility], [place] [address] [place], [place] [address]    Report Status PENDING  Incomplete  Culture, blood (routine x 2)     Status: None (Preliminary result)   Collection Time: 03/04/20  8:48 PM   Specimen: BLOOD LEFT HAND  Result Value Ref Range Status   Specimen Description BLOOD LEFT HAND  Final   Special Requests   Final    BOTTLES DRAWN AEROBIC AND ANAEROBIC Blood Culture results may not be optimal due to an inadequate volume of blood received in culture bottles   Culture   Final    NO GROWTH 4 DAYS Performed at [facility], [place] [address], [place], [place] [address]    Report Status PENDING  Incomplete     Radiology Studies: CT CHEST WO CONTRAST  Result Date: 03/06/2020 CLINICAL DATA:  Fever of unknown origin. Recent COVID infection. Progressive fatigue and back pain. Recent renal biopsy. EXAM: CT CHEST WITHOUT CONTRAST TECHNIQUE: Multidetector CT imaging of the chest was  performed following the standard protocol without IV contrast. COMPARISON:  Lung bases from abdominal CT 03/03/2020. Chest radiograph 03/01/2020 FINDINGS: Cardiovascular: Moderate aortic atherosclerosis. Mild cardiomegaly. Trace pericardial fluid. Decreased density of cardiac blood pool consistent with provided history of anemia. Mediastinum/Nodes: Shotty mediastinal lymph nodes with prominent nodes measuring up to 9 mm short axis. Limited assessment for hilar adenopathy on this noncontrast exam. Small bilateral axillary lymph nodes not enlarged by size criteria. No thyroid nodule. Decompressed esophagus. Lungs/Pleura: Moderate bilateral pleural effusions. Pleural effusion on the right is not significantly changed, pleural effusion on the left has increased in size. Associated compressive atelectasis in the left greater than right lower lobe. No other focal airspace disease. No findings of pulmonary edema. Trachea and central bronchi are patent. Upper Abdomen: Left perinephric hematoma, mild improvement. Cholecystectomy. Musculoskeletal: Generalized subcutaneous body wall edema. Minimal thoracic spondylosis with endplate spurring. There are no acute or suspicious osseous abnormalities. IMPRESSION: 1. Moderate bilateral pleural effusions with compressive atelectasis in the left greater than right lower lobe. Pleural effusion on the right is not significantly changed from recent abdominal CT, pleural effusion on the left has increased in size. 2. Shotty mediastinal lymph nodes are likely reactive. 3. Generalized subcutaneous body wall edema. 4. Mild cardiomegaly. 5. Improving left perinephric hematoma. Aortic Atherosclerosis (ICD10-I70.0). Electronically Signed   By: Keith Rake M.D.   On: 03/06/2020 17:26   ECHOCARDIOGRAM COMPLETE  Result Date: 03/06/2020    ECHOCARDIOGRAM REPORT   Patient Name:   HALYNN REITANO Date of Exam: 03/06/2020 Medical Rec #:  [id number]             Height:       63.0 in  Accession #:    [id number]            Weight:       167.5 lb Date of Birth:  01-21-1955            BSA:          1.793 m Patient Age:    64 years              BP:           151/95 mmHg Patient Gender: F                     HR:           121 bpm. Exam Location:  Inpatient Procedure: 2D Echo, Cardiac Doppler and Color Doppler Indications:    Fever  History:        Patient has no prior history of Echocardiogram examinations.                 Risk Factors:Hypertension. H/O COVID-19 infection.  Sonographer:    Ross Ludwig RDCS (AE) Referring Phys: [id number] TRUNG T VU IMPRESSIONS  1. Left ventricular ejection fraction, by estimation, is 50 to 55%. The left ventricle has low normal function. The left ventricle has no regional wall motion abnormalities. There is moderate left ventricular hypertrophy. Indeterminate diastolic filling  due to E-A fusion. There is incoordinate septal motion.  2. Right ventricular systolic function is normal. The right ventricular size is normal. There is normal pulmonary artery systolic pressure. The estimated right ventricular systolic pressure is 30.5 mmHg.  3. Left atrial size was moderately dilated.  4. The pericardial effusion is posterior to the left ventricle.  5. The mitral valve is grossly normal. Trivial mitral valve regurgitation.  6. The aortic valve is tricuspid. Aortic valve regurgitation is not visualized. Mild aortic valve sclerosis is present, with no evidence of aortic valve stenosis.  7. The inferior vena cava is normal in size with greater than 50% respiratory variability, suggesting right atrial pressure of 3 mmHg. Comparison(s): No prior Echocardiogram. Conclusion(s)/Recommendation(s): No evidence of valvular vegetations on this transthoracic echocardiogram. Would recommend a transesophageal echocardiogram to exclude infective endocarditis if clinically indicated. FINDINGS  Left Ventricle: Left ventricular ejection fraction, by estimation, is 50 to 55%. The left ventricle  has low normal function. The left ventricle has no regional wall motion abnormalities. The left ventricular internal cavity size was normal in size. There is moderate left ventricular hypertrophy. Incoordinate septal motion. Indeterminate diastolic filling due to E-A fusion. Right Ventricle: The right ventricular size is normal. No increase in right ventricular wall thickness. Right ventricular systolic function is normal. There is normal pulmonary artery systolic pressure. The tricuspid regurgitant velocity is 2.62 m/s, and  with an assumed right atrial pressure of 3 mmHg, the estimated right ventricular systolic pressure is 30.5 mmHg. Left Atrium: Left atrial size was moderately dilated. Right Atrium: Right atrial size was normal in size. Pericardium: Trivial pericardial effusion is present. The pericardial effusion is posterior to the left ventricle. Mitral Valve: The mitral valve is grossly normal. Trivial mitral valve regurgitation. Tricuspid Valve: The tricuspid valve is grossly normal. Tricuspid valve regurgitation is mild. Aortic Valve: The aortic valve is tricuspid. Aortic valve regurgitation is not visualized. Mild aortic valve sclerosis is present, with no evidence of aortic valve stenosis. Aortic valve mean gradient measures  9.0 mmHg. Aortic valve peak gradient measures 16.5 mmHg. Aortic valve area, by VTI measures 2.12 cm. Pulmonic Valve: The pulmonic valve was normal in structure. Pulmonic valve regurgitation is not visualized. Aorta: The aortic root and ascending aorta are structurally normal, with no evidence of dilitation. Venous: The inferior vena cava is normal in size with greater than 50% respiratory variability, suggesting right atrial pressure of 3 mmHg. IAS/Shunts: No atrial level shunt detected by color flow Doppler. Additional Comments: There is a small pleural effusion in the left lateral region.  LEFT VENTRICLE PLAX 2D LVIDd:         4.10 cm LVIDs:         3.00 cm LV PW:         1.50 cm  LV IVS:        1.40 cm LVOT diam:     2.10 cm LV SV:         66 LV SV Index:   37 LVOT Area:     3.46 cm  RIGHT VENTRICLE          IVC RV Basal diam:  3.40 cm  IVC diam: 1.60 cm LEFT ATRIUM             Index       RIGHT ATRIUM           Index LA diam:        2.60 cm 1.45 cm/m  RA Area:     17.80 cm LA Vol (A2C):   66.7 ml 37.19 ml/m RA Volume:   49.50 ml  27.60 ml/m LA Vol (A4C):   86.4 ml 48.17 ml/m LA Biplane Vol: 76.4 ml 42.60 ml/m  AORTIC VALVE AV Area (Vmax):    2.18 cm AV Area (Vmean):   2.03 cm AV Area (VTI):     2.12 cm AV Vmax:           203.00 cm/s AV Vmean:          145.000 cm/s AV VTI:            0.310 m AV Peak Grad:      16.5 mmHg AV Mean Grad:      9.0 mmHg LVOT Vmax:         128.00 cm/s LVOT Vmean:        84.900 cm/s LVOT VTI:          0.190 m LVOT/AV VTI ratio: 0.61  AORTA Ao Root diam: 3.00 cm Ao Asc diam:  3.20 cm TRICUSPID VALVE TR Peak grad:   27.5 mmHg TR Vmax:        262.00 cm/s  SHUNTS Systemic VTI:  0.19 m Systemic Diam: 2.10 cm Lyman Bishop MD Electronically signed by Lyman Bishop MD Signature Date/Time: 03/06/2020/3:22:26 PM    Final    VAS [place] LOWER EXTREMITY VENOUS (DVT)  Result Date: 03/07/2020  Lower Venous DVT Study Indications: Fever of unknown origin.  Comparison Study: No prior study Performing Technologist: Sharion Dove RVS  Examination Guidelines: A complete evaluation includes B-mode imaging, spectral Doppler, color Doppler, and power Doppler as needed of all accessible portions of each vessel. Bilateral testing is considered an integral part of a complete examination. Limited examinations for reoccurring indications may be performed as noted. The reflux portion of the exam is performed with the patient in reverse Trendelenburg.  +---------+---------------+---------+-----------+----------+--------------+ RIGHT    CompressibilityPhasicitySpontaneityPropertiesThrombus Aging +---------+---------------+---------+-----------+----------+--------------+ CFV       Full  Yes      Yes                                 +---------+---------------+---------+-----------+----------+--------------+ SFJ      Full                                                        +---------+---------------+---------+-----------+----------+--------------+ FV Prox  Full                                                        +---------+---------------+---------+-----------+----------+--------------+ FV Mid   Full                                                        +---------+---------------+---------+-----------+----------+--------------+ FV DistalFull                                                        +---------+---------------+---------+-----------+----------+--------------+ PFV      Full                                                        +---------+---------------+---------+-----------+----------+--------------+ POP      Full           Yes      Yes                                 +---------+---------------+---------+-----------+----------+--------------+ PTV      Full                                                        +---------+---------------+---------+-----------+----------+--------------+ PERO     Full                                                        +---------+---------------+---------+-----------+----------+--------------+   +---------+---------------+---------+-----------+----------+--------------+ LEFT     CompressibilityPhasicitySpontaneityPropertiesThrombus Aging +---------+---------------+---------+-----------+----------+--------------+ CFV      Full           Yes      Yes                                 +---------+---------------+---------+-----------+----------+--------------+ SFJ  Full                                                        +---------+---------------+---------+-----------+----------+--------------+ FV Prox  Full                                                         +---------+---------------+---------+-----------+----------+--------------+ FV Mid   Full                                                        +---------+---------------+---------+-----------+----------+--------------+ FV DistalFull                                                        +---------+---------------+---------+-----------+----------+--------------+ PFV      Full                                                        +---------+---------------+---------+-----------+----------+--------------+ POP      Full           Yes      Yes                                 +---------+---------------+---------+-----------+----------+--------------+ PTV      Full                                                        +---------+---------------+---------+-----------+----------+--------------+ PERO     Full                                                        +---------+---------------+---------+-----------+----------+--------------+     Summary: BILATERAL: - No evidence of deep vein thrombosis seen in the lower extremities, bilaterally. -No evidence of popliteal cyst, bilaterally.   *See table(s) above for measurements and observations. Electronically signed by Jamelle Haring on 03/07/2020 at 5:50:28 PM.    Final    VAS [place] UPPER EXTREMITY VENOUS DUPLEX  Result Date: 03/07/2020 UPPER VENOUS STUDY  Indications: fever of unknown origin Limitations: Bandage at right neck. Comparison Study: No prior study Performing Technologist: Sharion Dove RVS  Examination Guidelines: A complete evaluation includes B-mode imaging, spectral Doppler, color Doppler, and power Doppler as needed of all accessible portions of each vessel. Bilateral testing is considered an integral part of  a complete examination. Limited examinations for reoccurring indications may be performed as noted.  Right Findings: +----------+------------+---------+-----------+----------+-------+ RIGHT      CompressiblePhasicitySpontaneousPropertiesSummary +----------+------------+---------+-----------+----------+-------+ IJV                      Yes       Yes                      +----------+------------+---------+-----------+----------+-------+ Subclavian               Yes       Yes                      +----------+------------+---------+-----------+----------+-------+ Axillary                 Yes       Yes                      +----------+------------+---------+-----------+----------+-------+ Brachial      Full       Yes       Yes                      +----------+------------+---------+-----------+----------+-------+ Radial        Full                                          +----------+------------+---------+-----------+----------+-------+ Ulnar         Full                                          +----------+------------+---------+-----------+----------+-------+ Cephalic      Full                                          +----------+------------+---------+-----------+----------+-------+ Basilic       Full                                          +----------+------------+---------+-----------+----------+-------+  Left Findings: +----------+------------+---------+-----------+----------+-------+ LEFT      CompressiblePhasicitySpontaneousPropertiesSummary +----------+------------+---------+-----------+----------+-------+ IJV           Full       Yes       Yes                      +----------+------------+---------+-----------+----------+-------+ Subclavian               Yes       Yes                      +----------+------------+---------+-----------+----------+-------+ Axillary                 Yes       Yes                      +----------+------------+---------+-----------+----------+-------+ Brachial      Full       Yes       Yes                      +----------+------------+---------+-----------+----------+-------+  Radial        Full                                          +----------+------------+---------+-----------+----------+-------+ Ulnar         Full                                          +----------+------------+---------+-----------+----------+-------+ Cephalic      Full                                          +----------+------------+---------+-----------+----------+-------+  Summary:  Right: No evidence of deep vein thrombosis in the upper extremity. No evidence of superficial vein thrombosis in the upper extremity.  Left: No evidence of deep vein thrombosis in the upper extremity. No evidence of superficial vein thrombosis in the upper extremity.  *See table(s) above for measurements and observations.  Diagnosing physician: Jamelle Haring Electronically signed by Jamelle Haring on 03/07/2020 at 5:50:10 PM.    Final    VAS [place] LOWER EXTREMITY VENOUS (DVT)  Final Result    VAS [place] UPPER EXTREMITY VENOUS DUPLEX  Final Result    CT CHEST WO CONTRAST  Final Result    CT ABDOMEN PELVIS WO CONTRAST  Final Result    US BIOPSY (KIDNEY)  Final Result    DG Chest 2 View  Final Result    DG CHEST PORT 1 VIEW  Final Result    MR BRAIN WO CONTRAST  Final Result    MR ANGIO HEAD WO CONTRAST  Final Result    CT HEAD WO CONTRAST  Final Result    DG Abd 1 View  Final Result    DG CHEST PORT 1 VIEW  Final Result    DG Chest Port 1 View  Final Result    IR Fluoro Guide CV Line Right  Final Result    IR US Guide Vasc Access Right  Final Result    US RENAL  Final Result    CT Renal Stone Study  Final Result    IR Fluoro Guide CV Line Right    (Results Pending)    Scheduled Meds: . sodium chloride   Intravenous Once  . chlorhexidine gluconate (MEDLINE KIT)  15 mL Mouth Rinse BID  . Chlorhexidine Gluconate Cloth  6 each Topical Q0600  . Chlorhexidine Gluconate Cloth  6 each Topical Q0600  . dextromethorphan  30 mg Oral BID  . docusate sodium  100 mg Oral BID   . feeding supplement  237 mL Oral TID BM  . levETIRAcetam  250 mg Oral Q T,Th,Sa-HD  . levETIRAcetam  500 mg Oral BID  . mouth rinse  15 mL Mouth Rinse BID  . metoprolol tartrate  25 mg Oral BID  . multivitamin  1 tablet Oral QHS  . NIFEdipine  90 mg Oral Daily  . sodium chloride flush  10-40 mL Intracatheter Q12H   PRN Meds: sodium chloride, acetaminophen, antiseptic oral rinse, guaiFENesin, labetalol, LORazepam, melatonin, ondansetron **OR** ondansetron (ZOFRAN) IV, oxyCODONE, sodium chloride flush Continuous Infusions: . sodium chloride Stopped (02/28/20 1701)  . [START ON 03/09/2020] vancomycin       LOS: 12 days  Time spent: Greater than 50% of the 35 minute visit was spent in counseling/coordination of care for the patient as laid out in the A&P.   Dwyane Dee, MD [facility] 03/08/2020, 12:18 PM

## 2020-03-08 NOTE — Progress Notes (Signed)
Date and time results received: 03/08/20 0243  Test: Hemoglobin  Critical Value: 6.6  Name of Provider Notified: [place]  Orders Received? Or Actions Taken?:

## 2020-03-08 NOTE — Progress Notes (Signed)
Patient ID: Barbara Crawford, female   DOB: 15-May-1955, 65 y.o.   MRN: [id number] S: Feels well but Hgb dropped to 6.6 overnight and is receiving a blood transfusion. O:BP 130/89 (BP Location: Left Arm)   Pulse (!) 103   Temp 99.5 F (37.5 C) (Oral)   Resp (!) 24   Ht 5' 2.99" (1.6 m)   Wt 75.1 kg   SpO2 95%   BMI 29.34 kg/m  No intake or output data in the 24 hours ending 03/08/20 0930 Intake/Output: I/O last 3 completed shifts: In: 240 [P.O.:240] Out: 100 [Urine:100]  Intake/Output this shift:  No intake/output data recorded. Weight change:  Gen: NAD CVS: tachy at 103 Resp: cta Abd: +BS, soft, NT/ND Ext: 1+ edema BLE  Recent Labs  Lab 03/02/20 0324 03/03/20 0543 03/04/20 0044 03/05/20 0807 03/06/20 0237 03/07/20 0110 03/08/20 0046  NA 135 135 135 132* 132* 132* 133*  K 4.3 4.0 3.9 4.3 4.7 3.5 3.6  CL 99 98 99 98 96* 98 98  CO2 $Re'25 24 23 22 'fMT$ 20* 25 24  GLUCOSE 91 87 125* 71 62* 85 89  BUN 33* 16 24* 22 35* 18 35*  CREATININE 6.37* 3.90* 5.35* 5.19* 6.36* 4.47* 6.48*  ALBUMIN 1.4* 1.3* 1.4* 1.3* 1.4*  --   --   CALCIUM 7.7* 8.3* 7.7* 7.2* 7.4* 7.1* 7.1*  PHOS  --  3.0 3.7  --  4.9* 3.6 4.3  AST 70*  --   --  44*  --   --   --   ALT 53*  --   --  35  --   --   --    Liver Function Tests: Recent Labs  Lab 03/02/20 0324 03/03/20 0543 03/04/20 0044 03/05/20 0807 03/06/20 0237  AST 70*  --   --  44*  --   ALT 53*  --   --  35  --   ALKPHOS 57  --   --  58  --   BILITOT 0.5  --   --  0.6  --   PROT 5.7*  --   --  5.5*  --   ALBUMIN 1.4*   < > 1.4* 1.3* 1.4*   < > = values in this interval not displayed.   No results for input(s): LIPASE, AMYLASE in the last 168 hours. No results for input(s): AMMONIA in the last 168 hours. CBC: Recent Labs  Lab 03/04/20 0044 03/04/20 0740 03/05/20 0807 03/06/20 0237 03/07/20 0110 03/07/20 1522 03/08/20 0046  WBC 8.8  --  18.3* 18.0* 12.1*  --  11.8*  NEUTROABS  --   --   --   --  9.4*  --  9.0*  HGB 7.9*   < >  7.8* 7.8* 7.2* 7.8* 6.6*  HCT 24.4*   < > 22.9* 24.6* 21.2* 24.1* 20.0*  MCV 88.7  --  87.4 89.5 87.2  --  88.5  PLT 223  --  222 265 247  --  278   < > = values in this interval not displayed.   Cardiac Enzymes: No results for input(s): CKTOTAL, CKMB, CKMBINDEX, TROPONINI in the last 168 hours. CBG: Recent Labs  Lab 03/06/20 2111 03/07/20 0808 03/07/20 1828 03/07/20 2234 03/08/20 0812  GLUCAP 103* 81 96 105* 83    Iron Studies: No results for input(s): IRON, TIBC, TRANSFERRIN, FERRITIN in the last 72 hours. Studies/Results: CT CHEST WO CONTRAST  Result Date: 03/06/2020 CLINICAL DATA:  Fever of unknown origin. Recent  COVID infection. Progressive fatigue and back pain. Recent renal biopsy. EXAM: CT CHEST WITHOUT CONTRAST TECHNIQUE: Multidetector CT imaging of the chest was performed following the standard protocol without IV contrast. COMPARISON:  Lung bases from abdominal CT 03/03/2020. Chest radiograph 03/01/2020 FINDINGS: Cardiovascular: Moderate aortic atherosclerosis. Mild cardiomegaly. Trace pericardial fluid. Decreased density of cardiac blood pool consistent with provided history of anemia. Mediastinum/Nodes: Shotty mediastinal lymph nodes with prominent nodes measuring up to 9 mm short axis. Limited assessment for hilar adenopathy on this noncontrast exam. Small bilateral axillary lymph nodes not enlarged by size criteria. No thyroid nodule. Decompressed esophagus. Lungs/Pleura: Moderate bilateral pleural effusions. Pleural effusion on the right is not significantly changed, pleural effusion on the left has increased in size. Associated compressive atelectasis in the left greater than right lower lobe. No other focal airspace disease. No findings of pulmonary edema. Trachea and central bronchi are patent. Upper Abdomen: Left perinephric hematoma, mild improvement. Cholecystectomy. Musculoskeletal: Generalized subcutaneous body wall edema. Minimal thoracic spondylosis with endplate  spurring. There are no acute or suspicious osseous abnormalities. IMPRESSION: 1. Moderate bilateral pleural effusions with compressive atelectasis in the left greater than right lower lobe. Pleural effusion on the right is not significantly changed from recent abdominal CT, pleural effusion on the left has increased in size. 2. Shotty mediastinal lymph nodes are likely reactive. 3. Generalized subcutaneous body wall edema. 4. Mild cardiomegaly. 5. Improving left perinephric hematoma. Aortic Atherosclerosis (ICD10-I70.0). Electronically Signed   By: Keith Rake M.D.   On: 03/06/2020 17:26   ECHOCARDIOGRAM COMPLETE  Result Date: 03/06/2020    ECHOCARDIOGRAM REPORT   Patient Name:   Barbara Crawford Date of Exam: 03/06/2020 Medical Rec #:  [id number]             Height:       63.0 in Accession #:    [id number]            Weight:       167.5 lb Date of Birth:  [birth year]            BSA:          1.793 m Patient Age:    70 years              BP:           151/95 mmHg Patient Gender: F                     HR:           121 bpm. Exam Location:  Inpatient Procedure: 2D Echo, Cardiac Doppler and Color Doppler Indications:    Fever  History:        Patient has no prior history of Echocardiogram examinations.                 Risk Factors:Hypertension. H/O COVID-19 infection.  Sonographer:    Clayton Lefort RDCS (AE) Referring Phys: [id number] [place]  1. Left ventricular ejection fraction, by estimation, is 50 to 55%. The left ventricle has low normal function. The left ventricle has no regional wall motion abnormalities. There is moderate left ventricular hypertrophy. Indeterminate diastolic filling  due to E-A fusion. There is incoordinate septal motion.  2. Right ventricular systolic function is normal. The right ventricular size is normal. There is normal pulmonary artery systolic pressure. The estimated right ventricular systolic pressure is 54.6 mmHg.  3. Left atrial size was moderately dilated.  4.  The pericardial effusion is posterior  to the left ventricle.  5. The mitral valve is grossly normal. Trivial mitral valve regurgitation.  6. The aortic valve is tricuspid. Aortic valve regurgitation is not visualized. Mild aortic valve sclerosis is present, with no evidence of aortic valve stenosis.  7. The inferior vena cava is normal in size with greater than 50% respiratory variability, suggesting right atrial pressure of 3 mmHg. Comparison(s): No prior Echocardiogram. Conclusion(s)/Recommendation(s): No evidence of valvular vegetations on this transthoracic echocardiogram. Would recommend a transesophageal echocardiogram to exclude infective endocarditis if clinically indicated. FINDINGS  Left Ventricle: Left ventricular ejection fraction, by estimation, is 50 to 55%. The left ventricle has low normal function. The left ventricle has no regional wall motion abnormalities. The left ventricular internal cavity size was normal in size. There is moderate left ventricular hypertrophy. Incoordinate septal motion. Indeterminate diastolic filling due to E-A fusion. Right Ventricle: The right ventricular size is normal. No increase in right ventricular wall thickness. Right ventricular systolic function is normal. There is normal pulmonary artery systolic pressure. The tricuspid regurgitant velocity is 2.62 m/s, and  with an assumed right atrial pressure of 3 mmHg, the estimated right ventricular systolic pressure is 96.0 mmHg. Left Atrium: Left atrial size was moderately dilated. Right Atrium: Right atrial size was normal in size. Pericardium: Trivial pericardial effusion is present. The pericardial effusion is posterior to the left ventricle. Mitral Valve: The mitral valve is grossly normal. Trivial mitral valve regurgitation. Tricuspid Valve: The tricuspid valve is grossly normal. Tricuspid valve regurgitation is mild. Aortic Valve: The aortic valve is tricuspid. Aortic valve regurgitation is not visualized. Mild  aortic valve sclerosis is present, with no evidence of aortic valve stenosis. Aortic valve mean gradient measures 9.0 mmHg. Aortic valve peak gradient measures 16.5 mmHg. Aortic valve area, by VTI measures 2.12 cm. Pulmonic Valve: The pulmonic valve was normal in structure. Pulmonic valve regurgitation is not visualized. Aorta: The aortic root and ascending aorta are structurally normal, with no evidence of dilitation. Venous: The inferior vena cava is normal in size with greater than 50% respiratory variability, suggesting right atrial pressure of 3 mmHg. IAS/Shunts: No atrial level shunt detected by color flow Doppler. Additional Comments: There is a small pleural effusion in the left lateral region.  LEFT VENTRICLE PLAX 2D LVIDd:         4.10 cm LVIDs:         3.00 cm LV PW:         1.50 cm LV IVS:        1.40 cm LVOT diam:     2.10 cm LV SV:         66 LV SV Index:   37 LVOT Area:     3.46 cm  RIGHT VENTRICLE          IVC RV Basal diam:  3.40 cm  IVC diam: 1.60 cm LEFT ATRIUM             Index       RIGHT ATRIUM           Index LA diam:        2.60 cm 1.45 cm/m  RA Area:     17.80 cm LA Vol (A2C):   66.7 ml 37.19 ml/m RA Volume:   49.50 ml  27.60 ml/m LA Vol (A4C):   86.4 ml 48.17 ml/m LA Biplane Vol: 76.4 ml 42.60 ml/m  AORTIC VALVE AV Area (Vmax):    2.18 cm AV Area (Vmean):   2.03 cm AV Area (  VTI):     2.12 cm AV Vmax:           203.00 cm/s AV Vmean:          145.000 cm/s AV VTI:            0.310 m AV Peak Grad:      16.5 mmHg AV Mean Grad:      9.0 mmHg LVOT Vmax:         128.00 cm/s LVOT Vmean:        84.900 cm/s LVOT VTI:          0.190 m LVOT/AV VTI ratio: 0.61  AORTA Ao Root diam: 3.00 cm Ao Asc diam:  3.20 cm TRICUSPID VALVE TR Peak grad:   27.5 mmHg TR Vmax:        262.00 cm/s  SHUNTS Systemic VTI:  0.19 m Systemic Diam: 2.10 cm Lyman Bishop MD Electronically signed by Lyman Bishop MD Signature Date/Time: 03/06/2020/3:22:26 PM    Final    VAS [place] LOWER EXTREMITY VENOUS (DVT)  Result Date:  03/07/2020  Lower Venous DVT Study Indications: Fever of unknown origin.  Comparison Study: No prior study Performing Technologist: Sharion Dove RVS  Examination Guidelines: A complete evaluation includes B-mode imaging, spectral Doppler, color Doppler, and power Doppler as needed of all accessible portions of each vessel. Bilateral testing is considered an integral part of a complete examination. Limited examinations for reoccurring indications may be performed as noted. The reflux portion of the exam is performed with the patient in reverse Trendelenburg.  +---------+---------------+---------+-----------+----------+--------------+ RIGHT    CompressibilityPhasicitySpontaneityPropertiesThrombus Aging +---------+---------------+---------+-----------+----------+--------------+ CFV      Full           Yes      Yes                                 +---------+---------------+---------+-----------+----------+--------------+ SFJ      Full                                                        +---------+---------------+---------+-----------+----------+--------------+ FV Prox  Full                                                        +---------+---------------+---------+-----------+----------+--------------+ FV Mid   Full                                                        +---------+---------------+---------+-----------+----------+--------------+ FV DistalFull                                                        +---------+---------------+---------+-----------+----------+--------------+ PFV      Full                                                        +---------+---------------+---------+-----------+----------+--------------+  POP      Full           Yes      Yes                                 +---------+---------------+---------+-----------+----------+--------------+ PTV      Full                                                         +---------+---------------+---------+-----------+----------+--------------+ PERO     Full                                                        +---------+---------------+---------+-----------+----------+--------------+   +---------+---------------+---------+-----------+----------+--------------+ LEFT     CompressibilityPhasicitySpontaneityPropertiesThrombus Aging +---------+---------------+---------+-----------+----------+--------------+ CFV      Full           Yes      Yes                                 +---------+---------------+---------+-----------+----------+--------------+ SFJ      Full                                                        +---------+---------------+---------+-----------+----------+--------------+ FV Prox  Full                                                        +---------+---------------+---------+-----------+----------+--------------+ FV Mid   Full                                                        +---------+---------------+---------+-----------+----------+--------------+ FV DistalFull                                                        +---------+---------------+---------+-----------+----------+--------------+ PFV      Full                                                        +---------+---------------+---------+-----------+----------+--------------+ POP      Full           Yes      Yes                                 +---------+---------------+---------+-----------+----------+--------------+  PTV      Full                                                        +---------+---------------+---------+-----------+----------+--------------+ PERO     Full                                                        +---------+---------------+---------+-----------+----------+--------------+     Summary: BILATERAL: - No evidence of deep vein thrombosis seen in the lower extremities, bilaterally. -No evidence of  popliteal cyst, bilaterally.   *See table(s) above for measurements and observations. Electronically signed by Jamelle Haring on 03/07/2020 at 5:50:28 PM.    Final    VAS [place] UPPER EXTREMITY VENOUS DUPLEX  Result Date: 03/07/2020 UPPER VENOUS STUDY  Indications: fever of unknown origin Limitations: Bandage at right neck. Comparison Study: No prior study Performing Technologist: Sharion Dove RVS  Examination Guidelines: A complete evaluation includes B-mode imaging, spectral Doppler, color Doppler, and power Doppler as needed of all accessible portions of each vessel. Bilateral testing is considered an integral part of a complete examination. Limited examinations for reoccurring indications may be performed as noted.  Right Findings: +----------+------------+---------+-----------+----------+-------+ RIGHT     CompressiblePhasicitySpontaneousPropertiesSummary +----------+------------+---------+-----------+----------+-------+ IJV                      Yes       Yes                      +----------+------------+---------+-----------+----------+-------+ Subclavian               Yes       Yes                      +----------+------------+---------+-----------+----------+-------+ Axillary                 Yes       Yes                      +----------+------------+---------+-----------+----------+-------+ Brachial      Full       Yes       Yes                      +----------+------------+---------+-----------+----------+-------+ Radial        Full                                          +----------+------------+---------+-----------+----------+-------+ Ulnar         Full                                          +----------+------------+---------+-----------+----------+-------+ Cephalic      Full                                          +----------+------------+---------+-----------+----------+-------+  Basilic       Full                                           +----------+------------+---------+-----------+----------+-------+  Left Findings: +----------+------------+---------+-----------+----------+-------+ LEFT      CompressiblePhasicitySpontaneousPropertiesSummary +----------+------------+---------+-----------+----------+-------+ IJV           Full       Yes       Yes                      +----------+------------+---------+-----------+----------+-------+ Subclavian               Yes       Yes                      +----------+------------+---------+-----------+----------+-------+ Axillary                 Yes       Yes                      +----------+------------+---------+-----------+----------+-------+ Brachial      Full       Yes       Yes                      +----------+------------+---------+-----------+----------+-------+ Radial        Full                                          +----------+------------+---------+-----------+----------+-------+ Ulnar         Full                                          +----------+------------+---------+-----------+----------+-------+ Cephalic      Full                                          +----------+------------+---------+-----------+----------+-------+  Summary:  Right: No evidence of deep vein thrombosis in the upper extremity. No evidence of superficial vein thrombosis in the upper extremity.  Left: No evidence of deep vein thrombosis in the upper extremity. No evidence of superficial vein thrombosis in the upper extremity.  *See table(s) above for measurements and observations.  Diagnosing physician: Jamelle Haring Electronically signed by Jamelle Haring on 03/07/2020 at 5:50:10 PM.    Final    . sodium chloride   Intravenous Once  . chlorhexidine gluconate (MEDLINE KIT)  15 mL Mouth Rinse BID  . Chlorhexidine Gluconate Cloth  6 each Topical Q0600  . Chlorhexidine Gluconate Cloth  6 each Topical Q0600  . dextromethorphan  30 mg Oral BID  . docusate sodium  100  mg Oral BID  . feeding supplement  237 mL Oral TID BM  . levETIRAcetam  250 mg Oral Q T,Th,Sa-HD  . levETIRAcetam  500 mg Oral BID  . mouth rinse  15 mL Mouth Rinse BID  . metoprolol tartrate  25 mg Oral BID  . multivitamin  1 tablet Oral QHS  . NIFEdipine  90 mg Oral Daily  . sodium chloride flush  10-40  mL Intracatheter Q12H    BMET    Component Value Date/Time   NA 133 (L) 03/08/2020 0046   K 3.6 03/08/2020 0046   CL 98 03/08/2020 0046   CO2 24 03/08/2020 0046   GLUCOSE 89 03/08/2020 0046   BUN 35 (H) 03/08/2020 0046   CREATININE 6.48 (H) 03/08/2020 0046   CALCIUM 7.1 (L) 03/08/2020 0046   CALCIUM 6.2 (LL) 02/26/2020 0305   GFRNONAA 7 (L) 03/08/2020 0046   GFRAA >60 01/28/2016 1543   CBC    Component Value Date/Time   WBC 11.8 (H) 03/08/2020 0046   RBC 2.26 (L) 03/08/2020 0046   HGB 6.6 (LL) 03/08/2020 0046   HCT 20.0 (L) 03/08/2020 0046   PLT 278 03/08/2020 0046   MCV 88.5 03/08/2020 0046   MCH 29.2 03/08/2020 0046   MCHC 33.0 03/08/2020 0046   RDW 14.9 03/08/2020 0046   LYMPHSABS 1.0 03/08/2020 0046   MONOABS 1.5 (H) 03/08/2020 0046   EOSABS 0.1 03/08/2020 0046   BASOSABS 0.0 03/08/2020 0046    Assessment/Plan:  1. 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 Appears was brewing in 12/2019 with PCP referring her to nephrology then, Cr was 1.8 at that time. Hep C neg, hep B s antigen neg. nontunneled catheter on 02/25/20. UA with >300 mg/dl protein and 0-5 RBC. ANA positive.ANCA negative. Complement normal. RPR nonreactive. Anti-GBM neg. ASO normal. SPEP no M spike. Free light chain ratio normal. Started on CRRT on 2/24 after nontunneled catheter with IR.IHD on 02/29/20. 1. 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  Awaiting stains and EM. 2. S/p 1stHD3/1/22and 2nd3/2/22 and 3rd tomorrow 03/06/20(no heparinwith any treatment due to kidney biopsy) 3. Will need  permanent access given oliguria and ongoing need for HD. 4. Appreciate IR who removed temp HD catheter 03/06/20 and will place [facility] on Monday given lack of renal recovery. 5. Will need to consult [facility] this week for access placement. 2. PEA arrest after temp HD catheter placed. Resolved and extubated. 3. ID - pt with low grade fevers and ongoing tachycardia.  ID following.  HD cath removed for line holiday.  Off abx and fever curve trending down. 4. HTN - ACEi on hold due to AKI 5. Vascular access- will need TDC and avf/avg placement this hospitalization if renal function does not recover soon. 1. S/p temp HD cath on 02/25/20 2. Will consult[facility] for [facility] and [facility] for AVF/AVG next week if no recovery of renal function. 6. Anemia - acute drop in Hgb from 7.8 to 6.6.  Will check guaiac.  Follow H/H. 7. Seizure activity - neurology consulted and felt to be related to metabolic derangements. On keppra per neuro. 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 Awaiting EM and stains. Hold off on prednisone until infectious etiology is ruled out.  Now with edema will dose with IV lasix after blood transfusion and follow.     Donetta Potts, MD [employer] [phone]

## 2020-03-09 ENCOUNTER — Inpatient Hospital Stay (HOSPITAL_COMMUNITY): Payer: 59

## 2020-03-09 DIAGNOSIS — N179 Acute kidney failure, unspecified: Secondary | ICD-10-CM | POA: Diagnosis not present

## 2020-03-09 DIAGNOSIS — J9621 Acute and chronic respiratory failure with hypoxia: Secondary | ICD-10-CM | POA: Diagnosis not present

## 2020-03-09 DIAGNOSIS — R8271 Bacteriuria: Secondary | ICD-10-CM

## 2020-03-09 DIAGNOSIS — D62 Acute posthemorrhagic anemia: Secondary | ICD-10-CM | POA: Diagnosis not present

## 2020-03-09 DIAGNOSIS — N051 Unspecified nephritic syndrome with focal and segmental glomerular lesions: Secondary | ICD-10-CM

## 2020-03-09 DIAGNOSIS — Z4659 Encounter for fitting and adjustment of other gastrointestinal appliance and device: Secondary | ICD-10-CM

## 2020-03-09 HISTORY — PX: IR US GUIDE VASC ACCESS RIGHT: IMG2390

## 2020-03-09 HISTORY — PX: IR FLUORO GUIDE CV LINE RIGHT: IMG2283

## 2020-03-09 LAB — CBC WITH DIFFERENTIAL/PLATELET
Abs Immature Granulocytes: 0.11 10*3/uL — ABNORMAL HIGH (ref 0.00–0.07)
Basophils Absolute: 0 10*3/uL (ref 0.0–0.1)
Basophils Relative: 0 %
Eosinophils Absolute: 0.1 10*3/uL (ref 0.0–0.5)
Eosinophils Relative: 1 %
HCT: 26.4 % — ABNORMAL LOW (ref 36.0–46.0)
Hemoglobin: 8.8 g/dL — ABNORMAL LOW (ref 12.0–15.0)
Immature Granulocytes: 1 %
Lymphocytes Relative: 8 %
Lymphs Abs: 0.8 10*3/uL (ref 0.7–4.0)
MCH: 29.7 pg (ref 26.0–34.0)
MCHC: 33.3 g/dL (ref 30.0–36.0)
MCV: 89.2 fL (ref 80.0–100.0)
Monocytes Absolute: 1.2 10*3/uL — ABNORMAL HIGH (ref 0.1–1.0)
Monocytes Relative: 12 %
Neutro Abs: 7.7 10*3/uL (ref 1.7–7.7)
Neutrophils Relative %: 78 %
Platelets: 301 10*3/uL (ref 150–400)
RBC: 2.96 MIL/uL — ABNORMAL LOW (ref 3.87–5.11)
RDW: 14.6 % (ref 11.5–15.5)
WBC: 9.9 10*3/uL (ref 4.0–10.5)
nRBC: 0 % (ref 0.0–0.2)

## 2020-03-09 LAB — RENAL FUNCTION PANEL
Albumin: 1.7 g/dL — ABNORMAL LOW (ref 3.5–5.0)
Anion gap: 13 (ref 5–15)
BUN: 46 mg/dL — ABNORMAL HIGH (ref 8–23)
CO2: 22 mmol/L (ref 22–32)
Calcium: 7.5 mg/dL — ABNORMAL LOW (ref 8.9–10.3)
Chloride: 96 mmol/L — ABNORMAL LOW (ref 98–111)
Creatinine, Ser: 8.33 mg/dL — ABNORMAL HIGH (ref 0.44–1.00)
GFR, Estimated: 5 mL/min — ABNORMAL LOW (ref >60)
Glucose, Bld: 104 mg/dL — ABNORMAL HIGH (ref 70–99)
Phosphorus: 5 mg/dL — ABNORMAL HIGH (ref 2.5–4.6)
Potassium: 3.5 mmol/L (ref 3.5–5.1)
Sodium: 131 mmol/L — ABNORMAL LOW (ref 135–145)

## 2020-03-09 LAB — TYPE AND SCREEN
ABO/RH(D): O POS
Antibody Screen: NEGATIVE
Unit division: 0

## 2020-03-09 LAB — CULTURE, BLOOD (ROUTINE X 2)
Culture: NO GROWTH
Culture: NO GROWTH

## 2020-03-09 LAB — BPAM RBC
Blood Product Expiration Date: 202204042359
ISSUE DATE / TIME: 202203060537
Unit Type and Rh: 5100

## 2020-03-09 LAB — CMV DNA, QUANTITATIVE, PCR
CMV DNA Quant: NEGATIVE IU/mL
Log10 CMV Qn DNA Pl: UNDETERMINED log10 IU/mL

## 2020-03-09 LAB — GLUCOSE, CAPILLARY
Glucose-Capillary: 95 mg/dL (ref 70–99)
Glucose-Capillary: 91 mg/dL (ref 70–99)
Glucose-Capillary: 81 mg/dL (ref 70–99)
Glucose-Capillary: 76 mg/dL (ref 70–99)

## 2020-03-09 LAB — PARVOVIRUS B19 ANTIBODY, IGG AND IGM
Parovirus B19 IgG Abs: 0.3 index (ref 0.0–0.8)
Parovirus B19 IgM Abs: 0.1 index (ref 0.0–0.8)

## 2020-03-09 LAB — MAGNESIUM: Magnesium: 1.9 mg/dL (ref 1.7–2.4)

## 2020-03-09 IMAGING — XA IR FLUORO GUIDE CV LINE*R*
2 series · 2 of 2 positions shown · non-contrast
Comparison: none

INDICATION: End-stage renal disease, no current access for dialysis

[Series 1: fl (-) angio · 1 of 1 slices shown]
[im 1/1]
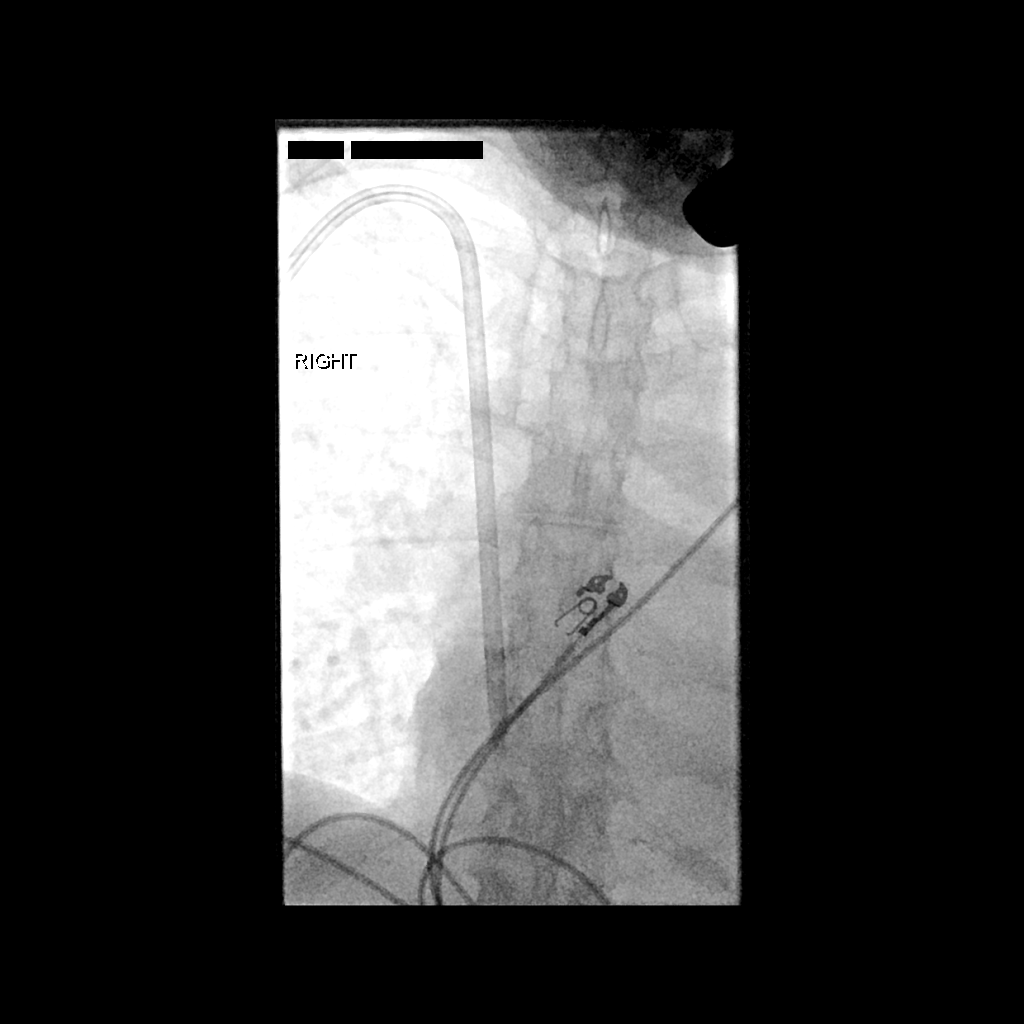

[Series 1: ir fluoro guide cv line*right* · 1 of 1 slices shown]
[im 1/1]
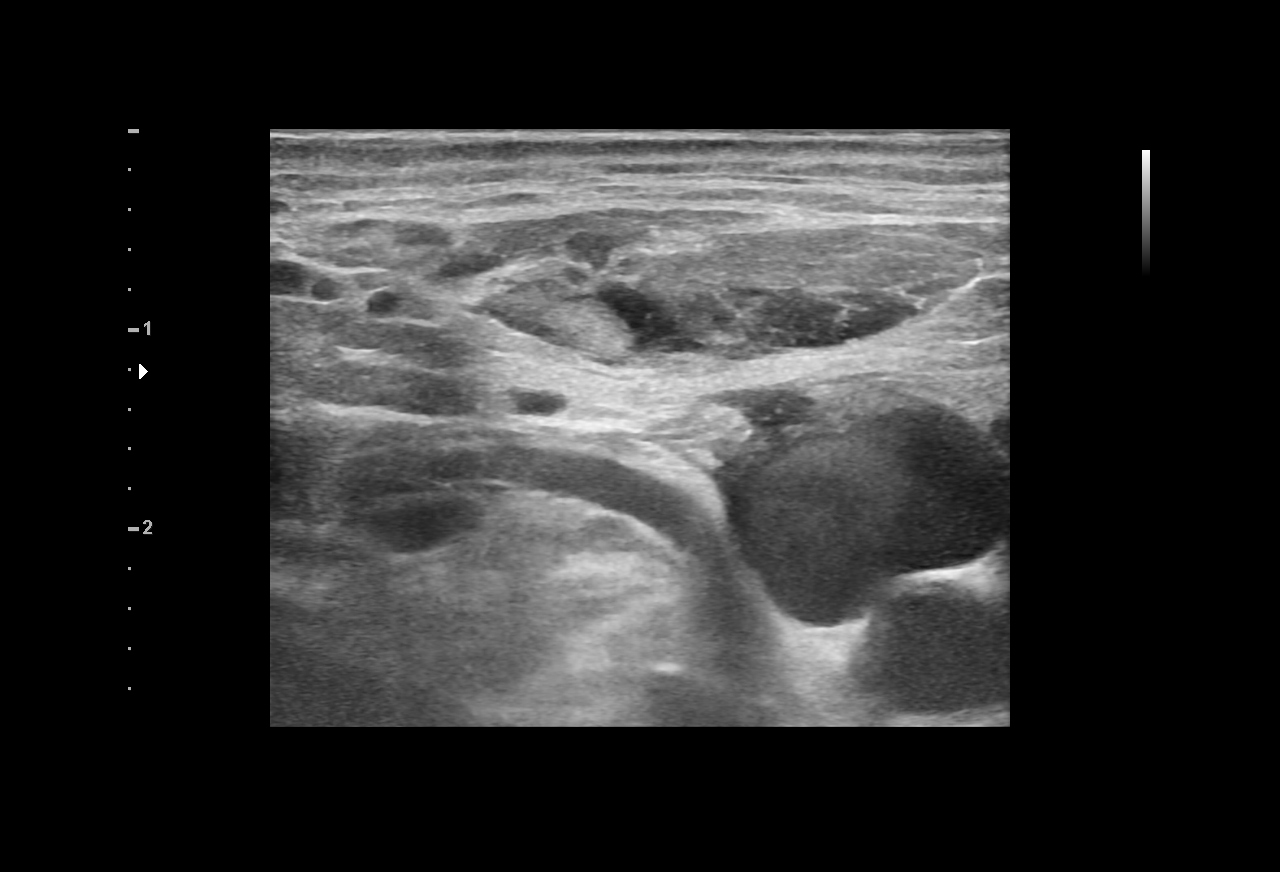

[2 of 2 positions shown; findings below may reference images not displayed]

EXAM:
ULTRASOUND GUIDANCE FOR VASCULAR ACCESS

RIGHT INTERNAL JUGULAR PERMANENT HEMODIALYSIS CATHETER

Date:  [DATE] [DATE] [DATE]

Radiologist:  AUJLA

Guidance:  Ultrasound and fluoroscopic

FLUOROSCOPY TIME:  Fluoroscopy Time: 0 minutes 36 seconds (2 mGy).

MEDICATIONS:
1 g vancomycin within 1 hour of the procedure

ANESTHESIA/SEDATION:
Versed 1.5 mg IV; Fentanyl 50 mcg IV;

Moderate Sedation Time:  16 minutes

The patient was continuously monitored during the procedure by the
interventional radiology nurse under my direct supervision.

CONTRAST:  None.

COMPLICATIONS:
None immediate.

PROCEDURE:
Informed consent was obtained from the patient following explanation
of the procedure, risks, benefits and alternatives. The patient
understands, agrees and consents for the procedure. All questions
were addressed. A time out was performed.

Maximal barrier sterile technique utilized including caps, mask,
sterile gowns, sterile gloves, large sterile drape, hand hygiene,
and 2% chlorhexidine scrub.

Under sterile conditions and local anesthesia, right internal
jugular micropuncture venous access was performed with ultrasound.
Images were obtained for documentation of the patent right internal
jugular vein. A guide wire was inserted followed by a transitional
dilator. Next, a 0.035 guidewire was advanced into the IVC with a
5-French catheter. Measurements were obtained from the right
venotomy site to the proximal right atrium. In the right
infraclavicular chest, a subcutaneous tunnel was created under
sterile conditions and local anesthesia. 1% lidocaine with
epinephrine was utilized for this. The 19 cm tip to cuff palindrome
catheter was tunneled subcutaneously to the venotomy site and
inserted into the SVC/RA junction through a valved peel-away sheath.
Position was confirmed with fluoroscopy. Images were obtained for
documentation. Blood was aspirated from the catheter followed by
saline and heparin flushes. The appropriate volume and strength of
heparin was instilled in each lumen. Caps were applied. The catheter
was secured at the tunnel site with Gelfoam and a pursestring
suture. The venotomy site was closed with subcuticular Vicryl
suture. Dermabond was applied to the small right neck incision. A
dry sterile dressing was applied. The catheter is ready for use. No
immediate complications.
IMPRESSION: Ultrasound and fluoroscopically guided right internal jugular
tunneled hemodialysis catheter (19 cm tip to cuff palindrome
catheter).

## 2020-03-09 MED ORDER — GELATIN ABSORBABLE 12-7 MM EX MISC
CUTANEOUS | Status: AC
  Filled 2020-03-09: qty 1

## 2020-03-09 MED ORDER — LIDOCAINE-EPINEPHRINE 1 %-1:100000 IJ SOLN
INTRAMUSCULAR | Status: AC
  Filled 2020-03-09: qty 1

## 2020-03-09 MED ORDER — FENTANYL CITRATE (PF) 100 MCG/2ML IJ SOLN
INTRAMUSCULAR | Status: AC
  Filled 2020-03-09: qty 2

## 2020-03-09 MED ORDER — METOPROLOL TARTRATE 50 MG PO TABS
50.0000 mg | ORAL_TABLET | Freq: Two times a day (BID) | ORAL | Status: DC
  Administered 2020-03-09 – 2020-03-11 (×6): 50 mg via ORAL
  Filled 2020-03-09 (×6): qty 1

## 2020-03-09 MED ORDER — KIDNEY FAILURE BOOK: Freq: Once | Status: AC

## 2020-03-09 MED ORDER — MIDAZOLAM HCL 2 MG/2ML IJ SOLN
INTRAMUSCULAR | Status: AC
  Filled 2020-03-09: qty 2

## 2020-03-09 MED ORDER — HEPARIN SODIUM (PORCINE) 1000 UNIT/ML IJ SOLN
INTRAMUSCULAR | Status: AC | PRN
  Administered 2020-03-09: 1000 [IU]

## 2020-03-09 MED ORDER — GELATIN ABSORBABLE 12-7 MM EX MISC
CUTANEOUS | Status: AC | PRN
  Administered 2020-03-09: 1 via TOPICAL

## 2020-03-09 MED ORDER — FENTANYL CITRATE (PF) 100 MCG/2ML IJ SOLN
INTRAMUSCULAR | Status: AC | PRN
  Administered 2020-03-09 (×2): 25 ug via INTRAVENOUS

## 2020-03-09 MED ORDER — MIDAZOLAM HCL 2 MG/2ML IJ SOLN
INTRAMUSCULAR | Status: AC | PRN
  Administered 2020-03-09: 1 mg via INTRAVENOUS
  Administered 2020-03-09: 0.5 mg via INTRAVENOUS

## 2020-03-09 MED ORDER — HEPARIN SODIUM (PORCINE) 1000 UNIT/ML IJ SOLN
INTRAMUSCULAR | Status: AC
  Filled 2020-03-09: qty 1

## 2020-03-09 MED ORDER — LIDOCAINE-EPINEPHRINE 1 %-1:100000 IJ SOLN
INTRAMUSCULAR | Status: AC | PRN
  Administered 2020-03-09: 30 mL via INTRADERMAL

## 2020-03-09 NOTE — Progress Notes (Signed)
Subjective: No new complaints   Antibiotics:  Anti-infectives (From admission, onward)   Start     Dose/Rate Route Frequency Ordered Stop   03/09/20 0600  vancomycin (VANCOREADY) IVPB 1000 mg/200 mL        1,000 mg 200 mL/hr over 60 Minutes Intravenous On call 03/06/20 1647 03/10/20 0600   03/06/20 1200  vancomycin (VANCOCIN) IVPB 750 mg/150 ml premix  Status:  Discontinued        750 mg 150 mL/hr over 60 Minutes Intravenous  Once 03/05/20 1426 03/05/20 1447   03/05/20 1800  vancomycin (VANCOCIN) IVPB 750 mg/150 ml premix  Status:  Discontinued        750 mg 150 mL/hr over 60 Minutes Intravenous  Once 03/05/20 0831 03/05/20 0834   03/05/20 0930  vancomycin (VANCOREADY) IVPB 1500 mg/300 mL  Status:  Discontinued        1,500 mg 150 mL/hr over 120 Minutes Intravenous  Once 03/05/20 0831 03/05/20 1447   03/05/20 0930  ceFEPIme (MAXIPIME) 1 g in sodium chloride 0.9 % 100 mL IVPB  Status:  Discontinued        1 g 200 mL/hr over 30 Minutes Intravenous Every 24 hours 03/05/20 0831 03/05/20 1447   02/29/20 1000  cefTRIAXone (ROCEPHIN) 2 g in sodium chloride 0.9 % 100 mL IVPB  Status:  Discontinued        2 g 200 mL/hr over 30 Minutes Intravenous Every 24 hours 02/29/20 0910 03/05/20 0756      Medications: Scheduled Meds: . heparin sodium (porcine)      . lidocaine-EPINEPHrine      . sodium chloride   Intravenous Once  . chlorhexidine gluconate (MEDLINE KIT)  15 mL Mouth Rinse BID  . Chlorhexidine Gluconate Cloth  6 each Topical Q0600  . Chlorhexidine Gluconate Cloth  6 each Topical Q0600  . dextromethorphan  30 mg Oral BID  . docusate sodium  100 mg Oral BID  . feeding supplement  237 mL Oral TID BM  . levETIRAcetam  250 mg Oral Q T,Th,Sa-HD  . levETIRAcetam  500 mg Oral BID  . mouth rinse  15 mL Mouth Rinse BID  . metoprolol tartrate  50 mg Oral BID  . multivitamin  1 tablet Oral QHS  . NIFEdipine  90 mg Oral Daily  . sodium chloride flush  10-40 mL Intracatheter  Q12H   Continuous Infusions: . sodium chloride Stopped (02/28/20 1701)  . albumin human 25 g (03/09/20 0821)  . vancomycin     PRN Meds:.sodium chloride, acetaminophen, antiseptic oral rinse, guaiFENesin, heparin sodium (porcine), labetalol, lidocaine-EPINEPHrine, LORazepam, melatonin, ondansetron **OR** ondansetron (ZOFRAN) IV, oxyCODONE, sodium chloride flush    Objective: Weight change:  No intake or output data in the 24 hours ending 03/09/20 1306 Blood pressure (!) 155/94, pulse 86, temperature 98.9 F (37.2 C), temperature source Oral, resp. rate (!) 22, height 5' 2.99" (1.6 m), weight 75.1 kg, SpO2 97 %. Temp:  [98.6 F (37 C)-99.7 F (37.6 C)] 98.9 F (37.2 C) (03/07 1045) Pulse Rate:  [86-112] 86 (03/07 1045) Resp:  [20-27] 22 (03/07 1045) BP: (133-160)/(82-96) 155/94 (03/07 1045) SpO2:  [92 %-97 %] 97 % (03/07 1045)  Physical Exam: Physical Exam Constitutional:      General: She is not in acute distress.    Appearance: She is well-developed. She is not diaphoretic.  HENT:     Head: Normocephalic and atraumatic.     Right Ear: External ear normal.  Left Ear: External ear normal.     Mouth/Throat:     Pharynx: No oropharyngeal exudate.  Eyes:     General: No scleral icterus.    Conjunctiva/sclera: Conjunctivae normal.     Pupils: Pupils are equal, round, and reactive to light.  Cardiovascular:     Rate and Rhythm: Normal rate and regular rhythm.  Pulmonary:     Effort: Pulmonary effort is normal. No respiratory distress.     Breath sounds: Normal breath sounds. No wheezing.  Abdominal:     General: Bowel sounds are normal. There is no distension.     Palpations: Abdomen is soft.  Musculoskeletal:        General: No tenderness.  Lymphadenopathy:     Cervical: No cervical adenopathy.  Skin:    General: Skin is warm and dry.     Coloration: Skin is not pale.     Findings: No erythema or rash.  Neurological:     General: No focal deficit present.      Mental Status: She is alert and oriented to person, place, and time.     Motor: No abnormal muscle tone.     Coordination: Coordination normal.  Psychiatric:        Mood and Affect: Mood normal.        Behavior: Behavior normal.        Thought Content: Thought content normal.        Judgment: Judgment normal.      CBC:    BMET Recent Labs    03/08/20 0046 03/09/20 0150  NA 133* 131*  K 3.6 3.5  CL 98 96*  CO2 24 22  GLUCOSE 89 104*  BUN 35* 46*  CREATININE 6.48* 8.33*  CALCIUM 7.1* 7.5*     Liver Panel  Recent Labs    03/09/20 0150  ALBUMIN 1.7*       Sedimentation Rate No results for input(s): ESRSEDRATE in the last 72 hours. C-Reactive Protein No results for input(s): CRP in the last 72 hours.  Micro Results: Recent Results (from the past 720 hour(s))  Resp Panel by RT-PCR (Flu A&B, Covid) Nasopharyngeal Swab     Status: None   Collection Time: 02/25/20  6:12 PM   Specimen: Nasopharyngeal Swab; Nasopharyngeal(NP) swabs in vial transport medium  Result Value Ref Range Status   SARS Coronavirus 2 by RT PCR NEGATIVE NEGATIVE Final    Comment: (NOTE) SARS-CoV-2 target nucleic acids are NOT DETECTED.  The SARS-CoV-2 RNA is generally detectable in upper respiratory specimens during the acute phase of infection. The lowest concentration of SARS-CoV-2 viral copies this assay can detect is 138 copies/mL. A negative result does not preclude SARS-Cov-2 infection and should not be used as the sole basis for treatment or other patient management decisions. A negative result may occur with  improper specimen collection/handling, submission of specimen other than nasopharyngeal swab, presence of viral mutation(s) within the areas targeted by this assay, and inadequate number of viral copies(<138 copies/mL). A negative result must be combined with clinical observations, patient history, and epidemiological information. The expected result is Negative.  Fact  Sheet for Patients:  EntrepreneurPulse.com.au  Fact Sheet for Healthcare Providers:  IncredibleEmployment.be  This test is no t yet approved or cleared by the [place] FDA and  has been authorized for detection and/or diagnosis of SARS-CoV-2 by FDA under an Emergency Use Authorization (EUA). This EUA will remain  in effect (meaning this test can be used) for the duration of the COVID-19  declaration under Section 564(b)(1) of the Act, 21 U.S.C.section 360bbb-3(b)(1), unless the authorization is terminated  or revoked sooner.       Influenza A by PCR NEGATIVE NEGATIVE Final   Influenza B by PCR NEGATIVE NEGATIVE Final    Comment: (NOTE) The Xpert Xpress SARS-CoV-2/FLU/RSV plus assay is intended as an aid in the diagnosis of influenza from Nasopharyngeal swab specimens and should not be used as a sole basis for treatment. Nasal washings and aspirates are unacceptable for Xpert Xpress SARS-CoV-2/FLU/RSV testing.  Fact Sheet for Patients: EntrepreneurPulse.com.au  Fact Sheet for Healthcare Providers: IncredibleEmployment.be  This test is not yet approved or cleared by the [place] FDA and has been authorized for detection and/or diagnosis of SARS-CoV-2 by FDA under an Emergency Use Authorization (EUA). This EUA will remain in effect (meaning this test can be used) for the duration of the COVID-19 declaration under Section 564(b)(1) of the Act, 21 U.S.C. section 360bbb-3(b)(1), unless the authorization is terminated or revoked.  Performed at [facility], [place] [address] [place], [place] [address]   MRSA PCR Screening     Status: None   Collection Time: 02/26/20  4:00 PM   Specimen: Nasopharyngeal  Result Value Ref Range Status   MRSA by PCR NEGATIVE NEGATIVE Final    Comment:        The GeneXpert MRSA Assay (FDA approved for NASAL specimens only), is one component of a comprehensive  MRSA colonization surveillance program. It is not intended to diagnose MRSA infection nor to guide or monitor treatment for MRSA infections. Performed at [facility], [place] [address], [place], [place] [address]   Culture, Urine     Status: None   Collection Time: 02/26/20  5:08 PM   Specimen: Urine, Random  Result Value Ref Range Status   Specimen Description URINE, RANDOM  Final   Special Requests NONE  Final   Culture   Final    NO GROWTH Performed at [facility], [place] [address] [place], [place] [address]    Report Status 02/27/2020 FINAL  Final  Culture, blood (routine x 2)     Status: None   Collection Time: 02/26/20 10:43 PM   Specimen: BLOOD  Result Value Ref Range Status   Specimen Description BLOOD SITE NOT SPECIFIED  Final   Special Requests   Final    AEROBIC BOTTLE ONLY Blood Culture results may not be optimal due to an inadequate volume of blood received in culture bottles   Culture   Final    NO GROWTH 5 DAYS Performed at [facility], [place] [address] [place], [place] [address]    Report Status 03/02/2020 FINAL  Final  Culture, blood (routine x 2)     Status: None   Collection Time: 02/26/20 10:43 PM   Specimen: BLOOD  Result Value Ref Range Status   Specimen Description BLOOD SITE NOT SPECIFIED  Final   Special Requests AEROBIC BOTTLE ONLY Blood Culture adequate volume  Final   Culture   Final    NO GROWTH 5 DAYS Performed at [place] [address], [place], [place] [address]    Report Status 03/02/2020 FINAL  Final  Urine Culture     Status: None   Collection Time: 03/04/20  5:30 AM   Specimen: Urine, Random  Result Value Ref Range Status   Specimen Description URINE, RANDOM  Final   Special Requests STERILE CUP  Final   Culture  Final    NO GROWTH Performed at [facility], [place] [address] [place], [place] [address]    Report Status 03/05/2020 FINAL  Final  Culture, blood (routine x 2)     Status: None    Collection Time: 03/04/20  8:48 PM   Specimen: BLOOD  Result Value Ref Range Status   Specimen Description BLOOD LEFT ANTECUBITAL  Final   Special Requests   Final    BOTTLES DRAWN AEROBIC AND ANAEROBIC Blood Culture results may not be optimal due to an inadequate volume of blood received in culture bottles   Culture   Final    NO GROWTH 5 DAYS Performed at [facility], [place] [address] [place], [place] [address]    Report Status 03/09/2020 FINAL  Final  Culture, blood (routine x 2)     Status: None   Collection Time: 03/04/20  8:48 PM   Specimen: BLOOD LEFT HAND  Result Value Ref Range Status   Specimen Description BLOOD LEFT HAND  Final   Special Requests   Final    BOTTLES DRAWN AEROBIC AND ANAEROBIC Blood Culture results may not be optimal due to an inadequate volume of blood received in culture bottles   Culture   Final    NO GROWTH 5 DAYS Performed at [facility], [place] [address] [place], [place] [address]    Report Status 03/09/2020 FINAL  Final    Studies/Results: No results found.    Assessment/Plan:  INTERVAL HISTORY:  Fevers have largely disappeared after her dialysis catheter was removed.   Principal Problem:   AKI (acute kidney injury) ([place]) Active Problems:   Normocytic anemia   Uremia   HTN (hypertension)   Perinephric hematoma   Seizure ([place])   Lobar pneumonia (HCC)   Sepsis ([place]) versus SIRS due to autoimmune process   Fever   Glomerulonephritis   ABLA (acute blood loss anemia)   Pleural effusion    Barbara Crawford is a 65 y.o. female with history of hypertension recent Covid infection admitted with progressive fatigue severe back pain and pain behind her shoulder blades lower extreme edema and found to have nephrotic syndrome with acute kidney injury.  She has had a fever during the course of her stay with negative work-up for an infectious cause of it.  Nephrotic syndrome with FSGS:  She did have COVID-19 infection  apparently in December.  She has been receiving dialysis.  She was on antibiotics were stopped last week given that we had no clear target for them.  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  I did send off an HIV RNA which was negative.  CMV serologies were surprisingly negative and EBV was consistent with past infection.   CT of the chest was without any evidence of infection  Her temporary catheter was removed and she has been afebrile since then. Echocardiogram did show a pericardial effusion but I suspect this is not infectious effusion   Dopplers of her upper and lower extremities were negative for DVT  I do not think her FSGS or nephrotic syndrome is related to an infectious disease.  Fevers: Not clear what the source of these was but they have disappeared after dialysis catheter was removed.  We will sign off for now please call with further questions.    LOS: 13 days   Alcide Evener 03/09/2020, 1:06 PM

## 2020-03-09 NOTE — Procedures (Signed)
Interventional Radiology Procedure Note ? ?Procedure: RT IJ HD CATH   ? ?Complications: None ? ?Estimated Blood Loss:  MIN ? ?Findings: ?TIP SVCRA   ? ?M. TREVOR Adelbert Gaspard, MD ? ? ? ?

## 2020-03-09 NOTE — TOC Progression Note (Signed)
Transition of Care [facility]) - Progression Note    Patient Details  Name: Willene Dimattia MRN: [id number] Date of Birth: 30-Aug-1955  Transition of [facility]) CM/SW Contact  Joanne Chars, LCSW Phone Number: 03/09/2020, 2:07 PM  Clinical Narrative:   DME ordered through [place].  HH through [place].    Expected Discharge Plan: [place] Barriers to Discharge: Continued Medical Work up  Expected Discharge Plan and Services Expected Discharge Plan: [place]: [place] arrangements for the past 2 months: Apartment                           HH Arranged: PT,OT [place]: [facility] [employer] Date [place]: 03/09/20 Time [place]: 1100 Representative spoke with at [place]: [place]   Social Determinants of Health ([place]) Interventions    Readmission Risk Interventions No flowsheet data found.

## 2020-03-09 NOTE — Progress Notes (Signed)
   Patient off floor in IR for [facility] I have ordered vein mapping We will see her later today or tomorrow  Roxy Horseman PA-C

## 2020-03-09 NOTE — Plan of Care (Signed)
  Problem: Clinical Measurements: Goal: Respiratory complications will improve Outcome: Progressing   Problem: Clinical Measurements: Goal: Cardiovascular complication will be avoided Outcome: Progressing   Problem: Activity: Goal: Risk for activity intolerance will decrease Outcome: Progressing   Problem: Pain Managment: Goal: General experience of comfort will improve Outcome: Progressing   Problem: Respiratory: Goal: Respiratory symptoms related to disease process will be avoided Outcome: Progressing

## 2020-03-09 NOTE — Consult Note (Addendum)
[facility] NOTE   MRN : [id number]  Reason for Consult: ESRD Referring Physician: Dr. Joylene Grapes Nephrology  History of Present Illness: 65 y/o female with history of HTN presented with symptoms of AKI.  She had COVID + in 01/2020 (unvaccinated), since then has recovered.    She developed low back pain, edema in B LE and decreased urine OP.  She had  PEA arrest with seizure-like activityrequiring 2 min of CPR.  She was transferred to ICU.  She was extubated 02/28/20.   She had temp catheter placed and developed fevers.  The temp cath was removed and the fevers are now gone.  New right IJ catheter was placed today.  We have been asked to provide permanent access.  She is right hand dominant and has no chest implants.        Current Facility-Administered Medications  Medication Dose Route Frequency Provider Last Rate Last Admin  . 0.9 %  sodium chloride infusion (Manually program via Guardrails IV Fluids)   Intravenous Once Shela Leff, MD      . 0.9 %  sodium chloride infusion   Intravenous PRN Hunsucker, Bonna Gains, MD   Stopped at 02/28/20 1701  . acetaminophen (TYLENOL) tablet 1,000 mg  1,000 mg Oral Q8H PRN Shela Leff, MD   1,000 mg at 03/07/20 2130  . albumin human 25 % solution 25 g  25 g Intravenous Daily Donato Heinz, MD 60 mL/hr at 03/09/20 0821 25 g at 03/09/20 0821  . antiseptic oral rinse (BIOTENE) solution 15 mL  15 mL Mouth Rinse PRN Regalado, Belkys A, MD      . chlorhexidine gluconate (MEDLINE KIT) (PERIDEX) 0.12 % solution 15 mL  15 mL Mouth Rinse BID Mannam, Praveen, MD   15 mL at 03/09/20 0815  . Chlorhexidine Gluconate Cloth 2 % PADS 6 each  6 each Topical Q0600 Hunsucker, Bonna Gains, MD   6 each at 03/09/20 0815  . Chlorhexidine Gluconate Cloth 2 % PADS 6 each  6 each Topical Q0600 Claudia Desanctis, MD   6 each at 03/09/20 0654  . dextromethorphan (DELSYM) 30 MG/5ML liquid 30 mg  30 mg Oral BID Rama, Venetia Maxon, MD   30  mg at 03/09/20 0815  . docusate sodium (COLACE) capsule 100 mg  100 mg Oral BID Simonne Maffucci B, MD   100 mg at 03/09/20 0816  . feeding supplement (ENSURE ENLIVE / ENSURE PLUS) liquid 237 mL  237 mL Oral TID BM Simonne Maffucci B, MD   237 mL at 03/08/20 2159  . guaiFENesin (ROBITUSSIN) 100 MG/5ML solution 100 mg  5 mL Oral Q4H PRN Wendee Beavers T, MD   100 mg at 03/03/20 2146  . labetalol (NORMODYNE) injection 5 mg  5 mg Intravenous Q2H PRN Marianna Payment, MD   5 mg at 03/04/20 1622  . levETIRAcetam (KEPPRA) tablet 250 mg  250 mg Oral Q T,Th,Sa-HD Simonne Maffucci B, MD   250 mg at 02/29/20 1108  . levETIRAcetam (KEPPRA) tablet 500 mg  500 mg Oral BID Amie Portland, MD   500 mg at 03/09/20 0815  . LORazepam (ATIVAN) injection 1-2 mg  1-2 mg Intravenous Q15 min PRN Marianna Payment, MD   2 mg at 03/04/20 2102  . MEDLINE mouth rinse  15 mL Mouth Rinse BID Simonne Maffucci B, MD   15 mL at 03/09/20 0819  . melatonin tablet 3 mg  3 mg Oral QHS PRN Juanito Doom, MD  3 mg at 03/08/20 2205  . metoprolol tartrate (LOPRESSOR) tablet 50 mg  50 mg Oral BID Dwyane Dee, MD   50 mg at 03/09/20 0816  . multivitamin (RENA-VIT) tablet 1 tablet  1 tablet Oral QHS Rama, Venetia Maxon, MD   1 tablet at 03/08/20 2202  . NIFEdipine (PROCARDIA-XL/NIFEDICAL-XL) 24 hr tablet 90 mg  90 mg Oral Daily Claudia Desanctis, MD   90 mg at 03/09/20 0816  . ondansetron (ZOFRAN) tablet 4 mg  4 mg Oral Q6H PRN Juanito Doom, MD       Or  . ondansetron (ZOFRAN) injection 4 mg  4 mg Intravenous Q6H PRN Simonne Maffucci B, MD      . oxyCODONE (Oxy IR/ROXICODONE) immediate release tablet 5 mg  5 mg Oral Q4H PRN Chotiner, Yevonne Aline, MD   5 mg at 03/03/20 0455  . sodium chloride flush (NS) 0.9 % injection 10-40 mL  10-40 mL Intracatheter Q12H Rigoberto Noel, MD   10 mL at 03/09/20 0820  . sodium chloride flush (NS) 0.9 % injection 10-40 mL  10-40 mL Intracatheter PRN Rigoberto Noel, MD      . vancomycin Alcus Dad) IVPB 1000  mg/200 mL  1,000 mg Intravenous On Call Docia Barrier, PA 200 mL/hr at 03/09/20 1442 1,000 mg at 03/09/20 1442    Pt meds include: Statin :No Betablocker: Yes ASA: No Other anticoagulants/antiplatelets: none  Past Medical History:  Diagnosis Date  . Hypertension     Past Surgical History:  Procedure Laterality Date  . IR FLUORO GUIDE CV LINE RIGHT  02/26/2020  . IR FLUORO GUIDE CV LINE RIGHT  03/09/2020  . IR US GUIDE VASC ACCESS RIGHT  02/26/2020  . IR US GUIDE VASC ACCESS RIGHT  03/09/2020    Social History Social History   Tobacco Use  . Smoking status: Never Smoker  Substance Use Topics  . Alcohol use: Yes  . Drug use: No    Family History No family history on file.  Allergies  Allergen Reactions  . Erythromycin Other (See Comments)    Pt does not recall  . Iodine Other (See Comments)    Arm flares up per pt  . Penicillins     Has patient had a PCN reaction causing immediate rash, facial/tongue/throat swelling, SOB or lightheadedness with hypotension: no Has patient had a PCN reaction causing severe rash involving mucus membranes or skin necrosis: yes Has patient had a PCN reaction that required hospitalization: no Has patient had a PCN reaction occurring within the last 10 years: no If all of the above answers are "NO", then may proceed with Cephalosporin use.  Marland [employer]      REVIEW OF SYSTEMS  General: [ ]  Weight loss, [ ]  Fever, [ ]  chills Neurologic: [ ]  Dizziness, [ ]  Blackouts, [ ]  Seizure [ ]  Stroke, [ ]  "Mini stroke", [ ]  Slurred speech, [ ]  Temporary blindness; [ ]  weakness in arms or legs, [ ]  Hoarseness [ ]  Dysphagia Cardiac: [ ]  Chest pain/pressure, [ ]  Shortness of breath at rest [ ]  Shortness of breath with exertion, [ ]  Atrial fibrillation or irregular heartbeat  Vascular: [ ]  Pain in legs with walking, [ ]  Pain in legs at rest, [ ]  Pain in legs at night,  [ ]  Non-healing ulcer, [ ]  Blood clot in vein/DVT,    Pulmonary: [ ]  Home oxygen, [ ]  Productive cough, [ ]  Coughing up blood, [ ]  Asthma,  [ ]  Wheezing [ ]   COPD Musculoskeletal:  [ ]  Arthritis, [ ]  Low back pain, [ ]  Joint pain Hematologic: [ ]  Easy Bruising, [ ]  Anemia; [ ]  Hepatitis Gastrointestinal: [ ]  Blood in stool, [ ]  Gastroesophageal Reflux/heartburn, Urinary: [ ]  chronic Kidney disease, [ ]  on HD - [ ]  MWF or [ ]  TTHS, [ ]  Burning with urination, [ ]  Difficulty urinating Skin: [ ]  Rashes, [ ]  Wounds Psychological: [ ]  Anxiety, [ ]  Depression  Physical Examination Vitals:   03/09/20 1350 03/09/20 1405 03/09/20 1420 03/09/20 1448  BP: (!) 156/82 (!) 157/79 (!) 148/89   Pulse: 97 93 91 91  Resp: (!) 26 (!) 22 (!) 24 18  Temp:      TempSrc:      SpO2: 98% 96% 98% 95%  Weight:      Height:       Body mass index is 29.34 kg/m.  General:  WDWN in NAD Gait: Normal HENT: WNL Eyes: Pupils equal Pulmonary: normal non-labored breathing  Cardiac: RRR, without  Murmurs, rubs or gallops; No carotid bruits Abdomen: soft, NT, no masses Skin: no rashes, ulcers noted;  no Gangrene , no cellulitis; no open wounds;   Vascular Exam/Pulses:palpable radial pulses B UE   Musculoskeletal: no muscle wasting or atrophy; B LE edema  Neurologic: A&O X 3; Appropriate Affect ;  SENSATION: normal; MOTOR FUNCTION: 5/5 Symmetric Speech is fluent/normal   Significant Diagnostic Studies: CBC Lab Results  Component Value Date   WBC 9.9 03/09/2020   HGB 8.8 (L) 03/09/2020   HCT 26.4 (L) 03/09/2020   MCV 89.2 03/09/2020   PLT 301 03/09/2020    BMET    Component Value Date/Time   NA 131 (L) 03/09/2020 0150   K 3.5 03/09/2020 0150   CL 96 (L) 03/09/2020 0150   CO2 22 03/09/2020 0150   GLUCOSE 104 (H) 03/09/2020 0150   BUN 46 (H) 03/09/2020 0150   CREATININE 8.33 (H) 03/09/2020 0150   CALCIUM 7.5 (L) 03/09/2020 0150   CALCIUM 6.2 (LL) 02/26/2020 0305   GFRNONAA 5 (L) 03/09/2020 0150   GFRAA >60 01/28/2016 1543   Estimated  Creatinine Clearance: 6.7 mL/min (A) (by C-G formula based on SCr of 8.33 mg/dL (H)).  COAG Lab Results  Component Value Date   INR 1.2 03/05/2020   INR 1.0 02/26/2020     Non-Invasive Vascular Imaging:  Vein mapping ordered  ASSESSMENT/PLAN:  AKI on CKD Right TDC placed by IR today She is right hand dominant.  We will plan AV fistula verses graft will decide which arm based on vein map.  Dr. Oneida Alar will see her and schedule her for surgery.   Roxy Horseman 03/09/2020 3:41 PM   Pt with intermittent fevers apparently resolving.  No leukocytosis.  Has tunneled HD cath on right side placed today.  ID had seen and signed off no obvious source of fever.  Pt is right handed no prior access.  Will get vein map and plan access accordingly as long as she remains afebrile.  Ruta Hinds, MD [facility] [place] Office: [phone]

## 2020-03-09 NOTE — Progress Notes (Signed)
Rounded on patient today in correlation to transition to outpatient HD. Patient found sitting up in chair and agreeable to conversation. Ordered consult to dietician and Kidney Failure book.   Patient educated at the bedside regarding care of tunneled dialysis catheter which patient reports is to be placed today. Call to IR for scheduled time so patient may prepare. Also educated on proper medication administration on HD days.  Patient also educated on the importance of adhering to scheduled dialysis treatments, the effects of fluid overload, hyperkalemia and hyperphosphatemia. Patient capable of re-verbalizing via teach back method.   Patient with detailed questions regarding diet and fluid restrictions. This nurse covered the basics but did inform her that a dietician would speak with her for any specific education she requires. Also educated patient on services available through the interdisciplinary team in the clinic setting and the differences she will note when transitioning to the outpatient setting from the hospital. Patient with no further questions at this time. Handouts and contact information provided to patient for any further assistance. Will follow as appropriate.   Dorthey Sawyer, RN  Dialysis Nurse Coordinator Phone: [phone]

## 2020-03-09 NOTE — Progress Notes (Signed)
PT Cancellation Note  Patient Details Name: Hanh Pinks MRN: [id number] DOB: November 19, 1955   Cancelled Treatment:    Reason Eval/Treat Not Completed: Patient at procedure or test/unavailable  Attempted to see twice today: 1) with another discipline, 2) off the unit for procedure.   Arby Barrette, PT Pager [phone]  [name] 03/09/2020, 1:17 PM

## 2020-03-09 NOTE — Progress Notes (Signed)
PROGRESS NOTE    Barbara Crawford   ZOX:[id number]  DOB: [birth year]  DOA: 02/25/2020     13  PCP: Vonna Drafts, FNP  CC: LE swelling, malaise/fatigue  Hospital Course: [name] is a 65 yo female with PMH HTN, prior Covid (Dec 2021) who presented to the hospital with complaints of lower extremity swelling, back pain, generalized malaise/fatigue at home. She underwent extensive work-up and is followed by nephrology, ID.  In the ED, she was found to have a hemoglobin of 6.3, potassium 5.4, sodium 130, bicarb 9, BUN 144, and creatinine 29.98.  CT of the abdomen and pelvis was negative for obstruction or acute abnormality.  Temporary hemodialysis catheter was placed by IR and subsequently the patient had a PEA arrest associated with seizure-like activity.  She was intubated and subsequently extubated 02/28/2020.    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 Renal biopsy done 03/03/2019.  ANA positive.  ID is following for fevers and infectious etiology possibly underlying.   Trialysis cath removed on 03/06/20.   See below for problem based plans.    Interval History:  No events overnight.  Ongoing swelling noted in her legs.  Remains on room air and is breathing comfortably.  Still has significant anxiety associated with all the events of her hospitalization but continues to process and cope fairly well.  ROS: Constitutional: positive for fatigue and malaise, Respiratory: negative for wheezing, Cardiovascular: negative for chest pain and Gastrointestinal: negative for abdominal pain  Assessment & Plan: * AKI (acute kidney injury) ([place]) - Patient presented with elevation of BUN (144) and creat (29.98) -No prior known renal dysfunction and creatinine normal in 2018 - Renal US showed echogenic kidneys. CT renal stone study showed non-obstructive right nephrolithiasis. 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 ANA positive, no M spike  detected on protein electrophoresis, ASO negative, C3 117, C4 35, ANCA negative,GBM antibody negative.RPR non-reactive.  dsDNA negative, SCL 70 Positive+ (1.9), SSA IgG > 8+, SSB normal,  Chromatin antibody positive.   - Status post renal biopsy 03/02/2020.  Developed a perinephric hematoma post biopsy.   - Continue hemodialysis per nephrology; awaiting formal biopsy results prior to decision on ESRD diagnosis - s/p removal of HD-cath on 03/06/20; tentative plan for permcath on 3/7  Fever - afebrile now > 48 hours  - Differential broad: PNA vs atelectasis vs autoimmune mediated vs infected HD port (now removed 03/06/20) vs other - ID now on board; extensive workup underway - s/p HD cath removed on 3/4 - repeat blood cultures if recurrent fever - no abx for now per ID due to u/k source if any  -CT chest performed on 03/06/2020: Moderate bilateral pleural effusions, compressive atelectasis left greater than right, reactive mediastinal lymphadenopathy - 3/5: UE/LE duplex negative for DVTs -Incentive spirometer ordered  Pleural effusion - moderate sized on CT on 3/4; no obvious HF on echo (EF 50-55%). Etiology possibly due to third spacing (albumin 1.4)  - given poor UOP likely to not respond well to lasix/albumin challenge  - may benefit from change in diet; will consult RD - continue on HD for now and will monitor change in effusion with periodic CXR or if patient starts becoming SOB/hypoxic (currently on RA) - follow up lasix response started 3/6 per nephrology; may benefit from adding albumin as well (will discuss with nephrology)  Glomerulonephritis - see AKI - awaiting renal biopsy results   Lobar pneumonia ([place]) -See fever work-up as well.  Extubated  on 02/28/2020 -Has been treated with antibiotics -Procalcitonin remains elevated although may be some confounding from underlying ESRD -No sputum able to be collected.  Blood cultures remain negative.  No growth on urine culture either -CT  chest ordered per ID on 03/06/2020: shows B/L effusions, atelectasis but no obvious infiltrates  Normocytic anemia - Developed acute onset anemia s/p kidney biopsy from perinephric hematoma.  Received 2 units of PRBCs 03/03/2020.  - Transfuse for hemoglobin less than 7.   - Continue Aranesp per nephrology. - CBC daily  - Hgb 6.6 g/dL on 3/6, s/p 1 unit PRBC; repeat Hgb 8.8 g/dL this am (3/7)  Seizure ([address]) - Patient had seizure with lossof pulse and PEA arrest s/p CPR. - Patient was subsequently evaluated by neurology who restarted patient on Keppra.  - Plan is to discharge patient on Keppra 500 mg twice daily.  ABLA (acute blood loss anemia) - see normocytic anemia   Perinephric hematoma -Seen on CT abdomen/pelvis on 03/03/2020.  No impingement noted on kidney  HTN (hypertension) - Continue with nifedipine and labetalol as needed.  Intermittently hypertensive.  Uremia - see AKI  Asymptomatic bacteriuria-resolved as of 03/06/2020 -Not reporting symptoms -UA reviewed from 02/29/2020: Nitrite negative, moderate leukocyte esterase, no bacteria, WBC clumps, 11-20 RBCs - treated with CTX 2/26 - 3/2 and cefepime x 1 on 3/3  Acute on chronic respiratory failure with hypoxia (HCC)-resolved as of 03/06/2020 - s/p PEA arrest - extubated on 02/28/20; now on RA  Cardiac arrest with pulseless electrical activity (HCC)-resolved as of 03/06/2020 - s/p CPR and intubation   Ventilator dependence (HCC)-resolved as of 03/06/2020 - extubated 02/28/20   Old records reviewed in assessment of this patient  Antimicrobials: Rocephin 02/29/2020 -02/05/2020 Cefepime 03/05/2020 x 1 Vancomycin 03/05/2020 x 1  DVT prophylaxis: SCDs Start: 02/25/20 2009   Code Status:   Code Status: Full Code Family Communication: Son on phone while in room  Disposition Plan: Status is: Inpatient  Remains inpatient appropriate because:Ongoing diagnostic testing needed not appropriate for outpatient work up, IV treatments  appropriate due to intensity of illness or inability to take PO and Inpatient level of care appropriate due to severity of illness   Dispo:  Patient From: Home  Planned Disposition: Home with Health Care Svc  Medically stable for discharge: No    Risk of unplanned readmission score: Unplanned Admission- Pilot do not use: 20.78   Objective: Blood pressure (!) 155/94, pulse 86, temperature 98.9 F (37.2 C), temperature source Oral, resp. rate (!) 22, height 5' 2.99" (1.6 m), weight 75.1 kg, SpO2 97 %.  Examination: General appearance: alert, cooperative, no distress and weak and deconditioned in bed Head: Normocephalic, without obvious abnormality, atraumatic  Neck: Right neck noted with bandage in place from prior cath removed Eyes: EOMI Lungs: coarse sounds bilaterally Heart: Tachycardic, regular rhythm, S1-S2 present.  No appreciated murmurs Abdomen: Obese, soft, nontender, nondistended, bowel sounds present Extremities: 1-2+ LE edema B/L Skin: mobility and turgor normal Neurologic: Grossly normal  Consultants:   ID  Nephrology  Procedures:     Data Reviewed: I have personally reviewed following labs and imaging studies Results for orders placed or performed during the hospital encounter of 02/25/20 (from the past 24 hour(s))  Hemoglobin and hematocrit, blood     Status: Abnormal   Collection Time: 03/08/20 12:31 PM  Result Value Ref Range   Hemoglobin 10.3 (L) 12.0 - 15.0 g/dL   HCT 30.7 (L) 36.0 - 46.0 %  Occult blood card to lab,  stool RN will collect     Status: Abnormal   Collection Time: 03/08/20  4:11 PM  Result Value Ref Range   Fecal Occult Bld POSITIVE (A) NEGATIVE  Glucose, capillary     Status: None   Collection Time: 03/08/20  4:45 PM  Result Value Ref Range   Glucose-Capillary 95 70 - 99 mg/dL  Glucose, capillary     Status: Abnormal   Collection Time: 03/08/20 10:17 PM  Result Value Ref Range   Glucose-Capillary 108 (H) 70 - 99 mg/dL  CBC with  Differential/Platelet     Status: Abnormal   Collection Time: 03/09/20  1:50 AM  Result Value Ref Range   WBC 9.9 4.0 - 10.5 K/uL   RBC 2.96 (L) 3.87 - 5.11 MIL/uL   Hemoglobin 8.8 (L) 12.0 - 15.0 g/dL   HCT 26.4 (L) 36.0 - 46.0 %   MCV 89.2 80.0 - 100.0 fL   MCH 29.7 26.0 - 34.0 pg   MCHC 33.3 30.0 - 36.0 g/dL   RDW 14.6 11.5 - 15.5 %   Platelets 301 150 - 400 K/uL   nRBC 0.0 0.0 - 0.2 %   Neutrophils Relative % 78 %   Neutro Abs 7.7 1.7 - 7.7 K/uL   Lymphocytes Relative 8 %   Lymphs Abs 0.8 0.7 - 4.0 K/uL   Monocytes Relative 12 %   Monocytes Absolute 1.2 (H) 0.1 - 1.0 K/uL   Eosinophils Relative 1 %   Eosinophils Absolute 0.1 0.0 - 0.5 K/uL   Basophils Relative 0 %   Basophils Absolute 0.0 0.0 - 0.1 K/uL   Immature Granulocytes 1 %   Abs Immature Granulocytes 0.11 (H) 0.00 - 0.07 K/uL  Magnesium     Status: None   Collection Time: 03/09/20  1:50 AM  Result Value Ref Range   Magnesium 1.9 1.7 - 2.4 mg/dL  Renal function panel     Status: Abnormal   Collection Time: 03/09/20  1:50 AM  Result Value Ref Range   Sodium 131 (L) 135 - 145 mmol/L   Potassium 3.5 3.5 - 5.1 mmol/L   Chloride 96 (L) 98 - 111 mmol/L   CO2 22 22 - 32 mmol/L   Glucose, Bld 104 (H) 70 - 99 mg/dL   BUN 46 (H) 8 - 23 mg/dL   Creatinine, Ser 8.33 (H) 0.44 - 1.00 mg/dL   Calcium 7.5 (L) 8.9 - 10.3 mg/dL   Phosphorus 5.0 (H) 2.5 - 4.6 mg/dL   Albumin 1.7 (L) 3.5 - 5.0 g/dL   GFR, Estimated 5 (L) >60 mL/min   Anion gap 13 5 - 15  Glucose, capillary     Status: None   Collection Time: 03/09/20  8:14 AM  Result Value Ref Range   Glucose-Capillary 91 70 - 99 mg/dL    Recent Results (from the past 240 hour(s))  Urine Culture     Status: None   Collection Time: 03/04/20  5:30 AM   Specimen: Urine, Random  Result Value Ref Range Status   Specimen Description URINE, RANDOM  Final   Special Requests STERILE CUP  Final   Culture   Final    NO GROWTH Performed at [facility], [address] [place], [place] [address]    Report Status 03/05/2020 FINAL  Final  Culture, blood (routine x 2)     Status: None   Collection Time: 03/04/20  8:48 PM   Specimen: BLOOD  Result Value Ref Range Status  Specimen Description BLOOD LEFT ANTECUBITAL  Final   Special Requests   Final    BOTTLES DRAWN AEROBIC AND ANAEROBIC Blood Culture results may not be optimal due to an inadequate volume of blood received in culture bottles   Culture   Final    NO GROWTH 5 DAYS Performed at [facility], [place] [address] [place], [place] [address]    Report Status 03/09/2020 FINAL  Final  Culture, blood (routine x 2)     Status: None   Collection Time: 03/04/20  8:48 PM   Specimen: BLOOD LEFT HAND  Result Value Ref Range Status   Specimen Description BLOOD LEFT HAND  Final   Special Requests   Final    BOTTLES DRAWN AEROBIC AND ANAEROBIC Blood Culture results may not be optimal due to an inadequate volume of blood received in culture bottles   Culture   Final    NO GROWTH 5 DAYS Performed at [facility], [place] [address] [place], [place] [address]    Report Status 03/09/2020 FINAL  Final     Radiology Studies: VAS [place] LOWER EXTREMITY VENOUS (DVT)  Result Date: 03/07/2020  Lower Venous DVT Study Indications: Fever of unknown origin.  Comparison Study: No prior study Performing Technologist: Sharion Dove RVS  Examination Guidelines: A complete evaluation includes B-mode imaging, spectral Doppler, color Doppler, and power Doppler as needed of all accessible portions of each vessel. Bilateral testing is considered an integral part of a complete examination. Limited examinations for reoccurring indications may be performed as noted. The reflux portion of the exam is performed with the patient in reverse Trendelenburg.  +---------+---------------+---------+-----------+----------+--------------+ RIGHT    CompressibilityPhasicitySpontaneityPropertiesThrombus Aging  +---------+---------------+---------+-----------+----------+--------------+ CFV      Full           Yes      Yes                                 +---------+---------------+---------+-----------+----------+--------------+ SFJ      Full                                                        +---------+---------------+---------+-----------+----------+--------------+ FV Prox  Full                                                        +---------+---------------+---------+-----------+----------+--------------+ FV Mid   Full                                                        +---------+---------------+---------+-----------+----------+--------------+ FV DistalFull                                                        +---------+---------------+---------+-----------+----------+--------------+ PFV      Full                                                        +---------+---------------+---------+-----------+----------+--------------+  POP      Full           Yes      Yes                                 +---------+---------------+---------+-----------+----------+--------------+ PTV      Full                                                        +---------+---------------+---------+-----------+----------+--------------+ PERO     Full                                                        +---------+---------------+---------+-----------+----------+--------------+   +---------+---------------+---------+-----------+----------+--------------+ LEFT     CompressibilityPhasicitySpontaneityPropertiesThrombus Aging +---------+---------------+---------+-----------+----------+--------------+ CFV      Full           Yes      Yes                                 +---------+---------------+---------+-----------+----------+--------------+ SFJ      Full                                                         +---------+---------------+---------+-----------+----------+--------------+ FV Prox  Full                                                        +---------+---------------+---------+-----------+----------+--------------+ FV Mid   Full                                                        +---------+---------------+---------+-----------+----------+--------------+ FV DistalFull                                                        +---------+---------------+---------+-----------+----------+--------------+ PFV      Full                                                        +---------+---------------+---------+-----------+----------+--------------+ POP      Full           Yes      Yes                                 +---------+---------------+---------+-----------+----------+--------------+  PTV      Full                                                        +---------+---------------+---------+-----------+----------+--------------+ PERO     Full                                                        +---------+---------------+---------+-----------+----------+--------------+     Summary: BILATERAL: - No evidence of deep vein thrombosis seen in the lower extremities, bilaterally. -No evidence of popliteal cyst, bilaterally.   *See table(s) above for measurements and observations. Electronically signed by Jamelle Haring on 03/07/2020 at 5:50:28 PM.    Final    VAS [place] UPPER EXTREMITY VENOUS DUPLEX  Result Date: 03/07/2020 UPPER VENOUS STUDY  Indications: fever of unknown origin Limitations: Bandage at right neck. Comparison Study: No prior study Performing Technologist: Sharion Dove RVS  Examination Guidelines: A complete evaluation includes B-mode imaging, spectral Doppler, color Doppler, and power Doppler as needed of all accessible portions of each vessel. Bilateral testing is considered an integral part of a complete examination. Limited examinations for reoccurring  indications may be performed as noted.  Right Findings: +----------+------------+---------+-----------+----------+-------+ RIGHT     CompressiblePhasicitySpontaneousPropertiesSummary +----------+------------+---------+-----------+----------+-------+ IJV                      Yes       Yes                      +----------+------------+---------+-----------+----------+-------+ Subclavian               Yes       Yes                      +----------+------------+---------+-----------+----------+-------+ Axillary                 Yes       Yes                      +----------+------------+---------+-----------+----------+-------+ Brachial      Full       Yes       Yes                      +----------+------------+---------+-----------+----------+-------+ Radial        Full                                          +----------+------------+---------+-----------+----------+-------+ Ulnar         Full                                          +----------+------------+---------+-----------+----------+-------+ Cephalic      Full                                          +----------+------------+---------+-----------+----------+-------+  Basilic       Full                                          +----------+------------+---------+-----------+----------+-------+  Left Findings: +----------+------------+---------+-----------+----------+-------+ LEFT      CompressiblePhasicitySpontaneousPropertiesSummary +----------+------------+---------+-----------+----------+-------+ IJV           Full       Yes       Yes                      +----------+------------+---------+-----------+----------+-------+ Subclavian               Yes       Yes                      +----------+------------+---------+-----------+----------+-------+ Axillary                 Yes       Yes                      +----------+------------+---------+-----------+----------+-------+  Brachial      Full       Yes       Yes                      +----------+------------+---------+-----------+----------+-------+ Radial        Full                                          +----------+------------+---------+-----------+----------+-------+ Ulnar         Full                                          +----------+------------+---------+-----------+----------+-------+ Cephalic      Full                                          +----------+------------+---------+-----------+----------+-------+  Summary:  Right: No evidence of deep vein thrombosis in the upper extremity. No evidence of superficial vein thrombosis in the upper extremity.  Left: No evidence of deep vein thrombosis in the upper extremity. No evidence of superficial vein thrombosis in the upper extremity.  *See table(s) above for measurements and observations.  Diagnosing physician: Jamelle Haring Electronically signed by Jamelle Haring on 03/07/2020 at 5:50:10 PM.    Final    VAS [place] LOWER EXTREMITY VENOUS (DVT)  Final Result    VAS [place] UPPER EXTREMITY VENOUS DUPLEX  Final Result    CT CHEST WO CONTRAST  Final Result    CT ABDOMEN PELVIS WO CONTRAST  Final Result    US BIOPSY (KIDNEY)  Final Result    DG Chest 2 View  Final Result    DG CHEST PORT 1 VIEW  Final Result    MR BRAIN WO CONTRAST  Final Result    MR ANGIO HEAD WO CONTRAST  Final Result    CT HEAD WO CONTRAST  Final Result    DG Abd 1 View  Final Result    DG CHEST PORT 1 VIEW  Final Result  DG Chest Port 1 View  Final Result    IR Fluoro Guide CV Line Right  Final Result    IR US Guide Vasc Access Right  Final Result    US RENAL  Final Result    CT Renal Stone Study  Final Result    IR Fluoro Guide CV Line Right    (Results Pending)    Scheduled Meds: . sodium chloride   Intravenous Once  . chlorhexidine gluconate (MEDLINE KIT)  15 mL Mouth Rinse BID  . Chlorhexidine Gluconate Cloth  6 each Topical  Q0600  . Chlorhexidine Gluconate Cloth  6 each Topical Q0600  . dextromethorphan  30 mg Oral BID  . docusate sodium  100 mg Oral BID  . feeding supplement  237 mL Oral TID BM  . levETIRAcetam  250 mg Oral Q T,Th,Sa-HD  . levETIRAcetam  500 mg Oral BID  . mouth rinse  15 mL Mouth Rinse BID  . metoprolol tartrate  50 mg Oral BID  . multivitamin  1 tablet Oral QHS  . NIFEdipine  90 mg Oral Daily  . sodium chloride flush  10-40 mL Intracatheter Q12H   PRN Meds: sodium chloride, acetaminophen, antiseptic oral rinse, guaiFENesin, labetalol, LORazepam, melatonin, ondansetron **OR** ondansetron (ZOFRAN) IV, oxyCODONE, sodium chloride flush Continuous Infusions: . sodium chloride Stopped (02/28/20 1701)  . albumin human 25 g (03/09/20 0821)  . vancomycin       LOS: 13 days  Time spent: Greater than 50% of the 35 minute visit was spent in counseling/coordination of care for the patient as laid out in the A&P.   Dwyane Dee, MD [facility] 03/09/2020, 12:12 PM

## 2020-03-09 NOTE — Progress Notes (Signed)
Patient ID: Barbara Crawford, female   DOB: 06-21-1955, 65 y.o.   MRN: [id number] S: Patient states she feels about the same today.  She is nervous about her procedure. O:BP (!) 145/87 (BP Location: Right Arm)   Pulse 95   Temp 98.9 F (37.2 C) (Oral)   Resp 20   Ht 5' 2.99" (1.6 m)   Wt 75.1 kg   SpO2 96%   BMI 29.34 kg/m   Intake/Output Summary (Last 24 hours) at 03/09/2020 1021 Last data filed at 03/08/2020 1030 Gross per 24 hour  Intake 476.67 ml  Output -  Net 476.67 ml   Intake/Output: I/O last 3 completed shifts: In: 476.7 [Blood:476.7] Out: -   Intake/Output this shift:  No intake/output data recorded. Weight change:  Gen: NAD CVS: Normal rate Resp: Lateral chest rise Abd: +BS, soft, NT/ND Ext: 2+ pitting edema in the bilateral lower extremities  Recent Labs  Lab 03/03/20 0543 03/04/20 0044 03/05/20 0807 03/06/20 0237 03/07/20 0110 03/08/20 0046 03/09/20 0150  NA 135 135 132* 132* 132* 133* 131*  K 4.0 3.9 4.3 4.7 3.5 3.6 3.5  CL 98 99 98 96* 98 98 96*  CO2 _0 20* _1 GLUCOSE 87 125* 71 62* 85 89 104*  BUN 16 24* 22 35* 18 35* 46*  CREATININE 3.90* 5.35* 5.19* 6.36* 4.47* 6.48* 8.33*  ALBUMIN 1.3* 1.4* 1.3* 1.4*  --   --  1.7*  CALCIUM 8.3* 7.7* 7.2* 7.4* 7.1* 7.1* 7.5*  PHOS 3.0 3.7  --  4.9* 3.6 4.3 5.0*  AST  --   --  44*  --   --   --   --   ALT  --   --  35  --   --   --   --    Liver Function Tests: Recent Labs  Lab 03/05/20 0807 03/06/20 0237 03/09/20 0150  AST 44*  --   --   ALT 35  --   --   ALKPHOS 58  --   --   BILITOT 0.6  --   --   PROT 5.5*  --   --   ALBUMIN 1.3* 1.4* 1.7*   No results for input(s): LIPASE, AMYLASE in the last 168 hours. No results for input(s): AMMONIA in the last 168 hours. CBC: Recent Labs  Lab 03/05/20 0807 03/06/20 0237 03/07/20 0110 03/07/20 1522 03/08/20 0046 03/08/20 1231 03/09/20 0150  WBC 18.3* 18.0* 12.1*  --  11.8*  --  9.9  NEUTROABS  --   --  9.4*  --  9.0*  --  7.7  HGB  7.8* 7.8* 7.2*   < > 6.6* 10.3* 8.8*  HCT 22.9* 24.6* 21.2*   < > 20.0* 30.7* 26.4*  MCV 87.4 89.5 87.2  --  88.5  --  89.2  PLT 222 265 247  --  278  --  301   < > = values in this interval not displayed.   Cardiac Enzymes: No results for input(s): CKTOTAL, CKMB, CKMBINDEX, TROPONINI in the last 168 hours. CBG: Recent Labs  Lab 03/07/20 2234 03/08/20 0812 03/08/20 1645 03/08/20 2217 03/09/20 0814  GLUCAP 105* 83 95 108* 91    Iron Studies: No results for input(s): IRON, TIBC, TRANSFERRIN, FERRITIN in the last 72 hours. Studies/Results: VAS [place] LOWER EXTREMITY VENOUS (DVT)  Result Date: 03/07/2020  Lower Venous DVT Study Indications: Fever of unknown origin.  Comparison Study: No prior study Performing Technologist: Sharion Dove  RVS  Examination Guidelines: A complete evaluation includes B-mode imaging, spectral Doppler, color Doppler, and power Doppler as needed of all accessible portions of each vessel. Bilateral testing is considered an integral part of a complete examination. Limited examinations for reoccurring indications may be performed as noted. The reflux portion of the exam is performed with the patient in reverse Trendelenburg.  +---------+---------------+---------+-----------+----------+--------------+ RIGHT    CompressibilityPhasicitySpontaneityPropertiesThrombus Aging +---------+---------------+---------+-----------+----------+--------------+ CFV      Full           Yes      Yes                                 +---------+---------------+---------+-----------+----------+--------------+ SFJ      Full                                                        +---------+---------------+---------+-----------+----------+--------------+ FV Prox  Full                                                        +---------+---------------+---------+-----------+----------+--------------+ FV Mid   Full                                                         +---------+---------------+---------+-----------+----------+--------------+ FV DistalFull                                                        +---------+---------------+---------+-----------+----------+--------------+ PFV      Full                                                        +---------+---------------+---------+-----------+----------+--------------+ POP      Full           Yes      Yes                                 +---------+---------------+---------+-----------+----------+--------------+ PTV      Full                                                        +---------+---------------+---------+-----------+----------+--------------+ PERO     Full                                                        +---------+---------------+---------+-----------+----------+--------------+   +---------+---------------+---------+-----------+----------+--------------+  LEFT     CompressibilityPhasicitySpontaneityPropertiesThrombus Aging +---------+---------------+---------+-----------+----------+--------------+ CFV      Full           Yes      Yes                                 +---------+---------------+---------+-----------+----------+--------------+ SFJ      Full                                                        +---------+---------------+---------+-----------+----------+--------------+ FV Prox  Full                                                        +---------+---------------+---------+-----------+----------+--------------+ FV Mid   Full                                                        +---------+---------------+---------+-----------+----------+--------------+ FV DistalFull                                                        +---------+---------------+---------+-----------+----------+--------------+ PFV      Full                                                         +---------+---------------+---------+-----------+----------+--------------+ POP      Full           Yes      Yes                                 +---------+---------------+---------+-----------+----------+--------------+ PTV      Full                                                        +---------+---------------+---------+-----------+----------+--------------+ PERO     Full                                                        +---------+---------------+---------+-----------+----------+--------------+     Summary: BILATERAL: - No evidence of deep vein thrombosis seen in the lower extremities, bilaterally. -No evidence of popliteal cyst, bilaterally.   *See table(s) above for measurements and observations. Electronically signed by Jamelle Haring on 03/07/2020 at 5:50:28 PM.  Final    VAS [place] UPPER EXTREMITY VENOUS DUPLEX  Result Date: 03/07/2020 UPPER VENOUS STUDY  Indications: fever of unknown origin Limitations: Bandage at right neck. Comparison Study: No prior study Performing Technologist: Sharion Dove RVS  Examination Guidelines: A complete evaluation includes B-mode imaging, spectral Doppler, color Doppler, and power Doppler as needed of all accessible portions of each vessel. Bilateral testing is considered an integral part of a complete examination. Limited examinations for reoccurring indications may be performed as noted.  Right Findings: +----------+------------+---------+-----------+----------+-------+ RIGHT     CompressiblePhasicitySpontaneousPropertiesSummary +----------+------------+---------+-----------+----------+-------+ IJV                      Yes       Yes                      +----------+------------+---------+-----------+----------+-------+ Subclavian               Yes       Yes                      +----------+------------+---------+-----------+----------+-------+ Axillary                 Yes       Yes                       +----------+------------+---------+-----------+----------+-------+ Brachial      Full       Yes       Yes                      +----------+------------+---------+-----------+----------+-------+ Radial        Full                                          +----------+------------+---------+-----------+----------+-------+ Ulnar         Full                                          +----------+------------+---------+-----------+----------+-------+ Cephalic      Full                                          +----------+------------+---------+-----------+----------+-------+ Basilic       Full                                          +----------+------------+---------+-----------+----------+-------+  Left Findings: +----------+------------+---------+-----------+----------+-------+ LEFT      CompressiblePhasicitySpontaneousPropertiesSummary +----------+------------+---------+-----------+----------+-------+ IJV           Full       Yes       Yes                      +----------+------------+---------+-----------+----------+-------+ Subclavian               Yes       Yes                      +----------+------------+---------+-----------+----------+-------+ Axillary  Yes       Yes                      +----------+------------+---------+-----------+----------+-------+ Brachial      Full       Yes       Yes                      +----------+------------+---------+-----------+----------+-------+ Radial        Full                                          +----------+------------+---------+-----------+----------+-------+ Ulnar         Full                                          +----------+------------+---------+-----------+----------+-------+ Cephalic      Full                                          +----------+------------+---------+-----------+----------+-------+  Summary:  Right: No evidence of deep vein thrombosis in  the upper extremity. No evidence of superficial vein thrombosis in the upper extremity.  Left: No evidence of deep vein thrombosis in the upper extremity. No evidence of superficial vein thrombosis in the upper extremity.  *See table(s) above for measurements and observations.  Diagnosing physician: Jamelle Haring Electronically signed by Jamelle Haring on 03/07/2020 at 5:50:10 PM.    Final    . sodium chloride   Intravenous Once  . chlorhexidine gluconate (MEDLINE KIT)  15 mL Mouth Rinse BID  . Chlorhexidine Gluconate Cloth  6 each Topical Q0600  . Chlorhexidine Gluconate Cloth  6 each Topical Q0600  . dextromethorphan  30 mg Oral BID  . docusate sodium  100 mg Oral BID  . feeding supplement  237 mL Oral TID BM  . levETIRAcetam  250 mg Oral Q T,Th,Sa-HD  . levETIRAcetam  500 mg Oral BID  . mouth rinse  15 mL Mouth Rinse BID  . metoprolol tartrate  50 mg Oral BID  . multivitamin  1 tablet Oral QHS  . NIFEdipine  90 mg Oral Daily  . sodium chloride flush  10-40 mL Intracatheter Q12H    BMET    Component Value Date/Time   NA 131 (L) 03/09/2020 0150   K 3.5 03/09/2020 0150   CL 96 (L) 03/09/2020 0150   CO2 22 03/09/2020 0150   GLUCOSE 104 (H) 03/09/2020 0150   BUN 46 (H) 03/09/2020 0150   CREATININE 8.33 (H) 03/09/2020 0150   CALCIUM 7.5 (L) 03/09/2020 0150   CALCIUM 6.2 (LL) 02/26/2020 0305   GFRNONAA 5 (L) 03/09/2020 0150   GFRAA >60 01/28/2016 1543   CBC    Component Value Date/Time   WBC 9.9 03/09/2020 0150   RBC 2.96 (L) 03/09/2020 0150   HGB 8.8 (L) 03/09/2020 0150   HCT 26.4 (L) 03/09/2020 0150   PLT 301 03/09/2020 0150   MCV 89.2 03/09/2020 0150   MCH 29.7 03/09/2020 0150   MCHC 33.3 03/09/2020 0150   RDW 14.6 03/09/2020 0150   LYMPHSABS 0.8 03/09/2020 0150   MONOABS 1.2 (H) 03/09/2020 0150   EOSABS 0.1 03/09/2020 0150  BASOSABS 0.0 03/09/2020 0150    Assessment/Plan:  1. 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  Further studies pending.  Only serology positive was ANA. 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  Awaiting stains and EM but likely near ESRD 1. NPO for [facility] today w/ IR, appreciate help 2. Continue HD after [facility] placement; today or tomorrow 3. Consult VVS for access placement 4. Continue CLIP process; AKI for now until final biopsy return to make ESRD decision 2. PEA arrest after temp HD catheter placed. Resolved and extubated. 3. ID - pt with low grade fevers and ongoing tachycardia.  ID following.  HD cath removed for line holiday.  Off abx and fever curve trending down. 4. Hypertension: BP slightly high.  Continue to monitor 5. Anemia: Multifactorial.  Hemoglobin improved to 8.8 today.  Iron studies likely not accurate at this time given recent transfusion.  Consider Aranesp and obtaining iron studies in a week or 2. 6. Seizure activity - neurology consulted and felt to be related to metabolic derangements. On keppra per neuro. 7. Anxiety: Multifactorial related to acute illness and realization of her future regarding kidney disease.  Enlisted help of chaplain. Appreciate help

## 2020-03-09 NOTE — Progress Notes (Signed)
Gentry Fitz, RN called and notified the pt HD tx has been moved to 03/10/2020.

## 2020-03-10 ENCOUNTER — Inpatient Hospital Stay (HOSPITAL_COMMUNITY): Payer: 59

## 2020-03-10 DIAGNOSIS — J9621 Acute and chronic respiratory failure with hypoxia: Secondary | ICD-10-CM | POA: Diagnosis not present

## 2020-03-10 DIAGNOSIS — N186 End stage renal disease: Secondary | ICD-10-CM | POA: Diagnosis not present

## 2020-03-10 DIAGNOSIS — N043 Nephrotic syndrome with diffuse mesangial proliferative glomerulonephritis: Secondary | ICD-10-CM | POA: Insufficient documentation

## 2020-03-10 DIAGNOSIS — N179 Acute kidney failure, unspecified: Secondary | ICD-10-CM | POA: Diagnosis not present

## 2020-03-10 DIAGNOSIS — R8271 Bacteriuria: Secondary | ICD-10-CM | POA: Diagnosis not present

## 2020-03-10 DIAGNOSIS — Z452 Encounter for adjustment and management of vascular access device: Secondary | ICD-10-CM

## 2020-03-10 DIAGNOSIS — D62 Acute posthemorrhagic anemia: Secondary | ICD-10-CM | POA: Diagnosis not present

## 2020-03-10 LAB — BRUCELLA ANTIBODY IGG, EIA: Brucella Antibody IgG, EIA: NEGATIVE

## 2020-03-10 LAB — CBC WITH DIFFERENTIAL/PLATELET
Abs Immature Granulocytes: 0.14 10*3/uL — ABNORMAL HIGH (ref 0.00–0.07)
Basophils Absolute: 0.1 10*3/uL (ref 0.0–0.1)
Basophils Relative: 1 %
Eosinophils Absolute: 0.1 10*3/uL (ref 0.0–0.5)
Eosinophils Relative: 1 %
HCT: 26 % — ABNORMAL LOW (ref 36.0–46.0)
Hemoglobin: 8.7 g/dL — ABNORMAL LOW (ref 12.0–15.0)
Immature Granulocytes: 1 %
Lymphocytes Relative: 7 %
Lymphs Abs: 0.7 10*3/uL (ref 0.7–4.0)
MCH: 29.8 pg (ref 26.0–34.0)
MCHC: 33.5 g/dL (ref 30.0–36.0)
MCV: 89 fL (ref 80.0–100.0)
Monocytes Absolute: 1.1 10*3/uL — ABNORMAL HIGH (ref 0.1–1.0)
Monocytes Relative: 11 %
Neutro Abs: 8 10*3/uL — ABNORMAL HIGH (ref 1.7–7.7)
Neutrophils Relative %: 79 %
Platelets: 293 10*3/uL (ref 150–400)
RBC: 2.92 MIL/uL — ABNORMAL LOW (ref 3.87–5.11)
RDW: 14.9 % (ref 11.5–15.5)
WBC: 10.1 10*3/uL (ref 4.0–10.5)
nRBC: 0 % (ref 0.0–0.2)

## 2020-03-10 LAB — GLUCOSE, CAPILLARY
Glucose-Capillary: 73 mg/dL (ref 70–99)
Glucose-Capillary: 98 mg/dL (ref 70–99)
Glucose-Capillary: 105 mg/dL — ABNORMAL HIGH (ref 70–99)

## 2020-03-10 LAB — RENAL FUNCTION PANEL
Albumin: 1.8 g/dL — ABNORMAL LOW (ref 3.5–5.0)
Anion gap: 15 (ref 5–15)
BUN: 56 mg/dL — ABNORMAL HIGH (ref 8–23)
CO2: 18 mmol/L — ABNORMAL LOW (ref 22–32)
Calcium: 7.6 mg/dL — ABNORMAL LOW (ref 8.9–10.3)
Chloride: 98 mmol/L (ref 98–111)
Creatinine, Ser: 9.41 mg/dL — ABNORMAL HIGH (ref 0.44–1.00)
GFR, Estimated: 4 mL/min — ABNORMAL LOW (ref >60)
Glucose, Bld: 87 mg/dL (ref 70–99)
Phosphorus: 5.7 mg/dL — ABNORMAL HIGH (ref 2.5–4.6)
Potassium: 3.7 mmol/L (ref 3.5–5.1)
Sodium: 131 mmol/L — ABNORMAL LOW (ref 135–145)

## 2020-03-10 LAB — MAGNESIUM: Magnesium: 1.9 mg/dL (ref 1.7–2.4)

## 2020-03-10 MED ORDER — HEPARIN SODIUM (PORCINE) 1000 UNIT/ML IJ SOLN
INTRAMUSCULAR | Status: AC
  Filled 2020-03-10: qty 3

## 2020-03-10 NOTE — Progress Notes (Signed)
Patient ID: Barbara Crawford, female   DOB: January 16, 1955, 65 y.o.   MRN: [id number] S: Patient seen on dialysis today tolerating well.  No significant complaints today O:BP (!) 193/110   Pulse 90   Temp 98.5 F (36.9 C) (Oral)   Resp (!) 25   Ht 5' 2.99" (1.6 m)   Wt 75.8 kg   SpO2 96%   BMI 29.61 kg/m   Intake/Output Summary (Last 24 hours) at 03/10/2020 1051 Last data filed at 03/09/2020 1600 Gross per 24 hour  Intake 100 ml  Output -  Net 100 ml   Intake/Output: I/O last 3 completed shifts: In: 200 [IV Piggyback:200] Out: -   Intake/Output this shift:  No intake/output data recorded. Weight change:  Gen: NAD CVS: Normal rate Resp: Lateral chest rise Abd: +BS, soft, NT/ND Ext: 2+ pitting edema in the bilateral lower extremities  Recent Labs  Lab 03/04/20 0044 03/05/20 0807 03/06/20 0237 03/07/20 0110 03/08/20 0046 03/09/20 0150 03/10/20 0025  NA 135 132* 132* 132* 133* 131* 131*  K 3.9 4.3 4.7 3.5 3.6 3.5 3.7  CL 99 98 96* 98 98 96* 98  CO2 23 22 20* 25 24 22  18*  GLUCOSE 125* 71 62* 85 89 104* 87  BUN 24* 22 35* 18 35* 46* 56*  CREATININE 5.35* 5.19* 6.36* 4.47* 6.48* 8.33* 9.41*  ALBUMIN 1.4* 1.3* 1.4*  --   --  1.7* 1.8*  CALCIUM 7.7* 7.2* 7.4* 7.1* 7.1* 7.5* 7.6*  PHOS 3.7  --  4.9* 3.6 4.3 5.0* 5.7*  AST  --  44*  --   --   --   --   --   ALT  --  35  --   --   --   --   --    Liver Function Tests: Recent Labs  Lab 03/05/20 0807 03/06/20 0237 03/09/20 0150 03/10/20 0025  AST 44*  --   --   --   ALT 35  --   --   --   ALKPHOS 58  --   --   --   BILITOT 0.6  --   --   --   PROT 5.5*  --   --   --   ALBUMIN 1.3* 1.4* 1.7* 1.8*   No results for input(s): LIPASE, AMYLASE in the last 168 hours. No results for input(s): AMMONIA in the last 168 hours. CBC: Recent Labs  Lab 03/06/20 0237 03/06/20 0237 03/07/20 0110 03/07/20 1522 03/08/20 0046 03/08/20 1231 03/09/20 0150 03/10/20 0025  WBC 18.0*  --  12.1*  --  11.8*  --  9.9 10.1   NEUTROABS  --    < > 9.4*  --  9.0*  --  7.7 8.0*  HGB 7.8*  --  7.2*   < > 6.6* 10.3* 8.8* 8.7*  HCT 24.6*  --  21.2*   < > 20.0* 30.7* 26.4* 26.0*  MCV 89.5  --  87.2  --  88.5  --  89.2 89.0  PLT 265  --  247  --  278  --  301 293   < > = values in this interval not displayed.   Cardiac Enzymes: No results for input(s): CKTOTAL, CKMB, CKMBINDEX, TROPONINI in the last 168 hours. CBG: Recent Labs  Lab 03/08/20 1645 03/08/20 2217 03/09/20 0814 03/09/20 1205 03/09/20 1559  GLUCAP 95 108* 91 81 76    Iron Studies: No results for input(s): IRON, TIBC, TRANSFERRIN, FERRITIN in the last 72  hours. Studies/Results: IR Fluoro Guide CV Line Right  Result Date: 03/09/2020 INDICATION: End-stage renal disease, no current access for dialysis EXAM: ULTRASOUND GUIDANCE FOR VASCULAR ACCESS RIGHT INTERNAL JUGULAR PERMANENT HEMODIALYSIS CATHETER Date:  03/09/2020 03/09/2020 1:54 pm Radiologist:  Jerilynn Mages. Daryll Brod, MD Guidance:  Ultrasound and fluoroscopic FLUOROSCOPY TIME:  Fluoroscopy Time: 0 minutes 36 seconds (2 mGy). MEDICATIONS: 1 g vancomycin within 1 hour of the procedure ANESTHESIA/SEDATION: Versed 1.5 mg IV; Fentanyl 50 mcg IV; Moderate Sedation Time:  16 minutes The patient was continuously monitored during the procedure by the interventional radiology nurse under my direct supervision. CONTRAST:  None. COMPLICATIONS: None immediate. PROCEDURE: Informed consent was obtained from the patient following explanation of the procedure, risks, benefits and alternatives. The patient understands, agrees and consents for the procedure. All questions were addressed. A time out was performed. Maximal barrier sterile technique utilized including caps, mask, sterile gowns, sterile gloves, large sterile drape, hand hygiene, and 2% chlorhexidine scrub. Under sterile conditions and local anesthesia, right internal jugular micropuncture venous access was performed with ultrasound. Images were obtained for documentation of  the patent right internal jugular vein. A guide wire was inserted followed by a transitional dilator. Next, a 0.035 guidewire was advanced into the IVC with a 5-French catheter. Measurements were obtained from the right venotomy site to the proximal right atrium. In the right infraclavicular chest, a subcutaneous tunnel was created under sterile conditions and local anesthesia. 1% lidocaine with epinephrine was utilized for this. The 19 cm tip to cuff palindrome catheter was tunneled subcutaneously to the venotomy site and inserted into the SVC/RA junction through a valved peel-away sheath. Position was confirmed with fluoroscopy. Images were obtained for documentation. Blood was aspirated from the catheter followed by saline and heparin flushes. The appropriate volume and strength of heparin was instilled in each lumen. Caps were applied. The catheter was secured at the tunnel site with Gelfoam and a pursestring suture. The venotomy site was closed with subcuticular Vicryl suture. Dermabond was applied to the small right neck incision. A dry sterile dressing was applied. The catheter is ready for use. No immediate complications. IMPRESSION: Ultrasound and fluoroscopically guided right internal jugular tunneled hemodialysis catheter (19 cm tip to cuff palindrome catheter). Electronically Signed   By: Jerilynn Mages.  Shick M.D.   On: 03/09/2020 14:03   IR US Guide Vasc Access Right  Result Date: 03/09/2020 INDICATION: End-stage renal disease, no current access for dialysis EXAM: ULTRASOUND GUIDANCE FOR VASCULAR ACCESS RIGHT INTERNAL JUGULAR PERMANENT HEMODIALYSIS CATHETER Date:  03/09/2020 03/09/2020 1:54 pm Radiologist:  Jerilynn Mages. Daryll Brod, MD Guidance:  Ultrasound and fluoroscopic FLUOROSCOPY TIME:  Fluoroscopy Time: 0 minutes 36 seconds (2 mGy). MEDICATIONS: 1 g vancomycin within 1 hour of the procedure ANESTHESIA/SEDATION: Versed 1.5 mg IV; Fentanyl 50 mcg IV; Moderate Sedation Time:  16 minutes The patient was continuously  monitored during the procedure by the interventional radiology nurse under my direct supervision. CONTRAST:  None. COMPLICATIONS: None immediate. PROCEDURE: Informed consent was obtained from the patient following explanation of the procedure, risks, benefits and alternatives. The patient understands, agrees and consents for the procedure. All questions were addressed. A time out was performed. Maximal barrier sterile technique utilized including caps, mask, sterile gowns, sterile gloves, large sterile drape, hand hygiene, and 2% chlorhexidine scrub. Under sterile conditions and local anesthesia, right internal jugular micropuncture venous access was performed with ultrasound. Images were obtained for documentation of the patent right internal jugular vein. A guide wire was inserted followed by a transitional dilator.  Next, a 0.035 guidewire was advanced into the IVC with a 5-French catheter. Measurements were obtained from the right venotomy site to the proximal right atrium. In the right infraclavicular chest, a subcutaneous tunnel was created under sterile conditions and local anesthesia. 1% lidocaine with epinephrine was utilized for this. The 19 cm tip to cuff palindrome catheter was tunneled subcutaneously to the venotomy site and inserted into the SVC/RA junction through a valved peel-away sheath. Position was confirmed with fluoroscopy. Images were obtained for documentation. Blood was aspirated from the catheter followed by saline and heparin flushes. The appropriate volume and strength of heparin was instilled in each lumen. Caps were applied. The catheter was secured at the tunnel site with Gelfoam and a pursestring suture. The venotomy site was closed with subcuticular Vicryl suture. Dermabond was applied to the small right neck incision. A dry sterile dressing was applied. The catheter is ready for use. No immediate complications. IMPRESSION: Ultrasound and fluoroscopically guided right internal jugular  tunneled hemodialysis catheter (19 cm tip to cuff palindrome catheter). Electronically Signed   By: Jerilynn Mages.  Shick M.D.   On: 03/09/2020 14:03   . sodium chloride   Intravenous Once  . chlorhexidine gluconate (MEDLINE KIT)  15 mL Mouth Rinse BID  . Chlorhexidine Gluconate Cloth  6 each Topical Q0600  . Chlorhexidine Gluconate Cloth  6 each Topical Q0600  . dextromethorphan  30 mg Oral BID  . docusate sodium  100 mg Oral BID  . feeding supplement  237 mL Oral TID BM  . heparin sodium (porcine)      . levETIRAcetam  250 mg Oral Q T,Th,Sa-HD  . levETIRAcetam  500 mg Oral BID  . mouth rinse  15 mL Mouth Rinse BID  . metoprolol tartrate  50 mg Oral BID  . multivitamin  1 tablet Oral QHS  . NIFEdipine  90 mg Oral Daily  . sodium chloride flush  10-40 mL Intracatheter Q12H    BMET    Component Value Date/Time   NA 131 (L) 03/10/2020 0025   K 3.7 03/10/2020 0025   CL 98 03/10/2020 0025   CO2 18 (L) 03/10/2020 0025   GLUCOSE 87 03/10/2020 0025   BUN 56 (H) 03/10/2020 0025   CREATININE 9.41 (H) 03/10/2020 0025   CALCIUM 7.6 (L) 03/10/2020 0025   CALCIUM 6.2 (LL) 02/26/2020 0305   GFRNONAA 4 (L) 03/10/2020 0025   GFRAA >60 01/28/2016 1543   CBC    Component Value Date/Time   WBC 10.1 03/10/2020 0025   RBC 2.92 (L) 03/10/2020 0025   HGB 8.7 (L) 03/10/2020 0025   HCT 26.0 (L) 03/10/2020 0025   PLT 293 03/10/2020 0025   MCV 89.0 03/10/2020 0025   MCH 29.8 03/10/2020 0025   MCHC 33.5 03/10/2020 0025   RDW 14.9 03/10/2020 0025   LYMPHSABS 0.7 03/10/2020 0025   MONOABS 1.1 (H) 03/10/2020 0025   EOSABS 0.1 03/10/2020 0025   BASOSABS 0.1 03/10/2020 0025    Assessment/Plan:  1. 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 Underwent biopsy on 2/28 with preliminary FSGS with severe IFTA.  Final report returned with collapsing FSGS and severe IFTA and significant arterionephrosclerosis. She may have an ab mediated form of FSGS but given the severe IFTA there is no  role for treatment at this time as the risks of treatment outweigh the benefit. Given the biopsy results I think we can call her ESRD. 1. Continue HD per TTS schedule 2. Dr. Oneida Alar saw patient with plans for  access placement; appreciate help 3. Undergoing CLIP process 2. PEA arrest after temp HD catheter placed. Resolved and extubated. 3. ID - pt with low grade fevers and ongoing tachycardia.  ID following.  HD cath removed for line holiday.  Off abx and fever curve trending down. 4. Hypertension: BP slightly high.  Continue to monitor with dialysis today 5. Anemia: Multifactorial.  Hemoglobin improved to 8.7 today.  Iron studies likely not accurate at this time given recent transfusion.  Consider Aranesp and obtaining iron studies in a week or 2. 6. Seizure activity - neurology consulted and felt to be related to metabolic derangements. On keppra per neuro. 7. Anxiety: Multifactorial related to acute illness and realization of her future regarding kidney disease.  Enlisted help of chaplain. Appreciate help

## 2020-03-10 NOTE — Progress Notes (Signed)
PT Cancellation Note  Patient Details Name: Barbara Crawford MRN: [id number] DOB: Dec 30, 1955   Cancelled Treatment:    Reason Eval/Treat Not Completed: 1) pt in HD all morning, 2) pt now refusing due to fatigue. RN reports she has not been able to convince pt to do anything since returning from HD.    Arby Barrette, PT Pager [phone]    [name] 03/10/2020, 4:10 PM

## 2020-03-10 NOTE — Progress Notes (Signed)
Vein map still pending.  We will follow up with patient after vein mapping and decide on access date.  Ruta Hinds, MD [facility] Office: [phone]

## 2020-03-10 NOTE — Progress Notes (Signed)
OT Cancellation Note  Patient Details Name: Barbara Crawford MRN: [id number] DOB: 05-Nov-1955   Cancelled Treatment:    Reason Eval/Treat Not Completed: Fatigue/lethargy limiting ability to participate;Other (comment) Pt reports fatigue from HD, will check back as time allows for OT session.  [name], COTA/L Acute Rehabilitation Services [phone]   [name] 03/10/2020, 3:46 PM

## 2020-03-10 NOTE — Progress Notes (Signed)
Upper extremity vein mapping has been completed.   Preliminary results in CV Proc.   Abram Sander 03/10/2020 2:08 PM

## 2020-03-10 NOTE — Progress Notes (Addendum)
Renal Navigator met with patient at bedside to discuss outpatient HD referral. Patient was quiet, but pleasant and welcoming. She states understanding of need to continue with outpatient HD at discharge and agreeable to referral by Navigator. Navigator explained to patient that her insurance, [employer], has not been in network with [facility] in my experience and advised that she may want to call her insurance company to inquire about this. Navigator will still complete referral to [facility], as [facility] clinic is so much closer to her home than anywhere else, but prepared patient that she may need to start in a [facility], as in the past, [facility] has been the only outpatient HD clinic in network with [place] to Navigator's knowledge. Again, things may have changed over time, so Navigator will try [facility], but also plans to try other clinics in the surrounding area. Patient is tearful with this news.  She states she does not recall whether or not Dr. Joylene Grapes has come by to talk with her today yet or not. (Navigator is fairly certain he has per documentation time). She states is not aware of what her biopsy results are (AKI vs ESRD). Navigator explained that based on her dx (Navigator did not provide any information based on Nephrology documentation in her chart), that she may qualify for other benefits. If she has a dx of ESRD and has worked/paid taxes, she can apply for Medicare even though she is under 44. Navigator told her this is not immediate and thinks she will most likely have to start at a clinic that accepts her current insurance and transfer to a local clinic once she has an in network provider, for example. Navigator will explore as many options as possible, but prepared patient that an outpatient HD seat may take some time given the situation. Navigator will follow closely.   Alphonzo Cruise, [place] Renal Navigator [phone]

## 2020-03-10 NOTE — Plan of Care (Signed)
  Problem: Activity: Goal: Ability to tolerate increased activity will improve Outcome: Progressing   Problem: Respiratory: Goal: Ability to maintain a clear airway and adequate ventilation will improve Outcome: Progressing   Problem: Role Relationship: Goal: Method of communication will improve Outcome: Progressing   

## 2020-03-10 NOTE — Progress Notes (Signed)
PROGRESS NOTE    Barbara Crawford   OYD:[id number]  DOB: 02-Aug-1955  DOA: 02/25/2020     14  PCP: Vonna Drafts, FNP  Brief narrative/Hospital Course: Barbara Crawford is a 65 yo female with past medical history of hypertension, prior Covid (Dec 2021) presented to the hospital with complaints of lower extremity swelling, back pain, generalized malaise fatigue at home. In the ED, she was found to have a hemoglobin of 6.3, potassium 5.4, sodium 130, bicarb 9, BUN 144, and creatinine 29.98.  CT of the abdomen and pelvis was negative for obstruction or acute abnormality.  Temporary hemodialysis catheter was placed by IR and subsequently, the patient had a PEA arrest associated with seizure-like activity.  She was intubated and subsequently extubated 02/28/2020.   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 Renal biopsy done 03/03/2019.  ANA positive.  ID followed the patient for fever.Trialysis cath was removed on 03/06/20.   Assessment & Plan: Principal Problem:   Acute renal failure (HCC) Active Problems:   Normocytic anemia   Uremia   HTN (hypertension)   Perinephric hematoma   Seizure (HCC)   Lobar pneumonia (HCC)   Sepsis (HCC) versus SIRS due to autoimmune process   Fever   Glomerulonephritis   ABLA (acute blood loss anemia)   Pleural effusion   Encounter for orogastric (OG) tube placement   FSGS (focal segmental glomerulosclerosis)  Oliguric acute kidney injury now ESRD  Patient presented with elevation of BUN (144) and creat (29.98).  Renal ultrasound showed echogenic kidneys. CT renal stone study showed non-obstructive right nephrolithiasis. 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 ANA positive, no M spike detected on protein electrophoresis, ASO negative, C3 117, C4 35, ANCA negative,GBM antibody negative.RPR non-reactive.  dsDNA negative, SCL 70 Positive+ (1.9), SSA IgG > 8+, SSB normal,  Chromatin antibody positive.   Status post renal  biopsy 03/02/2020.  Developed a perinephric hematoma post biopsy.    Status post IR guided internal jugular tunneled hemodialysis catheter on 03/09/2020.  Pleural effusion Continue hemodialysis for volume management.  Patient does have a hypoalbuminemia as well.  Moderate sized pleural effusion on CT on 3/4; no obvious HF on echo (EF 50-55%).  Lasix and albumin as per nephrology.  Acute blood loss anemia Stable at this time.  Continue to monitor hemoglobin.  Acute glomerulonephritis Status post renal biopsy which shows a focal segmental glomerulosclerosis.  Nephrology on board.  Fever Afebrile currently.  ID on board.  Hemodialysis catheter removed on 03/06/2020.  No antibiotic currently.  Had been on antibiotic recently.  CT chest with effusion atelectasis.  Duplex ultrasound was negative for DVT.  Continue incentive spirometry.  Lobar pneumonia  Status post extubation 02/28/2020.  Procalcitonin was elevated.  Completed course of antibiotic.  CT chest on 03/06/2020 showed bilateral effusion and atelectasis  Seizure  Patient had seizure with lossof pulse and PEA arrest s/p CPR.  Was seen by neurology and the plan is to continue Keppra 500 mg twice a day  Perinephric hematoma -Seen on CT abdomen/pelvis on 03/03/2020.  Continue to monitor closely.  Essential hypertension Continue with nifedipine and labetalol as needed.  Intermittently hypertensive.  We will continue to monitor closely.  Uremia Can Barbara Crawford to AKI.  Continue hemodialysis.  Normocytic anemia Status post kidney biopsy and perinephric hematoma.  Status post 2 units of packed RBC on 03/03/2020.  Continue Aranesp as per nephrology.  Recent hemoglobin of 8.7  Asymptomatic bacteriuria Received ceftriaxone.  UA reviewed from  02/29/2020: Nitrite negative, moderate leukocyte esterase, no bacteria, WBC clumps, 11-20 RBCs  Acute on chronic respiratory failure with hypoxia  resolved  03/06/2020.  Status post PEA arrest.  Extubated on 02/28/2020.   Now on RA.  Cardiac arrest with pulseless electrical activity  Status post CPR and intubation.  Stable at this time.  Ventilator dependence-patient was extubated on 02/28/2020.   Debility, deconditioning, patient was able to move a few steps to bedside commode.  Antimicrobials: None at this time.  DVT prophylaxis: SCDs Start: 02/25/20 2009      Code Status: Full Code   Family Communication:  None today  Disposition Plan: Status is: Inpatient  Remains inpatient appropriate because:Ongoing diagnostic testing needed not appropriate for outpatient work up, IV treatments appropriate due to intensity of illness or inability to take PO and Inpatient level of care appropriate due to severity of illness  Dispo:  Patient From: Home  Planned Disposition: Home with Health Care Svc   Anticipated date of discharge.  2 to 3 days  Medically stable for discharge: No  Subjective Today, patient was seen and examined at bedside.  Complains of mild cough.  No chest pain or shortness of breath or leg swelling.  Objective:  Vitals with BMI 03/10/2020 03/10/2020 03/10/2020  Height - - -  Weight - - -  BMI - - -  Systolic 299 242 683  Diastolic 419 622 297  Pulse 113 - -    Physical Examination: General:  not in obvious distress, deconditioned, obese HENT:   No scleral pallor or icterus noted. Oral mucosa is moist.  Chest:   Diminished breath sounds bilaterally.  Right chest wall permacath in place CVS: S1 &S2 heard. No murmur.  Regular rate and rhythm. Abdomen: Soft, nontender, nondistended.  Bowel sounds are heard.   Extremities: No cyanosis, clubbing with bilateral lower extremity edema, peripheral pulses are palpable. Psych: Alert, awake and oriented, normal mood CNS:  No cranial nerve deficits.  Power equal in all extremities.   Skin: Warm and dry.  No rashes noted.  Consultants:   ID  Nephrology  Procedures:   Hemodialysis Right chest wall IJ tunneled cath placement  Data  Reviewed: I have personally reviewed following labs and imaging studies  Results for orders placed or performed during the hospital encounter of 02/25/20 (from the past 24 hour(s))  Glucose, capillary     Status: None   Collection Time: 03/09/20  3:59 PM  Result Value Ref Range   Glucose-Capillary 76 70 - 99 mg/dL  CBC with Differential/Platelet     Status: Abnormal   Collection Time: 03/10/20 12:25 AM  Result Value Ref Range   WBC 10.1 4.0 - 10.5 K/uL   RBC 2.92 (L) 3.87 - 5.11 MIL/uL   Hemoglobin 8.7 (L) 12.0 - 15.0 g/dL   HCT 26.0 (L) 36.0 - 46.0 %   MCV 89.0 80.0 - 100.0 fL   MCH 29.8 26.0 - 34.0 pg   MCHC 33.5 30.0 - 36.0 g/dL   RDW 14.9 11.5 - 15.5 %   Platelets 293 150 - 400 K/uL   nRBC 0.0 0.0 - 0.2 %   Neutrophils Relative % 79 %   Neutro Abs 8.0 (H) 1.7 - 7.7 K/uL   Lymphocytes Relative 7 %   Lymphs Abs 0.7 0.7 - 4.0 K/uL   Monocytes Relative 11 %   Monocytes Absolute 1.1 (H) 0.1 - 1.0 K/uL   Eosinophils Relative 1 %   Eosinophils Absolute 0.1 0.0 - 0.5 K/uL  Basophils Relative 1 %   Basophils Absolute 0.1 0.0 - 0.1 K/uL   Immature Granulocytes 1 %   Abs Immature Granulocytes 0.14 (H) 0.00 - 0.07 K/uL  Magnesium     Status: None   Collection Time: 03/10/20 12:25 AM  Result Value Ref Range   Magnesium 1.9 1.7 - 2.4 mg/dL  Renal function panel     Status: Abnormal   Collection Time: 03/10/20 12:25 AM  Result Value Ref Range   Sodium 131 (L) 135 - 145 mmol/L   Potassium 3.7 3.5 - 5.1 mmol/L   Chloride 98 98 - 111 mmol/L   CO2 18 (L) 22 - 32 mmol/L   Glucose, Bld 87 70 - 99 mg/dL   BUN 56 (H) 8 - 23 mg/dL   Creatinine, Ser 9.41 (H) 0.44 - 1.00 mg/dL   Calcium 7.6 (L) 8.9 - 10.3 mg/dL   Phosphorus 5.7 (H) 2.5 - 4.6 mg/dL   Albumin 1.8 (L) 3.5 - 5.0 g/dL   GFR, Estimated 4 (L) >60 mL/min   Anion gap 15 5 - 15  Glucose, capillary     Status: None   Collection Time: 03/10/20 11:22 AM  Result Value Ref Range   Glucose-Capillary 73 70 - 99 mg/dL    Recent  Results (from the past 240 hour(s))  Urine Culture     Status: None   Collection Time: 03/04/20  5:30 AM   Specimen: Urine, Random  Result Value Ref Range Status   Specimen Description URINE, RANDOM  Final   Special Requests STERILE CUP  Final   Culture   Final    NO GROWTH Performed at [facility], [address], [place], [place] [address]    Report Status 03/05/2020 FINAL  Final  Culture, blood (routine x 2)     Status: None   Collection Time: 03/04/20  8:48 PM   Specimen: BLOOD  Result Value Ref Range Status   Specimen Description BLOOD LEFT ANTECUBITAL  Final   Special Requests   Final    BOTTLES DRAWN AEROBIC AND ANAEROBIC Blood Culture results may not be optimal due to an inadequate volume of blood received in culture bottles   Culture   Final    NO GROWTH 5 DAYS Performed at [facility], [place] [address], [place], [place] [address]    Report Status 03/09/2020 FINAL  Final  Culture, blood (routine x 2)     Status: None   Collection Time: 03/04/20  8:48 PM   Specimen: BLOOD LEFT HAND  Result Value Ref Range Status   Specimen Description BLOOD LEFT HAND  Final   Special Requests   Final    BOTTLES DRAWN AEROBIC AND ANAEROBIC Blood Culture results may not be optimal due to an inadequate volume of blood received in culture bottles   Culture   Final    NO GROWTH 5 DAYS Performed at [facility], [place] [address], [place], [place] [address]    Report Status 03/09/2020 FINAL  Final     Radiology Studies: IR Fluoro Guide CV Line Right  Result Date: 03/09/2020 INDICATION: End-stage renal disease, no current access for dialysis EXAM: ULTRASOUND GUIDANCE FOR VASCULAR ACCESS RIGHT INTERNAL JUGULAR PERMANENT HEMODIALYSIS CATHETER Date:  03/09/2020 03/09/2020 1:54 pm Radiologist:  Jerilynn Mages. Daryll Brod, MD Guidance:  Ultrasound and fluoroscopic FLUOROSCOPY TIME:  Fluoroscopy Time: 0 minutes 36 seconds (2 mGy). MEDICATIONS: 1 g vancomycin within 1 hour of the procedure  ANESTHESIA/SEDATION: Versed 1.5 mg IV; Fentanyl  50 mcg IV; Moderate Sedation Time:  16 minutes The patient was continuously monitored during the procedure by the interventional radiology nurse under my direct supervision. CONTRAST:  None. COMPLICATIONS: None immediate. PROCEDURE: Informed consent was obtained from the patient following explanation of the procedure, risks, benefits and alternatives. The patient understands, agrees and consents for the procedure. All questions were addressed. A time out was performed. Maximal barrier sterile technique utilized including caps, mask, sterile gowns, sterile gloves, large sterile drape, hand hygiene, and 2% chlorhexidine scrub. Under sterile conditions and local anesthesia, right internal jugular micropuncture venous access was performed with ultrasound. Images were obtained for documentation of the patent right internal jugular vein. A guide wire was inserted followed by a transitional dilator. Next, a 0.035 guidewire was advanced into the IVC with a 5-French catheter. Measurements were obtained from the right venotomy site to the proximal right atrium. In the right infraclavicular chest, a subcutaneous tunnel was created under sterile conditions and local anesthesia. 1% lidocaine with epinephrine was utilized for this. The 19 cm tip to cuff palindrome catheter was tunneled subcutaneously to the venotomy site and inserted into the SVC/RA junction through a valved peel-away sheath. Position was confirmed with fluoroscopy. Images were obtained for documentation. Blood was aspirated from the catheter followed by saline and heparin flushes. The appropriate volume and strength of heparin was instilled in each lumen. Caps were applied. The catheter was secured at the tunnel site with Gelfoam and a pursestring suture. The venotomy site was closed with subcuticular Vicryl suture. Dermabond was applied to the small right neck incision. A dry sterile dressing was applied. The  catheter is ready for use. No immediate complications. IMPRESSION: Ultrasound and fluoroscopically guided right internal jugular tunneled hemodialysis catheter (19 cm tip to cuff palindrome catheter). Electronically Signed   By: Jerilynn Mages.  Shick M.D.   On: 03/09/2020 14:03   IR US Guide Vasc Access Right  Result Date: 03/09/2020 INDICATION: End-stage renal disease, no current access for dialysis EXAM: ULTRASOUND GUIDANCE FOR VASCULAR ACCESS RIGHT INTERNAL JUGULAR PERMANENT HEMODIALYSIS CATHETER Date:  03/09/2020 03/09/2020 1:54 pm Radiologist:  Jerilynn Mages. Daryll Brod, MD Guidance:  Ultrasound and fluoroscopic FLUOROSCOPY TIME:  Fluoroscopy Time: 0 minutes 36 seconds (2 mGy). MEDICATIONS: 1 g vancomycin within 1 hour of the procedure ANESTHESIA/SEDATION: Versed 1.5 mg IV; Fentanyl 50 mcg IV; Moderate Sedation Time:  16 minutes The patient was continuously monitored during the procedure by the interventional radiology nurse under my direct supervision. CONTRAST:  None. COMPLICATIONS: None immediate. PROCEDURE: Informed consent was obtained from the patient following explanation of the procedure, risks, benefits and alternatives. The patient understands, agrees and consents for the procedure. All questions were addressed. A time out was performed. Maximal barrier sterile technique utilized including caps, mask, sterile gowns, sterile gloves, large sterile drape, hand hygiene, and 2% chlorhexidine scrub. Under sterile conditions and local anesthesia, right internal jugular micropuncture venous access was performed with ultrasound. Images were obtained for documentation of the patent right internal jugular vein. A guide wire was inserted followed by a transitional dilator. Next, a 0.035 guidewire was advanced into the IVC with a 5-French catheter. Measurements were obtained from the right venotomy site to the proximal right atrium. In the right infraclavicular chest, a subcutaneous tunnel was created under sterile conditions and local  anesthesia. 1% lidocaine with epinephrine was utilized for this. The 19 cm tip to cuff palindrome catheter was tunneled subcutaneously to the venotomy site and inserted into the SVC/RA junction through a valved peel-away sheath. Position  was confirmed with fluoroscopy. Images were obtained for documentation. Blood was aspirated from the catheter followed by saline and heparin flushes. The appropriate volume and strength of heparin was instilled in each lumen. Caps were applied. The catheter was secured at the tunnel site with Gelfoam and a pursestring suture. The venotomy site was closed with subcuticular Vicryl suture. Dermabond was applied to the small right neck incision. A dry sterile dressing was applied. The catheter is ready for use. No immediate complications. IMPRESSION: Ultrasound and fluoroscopically guided right internal jugular tunneled hemodialysis catheter (19 cm tip to cuff palindrome catheter). Electronically Signed   By: Jerilynn Mages.  Shick M.D.   On: 03/09/2020 14:03   VAS [place] UPPER EXT VEIN MAPPING (PRE-OP AVF)  Result Date: 03/10/2020 UPPER EXTREMITY VEIN MAPPING  Indications: Pre-access. Comparison Study: no prior Performing Technologist: Abram Sander RVS  Examination Guidelines: A complete evaluation includes B-mode imaging, spectral Doppler, color Doppler, and power Doppler as needed of all accessible portions of each vessel. Bilateral testing is considered an integral part of a complete examination. Limited examinations for reoccurring indications may be performed as noted. +-----------------+-------------+----------+--------------+ Right Cephalic   Diameter (cm)Depth (cm)   Findings    +-----------------+-------------+----------+--------------+ Shoulder             0.22        0.93                  +-----------------+-------------+----------+--------------+ Prox upper arm       0.19        0.57                  +-----------------+-------------+----------+--------------+ Mid upper  arm        0.18        0.63                  +-----------------+-------------+----------+--------------+ Dist upper arm       0.28        0.52                  +-----------------+-------------+----------+--------------+ Antecubital fossa    0.27        0.34                  +-----------------+-------------+----------+--------------+ Prox forearm         0.25        0.47     branching    +-----------------+-------------+----------+--------------+ Mid forearm                             not visualized +-----------------+-------------+----------+--------------+ Dist forearm                            not visualized +-----------------+-------------+----------+--------------+ Wrist                                   not visualized +-----------------+-------------+----------+--------------+ +-----------------+-------------+----------+---------+ Right Basilic    Diameter (cm)Depth (cm)Findings  +-----------------+-------------+----------+---------+ Prox upper arm       0.30        1.04             +-----------------+-------------+----------+---------+ Mid upper arm        0.26        1.35             +-----------------+-------------+----------+---------+ Dist upper arm  0.28        1.28             +-----------------+-------------+----------+---------+ Antecubital fossa    0.20        0.74   branching +-----------------+-------------+----------+---------+ Prox forearm         0.13        0.24             +-----------------+-------------+----------+---------+ Mid forearm          0.12        0.22             +-----------------+-------------+----------+---------+ Distal forearm       0.13        0.27             +-----------------+-------------+----------+---------+ +-----------------+-------------+----------+---------+ Left Cephalic    Diameter (cm)Depth (cm)Findings  +-----------------+-------------+----------+---------+ Shoulder              0.21        0.80             +-----------------+-------------+----------+---------+ Prox upper arm       0.21        0.66             +-----------------+-------------+----------+---------+ Mid upper arm        0.24        0.58             +-----------------+-------------+----------+---------+ Dist upper arm       0.23        0.51             +-----------------+-------------+----------+---------+ Antecubital fossa    0.40        0.21   branching +-----------------+-------------+----------+---------+ Prox forearm         0.22        0.34             +-----------------+-------------+----------+---------+ Mid forearm          0.27        0.46             +-----------------+-------------+----------+---------+ Dist forearm         0.25        0.47             +-----------------+-------------+----------+---------+ Wrist                0.23        0.29             +-----------------+-------------+----------+---------+ +-----------------+-------------+----------+--------------+ Left Basilic     Diameter (cm)Depth (cm)   Findings    +-----------------+-------------+----------+--------------+ Prox upper arm       0.24        1.03                  +-----------------+-------------+----------+--------------+ Mid upper arm        0.22        1.75                  +-----------------+-------------+----------+--------------+ Dist upper arm       0.20        1.37                  +-----------------+-------------+----------+--------------+ Antecubital fossa    0.22        0.83                  +-----------------+-------------+----------+--------------+ Prox forearm         0.16  0.60     branching    +-----------------+-------------+----------+--------------+ Mid forearm                             not visualized +-----------------+-------------+----------+--------------+ Distal forearm                          not visualized  +-----------------+-------------+----------+--------------+ Elbow                                   not visualized +-----------------+-------------+----------+--------------+ *See table(s) above for measurements and observations.   Diagnosing physician:    Preliminary    VAS [place] UPPER EXT VEIN MAPPING (PRE-OP AVF)      IR Fluoro Guide CV Line Right  Final Result    IR US Guide Vasc Access Right  Final Result    VAS [place] LOWER EXTREMITY VENOUS (DVT)  Final Result    VAS [place] UPPER EXTREMITY VENOUS DUPLEX  Final Result    CT CHEST WO CONTRAST  Final Result    CT ABDOMEN PELVIS WO CONTRAST  Final Result    US BIOPSY (KIDNEY)  Final Result    DG Chest 2 View  Final Result    DG CHEST PORT 1 VIEW  Final Result    MR BRAIN WO CONTRAST  Final Result    MR ANGIO HEAD WO CONTRAST  Final Result    CT HEAD WO CONTRAST  Final Result    DG Abd 1 View  Final Result    DG CHEST PORT 1 VIEW  Final Result    DG Chest Port 1 View  Final Result    IR Fluoro Guide CV Line Right  Final Result    IR US Guide Vasc Access Right  Final Result    US RENAL  Final Result    CT Renal Stone Study  Final Result      Scheduled Meds: . sodium chloride   Intravenous Once  . chlorhexidine gluconate (MEDLINE KIT)  15 mL Mouth Rinse BID  . Chlorhexidine Gluconate Cloth  6 each Topical Q0600  . Chlorhexidine Gluconate Cloth  6 each Topical Q0600  . dextromethorphan  30 mg Oral BID  . docusate sodium  100 mg Oral BID  . feeding supplement  237 mL Oral TID BM  . levETIRAcetam  250 mg Oral Q T,Th,Sa-HD  . levETIRAcetam  500 mg Oral BID  . mouth rinse  15 mL Mouth Rinse BID  . metoprolol tartrate  50 mg Oral BID  . multivitamin  1 tablet Oral QHS  . NIFEdipine  90 mg Oral Daily  . sodium chloride flush  10-40 mL Intracatheter Q12H   PRN Meds: sodium chloride, acetaminophen, antiseptic oral rinse, guaiFENesin, labetalol, LORazepam, melatonin, ondansetron **OR** ondansetron  (ZOFRAN) IV, oxyCODONE, sodium chloride flush Continuous Infusions: . sodium chloride Stopped (02/28/20 1701)     LOS: 14 days    Flora Lipps, MD [facility] 03/10/2020, 2:18 PM

## 2020-03-11 ENCOUNTER — Encounter (HOSPITAL_COMMUNITY): Payer: Self-pay | Admitting: Nephrology

## 2020-03-11 DIAGNOSIS — R8271 Bacteriuria: Secondary | ICD-10-CM | POA: Diagnosis not present

## 2020-03-11 DIAGNOSIS — N179 Acute kidney failure, unspecified: Secondary | ICD-10-CM | POA: Diagnosis not present

## 2020-03-11 DIAGNOSIS — J9621 Acute and chronic respiratory failure with hypoxia: Secondary | ICD-10-CM | POA: Diagnosis not present

## 2020-03-11 DIAGNOSIS — D62 Acute posthemorrhagic anemia: Secondary | ICD-10-CM | POA: Diagnosis not present

## 2020-03-11 LAB — RENAL FUNCTION PANEL
Albumin: 2.1 g/dL — ABNORMAL LOW (ref 3.5–5.0)
Anion gap: 11 (ref 5–15)
BUN: 32 mg/dL — ABNORMAL HIGH (ref 8–23)
CO2: 23 mmol/L (ref 22–32)
Calcium: 7.7 mg/dL — ABNORMAL LOW (ref 8.9–10.3)
Chloride: 99 mmol/L (ref 98–111)
Creatinine, Ser: 6.59 mg/dL — ABNORMAL HIGH (ref 0.44–1.00)
GFR, Estimated: 7 mL/min — ABNORMAL LOW (ref >60)
Glucose, Bld: 93 mg/dL (ref 70–99)
Phosphorus: 3.8 mg/dL (ref 2.5–4.6)
Potassium: 3.5 mmol/L (ref 3.5–5.1)
Sodium: 133 mmol/L — ABNORMAL LOW (ref 135–145)

## 2020-03-11 LAB — CBC WITH DIFFERENTIAL/PLATELET
Abs Immature Granulocytes: 0.13 10*3/uL — ABNORMAL HIGH (ref 0.00–0.07)
Basophils Absolute: 0.1 10*3/uL (ref 0.0–0.1)
Basophils Relative: 1 %
Eosinophils Absolute: 0.1 10*3/uL (ref 0.0–0.5)
Eosinophils Relative: 1 %
HCT: 24.5 % — ABNORMAL LOW (ref 36.0–46.0)
Hemoglobin: 8.4 g/dL — ABNORMAL LOW (ref 12.0–15.0)
Immature Granulocytes: 1 %
Lymphocytes Relative: 10 %
Lymphs Abs: 0.9 10*3/uL (ref 0.7–4.0)
MCH: 30.2 pg (ref 26.0–34.0)
MCHC: 34.3 g/dL (ref 30.0–36.0)
MCV: 88.1 fL (ref 80.0–100.0)
Monocytes Absolute: 1.4 10*3/uL — ABNORMAL HIGH (ref 0.1–1.0)
Monocytes Relative: 15 %
Neutro Abs: 6.7 10*3/uL (ref 1.7–7.7)
Neutrophils Relative %: 72 %
Platelets: 232 10*3/uL (ref 150–400)
RBC: 2.78 MIL/uL — ABNORMAL LOW (ref 3.87–5.11)
RDW: 14.9 % (ref 11.5–15.5)
WBC: 9.2 10*3/uL (ref 4.0–10.5)
nRBC: 0 % (ref 0.0–0.2)

## 2020-03-11 LAB — GLUCOSE, CAPILLARY
Glucose-Capillary: 90 mg/dL (ref 70–99)
Glucose-Capillary: 82 mg/dL (ref 70–99)
Glucose-Capillary: 93 mg/dL (ref 70–99)
Glucose-Capillary: 115 mg/dL — ABNORMAL HIGH (ref 70–99)

## 2020-03-11 LAB — SURGICAL PATHOLOGY

## 2020-03-11 LAB — MAGNESIUM: Magnesium: 1.7 mg/dL (ref 1.7–2.4)

## 2020-03-11 NOTE — Progress Notes (Signed)
Nutrition Education Note  RD consulted for Renal Education. Provided Renal Food Pyramid as well as handouts on protein, phosphorus, potassium and sodium from [facility]al [facility] to patient. Reviewed food groups and provided written recommended serving sizes specifically determined for patient's current nutritional status.   Explained why diet restrictions are needed and provided lists of foods to limit/avoid that are high potassium, sodium, and phosphorus. Provided specific recommendations on safer alternatives of these foods. Strongly encouraged compliance of this diet.   Discussed importance of protein intake at each meal and snack. Provided examples of how to maximize protein intake throughout the day. Discussed need for fluid restriction with dialysis, importance of minimizing weight gain between HD treatments, and renal-friendly beverage options. Reviewed importance of taking Renal MVI  Encouraged pt to discuss specific diet questions/concerns with RD at HD outpatient facility. Teach back method used.  Expect good compliance. Pt will receive further education at outpatient facility. RD happy to return and further discuss diet with pt prior to discharge   Kerman Passey MS, RDN, LDN, CNSC Registered Dietitian III Clinical Nutrition RD Pager and On-Call Pager Number Located in [place]

## 2020-03-11 NOTE — Progress Notes (Signed)
NoPatient ID: Barbara Crawford, female   DOB: 20-Aug-1955, 65 y.o.   MRN: [id number] S:.  Discussed her biopsy results and that she will likely require dialysis long-term.  Tolerated dialysis yesterday with no issues. O:BP (!) 147/95 (BP Location: Right Arm)   Pulse (!) 108   Temp 98.8 F (37.1 C) (Oral)   Resp 20   Ht 5' 2.99" (1.6 m)   Wt 75.8 kg   SpO2 99%   BMI 29.61 kg/m  No intake or output data in the 24 hours ending 03/11/20 1122 Intake/Output: I/O last 3 completed shifts: In: -  Out: 1923 [Other:1923]  Intake/Output this shift:  No intake/output data recorded. Weight change:  Gen: NAD CVS: Normal rate Resp: Lateral chest rise Abd: +BS, soft, NT/ND Ext: 2+ pitting edema in the bilateral lower extremities  Recent Labs  Lab 03/05/20 0807 03/06/20 0237 03/07/20 0110 03/08/20 0046 03/09/20 0150 03/10/20 0025 03/11/20 0206  NA 132* 132* 132* 133* 131* 131* 133*  K 4.3 4.7 3.5 3.6 3.5 3.7 3.5  CL 98 96* 98 98 96* 98 99  CO2 22 20* 25 24 22  18* 23  GLUCOSE 71 62* 85 89 104* 87 93  BUN 22 35* 18 35* 46* 56* 32*  CREATININE 5.19* 6.36* 4.47* 6.48* 8.33* 9.41* 6.59*  ALBUMIN 1.3* 1.4*  --   --  1.7* 1.8* 2.1*  CALCIUM 7.2* 7.4* 7.1* 7.1* 7.5* 7.6* 7.7*  PHOS  --  4.9* 3.6 4.3 5.0* 5.7* 3.8  AST 44*  --   --   --   --   --   --   ALT 35  --   --   --   --   --   --    Liver Function Tests: Recent Labs  Lab 03/05/20 0807 03/06/20 0237 03/09/20 0150 03/10/20 0025 03/11/20 0206  AST 44*  --   --   --   --   ALT 35  --   --   --   --   ALKPHOS 58  --   --   --   --   BILITOT 0.6  --   --   --   --   PROT 5.5*  --   --   --   --   ALBUMIN 1.3*   < > 1.7* 1.8* 2.1*   < > = values in this interval not displayed.   No results for input(s): LIPASE, AMYLASE in the last 168 hours. No results for input(s): AMMONIA in the last 168 hours. CBC: Recent Labs  Lab 03/07/20 0110 03/07/20 1522 03/08/20 0046 03/08/20 1231 03/09/20 0150 03/10/20 0025 03/11/20 0206   WBC 12.1*  --  11.8*  --  9.9 10.1 9.2  NEUTROABS 9.4*  --  9.0*  --  7.7 8.0* 6.7  HGB 7.2*   < > 6.6*   < > 8.8* 8.7* 8.4*  HCT 21.2*   < > 20.0*   < > 26.4* 26.0* 24.5*  MCV 87.2  --  88.5  --  89.2 89.0 88.1  PLT 247  --  278  --  301 293 232   < > = values in this interval not displayed.   Cardiac Enzymes: No results for input(s): CKTOTAL, CKMB, CKMBINDEX, TROPONINI in the last 168 hours. CBG: Recent Labs  Lab 03/09/20 1559 03/10/20 1122 03/10/20 1618 03/10/20 2031 03/11/20 0805  GLUCAP 76 73 98 105* 90    Iron Studies: No results for input(s): IRON, TIBC,  TRANSFERRIN, FERRITIN in the last 72 hours. Studies/Results: IR Fluoro Guide CV Line Right  Result Date: 03/09/2020 INDICATION: End-stage renal disease, no current access for dialysis EXAM: ULTRASOUND GUIDANCE FOR VASCULAR ACCESS RIGHT INTERNAL JUGULAR PERMANENT HEMODIALYSIS CATHETER Date:  03/09/2020 03/09/2020 1:54 pm Radiologist:  Jerilynn Mages. Daryll Brod, MD Guidance:  Ultrasound and fluoroscopic FLUOROSCOPY TIME:  Fluoroscopy Time: 0 minutes 36 seconds (2 mGy). MEDICATIONS: 1 g vancomycin within 1 hour of the procedure ANESTHESIA/SEDATION: Versed 1.5 mg IV; Fentanyl 50 mcg IV; Moderate Sedation Time:  16 minutes The patient was continuously monitored during the procedure by the interventional radiology nurse under my direct supervision. CONTRAST:  None. COMPLICATIONS: None immediate. PROCEDURE: Informed consent was obtained from the patient following explanation of the procedure, risks, benefits and alternatives. The patient understands, agrees and consents for the procedure. All questions were addressed. A time out was performed. Maximal barrier sterile technique utilized including caps, mask, sterile gowns, sterile gloves, large sterile drape, hand hygiene, and 2% chlorhexidine scrub. Under sterile conditions and local anesthesia, right internal jugular micropuncture venous access was performed with ultrasound. Images were obtained for  documentation of the patent right internal jugular vein. A guide wire was inserted followed by a transitional dilator. Next, a 0.035 guidewire was advanced into the IVC with a 5-French catheter. Measurements were obtained from the right venotomy site to the proximal right atrium. In the right infraclavicular chest, a subcutaneous tunnel was created under sterile conditions and local anesthesia. 1% lidocaine with epinephrine was utilized for this. The 19 cm tip to cuff palindrome catheter was tunneled subcutaneously to the venotomy site and inserted into the SVC/RA junction through a valved peel-away sheath. Position was confirmed with fluoroscopy. Images were obtained for documentation. Blood was aspirated from the catheter followed by saline and heparin flushes. The appropriate volume and strength of heparin was instilled in each lumen. Caps were applied. The catheter was secured at the tunnel site with Gelfoam and a pursestring suture. The venotomy site was closed with subcuticular Vicryl suture. Dermabond was applied to the small right neck incision. A dry sterile dressing was applied. The catheter is ready for use. No immediate complications. IMPRESSION: Ultrasound and fluoroscopically guided right internal jugular tunneled hemodialysis catheter (19 cm tip to cuff palindrome catheter). Electronically Signed   By: Jerilynn Mages.  Shick M.D.   On: 03/09/2020 14:03   IR US Guide Vasc Access Right  Result Date: 03/09/2020 INDICATION: End-stage renal disease, no current access for dialysis EXAM: ULTRASOUND GUIDANCE FOR VASCULAR ACCESS RIGHT INTERNAL JUGULAR PERMANENT HEMODIALYSIS CATHETER Date:  03/09/2020 03/09/2020 1:54 pm Radiologist:  Jerilynn Mages. Daryll Brod, MD Guidance:  Ultrasound and fluoroscopic FLUOROSCOPY TIME:  Fluoroscopy Time: 0 minutes 36 seconds (2 mGy). MEDICATIONS: 1 g vancomycin within 1 hour of the procedure ANESTHESIA/SEDATION: Versed 1.5 mg IV; Fentanyl 50 mcg IV; Moderate Sedation Time:  16 minutes The patient was  continuously monitored during the procedure by the interventional radiology nurse under my direct supervision. CONTRAST:  None. COMPLICATIONS: None immediate. PROCEDURE: Informed consent was obtained from the patient following explanation of the procedure, risks, benefits and alternatives. The patient understands, agrees and consents for the procedure. All questions were addressed. A time out was performed. Maximal barrier sterile technique utilized including caps, mask, sterile gowns, sterile gloves, large sterile drape, hand hygiene, and 2% chlorhexidine scrub. Under sterile conditions and local anesthesia, right internal jugular micropuncture venous access was performed with ultrasound. Images were obtained for documentation of the patent right internal jugular vein. A guide wire was  inserted followed by a transitional dilator. Next, a 0.035 guidewire was advanced into the IVC with a 5-French catheter. Measurements were obtained from the right venotomy site to the proximal right atrium. In the right infraclavicular chest, a subcutaneous tunnel was created under sterile conditions and local anesthesia. 1% lidocaine with epinephrine was utilized for this. The 19 cm tip to cuff palindrome catheter was tunneled subcutaneously to the venotomy site and inserted into the SVC/RA junction through a valved peel-away sheath. Position was confirmed with fluoroscopy. Images were obtained for documentation. Blood was aspirated from the catheter followed by saline and heparin flushes. The appropriate volume and strength of heparin was instilled in each lumen. Caps were applied. The catheter was secured at the tunnel site with Gelfoam and a pursestring suture. The venotomy site was closed with subcuticular Vicryl suture. Dermabond was applied to the small right neck incision. A dry sterile dressing was applied. The catheter is ready for use. No immediate complications. IMPRESSION: Ultrasound and fluoroscopically guided right  internal jugular tunneled hemodialysis catheter (19 cm tip to cuff palindrome catheter). Electronically Signed   By: Jerilynn Mages.  Shick M.D.   On: 03/09/2020 14:03   VAS [place] UPPER EXT VEIN MAPPING (PRE-OP AVF)  Result Date: 03/10/2020 UPPER EXTREMITY VEIN MAPPING  Indications: Pre-access. Comparison Study: no prior Performing Technologist: Abram Sander RVS  Examination Guidelines: A complete evaluation includes B-mode imaging, spectral Doppler, color Doppler, and power Doppler as needed of all accessible portions of each vessel. Bilateral testing is considered an integral part of a complete examination. Limited examinations for reoccurring indications may be performed as noted. +-----------------+-------------+----------+--------------+ Right Cephalic   Diameter (cm)Depth (cm)   Findings    +-----------------+-------------+----------+--------------+ Shoulder             0.22        0.93                  +-----------------+-------------+----------+--------------+ Prox upper arm       0.19        0.57                  +-----------------+-------------+----------+--------------+ Mid upper arm        0.18        0.63                  +-----------------+-------------+----------+--------------+ Dist upper arm       0.28        0.52                  +-----------------+-------------+----------+--------------+ Antecubital fossa    0.27        0.34                  +-----------------+-------------+----------+--------------+ Prox forearm         0.25        0.47     branching    +-----------------+-------------+----------+--------------+ Mid forearm                             not visualized +-----------------+-------------+----------+--------------+ Dist forearm                            not visualized +-----------------+-------------+----------+--------------+ Wrist                                   not  visualized +-----------------+-------------+----------+--------------+  +-----------------+-------------+----------+---------+ Right Basilic    Diameter (cm)Depth (cm)Findings  +-----------------+-------------+----------+---------+ Prox upper arm       0.30        1.04             +-----------------+-------------+----------+---------+ Mid upper arm        0.26        1.35             +-----------------+-------------+----------+---------+ Dist upper arm       0.28        1.28             +-----------------+-------------+----------+---------+ Antecubital fossa    0.20        0.74   branching +-----------------+-------------+----------+---------+ Prox forearm         0.13        0.24             +-----------------+-------------+----------+---------+ Mid forearm          0.12        0.22             +-----------------+-------------+----------+---------+ Distal forearm       0.13        0.27             +-----------------+-------------+----------+---------+ +-----------------+-------------+----------+---------+ Left Cephalic    Diameter (cm)Depth (cm)Findings  +-----------------+-------------+----------+---------+ Shoulder             0.21        0.80             +-----------------+-------------+----------+---------+ Prox upper arm       0.21        0.66             +-----------------+-------------+----------+---------+ Mid upper arm        0.24        0.58             +-----------------+-------------+----------+---------+ Dist upper arm       0.23        0.51             +-----------------+-------------+----------+---------+ Antecubital fossa    0.40        0.21   branching +-----------------+-------------+----------+---------+ Prox forearm         0.22        0.34             +-----------------+-------------+----------+---------+ Mid forearm          0.27        0.46             +-----------------+-------------+----------+---------+ Dist forearm         0.25        0.47              +-----------------+-------------+----------+---------+ Wrist                0.23        0.29             +-----------------+-------------+----------+---------+ +-----------------+-------------+----------+--------------+ Left Basilic     Diameter (cm)Depth (cm)   Findings    +-----------------+-------------+----------+--------------+ Prox upper arm       0.24        1.03                  +-----------------+-------------+----------+--------------+ Mid upper arm        0.22        1.75                  +-----------------+-------------+----------+--------------+  Dist upper arm       0.20        1.37                  +-----------------+-------------+----------+--------------+ Antecubital fossa    0.22        0.83                  +-----------------+-------------+----------+--------------+ Prox forearm         0.16        0.60     branching    +-----------------+-------------+----------+--------------+ Mid forearm                             not visualized +-----------------+-------------+----------+--------------+ Distal forearm                          not visualized +-----------------+-------------+----------+--------------+ Elbow                                   not visualized +-----------------+-------------+----------+--------------+ *See table(s) above for measurements and observations.  Diagnosing physician: Deitra Mayo MD Electronically signed by Deitra Mayo MD on 03/10/2020 at 5:00:01 PM.    Final    . chlorhexidine gluconate (MEDLINE KIT)  15 mL Mouth Rinse BID  . Chlorhexidine Gluconate Cloth  6 each Topical Q0600  . dextromethorphan  30 mg Oral BID  . docusate sodium  100 mg Oral BID  . feeding supplement  237 mL Oral TID BM  . levETIRAcetam  250 mg Oral Q T,Th,Sa-HD  . levETIRAcetam  500 mg Oral BID  . mouth rinse  15 mL Mouth Rinse BID  . metoprolol tartrate  50 mg Oral BID  . multivitamin  1 tablet Oral QHS  . NIFEdipine  90 mg  Oral Daily  . sodium chloride flush  10-40 mL Intracatheter Q12H    BMET    Component Value Date/Time   NA 133 (L) 03/11/2020 0206   K 3.5 03/11/2020 0206   CL 99 03/11/2020 0206   CO2 23 03/11/2020 0206   GLUCOSE 93 03/11/2020 0206   BUN 32 (H) 03/11/2020 0206   CREATININE 6.59 (H) 03/11/2020 0206   CALCIUM 7.7 (L) 03/11/2020 0206   CALCIUM 6.2 (LL) 02/26/2020 0305   GFRNONAA 7 (L) 03/11/2020 0206   GFRAA >60 01/28/2016 1543   CBC    Component Value Date/Time   WBC 9.2 03/11/2020 0206   RBC 2.78 (L) 03/11/2020 0206   HGB 8.4 (L) 03/11/2020 0206   HCT 24.5 (L) 03/11/2020 0206   PLT 232 03/11/2020 0206   MCV 88.1 03/11/2020 0206   MCH 30.2 03/11/2020 0206   MCHC 34.3 03/11/2020 0206   RDW 14.9 03/11/2020 0206   LYMPHSABS 0.9 03/11/2020 0206   MONOABS 1.4 (H) 03/11/2020 0206   EOSABS 0.1 03/11/2020 0206   BASOSABS 0.1 03/11/2020 0206    Assessment/Plan:  1. 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 Given the biopsy results I think we can call her ESRD. 1. Continue HD per TTS schedule 2. Dr. Oneida Alar saw patient, plan for AVG creation on  3/11, greatly appreciate help 3. Undergoing CLIP process 2. PEA arrest after temp HD catheter placed. Resolved and extubated. 3. ID -low-grade fevers and tachycardia.  Work-up negative for infection.  Now improved 4. Hypertension: BP slightly high.  Has improved with dialysis.  Continue to monitor 5. Anemia: Multifactorial.  Hemoglobin improved to 8.7 today.  Iron studies likely not accurate at this time given recent transfusion.  Consider Aranesp and obtaining iron studies in a week or 2. 6. Seizure activity - neurology consulted  and felt to be related to metabolic derangements. On keppra per neuro. 7. Anxiety: Multifactorial related to acute illness and realization of her future regarding kidney disease.  Enlisted help of chaplain. Appreciate help

## 2020-03-11 NOTE — Plan of Care (Signed)
  Problem: Clinical Measurements: Goal: Respiratory complications will improve Outcome: Progressing   Problem: Activity: Goal: Risk for activity intolerance will decrease Outcome: Progressing   Problem: Nutrition: Goal: Adequate nutrition will be maintained Outcome: Progressing   

## 2020-03-11 NOTE — Progress Notes (Signed)
Round on patient today for ESRD follow-up. Patient is up in the chair and agreeable. Patient remembers recent conversation secondary to new start HD and has been reading up on FSGS. Patient then voices concern for whether or not she will have insurance and where she will have her HD. Patient also voices that is speaking to Kim, the renal navigator. This RN reiterates that those issues are Colleen's specialty and see what she says. Patient understands. Will continue to follow as appropriate.  Mady Gemma, RN Dialysis Nurse Coordinator [phone]

## 2020-03-11 NOTE — Progress Notes (Signed)
Nutrition Follow-up  DOCUMENTATION CODES:   Not applicable  INTERVENTION:   Consider liberalizing diet  Continue Ensure Enlive po BID, each supplement provides 350 kcal and 20 grams of protein  Continue Renal MVI daily  Provided pt with written and verbal renal diet education; please see separate note for details   NUTRITION DIAGNOSIS:   Increased nutrient needs related to acute illness (renal failure requiring CRRT) as evidenced by estimated needs.  Continues but being addressed via oral supplements  GOAL:   Patient will meet greater than or equal to 90% of their needs  Progressing  MONITOR:   PO intake,Supplement acceptance,Labs,Weight trends,I & O's  REASON FOR ASSESSMENT:   Consult Diet education  ASSESSMENT:   65 yo female admitted with acute renal failure complicated by seizure and cardiac arrest requiring intubation on 2/23. PMH includes HTN, recent COVID infection Dec 2021.  2/24- start CRRT 2/25- extubated, stop CRRT 2/26- iHD 2/28- renal biopsy  Plan for AV graft on Friday 3/11; last HD via [facility] on 3/08. Pt currently on TTS schedule  No recorded po intake; pt reports appetite has always been up and down, even prior to this illness. However, pt does report that appetite is improving. Pt likes the Ensure supplements as is consuming.   Noted pt likely will require dialysis long term  Pt is oliguric; current wt 75.8 kg. Weight 75.1 kg post iHD yesterday (UF 2L yesterday). Noted moderate edema in LE; unsure of dry weight at this time  Labs: sodium 133, (L), potassium 3.5 (wdl), phosphorus 3.8 (wdl), corrected calcium 9.2, albumin 2.1 Meds: Rena-vite   Diet Order:   Diet Order            Diet renal with fluid restriction Room service appropriate? Yes; Fluid consistency: Thin  Diet effective now                 EDUCATION NEEDS:   Education needs have been addressed  Skin:  Skin Assessment: Reviewed RN Assessment  Last BM:  3/6  Height:    Ht Readings from Last 1 Encounters:  02/26/20 5' 2.99" (1.6 m)    Weight:   Wt Readings from Last 1 Encounters:  03/10/20 75.8 kg    Ideal Body Weight:  52.3 kg  BMI:  Body mass index is 29.61 kg/m.  Estimated Nutritional Needs:   Kcal:  2100-2300  Protein:  135-155 grams  Fluid:  1000 ml + UOP   Kerman Passey MS, RDN, LDN, CNSC Registered Dietitian III Clinical Nutrition RD Pager and On-Call Pager Number Located in [place]

## 2020-03-11 NOTE — Progress Notes (Signed)
Occupational Therapy Evaluation   03/11/20 1300  OT Visit Information  Last OT Received On 03/11/20  Assistance Needed +1  History of Present Illness 65 y.o. female with medical history significant of HTN. Pt had COVID in Dec, 1 week prior to presenting to ED 02/25/20 she developed swelling in BLE. Developed pain in B lower back; CT abd/pelvis - no obstruction or acute abnormality. HGB 6.3, +acute renal failure; 2/23 seizure and Code Blue with CPR x 4 minutes and intubated 2/23-2/25; CRRT; renal biopsy 2/28 with perinephric hematoma; pneumonia  Precautions  Precautions Fall  Precaution Comments Monitor HR  Pain Assessment  Pain Assessment Faces  Faces Pain Scale 2  Pain Location generalized  Pain Descriptors / Indicators Sore  Pain Intervention(s) Limited activity within patient's tolerance  Cognition  Arousal/Alertness Awake/alert  Behavior During Therapy Flat affect  Overall Cognitive Status Impaired/Different from baseline  Area of Impairment Problem solving  Problem Solving Slow processing  General Comments Patient answers therapists questions appropriately. A&Ox4 this date. Patient asks appropriate questions in response to education provided this date.  General Comments  General comments (skin integrity, edema, etc.) Patient with fatigue from previous PT treatment session and lunch tray recently delivered. Session with focus on education including safety with self-care tasks, safety with DME and education on energy cosnervation with written handout. OT will continue to follow acutely to maximize safety/independence with self-care tasks and decrease caregiver burden.  OT - End of Session  Activity Tolerance Patient tolerated treatment well  Patient left in chair;with call bell/phone within reach;with chair alarm set  OT Assessment/Plan  OT Plan Discharge plan remains appropriate;Frequency remains appropriate  OT Visit Diagnosis Unsteadiness on feet (R26.81);Muscle weakness  (generalized) (M62.81)  OT Frequency (ACUTE ONLY) Min 2X/week  Follow Up Recommendations Home health OT;Supervision/Assistance - 24 hour  OT Equipment 3 in 1 bedside commode;Tub/shower seat  AM-PAC OT "6 Clicks" Daily Activity Outcome Measure (Version 2)  Help from another person eating meals? 3  Help from another person taking care of personal grooming? 3  Help from another person toileting, which includes using toliet, bedpan, or urinal? 3  Help from another person bathing (including washing, rinsing, drying)? 2  Help from another person to put on and taking off regular upper body clothing? 3  Help from another person to put on and taking off regular lower body clothing? 2  6 Click Score 16  OT Goal Progression  Progress towards OT goals Progressing toward goals  Acute Rehab OT Goals  Patient Stated Goal To return home.  OT Goal Formulation With patient  Time For Goal Achievement 03/19/20  Potential to Achieve Goals Good  ADL Goals  Pt Will Perform Grooming standing;with supervision  Pt Will Perform Lower Body Dressing with supervision;sit to/from stand  Pt Will Transfer to Toilet ambulating;with supervision  Pt Will Perform Toileting - Clothing Manipulation and hygiene with supervision;sit to/from stand  Pt Will Perform Tub/Shower Transfer with supervision;3 in 1  Pt/caregiver will Perform Home Exercise Program Both right and left upper extremity;Increased strength;With written HEP provided  OT Time Calculation  OT Start Time (ACUTE ONLY) 1254  OT Stop Time (ACUTE ONLY) 1307  OT Time Calculation (min) 13 min  OT General Charges  $OT Visit 1 Visit  OT Treatments  $Therapeutic Activity 8-22 mins

## 2020-03-11 NOTE — Progress Notes (Signed)
Vein map reviewed.  No adequate vein for AV fistula.  Will plan AV graft left upper arm for Friday 3/11 with my partner Dr Carlis Abbott.  I will discuss details with pt tomorrow.    Ruta Hinds, MD [facility] Office: [phone]

## 2020-03-11 NOTE — Progress Notes (Signed)
Physical Therapy Treatment Patient Details Name: Barbara Crawford MRN: [id number] DOB: 1955-03-16 Today's Date: 03/11/2020    History of Present Illness 65 y.o. female with medical history significant of HTN. Pt had COVID in Dec, 1 week prior to presenting to ED 02/25/20 she developed swelling in BLE. Developed pain in B lower back; CT abd/pelvis - no obstruction or acute abnormality. HGB 6.3, +acute renal failure; 2/23 seizure and Code Blue with CPR x 4 minutes and intubated 2/23-2/25; CRRT; renal biopsy 2/28 with perinephric hematoma; pneumonia    PT Comments    Patient up in chair on arrival and reports she has been getting to [facility] by herself (as long as it is set up right beside her chair). She does not feel confident enough to walk by herself (and has multiple lines/monitor) and was able to walk with supervision in her room with RW. She also stood at sink for several minutes to complete basic ADLs (at her request). She required wide BOS to allow her to perform bimanual tasks without losing her balance. Overall, she has made slow progress due to multiple procedures throughout the week. Anticipate she will progress well as she accomodates to having dialysis. She is working on arranging to have her daughter stay with her when she goes home.     Follow Up Recommendations  Home health PT;Supervision/Assistance - 24 hour (initial 24/7 for <1 week)     Equipment Recommendations  Rolling walker with 5" wheels    Recommendations for Other Services       Precautions / Restrictions Precautions Precautions: Fall Precaution Comments: Monitor HR Restrictions Weight Bearing Restrictions: No    Mobility  Bed Mobility                    Transfers Overall transfer level: Needs assistance Equipment used: Rolling walker (2 wheeled) Transfers: Sit to/from Stand Sit to Stand: Supervision         General transfer comment: vc for safe sequencing with  RW  Ambulation/Gait Ambulation/Gait assistance: Supervision Gait Distance (Feet): 25 Feet (stood at sink x 5 minutes; 5 ft more) Assistive device: Rolling walker (2 wheeled) Gait Pattern/deviations: Decreased stride length;Wide base of support;Step-through pattern Gait velocity: decr   General Gait Details: very fatigued but wanted to at least get to the door and back (plus wash her face and brush her teeth); anticipate could have done longer walk if not for standing at sink   Stairs             Wheelchair Mobility    Modified Rankin (Stroke Patients Only)       Balance Overall balance assessment: Needs assistance Sitting-balance support: No upper extremity supported;Feet supported Sitting balance-Leahy Scale: Fair     Standing balance support: No upper extremity supported;During functional activity Standing balance-Leahy Scale: Fair Standing balance comment: bimanual tasks at the sink and did not lean body against counter                            Cognition Arousal/Alertness: Awake/alert Behavior During Therapy: Flat affect Overall Cognitive Status: Impaired/Different from baseline Area of Impairment: Problem solving                 Orientation Level:  (NT)           Problem Solving: Slow processing        Exercises      General Comments General comments (skin integrity, edema,  etc.): max HR 118      Pertinent Vitals/Pain Pain Assessment: Faces Faces Pain Scale: Hurts a little bit Pain Location: generalized Pain Descriptors / Indicators: Sore Pain Intervention(s): Limited activity within patient's tolerance;Monitored during session    Home Living                      Prior Function            PT Goals (current goals can now be found in the care plan section) Acute Rehab PT Goals Patient Stated Goal: To return home. Time For Goal Achievement: 03/19/20 Potential to Achieve Goals: Good Progress towards PT goals:  Progressing toward goals    Frequency    Min 3X/week      PT Plan Current plan remains appropriate    Co-evaluation              AM-PAC PT "6 Clicks" Mobility   Outcome Measure  Help needed turning from your back to your side while in a flat bed without using bedrails?: None Help needed moving from lying on your back to sitting on the side of a flat bed without using bedrails?: None Help needed moving to and from a bed to a chair (including a wheelchair)?: A Little Help needed standing up from a chair using your arms (e.g., wheelchair or bedside chair)?: A Little Help needed to walk in hospital room?: A Little Help needed climbing 3-5 steps with a railing? : A Little 6 Click Score: 20    End of Session   Activity Tolerance: Patient limited by fatigue Patient left: in chair;with call bell/phone within reach Nurse Communication: Mobility status PT Visit Diagnosis: Unsteadiness on feet (R26.81);Muscle weakness (generalized) (M62.81)     Time: [id number] PT Time Calculation (min) (ACUTE ONLY): 28 min  Charges:  $Gait Training: 8-22 mins $Therapeutic Activity: 8-22 mins                      Arby Barrette, PT Pager [phone]    [name] 03/11/2020, 10:28 AM

## 2020-03-11 NOTE — Plan of Care (Signed)
  Problem: Clinical Measurements: Goal: Complications related to the disease process or treatment will be avoided or minimized Outcome: Progressing   Problem: Clinical Measurements: Goal: Dialysis access will remain free of complications Outcome: Progressing   Problem: Fluid Volume: Goal: Fluid volume balance will be maintained or improved Outcome: Progressing   Problem: Nutritional: Goal: Ability to make appropriate dietary choices will improve Outcome: Progressing

## 2020-03-11 NOTE — Progress Notes (Signed)
PROGRESS NOTE    Barbara Crawford   VAN:[id number]  DOB: 08-10-1955  DOA: 02/25/2020     15  PCP: Vonna Drafts, FNP  Brief narrative/Hospital Course: [name] is a 65 yo female with past medical history of hypertension, prior Covid (Dec 2021) presented to the hospital with complaints of lower extremity swelling, back pain, generalized malaise, fatigue at home. In the ED, she was found to have a hemoglobin of 6.3, potassium 5.4, sodium 130, bicarb 9, BUN 144, and creatinine 29.98.  CT of the abdomen and pelvis was negative for obstruction or acute abnormality.  Temporary hemodialysis catheter was placed by IR and subsequently, the patient had a PEA arrest associated with seizure-like activity.  She was intubated and subsequently extubated 02/28/2020.   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 Renal biopsy done 03/03/2019.  ANA positive.  ID followed the patient for fever.Trialysis cath was removed on 03/06/20 and subsequently underwent permacath placement.  At this time, patient is awaiting for CLIP for hemodialysis.  PT following.  Likely need home health on discharge.   Assessment & Plan: Principal Problem:   Acute renal failure (HCC) Active Problems:   Normocytic anemia   Uremia   HTN (hypertension)   Perinephric hematoma   Seizure (HCC)   Lobar pneumonia (HCC)   Sepsis (HCC) versus SIRS due to autoimmune process   Fever   Glomerulonephritis   ABLA (acute blood loss anemia)   Pleural effusion   Encounter for orogastric (OG) tube placement   FSGS (focal segmental glomerulosclerosis)  Oliguric acute kidney injury now ESRD  Patient presented with elevation of BUN (144) and creat (29.98).  Renal ultrasound showed echogenic kidneys. CT renal stone study showed non-obstructive right nephrolithiasis. Patient was found to have nephrotic range proteinuria. ANA positive, no M spike detected on protein electrophoresis, ASO negative, C3 117,  C4 35, ANCA negative,GBM antibody negative.RPR non-reactive.  dsDNA negative, SCL 70 Positive+ (1.9), SSA IgG > 8+, SSB normal,  Chromatin antibody positive.   Status post renal biopsy 03/02/2020 with findings of focal segmental globular sclerosis..  Developed a perinephric hematoma post biopsy.    Currently undergoing hemodialysis.  Will likely need long-term dialysis, status post IR guided internal jugular tunneled hemodialysis catheter on 03/09/2020.  Pleural effusion Continue hemodialysis for volume management.  Patient does have hypoalbuminemia as well.  Moderate sized pleural effusion on CT on 3/4; no obvious HF on echo (EF 50-55%).  Lasix and albumin as per nephrology.  Acute blood loss anemia Stable at this time.  Continue to monitor hemoglobin.  Acute glomerulonephritis Status post renal biopsy which showed a focal segmental glomerulosclerosis.  Nephrology on board.  Fever Infectious disease was consulted.  Currently off antibiotic.  Had dialysis catheter which has removed and has received a new permacath.  Had been on antibiotic recently.  CT chest with effusion atelectasis.  Duplex ultrasound was negative for DVT.  Continue incentive spirometry.  Lobar pneumonia  Status post extubation 02/28/2020.  Procalcitonin was elevated.  Completed course of antibiotic.  Currently off antibiotic.  CT chest on 03/06/2020 showed bilateral effusion and atelectasis  Seizure  Patient had seizure with lossof pulse and PEA arrest s/p CPR.  Was seen by neurology and the plan is to continue Keppra 500 mg twice a day  Perinephric hematoma -Seen on CT abdomen/pelvis on 03/03/2020.  Continue to monitor closely.  Hemoglobin has remained stable at this time.  Essential hypertension Continue with nifedipine, metoprolol and labetalol  as needed.   We will continue to monitor closely.  Uremia Likely secondary to to AKI.  Continue hemodialysis.  Normocytic anemia Status post kidney biopsy and perinephric  hematoma.  Status post 2 units of packed RBC on 03/03/2020.  Continue Aranesp as per nephrology.  Recent hemoglobin of 8.4  Asymptomatic bacteriuria Received ceftriaxone empirically for infection.  UA reviewed from 02/29/2020: Nitrite negative, moderate leukocyte esterase, no bacteria, WBC clumps, 11-20 RBCs  Acute on chronic respiratory failure with hypoxia  resolved  03/06/2020.  Status post PEA arrest.  Extubated on 02/28/2020.  Now on room air  Cardiac arrest with pulseless electrical activity  Status post CPR and intubation.  Stable at this time.  Ventilator dependence-patient was extubated on 02/28/2020.   Debility, deconditioning, patient was able to move a few steps to bedside commode.  Continue to work with physical therapy.  Antimicrobials: None at this time.  DVT prophylaxis: SCDs Start: 02/25/20 2009     Code Status: Full Code   Family Communication:  None.  Disposition Plan: Status is: Inpatient  Remains inpatient appropriate because:, IV treatments appropriate due to intensity of illness or inability to take PO and Inpatient level of care appropriate due to severity of illness, will need hemodialysis set up as outpatient  Dispo:  Patient From: Home  Planned Disposition: Home with Health Care Svc   Anticipated date of discharge.  1 to 2 days, when OP HD is setup.  Medically stable for discharge: No  Subjective Today, patient was seen and examined at bedside.  Complains of mild cough.  Denies any nausea vomiting fever chills or rigor  Objective:  Vitals with BMI 03/10/2020 03/10/2020 03/10/2020  Height - - -  Weight - - -  BMI - - -  Systolic 655 374 827  Diastolic 95 98 91  Pulse 078 113 96    Physical Examination: General:  not in obvious distress, obese HENT:   No scleral pallor or icterus noted. Oral mucosa is moist.  Chest:  Diminished breath sounds bilaterally.   Right chest wall permacath in place. CVS: S1 &S2 heard. No murmur.  Regular rate and  rhythm. Abdomen: Soft, nontender, nondistended.  Bowel sounds are heard.   Extremities: No cyanosis, clubbing but with bilateral lower extremity edema noted peripheral pulses are palpable. Psych: Alert, awake and oriented, normal mood CNS:  No cranial nerve deficits.  Power equal in all extremities.   Skin: Warm and dry.  No rashes noted.   Consultants:   ID  Nephrology  Procedures:    Hemodialysis   internal jugular tunneled hemodialysis catheter on 03/09/2020.   Data Reviewed: I have personally reviewed following labs and imaging studies  Results for orders placed or performed during the hospital encounter of 02/25/20 (from the past 24 hour(s))  Glucose, capillary     Status: None   Collection Time: 03/10/20 11:22 AM  Result Value Ref Range   Glucose-Capillary 73 70 - 99 mg/dL  Glucose, capillary     Status: None   Collection Time: 03/10/20  4:18 PM  Result Value Ref Range   Glucose-Capillary 98 70 - 99 mg/dL  Glucose, capillary     Status: Abnormal   Collection Time: 03/10/20  8:31 PM  Result Value Ref Range   Glucose-Capillary 105 (H) 70 - 99 mg/dL  CBC with Differential/Platelet     Status: Abnormal   Collection Time: 03/11/20  2:06 AM  Result Value Ref Range   WBC 9.2 4.0 - 10.5 K/uL  RBC 2.78 (L) 3.87 - 5.11 MIL/uL   Hemoglobin 8.4 (L) 12.0 - 15.0 g/dL   HCT 24.5 (L) 36.0 - 46.0 %   MCV 88.1 80.0 - 100.0 fL   MCH 30.2 26.0 - 34.0 pg   MCHC 34.3 30.0 - 36.0 g/dL   RDW 14.9 11.5 - 15.5 %   Platelets 232 150 - 400 K/uL   nRBC 0.0 0.0 - 0.2 %   Neutrophils Relative % 72 %   Neutro Abs 6.7 1.7 - 7.7 K/uL   Lymphocytes Relative 10 %   Lymphs Abs 0.9 0.7 - 4.0 K/uL   Monocytes Relative 15 %   Monocytes Absolute 1.4 (H) 0.1 - 1.0 K/uL   Eosinophils Relative 1 %   Eosinophils Absolute 0.1 0.0 - 0.5 K/uL   Basophils Relative 1 %   Basophils Absolute 0.1 0.0 - 0.1 K/uL   Immature Granulocytes 1 %   Abs Immature Granulocytes 0.13 (H) 0.00 - 0.07 K/uL  Magnesium      Status: None   Collection Time: 03/11/20  2:06 AM  Result Value Ref Range   Magnesium 1.7 1.7 - 2.4 mg/dL  Renal function panel     Status: Abnormal   Collection Time: 03/11/20  2:06 AM  Result Value Ref Range   Sodium 133 (L) 135 - 145 mmol/L   Potassium 3.5 3.5 - 5.1 mmol/L   Chloride 99 98 - 111 mmol/L   CO2 23 22 - 32 mmol/L   Glucose, Bld 93 70 - 99 mg/dL   BUN 32 (H) 8 - 23 mg/dL   Creatinine, Ser 6.59 (H) 0.44 - 1.00 mg/dL   Calcium 7.7 (L) 8.9 - 10.3 mg/dL   Phosphorus 3.8 2.5 - 4.6 mg/dL   Albumin 2.1 (L) 3.5 - 5.0 g/dL   GFR, Estimated 7 (L) >60 mL/min   Anion gap 11 5 - 15    Recent Results (from the past 240 hour(s))  Urine Culture     Status: None   Collection Time: 03/04/20  5:30 AM   Specimen: Urine, Random  Result Value Ref Range Status   Specimen Description URINE, RANDOM  Final   Special Requests STERILE CUP  Final   Culture   Final    NO GROWTH Performed at [facility], [address], [place], [place] [address]    Report Status 03/05/2020 FINAL  Final  Culture, blood (routine x 2)     Status: None   Collection Time: 03/04/20  8:48 PM   Specimen: BLOOD  Result Value Ref Range Status   Specimen Description BLOOD LEFT ANTECUBITAL  Final   Special Requests   Final    BOTTLES DRAWN AEROBIC AND ANAEROBIC Blood Culture results may not be optimal due to an inadequate volume of blood received in culture bottles   Culture   Final    NO GROWTH 5 DAYS Performed at [facility], [place] [address] [place], [place] [address]    Report Status 03/09/2020 FINAL  Final  Culture, blood (routine x 2)     Status: None   Collection Time: 03/04/20  8:48 PM   Specimen: BLOOD LEFT HAND  Result Value Ref Range Status   Specimen Description BLOOD LEFT HAND  Final   Special Requests   Final    BOTTLES DRAWN AEROBIC AND ANAEROBIC Blood Culture results may not be optimal due to an inadequate volume of blood received in culture bottles   Culture   Final  NO  GROWTH 5 DAYS Performed at [facility], [place] [address], [place], [place] [address]    Report Status 03/09/2020 FINAL  Final     Radiology Studies: IR Fluoro Guide CV Line Right  Result Date: 03/09/2020 INDICATION: End-stage renal disease, no current access for dialysis EXAM: ULTRASOUND GUIDANCE FOR VASCULAR ACCESS RIGHT INTERNAL JUGULAR PERMANENT HEMODIALYSIS CATHETER Date:  03/09/2020 03/09/2020 1:54 pm Radiologist:  Jerilynn Mages. Daryll Brod, MD Guidance:  Ultrasound and fluoroscopic FLUOROSCOPY TIME:  Fluoroscopy Time: 0 minutes 36 seconds (2 mGy). MEDICATIONS: 1 g vancomycin within 1 hour of the procedure ANESTHESIA/SEDATION: Versed 1.5 mg IV; Fentanyl 50 mcg IV; Moderate Sedation Time:  16 minutes The patient was continuously monitored during the procedure by the interventional radiology nurse under my direct supervision. CONTRAST:  None. COMPLICATIONS: None immediate. PROCEDURE: Informed consent was obtained from the patient following explanation of the procedure, risks, benefits and alternatives. The patient understands, agrees and consents for the procedure. All questions were addressed. A time out was performed. Maximal barrier sterile technique utilized including caps, mask, sterile gowns, sterile gloves, large sterile drape, hand hygiene, and 2% chlorhexidine scrub. Under sterile conditions and local anesthesia, right internal jugular micropuncture venous access was performed with ultrasound. Images were obtained for documentation of the patent right internal jugular vein. A guide wire was inserted followed by a transitional dilator. Next, a 0.035 guidewire was advanced into the IVC with a 5-French catheter. Measurements were obtained from the right venotomy site to the proximal right atrium. In the right infraclavicular chest, a subcutaneous tunnel was created under sterile conditions and local anesthesia. 1% lidocaine with epinephrine was utilized for this. The 19 cm tip to cuff palindrome catheter was  tunneled subcutaneously to the venotomy site and inserted into the SVC/RA junction through a valved peel-away sheath. Position was confirmed with fluoroscopy. Images were obtained for documentation. Blood was aspirated from the catheter followed by saline and heparin flushes. The appropriate volume and strength of heparin was instilled in each lumen. Caps were applied. The catheter was secured at the tunnel site with Gelfoam and a pursestring suture. The venotomy site was closed with subcuticular Vicryl suture. Dermabond was applied to the small right neck incision. A dry sterile dressing was applied. The catheter is ready for use. No immediate complications. IMPRESSION: Ultrasound and fluoroscopically guided right internal jugular tunneled hemodialysis catheter (19 cm tip to cuff palindrome catheter). Electronically Signed   By: Jerilynn Mages.  Shick M.D.   On: 03/09/2020 14:03   IR US Guide Vasc Access Right  Result Date: 03/09/2020 INDICATION: End-stage renal disease, no current access for dialysis EXAM: ULTRASOUND GUIDANCE FOR VASCULAR ACCESS RIGHT INTERNAL JUGULAR PERMANENT HEMODIALYSIS CATHETER Date:  03/09/2020 03/09/2020 1:54 pm Radiologist:  Jerilynn Mages. Daryll Brod, MD Guidance:  Ultrasound and fluoroscopic FLUOROSCOPY TIME:  Fluoroscopy Time: 0 minutes 36 seconds (2 mGy). MEDICATIONS: 1 g vancomycin within 1 hour of the procedure ANESTHESIA/SEDATION: Versed 1.5 mg IV; Fentanyl 50 mcg IV; Moderate Sedation Time:  16 minutes The patient was continuously monitored during the procedure by the interventional radiology nurse under my direct supervision. CONTRAST:  None. COMPLICATIONS: None immediate. PROCEDURE: Informed consent was obtained from the patient following explanation of the procedure, risks, benefits and alternatives. The patient understands, agrees and consents for the procedure. All questions were addressed. A time out was performed. Maximal barrier sterile technique utilized including caps, mask, sterile gowns,  sterile gloves, large sterile drape, hand hygiene, and 2% chlorhexidine scrub. Under sterile conditions and local anesthesia, right  internal jugular micropuncture venous access was performed with ultrasound. Images were obtained for documentation of the patent right internal jugular vein. A guide wire was inserted followed by a transitional dilator. Next, a 0.035 guidewire was advanced into the IVC with a 5-French catheter. Measurements were obtained from the right venotomy site to the proximal right atrium. In the right infraclavicular chest, a subcutaneous tunnel was created under sterile conditions and local anesthesia. 1% lidocaine with epinephrine was utilized for this. The 19 cm tip to cuff palindrome catheter was tunneled subcutaneously to the venotomy site and inserted into the SVC/RA junction through a valved peel-away sheath. Position was confirmed with fluoroscopy. Images were obtained for documentation. Blood was aspirated from the catheter followed by saline and heparin flushes. The appropriate volume and strength of heparin was instilled in each lumen. Caps were applied. The catheter was secured at the tunnel site with Gelfoam and a pursestring suture. The venotomy site was closed with subcuticular Vicryl suture. Dermabond was applied to the small right neck incision. A dry sterile dressing was applied. The catheter is ready for use. No immediate complications. IMPRESSION: Ultrasound and fluoroscopically guided right internal jugular tunneled hemodialysis catheter (19 cm tip to cuff palindrome catheter). Electronically Signed   By: Jerilynn Mages.  Shick M.D.   On: 03/09/2020 14:03   VAS [place] UPPER EXT VEIN MAPPING (PRE-OP AVF)  Result Date: 03/10/2020 UPPER EXTREMITY VEIN MAPPING  Indications: Pre-access. Comparison Study: no prior Performing Technologist: Abram Sander RVS  Examination Guidelines: A complete evaluation includes B-mode imaging, spectral Doppler, color Doppler, and power Doppler as needed of all  accessible portions of each vessel. Bilateral testing is considered an integral part of a complete examination. Limited examinations for reoccurring indications may be performed as noted. +-----------------+-------------+----------+--------------+ Right Cephalic   Diameter (cm)Depth (cm)   Findings    +-----------------+-------------+----------+--------------+ Shoulder             0.22        0.93                  +-----------------+-------------+----------+--------------+ Prox upper arm       0.19        0.57                  +-----------------+-------------+----------+--------------+ Mid upper arm        0.18        0.63                  +-----------------+-------------+----------+--------------+ Dist upper arm       0.28        0.52                  +-----------------+-------------+----------+--------------+ Antecubital fossa    0.27        0.34                  +-----------------+-------------+----------+--------------+ Prox forearm         0.25        0.47     branching    +-----------------+-------------+----------+--------------+ Mid forearm                             not visualized +-----------------+-------------+----------+--------------+ Dist forearm                            not visualized +-----------------+-------------+----------+--------------+ Wrist  not visualized +-----------------+-------------+----------+--------------+ +-----------------+-------------+----------+---------+ Right Basilic    Diameter (cm)Depth (cm)Findings  +-----------------+-------------+----------+---------+ Prox upper arm       0.30        1.04             +-----------------+-------------+----------+---------+ Mid upper arm        0.26        1.35             +-----------------+-------------+----------+---------+ Dist upper arm       0.28        1.28             +-----------------+-------------+----------+---------+  Antecubital fossa    0.20        0.74   branching +-----------------+-------------+----------+---------+ Prox forearm         0.13        0.24             +-----------------+-------------+----------+---------+ Mid forearm          0.12        0.22             +-----------------+-------------+----------+---------+ Distal forearm       0.13        0.27             +-----------------+-------------+----------+---------+ +-----------------+-------------+----------+---------+ Left Cephalic    Diameter (cm)Depth (cm)Findings  +-----------------+-------------+----------+---------+ Shoulder             0.21        0.80             +-----------------+-------------+----------+---------+ Prox upper arm       0.21        0.66             +-----------------+-------------+----------+---------+ Mid upper arm        0.24        0.58             +-----------------+-------------+----------+---------+ Dist upper arm       0.23        0.51             +-----------------+-------------+----------+---------+ Antecubital fossa    0.40        0.21   branching +-----------------+-------------+----------+---------+ Prox forearm         0.22        0.34             +-----------------+-------------+----------+---------+ Mid forearm          0.27        0.46             +-----------------+-------------+----------+---------+ Dist forearm         0.25        0.47             +-----------------+-------------+----------+---------+ Wrist                0.23        0.29             +-----------------+-------------+----------+---------+ +-----------------+-------------+----------+--------------+ Left Basilic     Diameter (cm)Depth (cm)   Findings    +-----------------+-------------+----------+--------------+ Prox upper arm       0.24        1.03                  +-----------------+-------------+----------+--------------+ Mid upper arm        0.22        1.75                   +-----------------+-------------+----------+--------------+  Dist upper arm       0.20        1.37                  +-----------------+-------------+----------+--------------+ Antecubital fossa    0.22        0.83                  +-----------------+-------------+----------+--------------+ Prox forearm         0.16        0.60     branching    +-----------------+-------------+----------+--------------+ Mid forearm                             not visualized +-----------------+-------------+----------+--------------+ Distal forearm                          not visualized +-----------------+-------------+----------+--------------+ Elbow                                   not visualized +-----------------+-------------+----------+--------------+ *See table(s) above for measurements and observations.  Diagnosing physician: Deitra Mayo MD Electronically signed by Deitra Mayo MD on 03/10/2020 at 5:00:01 PM.    Final    VAS [place] UPPER EXT VEIN MAPPING (PRE-OP AVF)  Final Result    IR Fluoro Guide CV Line Right  Final Result    IR US Guide Vasc Access Right  Final Result    VAS [place] LOWER EXTREMITY VENOUS (DVT)  Final Result    VAS [place] UPPER EXTREMITY VENOUS DUPLEX  Final Result    CT CHEST WO CONTRAST  Final Result    CT ABDOMEN PELVIS WO CONTRAST  Final Result    US BIOPSY (KIDNEY)  Final Result    DG Chest 2 View  Final Result    DG CHEST PORT 1 VIEW  Final Result    MR BRAIN WO CONTRAST  Final Result    MR ANGIO HEAD WO CONTRAST  Final Result    CT HEAD WO CONTRAST  Final Result    DG Abd 1 View  Final Result    DG CHEST PORT 1 VIEW  Final Result    DG Chest Port 1 View  Final Result    IR Fluoro Guide CV Line Right  Final Result    IR US Guide Vasc Access Right  Final Result    US RENAL  Final Result    CT Renal Stone Study  Final Result      Scheduled Meds: . chlorhexidine gluconate (MEDLINE KIT)  15 mL Mouth  Rinse BID  . Chlorhexidine Gluconate Cloth  6 each Topical Q0600  . dextromethorphan  30 mg Oral BID  . docusate sodium  100 mg Oral BID  . feeding supplement  237 mL Oral TID BM  . levETIRAcetam  250 mg Oral Q T,Th,Sa-HD  . levETIRAcetam  500 mg Oral BID  . mouth rinse  15 mL Mouth Rinse BID  . metoprolol tartrate  50 mg Oral BID  . multivitamin  1 tablet Oral QHS  . NIFEdipine  90 mg Oral Daily  . sodium chloride flush  10-40 mL Intracatheter Q12H   PRN Meds: sodium chloride, acetaminophen, antiseptic oral rinse, guaiFENesin, labetalol, LORazepam, melatonin, ondansetron **OR** ondansetron (ZOFRAN) IV, oxyCODONE, sodium chloride flush Continuous Infusions: . sodium chloride Stopped (02/28/20 1701)     LOS: 15 days  Flora Lipps, MD [facility] 03/11/2020, 7:16 AM

## 2020-03-12 DIAGNOSIS — D62 Acute posthemorrhagic anemia: Secondary | ICD-10-CM | POA: Diagnosis not present

## 2020-03-12 DIAGNOSIS — J9621 Acute and chronic respiratory failure with hypoxia: Secondary | ICD-10-CM | POA: Diagnosis not present

## 2020-03-12 DIAGNOSIS — R8271 Bacteriuria: Secondary | ICD-10-CM | POA: Diagnosis not present

## 2020-03-12 DIAGNOSIS — N179 Acute kidney failure, unspecified: Secondary | ICD-10-CM | POA: Diagnosis not present

## 2020-03-12 LAB — RENAL FUNCTION PANEL
Albumin: 2.2 g/dL — ABNORMAL LOW (ref 3.5–5.0)
Anion gap: 13 (ref 5–15)
BUN: 42 mg/dL — ABNORMAL HIGH (ref 8–23)
CO2: 22 mmol/L (ref 22–32)
Calcium: 7.9 mg/dL — ABNORMAL LOW (ref 8.9–10.3)
Chloride: 97 mmol/L — ABNORMAL LOW (ref 98–111)
Creatinine, Ser: 8.02 mg/dL — ABNORMAL HIGH (ref 0.44–1.00)
GFR, Estimated: 5 mL/min — ABNORMAL LOW (ref >60)
Glucose, Bld: 104 mg/dL — ABNORMAL HIGH (ref 70–99)
Phosphorus: 4.2 mg/dL (ref 2.5–4.6)
Potassium: 3.6 mmol/L (ref 3.5–5.1)
Sodium: 132 mmol/L — ABNORMAL LOW (ref 135–145)

## 2020-03-12 LAB — MAGNESIUM: Magnesium: 1.8 mg/dL (ref 1.7–2.4)

## 2020-03-12 LAB — GLUCOSE, CAPILLARY
Glucose-Capillary: 139 mg/dL — ABNORMAL HIGH (ref 70–99)
Glucose-Capillary: 83 mg/dL (ref 70–99)
Glucose-Capillary: 93 mg/dL (ref 70–99)
Glucose-Capillary: 95 mg/dL (ref 70–99)

## 2020-03-12 LAB — CBC
HCT: 28.2 % — ABNORMAL LOW (ref 36.0–46.0)
Hemoglobin: 9.2 g/dL — ABNORMAL LOW (ref 12.0–15.0)
MCH: 29.4 pg (ref 26.0–34.0)
MCHC: 32.6 g/dL (ref 30.0–36.0)
MCV: 90.1 fL (ref 80.0–100.0)
Platelets: 298 10*3/uL (ref 150–400)
RBC: 3.13 MIL/uL — ABNORMAL LOW (ref 3.87–5.11)
RDW: 15.3 % (ref 11.5–15.5)
WBC: 10.1 10*3/uL (ref 4.0–10.5)
nRBC: 0 % (ref 0.0–0.2)

## 2020-03-12 MED ORDER — ONDANSETRON HCL 4 MG PO TABS
4.0000 mg | ORAL_TABLET | Freq: Four times a day (QID) | ORAL | Status: DC | PRN
  Filled 2020-03-12: qty 1

## 2020-03-12 MED ORDER — LABETALOL HCL 5 MG/ML IV SOLN
5.0000 mg | INTRAVENOUS | Status: DC | PRN
  Filled 2020-03-12 (×2): qty 4

## 2020-03-12 MED ORDER — GUAIFENESIN 100 MG/5ML PO SOLN
5.0000 mL | ORAL | Status: DC | PRN
  Filled 2020-03-12: qty 5

## 2020-03-12 MED ORDER — DOCUSATE SODIUM 100 MG PO CAPS
100.0000 mg | ORAL_CAPSULE | Freq: Two times a day (BID) | ORAL | Status: DC
  Administered 2020-03-12 – 2020-04-04 (×24): 100 mg via ORAL
  Filled 2020-03-12 (×35): qty 1

## 2020-03-12 MED ORDER — LORAZEPAM 2 MG/ML IJ SOLN: 1.0000 mg | INTRAMUSCULAR | Status: DC | PRN

## 2020-03-12 MED ORDER — RENA-VITE PO TABS
1.0000 | ORAL_TABLET | Freq: Every day | ORAL | Status: DC
  Administered 2020-03-12 – 2020-04-03 (×22): 1 via ORAL
  Filled 2020-03-12 (×23): qty 1

## 2020-03-12 MED ORDER — ENSURE ENLIVE PO LIQD
237.0000 mL | Freq: Three times a day (TID) | ORAL | Status: DC
  Administered 2020-03-12 – 2020-03-25 (×13): 237 mL via ORAL
  Filled 2020-03-12 (×4): qty 237

## 2020-03-12 MED ORDER — LEVETIRACETAM 250 MG PO TABS
250.0000 mg | ORAL_TABLET | ORAL | Status: DC
  Administered 2020-03-24 – 2020-04-04 (×7): 250 mg via ORAL
  Filled 2020-03-12 (×12): qty 1

## 2020-03-12 MED ORDER — ORAL CARE MOUTH RINSE
15.0000 mL | Freq: Two times a day (BID) | OROMUCOSAL | Status: DC
  Administered 2020-03-12 – 2020-04-02 (×28): 15 mL via OROMUCOSAL

## 2020-03-12 MED ORDER — CHLORHEXIDINE GLUCONATE CLOTH 2 % EX PADS
6.0000 | MEDICATED_PAD | Freq: Every day | CUTANEOUS | Status: DC
  Administered 2020-03-12 – 2020-03-25 (×9): 6 via TOPICAL

## 2020-03-12 MED ORDER — NIFEDIPINE ER OSMOTIC RELEASE 30 MG PO TB24
90.0000 mg | ORAL_TABLET | Freq: Every day | ORAL | Status: DC
  Administered 2020-03-12 – 2020-03-27 (×12): 90 mg via ORAL
  Filled 2020-03-12 (×11): qty 3
  Filled 2020-03-12: qty 1
  Filled 2020-03-12: qty 3

## 2020-03-12 MED ORDER — MELATONIN 3 MG PO TABS
3.0000 mg | ORAL_TABLET | Freq: Every evening | ORAL | Status: DC | PRN
  Administered 2020-03-12 – 2020-03-30 (×16): 3 mg via ORAL
  Filled 2020-03-12 (×19): qty 1

## 2020-03-12 MED ORDER — CHLORHEXIDINE GLUCONATE 0.12% ORAL RINSE (MEDLINE KIT)
15.0000 mL | Freq: Two times a day (BID) | OROMUCOSAL | Status: DC
  Administered 2020-03-12 – 2020-04-02 (×27): 15 mL via OROMUCOSAL

## 2020-03-12 MED ORDER — DEXTROMETHORPHAN POLISTIREX ER 30 MG/5ML PO SUER
30.0000 mg | Freq: Two times a day (BID) | ORAL | Status: DC
  Administered 2020-03-12 – 2020-03-31 (×33): 30 mg via ORAL
  Filled 2020-03-12 (×43): qty 5

## 2020-03-12 MED ORDER — HEPARIN SODIUM (PORCINE) 1000 UNIT/ML IJ SOLN
INTRAMUSCULAR | Status: AC
  Filled 2020-03-12: qty 2

## 2020-03-12 MED ORDER — ACETAMINOPHEN 500 MG PO TABS
1000.0000 mg | ORAL_TABLET | Freq: Three times a day (TID) | ORAL | Status: DC | PRN
  Administered 2020-03-12 – 2020-03-16 (×8): 1000 mg via ORAL
  Filled 2020-03-12 (×9): qty 2

## 2020-03-12 MED ORDER — METOPROLOL TARTRATE 50 MG PO TABS
50.0000 mg | ORAL_TABLET | Freq: Two times a day (BID) | ORAL | Status: DC
  Administered 2020-03-12 – 2020-03-13 (×4): 50 mg via ORAL
  Filled 2020-03-12 (×5): qty 1

## 2020-03-12 MED ORDER — OXYCODONE HCL 5 MG PO TABS
5.0000 mg | ORAL_TABLET | ORAL | Status: DC | PRN
  Administered 2020-04-02 – 2020-04-03 (×2): 5 mg via ORAL
  Filled 2020-03-12 (×2): qty 1

## 2020-03-12 MED ORDER — LEVETIRACETAM 500 MG PO TABS
500.0000 mg | ORAL_TABLET | Freq: Two times a day (BID) | ORAL | Status: DC
  Administered 2020-03-12 – 2020-03-23 (×21): 500 mg via ORAL
  Filled 2020-03-12 (×23): qty 1

## 2020-03-12 MED ORDER — ONDANSETRON HCL 4 MG/2ML IJ SOLN: 4.0000 mg | Freq: Four times a day (QID) | INTRAMUSCULAR | Status: DC | PRN

## 2020-03-12 NOTE — Progress Notes (Signed)
NoPatient ID: Barbara Crawford, female   DOB: 1955-12-27, 65 y.o.   MRN: [id number] S:.  Patient seen on dialysis today tolerating well with no complaints.  Feels that edema is slowly improving O:BP (!) 156/89   Pulse (!) 107   Temp 99.5 F (37.5 C) (Oral)   Resp (!) 23   Ht 5' 2.99" (1.6 m)   Wt 77 kg   SpO2 98%   BMI 30.08 kg/m  No intake or output data in the 24 hours ending 03/12/20 1046 Intake/Output: No intake/output data recorded.  Intake/Output this shift:  No intake/output data recorded. Weight change:  Gen: NAD CVS: Normal rate Resp: Lateral chest rise Abd: +BS, soft, NT/ND Ext: 2+ pitting edema in the bilateral lower extremities  Recent Labs  Lab 03/06/20 0237 03/07/20 0110 03/08/20 0046 03/09/20 0150 03/10/20 0025 03/11/20 0206 03/12/20 0019  NA 132* 132* 133* 131* 131* 133* 132*  K 4.7 3.5 3.6 3.5 3.7 3.5 3.6  CL 96* 98 98 96* 98 99 97*  CO2 20* _0 18* 23 22  GLUCOSE 62* 85 89 104* 87 93 104*  BUN 35* 18 35* 46* 56* 32* 42*  CREATININE 6.36* 4.47* 6.48* 8.33* 9.41* 6.59* 8.02*  ALBUMIN 1.4*  --   --  1.7* 1.8* 2.1* 2.2*  CALCIUM 7.4* 7.1* 7.1* 7.5* 7.6* 7.7* 7.9*  PHOS 4.9* 3.6 4.3 5.0* 5.7* 3.8 4.2   Liver Function Tests: Recent Labs  Lab 03/10/20 0025 03/11/20 0206 03/12/20 0019  ALBUMIN 1.8* 2.1* 2.2*   No results for input(s): LIPASE, AMYLASE in the last 168 hours. No results for input(s): AMMONIA in the last 168 hours. CBC: Recent Labs  Lab 03/08/20 0046 03/08/20 1231 03/09/20 0150 03/10/20 0025 03/11/20 0206 03/12/20 0019  WBC 11.8*  --  9.9 10.1 9.2 10.1  NEUTROABS 9.0*  --  7.7 8.0* 6.7  --   HGB 6.6*   < > 8.8* 8.7* 8.4* 9.2*  HCT 20.0*   < > 26.4* 26.0* 24.5* 28.2*  MCV 88.5  --  89.2 89.0 88.1 90.1  PLT 278  --  301 293 232 298   < > = values in this interval not displayed.   Cardiac Enzymes: No results for input(s): CKTOTAL, CKMB, CKMBINDEX, TROPONINI in the last 168 hours. CBG: Recent Labs  Lab  03/11/20 0805 03/11/20 1142 03/11/20 1616 03/11/20 2040 03/12/20 0712  GLUCAP 90 82 93 115* 139*    Iron Studies: No results for input(s): IRON, TIBC, TRANSFERRIN, FERRITIN in the last 72 hours. Studies/Results: VAS [place] UPPER EXT VEIN MAPPING (PRE-OP AVF)  Result Date: 03/10/2020 UPPER EXTREMITY VEIN MAPPING  Indications: Pre-access. Comparison Study: no prior Performing Technologist: Abram Sander RVS  Examination Guidelines: A complete evaluation includes B-mode imaging, spectral Doppler, color Doppler, and power Doppler as needed of all accessible portions of each vessel. Bilateral testing is considered an integral part of a complete examination. Limited examinations for reoccurring indications may be performed as noted. +-----------------+-------------+----------+--------------+ Right Cephalic   Diameter (cm)Depth (cm)   Findings    +-----------------+-------------+----------+--------------+ Shoulder             0.22        0.93                  +-----------------+-------------+----------+--------------+ Prox upper arm       0.19        0.57                  +-----------------+-------------+----------+--------------+  Mid upper arm        0.18        0.63                  +-----------------+-------------+----------+--------------+ Dist upper arm       0.28        0.52                  +-----------------+-------------+----------+--------------+ Antecubital fossa    0.27        0.34                  +-----------------+-------------+----------+--------------+ Prox forearm         0.25        0.47     branching    +-----------------+-------------+----------+--------------+ Mid forearm                             not visualized +-----------------+-------------+----------+--------------+ Dist forearm                            not visualized +-----------------+-------------+----------+--------------+ Wrist                                   not visualized  +-----------------+-------------+----------+--------------+ +-----------------+-------------+----------+---------+ Right Basilic    Diameter (cm)Depth (cm)Findings  +-----------------+-------------+----------+---------+ Prox upper arm       0.30        1.04             +-----------------+-------------+----------+---------+ Mid upper arm        0.26        1.35             +-----------------+-------------+----------+---------+ Dist upper arm       0.28        1.28             +-----------------+-------------+----------+---------+ Antecubital fossa    0.20        0.74   branching +-----------------+-------------+----------+---------+ Prox forearm         0.13        0.24             +-----------------+-------------+----------+---------+ Mid forearm          0.12        0.22             +-----------------+-------------+----------+---------+ Distal forearm       0.13        0.27             +-----------------+-------------+----------+---------+ +-----------------+-------------+----------+---------+ Left Cephalic    Diameter (cm)Depth (cm)Findings  +-----------------+-------------+----------+---------+ Shoulder             0.21        0.80             +-----------------+-------------+----------+---------+ Prox upper arm       0.21        0.66             +-----------------+-------------+----------+---------+ Mid upper arm        0.24        0.58             +-----------------+-------------+----------+---------+ Dist upper arm       0.23        0.51             +-----------------+-------------+----------+---------+ Antecubital fossa  0.40        0.21   branching +-----------------+-------------+----------+---------+ Prox forearm         0.22        0.34             +-----------------+-------------+----------+---------+ Mid forearm          0.27        0.46             +-----------------+-------------+----------+---------+ Dist forearm          0.25        0.47             +-----------------+-------------+----------+---------+ Wrist                0.23        0.29             +-----------------+-------------+----------+---------+ +-----------------+-------------+----------+--------------+ Left Basilic     Diameter (cm)Depth (cm)   Findings    +-----------------+-------------+----------+--------------+ Prox upper arm       0.24        1.03                  +-----------------+-------------+----------+--------------+ Mid upper arm        0.22        1.75                  +-----------------+-------------+----------+--------------+ Dist upper arm       0.20        1.37                  +-----------------+-------------+----------+--------------+ Antecubital fossa    0.22        0.83                  +-----------------+-------------+----------+--------------+ Prox forearm         0.16        0.60     branching    +-----------------+-------------+----------+--------------+ Mid forearm                             not visualized +-----------------+-------------+----------+--------------+ Distal forearm                          not visualized +-----------------+-------------+----------+--------------+ Elbow                                   not visualized +-----------------+-------------+----------+--------------+ *See table(s) above for measurements and observations.  Diagnosing physician: Deitra Mayo MD Electronically signed by Deitra Mayo MD on 03/10/2020 at 5:00:01 PM.    Final    . chlorhexidine gluconate (MEDLINE KIT)  15 mL Mouth Rinse BID  . Chlorhexidine Gluconate Cloth  6 each Topical Q0600  . dextromethorphan  30 mg Oral BID  . docusate sodium  100 mg Oral BID  . feeding supplement  237 mL Oral TID BM  . heparin sodium (porcine)      . levETIRAcetam  250 mg Oral Q T,Th,Sa-HD  . levETIRAcetam  500 mg Oral BID  . mouth rinse  15 mL Mouth Rinse BID  . metoprolol tartrate  50 mg  Oral BID  . multivitamin  1 tablet Oral QHS  . NIFEdipine  90 mg Oral Daily  . sodium chloride flush  10-40 mL Intracatheter Q12H    BMET    Component Value Date/Time   NA  132 (L) 03/12/2020 0019   K 3.6 03/12/2020 0019   CL 97 (L) 03/12/2020 0019   CO2 22 03/12/2020 0019   GLUCOSE 104 (H) 03/12/2020 0019   BUN 42 (H) 03/12/2020 0019   CREATININE 8.02 (H) 03/12/2020 0019   CALCIUM 7.9 (L) 03/12/2020 0019   CALCIUM 6.2 (LL) 02/26/2020 0305   GFRNONAA 5 (L) 03/12/2020 0019   GFRAA >60 01/28/2016 1543   CBC    Component Value Date/Time   WBC 10.1 03/12/2020 0019   RBC 3.13 (L) 03/12/2020 0019   HGB 9.2 (L) 03/12/2020 0019   HCT 28.2 (L) 03/12/2020 0019   PLT 298 03/12/2020 0019   MCV 90.1 03/12/2020 0019   MCH 29.4 03/12/2020 0019   MCHC 32.6 03/12/2020 0019   RDW 15.3 03/12/2020 0019   LYMPHSABS 0.9 03/11/2020 0206   MONOABS 1.4 (H) 03/11/2020 0206   EOSABS 0.1 03/11/2020 0206   BASOSABS 0.1 03/11/2020 0206    Assessment/Plan:  1. 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 Given the biopsy results I think we can call her ESRD. 1. Continue HD per TTS schedule 2. Dr. Oneida Alar saw patient, plan for AVG creation on 3/11, greatly appreciate help 3. Undergoing CLIP process; issues with insurance 2. PEA arrest after temp HD catheter placed. Resolved and extubated.  Doing well 3. ID -low-grade fevers and tachycardia.  Work-up negative for infection.  Now improved 4. Hypertension: BP slightly high.  Has improved with dialysis.  Continue to monitor 5. Anemia: Multifactorial.   Iron studies likely not accurate at this time given recent transfusion.  Consider Aranesp and  obtaining iron studies in a week or 2. 6. Seizure activity - neurology consulted and felt to be related to metabolic derangements. On keppra per neuro. 7. Anxiety: Multifactorial related to acute illness and realization of her future regarding kidney disease.  Multiple people helping.  Appreciate help

## 2020-03-12 NOTE — Progress Notes (Signed)
PROGRESS NOTE    Barbara Crawford   KGU:[id number]  DOB: 1955/04/04  DOA: 02/25/2020     16  PCP: Vonna Drafts, FNP  Brief narrative/Hospital Course: Barbara Crawford is a 65 yo female with past medical history of hypertension, prior Covid (Dec 2021) presented to the hospital with complaints of lower extremity swelling, back pain, generalized malaise, fatigue at home. In the ED, she was found to have a hemoglobin of 6.3, potassium 5.4, sodium 130, bicarb 9, BUN 144, and creatinine 29.98.  CT of the abdomen and pelvis was negative for obstruction or acute abnormality.  Temporary hemodialysis catheter was placed by IR and subsequently, the patient had a PEA arrest associated with seizure-like activity.  She was intubated and subsequently extubated 02/28/2020.   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 Renal biopsy done 03/03/2019.  ANA positive.  ID followed the patient for fever.Trialysis cath was removed on 03/06/20 and subsequently underwent permacath placement.  At this time, patient is awaiting for CLIP for hemodialysis.  PT following.  Likely need home health on discharge.   Assessment & Plan: Principal Problem:   Acute renal failure (HCC) Active Problems:   Normocytic anemia   Uremia   HTN (hypertension)   Perinephric hematoma   Seizure (HCC)   Lobar pneumonia (HCC)   Sepsis (HCC) versus SIRS due to autoimmune process   Fever   Glomerulonephritis   ABLA (acute blood loss anemia)   Pleural effusion   Encounter for orogastric (OG) tube placement   FSGS (focal segmental glomerulosclerosis)  Oliguric acute kidney injury now ESRD  Patient presented with elevation of BUN (144) and creat (29.98).  Renal ultrasound showed echogenic kidneys. CT renal stone study showed non-obstructive right nephrolithiasis. Patient was found to have nephrotic range proteinuria. ANA positive, no M spike detected on protein electrophoresis, ASO negative, C3 117,  C4 35, ANCA negative,GBM antibody negative.RPR non-reactive.  dsDNA negative, SCL 70 Positive+ (1.9), SSA IgG > 8+, SSB normal,  Chromatin antibody positive.   Status post renal biopsy 03/02/2020 with findings of focal segmental globular sclerosis..  Developed a perinephric hematoma post biopsy.    Currently undergoing hemodialysis.  Will likely need long-term dialysis, status post IR guided internal jugular tunneled hemodialysis catheter on 03/09/2020.  Awaiting for outpatient hemodialysis arrangement.  Pleural effusion Continue hemodialysis for volume management.  Patient does have hypoalbuminemia as well.  Moderate sized pleural effusion on CT on 3/4; no obvious HF on echo (EF 50-55%).  Lasix and albumin as per nephrology.  No respiratory distress at this time, on room air  Acute blood loss anemia Stable at this time.  Continue to monitor hemoglobin.  Latest hemoglobin of 9.2  Acute glomerulonephritis Status post renal biopsy which showed a focal segmental glomerulosclerosis.  Nephrology on board.  Follow nephrology recommendations  Fever Infectious disease was consulted.  Currently off antibiotic.  Had dialysis catheter which has removed and has received a new permacath.  Had been on antibiotic recently.  CT chest with effusion atelectasis.  Duplex ultrasound was negative for DVT.  Continue incentive spirometry.  T-max of 100.1 F.  WBC of 10.1.  Lobar pneumonia  Status post extubation 02/28/2020.  Procalcitonin was elevated.  Completed course of antibiotic.  Currently off antibiotic.  CT chest on 03/06/2020 showed bilateral effusion and atelectasis  Seizure  Patient had seizure with lossof pulse and PEA arrest s/p CPR.  Was seen by neurology and the plan is to continue Keppra 500 mg  twice a day  Perinephric hematoma -Seen on CT abdomen/pelvis on 03/03/2020.  Continue to monitor closely.  Hemoglobin has remained stable at this time.  Essential hypertension Continue with nifedipine, metoprolol  and labetalol as needed.   We will continue to monitor closely.  Uremia Resolved.  Likely secondary to to AKI.  Continue hemodialysis.  Normocytic anemia Status post kidney biopsy and perinephric hematoma.  Status post 2 units of packed RBC on 03/03/2020.  Continue Aranesp as per nephrology.  Recent hemoglobin of 9.2.  Asymptomatic bacteriuria  Acute on chronic respiratory failure with hypoxia  Resolved.  Status post PEA arrest.  Extubated on 02/28/2020.  Now on room air  Cardiac arrest with pulseless electrical activity  Status post CPR and intubation.  Stable at this time.  Ventilator dependence-patient was extubated on 02/28/2020.   Debility, deconditioning, continue to work with physical therapy.  Antimicrobials: None at this time.  DVT prophylaxis:      Code Status: Full Code   Family Communication:  None.  Disposition Plan: Status is: Inpatient  Remains inpatient appropriate because:, IV treatments appropriate due to intensity of illness or inability to take PO and Inpatient level of care appropriate due to severity of illness, will need hemodialysis set up as outpatient  Dispo:  Patient From: Home  Planned Disposition: Home with Health Care Svc   Anticipated date of discharge.  1 to 2 days, when OP HD is setup.  Medically stable for discharge: No  Subjective Today, patient was seen and examined at bedside during hemodialysis.  Denies any cough, shortness of breath, chest pain, chills or rigor.  Mild fever was noted.  Objective:  Vitals with BMI 03/12/2020 03/12/2020 03/12/2020  Height - - -  Weight - 163 lbs 2 oz -  BMI - 40.34 -  Systolic 742 - 595  Diastolic 85 - 89  Pulse 638 - 107   Body mass index is 28.91 kg/m.  Physical Examination: General: , not in obvious distress, alert awake and oriented HENT:   No scleral pallor or icterus noted. Oral mucosa is moist.  Right internal jugular permacath in place. Chest:   Diminished breath sounds bilaterally. No  crackles or wheezes.  CVS: S1 &S2 heard. No murmur.  Regular rate and rhythm. Abdomen: Soft, nontender, nondistended.  Bowel sounds are heard.   Extremities: No cyanosis, clubbing but bilateral lower extremity pitting edema noted, peripheral pulses are palpable. Psych: Alert, awake and oriented, normal mood CNS:  No cranial nerve deficits.  Power equal in all extremities.   Skin: Warm and dry.  No rashes noted.   Consultants:   ID  Nephrology  Procedures:    Hemodialysis   internal jugular tunneled hemodialysis catheter on 03/09/2020.  Data Reviewed: I have personally reviewed following labs and imaging studies  Results for orders placed or performed during the hospital encounter of 02/25/20 (from the past 24 hour(s))  Glucose, capillary     Status: None   Collection Time: 03/11/20  4:16 PM  Result Value Ref Range   Glucose-Capillary 93 70 - 99 mg/dL  Glucose, capillary     Status: Abnormal   Collection Time: 03/11/20  8:40 PM  Result Value Ref Range   Glucose-Capillary 115 (H) 70 - 99 mg/dL  Renal function panel     Status: Abnormal   Collection Time: 03/12/20 12:19 AM  Result Value Ref Range   Sodium 132 (L) 135 - 145 mmol/L   Potassium 3.6 3.5 - 5.1 mmol/L   Chloride 97 (  L) 98 - 111 mmol/L   CO2 22 22 - 32 mmol/L   Glucose, Bld 104 (H) 70 - 99 mg/dL   BUN 42 (H) 8 - 23 mg/dL   Creatinine, Ser 8.02 (H) 0.44 - 1.00 mg/dL   Calcium 7.9 (L) 8.9 - 10.3 mg/dL   Phosphorus 4.2 2.5 - 4.6 mg/dL   Albumin 2.2 (L) 3.5 - 5.0 g/dL   GFR, Estimated 5 (L) >60 mL/min   Anion gap 13 5 - 15  CBC     Status: Abnormal   Collection Time: 03/12/20 12:19 AM  Result Value Ref Range   WBC 10.1 4.0 - 10.5 K/uL   RBC 3.13 (L) 3.87 - 5.11 MIL/uL   Hemoglobin 9.2 (L) 12.0 - 15.0 g/dL   HCT 28.2 (L) 36.0 - 46.0 %   MCV 90.1 80.0 - 100.0 fL   MCH 29.4 26.0 - 34.0 pg   MCHC 32.6 30.0 - 36.0 g/dL   RDW 15.3 11.5 - 15.5 %   Platelets 298 150 - 400 K/uL   nRBC 0.0 0.0 - 0.2 %  Magnesium      Status: None   Collection Time: 03/12/20 12:19 AM  Result Value Ref Range   Magnesium 1.8 1.7 - 2.4 mg/dL  Glucose, capillary     Status: Abnormal   Collection Time: 03/12/20  7:12 AM  Result Value Ref Range   Glucose-Capillary 139 (H) 70 - 99 mg/dL  Glucose, capillary     Status: None   Collection Time: 03/12/20 11:44 AM  Result Value Ref Range   Glucose-Capillary 83 70 - 99 mg/dL    Recent Results (from the past 240 hour(s))  Urine Culture     Status: None   Collection Time: 03/04/20  5:30 AM   Specimen: Urine, Random  Result Value Ref Range Status   Specimen Description URINE, RANDOM  Final   Special Requests STERILE CUP  Final   Culture   Final    NO GROWTH Performed at [facility], [address] [place], [place] [address]    Report Status 03/05/2020 FINAL  Final  Culture, blood (routine x 2)     Status: None   Collection Time: 03/04/20  8:48 PM   Specimen: BLOOD  Result Value Ref Range Status   Specimen Description BLOOD LEFT ANTECUBITAL  Final   Special Requests   Final    BOTTLES DRAWN AEROBIC AND ANAEROBIC Blood Culture results may not be optimal due to an inadequate volume of blood received in culture bottles   Culture   Final    NO GROWTH 5 DAYS Performed at [facility], [place] [address], [place], [place] [address]    Report Status 03/09/2020 FINAL  Final  Culture, blood (routine x 2)     Status: None   Collection Time: 03/04/20  8:48 PM   Specimen: BLOOD LEFT HAND  Result Value Ref Range Status   Specimen Description BLOOD LEFT HAND  Final   Special Requests   Final    BOTTLES DRAWN AEROBIC AND ANAEROBIC Blood Culture results may not be optimal due to an inadequate volume of blood received in culture bottles   Culture   Final    NO GROWTH 5 DAYS Performed at [facility], [place] [address] [place], [place] [address]    Report Status 03/09/2020 FINAL  Final     Radiology Studies: No results found. VAS [place] UPPER EXT VEIN MAPPING (PRE-OP  AVF)  Final Result  IR Fluoro Guide CV Line Right  Final Result    IR US Guide Vasc Access Right  Final Result    VAS [place] LOWER EXTREMITY VENOUS (DVT)  Final Result    VAS [place] UPPER EXTREMITY VENOUS DUPLEX  Final Result    CT CHEST WO CONTRAST  Final Result    CT ABDOMEN PELVIS WO CONTRAST  Final Result    US BIOPSY (KIDNEY)  Final Result    DG Chest 2 View  Final Result    DG CHEST PORT 1 VIEW  Final Result    MR BRAIN WO CONTRAST  Final Result    MR ANGIO HEAD WO CONTRAST  Final Result    CT HEAD WO CONTRAST  Final Result    DG Abd 1 View  Final Result    DG CHEST PORT 1 VIEW  Final Result    DG Chest Port 1 View  Final Result    IR Fluoro Guide CV Line Right  Final Result    IR US Guide Vasc Access Right  Final Result    US RENAL  Final Result    CT Renal Stone Study  Final Result      Scheduled Meds: . chlorhexidine gluconate (MEDLINE KIT)  15 mL Mouth Rinse BID  . Chlorhexidine Gluconate Cloth  6 each Topical Q0600  . dextromethorphan  30 mg Oral BID  . docusate sodium  100 mg Oral BID  . feeding supplement  237 mL Oral TID BM  . levETIRAcetam  250 mg Oral Q T,Th,Sa-HD  . levETIRAcetam  500 mg Oral BID  . mouth rinse  15 mL Mouth Rinse BID  . metoprolol tartrate  50 mg Oral BID  . multivitamin  1 tablet Oral QHS  . NIFEdipine  90 mg Oral Daily   PRN Meds: acetaminophen, guaiFENesin, labetalol, LORazepam, melatonin, ondansetron **OR** ondansetron (ZOFRAN) IV, oxyCODONE Continuous Infusions:    LOS: 16 days    Flora Lipps, MD [facility] 03/12/2020, 2:17 PM

## 2020-03-12 NOTE — Progress Notes (Signed)
Awaiting call back from [facility] to see if patient's insurance is in Network with them. Patient has not been financially cleared with [facility] and will await word from financial team. Patient may have to be referred to [facility] in [place].  Alphonzo Cruise, [place] [facility]al Navigator [phone]

## 2020-03-12 NOTE — Progress Notes (Signed)
Physical Therapy Treatment Patient Details Name: Barbara Crawford MRN: [id number] DOB: December 13, 1955 Today's Date: 03/12/2020    History of Present Illness 65 y.o. female with medical history significant of HTN. Pt had COVID in Dec, 1 week prior to presenting to ED 02/25/20 she developed swelling in BLE. Developed pain in B lower back; CT abd/pelvis - no obstruction or acute abnormality. HGB 6.3, +acute renal failure; 2/23 seizure and Code Blue with CPR x 4 minutes and intubated 2/23-2/25; CRRT; renal biopsy 2/28 with perinephric hematoma; pneumonia;  IJ catheter placed and HD initiated. Plans to have dialysis access placed 3/11    PT Comments    Patient intentionally seen post-dialysis as she reports extreme fatigue due to dialysis. Patient initially needed to use the bathroom and refused walking into the bathroom because it was too fatiguing and used BSC. Discussed how she would climb 2 flights of steps to her apartment after dialysis if she cannot walk to bathroom. Discussed possible need for post-acute therapy prior to home. Patient does not want to go anywhere but home and then agreed to walk in her room and completed 90 ft. She has asked daughter to count how many steps she needs to go up to get to her apartment.     Follow Up Recommendations  Home health PT;Supervision/Assistance - 24 hour (initial 24/7 for <1 week)     Equipment Recommendations  Rolling walker with 5" wheels    Recommendations for Other Services       Precautions / Restrictions Precautions Precautions: Fall Precaution Comments: Monitor HR    Mobility  Bed Mobility Overal bed mobility: Needs Assistance Bed Mobility: Supine to Sit     Supine to sit: Supervision     General bed mobility comments: incr time and effort    Transfers Overall transfer level: Needs assistance Equipment used: Rolling walker (2 wheeled) Transfers: Sit to/from Stand Sit to Stand: Supervision         General transfer  comment: vc for safe sequencing with RW  Ambulation/Gait Ambulation/Gait assistance: Supervision Gait Distance (Feet): 90 Feet Assistive device: Rolling walker (2 wheeled) Gait Pattern/deviations: Decreased stride length;Wide base of support;Step-through pattern Gait velocity: decr   General Gait Details: required encouragement to walk farther than sink; discussed possible need for post-acute therapy and pt then agreed to walk farther (she wants to go straight home)   Stairs Stairs:  (pt called dtr and asked her to count how many steps she has to ascend)           Wheelchair Mobility    Modified Rankin (Stroke Patients Only)       Balance Overall balance assessment: Needs assistance Sitting-balance support: No upper extremity supported;Feet supported Sitting balance-Leahy Scale: Fair     Standing balance support: No upper extremity supported;During functional activity Standing balance-Leahy Scale: Fair Standing balance comment: bimanual tasks at the sink and did not lean body against counter                            Cognition Arousal/Alertness: Awake/alert Behavior During Therapy: Flat affect Overall Cognitive Status: Within Functional Limits for tasks assessed                   Orientation Level:  (NT)                    Exercises Other Exercises Other Exercises: stood at sink performing ADL tasks without UE support  but with wide BOS    General Comments        Pertinent Vitals/Pain Pain Assessment: Faces Faces Pain Scale: Hurts a little bit Pain Location: generalized Pain Descriptors / Indicators: Sore Pain Intervention(s): Limited activity within patient's tolerance    Home Living                      Prior Function            PT Goals (current goals can now be found in the care plan section) Acute Rehab PT Goals Patient Stated Goal: To return home. Time For Goal Achievement: 03/19/20 Potential to Achieve  Goals: Good Progress towards PT goals: Progressing toward goals    Frequency    Min 3X/week      PT Plan Current plan remains appropriate    Co-evaluation              AM-PAC PT "6 Clicks" Mobility   Outcome Measure  Help needed turning from your back to your side while in a flat bed without using bedrails?: None Help needed moving from lying on your back to sitting on the side of a flat bed without using bedrails?: None Help needed moving to and from a bed to a chair (including a wheelchair)?: A Little Help needed standing up from a chair using your arms (e.g., wheelchair or bedside chair)?: A Little Help needed to walk in hospital room?: A Little Help needed climbing 3-5 steps with a railing? : A Little 6 Click Score: 20    End of Session   Activity Tolerance: Patient limited by fatigue Patient left: in chair;with call bell/phone within reach Nurse Communication: Mobility status PT Visit Diagnosis: Unsteadiness on feet (R26.81);Muscle weakness (generalized) (M62.81)     Time: [id number] PT Time Calculation (min) (ACUTE ONLY): 33 min  Charges:  $Gait Training: 23-37 mins                      Arby Barrette, PT Pager [phone]    [name] 03/12/2020, 4:57 PM

## 2020-03-12 NOTE — Anesthesia Preprocedure Evaluation (Addendum)
Anesthesia Evaluation  Patient identified by MRN, date of birth, ID band Patient awake    Reviewed: Allergy & Precautions, NPO status , Patient's Chart, lab work & pertinent test results  History of Anesthesia Complications Negative for: history of anesthetic complications  Airway Mallampati: II  TM Distance: >3 FB Neck ROM: Full    Dental  (+) Missing,    Pulmonary neg pulmonary ROS,    Pulmonary exam normal        Cardiovascular hypertension, Pt. on medications Normal cardiovascular exam     Neuro/Psych negative neurological ROS  negative psych ROS   GI/Hepatic negative GI ROS, Neg liver ROS,   Endo/Other  negative endocrine ROS  Renal/GU ESRF and DialysisRenal disease  negative genitourinary   Musculoskeletal negative musculoskeletal ROS (+)   Abdominal   Peds  Hematology  (+) anemia , Hgb 9.2   Anesthesia Other Findings Day of surgery medications reviewed with patient.  Reproductive/Obstetrics negative OB ROS                            Anesthesia Physical Anesthesia Plan  ASA: III  Anesthesia Plan: MAC   Post-op Pain Management:    Induction:   PONV Risk Score and Plan: 2 and Treatment may vary due to age or medical condition, Propofol infusion and Midazolam  Airway Management Planned: Natural Airway and Nasal Cannula  Additional Equipment: None  Intra-op Plan:   Post-operative Plan:   Informed Consent: I have reviewed the patients History and Physical, chart, labs and discussed the procedure including the risks, benefits and alternatives for the proposed anesthesia with the patient or authorized representative who has indicated his/her understanding and acceptance.       Plan Discussed with: CRNA  Anesthesia Plan Comments:        Anesthesia Quick Evaluation

## 2020-03-12 NOTE — Progress Notes (Signed)
PT Cancellation Note  Patient Details Name: Barbara Crawford MRN: [id number] DOB: 1955/07/04   Cancelled Treatment:    Reason Eval/Treat Not Completed: Patient not medically ready  Patient running a fever with HR 122 at rest. Recently given tylenol and requesting time for tylenol to work and agrees she will work with PT later today.   Arby Barrette, PT Pager [phone]   Jeanie Cooks Sasser 03/12/2020, 1:28 PM

## 2020-03-12 NOTE — TOC Progression Note (Signed)
Transition of Care [facility]) - Progression Note    Patient Details  Name: Barbara Crawford MRN: [id number] Date of Birth: [birth year]  Transition of Care [facility]) CM/SW Contact  Joanne Chars, LCSW Phone Number: 03/12/2020, 1:43 PM  Clinical Narrative:   CSW spoke with Santa Lighter in financial counseling who can reach out to pt and offer assistance with medicaid application as new dialysis pt.  CSW discussed this with pt.  Pt also confirmed that her daughter will be in the home to assist her at discharge.      Expected Discharge Plan: [place] Barriers to Discharge: Continued Medical Work up  Expected Discharge Plan and Services Expected Discharge Plan: [place] Choice: [place] arrangements for the past 2 months: Apartment                           HH Arranged: PT,OT [place]: [facility] Date [place]: 03/09/20 Time [place]: 1100 Representative spoke with at [place]: [place]   Social Determinants of Health ([place]) Interventions    Readmission Risk Interventions No flowsheet data found.

## 2020-03-12 NOTE — Progress Notes (Addendum)
NPO, consent and lab orders placed for OR tomorrow  Patient seen and examined.  Discussed with patient that she will have a left upper arm graft placed tomorrow by my partner Dr. Carlis Abbott.  Questions were answered.  Risk benefits possible complications were discussed.  Patient wishes to proceed.  N.p.o. after midnight.  Ruta Hinds, MD [facility] [place] Office: [phone]

## 2020-03-13 ENCOUNTER — Other Ambulatory Visit: Payer: Self-pay

## 2020-03-13 ENCOUNTER — Encounter (HOSPITAL_COMMUNITY): Payer: Self-pay | Admitting: Internal Medicine

## 2020-03-13 ENCOUNTER — Inpatient Hospital Stay (HOSPITAL_COMMUNITY): Payer: 59

## 2020-03-13 ENCOUNTER — Inpatient Hospital Stay (HOSPITAL_COMMUNITY): Payer: 59 | Admitting: Anesthesiology

## 2020-03-13 ENCOUNTER — Encounter (HOSPITAL_COMMUNITY)
Admission: EM | Disposition: A | Discharge status: Home Health | Payer: Self-pay | Source: Home / Self Care | Attending: Internal Medicine

## 2020-03-13 DIAGNOSIS — I468 Cardiac arrest due to other underlying condition: Secondary | ICD-10-CM | POA: Diagnosis not present

## 2020-03-13 DIAGNOSIS — J9621 Acute and chronic respiratory failure with hypoxia: Secondary | ICD-10-CM | POA: Diagnosis not present

## 2020-03-13 DIAGNOSIS — R8271 Bacteriuria: Secondary | ICD-10-CM | POA: Diagnosis not present

## 2020-03-13 DIAGNOSIS — Z992 Dependence on renal dialysis: Secondary | ICD-10-CM

## 2020-03-13 DIAGNOSIS — Z8616 Personal history of COVID-19: Secondary | ICD-10-CM | POA: Diagnosis not present

## 2020-03-13 DIAGNOSIS — D62 Acute posthemorrhagic anemia: Secondary | ICD-10-CM | POA: Diagnosis not present

## 2020-03-13 DIAGNOSIS — N186 End stage renal disease: Secondary | ICD-10-CM

## 2020-03-13 DIAGNOSIS — N001 Acute nephritic syndrome with focal and segmental glomerular lesions: Secondary | ICD-10-CM | POA: Diagnosis not present

## 2020-03-13 DIAGNOSIS — N179 Acute kidney failure, unspecified: Secondary | ICD-10-CM | POA: Diagnosis not present

## 2020-03-13 DIAGNOSIS — G928 Other toxic encephalopathy: Secondary | ICD-10-CM | POA: Diagnosis not present

## 2020-03-13 HISTORY — PX: AV FISTULA PLACEMENT: SHX1204

## 2020-03-13 LAB — GLUCOSE, CAPILLARY
Glucose-Capillary: 77 mg/dL (ref 70–99)
Glucose-Capillary: 68 mg/dL — ABNORMAL LOW (ref 70–99)
Glucose-Capillary: 82 mg/dL (ref 70–99)
Glucose-Capillary: 85 mg/dL (ref 70–99)
Glucose-Capillary: 83 mg/dL (ref 70–99)
Glucose-Capillary: 98 mg/dL (ref 70–99)

## 2020-03-13 LAB — POCT I-STAT, CHEM 8
BUN: 24 mg/dL — ABNORMAL HIGH (ref 8–23)
Calcium, Ion: 1.04 mmol/L — ABNORMAL LOW (ref 1.15–1.40)
Chloride: 97 mmol/L — ABNORMAL LOW (ref 98–111)
Creatinine, Ser: 6 mg/dL — ABNORMAL HIGH (ref 0.44–1.00)
Glucose, Bld: 92 mg/dL (ref 70–99)
HCT: 26 % — ABNORMAL LOW (ref 36.0–46.0)
Hemoglobin: 8.8 g/dL — ABNORMAL LOW (ref 12.0–15.0)
Potassium: 3.6 mmol/L (ref 3.5–5.1)
Sodium: 134 mmol/L — ABNORMAL LOW (ref 135–145)
TCO2: 26 mmol/L (ref 22–32)

## 2020-03-13 IMAGING — DX DG CHEST 1V PORT
1 series · 1 of 1 positions shown · non-contrast
Comparison: [DATE]

CLINICAL DATA: Pneumonia

EXAM:
PORTABLE CHEST 1 VIEW

[chest]
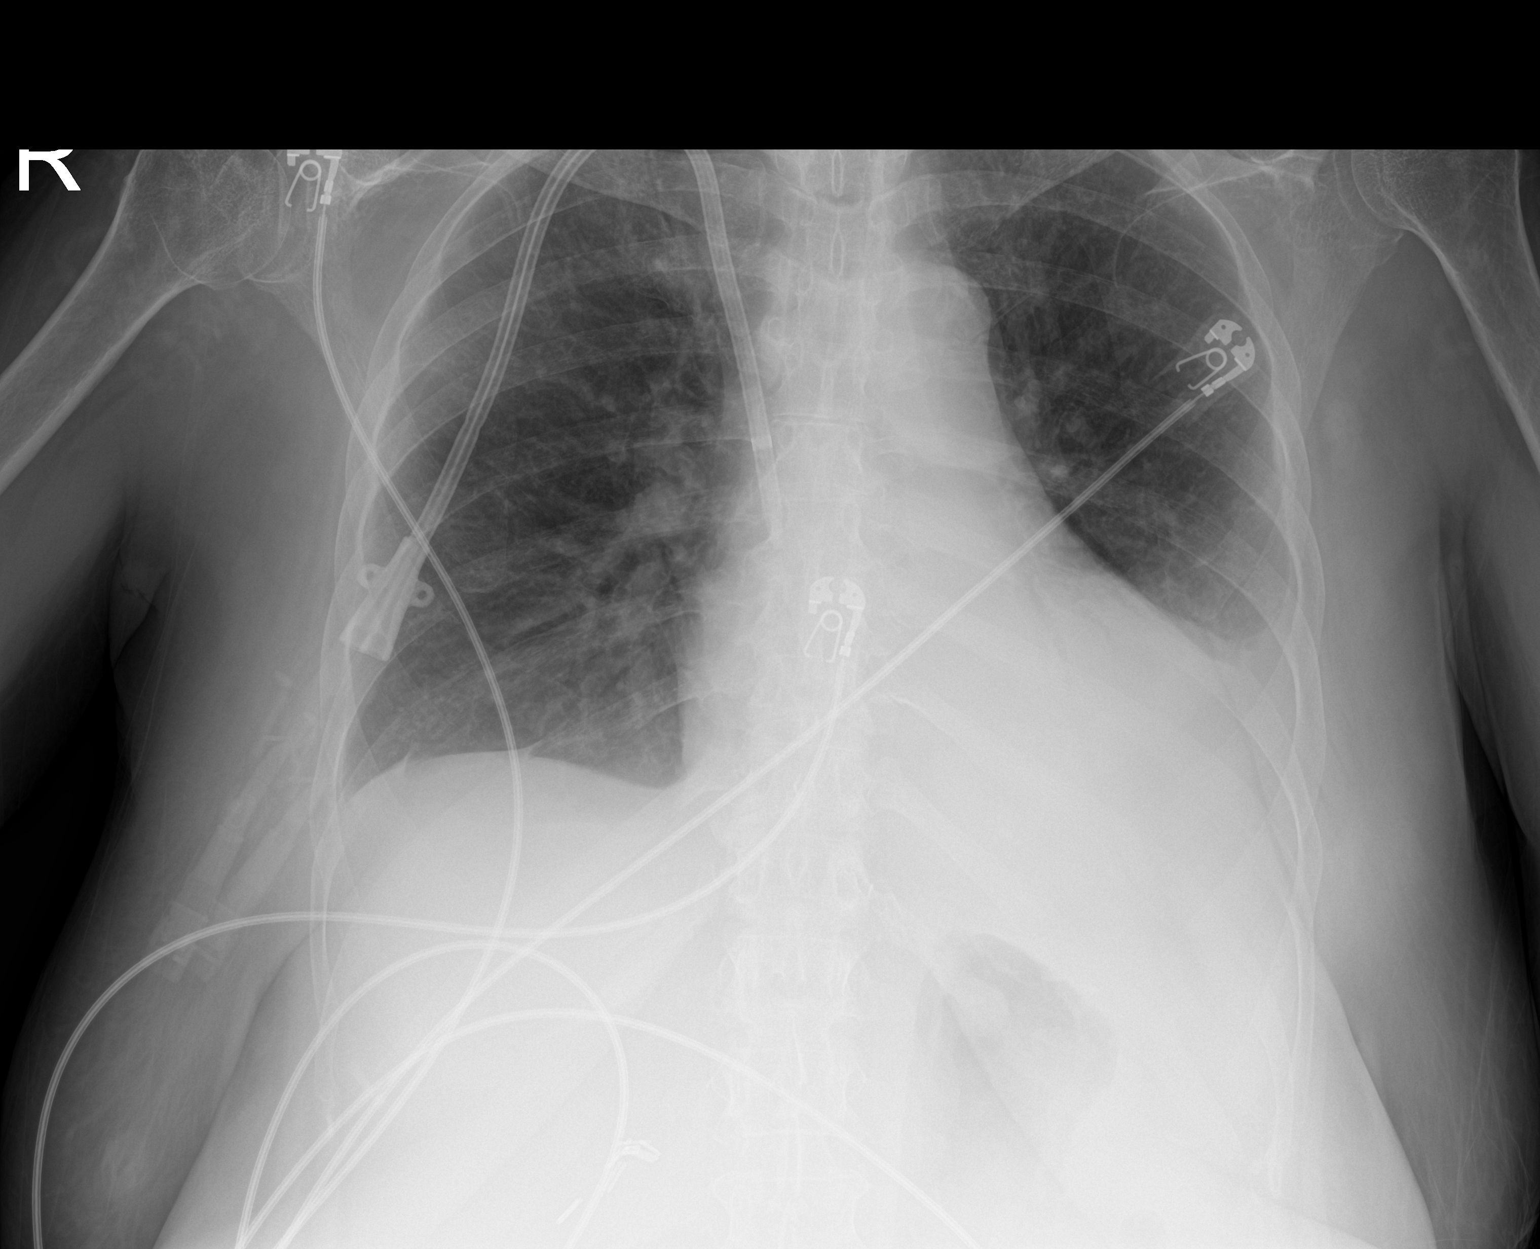

[1 of 1 positions shown; findings below may reference images not displayed]

FINDINGS: Single frontal view of the chest demonstrates increased
opacification of the lower left hemithorax, compatible with
increased consolidation and effusion. Right chest is clear. No
pneumothorax. Cardiac silhouette is stable. Right internal jugular
dialysis catheter tip overlies superior vena cava.
IMPRESSION: 1. Increasing left basilar consolidation and effusion, compatible
with given history of pneumonia.

## 2020-03-13 SURGERY — INSERTION OF ARTERIOVENOUS (AV) GORE-TEX GRAFT ARM
Anesthesia: Monitor Anesthesia Care | Site: Arm Upper | Laterality: Left

## 2020-03-13 MED ORDER — ALPRAZOLAM 0.25 MG PO TABS
0.2500 mg | ORAL_TABLET | Freq: Once | ORAL | Status: AC
  Administered 2020-03-13: 0.25 mg via ORAL
  Filled 2020-03-13: qty 1

## 2020-03-13 MED ORDER — CHLORHEXIDINE GLUCONATE 0.12 % MT SOLN
OROMUCOSAL | Status: AC
  Administered 2020-03-13: 15 mL via OROMUCOSAL
  Filled 2020-03-13: qty 15

## 2020-03-13 MED ORDER — MEPIVACAINE HCL (PF) 2 % IJ SOLN
INTRAMUSCULAR | Status: DC | PRN
  Administered 2020-03-13: 30 mL

## 2020-03-13 MED ORDER — SODIUM CHLORIDE 0.9 % IV SOLN: INTRAVENOUS | Status: DC

## 2020-03-13 MED ORDER — HEPARIN SODIUM (PORCINE) 1000 UNIT/ML IJ SOLN
INTRAMUSCULAR | Status: DC | PRN
  Administered 2020-03-13: 3000 [IU] via INTRAVENOUS

## 2020-03-13 MED ORDER — EPINEPHRINE PF 1 MG/ML IJ SOLN
INTRAMUSCULAR | Status: DC | PRN
  Administered 2020-03-13: .15 mg via SUBCUTANEOUS

## 2020-03-13 MED ORDER — PROMETHAZINE HCL 25 MG/ML IJ SOLN: 6.2500 mg | INTRAMUSCULAR | Status: DC | PRN

## 2020-03-13 MED ORDER — FENTANYL CITRATE (PF) 100 MCG/2ML IJ SOLN
INTRAMUSCULAR | Status: DC | PRN
  Administered 2020-03-13: 50 ug via INTRAVENOUS

## 2020-03-13 MED ORDER — OXYCODONE HCL 5 MG PO TABS
5.0000 mg | ORAL_TABLET | Freq: Once | ORAL | Status: DC | PRN
Start: 2020-03-13 — End: 2020-03-13

## 2020-03-13 MED ORDER — OXYCODONE HCL 5 MG/5ML PO SOLN: 5.0000 mg | Freq: Once | ORAL | Status: DC | PRN

## 2020-03-13 MED ORDER — SODIUM CHLORIDE 0.9 % IV SOLN: INTRAVENOUS | Status: DC | PRN

## 2020-03-13 MED ORDER — SODIUM CHLORIDE 0.9 % IV SOLN
INTRAVENOUS | Status: DC | PRN
  Administered 2020-03-13: 500 mL

## 2020-03-13 MED ORDER — MIDAZOLAM HCL 5 MG/5ML IJ SOLN
INTRAMUSCULAR | Status: DC | PRN
  Administered 2020-03-13: 2 mg via INTRAVENOUS

## 2020-03-13 MED ORDER — FENTANYL CITRATE (PF) 250 MCG/5ML IJ SOLN
INTRAMUSCULAR | Status: AC
  Filled 2020-03-13: qty 5

## 2020-03-13 MED ORDER — VANCOMYCIN HCL 1000 MG/200ML IV SOLN
1000.0000 mg | INTRAVENOUS | Status: AC
  Administered 2020-03-13: 1000 mg via INTRAVENOUS

## 2020-03-13 MED ORDER — 0.9 % SODIUM CHLORIDE (POUR BTL) OPTIME
TOPICAL | Status: DC | PRN
  Administered 2020-03-13: 1000 mL

## 2020-03-13 MED ORDER — PHENYLEPHRINE HCL (PRESSORS) 10 MG/ML IV SOLN
INTRAVENOUS | Status: DC | PRN
  Administered 2020-03-13: 80 ug via INTRAVENOUS

## 2020-03-13 MED ORDER — PROPOFOL 500 MG/50ML IV EMUL
INTRAVENOUS | Status: DC | PRN
  Administered 2020-03-13: 100 ug/kg/min via INTRAVENOUS

## 2020-03-13 MED ORDER — FENTANYL CITRATE (PF) 100 MCG/2ML IJ SOLN: 25.0000 ug | INTRAMUSCULAR | Status: DC | PRN

## 2020-03-13 MED ORDER — MIDAZOLAM HCL 2 MG/2ML IJ SOLN
INTRAMUSCULAR | Status: AC
  Filled 2020-03-13: qty 2

## 2020-03-13 SURGICAL SUPPLY — 36 items
ARMBAND PINK RESTRICT EXTREMIT (MISCELLANEOUS) ×4 IMPLANT
BLADE CLIPPER SURG (BLADE) ×2 IMPLANT
CANISTER SUCT 3000ML PPV (MISCELLANEOUS) ×2 IMPLANT
CATH EMB 2FR 60CM (CATHETERS) ×2 IMPLANT
CLIP VESOCCLUDE MED 6/CT (CLIP) ×2 IMPLANT
CLIP VESOCCLUDE SM WIDE 6/CT (CLIP) ×2 IMPLANT
COVER PROBE W GEL 5X96 (DRAPES) ×2 IMPLANT
COVER WAND RF STERILE (DRAPES) ×2 IMPLANT
DECANTER SPIKE VIAL GLASS SM (MISCELLANEOUS) ×2 IMPLANT
DERMABOND ADVANCED (GAUZE/BANDAGES/DRESSINGS) ×1
DERMABOND ADVANCED .7 DNX12 (GAUZE/BANDAGES/DRESSINGS) ×1 IMPLANT
ELECT REM PT RETURN 9FT ADLT (ELECTROSURGICAL) ×2
ELECTRODE REM PT RTRN 9FT ADLT (ELECTROSURGICAL) ×1 IMPLANT
GLOVE BIO SURGEON STRL SZ7.5 (GLOVE) ×2 IMPLANT
GLOVE SRG 8 PF TXTR STRL LF DI (GLOVE) ×1 IMPLANT
GLOVE SURG UNDER POLY LF SZ8 (GLOVE) ×1
GOWN STRL REUS W/ TWL LRG LVL3 (GOWN DISPOSABLE) ×2 IMPLANT
GOWN STRL REUS W/ TWL XL LVL3 (GOWN DISPOSABLE) ×2 IMPLANT
GOWN STRL REUS W/TWL LRG LVL3 (GOWN DISPOSABLE) ×2
GOWN STRL REUS W/TWL XL LVL3 (GOWN DISPOSABLE) ×2
HEMOSTAT SPONGE AVITENE ULTRA (HEMOSTASIS) IMPLANT
KIT BASIN OR (CUSTOM PROCEDURE TRAY) ×2 IMPLANT
KIT TURNOVER KIT B (KITS) ×2 IMPLANT
NS IRRIG 1000ML POUR BTL (IV SOLUTION) ×2 IMPLANT
PACK CV ACCESS (CUSTOM PROCEDURE TRAY) ×2 IMPLANT
PAD ARMBOARD 7.5X6 YLW CONV (MISCELLANEOUS) ×4 IMPLANT
SUT GORETEX 6.0 TT13 (SUTURE) IMPLANT
SUT MNCRL AB 4-0 PS2 18 (SUTURE) ×2 IMPLANT
SUT PROLENE 6 0 BV (SUTURE) ×4 IMPLANT
SUT PROLENE 7 0 BV 1 (SUTURE) IMPLANT
SUT SILK 2 0 PERMA HAND 18 BK (SUTURE) IMPLANT
SUT VIC AB 3-0 SH 27 (SUTURE) ×2
SUT VIC AB 3-0 SH 27X BRD (SUTURE) ×2 IMPLANT
TOWEL GREEN STERILE (TOWEL DISPOSABLE) ×2 IMPLANT
UNDERPAD 30X36 HEAVY ABSORB (UNDERPADS AND DIAPERS) ×2 IMPLANT
WATER STERILE IRR 1000ML POUR (IV SOLUTION) ×2 IMPLANT

## 2020-03-13 NOTE — Progress Notes (Signed)
All necessary records have been faxed to [place] [facility] to request outpatient HD seat for ESRD treatment at [facility] and Navigator called to leave message for Kim Atkins/Admissions Coordinator requesting a call back regarding referral.   Alphonzo Cruise, [place] Renal Navigator [phone]

## 2020-03-13 NOTE — Transfer of Care (Signed)
Immediate Anesthesia Transfer of Care Note  Patient: Barbara Crawford  Procedure(s) Performed: Creation of Left Brachiocephalic fistula (Left Arm Upper)  Patient Location: PACU  Anesthesia Type:MAC combined with regional for post-op pain  Level of Consciousness: awake, drowsy and patient cooperative  Airway & Oxygen Therapy: Patient Spontanous Breathing  Post-op Assessment: Report given to RN, Post -op Vital signs reviewed and stable and Patient moving all extremities X 4  Post vital signs: Reviewed and stable  Last Vitals:  Vitals Value Taken Time  BP 159/88 03/13/20 1137  Temp    Pulse 94 03/13/20 1138  Resp 26 03/13/20 1138  SpO2 99 % 03/13/20 1138  Vitals shown include unvalidated device data.  Last Pain:  Vitals:   03/13/20 0729  TempSrc: Oral  PainSc:       Patients Stated Pain Goal: 0 (53/20/23 3435)  Complications: No complications documented.

## 2020-03-13 NOTE — Progress Notes (Signed)
Hypoglycemic Event  CBG: 68  Treatment: 4 oz juice/soda (ginger ale)  Symptoms: None  Follow-up CBG: Time:1335 CBG Result: 82  Possible Reasons for Event: Inadequate meal intake (NPO for procedure this morning)    Barbara Crawford

## 2020-03-13 NOTE — Anesthesia Procedure Notes (Signed)
Anesthesia Regional Block: Supraclavicular block   Pre-Anesthetic Checklist: ,, timeout performed, Correct Patient, Correct Site, Correct Laterality, Correct Procedure, Correct Position, site marked, Risks and benefits discussed, pre-op evaluation,  At surgeon's request and post-op pain management  Laterality: Left  Prep: Maximum Sterile Barrier Precautions used, chloraprep       Needles:  Injection technique: Single-shot  Needle Type: Echogenic Stimulator Needle     Needle Length: 9cm  Needle Gauge: 22     Additional Needles:   Procedures:,,,, ultrasound used (permanent image in chart),,,,  Narrative:  Start time: 03/13/2020 9:50 AM End time: 03/13/2020 9:53 AM Injection made incrementally with aspirations every 5 mL.  Performed by: Personally  Anesthesiologist: Brennan Bailey, MD  Additional Notes: Risks, benefits, and alternative discussed. Patient gave consent for procedure. Patient prepped and draped in sterile fashion. Sedation administered, patient remains easily responsive to voice. Relevant anatomy identified with ultrasound guidance. Local anesthetic given in 5cc increments with no signs or symptoms of intravascular injection. No pain or paraesthesias with injection. Patient monitored throughout procedure with signs of LAST or immediate complications. Tolerated well. Ultrasound image placed in chart.  Tawny Asal, MD

## 2020-03-13 NOTE — TOC Progression Note (Signed)
Transition of Care [facility]) - Progression Note    Patient Details  Name: Barbara Crawford MRN: [id number] Date of Birth: 04-Feb-1955  Transition [facility] Care [facility]) CM/SW Contact  Joanne Chars, LCSW Phone Number: 03/13/2020, 3:46 PM  Clinical Narrative:  Medicaid/disability documents received from Santa Lighter.  Pt signed, emailed back to her.     Expected Discharge Plan: [place] Barriers to Discharge: Continued Medical Work up  Expected Discharge Plan and Services Expected Discharge Plan: [place] Choice: [place] arrangements for the past 2 months: Apartment                           HH Arranged: PT,OT [place]: [facility] Date [place]: 03/09/20 Time [place]: 1100 Representative spoke with at [place]: [place]   Social Determinants of Health ([place]) Interventions    Readmission Risk Interventions No flowsheet data found.

## 2020-03-13 NOTE — Progress Notes (Addendum)
2020 Temp 103, Tylenol 1000 mg po given at this time. Dr Cyd Silence was notified awaiting response, will cont to monitor, OOBTC with call bell within reach, CN aware.  2110 Orders received for CXR, blood cultures - same done.  2255 Temp decreased to 101.5, having loose brown stool.Marland Kitchen  0023 Temp decreased to 99.6, will cont to monitor.

## 2020-03-13 NOTE — Progress Notes (Signed)
Renal Navigator has not yet heard back from [facility] regarding whether or not they are in network with [facility], but had a message from patient this morning that she has contacted her insurance company, as recommended by Navigator, and learned that [employer] is in Network with [employer]  Navigator in process of making referral to [facility] Admissions Coordinator/Kim Atkins now to request a seat at [facility] for outpatient ESRD treatment. Navigator left patient a message to update.   Alphonzo Cruise, [place] Renal Navigator [phone]

## 2020-03-13 NOTE — Progress Notes (Signed)
Physical Therapy Treatment Patient Details Name: Barbara Crawford MRN: [id number] DOB: [birth year] Today's Date: 03/13/2020    History of Present Illness 65 y.o. female with medical history significant of HTN. Pt had COVID in Dec, 1 week prior to presenting to ED 02/25/20 she developed swelling in BLE. Developed pain in B lower back; CT abd/pelvis - no obstruction or acute abnormality. HGB 6.3, +acute renal failure; 2/23 seizure and Code Blue with CPR x 4 minutes and intubated 2/23-2/25; CRRT; renal biopsy 2/28 with perinephric hematoma; pneumonia;  IJ catheter placed and HD initiated. Plans to have dialysis access placed 3/11    PT Comments    Patient progressing slowly towards PT goals. Pt anxious about procedure today and wanting to eat/drink. Tolerated short distance ambulation with supervision for safety. Pt requires 4 standing rest breaks due to fatigue. RR up to 40 and HR up to 128 bpm during activity. Encouraged increasing activity while in the hospital especially walking to bathroom as able with nursing. Will follow up post procedure as pt needs to negotiate flights of stairs to enter home. Will follow.   Follow Up Recommendations  Home health PT;Supervision/Assistance - 24 hour (initial 24/7 for a few days)     Equipment Recommendations  Rolling walker with 5" wheels    Recommendations for Other Services       Precautions / Restrictions Precautions Precautions: Fall;Other (comment) Precaution Comments: Monitor HR and RR Restrictions Weight Bearing Restrictions: No    Mobility  Bed Mobility Overal bed mobility: Needs Assistance Bed Mobility: Supine to Sit;Sit to Supine     Supine to sit: Min assist;HOB elevated Sit to supine: Supervision   General bed mobility comments: Pt reaching up to get assist from nursing to get to EOB. Increased time. Able to bring LEs into bed to return to supine.    Transfers Overall transfer level: Needs assistance Equipment used:  Rolling walker (2 wheeled) Transfers: Sit to/from Stand Sit to Stand: Supervision         General transfer comment: Supervision for safety. Stood from [employer], cues for hand placement. Deferred tx to chair as pt going down to OR for procedure.  Ambulation/Gait Ambulation/Gait assistance: Supervision Gait Distance (Feet): 80 Feet Assistive device: Rolling walker (2 wheeled) Gait Pattern/deviations: Decreased stride length;Wide base of support;Step-through pattern Gait velocity: decr Gait velocity interpretation: <1.31 ft/sec, indicative of household ambulator General Gait Details: Slow, mostly steady gait with 4 standing rest breaks due to fatigue. RR up to 40, HR up to 128 bpm.   Stairs             Wheelchair Mobility    Modified Rankin (Stroke Patients Only)       Balance Overall balance assessment: Needs assistance Sitting-balance support: No upper extremity supported;Feet supported Sitting balance-Leahy Scale: Fair Sitting balance - Comments: ASsist to donn crocs   Standing balance support: During functional activity Standing balance-Leahy Scale: Fair Standing balance comment: Prefers UE support for walking, able to stand statically without UE support.                            Cognition Arousal/Alertness: Awake/alert Behavior During Therapy: Flat affect;Anxious Overall Cognitive Status: No family/caregiver present to determine baseline cognitive functioning Area of Impairment: Problem solving                             Problem Solving: Slow processing General  Comments: Patient answers therapists questions appropriately. Anxious about going down for procedure. "I have been here for 3 weeks." despite only being here for 2 weeks.      Exercises      General Comments General comments (skin integrity, edema, etc.): HR up to 128 bpm with activity, RR up to 40.      Pertinent Vitals/Pain Pain Assessment: Faces Faces Pain Scale:  Hurts a little bit Pain Location: generalized Pain Descriptors / Indicators: Sore Pain Intervention(s): Monitored during session;Limited activity within patient's tolerance    Home Living                      Prior Function            PT Goals (current goals can now be found in the care plan section) Progress towards PT goals: Progressing toward goals    Frequency    Min 3X/week      PT Plan Current plan remains appropriate    Co-evaluation              AM-PAC PT "6 Clicks" Mobility   Outcome Measure  Help needed turning from your back to your side while in a flat bed without using bedrails?: None Help needed moving from lying on your back to sitting on the side of a flat bed without using bedrails?: A Little Help needed moving to and from a bed to a chair (including a wheelchair)?: A Little Help needed standing up from a chair using your arms (e.g., wheelchair or bedside chair)?: A Little Help needed to walk in hospital room?: A Little Help needed climbing 3-5 steps with a railing? : A Little 6 Click Score: 19    End of Session   Activity Tolerance: Patient limited by fatigue Patient left: in bed;with call bell/phone within reach;with bed alarm set Nurse Communication: Mobility status PT Visit Diagnosis: Unsteadiness on feet (R26.81);Muscle weakness (generalized) (M62.81)     Time: [id number] PT Time Calculation (min) (ACUTE ONLY): 25 min  Charges:  $Gait Training: 8-22 mins $Therapeutic Activity: 8-22 mins                     Marisa Severin, PT, DPT Acute Rehabilitation Services Pager [phone] Office [place] 03/13/2020, 11:04 AM

## 2020-03-13 NOTE — Anesthesia Postprocedure Evaluation (Signed)
Anesthesia Post Note  Patient: Barbara Crawford  Procedure(s) Performed: Creation of Left Brachiocephalic fistula (Left Arm Upper)     Patient location during evaluation: PACU Anesthesia Type: MAC Level of consciousness: awake and alert and oriented Pain management: pain level controlled Vital Signs Assessment: post-procedure vital signs reviewed and stable Respiratory status: spontaneous breathing, nonlabored ventilation and respiratory function stable Cardiovascular status: blood pressure returned to baseline Postop Assessment: no apparent nausea or vomiting Anesthetic complications: no   No complications documented.  Last Vitals:  Vitals:   03/13/20 1200 03/13/20 1206  BP:  (!) 171/90  Pulse: 94 95  Resp: (!) 22 20  Temp:  37.4 C  SpO2: 97% 96%    Last Pain:  Vitals:   03/13/20 1206  TempSrc:   PainSc: [place]

## 2020-03-13 NOTE — Op Note (Addendum)
OPERATIVE NOTE   PROCEDURE: left brachiocephalic arteriovenous fistula placement with thrombectomy of cephalic vein  PRE-OPERATIVE DIAGNOSIS: ESRD  POST-OPERATIVE DIAGNOSIS: same as above   SURGEON: Marty Heck, MD  ASSISTANT(S): Paulo Fruit, PA  ANESTHESIA: regional  ESTIMATED BLOOD LOSS: Minimal  FINDING(S): 1.  Cephalic vein: 4.5 mm, acceptable 2.  Brachial artery: 4 mm, atherosclerotic disease evident 3.  Venous outflow: palpable thrill  4.  Radial flow: palpable radial pulse  SPECIMEN(S):  none  INDICATIONS:   Barbara Crawford is a 65 y.o. female who presents with ESRD and need for permanent hemodialysis access.  The patient is scheduled for left arteriovenous fistula versus graft placement.  The patient is aware the risks include but are not limited to: bleeding, infection, steal syndrome, nerve damage, ischemic monomelic neuropathy, failure to mature, and need for additional procedures.  The patient is aware of the risks of the procedure and elects to proceed forward.  An assistant was needed for exposure and to expedite the case.   DESCRIPTION: After full informed written consent was obtained from the patient, the patient was brought back to the operating room and placed supine upon the operating table.  Prior to induction, the patient received IV antibiotics.   After obtaining adequate anesthesia, the patient was then prepped and draped in the standard fashion for a left arm access procedure.  I turned my attention first to identifying the patient's cephalic vein and this looked much larger than the vein mapping suggested.  Using SonoSite guidance, the location of the cephalic vein and brachial artery were marked out on the skin.      I made a transverse incision at the level of the antecubitum and dissected through the subcutaneous tissue and fascia to gain exposure of the brachial artery.  This was noted to be 4 mm in diameter externally.  This was  dissected out proximally and distally and controlled with vessel loops .  I then dissected out the cephalic vein.  This was noted to be 4.5 mm in diameter externally.  The distal segment of the vein was ligated with a  2-0 silk, and the vein was transected.  I did pass a #2 fogarty in the cephalic vein to retrieve small amount of thombus likely from IV placement.  The proximal segment was interrogated with serial dilators.  The vein accepted up to a 5 mm dilator without any difficulty.  I then instilled the heparinized saline into the vein and clamped it.  At this point, I reset my exposure of the brachial artery.  Patient was given 3000 units IV heparin.  I then placed the artery under tension proximally and distally.  I made an arteriotomy with a #11 blade, and then I extended the arteriotomy with a Potts scissor.  I injected heparinized saline proximal and distal to this arteriotomy.  The vein was then sewn to the artery in an end-to-side configuration with a running stitch of 6-0 Prolene.  Prior to completing this anastomosis, I allowed the vein and artery to backbleed.  There was no evidence of clot from any vessels.  I completed the anastomosis in the usual fashion and then released all vessel loops and clamps.    There was a palpable thrill in the venous outflow, and there was a palpable radial pulse.  At this point, I irrigated out the surgical wound.  There was no further active bleeding.  The subcutaneous tissue was reapproximated with a running stitch of 3-0 Vicryl.  The skin was then reapproximated with a running subcuticular stitch of 4-0 Monocryl.  The skin was then cleaned, dried, and reinforced with Dermabond.  The patient tolerated this procedure well.    COMPLICATIONS: None  CONDITION: Stable   Marty Heck, MD [facility] Office: [place]   03/13/2020, 11:39 AM

## 2020-03-13 NOTE — Progress Notes (Signed)
PROGRESS NOTE    Barbara Crawford   TGY:[id number]  DOB: 1955-01-22  DOA: 02/25/2020     17  PCP: Vonna Drafts, FNP  Brief narrative/Hospital Course: Barbara Crawford is a 65 yo female with past medical history of hypertension, prior Covid (Dec 2021) presented to the hospital with complaints of lower extremity swelling, back pain, generalized malaise, fatigue at home. In the ED, she was found to have a hemoglobin of 6.3, potassium 5.4, sodium 130, bicarb 9, BUN 144, and creatinine 29.98.  CT of the abdomen and pelvis was negative for obstruction or acute abnormality.  Temporary hemodialysis catheter was placed by IR and subsequently, the patient had a PEA arrest associated with seizure-like activity.  She was intubated and subsequently extubated 02/28/2020.   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 Renal biopsy done 03/03/2019.  ANA positive.  ID followed the patient for fever.Trialysis cath was removed on 03/06/20 and subsequently underwent permacath placement.  At this time, patient is awaiting for CLIP for hemodialysis.  PT following.  Likely need home health on discharge.  Assessment & Plan:  Principal Problem:   Acute renal failure (HCC) Active Problems:   Normocytic anemia   Uremia   HTN (hypertension)   Perinephric hematoma   Seizure (HCC)   Lobar pneumonia (HCC)   Sepsis (HCC) versus SIRS due to autoimmune process   Fever   Glomerulonephritis   ABLA (acute blood loss anemia)   Pleural effusion   Encounter for orogastric (OG) tube placement   FSGS (focal segmental glomerulosclerosis)   Oliguric acute kidney injury > ESRD  Patient presented with elevation of BUN (144) and creat (29.98).  Renal ultrasound showed echogenic kidneys. CT renal stone study showed non-obstructive right nephrolithiasis. Patient was found to have nephrotic range proteinuria. ANA positive, no M spike detected on protein electrophoresis, ASO negative, C3 117,  C4 35, ANCA negative,GBM antibody negative.RPR non-reactive.  dsDNA negative, SCL 70 Positive+ (1.9), SSA IgG > 8+, SSB normal,  Chromatin antibody positive.     Status post renal biopsy 03/02/2020 with findings of focal segmental globular sclerosis..  Developed a perinephric hematoma post biopsy.    Currently undergoing hemodialysis.  Will likely need long-term dialysis, status post IR guided internal jugular tunneled hemodialysis catheter on 03/09/2020.  Awaiting for outpatient hemodialysis arrangement.  Pleural effusion Continue hemodialysis for volume management.  Patient does have hypoalbuminemia as well.  Moderate sized pleural effusion on CT on 3/4; no obvious HF on echo (EF 50-55%).  Received Lasix and albumin as per nephrology.  No respiratory distress at this time, on room air  Acute blood loss anemia Stable at this time.  Continue to monitor hemoglobin.  Latest hemoglobin of 9.2  Acute glomerulonephritis Status post renal biopsy which showed a focal segmental glomerulosclerosis.    Fever Infectious disease was consulted.  Currently off antibiotic.  Had dialysis catheter which has removed and has received a new permacath.  Had been on antibiotic recently.  CT chest with effusion atelectasis.  Duplex ultrasound was negative for DVT.  Continue incentive spirometry.  T-max of 100.1 F.  WBC of 10.1.  Check labs in a.m.  Lobar pneumonia  Status post extubation 02/28/2020.  Procalcitonin was elevated.  Completed course of antibiotic.  Currently off antibiotic.  CT chest on 03/06/2020 showed bilateral effusion and atelectasis  Seizure  Patient had seizure with lossof pulse and PEA arrest s/p CPR.  Was seen by neurology and the plan is to continue  Keppra 500 mg twice a day  Perinephric hematoma Seen on CT abdomen/pelvis on 03/03/2020.  Continue to monitor closely.  Hemoglobin has remained stable at this time. CBC Latest Ref Rng & Units 03/13/2020 03/12/2020 03/11/2020  WBC 4.0 - 10.5 K/uL - 10.1  9.2  Hemoglobin 12.0 - 15.0 g/dL 8.8(L) 9.2(L) 8.4(L)  Hematocrit 36.0 - 46.0 % 26.0(L) 28.2(L) 24.5(L)  Platelets 150 - 400 K/uL - 298 232    Essential hypertension Continue with nifedipine, metoprolol and labetalol as needed.    Uremia Resolved.  Likely secondary to to AKI.  Continue hemodialysis.  Normocytic anemia Status post kidney biopsy and perinephric hematoma.  Status post 2 units of packed RBC on 03/03/2020.  Continue Aranesp as per nephrology.  Recent hemoglobin of 8.8.  Asymptomatic bacteriuria.  Acute on chronic respiratory failure with hypoxia  Resolved.  Status post PEA arrest.  Extubated on 02/28/2020.  Now on room air  Cardiac arrest with pulseless electrical activity  Status post CPR and intubation.  Stable at this time.  Debility, deconditioning,  Will need home PT on discharge.  Antimicrobials: None at this time.  DVT prophylaxis:      Code Status: Full Code   Family Communication:  None.  Disposition Plan: Status is: Inpatient  Remains inpatient appropriate because:, IV treatments appropriate due to intensity of illness or inability to take PO and Inpatient level of care appropriate due to severity of illness, will need hemodialysis set up as outpatient  Dispo:  Patient From: Home  Planned Disposition: Home with Health Care Svc   Anticipated date of discharge.  1 to 2 days, when OP HD is setup.  Medically stable for discharge: No  Subjective Patient was seen and examined at bedside.  Denies any nausea vomiting  shortness of breath today.  Low-grade temperature noted.  Denies any nausea vomiting.  Objective:  Vitals with BMI 03/13/2020 03/12/2020 03/12/2020  Height - - -  Weight - - -  BMI - - -  Systolic 628 638 177  Diastolic 99 99 95  Pulse 116 - 95   Body mass index is 28.91 kg/m.  Physical Examination: General:  Average built, not in obvious distress, alert awake and oriented HENT:   No scleral pallor or icterus noted. Oral mucosa is  moist.  Right internal jugular permacath in place Chest:  .  Diminished breath sounds bilaterally. No crackles or wheezes.  CVS: S1 &S2 heard. No murmur.  Regular rate and rhythm. Abdomen: Soft, nontender, nondistended.  Bowel sounds are heard.   Extremities: No cyanosis, clubbing but with bilateral pitting edema, peripheral pulses are palpable. Psych: Alert, awake and oriented, normal mood CNS:  No cranial nerve deficits.  Power equal in all extremities.   Skin: Warm and dry.  No rashes noted.  Consultants:   ID  Nephrology  Procedures:    Hemodialysis   internal jugular tunneled hemodialysis catheter on 03/09/2020.  Data Reviewed: I have personally reviewed following labs and imaging studies  Results for orders placed or performed during the hospital encounter of 02/25/20 (from the past 24 hour(s))  Glucose, capillary     Status: None   Collection Time: 03/12/20 11:44 AM  Result Value Ref Range   Glucose-Capillary 83 70 - 99 mg/dL  Glucose, capillary     Status: None   Collection Time: 03/12/20  4:16 PM  Result Value Ref Range   Glucose-Capillary 93 70 - 99 mg/dL  Glucose, capillary     Status: None   Collection  Time: 03/12/20  8:41 PM  Result Value Ref Range   Glucose-Capillary 95 70 - 99 mg/dL  Glucose, capillary     Status: None   Collection Time: 03/13/20  7:26 AM  Result Value Ref Range   Glucose-Capillary 77 70 - 99 mg/dL  I-STAT, chem 8     Status: Abnormal   Collection Time: 03/13/20 10:07 AM  Result Value Ref Range   Sodium 134 (L) 135 - 145 mmol/L   Potassium 3.6 3.5 - 5.1 mmol/L   Chloride 97 (L) 98 - 111 mmol/L   BUN 24 (H) 8 - 23 mg/dL   Creatinine, Ser 6.00 (H) 0.44 - 1.00 mg/dL   Glucose, Bld 92 70 - 99 mg/dL   Calcium, Ion 1.04 (L) 1.15 - 1.40 mmol/L   TCO2 26 22 - 32 mmol/L   Hemoglobin 8.8 (L) 12.0 - 15.0 g/dL   HCT 26.0 (L) 36.0 - 46.0 %    Recent Results (from the past 240 hour(s))  Urine Culture     Status: None   Collection Time: 03/04/20   5:30 AM   Specimen: Urine, Random  Result Value Ref Range Status   Specimen Description URINE, RANDOM  Final   Special Requests STERILE CUP  Final   Culture   Final    NO GROWTH Performed at [facility], [address], [place], [place] [address]    Report Status 03/05/2020 FINAL  Final  Culture, blood (routine x 2)     Status: None   Collection Time: 03/04/20  8:48 PM   Specimen: BLOOD  Result Value Ref Range Status   Specimen Description BLOOD LEFT ANTECUBITAL  Final   Special Requests   Final    BOTTLES DRAWN AEROBIC AND ANAEROBIC Blood Culture results may not be optimal due to an inadequate volume of blood received in culture bottles   Culture   Final    NO GROWTH 5 DAYS Performed at [facility], [place] [address], [place], [place] [address]    Report Status 03/09/2020 FINAL  Final  Culture, blood (routine x 2)     Status: None   Collection Time: 03/04/20  8:48 PM   Specimen: BLOOD LEFT HAND  Result Value Ref Range Status   Specimen Description BLOOD LEFT HAND  Final   Special Requests   Final    BOTTLES DRAWN AEROBIC AND ANAEROBIC Blood Culture results may not be optimal due to an inadequate volume of blood received in culture bottles   Culture   Final    NO GROWTH 5 DAYS Performed at [facility], [place] [address] [place], [place] [address]    Report Status 03/09/2020 FINAL  Final     Radiology Studies: No results found. VAS [place] UPPER EXT VEIN MAPPING (PRE-OP AVF)  Final Result    IR Fluoro Guide CV Line Right  Final Result    IR US Guide Vasc Access Right  Final Result    VAS [place] LOWER EXTREMITY VENOUS (DVT)  Final Result    VAS [place] UPPER EXTREMITY VENOUS DUPLEX  Final Result    CT CHEST WO CONTRAST  Final Result    CT ABDOMEN PELVIS WO CONTRAST  Final Result    US BIOPSY (KIDNEY)  Final Result    DG Chest 2 View  Final Result    DG CHEST PORT 1 VIEW  Final Result    MR BRAIN WO CONTRAST  Final Result    MR ANGIO HEAD WO  CONTRAST  Final Result    CT HEAD WO CONTRAST  Final Result    DG Abd 1 View  Final Result    DG CHEST PORT 1 VIEW  Final Result    DG Chest Port 1 View  Final Result    IR Fluoro Guide CV Line Right  Final Result    IR US Guide Vasc Access Right  Final Result    US RENAL  Final Result    CT Renal Stone Study  Final Result      Scheduled Meds: . [MAR Hold] chlorhexidine gluconate (MEDLINE KIT)  15 mL Mouth Rinse BID  . [MAR Hold] Chlorhexidine Gluconate Cloth  6 each Topical Q0600  . [MAR Hold] dextromethorphan  30 mg Oral BID  . [MAR Hold] docusate sodium  100 mg Oral BID  . [MAR Hold] feeding supplement  237 mL Oral TID BM  . [MAR Hold] levETIRAcetam  250 mg Oral Q T,Th,Sa-HD  . [MAR Hold] levETIRAcetam  500 mg Oral BID  . [MAR Hold] mouth rinse  15 mL Mouth Rinse BID  . [MAR Hold] metoprolol tartrate  50 mg Oral BID  . [MAR Hold] multivitamin  1 tablet Oral QHS  . [MAR Hold] NIFEdipine  90 mg Oral Daily   PRN Meds: 0.9 % irrigation (POUR BTL), [MAR Hold] acetaminophen, [MAR Hold] guaiFENesin, heparin irrigation 6000 unit, [MAR Hold] labetalol, [MAR Hold] LORazepam, [MAR Hold] melatonin, [MAR Hold] ondansetron **OR** [MAR Hold] ondansetron (ZOFRAN) IV, [MAR Hold] oxyCODONE Continuous Infusions: . sodium chloride 10 mL/hr at 03/13/20 0856     LOS: 17 days    Flora Lipps, MD [facility] 03/13/2020, 11:05 AM

## 2020-03-13 NOTE — Progress Notes (Signed)
[facility] [place]  Subjective  - no immediate concerns.   Objective (!) 156/99 (!) 114 99.1 F (37.3 C) (Oral) 20 97%  Intake/Output Summary (Last 24 hours) at 03/13/2020 0931 Last data filed at 03/12/2020 1110 Gross per 24 hour  Intake --  Output 3000 ml  Net -3000 ml    RIJ tunneled TDC Left radial pulse palpable  Laboratory Lab Results: Recent Labs    03/11/20 0206 03/12/20 0019  WBC 9.2 10.1  HGB 8.4* 9.2*  HCT 24.5* 28.2*  PLT 232 298   BMET Recent Labs    03/11/20 0206 03/12/20 0019  NA 133* 132*  K 3.5 3.6  CL 99 97*  CO2 23 22  GLUCOSE 93 104*  BUN 32* 42*  CREATININE 6.59* 8.02*  CALCIUM 7.7* 7.9*    COAG Lab Results  Component Value Date   INR 1.2 03/05/2020   INR 1.0 02/26/2020   No results found for: PTT  Assessment/Planning:  Plan left arm AV fistula versus graft.  Discussed with patient that her vein mapping suggests no usable surface vein but will reevaluate in the OR.  Likely will be upper arm graft.  She does have a palpable radial pulse at the wrist.  Risk benefits discussed.  Marty Heck 03/13/2020 9:31 AM --

## 2020-03-13 NOTE — Progress Notes (Signed)
OT Cancellation Note  Patient Details Name: Barbara Crawford MRN: [id number] DOB: 1955-08-22   Cancelled Treatment:    Reason Eval/Treat Not Completed: Patient at procedure or test/ unavailable. OT to check back as time allows.   [name] OTR/L Supplemental OT, Department of rehab services [phone]  [name] R H. 03/13/2020, 9:21 AM

## 2020-03-13 NOTE — Addendum Note (Signed)
Addendum  created 03/13/20 1406 by Gaylene Brooks, CRNA   Intraprocedure Event edited

## 2020-03-13 NOTE — Progress Notes (Signed)
NoPatient ID: Barbara Crawford, female   DOB: 11-21-1955, 65 y.o.   MRN: [id number] S:.  Patient underwent AVG creation today.  Tolerated dialysis yesterday with no significant issues.  Tolerating ultrafiltration O:BP (!) 171/90 (BP Location: Right Arm)   Pulse 95   Temp 99.4 F (37.4 C)   Resp 20   Ht 5' 2.99" (1.6 m)   Wt 74 kg   SpO2 96%   BMI 28.91 kg/m   Intake/Output Summary (Last 24 hours) at 03/13/2020 1359 Last data filed at 03/13/2020 1138 Gross per 24 hour  Intake 200 ml  Output 25 ml  Net 175 ml   Intake/Output: I/O last 3 completed shifts: In: -  Out: 3000 [Other:3000]  Intake/Output this shift:  Total I/O In: 200 [I.V.:200] Out: 25 [Blood:25] Weight change:  Gen: NAD CVS: Normal rate Resp: Lateral chest rise Abd: +BS, soft, NT/ND Ext: 2+ pitting edema in the bilateral lower extremities  Recent Labs  Lab 03/07/20 0110 03/08/20 0046 03/09/20 0150 03/10/20 0025 03/11/20 0206 03/12/20 0019 03/13/20 1007  NA 132* 133* 131* 131* 133* 132* 134*  K 3.5 3.6 3.5 3.7 3.5 3.6 3.6  CL 98 98 96* 98 99 97* 97*  CO2 _0 18* 23 22  --   GLUCOSE 85 89 104* 87 93 104* 92  BUN 18 35* 46* 56* 32* 42* 24*  CREATININE 4.47* 6.48* 8.33* 9.41* 6.59* 8.02* 6.00*  ALBUMIN  --   --  1.7* 1.8* 2.1* 2.2*  --   CALCIUM 7.1* 7.1* 7.5* 7.6* 7.7* 7.9*  --   PHOS 3.6 4.3 5.0* 5.7* 3.8 4.2  --    Liver Function Tests: Recent Labs  Lab 03/10/20 0025 03/11/20 0206 03/12/20 0019  ALBUMIN 1.8* 2.1* 2.2*   No results for input(s): LIPASE, AMYLASE in the last 168 hours. No results for input(s): AMMONIA in the last 168 hours. CBC: Recent Labs  Lab 03/08/20 0046 03/08/20 1231 03/09/20 0150 03/10/20 0025 03/11/20 0206 03/12/20 0019 03/13/20 1007  WBC 11.8*  --  9.9 10.1 9.2 10.1  --   NEUTROABS 9.0*  --  7.7 8.0* 6.7  --   --   HGB 6.6*   < > 8.8* 8.7* 8.4* 9.2* 8.8*  HCT 20.0*   < > 26.4* 26.0* 24.5* 28.2* 26.0*  MCV 88.5  --  89.2 89.0 88.1 90.1  --   PLT 278   --  301 293 232 298  --    < > = values in this interval not displayed.   Cardiac Enzymes: No results for input(s): CKTOTAL, CKMB, CKMBINDEX, TROPONINI in the last 168 hours. CBG: Recent Labs  Lab 03/12/20 1616 03/12/20 2041 03/13/20 0726 03/13/20 1241 03/13/20 1336  GLUCAP 93 95 77 68* 82    Iron Studies: No results for input(s): IRON, TIBC, TRANSFERRIN, FERRITIN in the last 72 hours. Studies/Results: No results found. . chlorhexidine gluconate (MEDLINE KIT)  15 mL Mouth Rinse BID  . Chlorhexidine Gluconate Cloth  6 each Topical Q0600  . dextromethorphan  30 mg Oral BID  . docusate sodium  100 mg Oral BID  . feeding supplement  237 mL Oral TID BM  . levETIRAcetam  250 mg Oral Q T,Th,Sa-HD  . levETIRAcetam  500 mg Oral BID  . mouth rinse  15 mL Mouth Rinse BID  . metoprolol tartrate  50 mg Oral BID  . multivitamin  1 tablet Oral QHS  . NIFEdipine  90 mg Oral Daily    BMET  Component Value Date/Time   NA 134 (L) 03/13/2020 1007   K 3.6 03/13/2020 1007   CL 97 (L) 03/13/2020 1007   CO2 22 03/12/2020 0019   GLUCOSE 92 03/13/2020 1007   BUN 24 (H) 03/13/2020 1007   CREATININE 6.00 (H) 03/13/2020 1007   CALCIUM 7.9 (L) 03/12/2020 0019   CALCIUM 6.2 (LL) 02/26/2020 0305   GFRNONAA 5 (L) 03/12/2020 0019   GFRAA >60 01/28/2016 1543   CBC    Component Value Date/Time   WBC 10.1 03/12/2020 0019   RBC 3.13 (L) 03/12/2020 0019   HGB 8.8 (L) 03/13/2020 1007   HCT 26.0 (L) 03/13/2020 1007   PLT 298 03/12/2020 0019   MCV 90.1 03/12/2020 0019   MCH 29.4 03/12/2020 0019   MCHC 32.6 03/12/2020 0019   RDW 15.3 03/12/2020 0019   LYMPHSABS 0.9 03/11/2020 0206   MONOABS 1.4 (H) 03/11/2020 0206   EOSABS 0.1 03/11/2020 0206   BASOSABS 0.1 03/11/2020 0206    Assessment/Plan:  1. 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 Given the biopsy results I think we can call her ESRD. 1. Continue HD per TTS schedule 2. Underwent AVG today.  Appreciate help from Dr. Carlis Abbott 3. Undergoing CLIP process; issues with insurance 2. PEA arrest after temp HD catheter placed. Resolved and extubated.  Doing well 3. ID -low-grade fevers and tachycardia.  Work-up negative for infection.  Now improved 4. Hypertension: BP slightly high.  Has improved with dialysis.  Continue to monitor 5. Anemia: Multifactorial.   Iron studies likely not accurate at this time given recent transfusion.  Consider Aranesp and obtaining iron studies in a week or 2. 6. Seizure activity - neurology consulted and felt to be related to metabolic derangements. On keppra per neuro.

## 2020-03-14 ENCOUNTER — Encounter (HOSPITAL_COMMUNITY): Payer: Self-pay | Admitting: Vascular Surgery

## 2020-03-14 DIAGNOSIS — I468 Cardiac arrest due to other underlying condition: Secondary | ICD-10-CM | POA: Diagnosis not present

## 2020-03-14 DIAGNOSIS — Z8616 Personal history of COVID-19: Secondary | ICD-10-CM | POA: Diagnosis not present

## 2020-03-14 DIAGNOSIS — N179 Acute kidney failure, unspecified: Secondary | ICD-10-CM | POA: Diagnosis not present

## 2020-03-14 DIAGNOSIS — J9621 Acute and chronic respiratory failure with hypoxia: Secondary | ICD-10-CM | POA: Diagnosis not present

## 2020-03-14 DIAGNOSIS — G928 Other toxic encephalopathy: Secondary | ICD-10-CM | POA: Diagnosis not present

## 2020-03-14 DIAGNOSIS — N001 Acute nephritic syndrome with focal and segmental glomerular lesions: Secondary | ICD-10-CM | POA: Diagnosis not present

## 2020-03-14 DIAGNOSIS — R8271 Bacteriuria: Secondary | ICD-10-CM | POA: Diagnosis not present

## 2020-03-14 DIAGNOSIS — D62 Acute posthemorrhagic anemia: Secondary | ICD-10-CM | POA: Diagnosis not present

## 2020-03-14 LAB — CBC
HCT: 26.7 % — ABNORMAL LOW (ref 36.0–46.0)
Hemoglobin: 8.7 g/dL — ABNORMAL LOW (ref 12.0–15.0)
MCH: 29.4 pg (ref 26.0–34.0)
MCHC: 32.6 g/dL (ref 30.0–36.0)
MCV: 90.2 fL (ref 80.0–100.0)
Platelets: 269 10*3/uL (ref 150–400)
RBC: 2.96 MIL/uL — ABNORMAL LOW (ref 3.87–5.11)
RDW: 15 % (ref 11.5–15.5)
WBC: 10.3 10*3/uL (ref 4.0–10.5)
nRBC: 0 % (ref 0.0–0.2)

## 2020-03-14 LAB — URINALYSIS, COMPLETE (UACMP) WITH MICROSCOPIC
Bilirubin Urine: NEGATIVE
Glucose, UA: 50 mg/dL — AB
Ketones, ur: 5 mg/dL — AB
Leukocytes,Ua: NEGATIVE
Nitrite: NEGATIVE
Protein, ur: >=300 mg/dL — AB
Specific Gravity, Urine: 1.023 (ref 1.005–1.030)
pH: 8 (ref 5.0–8.0)

## 2020-03-14 LAB — BASIC METABOLIC PANEL
Anion gap: 12 (ref 5–15)
BUN: 30 mg/dL — ABNORMAL HIGH (ref 8–23)
CO2: 24 mmol/L (ref 22–32)
Calcium: 7.7 mg/dL — ABNORMAL LOW (ref 8.9–10.3)
Chloride: 96 mmol/L — ABNORMAL LOW (ref 98–111)
Creatinine, Ser: 7.16 mg/dL — ABNORMAL HIGH (ref 0.44–1.00)
GFR, Estimated: 6 mL/min — ABNORMAL LOW (ref >60)
Glucose, Bld: 90 mg/dL (ref 70–99)
Potassium: 3.9 mmol/L (ref 3.5–5.1)
Sodium: 132 mmol/L — ABNORMAL LOW (ref 135–145)

## 2020-03-14 LAB — GLUCOSE, CAPILLARY
Glucose-Capillary: 68 mg/dL — ABNORMAL LOW (ref 70–99)
Glucose-Capillary: 80 mg/dL (ref 70–99)
Glucose-Capillary: 98 mg/dL (ref 70–99)

## 2020-03-14 LAB — MAGNESIUM: Magnesium: 1.8 mg/dL (ref 1.7–2.4)

## 2020-03-14 LAB — PROCALCITONIN: Procalcitonin: >150 ng/mL

## 2020-03-14 MED ORDER — ACETAMINOPHEN 325 MG PO TABS
325.0000 mg | ORAL_TABLET | Freq: Once | ORAL | Status: AC
  Administered 2020-03-14: 325 mg via ORAL
  Filled 2020-03-14: qty 1

## 2020-03-14 MED ORDER — HEPARIN SODIUM (PORCINE) 1000 UNIT/ML IJ SOLN
INTRAMUSCULAR | Status: AC
  Administered 2020-03-14: 3200 [IU]
  Filled 2020-03-14: qty 4

## 2020-03-14 MED ORDER — VANCOMYCIN HCL IN DEXTROSE 750-5 MG/150ML-% IV SOLN
750.0000 mg | INTRAVENOUS | Status: AC
  Administered 2020-03-19: 750 mg via INTRAVENOUS
  Filled 2020-03-14 (×2): qty 150

## 2020-03-14 MED ORDER — METOPROLOL TARTRATE 50 MG PO TABS
75.0000 mg | ORAL_TABLET | Freq: Two times a day (BID) | ORAL | Status: DC
  Administered 2020-03-14 – 2020-04-03 (×33): 75 mg via ORAL
  Filled 2020-03-14 (×39): qty 1

## 2020-03-14 MED ORDER — SODIUM CHLORIDE 0.9 % IV SOLN
2.0000 g | INTRAVENOUS | Status: AC
  Administered 2020-03-14 – 2020-03-19 (×2): 2 g via INTRAVENOUS
  Filled 2020-03-14 (×4): qty 2

## 2020-03-14 MED ORDER — MENTHOL 3 MG MT LOZG
1.0000 | LOZENGE | OROMUCOSAL | Status: DC | PRN
  Administered 2020-03-14 – 2020-03-17 (×4): 3 mg via ORAL
  Filled 2020-03-14 (×2): qty 9

## 2020-03-14 MED ORDER — METOPROLOL TARTRATE 25 MG PO TABS
25.0000 mg | ORAL_TABLET | Freq: Once | ORAL | Status: AC
  Administered 2020-03-14: 25 mg via ORAL
  Filled 2020-03-14 (×2): qty 1

## 2020-03-14 MED ORDER — VANCOMYCIN HCL IN DEXTROSE 1-5 GM/200ML-% IV SOLN
1000.0000 mg | Freq: Once | INTRAVENOUS | Status: AC
  Administered 2020-03-14: 1000 mg via INTRAVENOUS
  Filled 2020-03-14: qty 200

## 2020-03-14 NOTE — Progress Notes (Signed)
° ° °  Subjective  - POD #1  C/o incisional pain and cough   Physical Exam:  Excellent grip strength in right hand Palpable fistula thrill Palpable radial pulse       Assessment/Plan:  POD #1 Fistula functioning well, without signs of steal F/u VVS in 4-6 weeks  Barbara Crawford 03/14/2020 9:14 AM --  Vitals:   03/14/20 0730 03/14/20 0800  BP: (!) 144/71 (!) 148/85  Pulse: 97 97  Resp: (!) 25 (!) 33  Temp:    SpO2: 98% 99%    Intake/Output Summary (Last 24 hours) at 03/14/2020 0914 Last data filed at 03/13/2020 1300 Gross per 24 hour  Intake 480.67 ml  Output 25 ml  Net 455.67 ml     Laboratory CBC    Component Value Date/Time   WBC 10.3 03/14/2020 0133   HGB 8.7 (L) 03/14/2020 0133   HCT 26.7 (L) 03/14/2020 0133   PLT 269 03/14/2020 0133    BMET    Component Value Date/Time   NA 132 (L) 03/14/2020 0133   K 3.9 03/14/2020 0133   CL 96 (L) 03/14/2020 0133   CO2 24 03/14/2020 0133   GLUCOSE 90 03/14/2020 0133   BUN 30 (H) 03/14/2020 0133   CREATININE 7.16 (H) 03/14/2020 0133   CALCIUM 7.7 (L) 03/14/2020 0133   CALCIUM 6.2 (LL) 02/26/2020 0305   GFRNONAA 6 (L) 03/14/2020 0133   GFRAA >60 01/28/2016 1543    COAG Lab Results  Component Value Date   INR 1.2 03/05/2020   INR 1.0 02/26/2020   No results found for: PTT  Antibiotics Anti-infectives (From admission, onward)   Start     Dose/Rate Route Frequency Ordered Stop   03/14/20 0600  vancomycin (VANCOREADY) IVPB 1000 mg/200 mL        1,000 mg 200 mL/hr over 60 Minutes Intravenous To Short Stay 03/13/20 0712 03/13/20 0954   03/09/20 0600  vancomycin (VANCOREADY) IVPB 1000 mg/200 mL        1,000 mg 200 mL/hr over 60 Minutes Intravenous On call 03/06/20 1647 03/09/20 1542   03/06/20 1200  vancomycin (VANCOCIN) IVPB 750 mg/150 ml premix  Status:  Discontinued        750 mg 150 mL/hr over 60 Minutes Intravenous  Once 03/05/20 1426 03/05/20 1447   03/05/20 1800  vancomycin (VANCOCIN) IVPB 750  mg/150 ml premix  Status:  Discontinued        750 mg 150 mL/hr over 60 Minutes Intravenous  Once 03/05/20 0831 03/05/20 0834   03/05/20 0930  vancomycin (VANCOREADY) IVPB 1500 mg/300 mL  Status:  Discontinued        1,500 mg 150 mL/hr over 120 Minutes Intravenous  Once 03/05/20 0831 03/05/20 1447   03/05/20 0930  ceFEPIme (MAXIPIME) 1 g in sodium chloride 0.9 % 100 mL IVPB  Status:  Discontinued        1 g 200 mL/hr over 30 Minutes Intravenous Every 24 hours 03/05/20 0831 03/05/20 1447   02/29/20 1000  cefTRIAXone (ROCEPHIN) 2 g in sodium chloride 0.9 % 100 mL IVPB  Status:  Discontinued        2 g 200 mL/hr over 30 Minutes Intravenous Every 24 hours 02/29/20 0910 03/05/20 0756       V. Leia Alf, M.D., [facility] [place] Office: [phone] Pager:  [phone]

## 2020-03-14 NOTE — Progress Notes (Signed)
Pharmacy Antibiotic Note  Barbara Crawford is a 65 y.o. female to begin broader spectrum antibiotics for PNA.  New HD patient.  Pt was started on vanc/cefepime on 3/3 before they were dced. She has recurrent fever with CXR showing increase consolidation and effusion. Vanc and cefepime have been ordered to be restarted. She was MRSA PCR neg previously. We will check again today. AVF has been placed and she is now on TTS HD schedule. She did get one dose of vanc 3/11 for the AVF placement.   Target pre-HD vanc: 15-25  Plan: Vancomycin 1g IV x 1 now then 750 after HD sessions Cefepime 2g IV TTS  Height: 5' 2.99" (160 cm) Weight: 71.2 kg (156 lb 15.5 oz) IBW/kg (Calculated) : 52.38  Temp (24hrs), Avg:100.8 F (38.2 C), Min:99.4 F (37.4 C), Max:103 F (39.4 C)  Recent Labs  Lab 03/09/20 0150 03/10/20 0025 03/11/20 0206 03/12/20 0019 03/13/20 1007 03/14/20 0133  WBC 9.9 10.1 9.2 10.1  --  10.3  CREATININE 8.33* 9.41* 6.59* 8.02* 6.00* 7.16*    Estimated Creatinine Clearance: 7.6 mL/min (A) (by C-G formula based on SCr of 7.16 mg/dL (H)).    Allergies  Allergen Reactions  . Erythromycin Other (See Comments)    Pt does not recall  . Iodine Other (See Comments)    Arm flares up per pt  . Penicillins     Has patient had a PCN reaction causing immediate rash, facial/tongue/throat swelling, SOB or lightheadedness with hypotension: no Has patient had a PCN reaction causing severe rash involving mucus membranes or skin necrosis: yes Has patient had a PCN reaction that required hospitalization: no Has patient had a PCN reaction occurring within the last 10 years: no If all of the above answers are "NO", then may proceed with Cephalosporin use.  Marland Kitchen Shell[employer]-Derived Products    Ceftriaxone 2/26>>3/3 Vancomycin 3/3>3/3 3/12>> Cefepime 3/3>3/3 3/12>>  2/23 MRSA PCR neg 2/23 Bcx ngtdF 2/23 TA neg 2/23 Ucx negF 3/12 blood>> 3/12 urine>>  Onnie Boer, PharmD, [place], AAHIVP,  CPP Infectious Disease Pharmacist 03/14/2020 10:24 AM

## 2020-03-14 NOTE — Progress Notes (Signed)
NoPatient ID: Barbara Crawford, female   DOB: 1955/10/16, 65 y.o.   MRN: [id number] S:.  Patient doing okay today but had significant fevers overnight.  Seen on HD and tolerating well O:BP (!) 160/93 (BP Location: Right Arm)   Pulse (!) 113   Temp 100.1 F (37.8 C) (Oral)   Resp (!) 27   Ht 5' 2.99" (1.6 m)   Wt 71.2 kg   SpO2 97%   BMI 27.81 kg/m   Intake/Output Summary (Last 24 hours) at 03/14/2020 1100 Last data filed at 03/13/2020 1300 Gross per 24 hour  Intake 480.67 ml  Output 25 ml  Net 455.67 ml   Intake/Output: I/O last 3 completed shifts: In: 480.7 [P.O.:240; I.V.:240.7] Out: 25 [Blood:25]  Intake/Output this shift:  No intake/output data recorded. Weight change:  Gen: NAD CVS: Normal rate Resp: Bilateral chest rise Abd: +BS, soft, NT/ND Ext: 1+ pitting edema in the bilateral lower extremities, left upper extremity AVG with good thrill  Recent Labs  Lab 03/08/20 0046 03/09/20 0150 03/10/20 0025 03/11/20 0206 03/12/20 0019 03/13/20 1007 03/14/20 0133  NA 133* 131* 131* 133* 132* 134* 132*  K 3.6 3.5 3.7 3.5 3.6 3.6 3.9  CL 98 96* 98 99 97* 97* 96*  CO2 24 22 18* 23 22  --  24  GLUCOSE 89 104* 87 93 104* 92 90  BUN 35* 46* 56* 32* 42* 24* 30*  CREATININE 6.48* 8.33* 9.41* 6.59* 8.02* 6.00* 7.16*  ALBUMIN  --  1.7* 1.8* 2.1* 2.2*  --   --   CALCIUM 7.1* 7.5* 7.6* 7.7* 7.9*  --  7.7*  PHOS 4.3 5.0* 5.7* 3.8 4.2  --   --    Liver Function Tests: Recent Labs  Lab 03/10/20 0025 03/11/20 0206 03/12/20 0019  ALBUMIN 1.8* 2.1* 2.2*   No results for input(s): LIPASE, AMYLASE in the last 168 hours. No results for input(s): AMMONIA in the last 168 hours. CBC: Recent Labs  Lab 03/09/20 0150 03/10/20 0025 03/11/20 0206 03/12/20 0019 03/13/20 1007 03/14/20 0133  WBC 9.9 10.1 9.2 10.1  --  10.3  NEUTROABS 7.7 8.0* 6.7  --   --   --   HGB 8.8* 8.7* 8.4* 9.2* 8.8* 8.7*  HCT 26.4* 26.0* 24.5* 28.2* 26.0* 26.7*  MCV 89.2 89.0 88.1 90.1  --  90.2   PLT 301 293 232 298  --  269   Cardiac Enzymes: No results for input(s): CKTOTAL, CKMB, CKMBINDEX, TROPONINI in the last 168 hours. CBG: Recent Labs  Lab 03/13/20 1241 03/13/20 1336 03/13/20 1442 03/13/20 1626 03/13/20 2139  GLUCAP 68* 82 85 83 98    Iron Studies: No results for input(s): IRON, TIBC, TRANSFERRIN, FERRITIN in the last 72 hours. Studies/Results: DG Chest Port 1 View  Result Date: 03/13/2020 CLINICAL DATA:  Pneumonia EXAM: PORTABLE CHEST 1 VIEW COMPARISON:  03/01/2020 FINDINGS: Single frontal view of the chest demonstrates increased opacification of the lower left hemithorax, compatible with increased consolidation and effusion. Right chest is clear. No pneumothorax. Cardiac silhouette is stable. Right internal jugular dialysis catheter tip overlies superior vena cava. IMPRESSION: 1. Increasing left basilar consolidation and effusion, compatible with given history of pneumonia. Electronically Signed   By: Randa Ngo M.D.   On: 03/13/2020 21:12   . chlorhexidine gluconate (MEDLINE KIT)  15 mL Mouth Rinse BID  . Chlorhexidine Gluconate Cloth  6 each Topical Q0600  . dextromethorphan  30 mg Oral BID  . docusate sodium  100 mg Oral  BID  . feeding supplement  237 mL Oral TID BM  . levETIRAcetam  250 mg Oral Q T,Th,Sa-HD  . levETIRAcetam  500 mg Oral BID  . mouth rinse  15 mL Mouth Rinse BID  . metoprolol tartrate  25 mg Oral Once  . metoprolol tartrate  75 mg Oral BID  . multivitamin  1 tablet Oral QHS  . NIFEdipine  90 mg Oral Daily    BMET    Component Value Date/Time   NA 132 (L) 03/14/2020 0133   K 3.9 03/14/2020 0133   CL 96 (L) 03/14/2020 0133   CO2 24 03/14/2020 0133   GLUCOSE 90 03/14/2020 0133   BUN 30 (H) 03/14/2020 0133   CREATININE 7.16 (H) 03/14/2020 0133   CALCIUM 7.7 (L) 03/14/2020 0133   CALCIUM 6.2 (LL) 02/26/2020 0305   GFRNONAA 6 (L) 03/14/2020 0133   GFRAA >60 01/28/2016 1543   CBC    Component Value Date/Time   WBC 10.3  03/14/2020 0133   RBC 2.96 (L) 03/14/2020 0133   HGB 8.7 (L) 03/14/2020 0133   HCT 26.7 (L) 03/14/2020 0133   PLT 269 03/14/2020 0133   MCV 90.2 03/14/2020 0133   MCH 29.4 03/14/2020 0133   MCHC 32.6 03/14/2020 0133   RDW 15.0 03/14/2020 0133   LYMPHSABS 0.9 03/11/2020 0206   MONOABS 1.4 (H) 03/11/2020 0206   EOSABS 0.1 03/11/2020 0206   BASOSABS 0.1 03/11/2020 0206    Assessment/Plan:  1. 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 Given the biopsy results I think we can call her ESRD. 1. Continue HD per TTS schedule 2. Status post AVG, good thrill.  Appreciate help from Dr. Carlis Abbott 3. Undergoing CLIP process; issues with insurance 2. PEA arrest after temp HD catheter placed. Resolved and extubated.  Doing well 3. ID -low-grade fevers in the past now with worsening fevers over the past 24 hours.  Work-up for infection negative in the past.  Antibiotics and work-up per primary team 4. Hypertension: Improving with fluid removal.  Continue to monitor 5. Anemia: Multifactorial.   Iron studies likely not accurate at this time given recent transfusion.  Consider Aranesp and obtaining iron studies in a week or 2. 6. Seizure activity - neurology consulted and felt to be related to metabolic derangements. On keppra per neuro.

## 2020-03-14 NOTE — Progress Notes (Signed)
   03/14/20 1110  Vitals  Temp 99.2 F (37.3 C)  Temp Source Oral  BP (!) 168/94  BP Location Right Arm  BP Method Automatic  Patient Position (if appropriate) Lying  Pulse Rate (!) 113  ECG Heart Rate (!) 122  Resp (!) 26  Oxygen Therapy  SpO2 94 %  O2 Device Room Air  Pain Assessment  Pain Scale 0-10  Pain Score 0  Post-Hemodialysis Assessment  Rinseback Volume (mL) 250 mL  KECN 210 V  Dialyzer Clearance Lightly streaked  Duration of HD Treatment -hour(s) 3.5 hour(s)  Hemodialysis Intake (mL) 1000 mL  UF Total -Machine (mL) 4000 mL  Net UF (mL) 3000 mL  Tolerated HD Treatment Yes  Post-Hemodialysis Comments see progress note  Hemodialysis Catheter Right Internal jugular Double lumen Permanent (Tunneled)  Placement Date/Time: 03/09/20 1341   Placed prior to admission: No  Time Out: Correct patient;Correct site;Correct procedure  Maximum sterile barrier precautions: Hand hygiene;Sterile gloves;Cap;Large sterile sheet;Mask;Sterile gown  Site Prep: Chlorh...  Site Condition No complications  Blue Lumen Status Flushed;Heparin locked;Capped (Central line)  Red Lumen Status Flushed;Capped (Central line);Heparin locked  Catheter fill solution Heparin 1000 units/ml  Catheter fill volume (Arterial) 1.6 cc  Catheter fill volume (Venous) 1.6  Dressing Type Occlusive  Dressing Status Clean;Dry;Intact  Antimicrobial disc in place? Yes  Drainage Description None  Dressing Change Due 03/17/20  Post treatment catheter status Capped and Clamped  HD tx completed, pt stable. Pt noted with low grade fever, chills, tachypnea and tachycardia pre/post dialysis. No c/o pain, no shortness of breath. Pt noted with frequent productive cough. No distress noted. Pt's attending MD assessed pt, new orders noted for followup labs and IV antibiotics. Pt stable upon transfer, primary nurse made aware.

## 2020-03-14 NOTE — Progress Notes (Addendum)
PROGRESS NOTE    Barbara Crawford   EZM:[id number]  DOB: Oct 30, 1955  DOA: 02/25/2020     18  PCP: Vonna Drafts, FNP  Brief narrative/Hospital Course:  [name] is a 65 yo female with past medical history of hypertension, prior Covid (Dec 2021) presented to the hospital with complaints of lower extremity swelling, back pain, generalized malaise, fatigue at home. In the ED, she was found to have a hemoglobin of 6.3, potassium 5.4, sodium 130, bicarb 9, BUN 144, and creatinine 29.98.  CT of the abdomen and pelvis was negative for obstruction or acute abnormality.  Temporary hemodialysis catheter was placed by IR and subsequently, the patient had a PEA arrest associated with seizure-like activity.  She was intubated and subsequently extubated 02/28/2020.   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 Renal biopsy done 03/03/2019.  ANA positive.  ID followed the patient for fever.Trialysis cath was removed on 03/06/20 and subsequently underwent permacath placement.  At this time, patient has developed fever chills and some new cough.  Blood cultures have been sent.  Patient is awaiting for CLIP for hemodialysis.  PT following.  Likely need home health on discharge.  Assessment & Plan:  Principal Problem:   Acute renal failure (HCC) Active Problems:   Normocytic anemia   Uremia   HTN (hypertension)   Perinephric hematoma   Seizure (HCC)   Lobar pneumonia (HCC)   Sepsis (HCC) versus SIRS due to autoimmune process   Fever   Glomerulonephritis   ABLA (acute blood loss anemia)   Pleural effusion   Encounter for orogastric (OG) tube placement   FSGS (focal segmental glomerulosclerosis)  Fever, cough, shortness of breath, dyspnea.  Likely hospital-acquired pneumonia.  New spike of fever cough shortness of breath.  Patient did have recent pneumonia.  Status post extubation 02/28/2020.  Chest x-ray shows consolidation.  F will consult pharmacy to dose  vancomycin and cefepime.  Follow blood cultures.  Chest x-ray from yesterday showed consolidation.  Denies abdominal pain. Duplex ultrasound was negative for DVT.  Continue incentive spirometry.  CT chest on 03/06/2020 showed bilateral effusion and atelectasis.  Might need ID evaluation again if continues to spike fever  Oliguric acute kidney injury > ESRD Status post renal biopsy 03/02/2020 with findings of focal segmental glomerulosclerosis..  Developed a perinephric hematoma post biopsy.    status post IR guided internal jugular tunneled hemodialysis catheter on 03/09/2020.  Awaiting for outpatient hemodialysis arrangement.  Pleural effusion Continue hemodialysis for volume management.  Patient does have hypoalbuminemia as well.  Moderate sized pleural effusion on CT on 3/4; no obvious HF on echo (EF 50-55%).  X-ray from 03/13/2020 showed increasing left basilar consolidation and effusion.  Acute blood loss anemia Stable at this time.  Continue to monitor hemoglobin.  Latest hemoglobin of 8.7.  Acute glomerulonephritis Status post renal biopsy which showed a focal segmental glomerulosclerosis.  Nephrology on board.  Seizure  Patient had seizure with lossof pulse and PEA arrest s/p CPR.  Was seen by neurology and the plan is to continue Keppra 500 mg twice a day  Perinephric hematoma Seen on CT abdomen/pelvis on 03/03/2020.  Continue to monitor closely.  Hemoglobin has remained stable at this time.  CBC Latest Ref Rng & Units 03/14/2020 03/13/2020 03/12/2020  WBC 4.0 - 10.5 K/uL 10.3 - 10.1  Hemoglobin 12.0 - 15.0 g/dL 8.7(L) 8.8(L) 9.2(L)  Hematocrit 36.0 - 46.0 % 26.7(L) 26.0(L) 28.2(L)  Platelets 150 - 400 K/uL 269 -  298    Essential hypertension Continue with nifedipine, metoprolol and labetalol as needed.    Uremia Resolved.  Likely secondary to to AKI.  Continue hemodialysis.  Normocytic anemia Status post kidney biopsy and perinephric hematoma.  Status post 2 units of packed RBC on  03/03/2020.  Continue Aranesp as per nephrology.  Recent hemoglobin of 8.87  Asymptomatic bacteriuria.  Acute on chronic respiratory failure with hypoxia  Resolved.  Status post PEA arrest.  Extubated on 02/28/2020.  Currently on room air but slightly tachypneic.  Cardiac arrest with pulseless electrical activity  Status post CPR and intubation.  Stable at this time.  Debility, deconditioning,  Will need home PT on discharge.  Antimicrobials: None at this time.  DVT prophylaxis: Sequential compression device due to recent hematoma     Code Status: Full Code   Family Communication:  I tried to call the patient's daughter [name] on the phone but was unable to reach her.  Disposition Plan: Status is: Inpatient  Remains inpatient appropriate because:, IV treatments appropriate due to intensity of illness or inability to take PO and Inpatient level of care appropriate due to severity of illness, will need hemodialysis set up as outpatient, new fever possible pneumonia  Dispo:  Patient From: Home  Planned Disposition: Home with Health Care Svc   Anticipated date of discharge.  2 to 3 days, fever trend monitoring, OP HD is setup.  Medically stable for discharge: No  Subjective Today, patient was seen and examined at bedside.  Complains of cough with clear phlegm production.  Denies chest pain but has some chills and had a fever of most up to 103 F.  Had blood cultures sent yesterday.  Denies dyspnea   Objective:  Vitals with BMI 03/14/2020 03/14/2020 03/14/2020  Height - - -  Weight - - -  BMI - - -  Systolic 443 154 008  Diastolic 90 85 71  Pulse 676 97 97   Body mass index is 27.81 kg/m.  Physical Examination: General:  Average built, not in obvious distress, alert awake oriented, mildly tachypneic HENT:   No scleral pallor or icterus noted. Oral mucosa is moist.  Right internal jugular permacath in place Chest:  Clear breath sounds.  Diminished breath sounds bilaterally.  No crackles or wheezes.  CVS: S1 &S2 heard. No murmur.  Regular rate and rhythm. Abdomen: Soft, nontender, nondistended.  Bowel sounds are heard.   Extremities: No cyanosis, clubbing but bilateral pitting edema noted, peripheral pulses are palpable. Psych: Alert, awake and oriented, normal mood CNS:  No cranial nerve deficits.  Power equal in all extremities.   Skin: Warm and dry.  No rashes noted.  Consultants:   ID  Nephrology  Procedures:    Hemodialysis   internal jugular tunneled hemodialysis catheter on 03/09/2020.  Data Reviewed: I have personally reviewed following labs and imaging studies  Results for orders placed or performed during the hospital encounter of 02/25/20 (from the past 24 hour(s))  I-STAT, chem 8     Status: Abnormal   Collection Time: 03/13/20 10:07 AM  Result Value Ref Range   Sodium 134 (L) 135 - 145 mmol/L   Potassium 3.6 3.5 - 5.1 mmol/L   Chloride 97 (L) 98 - 111 mmol/L   BUN 24 (H) 8 - 23 mg/dL   Creatinine, Ser 6.00 (H) 0.44 - 1.00 mg/dL   Glucose, Bld 92 70 - 99 mg/dL   Calcium, Ion 1.04 (L) 1.15 - 1.40 mmol/L   TCO2 26  22 - 32 mmol/L   Hemoglobin 8.8 (L) 12.0 - 15.0 g/dL   HCT 26.0 (L) 36.0 - 46.0 %  Glucose, capillary     Status: Abnormal   Collection Time: 03/13/20 12:41 PM  Result Value Ref Range   Glucose-Capillary 68 (L) 70 - 99 mg/dL  Glucose, capillary     Status: None   Collection Time: 03/13/20  1:36 PM  Result Value Ref Range   Glucose-Capillary 82 70 - 99 mg/dL  Glucose, capillary     Status: None   Collection Time: 03/13/20  2:42 PM  Result Value Ref Range   Glucose-Capillary 85 70 - 99 mg/dL  Glucose, capillary     Status: None   Collection Time: 03/13/20  4:26 PM  Result Value Ref Range   Glucose-Capillary 83 70 - 99 mg/dL  Glucose, capillary     Status: None   Collection Time: 03/13/20  9:39 PM  Result Value Ref Range   Glucose-Capillary 98 70 - 99 mg/dL  Basic metabolic panel     Status: Abnormal   Collection  Time: 03/14/20  1:33 AM  Result Value Ref Range   Sodium 132 (L) 135 - 145 mmol/L   Potassium 3.9 3.5 - 5.1 mmol/L   Chloride 96 (L) 98 - 111 mmol/L   CO2 24 22 - 32 mmol/L   Glucose, Bld 90 70 - 99 mg/dL   BUN 30 (H) 8 - 23 mg/dL   Creatinine, Ser 7.16 (H) 0.44 - 1.00 mg/dL   Calcium 7.7 (L) 8.9 - 10.3 mg/dL   GFR, Estimated 6 (L) >60 mL/min   Anion gap 12 5 - 15  CBC     Status: Abnormal   Collection Time: 03/14/20  1:33 AM  Result Value Ref Range   WBC 10.3 4.0 - 10.5 K/uL   RBC 2.96 (L) 3.87 - 5.11 MIL/uL   Hemoglobin 8.7 (L) 12.0 - 15.0 g/dL   HCT 26.7 (L) 36.0 - 46.0 %   MCV 90.2 80.0 - 100.0 fL   MCH 29.4 26.0 - 34.0 pg   MCHC 32.6 30.0 - 36.0 g/dL   RDW 15.0 11.5 - 15.5 %   Platelets 269 150 - 400 K/uL   nRBC 0.0 0.0 - 0.2 %  Magnesium     Status: None   Collection Time: 03/14/20  1:33 AM  Result Value Ref Range   Magnesium 1.8 1.7 - 2.4 mg/dL  Urinalysis, Complete w Microscopic     Status: Abnormal   Collection Time: 03/14/20  3:00 AM  Result Value Ref Range   Color, Urine YELLOW YELLOW   APPearance CLOUDY (A) CLEAR   Specific Gravity, Urine 1.023 1.005 - 1.030   pH 8.0 5.0 - 8.0   Glucose, UA 50 (A) NEGATIVE mg/dL   Hgb urine dipstick SMALL (A) NEGATIVE   Bilirubin Urine NEGATIVE NEGATIVE   Ketones, ur 5 (A) NEGATIVE mg/dL   Protein, ur >=300 (A) NEGATIVE mg/dL   Nitrite NEGATIVE NEGATIVE   Leukocytes,Ua NEGATIVE NEGATIVE   RBC / HPF 6-10 0 - 5 RBC/hpf   WBC, UA 21-50 0 - 5 WBC/hpf   Bacteria, UA FEW (A) NONE SEEN   Squamous Epithelial / LPF 0-5 0 - 5   Mucus PRESENT     Recent Results (from the past 240 hour(s))  Culture, blood (routine x 2)     Status: None   Collection Time: 03/04/20  8:48 PM   Specimen: BLOOD  Result Value Ref Range Status  Specimen Description BLOOD LEFT ANTECUBITAL  Final   Special Requests   Final    BOTTLES DRAWN AEROBIC AND ANAEROBIC Blood Culture results may not be optimal due to an inadequate volume of blood received in  culture bottles   Culture   Final    NO GROWTH 5 DAYS Performed at [facility], [place] [address], [place], [place] [address]    Report Status 03/09/2020 FINAL  Final  Culture, blood (routine x 2)     Status: None   Collection Time: 03/04/20  8:48 PM   Specimen: BLOOD LEFT HAND  Result Value Ref Range Status   Specimen Description BLOOD LEFT HAND  Final   Special Requests   Final    BOTTLES DRAWN AEROBIC AND ANAEROBIC Blood Culture results may not be optimal due to an inadequate volume of blood received in culture bottles   Culture   Final    NO GROWTH 5 DAYS Performed at [facility], [place] [address], [place], [place] [address]    Report Status 03/09/2020 FINAL  Final     Radiology Studies: DG Chest Port 1 View  Result Date: 03/13/2020 CLINICAL DATA:  Pneumonia EXAM: PORTABLE CHEST 1 VIEW COMPARISON:  03/01/2020 FINDINGS: Single frontal view of the chest demonstrates increased opacification of the lower left hemithorax, compatible with increased consolidation and effusion. Right chest is clear. No pneumothorax. Cardiac silhouette is stable. Right internal jugular dialysis catheter tip overlies superior vena cava. IMPRESSION: 1. Increasing left basilar consolidation and effusion, compatible with given history of pneumonia. Electronically Signed   By: Randa Ngo M.D.   On: 03/13/2020 21:12   DG Chest Port 1 View  Final Result    VAS [place] UPPER EXT VEIN MAPPING (PRE-OP AVF)  Final Result    IR Fluoro Guide CV Line Right  Final Result    IR US Guide Vasc Access Right  Final Result    VAS [place] LOWER EXTREMITY VENOUS (DVT)  Final Result    VAS [place] UPPER EXTREMITY VENOUS DUPLEX  Final Result    CT CHEST WO CONTRAST  Final Result    CT ABDOMEN PELVIS WO CONTRAST  Final Result    US BIOPSY (KIDNEY)  Final Result    DG Chest 2 View  Final Result    DG CHEST PORT 1 VIEW  Final Result    MR BRAIN WO CONTRAST  Final Result    MR ANGIO HEAD WO CONTRAST  Final  Result    CT HEAD WO CONTRAST  Final Result    DG Abd 1 View  Final Result    DG CHEST PORT 1 VIEW  Final Result    DG Chest Port 1 View  Final Result    IR Fluoro Guide CV Line Right  Final Result    IR US Guide Vasc Access Right  Final Result    US RENAL  Final Result    CT Renal Stone Study  Final Result      Scheduled Meds: . chlorhexidine gluconate (MEDLINE KIT)  15 mL Mouth Rinse BID  . Chlorhexidine Gluconate Cloth  6 each Topical Q0600  . dextromethorphan  30 mg Oral BID  . docusate sodium  100 mg Oral BID  . feeding supplement  237 mL Oral TID BM  . levETIRAcetam  250 mg Oral Q T,Th,Sa-HD  . levETIRAcetam  500 mg Oral BID  . mouth rinse  15 mL Mouth Rinse BID  . metoprolol tartrate  50 mg Oral  BID  . multivitamin  1 tablet Oral QHS  . NIFEdipine  90 mg Oral Daily   PRN Meds: acetaminophen, guaiFENesin, labetalol, LORazepam, melatonin, ondansetron **OR** ondansetron (ZOFRAN) IV, oxyCODONE Continuous Infusions: . sodium chloride Stopped (03/13/20 1300)     LOS: 18 days    Flora Lipps, MD [facility] 03/14/2020, 9:38 AM

## 2020-03-15 DIAGNOSIS — D62 Acute posthemorrhagic anemia: Secondary | ICD-10-CM | POA: Diagnosis not present

## 2020-03-15 DIAGNOSIS — J9621 Acute and chronic respiratory failure with hypoxia: Secondary | ICD-10-CM | POA: Diagnosis not present

## 2020-03-15 DIAGNOSIS — N179 Acute kidney failure, unspecified: Secondary | ICD-10-CM | POA: Diagnosis not present

## 2020-03-15 DIAGNOSIS — R8271 Bacteriuria: Secondary | ICD-10-CM | POA: Diagnosis not present

## 2020-03-15 LAB — CBC
HCT: 24.2 % — ABNORMAL LOW (ref 36.0–46.0)
Hemoglobin: 8.1 g/dL — ABNORMAL LOW (ref 12.0–15.0)
MCH: 29.9 pg (ref 26.0–34.0)
MCHC: 33.5 g/dL (ref 30.0–36.0)
MCV: 89.3 fL (ref 80.0–100.0)
Platelets: 268 10*3/uL (ref 150–400)
RBC: 2.71 MIL/uL — ABNORMAL LOW (ref 3.87–5.11)
RDW: 15.2 % (ref 11.5–15.5)
WBC: 10.1 10*3/uL (ref 4.0–10.5)
nRBC: 0 % (ref 0.0–0.2)

## 2020-03-15 LAB — GLUCOSE, CAPILLARY
Glucose-Capillary: 76 mg/dL (ref 70–99)
Glucose-Capillary: 76 mg/dL (ref 70–99)
Glucose-Capillary: 85 mg/dL (ref 70–99)
Glucose-Capillary: 100 mg/dL — ABNORMAL HIGH (ref 70–99)

## 2020-03-15 LAB — URINE CULTURE: Culture: NO GROWTH

## 2020-03-15 LAB — BASIC METABOLIC PANEL
Anion gap: 10 (ref 5–15)
BUN: 19 mg/dL (ref 8–23)
CO2: 25 mmol/L (ref 22–32)
Calcium: 7.8 mg/dL — ABNORMAL LOW (ref 8.9–10.3)
Chloride: 98 mmol/L (ref 98–111)
Creatinine, Ser: 5.37 mg/dL — ABNORMAL HIGH (ref 0.44–1.00)
GFR, Estimated: 8 mL/min — ABNORMAL LOW (ref >60)
Glucose, Bld: 81 mg/dL (ref 70–99)
Potassium: 3.8 mmol/L (ref 3.5–5.1)
Sodium: 133 mmol/L — ABNORMAL LOW (ref 135–145)

## 2020-03-15 LAB — MAGNESIUM: Magnesium: 1.9 mg/dL (ref 1.7–2.4)

## 2020-03-15 LAB — PHOSPHORUS: Phosphorus: 4 mg/dL (ref 2.5–4.6)

## 2020-03-15 MED ORDER — DARBEPOETIN ALFA 40 MCG/0.4ML IJ SOSY: 40.0000 ug | PREFILLED_SYRINGE | INTRAMUSCULAR | Status: DC

## 2020-03-15 NOTE — Progress Notes (Incomplete)
Pt family arrived and pt sy

## 2020-03-15 NOTE — Progress Notes (Signed)
Temp 102.4, Tylenol 1000 mg po was given, CN notified, Dr Cyd Silence was paged, will cont to monitor, pt taking fluids and using Incentive spirometer.

## 2020-03-15 NOTE — Progress Notes (Signed)
NoPatient ID: Barbara Crawford, female   DOB: March 21, 1955, 65 y.o.   MRN: [id number] S:.  Patient states she is doing fairly well today.  Dialysis continues to make her energy go away.  She continues to be febrile intermittently but overall feels better today.  Feels like her edema is improving.  Told her Dr. Posey Pronto would be taking over service tomorrow.  She was excited to hear he was a cool guy. O:BP 124/66 (BP Location: Right Arm)   Pulse (!) 102   Temp (!) 100.4 F (38 C) (Oral)   Resp (!) 22   Ht 5' 2.99" (1.6 m)   Wt 68 kg   SpO2 98%   BMI 26.56 kg/m   Intake/Output Summary (Last 24 hours) at 03/15/2020 9381 Last data filed at 03/15/2020 0500 Gross per 24 hour  Intake 100 ml  Output 3000 ml  Net -2900 ml   Intake/Output: I/O last 3 completed shifts: In: 100 [IV Piggyback:100] Out: 3000 [Other:3000]  Intake/Output this shift:  No intake/output data recorded. Weight change:  Gen: NAD CVS: Normal rate Resp: Bilateral chest rise Abd: +BS, soft, NT/ND Ext: 2+ pitting edema in the bilateral lower extremities  Recent Labs  Lab 03/09/20 0150 03/10/20 0025 03/11/20 0206 03/12/20 0019 03/13/20 1007 03/14/20 0133 03/15/20 0419  NA 131* 131* 133* 132* 134* 132* 133*  K 3.5 3.7 3.5 3.6 3.6 3.9 3.8  CL 96* 98 99 97* 97* 96* 98  CO2 22 18* 23 22  --  24 25  GLUCOSE 104* 87 93 104* 92 90 81  BUN 46* 56* 32* 42* 24* 30* 19  CREATININE 8.33* 9.41* 6.59* 8.02* 6.00* 7.16* 5.37*  ALBUMIN 1.7* 1.8* 2.1* 2.2*  --   --   --   CALCIUM 7.5* 7.6* 7.7* 7.9*  --  7.7* 7.8*  PHOS 5.0* 5.7* 3.8 4.2  --   --  4.0   Liver Function Tests: Recent Labs  Lab 03/10/20 0025 03/11/20 0206 03/12/20 0019  ALBUMIN 1.8* 2.1* 2.2*   No results for input(s): LIPASE, AMYLASE in the last 168 hours. No results for input(s): AMMONIA in the last 168 hours. CBC: Recent Labs  Lab 03/09/20 0150 03/10/20 0025 03/11/20 0206 03/12/20 0019 03/13/20 1007 03/14/20 0133 03/15/20 0419  WBC 9.9 10.1  9.2 10.1  --  10.3 10.1  NEUTROABS 7.7 8.0* 6.7  --   --   --   --   HGB 8.8* 8.7* 8.4* 9.2* 8.8* 8.7* 8.1*  HCT 26.4* 26.0* 24.5* 28.2* 26.0* 26.7* 24.2*  MCV 89.2 89.0 88.1 90.1  --  90.2 89.3  PLT 301 293 232 298  --  269 268   Cardiac Enzymes: No results for input(s): CKTOTAL, CKMB, CKMBINDEX, TROPONINI in the last 168 hours. CBG: Recent Labs  Lab 03/13/20 2139 03/14/20 1145 03/14/20 1637 03/14/20 2114 03/15/20 0818  GLUCAP 98 68* 80 98 76    Iron Studies: No results for input(s): IRON, TIBC, TRANSFERRIN, FERRITIN in the last 72 hours. Studies/Results: DG Chest Port 1 View  Result Date: 03/13/2020 CLINICAL DATA:  Pneumonia EXAM: PORTABLE CHEST 1 VIEW COMPARISON:  03/01/2020 FINDINGS: Single frontal view of the chest demonstrates increased opacification of the lower left hemithorax, compatible with increased consolidation and effusion. Right chest is clear. No pneumothorax. Cardiac silhouette is stable. Right internal jugular dialysis catheter tip overlies superior vena cava. IMPRESSION: 1. Increasing left basilar consolidation and effusion, compatible with given history of pneumonia. Electronically Signed   By: Randa Ngo  M.D.   On: 03/13/2020 21:12   . chlorhexidine gluconate (MEDLINE KIT)  15 mL Mouth Rinse BID  . Chlorhexidine Gluconate Cloth  6 each Topical Q0600  . dextromethorphan  30 mg Oral BID  . docusate sodium  100 mg Oral BID  . feeding supplement  237 mL Oral TID BM  . levETIRAcetam  250 mg Oral Q T,Th,Sa-HD  . levETIRAcetam  500 mg Oral BID  . mouth rinse  15 mL Mouth Rinse BID  . metoprolol tartrate  75 mg Oral BID  . multivitamin  1 tablet Oral QHS  . NIFEdipine  90 mg Oral Daily    BMET    Component Value Date/Time   NA 133 (L) 03/15/2020 0419   K 3.8 03/15/2020 0419   CL 98 03/15/2020 0419   CO2 25 03/15/2020 0419   GLUCOSE 81 03/15/2020 0419   BUN 19 03/15/2020 0419   CREATININE 5.37 (H) 03/15/2020 0419   CALCIUM 7.8 (L) 03/15/2020 0419    CALCIUM 6.2 (LL) 02/26/2020 0305   GFRNONAA 8 (L) 03/15/2020 0419   GFRAA >60 01/28/2016 1543   CBC    Component Value Date/Time   WBC 10.1 03/15/2020 0419   RBC 2.71 (L) 03/15/2020 0419   HGB 8.1 (L) 03/15/2020 0419   HCT 24.2 (L) 03/15/2020 0419   PLT 268 03/15/2020 0419   MCV 89.3 03/15/2020 0419   MCH 29.9 03/15/2020 0419   MCHC 33.5 03/15/2020 0419   RDW 15.2 03/15/2020 0419   LYMPHSABS 0.9 03/11/2020 0206   MONOABS 1.4 (H) 03/11/2020 0206   EOSABS 0.1 03/11/2020 0206   BASOSABS 0.1 03/11/2020 0206    Assessment/Plan:  1. 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 Given the biopsy results I think we can call her ESRD. 1. Continue HD per TTS schedule 2. Status post AVG, good thrill.  Appreciate help from Dr. Carlis Abbott 3. Undergoing CLIP process; issues with insurance 2. PEA arrest after temp HD catheter placed. Resolved and extubated.  Doing well 3. ID -low-grade fevers in the past now with worsening fevers over the past 24 hours. CXR concerning for pneumonia.  Antibiotics per primary team 4. Hypertension: Improving with fluid removal.  Continue to monitor 5. Anemia: Multifactorial.   Has received transfusion this hospitalization.  Consider repeat iron studies if she remains inpatient in a week.  We will start Aranesp on Tuesday 6. Seizure activity - neurology consulted and felt to be related to metabolic derangements. Improved.On keppra per neuro.

## 2020-03-15 NOTE — Progress Notes (Signed)
PROGRESS NOTE    Barbara Crawford   OZY:[id number]  DOB: 29-Jan-1955  DOA: 02/25/2020     19  PCP: Vonna Drafts, FNP  Brief narrative/Hospital Course:  [name] is a 65 yo female with past medical history of hypertension, prior Covid (Dec 2021) presented to the hospital with complaints of lower extremity swelling, back pain, generalized malaise, fatigue at home. In the ED, she was found to have a hemoglobin of 6.3, potassium 5.4, sodium 130, bicarb 9, BUN 144, and creatinine 29.98.  CT of the abdomen and pelvis was negative for obstruction or acute abnormality.  Temporary hemodialysis catheter was placed by IR and subsequently, the patient had a PEA arrest associated with seizure-like activity.  She was intubated and subsequently extubated 02/28/2020.   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 Renal biopsy done 03/03/2019.  ANA positive.  ID followed the patient for fever.Trialysis cath was removed on 03/06/20 and subsequently underwent permacath placement.  At this time, patient has developed fever chills and some new cough.  Blood cultures have been sent.  Patient is awaiting for CLIP for hemodialysis.  PT following.  Likely need home health on discharge.  Assessment & Plan:  Principal Problem:   Acute renal failure (HCC) Active Problems:   Normocytic anemia   Uremia   HTN (hypertension)   Perinephric hematoma   Seizure (HCC)   Lobar pneumonia (HCC)   Sepsis (HCC) versus SIRS due to autoimmune process   Fever   Glomerulonephritis   ABLA (acute blood loss anemia)   Pleural effusion   Encounter for orogastric (OG) tube placement   FSGS (focal segmental glomerulosclerosis)  Fever, cough, shortness of breath, dyspnea.  Likely hospital-acquired pneumonia.    Patient did have recent pneumonia.  Status post extubation 02/28/2020.  Chest x-ray shows consolidation. On IV vancomycin and cefepime.  Follow blood cultures -negative less than 24  hours.  Chest x-ray  showed consolidation.  Duplex ultrasound was negative for DVT.  Continue incentive spirometry.  CT chest on 03/06/2020 showed bilateral effusion and atelectasis.  CBC at 13.1.  Temperature max of 102.4 F.  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  Developed a perinephric hematoma post biopsy.    Status post IR guided internal jugular tunneled hemodialysis catheter on 03/09/2020.  Awaiting for outpatient hemodialysis arrangement.  Pleural effusion Continue hemodialysis for volume management.  Patient does have hypoalbuminemia as well.  Moderate sized pleural effusion on CT on 3/4; no obvious HF on echo (EF 50-55%).  X-ray from 03/13/2020 showed increasing left basilar consolidation and effusion.  Acute blood loss anemia Stable at this time.  Continue to monitor hemoglobin.  Latest hemoglobin of 8.1.  Acute glomerulonephritis Status post renal biopsy which showed a focal segmental glomerulosclerosis.  Nephrology on board.  Seizure  Patient had seizure with lossof pulse and PEA arrest s/p CPR.  Was seen by neurology and the plan is to continue Keppra 500 mg twice a day  Perinephric hematoma Seen on CT abdomen/pelvis on 03/03/2020.  Continue to monitor closely.  Hemoglobin has remained stable at this time.  CBC Latest Ref Rng & Units 03/15/2020 03/14/2020 03/13/2020  WBC 4.0 - 10.5 K/uL 10.1 10.3 -  Hemoglobin 12.0 - 15.0 g/dL 8.1(L) 8.7(L) 8.8(L)  Hematocrit 36.0 - 46.0 % 24.2(L) 26.7(L) 26.0(L)  Platelets 150 - 400 K/uL 268 269 -    Essential hypertension Continue with nifedipine, metoprolol and  labetalol as needed.    Uremia Resolved.  Likely secondary to to AKI.  Continue hemodialysis.  Normocytic anemia Status post kidney biopsy and perinephric hematoma.  Status post 2 units of packed RBC on 03/03/2020.  Continue Aranesp as per nephrology.  Recent hemoglobin of 8.1  Asymptomatic  bacteriuria.  Acute on chronic respiratory failure with hypoxia  Resolved.  Status post PEA arrest.  Extubated on 02/28/2020.  Currently on room air  Cardiac arrest with pulseless electrical activity  Status post CPR and intubation.  Stable at this time.  Debility, deconditioning,  Will need home PT on discharge.  Antimicrobials: None at this time.  DVT prophylaxis: Sequential compression device due to recent hematoma     Code Status: Full Code    Family Communication:  None today.   Disposition Plan: Status is: Inpatient  Remains inpatient appropriate because:, IV treatments appropriate due to intensity of illness or inability to take PO and Inpatient level of care appropriate due to severity of illness, will need hemodialysis set up as outpatient, new fever- possible pneumonia  Dispo:  Patient From: Home  Planned Disposition: Home with Health Care Svc   Anticipated date of discharge.  2 to 3 days, continue to monitor  fever trend, OP HD is setup.  Medically stable for discharge: No  Subjective Today, patient was seen and examined at bedside.  Patient states that she feels little better today.  Has cough which is dry today.  No chest pain.  Has less chills today.    Objective:  Vitals with BMI 03/15/2020 03/15/2020 03/14/2020  Height - - -  Weight - - -  BMI - - -  Systolic 676 195 093  Diastolic 66 76 81  Pulse - 102 -   Body mass index is 26.56 kg/m.  Physical Examination: General:  Average built, not in obvious distress, alert awake oriented HENT:   No scleral pallor or icterus noted. Oral mucosa is moist.  Right internal jugular permacath in place Chest:  Clear breath sounds.  Diminished breath sounds bilaterally. No crackles or wheezes.  CVS: S1 &S2 heard. No murmur.  Regular rate and rhythm. Abdomen: Soft, nontender, nondistended.  Bowel sounds are heard.   Extremities: No cyanosis, clubbing but bilateral pitting edema noted, peripheral pulses are  palpable. Psych: Alert, awake and oriented, normal mood CNS:  No cranial nerve deficits.  Power equal in all extremities.   Skin: Warm and dry.    Consultants:   ID  Nephrology  Procedures:    Hemodialysis   internal jugular tunneled hemodialysis catheter on 03/09/2020.  Data Reviewed: I have personally reviewed following labs and imaging studies  Results for orders placed or performed during the hospital encounter of 02/25/20 (from the past 24 hour(s))  Glucose, capillary     Status: Abnormal   Collection Time: 03/14/20 11:45 AM  Result Value Ref Range   Glucose-Capillary 68 (L) 70 - 99 mg/dL  Glucose, capillary     Status: None   Collection Time: 03/14/20  4:37 PM  Result Value Ref Range   Glucose-Capillary 80 70 - 99 mg/dL  Glucose, capillary     Status: None   Collection Time: 03/14/20  9:14 PM  Result Value Ref Range   Glucose-Capillary 98 70 - 99 mg/dL  Basic metabolic panel     Status: Abnormal   Collection Time: 03/15/20  4:19 AM  Result Value Ref Range   Sodium 133 (L) 135 - 145 mmol/L   Potassium 3.8 3.5 -  5.1 mmol/L   Chloride 98 98 - 111 mmol/L   CO2 25 22 - 32 mmol/L   Glucose, Bld 81 70 - 99 mg/dL   BUN 19 8 - 23 mg/dL   Creatinine, Ser 5.37 (H) 0.44 - 1.00 mg/dL   Calcium 7.8 (L) 8.9 - 10.3 mg/dL   GFR, Estimated 8 (L) >60 mL/min   Anion gap 10 5 - 15  Magnesium     Status: None   Collection Time: 03/15/20  4:19 AM  Result Value Ref Range   Magnesium 1.9 1.7 - 2.4 mg/dL  Phosphorus     Status: None   Collection Time: 03/15/20  4:19 AM  Result Value Ref Range   Phosphorus 4.0 2.5 - 4.6 mg/dL  CBC     Status: Abnormal   Collection Time: 03/15/20  4:19 AM  Result Value Ref Range   WBC 10.1 4.0 - 10.5 K/uL   RBC 2.71 (L) 3.87 - 5.11 MIL/uL   Hemoglobin 8.1 (L) 12.0 - 15.0 g/dL   HCT 24.2 (L) 36.0 - 46.0 %   MCV 89.3 80.0 - 100.0 fL   MCH 29.9 26.0 - 34.0 pg   MCHC 33.5 30.0 - 36.0 g/dL   RDW 15.2 11.5 - 15.5 %   Platelets 268 150 - 400 K/uL    nRBC 0.0 0.0 - 0.2 %  Glucose, capillary     Status: None   Collection Time: 03/15/20  8:18 AM  Result Value Ref Range   Glucose-Capillary 76 70 - 99 mg/dL    Recent Results (from the past 240 hour(s))  Culture, blood (routine x 2)     Status: None (Preliminary result)   Collection Time: 03/13/20  9:35 PM   Specimen: BLOOD  Result Value Ref Range Status   Specimen Description BLOOD RIGHT ARM  Final   Special Requests   Final    BOTTLES DRAWN AEROBIC AND ANAEROBIC Blood Culture adequate volume   Culture   Final    NO GROWTH < 24 HOURS Performed at [facility], [address], [place], [place] [address]    Report Status PENDING  Incomplete  Culture, blood (routine x 2)     Status: None (Preliminary result)   Collection Time: 03/13/20  9:41 PM   Specimen: BLOOD  Result Value Ref Range Status   Specimen Description BLOOD RIGHT HAND  Final   Special Requests   Final    BOTTLES DRAWN AEROBIC AND ANAEROBIC Blood Culture adequate volume   Culture   Final    NO GROWTH < 24 HOURS Performed at [facility], [place] [address] [place], [place] [address]    Report Status PENDING  Incomplete  Culture, Urine     Status: None   Collection Time: 03/14/20  3:00 AM   Specimen: Urine, Random  Result Value Ref Range Status   Specimen Description URINE, RANDOM  Final   Special Requests NONE  Final   Culture   Final    NO GROWTH Performed at [facility], [place] [address], [place], [place] [address]    Report Status 03/15/2020 FINAL  Final     Radiology Studies: DG Chest Port 1 View  Result Date: 03/13/2020 CLINICAL DATA:  Pneumonia EXAM: PORTABLE CHEST 1 VIEW COMPARISON:  03/01/2020 FINDINGS: Single frontal view of the chest demonstrates increased opacification of the lower left hemithorax, compatible with increased consolidation and effusion. Right chest is clear. No pneumothorax. Cardiac silhouette is stable. Right internal jugular  dialysis catheter tip overlies superior vena  cava. IMPRESSION: 1. Increasing left basilar consolidation and effusion, compatible with given history of pneumonia. Electronically Signed   By: Randa Ngo M.D.   On: 03/13/2020 21:12   DG Chest Port 1 View  Final Result    VAS [place] UPPER EXT VEIN MAPPING (PRE-OP AVF)  Final Result    IR Fluoro Guide CV Line Right  Final Result    IR US Guide Vasc Access Right  Final Result    VAS [place] LOWER EXTREMITY VENOUS (DVT)  Final Result    VAS [place] UPPER EXTREMITY VENOUS DUPLEX  Final Result    CT CHEST WO CONTRAST  Final Result    CT ABDOMEN PELVIS WO CONTRAST  Final Result    US BIOPSY (KIDNEY)  Final Result    DG Chest 2 View  Final Result    DG CHEST PORT 1 VIEW  Final Result    MR BRAIN WO CONTRAST  Final Result    MR ANGIO HEAD WO CONTRAST  Final Result    CT HEAD WO CONTRAST  Final Result    DG Abd 1 View  Final Result    DG CHEST PORT 1 VIEW  Final Result    DG Chest Port 1 View  Final Result    IR Fluoro Guide CV Line Right  Final Result    IR US Guide Vasc Access Right  Final Result    US RENAL  Final Result    CT Renal Stone Study  Final Result      Scheduled Meds: . chlorhexidine gluconate (MEDLINE KIT)  15 mL Mouth Rinse BID  . Chlorhexidine Gluconate Cloth  6 each Topical Q0600  . [START ON 03/17/2020] darbepoetin (ARANESP) injection - DIALYSIS  40 mcg Intravenous Q Tue-HD  . dextromethorphan  30 mg Oral BID  . docusate sodium  100 mg Oral BID  . feeding supplement  237 mL Oral TID BM  . levETIRAcetam  250 mg Oral Q T,Th,Sa-HD  . levETIRAcetam  500 mg Oral BID  . mouth rinse  15 mL Mouth Rinse BID  . metoprolol tartrate  75 mg Oral BID  . multivitamin  1 tablet Oral QHS  . NIFEdipine  90 mg Oral Daily   PRN Meds: acetaminophen, guaiFENesin, labetalol, LORazepam, melatonin, menthol-cetylpyridinium, ondansetron **OR** ondansetron (ZOFRAN) IV, oxyCODONE Continuous Infusions: . sodium chloride 10 mL/hr at 03/14/20 1441  . ceFEPime  (MAXIPIME) IV 2 g (03/14/20 1443)  . [START ON 03/17/2020] vancomycin       LOS: 19 days    Flora Lipps, MD [facility] 03/15/2020, 10:21 AM

## 2020-03-16 ENCOUNTER — Inpatient Hospital Stay (HOSPITAL_COMMUNITY): Payer: 59

## 2020-03-16 DIAGNOSIS — R509 Fever, unspecified: Secondary | ICD-10-CM | POA: Diagnosis not present

## 2020-03-16 DIAGNOSIS — R8271 Bacteriuria: Secondary | ICD-10-CM | POA: Diagnosis not present

## 2020-03-16 DIAGNOSIS — N041 Nephrotic syndrome with focal and segmental glomerular lesions: Secondary | ICD-10-CM

## 2020-03-16 DIAGNOSIS — Z8616 Personal history of COVID-19: Secondary | ICD-10-CM | POA: Diagnosis not present

## 2020-03-16 DIAGNOSIS — D62 Acute posthemorrhagic anemia: Secondary | ICD-10-CM | POA: Diagnosis not present

## 2020-03-16 DIAGNOSIS — N179 Acute kidney failure, unspecified: Secondary | ICD-10-CM | POA: Diagnosis not present

## 2020-03-16 DIAGNOSIS — J9621 Acute and chronic respiratory failure with hypoxia: Secondary | ICD-10-CM | POA: Diagnosis not present

## 2020-03-16 LAB — BASIC METABOLIC PANEL
Anion gap: 12 (ref 5–15)
BUN: 30 mg/dL — ABNORMAL HIGH (ref 8–23)
CO2: 24 mmol/L (ref 22–32)
Calcium: 7.8 mg/dL — ABNORMAL LOW (ref 8.9–10.3)
Chloride: 97 mmol/L — ABNORMAL LOW (ref 98–111)
Creatinine, Ser: 7.35 mg/dL — ABNORMAL HIGH (ref 0.44–1.00)
GFR, Estimated: 6 mL/min — ABNORMAL LOW (ref >60)
Glucose, Bld: 83 mg/dL (ref 70–99)
Potassium: 3.7 mmol/L (ref 3.5–5.1)
Sodium: 133 mmol/L — ABNORMAL LOW (ref 135–145)

## 2020-03-16 LAB — FERRITIN: Ferritin: 1917 ng/mL — ABNORMAL HIGH (ref 11–307)

## 2020-03-16 LAB — MAGNESIUM: Magnesium: 1.9 mg/dL (ref 1.7–2.4)

## 2020-03-16 LAB — C-REACTIVE PROTEIN: CRP: 25.7 mg/dL — ABNORMAL HIGH (ref <1.0)

## 2020-03-16 LAB — CBC
HCT: 23.3 % — ABNORMAL LOW (ref 36.0–46.0)
Hemoglobin: 7.6 g/dL — ABNORMAL LOW (ref 12.0–15.0)
MCH: 29.6 pg (ref 26.0–34.0)
MCHC: 32.6 g/dL (ref 30.0–36.0)
MCV: 90.7 fL (ref 80.0–100.0)
Platelets: 276 10*3/uL (ref 150–400)
RBC: 2.57 MIL/uL — ABNORMAL LOW (ref 3.87–5.11)
RDW: 15.2 % (ref 11.5–15.5)
WBC: 8.4 10*3/uL (ref 4.0–10.5)
nRBC: 0 % (ref 0.0–0.2)

## 2020-03-16 LAB — GLUCOSE, CAPILLARY
Glucose-Capillary: 79 mg/dL (ref 70–99)
Glucose-Capillary: 85 mg/dL (ref 70–99)
Glucose-Capillary: 90 mg/dL (ref 70–99)
Glucose-Capillary: 97 mg/dL (ref 70–99)

## 2020-03-16 LAB — HEPATITIS A ANTIBODY, TOTAL: hep A Total Ab: NONREACTIVE

## 2020-03-16 LAB — CK: Total CK: 81 U/L (ref 38–234)

## 2020-03-16 LAB — LACTATE DEHYDROGENASE: LDH: 325 U/L — ABNORMAL HIGH (ref 98–192)

## 2020-03-16 LAB — HEPATITIS A ANTIBODY, IGM: Hep A IgM: NONREACTIVE

## 2020-03-16 IMAGING — CT CT ABD-PELV W/ CM
2 of 5 series · 16 of 46 positions shown, 18 images · IV contrast (APPLIED)
Comparison: [DATE]

CLINICAL DATA: Abdominal pain and fever.

EXAM:
CT ABDOMEN AND PELVIS WITH CONTRAST
TECHNIQUE: Multidetector CT imaging of the abdomen and pelvis was performed
using the standard protocol following bolus administration of
intravenous contrast.
CONTRAST:  75mL OMNIPAQUE IOHEXOL 300 MG/ML  SOLN

[Series 3: abd/ pelvis 5.0 i30f 2 · axial · 0.88mm/px · z∈[-622,-257]mm · 13 of 83 slices shown, 15 images]
[im 5/83  soft-tissue]
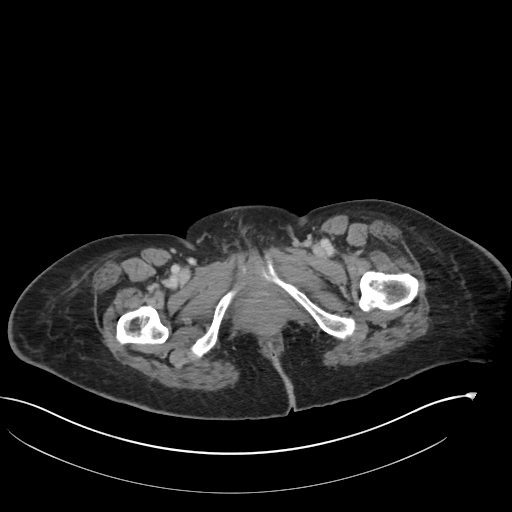
[im 5/83  bone]
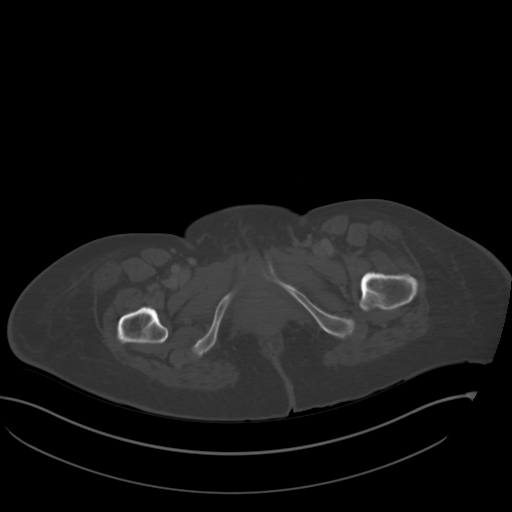
[im 13/83  soft-tissue]
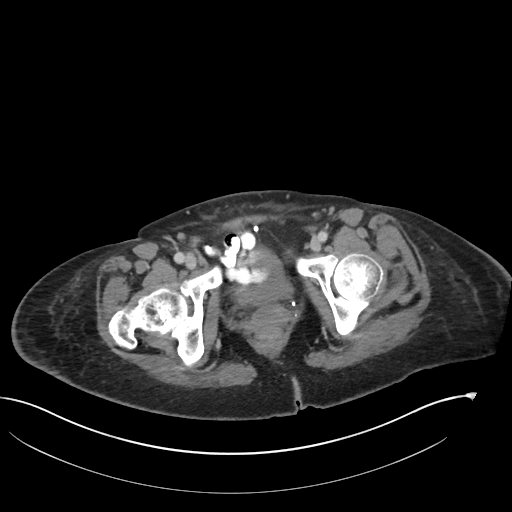
[im 17/83  soft-tissue]
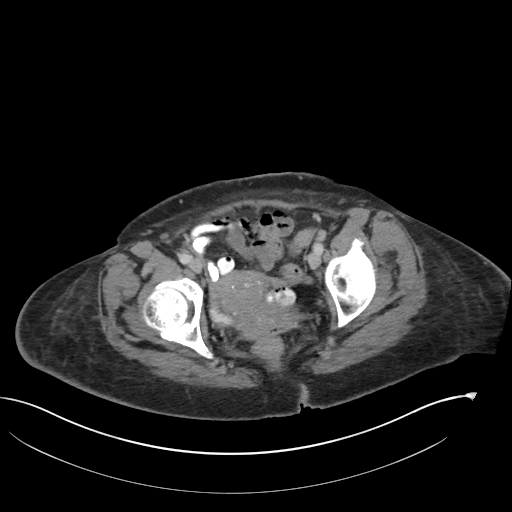
[im 25/83  soft-tissue]
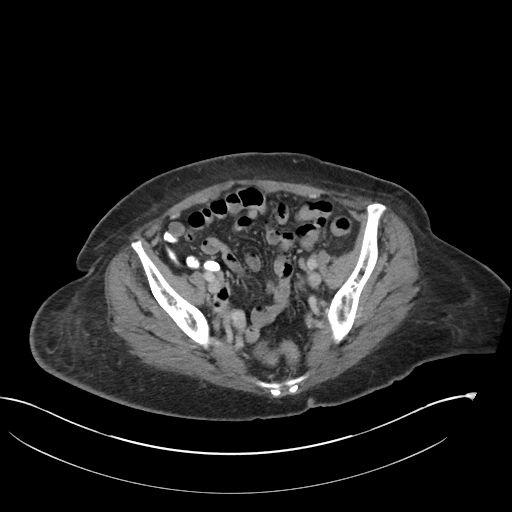
[im 29/83  soft-tissue]
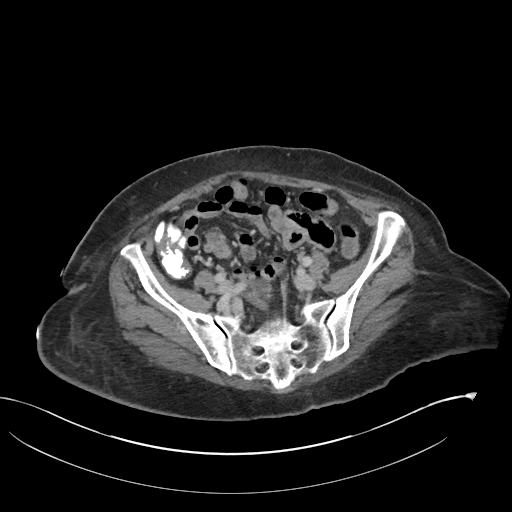
[im 37/83  soft-tissue]
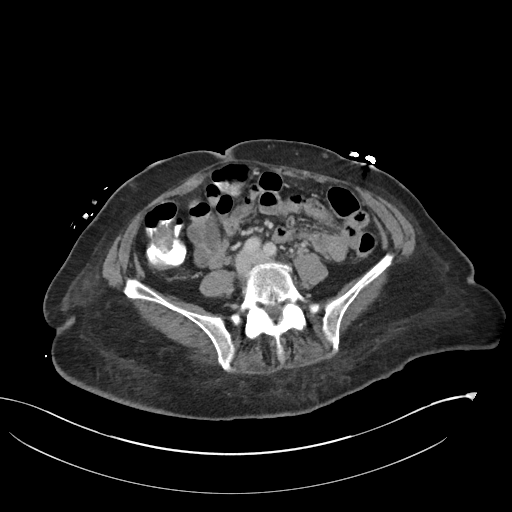
[im 42/83  soft-tissue]
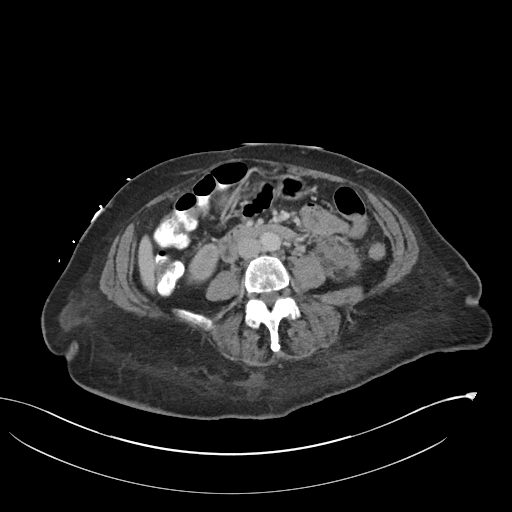
[im 46/83  soft-tissue]
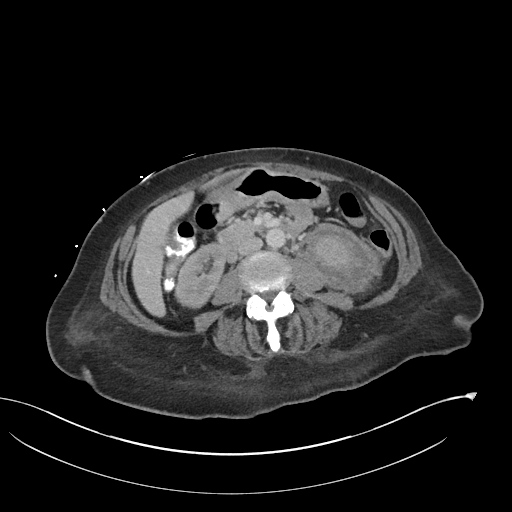
[im 54/83  soft-tissue]
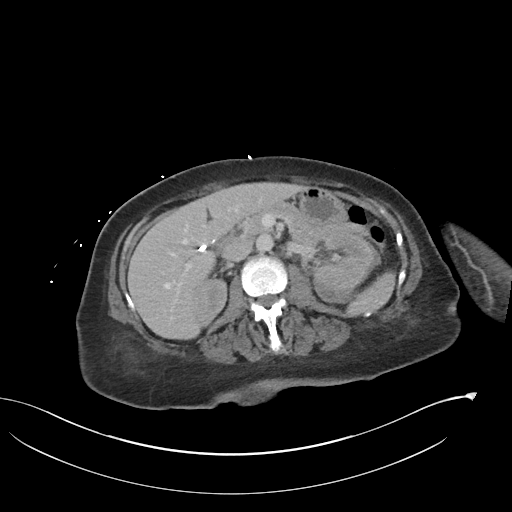
[im 54/83  bone]
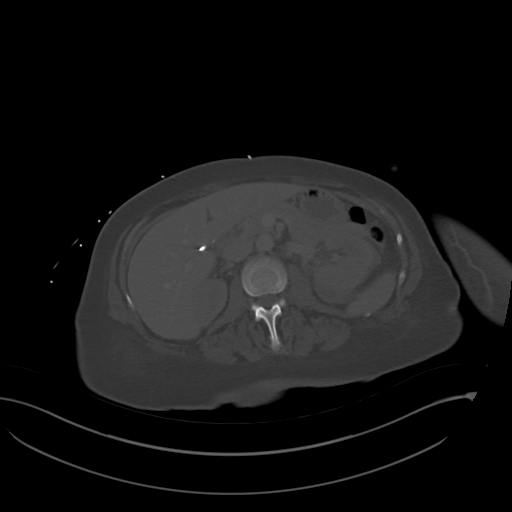
[im 58/83  soft-tissue]
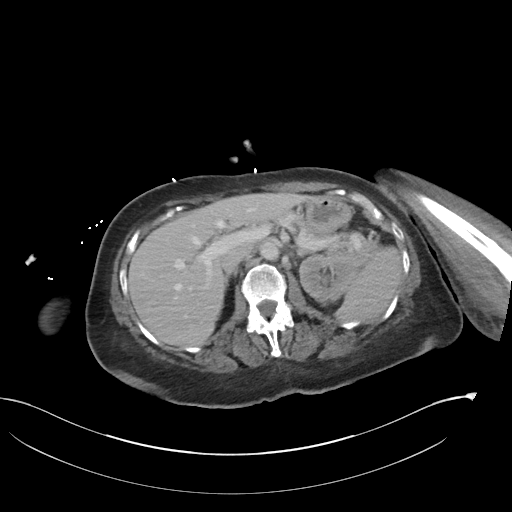
[im 66/83  soft-tissue]
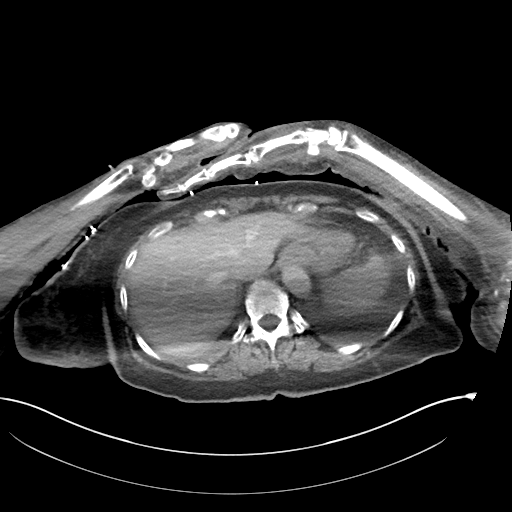
[im 70/83  soft-tissue]
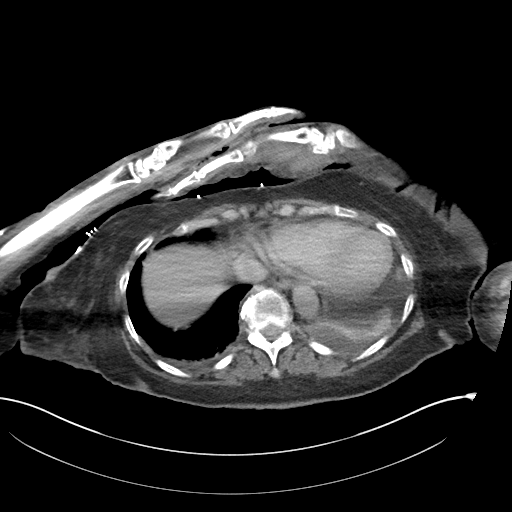
[im 78/83  soft-tissue]
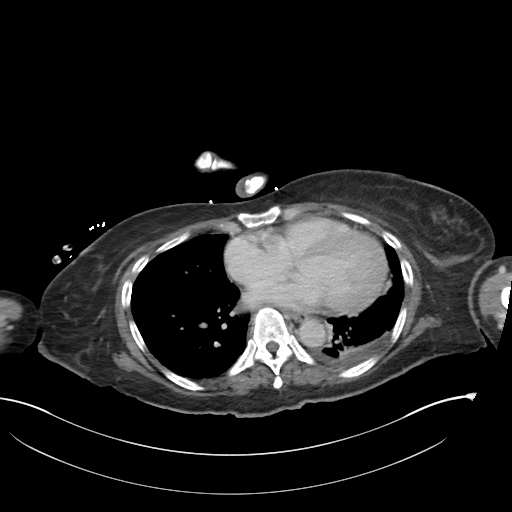

[Series 6: coronal soft tissue · coronal · 0.80mm/px · 3 of 101 slices shown]
[im 34/101  soft-tissue]
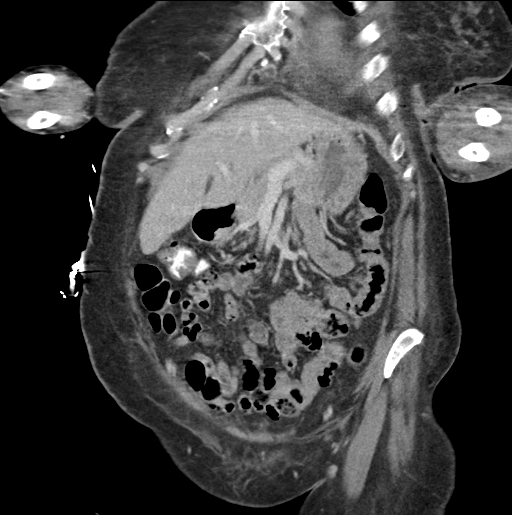
[im 45/101  soft-tissue]
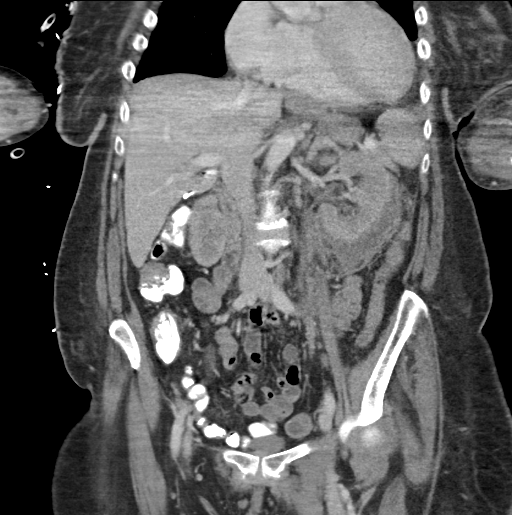
[im 56/101  soft-tissue]
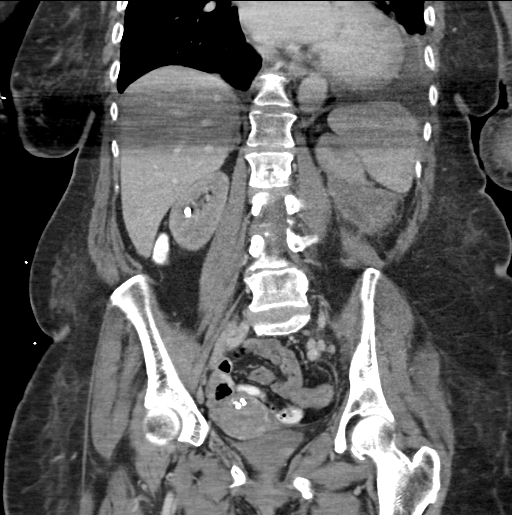

[16 of 46 positions shown; findings below may reference images not displayed]

FINDINGS: Lower chest: Mild to moderate severity atelectasis is seen within
the left lung base. Mild atelectasis is also noted along the
posterior aspect of the right lung base. These areas are decreased
in severity when compared to the prior study.

There is a small left pleural effusion. This is mildly decreased in
size.

Hepatobiliary: No focal liver abnormality is seen. Status post
cholecystectomy. No biliary dilatation.

Pancreas: Unremarkable. No pancreatic ductal dilatation or
surrounding inflammatory changes.

Spleen: Normal in size without focal abnormality.

Adrenals/Urinary Tract: Adrenal glands are unremarkable. The right
kidney is normal in size, without obstructing renal calculi, focal
lesion, or hydronephrosis. A 7 mm nonobstructing renal stone is seen
within the right kidney. An evolving left perinephric hematoma is
seen. This measures approximately 1.8 cm in maximum thickness. No
acute hemorrhagic component is seen. A mild amount of adjacent
inflammatory fat stranding is present. The urinary bladder is
partially contracted and subsequently limited in evaluation.

Stomach/Bowel: Stomach is within normal limits. The appendix is not
identified. No evidence of bowel wall thickening, distention, or
inflammatory changes.

Vascular/Lymphatic: No significant vascular findings are present. No
enlarged abdominal or pelvic lymph nodes.

Reproductive: Multiple parenchymal calcifications are seen within
the lobulated uterus. The bilateral adnexa are unremarkable.

Other: No abdominal wall hernia or abnormality. No abdominopelvic
ascites.

Musculoskeletal: Degenerative changes are seen within the lumbar
spine.
IMPRESSION: 1. Evolving left perinephric hematoma, without evidence of acute
hemorrhagic component.
2. 7 mm nonobstructing renal stone within the right kidney.
3. Bibasilar atelectasis, left greater than right.
4. Small left pleural effusion.
5. Evidence of prior cholecystectomy.

## 2020-03-16 MED ORDER — IOHEXOL 9 MG/ML PO SOLN
ORAL | Status: AC
  Administered 2020-03-16: 500 mL
  Filled 2020-03-16: qty 1000

## 2020-03-16 MED ORDER — PREDNISONE 20 MG PO TABS: 40.0000 mg | ORAL_TABLET | Freq: Every day | ORAL | Status: DC

## 2020-03-16 MED ORDER — ACETAMINOPHEN 325 MG PO TABS
650.0000 mg | ORAL_TABLET | Freq: Once | ORAL | Status: AC
  Administered 2020-03-16: 650 mg via ORAL
  Filled 2020-03-16: qty 2

## 2020-03-16 MED ORDER — IOHEXOL 300 MG/ML  SOLN
75.0000 mL | Freq: Once | INTRAMUSCULAR | Status: AC | PRN
  Administered 2020-03-16: 75 mL via INTRAVENOUS

## 2020-03-16 MED ORDER — ACETAMINOPHEN 325 MG PO TABS
325.0000 mg | ORAL_TABLET | Freq: Once | ORAL | Status: AC
  Administered 2020-03-16: 325 mg via ORAL
  Filled 2020-03-16: qty 1

## 2020-03-16 MED ORDER — ACETAMINOPHEN 325 MG PO TABS
650.0000 mg | ORAL_TABLET | Freq: Four times a day (QID) | ORAL | Status: DC | PRN
  Administered 2020-03-16 – 2020-03-29 (×4): 650 mg via ORAL
  Filled 2020-03-16 (×4): qty 2

## 2020-03-16 MED ORDER — DARBEPOETIN ALFA 60 MCG/0.3ML IJ SOSY
60.0000 ug | PREFILLED_SYRINGE | INTRAMUSCULAR | Status: DC
  Filled 2020-03-16 (×2): qty 0.3

## 2020-03-16 NOTE — Progress Notes (Signed)
PROGRESS NOTE    Barbara Crawford   OTR:[id number]  DOB: May 07, 1955  DOA: 02/25/2020     20  PCP: Vonna Drafts, FNP  Brief narrative/Hospital Course:  [name] is a 65 yo female with past medical history of hypertension, prior Covid (Dec 2021) presented to the hospital with complaints of lower extremity swelling, back pain, generalized malaise, fatigue at home. In the ED, she was found to have a hemoglobin of 6.3, potassium 5.4, sodium 130, bicarb 9, BUN 144, and creatinine 29.98.  CT of the abdomen and pelvis was negative for obstruction or acute abnormality.  Temporary hemodialysis catheter was placed by IR and subsequently, the patient had a PEA arrest associated with seizure-like activity.  She was intubated and subsequently extubated 02/28/2020.   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 Renal biopsy done 03/03/2019.  ANA positive.  ID followed the patient for fever.Trialysis cath was removed on 03/06/20 and subsequently underwent permacath placement.  At this time, patient has still has a spike of fever.  Extensive work-up was done in the past.  Recent work-up showed consolidation in the lungs.  Patient has been started on vancomycin and cefepime.  Spoke with infectious disease and nephrology.  There is possibility of autoimmune disease causing kidney disease and fever.  Likely need home health on discharge.  Assessment & Plan:  Principal Problem:   Acute renal failure (HCC) Active Problems:   Normocytic anemia   Uremia   HTN (hypertension)   Perinephric hematoma   Seizure (HCC)   Lobar pneumonia (HCC)   Sepsis (HCC) versus SIRS due to autoimmune process   Fever   Glomerulonephritis   ABLA (acute blood loss anemia)   Pleural effusion   Encounter for orogastric (OG) tube placement   FSGS (focal segmental glomerulosclerosis)  Fever, cough, shortness of breath, dyspnea.  Shortness of breath and dyspnea has improved.  Still has  fever of 100.2 F.  Being empirically treated for possible healthcare history pneumonia. Status post extubation 02/28/2020.  Recent chest x-ray shows consolidation. On IV vancomycin and cefepime.  Recent blood cultures negative in 3 days. Duplex ultrasound was negative for DVT.  Continue incentive spirometry.  CT chest on 03/06/2020 showed bilateral effusion and atelectasis.  CBC at 8.4.  Temperature max of 102.4 F.  Spoke with Dr. Drucilla Schmidt infectious disease.  Recommends scanning.  Communicated with nephrology about it as well.  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  Developed a perinephric hematoma post biopsy.    Status post IR guided internal jugular tunneled hemodialysis catheter on 03/09/2020.  Awaiting for outpatient hemodialysis arrangement.  Pleural effusion Continue hemodialysis for volume management.  Patient does have hypoalbuminemia as well.  Moderate sized pleural effusion on CT on 3/4; no obvious HF on echo (EF 50-55%).  X-ray from 03/13/2020 showed increasing left basilar consolidation and effusion.  Acute blood loss anemia Stable at this time.  Continue to monitor hemoglobin.  Latest hemoglobin of 7.6  Acute glomerulonephritis Status post renal biopsy which showed a focal segmental glomerulosclerosis.  Nephrology on board.  Spoke with Dr. Posey Pronto today.  Might benefit from prednisone p.o.  For possible autoimmune etiology.  Seizure  Patient had seizure with lossof pulse and PEA arrest s/p CPR.  Was seen by neurology.  Plan is to continue Keppra on discharge  Perinephric hematoma Seen on CT abdomen/pelvis on 03/03/2020.  Continue to monitor closely.  hemoglobin of  7.6 today. CBC Latest Ref Rng & Units 03/16/2020 03/15/2020 03/14/2020  WBC 4.0 - 10.5 K/uL 8.4 10.1 10.3  Hemoglobin 12.0 - 15.0 g/dL 7.6(L) 8.1(L) 8.7(L)  Hematocrit 36.0 - 46.0 % 23.3(L) 24.2(L) 26.7(L)  Platelets 150 - 400 K/uL 276 268 269     Essential hypertension Continue with nifedipine, metoprolol and labetalol as needed.    Slightly high blood pressure today but overall improved after increasing metoprolol to 75 twice daily.Marland Kitchen  Uremia Resolved.  Likely secondary to to AKI.  Continue hemodialysis.  Normocytic anemia Status post kidney biopsy and perinephric hematoma.  Status post 2 units of packed RBC on 03/03/2020.  Continue Aranesp as per nephrology.  Recent hemoglobin of 7.6.  Acute on chronic respiratory failure with hypoxia  Resolved.  Status post PEA arrest.  Extubated on 02/28/2020.  Currently on room air  Cardiac arrest with pulseless electrical activity  Status post CPR and intubation.  Stable at this time.  Debility, deconditioning,  Will need home PT on discharge.  Antimicrobials: None at this time.  DVT prophylaxis: Sequential compression device due to recent hematoma     Code Status: Full Code    Family Communication:  None today.   Disposition Plan: Status is: Inpatient  Remains inpatient appropriate because:, IV treatments appropriate due to intensity of illness or inability to take PO and Inpatient level of care appropriate due to severity of illness, will need hemodialysis set up as outpatient, new fever- possible pneumonia  Dispo:  Patient From: Home  Planned Disposition: Home with Health Care Svc   Anticipated date of discharge.  2 to 3 days, continue to monitor  fever trend, await OP HD setup.  Medically stable for discharge: No  Subjective Today, patient was seen and examined at bedside.  Still has fever but feels little better with breathing no chest pain has mild dry cough.   Objective:  Vitals with BMI 03/16/2020 03/16/2020 03/16/2020  Height - - -  Weight - - -  BMI - - -  Systolic [phone]  Diastolic 80 69 65  Pulse 326 92 92   Body mass index is 26.56 kg/m.  Physical Examination: General:  Average built, not in obvious distress alert awake oriented HENT:   No scleral  pallor or icterus noted. Oral mucosa is moist.  Right internal jugular permacath in place without any induration or erythema. Chest:   Diminished breath sounds bilaterally.  CVS: S1 &S2 heard. No murmur.  Regular rate and rhythm. Abdomen: Soft, nontender, nondistended.  Bowel sounds are heard.   Extremities: No cyanosis, clubbing but bilateral lower extremity pitting edema noted..  Peripheral pulses are palpable.  Left upper extremity brachiocephalic fistula site with mild irritation. Psych: Alert, awake and oriented, normal mood CNS:  No cranial nerve deficits.  Power equal in all extremities.   Skin: Warm and dry.  No rashes noted.  Consultants:   ID  Nephrology  Procedures:    Hemodialysis   internal jugular tunneled hemodialysis catheter on 03/09/2020.  Left brachial cephalic fistula placement on 03/13/2020  Data Reviewed: I have personally reviewed following labs and imaging studies  Results for orders placed or performed during the hospital encounter of 02/25/20 (from the past 24 hour(s))  Glucose, capillary     Status: None   Collection Time: 03/15/20 12:08 PM  Result Value Ref Range   Glucose-Capillary 76 70 - 99 mg/dL  Glucose, capillary     Status: None   Collection Time: 03/15/20  3:55 PM  Result Value Ref Range   Glucose-Capillary 85 70 - 99 mg/dL  Glucose, capillary     Status: Abnormal   Collection Time: 03/15/20  8:37 PM  Result Value Ref Range   Glucose-Capillary 100 (H) 70 - 99 mg/dL  Basic metabolic panel     Status: Abnormal   Collection Time: 03/16/20  2:48 AM  Result Value Ref Range   Sodium 133 (L) 135 - 145 mmol/L   Potassium 3.7 3.5 - 5.1 mmol/L   Chloride 97 (L) 98 - 111 mmol/L   CO2 24 22 - 32 mmol/L   Glucose, Bld 83 70 - 99 mg/dL   BUN 30 (H) 8 - 23 mg/dL   Creatinine, Ser 7.35 (H) 0.44 - 1.00 mg/dL   Calcium 7.8 (L) 8.9 - 10.3 mg/dL   GFR, Estimated 6 (L) >60 mL/min   Anion gap 12 5 - 15  CBC     Status: Abnormal   Collection Time:  03/16/20  2:48 AM  Result Value Ref Range   WBC 8.4 4.0 - 10.5 K/uL   RBC 2.57 (L) 3.87 - 5.11 MIL/uL   Hemoglobin 7.6 (L) 12.0 - 15.0 g/dL   HCT 23.3 (L) 36.0 - 46.0 %   MCV 90.7 80.0 - 100.0 fL   MCH 29.6 26.0 - 34.0 pg   MCHC 32.6 30.0 - 36.0 g/dL   RDW 15.2 11.5 - 15.5 %   Platelets 276 150 - 400 K/uL   nRBC 0.0 0.0 - 0.2 %  Magnesium     Status: None   Collection Time: 03/16/20  2:48 AM  Result Value Ref Range   Magnesium 1.9 1.7 - 2.4 mg/dL  Glucose, capillary     Status: None   Collection Time: 03/16/20  7:49 AM  Result Value Ref Range   Glucose-Capillary 79 70 - 99 mg/dL    Recent Results (from the past 240 hour(s))  Culture, blood (routine x 2)     Status: None (Preliminary result)   Collection Time: 03/13/20  9:35 PM   Specimen: BLOOD  Result Value Ref Range Status   Specimen Description BLOOD RIGHT ARM  Final   Special Requests   Final    BOTTLES DRAWN AEROBIC AND ANAEROBIC Blood Culture adequate volume   Culture   Final    NO GROWTH 3 DAYS Performed at [facility], [address], [place], [place] [address]    Report Status PENDING  Incomplete  Culture, blood (routine x 2)     Status: None (Preliminary result)   Collection Time: 03/13/20  9:41 PM   Specimen: BLOOD  Result Value Ref Range Status   Specimen Description BLOOD RIGHT HAND  Final   Special Requests   Final    BOTTLES DRAWN AEROBIC AND ANAEROBIC Blood Culture adequate volume   Culture   Final    NO GROWTH 3 DAYS Performed at [facility], [place] [address], [place], [place] [address]    Report Status PENDING  Incomplete  Culture, Urine     Status: None   Collection Time: 03/14/20  3:00 AM   Specimen: Urine, Random  Result Value Ref Range Status   Specimen Description URINE, RANDOM  Final   Special Requests NONE  Final   Culture   Final    NO GROWTH Performed at [facility], [address] [place], [place] [address]    Report Status 03/15/2020 FINAL  Final     Radiology  Studies: No results  found. DG Chest Port 1 View  Final Result    VAS [place] UPPER EXT VEIN MAPPING (PRE-OP AVF)  Final Result    IR Fluoro Guide CV Line Right  Final Result    IR US Guide Vasc Access Right  Final Result    VAS [place] LOWER EXTREMITY VENOUS (DVT)  Final Result    VAS [place] UPPER EXTREMITY VENOUS DUPLEX  Final Result    CT CHEST WO CONTRAST  Final Result    CT ABDOMEN PELVIS WO CONTRAST  Final Result    US BIOPSY (KIDNEY)  Final Result    DG Chest 2 View  Final Result    DG CHEST PORT 1 VIEW  Final Result    MR BRAIN WO CONTRAST  Final Result    MR ANGIO HEAD WO CONTRAST  Final Result    CT HEAD WO CONTRAST  Final Result    DG Abd 1 View  Final Result    DG CHEST PORT 1 VIEW  Final Result    DG Chest Port 1 View  Final Result    IR Fluoro Guide CV Line Right  Final Result    IR US Guide Vasc Access Right  Final Result    US RENAL  Final Result    CT Renal Stone Study  Final Result      Scheduled Meds: . chlorhexidine gluconate (MEDLINE KIT)  15 mL Mouth Rinse BID  . Chlorhexidine Gluconate Cloth  6 each Topical Q0600  . [START ON 03/17/2020] darbepoetin (ARANESP) injection - DIALYSIS  60 mcg Intravenous Q Tue-HD  . dextromethorphan  30 mg Oral BID  . docusate sodium  100 mg Oral BID  . feeding supplement  237 mL Oral TID BM  . levETIRAcetam  250 mg Oral Q T,Th,Sa-HD  . levETIRAcetam  500 mg Oral BID  . mouth rinse  15 mL Mouth Rinse BID  . metoprolol tartrate  75 mg Oral BID  . multivitamin  1 tablet Oral QHS  . NIFEdipine  90 mg Oral Daily   PRN Meds: guaiFENesin, labetalol, LORazepam, melatonin, menthol-cetylpyridinium, ondansetron **OR** ondansetron (ZOFRAN) IV, oxyCODONE Continuous Infusions: . sodium chloride 10 mL/hr at 03/14/20 1441  . ceFEPime (MAXIPIME) IV 2 g (03/14/20 1443)  . [START ON 03/17/2020] vancomycin       LOS: 20 days    Flora Lipps, MD [facility] 03/16/2020, 10:35 AM

## 2020-03-16 NOTE — Progress Notes (Addendum)
Patient ID: Barbara Crawford, female   DOB: [birth year], 65 y.o.   MRN: [id number] [facility] Progress Note   Assessment/ Plan:   1.  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  Continue dialysis on TTS schedule with process underway for outpatient dialysis unit placement.  She had creation of a left brachiocephalic fistula by Dr. Carlis Abbott on 3/11 and is currently getting hemodialysis via a right IJ tunneled hemodialysis catheter. 2.  Suspected healthcare associated pneumonia: On intravenous cefepime and vancomycin and remains intermittently febrile. 3.  Anemia: Likely from underlying chronic disease and surgical losses.  Will increase dose of Aranesp. 4.  Hypertension: Blood pressure under decent control, continue to monitor on current medication/hemodialysis. 5.  Secondary hyperparathyroidism: Calcium and phosphorus under acceptable control, not on binders at this time.  PTH level was 193 on 2/23-at goal for ESRD. 6.  Hyponatremia: We will begin fluid restriction of 1.2 L/day in the setting of end-stage renal disease-I discussed this with the patient.  Addendum: With persistent fevers in the setting of adequate coverage for healthcare associated pneumonia and negative cultures so far.  I agree with the plan for CT scan of the abdomen and pelvis with intravenous contrast and if negative, would support a time-limited trial of prednisone 40 mg daily to taper off in the next 3 to 4 weeks for suspected autoimmune mechanism.  I reviewed her database and suspect that her previous antinuclear antibody may have been a false positive however, renal biopsy findings raise some concern for antibody mediated mechanism.  Subjective:   Reports some fevers overnight (T-max 102.32F overnight).  Denies any chest  pain and has some itchiness over dialysis catheter.   Objective:   BP (!) 154/80 (BP Location: Right Arm)   Pulse (!) 103   Temp 100.2 F (37.9 C) (Oral)   Resp 20   Ht 5' 2.99" (1.6 m)   Wt 68 kg   SpO2 98%   BMI 26.56 kg/m  No intake or output data in the 24 hours ending 03/16/20 0803 Weight change:   Physical Exam: Gen: Comfortably sitting up in recliner CVS: Pulse regular tachycardia, S1 and S2 normal Resp: Clear to auscultation bilaterally.  Right IJ TDC in place. Abd: Soft, flat, nontender, bowel sounds normal Ext: 1-2+ bilateral lower extremity pitting edema.  Palpable thrill left BCF.  Imaging: No results found.  Labs: BMET Recent Labs  Lab 03/10/20 0025 03/11/20 0206 03/12/20 0019 03/13/20 1007 03/14/20 0133 03/15/20 0419 03/16/20 0248  NA 131* 133* 132* 134* 132* 133* 133*  K 3.7 3.5 3.6 3.6 3.9 3.8 3.7  CL 98 99 97* 97* 96* 98 97*  CO2 18* 23 22  --  _0 GLUCOSE 87 93 104* 92 90 81 83  BUN 56* 32* 42* 24* 30* 19 30*  CREATININE 9.41* 6.59* 8.02* 6.00* 7.16* 5.37* 7.35*  CALCIUM 7.6* 7.7* 7.9*  --  7.7* 7.8* 7.8*  PHOS 5.7* 3.8 4.2  --   --  4.0  --    CBC Recent Labs  Lab 03/10/20 0025 03/11/20 0206 03/12/20 0019 03/13/20 1007 03/14/20 0133 03/15/20 0419 03/16/20 0248  WBC 10.1 9.2 10.1  --  10.3 10.1 8.4  NEUTROABS 8.0* 6.7  --   --   --   --   --   HGB 8.7* 8.4* 9.2*  8.8* 8.7* 8.1* 7.6*  HCT 26.0* 24.5* 28.2* 26.0* 26.7* 24.2* 23.3*  MCV 89.0 88.1 90.1  --  90.2 89.3 90.7  PLT 293 232 298  --  269 268 276    Medications:    . acetaminophen  325 mg Oral Once  . chlorhexidine gluconate (MEDLINE KIT)  15 mL Mouth Rinse BID  . Chlorhexidine Gluconate Cloth  6 each Topical Q0600  . [START ON 03/17/2020] darbepoetin (ARANESP) injection - DIALYSIS  40 mcg Intravenous Q Tue-HD  . dextromethorphan  30 mg Oral BID  . docusate sodium  100 mg Oral BID  . feeding supplement  237 mL Oral TID BM  . levETIRAcetam  250 mg Oral Q T,Th,Sa-HD   . levETIRAcetam  500 mg Oral BID  . mouth rinse  15 mL Mouth Rinse BID  . metoprolol tartrate  75 mg Oral BID  . multivitamin  1 tablet Oral QHS  . NIFEdipine  90 mg Oral Daily    Elmarie Shiley, MD 03/16/2020, 8:03 AM

## 2020-03-16 NOTE — Progress Notes (Signed)
2210 Temp 102.4, Tylenol 1000 mg given po. Dr Cyd Silence was notified will cont to monitor.  2325 Temp decreased to 100.4, pt drinking and using I/S, will cont to monitor.

## 2020-03-16 NOTE — Progress Notes (Signed)
   03/16/20 1030  Clinical Encounter Type  Visited With Patient  Visit Type Initial  Referral From Nurse  Consult/Referral To Chaplain  Chaplain responded to the consult. Patient stated she needed personal attention. I requested NT3 to assist the patient with personal hygiene. I waited and was able to spend quality time with the patient. She stated she has lived a vibrant life and is accustomed to being independent. Stated she has been through a lot and is concerned she may not be able to have the life she used to. She stated she believes her illness started because she is not vaccinated. She stated her sickness began because she is not vaccinated. Her back pain began after she had COVID, and she was unable to walk. Stated her daughter bathed and cleaned her when she was incontinent. She stated she is concerned about her finances. She stated she purchase a 2019 vehicle. She smiled while talking about her ability to purchase a car. She is also concern about her social worker finding a dialysis facility so she can be discharged. [name] and I prayed together, and I advised Chaplains remain available. This note was prepared by Jeanine Luz, M.Div..  For questions please contact me by phone [phone].

## 2020-03-16 NOTE — Progress Notes (Signed)
Occupational Therapy Treatment Patient Details Name: Barbara Crawford MRN: [id number] DOB: 1955/06/30 Today's Date: 03/16/2020    History of present illness 65 y.o. female with medical history significant of HTN. Pt had COVID in Dec, 1 week prior to presenting to ED 02/25/20 she developed swelling in BLE. Developed pain in B lower back; CT abd/pelvis - no obstruction or acute abnormality. HGB 6.3, +acute renal failure; 2/23 seizure and Code Blue with CPR x 4 minutes and intubated 2/23-2/25; CRRT; renal biopsy 2/28 with perinephric hematoma; pneumonia;  IJ catheter placed and HD initiated. Plans to have dialysis access placed 3/11   OT comments  Pt progressing well with therapy. Pt education on energy conservation and AE for LB ADL. Pt received handouts. Pt able to recall ways to conserve energy at home. Pt reports feeling worn out and wants to rest this PM. OT session focused on education as pt stating she may be home some of the time. Pt would benefit from continued OT skilled services. OT following acutely.   Follow Up Recommendations  Home health OT;Supervision/Assistance - 24 hour    Equipment Recommendations  3 in 1 bedside commode;Tub/shower seat    Recommendations for Other Services      Precautions / Restrictions Precautions Precautions: Fall;Other (comment) Precaution Comments: Monitor HR and RR Restrictions Weight Bearing Restrictions: No       Mobility Bed Mobility               General bed mobility comments: in recliner pre and post session    Transfers Overall transfer level: Needs assistance Equipment used: None Transfers: Sit to/from Stand Sit to Stand: Supervision         General transfer comment: no RW    Balance Overall balance assessment: Needs assistance Sitting-balance support: No upper extremity supported;Feet supported Sitting balance-Leahy Scale: Good                                     ADL either performed or  assessed with clinical judgement   ADL Overall ADL's : Needs assistance/impaired                     Lower Body Dressing: Minimal assistance;Sitting/lateral leans;Sit to/from stand;Cueing for safety Lower Body Dressing Details (indicate cue type and reason): education for AE             Functional mobility during ADLs: Minimal assistance;Rolling walker General ADL Comments: Pt education on energy conservation and AE for LB ADL. Pt received handouts. Pt able to recall ways to conserve energy at home. pt reports feeling worn out and wants to rest this PM. OT session focused on education as pt stating she may be home some of the time     Vision   Vision Assessment?: No apparent visual deficits   Perception     Praxis      Cognition Arousal/Alertness: Awake/alert Behavior During Therapy: Flat affect Overall Cognitive Status: No family/caregiver present to determine baseline cognitive functioning                               Problem Solving: Slow processing General Comments: Increased time today and when handouts provided pt reports being appreciative as pt takes longer to process        Exercises     Shoulder Instructions  General Comments VSS on RA    Pertinent Vitals/ Pain       Pain Assessment: No/denies pain  Home Living                                          Prior Functioning/Environment              Frequency  Min 2X/week        Progress Toward Goals  OT Goals(current goals can now be found in the care plan section)  Progress towards OT goals: Progressing toward goals  Acute Rehab OT Goals Patient Stated Goal: To return home. OT Goal Formulation: With patient Time For Goal Achievement: 03/19/20 Potential to Achieve Goals: Good ADL Goals Pt Will Perform Grooming: standing;with supervision Pt Will Perform Lower Body Dressing: with supervision;sit to/from stand Pt Will Transfer to Toilet:  ambulating;with supervision Pt Will Perform Toileting - Clothing Manipulation and hygiene: with supervision;sit to/from stand Pt Will Perform Tub/Shower Transfer: with supervision;3 in 1 Pt/caregiver will Perform Home Exercise Program: Both right and left upper extremity;Increased strength;With written HEP provided  Plan Discharge plan remains appropriate;Frequency remains appropriate    Co-evaluation                 AM-PAC OT "6 Clicks" Daily Activity     Outcome Measure   Help from another person eating meals?: A Little Help from another person taking care of personal grooming?: A Little Help from another person toileting, which includes using toliet, bedpan, or urinal?: A Little Help from another person bathing (including washing, rinsing, drying)?: A Lot Help from another person to put on and taking off regular upper body clothing?: A Little Help from another person to put on and taking off regular lower body clothing?: A Lot 6 Click Score: 16    End of Session Equipment Utilized During Treatment: Rolling walker  OT Visit Diagnosis: Unsteadiness on feet (R26.81);Muscle weakness (generalized) (M62.81)   Activity Tolerance Patient tolerated treatment well   Patient Left in chair;with call bell/phone within reach;with chair alarm set   Nurse Communication Mobility status        Time: [id number] OT Time Calculation (min): 25 min  Charges: OT General Charges $OT Visit: 1 Visit OT Treatments $Self Care/Home Management : 8-22 mins $Therapeutic Activity: 8-22 mins  Jefferey Pica, OTR/L Acute Rehabilitation Services Pager: [phone] Office: [phone]    Shiron Whetsel C 03/16/2020, 5:15 PM

## 2020-03-16 NOTE — Progress Notes (Signed)
Renal Navigator called [facility] to follow up on referral for outpatient HD. [name] is not in the office today and Navigator was told that she will be back tomorrow. Navigator will follow up tomorrow.  Alphonzo Cruise, [place] Renal Navigator [phone]

## 2020-03-16 NOTE — Progress Notes (Signed)
0530 Temp elevated to 101.2, Dr Cyd Silence notified, unable to give Tylenol until 0600, will cont to monitor.

## 2020-03-16 NOTE — Consult Note (Signed)
Date of Admission:  02/25/2020          Reason for Consult: FUO    Referring Provider:  Dr. Grier Rocher   Assessment:   1.   FUO 2.   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   COVID 19 infection in February 4.  CT showing likely hematoma at site of renal biopsy  Plan:  1. CT abdomen and pelvis with IV and oral contrast to evaluate for intra-abdominal source of infection 2. If #1 unrevealing will repeat CT scan of the chest 3. If she still has effusions would recommend diagnostic and therapeutic thoracenteses with fluid sent for cell count differential protein glucose LDH albumin 4. Send further FUO labs  Principal Problem:   Acute renal failure ([place]) Active Problems:   Normocytic anemia   Uremia   HTN (hypertension)   Perinephric hematoma   Seizure (HCC)   Lobar pneumonia (HCC)   Sepsis (HCC) versus SIRS due to autoimmune process   Fever   Glomerulonephritis   ABLA (acute blood loss anemia)   Pleural effusion   Encounter for orogastric (OG) tube placement   FSGS (focal segmental glomerulosclerosis)   Scheduled Meds: . chlorhexidine gluconate (MEDLINE KIT)  15 mL Mouth Rinse BID  . Chlorhexidine Gluconate Cloth  6 each Topical Q0600  . [START ON 03/17/2020] darbepoetin (ARANESP) injection - DIALYSIS  60 mcg Intravenous Q Tue-HD  . dextromethorphan  30 mg Oral BID  . docusate sodium  100 mg Oral BID  . feeding supplement  237 mL Oral TID BM  . levETIRAcetam  250 mg Oral Q T,Th,Sa-HD  . levETIRAcetam  500 mg Oral BID  . mouth rinse  15 mL Mouth Rinse BID  . metoprolol tartrate  75 mg Oral BID  . multivitamin  1 tablet Oral QHS  . NIFEdipine  90 mg Oral Daily   Continuous Infusions: . sodium chloride 10 mL/hr at 03/14/20 1441  . ceFEPime (MAXIPIME) IV 2 g (03/14/20 1443)  . [START ON 03/17/2020] vancomycin     PRN Meds:.guaiFENesin, labetalol, LORazepam, melatonin, menthol-cetylpyridinium, ondansetron **OR** ondansetron (ZOFRAN) IV, oxyCODONE  HPI:  Barbara Crawford is a 66 y.o. female who is admitted with severe low back pain and fatigue with lower extremity edema and found to have nephrotic syndrome with acute kidney injury.  She underwent renal biopsy which showed FSGS.  She had a fever that was ongoing throughout the beginning of her hospital stay.  Work-up was largely unrevealing.  She did have a CT of the abdomen pelvis done without contrast that it shown what appeared to be a hematoma at the site of her renal biopsy.  CT of the chest showed some pleural effusions but is otherwise unremarkable.  Her blood cultures remain sterile.  Back pain had resolved.  Neurologic work-up was negative for HIV with negative HIV antibodies HIV viral load that was undetectable hepatitis B serologies and hepatitis C were negative.  QuantiFERON gold was indeterminate.  Brucella antibodies were negative CMV IgM and IgG were negative CMV PCR was negative Epstein-Barr virus serologies were consistent with past infection.  Currently her dialysis catheter was removed and her fevers seem to resolve and we signed off.  In the interim she had placement of a new dialysis catheter as well as vascular surgery for creation of a brachiocephalic fistula and a new HD catheter was placed.  She then has had recurrence of her fevers up to 102.9 on 11 March.  Blood  cultures were taken at that time which have not shown any growth.  She was started on vancomycin to which cefepime was added chest x-ray showed possible consolidation in the left lower lobe.  She has continued to have fevers despite aggressive antibiotic therapy.  Point in time I would like to get more aggressive imaging of her abdomen with a CT with contrast which my partner wanted to do if it became more clear that the patient was not going to likely recover renal function.  Discussed this case with Dr. Posey Pronto who feels it is okay for [place] to give contrast at this point.  This will also help better informed  decision of whether or not they will feel comfortable initiating corticosteroid therapy.  My suspicion remains that she has a noninfectious autoimmune process driving her pathology including her fevers.    Review of Systems: Review of Systems  Constitutional: Positive for fever and malaise/fatigue. Negative for chills and weight loss.  HENT: Negative for congestion and sore throat.   Eyes: Negative for blurred vision and photophobia.  Respiratory: Positive for cough. Negative for shortness of breath and wheezing.   Cardiovascular: Negative for chest pain, palpitations and leg swelling.  Gastrointestinal: Positive for diarrhea. Negative for abdominal pain, blood in stool, constipation, heartburn, melena, nausea and vomiting.  Genitourinary: Negative for dysuria, flank pain and hematuria.  Musculoskeletal: Positive for back pain. Negative for falls, joint pain and myalgias.  Skin: Negative for itching and rash.  Neurological: Negative for dizziness, focal weakness, loss of consciousness, weakness and headaches.  Endo/Heme/Allergies: Does not bruise/bleed easily.  Psychiatric/Behavioral: Positive for depression. Negative for suicidal ideas. The patient does not have insomnia.     Past Medical History:  Diagnosis Date  . Hypertension     Social History   Tobacco Use  . Smoking status: Never Smoker  . Smokeless tobacco: Never Used  Vaping Use  . Vaping Use: Never used  Substance Use Topics  . Alcohol use: Yes  . Drug use: No    History reviewed. No pertinent family history. Allergies  Allergen Reactions  . Erythromycin Other (See Comments)    Pt does not recall  . Iodine Other (See Comments)    Arm flares up per pt  . Penicillins     Has patient had a PCN reaction causing immediate rash, facial/tongue/throat swelling, SOB or lightheadedness with hypotension: no Has patient had a PCN reaction causing severe rash involving mucus membranes or skin necrosis: yes Has patient  had a PCN reaction that required hospitalization: no Has patient had a PCN reaction occurring within the last 10 years: no If all of the above answers are "NO", then may proceed with Cephalosporin use.  Marland [employer] Shellfish-Derived Products     OBJECTIVE: Blood pressure (!) 154/80, pulse (!) 103, temperature 100.2 F (37.9 C), temperature source Oral, resp. rate 20, height 5' 2.99" (1.6 m), weight 68 kg, SpO2 98 %.  Physical Exam Constitutional:      General: She is not in acute distress.    Appearance: Normal appearance. She is well-developed. She is not ill-appearing or diaphoretic.  HENT:     Head: Normocephalic and atraumatic.     Right Ear: Hearing and external ear normal.     Left Ear: Hearing and external ear normal.     Nose: No nasal deformity or rhinorrhea.  Eyes:     General: No scleral icterus.    Conjunctiva/sclera: Conjunctivae normal.     Right eye: Right conjunctiva is not injected.  Left eye: Left conjunctiva is not injected.     Pupils: Pupils are equal, round, and reactive to light.  Neck:     Vascular: No JVD.  Cardiovascular:     Rate and Rhythm: Normal rate and regular rhythm.     Heart sounds: Normal heart sounds, S1 normal and S2 normal. No murmur heard. No friction rub. No gallop.   Pulmonary:     Effort: Pulmonary effort is normal. No respiratory distress.     Breath sounds: Normal breath sounds. No wheezing or rhonchi.  Abdominal:     General: Bowel sounds are normal. There is no distension.     Palpations: Abdomen is soft.     Tenderness: There is no abdominal tenderness.  Musculoskeletal:        General: Normal range of motion.     Right shoulder: Normal.     Left shoulder: Normal.     Cervical back: Normal range of motion and neck supple.     Right hip: Normal.     Left hip: Normal.     Right knee: Normal.     Left knee: Normal.  Lymphadenopathy:     Head:     Right side of head: No submandibular, preauricular or posterior auricular adenopathy.      Left side of head: No submandibular, preauricular or posterior auricular adenopathy.     Cervical: No cervical adenopathy.     Right cervical: No superficial or deep cervical adenopathy.    Left cervical: No superficial or deep cervical adenopathy.  Skin:    General: Skin is warm and dry.     Coloration: Skin is not pale.     Findings: No abrasion, bruising, ecchymosis, erythema, lesion or rash.     Nails: There is no clubbing.  Neurological:     General: No focal deficit present.     Mental Status: She is alert and oriented to person, place, and time.     Sensory: No sensory deficit.     Coordination: Coordination normal.     Gait: Gait normal.  Psychiatric:        Attention and Perception: Attention normal. She is attentive.        Mood and Affect: Mood is depressed.        Speech: Speech normal.        Behavior: Behavior normal. Behavior is cooperative.        Thought Content: Thought content normal.        Cognition and Memory: Cognition and memory normal.        Judgment: Judgment normal.   Her brachiocephalic fistula sites clean.  Her dialysis catheter does not have any induration or tenderness or erythema  Lab Results Lab Results  Component Value Date   WBC 8.4 03/16/2020   HGB 7.6 (L) 03/16/2020   HCT 23.3 (L) 03/16/2020   MCV 90.7 03/16/2020   PLT 276 03/16/2020    Lab Results  Component Value Date   CREATININE 7.35 (H) 03/16/2020   BUN 30 (H) 03/16/2020   NA 133 (L) 03/16/2020   K 3.7 03/16/2020   CL 97 (L) 03/16/2020   CO2 24 03/16/2020    Lab Results  Component Value Date   ALT 35 03/05/2020   AST 44 (H) 03/05/2020   ALKPHOS 58 03/05/2020   BILITOT 0.6 03/05/2020     Microbiology: Recent Results (from the past 240 hour(s))  Culture, blood (routine x 2)     Status: None (Preliminary result)  Collection Time: 03/13/20  9:35 PM   Specimen: BLOOD  Result Value Ref Range Status   Specimen Description BLOOD RIGHT ARM  Final   Special Requests    Final    BOTTLES DRAWN AEROBIC AND ANAEROBIC Blood Culture adequate volume   Culture   Final    NO GROWTH 3 DAYS Performed at [facility], [address], [place], [place] [address]    Report Status PENDING  Incomplete  Culture, blood (routine x 2)     Status: None (Preliminary result)   Collection Time: 03/13/20  9:41 PM   Specimen: BLOOD  Result Value Ref Range Status   Specimen Description BLOOD RIGHT HAND  Final   Special Requests   Final    BOTTLES DRAWN AEROBIC AND ANAEROBIC Blood Culture adequate volume   Culture   Final    NO GROWTH 3 DAYS Performed at [facility], [place] [address], [place], [place] [address]    Report Status PENDING  Incomplete  Culture, Urine     Status: None   Collection Time: 03/14/20  3:00 AM   Specimen: Urine, Random  Result Value Ref Range Status   Specimen Description URINE, RANDOM  Final   Special Requests NONE  Final   Culture   Final    NO GROWTH Performed at [facility], [place] [address] [place], [place] [address]    Report Status 03/15/2020 FINAL  Final    Alcide Evener, [facility] [phone] pager  03/16/2020, 11:25 AM

## 2020-03-16 NOTE — Progress Notes (Signed)
Physical Therapy Treatment Patient Details Name: Barbara Crawford MRN: [id number] DOB: [birth year] Today's Date: 03/16/2020    History of Present Illness 65 y.o. female with medical history significant of HTN. Pt had COVID in Dec, 1 week prior to presenting to ED 02/25/20 she developed swelling in BLE. Developed pain in B lower back; CT abd/pelvis - no obstruction or acute abnormality. HGB 6.3, +acute renal failure; 2/23 seizure and Code Blue with CPR x 4 minutes and intubated 2/23-2/25; CRRT; renal biopsy 2/28 with perinephric hematoma; pneumonia;  IJ catheter placed and HD initiated. Plans to have dialysis access placed 3/11    PT Comments    Patient tolerated step up/down x 12 reps onto single step at bedside using bed rail for support. HR 104-112 throughout. 1 small imbalance posteriorly required min assist. Breakfast arrived and further ambulation deferred.     Follow Up Recommendations  Home health PT;Supervision/Assistance - 24 hour (initial 24/7 for a few days)     Equipment Recommendations  Rolling walker with 5" wheels    Recommendations for Other Services       Precautions / Restrictions Precautions Precautions: Fall;Other (comment) Precaution Comments: Monitor HR and RR Restrictions Weight Bearing Restrictions: No    Mobility  Bed Mobility                    Transfers Overall transfer level: Needs assistance Equipment used: None Transfers: Sit to/from Stand Sit to Stand: Supervision         General transfer comment: no RW due to standing at bedside to use elevated bedrail as a simulated rail for single step up/downs  Ambulation/Gait                 Stairs Stairs: Yes Stairs assistance: Min assist Stair Management: One rail Right;Step to pattern;Forwards Number of Stairs: 12 General stair comments: holding elevated bed rail; up forward and back down stepping backwards; slight imbalance x 1 required assist   Wheelchair Mobility     Modified Rankin (Stroke Patients Only)       Balance Overall balance assessment: Needs assistance Sitting-balance support: No upper extremity supported;Feet supported Sitting balance-Leahy Scale: Good     Standing balance support: During functional activity Standing balance-Leahy Scale: Fair Standing balance comment: Prefers UE support for walking, able to stand statically without UE support.                            Cognition Arousal/Alertness: Awake/alert Behavior During Therapy: Flat affect Overall Cognitive Status: No family/caregiver present to determine baseline cognitive functioning                               Problem Solving: Slow processing        Exercises      General Comments        Pertinent Vitals/Pain Pain Assessment: No/denies pain    Home Living                      Prior Function            PT Goals (current goals can now be found in the care plan section) Acute Rehab PT Goals Patient Stated Goal: To return home. Time For Goal Achievement: 03/19/20 Potential to Achieve Goals: Good Progress towards PT goals: Progressing toward goals    Frequency    Min 3X/week  PT Plan Current plan remains appropriate    Co-evaluation              AM-PAC PT "6 Clicks" Mobility   Outcome Measure  Help needed turning from your back to your side while in a flat bed without using bedrails?: None Help needed moving from lying on your back to sitting on the side of a flat bed without using bedrails?: A Little Help needed moving to and from a bed to a chair (including a wheelchair)?: A Little Help needed standing up from a chair using your arms (e.g., wheelchair or bedside chair)?: A Little Help needed to walk in hospital room?: A Little Help needed climbing 3-5 steps with a railing? : A Little 6 Click Score: 19    End of Session Equipment Utilized During Treatment: Gait belt Activity Tolerance:  Patient tolerated treatment well Patient left: with call bell/phone within reach;in chair Nurse Communication: Mobility status PT Visit Diagnosis: Unsteadiness on feet (R26.81);Muscle weakness (generalized) (M62.81)     Time: [id number] PT Time Calculation (min) (ACUTE ONLY): 18 min  Charges:  $Gait Training: 8-22 mins                      Arby Barrette, PT Pager [phone]    [name] 03/16/2020, 9:53 AM

## 2020-03-17 ENCOUNTER — Inpatient Hospital Stay (HOSPITAL_COMMUNITY): Payer: 59

## 2020-03-17 DIAGNOSIS — R8271 Bacteriuria: Secondary | ICD-10-CM | POA: Diagnosis not present

## 2020-03-17 DIAGNOSIS — J9621 Acute and chronic respiratory failure with hypoxia: Secondary | ICD-10-CM | POA: Diagnosis not present

## 2020-03-17 DIAGNOSIS — D62 Acute posthemorrhagic anemia: Secondary | ICD-10-CM | POA: Diagnosis not present

## 2020-03-17 DIAGNOSIS — N179 Acute kidney failure, unspecified: Secondary | ICD-10-CM | POA: Diagnosis not present

## 2020-03-17 LAB — ANTI-DNA ANTIBODY, DOUBLE-STRANDED: ds DNA Ab: <1 IU/mL (ref 0–9)

## 2020-03-17 LAB — RENAL FUNCTION PANEL
Albumin: 1.9 g/dL — ABNORMAL LOW (ref 3.5–5.0)
Anion gap: 13 (ref 5–15)
BUN: 50 mg/dL — ABNORMAL HIGH (ref 8–23)
CO2: 23 mmol/L (ref 22–32)
Calcium: 7.7 mg/dL — ABNORMAL LOW (ref 8.9–10.3)
Chloride: 95 mmol/L — ABNORMAL LOW (ref 98–111)
Creatinine, Ser: 10.4 mg/dL — ABNORMAL HIGH (ref 0.44–1.00)
GFR, Estimated: 4 mL/min — ABNORMAL LOW (ref >60)
Glucose, Bld: 124 mg/dL — ABNORMAL HIGH (ref 70–99)
Phosphorus: 5.7 mg/dL — ABNORMAL HIGH (ref 2.5–4.6)
Potassium: 4.1 mmol/L (ref 3.5–5.1)
Sodium: 131 mmol/L — ABNORMAL LOW (ref 135–145)

## 2020-03-17 LAB — CBC
HCT: 22.3 % — ABNORMAL LOW (ref 36.0–46.0)
Hemoglobin: 7.2 g/dL — ABNORMAL LOW (ref 12.0–15.0)
MCH: 29.5 pg (ref 26.0–34.0)
MCHC: 32.3 g/dL (ref 30.0–36.0)
MCV: 91.4 fL (ref 80.0–100.0)
Platelets: 303 10*3/uL (ref 150–400)
RBC: 2.44 MIL/uL — ABNORMAL LOW (ref 3.87–5.11)
RDW: 15.4 % (ref 11.5–15.5)
WBC: 10.6 10*3/uL — ABNORMAL HIGH (ref 4.0–10.5)
nRBC: 0 % (ref 0.0–0.2)

## 2020-03-17 LAB — PROTEIN ELECTROPHORESIS, SERUM
A/G Ratio: 0.6 — ABNORMAL LOW (ref 0.7–1.7)
Albumin ELP: 2.3 g/dL — ABNORMAL LOW (ref 2.9–4.4)
Alpha-1-Globulin: 0.4 g/dL (ref 0.0–0.4)
Alpha-2-Globulin: 0.7 g/dL (ref 0.4–1.0)
Beta Globulin: 1.2 g/dL (ref 0.7–1.3)
Gamma Globulin: 1.8 g/dL (ref 0.4–1.8)
Globulin, Total: 4.1 g/dL — ABNORMAL HIGH (ref 2.2–3.9)
Total Protein ELP: 6.4 g/dL (ref 6.0–8.5)

## 2020-03-17 LAB — ANGIOTENSIN CONVERTING ENZYME: Angiotensin-Converting Enzyme: 52 U/L (ref 14–82)

## 2020-03-17 LAB — GLUCOSE, CAPILLARY
Glucose-Capillary: 77 mg/dL (ref 70–99)
Glucose-Capillary: 82 mg/dL (ref 70–99)

## 2020-03-17 LAB — SJOGRENS SYNDROME-A EXTRACTABLE NUCLEAR ANTIBODY: SSA (Ro) (ENA) Antibody, IgG: >8 AI — ABNORMAL HIGH (ref 0.0–0.9)

## 2020-03-17 LAB — RHEUMATOID FACTOR: Rheumatoid fact SerPl-aCnc: 25 IU/mL — ABNORMAL HIGH (ref <14.0)

## 2020-03-17 LAB — RPR: RPR Ser Ql: NONREACTIVE

## 2020-03-17 LAB — SJOGRENS SYNDROME-B EXTRACTABLE NUCLEAR ANTIBODY: SSB (La) (ENA) Antibody, IgG: 0.2 AI (ref 0.0–0.9)

## 2020-03-17 IMAGING — CT CT CHEST W/O CM
2 of 4 series · 15 of 36 positions shown, 18 images · non-contrast
Comparison: [DATE]

CLINICAL DATA: Sepsis, cough, post CPR

EXAM:
CT CHEST WITHOUT CONTRAST
TECHNIQUE: Multidetector CT imaging of the chest was performed following the
standard protocol without IV contrast.

[Series 3: chest wo · axial · 0.60mm/px · z∈[+1272,+1500]mm · 12 of 136 slices shown, 15 images]
[im 11/136  mediastinal]
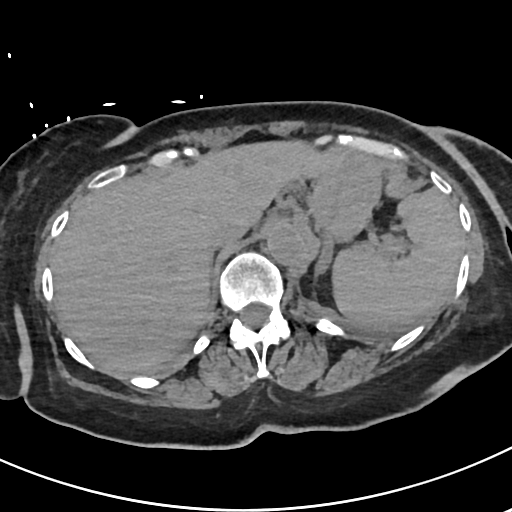
[im 11/136  lung]
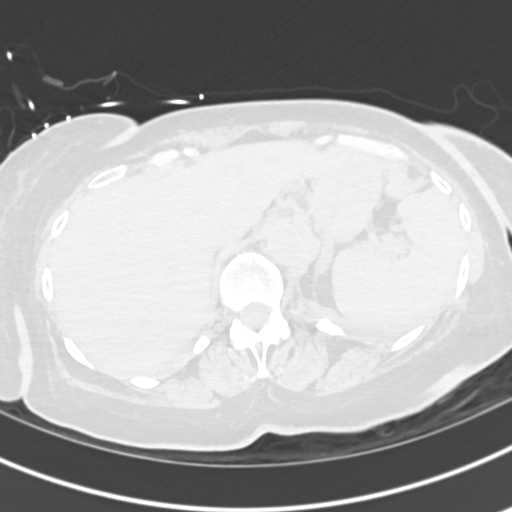
[im 21/136  lung]
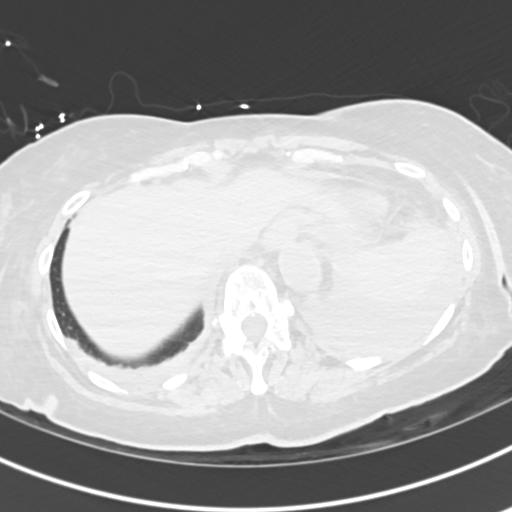
[im 32/136  lung]
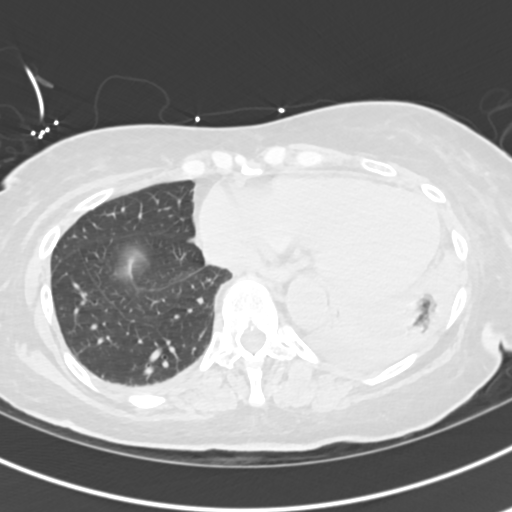
[im 42/136  lung]
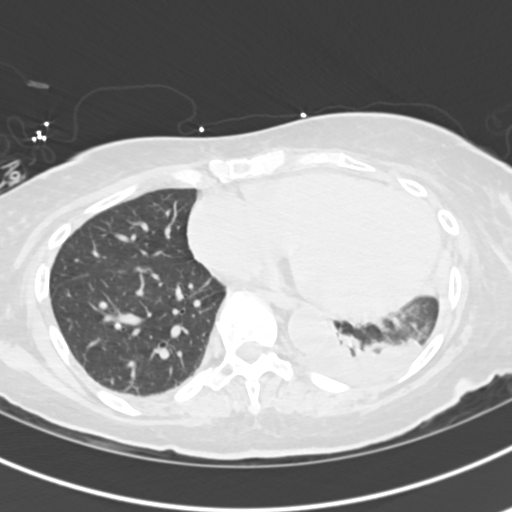
[im 52/136  mediastinal]
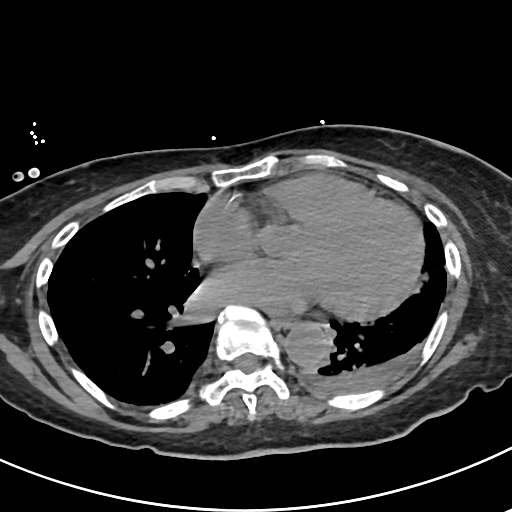
[im 52/136  lung]
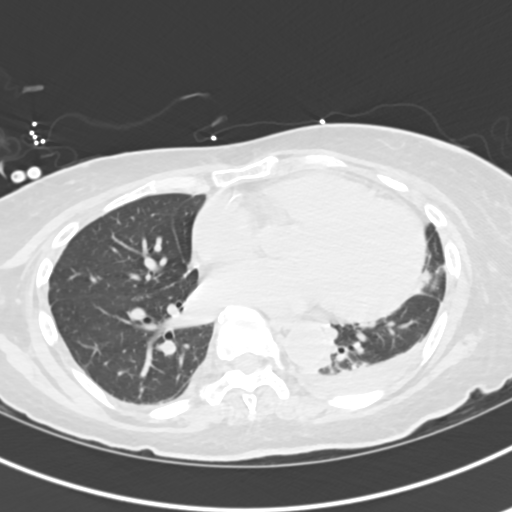
[im 63/136  lung]
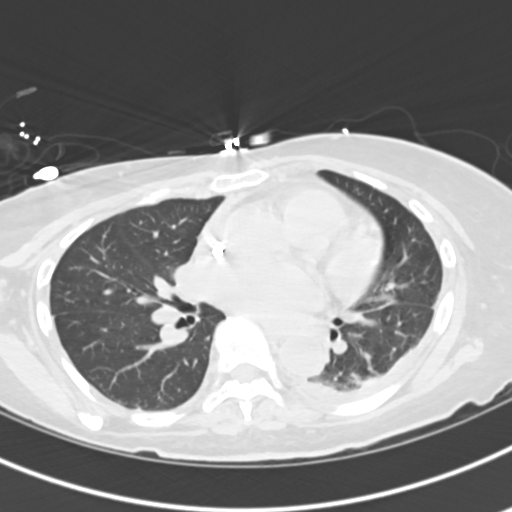
[im 73/136  lung]
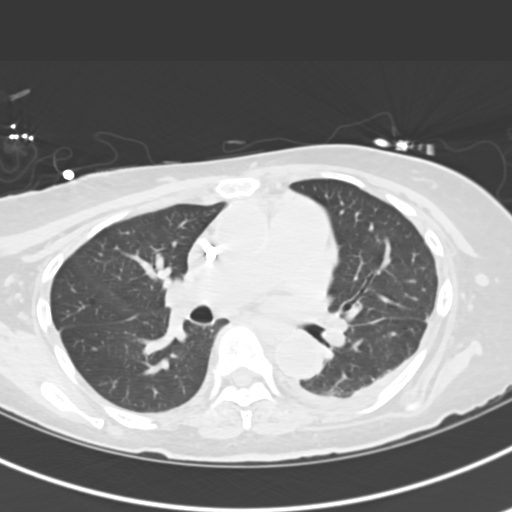
[im 84/136  lung]
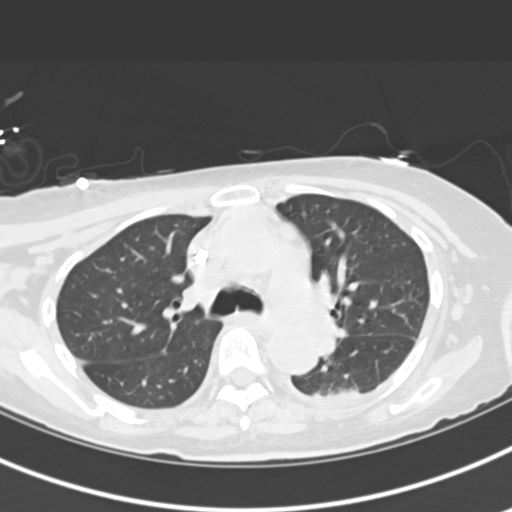
[im 94/136  mediastinal]
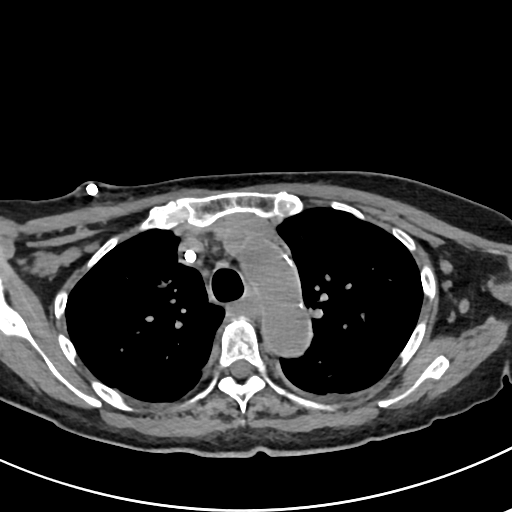
[im 94/136  lung]
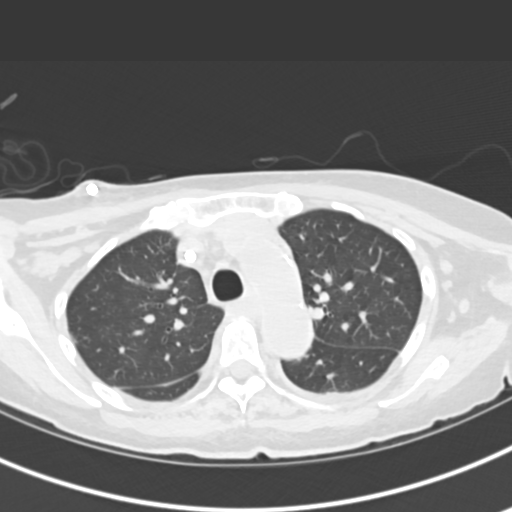
[im 104/136  lung]
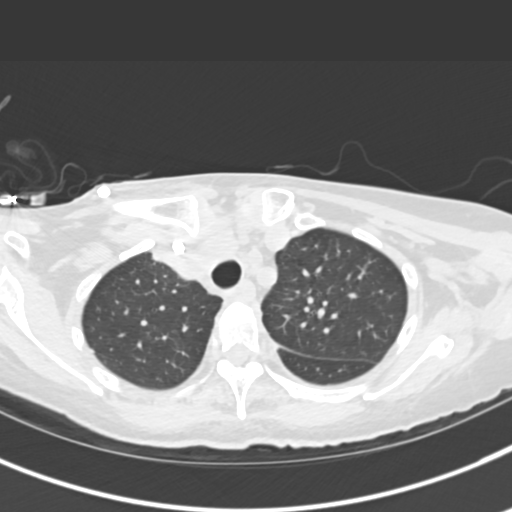
[im 115/136  lung]
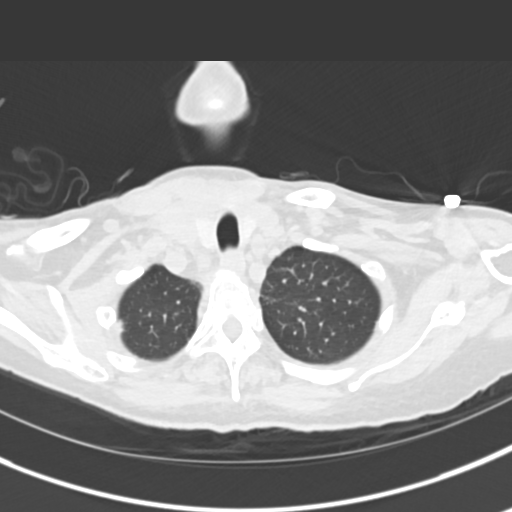
[im 125/136  lung]
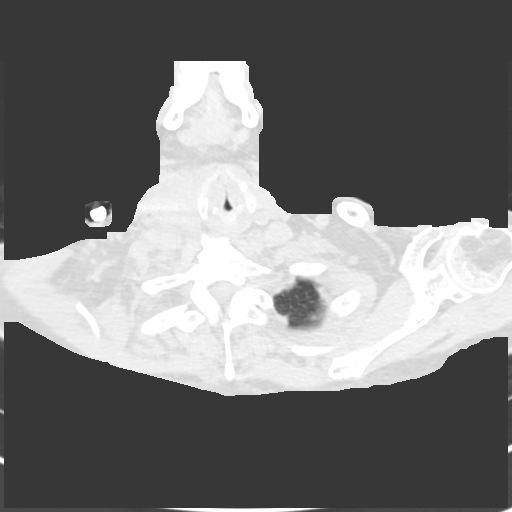

[Series 6: cor · coronal · 0.60mm/px · 3 of 108 slices shown]
[im 22/108  lung]
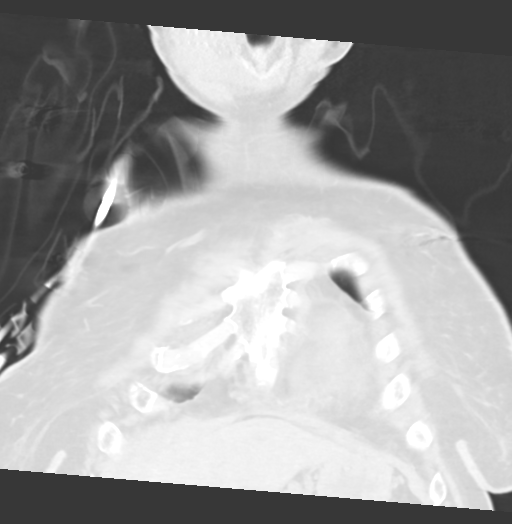
[im 43/108  lung]
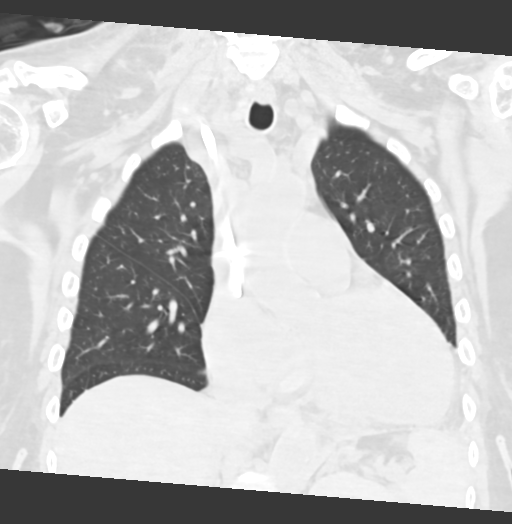
[im 65/108  lung]
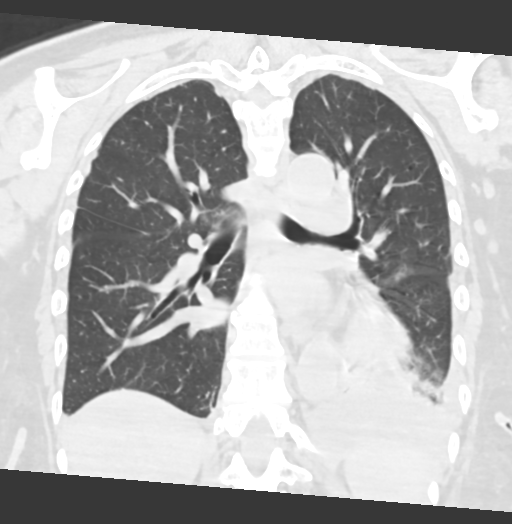

[15 of 36 positions shown; findings below may reference images not displayed]

FINDINGS: Cardiovascular: Cardiomegaly. Scattered aortic calcifications. No
aneurysm.

Mediastinum/Nodes: Small and borderline sized mediastinal lymph
nodes are stable since prior study. Bilateral axillary borderline
lymph nodes also stable since prior study. Trachea and esophagus are
unremarkable. Thyroid unremarkable.

Lungs/Pleura: Decreasing bilateral effusions with residual small
left effusion and trace right effusion. Compressive atelectasis
versus pneumonia in the left lower lobe.

Upper Abdomen: Imaging into the upper abdomen demonstrates no acute
findings.

Musculoskeletal: Chest wall soft tissues are unremarkable. No acute
bony abnormality.
IMPRESSION: Stable cardiomegaly.

Stable shoddy mediastinal and bilateral axillary lymph nodes.

Decreasing bilateral effusions with small residual left pleural
effusion and trace right pleural effusion.

Compressive atelectasis versus pneumonia in the left lower lobe.

Aortic Atherosclerosis ([BS]-[BS]).

## 2020-03-17 MED ORDER — PREDNISONE 20 MG PO TABS
40.0000 mg | ORAL_TABLET | Freq: Every day | ORAL | Status: AC
  Administered 2020-03-17 – 2020-03-21 (×5): 40 mg via ORAL
  Filled 2020-03-17 (×5): qty 2

## 2020-03-17 MED ORDER — HEPARIN SODIUM (PORCINE) 1000 UNIT/ML IJ SOLN
INTRAMUSCULAR | Status: AC
  Filled 2020-03-17: qty 4

## 2020-03-17 MED ORDER — DARBEPOETIN ALFA 60 MCG/0.3ML IJ SOSY
PREFILLED_SYRINGE | INTRAMUSCULAR | Status: AC
  Administered 2020-03-17: 60 ug via INTRAVENOUS
  Filled 2020-03-17: qty 0.3

## 2020-03-17 MED ORDER — HEPARIN SODIUM (PORCINE) 1000 UNIT/ML DIALYSIS: 40.0000 [IU]/kg | INTRAMUSCULAR | Status: DC | PRN

## 2020-03-17 MED ORDER — VANCOMYCIN HCL IN DEXTROSE 750-5 MG/150ML-% IV SOLN
INTRAVENOUS | Status: AC
  Administered 2020-03-17: 750 mg via INTRAVENOUS
  Filled 2020-03-17: qty 150

## 2020-03-17 NOTE — Progress Notes (Signed)
Physical Therapy Treatment Patient Details Name: Barbara Crawford MRN: [id number] DOB: [birth year] Today's Date: 03/17/2020    History of Present Illness 65 y.o. female with medical history significant of HTN. Pt had COVID in Dec, 1 week prior to presenting to ED 02/25/20 she developed swelling in BLE. Developed pain in B lower back; CT abd/pelvis - no obstruction or acute abnormality. HGB 6.3, +acute renal failure; 2/23 seizure and Code Blue with CPR x 4 minutes and intubated 2/23-2/25; CRRT; renal biopsy 2/28 with perinephric hematoma; pneumonia;  IJ catheter placed and HD initiated. Plans to have dialysis access placed 3/11    PT Comments    Patient received in recliner, NT reports patient just got washed up. Patient is agreeable to LE exercises at this time due to fatigue. She began exercises and then requested to go to bathroom due to bout of diahrrea. Patient assisted to bathroom, required supervision/min guard for sit to stand transfers. Ambulated with RW and supervision. Patient performed seated exercises as well. Patient will continue to benefit from skilled PT while here to improve strength, activity tolerance and safety with mobility.        Follow Up Recommendations  Home health PT;Supervision - Intermittent     Equipment Recommendations  Rolling walker with 5" wheels    Recommendations for Other Services       Precautions / Restrictions Precautions Precaution Comments: mod fall Restrictions Weight Bearing Restrictions: No    Mobility  Bed Mobility               General bed mobility comments: in recliner pre and post session    Transfers Overall transfer level: Needs assistance Equipment used: Rolling walker (2 wheeled) Transfers: Sit to/from Stand Sit to Stand: Supervision;Min guard         General transfer comment: requires supervision to stand from recliner, min guard from low toilet with grab bar on right.  Ambulation/Gait Ambulation/Gait  assistance: Supervision Gait Distance (Feet): 20 Feet Assistive device: Rolling walker (2 wheeled) Gait Pattern/deviations: Step-through pattern Gait velocity: WFL   General Gait Details: patient initially declined ambulation, but during session needed to go to bathroom so ambulated to bathroom and back.   Stairs             Wheelchair Mobility    Modified Rankin (Stroke Patients Only)       Balance Overall balance assessment: Modified Independent Sitting-balance support: Feet supported Sitting balance-Leahy Scale: Good     Standing balance support: Bilateral upper extremity supported;During functional activity Standing balance-Leahy Scale: Good Standing balance comment: patient ambulates with RW, able to stand for cleaning bottom, washing hands without UE support.                            Cognition Arousal/Alertness: Awake/alert Behavior During Therapy: WFL for tasks assessed/performed Overall Cognitive Status: Within Functional Limits for tasks assessed                                        Exercises Other Exercises Other Exercises: stood at sink performing ADL tasks without UE support but with wide BOS Other Exercises: BLE strengthening exercises : ap, hip abd/add, seated LAQ, marching, x 10-15 reps SLR x 5 reps    General Comments        Pertinent Vitals/Pain Pain Assessment: No/denies pain    Home  Living                      Prior Function            PT Goals (current goals can now be found in the care plan section) Acute Rehab PT Goals Patient Stated Goal: To return home. PT Goal Formulation: With patient Time For Goal Achievement: 03/24/20 Potential to Achieve Goals: Good Progress towards PT goals: Progressing toward goals    Frequency    Min 3X/week      PT Plan Current plan remains appropriate    Co-evaluation              AM-PAC PT "6 Clicks" Mobility   Outcome Measure  Help  needed turning from your back to your side while in a flat bed without using bedrails?: None Help needed moving from lying on your back to sitting on the side of a flat bed without using bedrails?: A Little Help needed moving to and from a bed to a chair (including a wheelchair)?: A Little Help needed standing up from a chair using your arms (e.g., wheelchair or bedside chair)?: A Little Help needed to walk in hospital room?: A Little Help needed climbing 3-5 steps with a railing? : A Little 6 Click Score: 19    End of Session   Activity Tolerance: Patient tolerated treatment well Patient left: in chair;with SCD's reapplied Nurse Communication: Mobility status PT Visit Diagnosis: Muscle weakness (generalized) (M62.81)     Time: [id number] PT Time Calculation (min) (ACUTE ONLY): 25 min  Charges:  $Gait Training: 8-22 mins $Therapeutic Exercise: 8-22 mins                     [employer], PT, [employer] 03/17/20,12:01 PM

## 2020-03-17 NOTE — Progress Notes (Addendum)
Pharmacy Antibiotic Note  Barbara Crawford is a 65 y.o. female to begin broader spectrum antibiotics for PNA.  New HD patient.  Pt was started on vanc/cefepime on 3/3 before they were dced. She has recurrent fever with CXR showing increase consolidation and effusion. Vanc and cefepime have been ordered to be restarted. She was MRSA PCR neg previously. AVF has been placed and she is now on TTS HD schedule. She did get one dose of vanc 3/11 for the AVF placement.   ID has been re-consulted to eval for persistent fevers. WBC has trended down to wnl. Cultures have remained neg to date.   Target pre-HD vanc: 15-25  Plan: Vancomycin '750mg'$  IV TTS Cefepime 2g IV TTS  Height: 5' 2.99" (160 cm) Weight: 68 kg (149 lb 14.6 oz) IBW/kg (Calculated) : 52.38  Temp (24hrs), Avg:101.3 F (38.5 C), Min:99.3 F (37.4 C), Max:103.1 F (39.5 C)  Recent Labs  Lab 03/11/20 0206 03/12/20 0019 03/13/20 1007 03/14/20 0133 03/15/20 0419 03/16/20 0248  WBC 9.2 10.1  --  10.3 10.1 8.4  CREATININE 6.59* 8.02* 6.00* 7.16* 5.37* 7.35*    Estimated Creatinine Clearance: 7.2 mL/min (A) (by C-G formula based on SCr of 7.35 mg/dL (H)).    Allergies  Allergen Reactions  . Erythromycin Other (See Comments)    Pt does not recall  . Iodine Other (See Comments)    Arm flares up per pt  . Penicillins     Has patient had a PCN reaction causing immediate rash, facial/tongue/throat swelling, SOB or lightheadedness with hypotension: no Has patient had a PCN reaction causing severe rash involving mucus membranes or skin necrosis: yes Has patient had a PCN reaction that required hospitalization: no Has patient had a PCN reaction occurring within the last 10 years: no If all of the above answers are "NO", then may proceed with Cephalosporin use.  Marland [employer] Shellfish-Derived Products    Ceftriaxone 2/26>>3/3 Vancomycin 3/3>3/3 3/12>> Cefepime 3/3>3/3 3/12>>  2/23 MRSA PCR neg 2/23 Bcx ngtdF 2/23 TA neg 2/23 Ucx  negF 3/11 blood>>ngtd 3/12 urine>> no growth - final  Onnie Boer, PharmD, BCIDP, AAHIVP, CPP Infectious Disease Pharmacist 03/17/2020 8:33 AM

## 2020-03-17 NOTE — Progress Notes (Signed)
PROGRESS NOTE    Barbara Crawford   SVX:[id number]  DOB: 03/04/1955  DOA: 02/25/2020     21  PCP: Vonna Drafts, FNP  Brief narrative/Hospital Course:  [name] is a 65 yo female with past medical history of hypertension, prior Covid (Dec 2021) presented to the hospital with complaints of lower extremity swelling, back pain, generalized malaise, fatigue at home. In the ED, she was found to have a hemoglobin of 6.3, potassium 5.4, sodium 130, bicarb 9, BUN 144, and creatinine 29.98.  CT of the abdomen and pelvis was negative for obstruction or acute abnormality.  Temporary hemodialysis catheter was placed by IR and subsequently, the patient had a PEA arrest associated with seizure-like activity.  She was intubated and subsequently extubated 02/28/2020.   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 Renal biopsy done 03/03/2019.  ANA positive.  ID followed the patient for fever.Trialysis cath was removed on 03/06/20 and subsequently underwent permacath placement.  At this time, patient has still has a spike of fever up to 103.1 F.  Extensive work-up was done in the past for fever.  Recent x-ray showed consolidation in the lungs.  Patient was started on vancomycin and cefepime weekly for possible healthcare history pneumonia.Marland Kitchen  Spoke with infectious disease and nephrology.  There is possibility of autoimmune disease causing kidney disease and fever.  Prednisone has been initiated today.  We will need to follow-up with nephrology and ID.  Likely need home health on discharge.  Assessment & Plan:  Principal Problem:   Acute renal failure (HCC) Active Problems:   Normocytic anemia   Uremia   HTN (hypertension)   Perinephric hematoma   Seizure (HCC)   Lobar pneumonia (HCC)   Sepsis (HCC) versus SIRS due to autoimmune process   Fever   Glomerulonephritis   ABLA (acute blood loss anemia)   Pleural effusion   Encounter for orogastric (OG) tube  placement   FSGS (focal segmental glomerulosclerosis)  Fever, cough, shortness of breath, dyspnea.  Shortness of breath and dyspnea has improved.  Still has fever with T-max of 103.1 F this morning.  Being empirically treated for possible healthcare history pneumonia. Status post extubation 02/28/2020.  Recent chest x-ray shows consolidation. On IV vancomycin and cefepime.  Recent blood cultures negative in 4 days.  Urine cultures no growth. Duplex ultrasound was negative for DVT.  Continue incentive spirometry.  CT chest on 03/06/2020 showed bilateral effusion and atelectasis.  Fever.  T-max of 103.1 F.  Possible autoimmune etiology.  Prednisone has been initiated by nephrology.  We will continue to monitor fever trend.  CT scan of the abdomen pelvis with evolving left perinephric hematoma.  As per ID recommendation  CT scan of the chest was performed which showed mediastinal and axillary lymph nodes with decreasing bilateral pleural effusion.  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  Developed a perinephric hematoma post biopsy.    CT scan of the abdomen pelvis from 03/16/2020 showed evolving left perinephric hematoma without acute hemorrhagic component.  Status post IR guided internal jugular tunneled hemodialysis catheter on 03/09/2020.  Awaiting for outpatient hemodialysis arrangement.  Pleural effusion Continue hemodialysis for volume management.  Patient does have hypoalbuminemia as well.  Moderate sized pleural effusion on CT on 3/4; no obvious HF on echo (EF 50-55%).  X-ray from 03/13/2020 showed increasing left basilar consolidation and effusion.  CT scan from 03/17/2020 showing decreasing bilateral  effusion basically small on the left and trace on the right  Acute blood loss anemia Stable at this time.  Continue to monitor hemoglobin.  Latest hemoglobin of 7.6  Acute glomerulonephritis Status post renal biopsy  which showed a focal segmental glomerulosclerosis.  Nephrology on board.  Prednisone has been initiated for possible autoimmune etiology.  Seizure  Patient had seizure with lossof pulse and PEA arrest s/p CPR.  Was seen by neurology.  Plan is to continue Keppra on discharge  Perinephric hematoma Seen on CT abdomen/pelvis on 03/03/2020.  Continue to monitor closely.  Latest hemoglobin of 7.6  CBC Latest Ref Rng & Units 03/16/2020 03/15/2020 03/14/2020  WBC 4.0 - 10.5 K/uL 8.4 10.1 10.3  Hemoglobin 12.0 - 15.0 g/dL 7.6(L) 8.1(L) 8.7(L)  Hematocrit 36.0 - 46.0 % 23.3(L) 24.2(L) 26.7(L)  Platelets 150 - 400 K/uL 276 268 269    Essential hypertension Continue with nifedipine, metoprolol and labetalol as needed.    Improved blood pressure on metoprolol 75 mg twice daily.  Uremia Resolved.  Likely secondary to to AKI.  Continue hemodialysis.  Normocytic anemia Status post kidney biopsy and perinephric hematoma.  Status post 2 units of packed RBC on 03/03/2020.  Continue Aranesp as per nephrology.  Recent hemoglobin of 7.6.  Acute on chronic respiratory failure with hypoxia  Resolved.  Status post PEA arrest.  Extubated on 02/28/2020.  Currently on room air  Cardiac arrest with pulseless electrical activity  Status post CPR and intubation.  Stable at this time.  Debility, deconditioning,  Will need home PT on discharge.  Antimicrobials: None at this time.  DVT prophylaxis: Sequential compression device due to recent hematoma     Code Status: Full Code    Family Communication:  None today.   Disposition Plan: Status is: Inpatient  Remains inpatient appropriate because:, IV treatments appropriate due to intensity of illness or inability to take PO and Inpatient level of care appropriate due to severity of illness, will need hemodialysis set up as outpatient, new fever  Dispo:  Patient From: Home  Planned Disposition: Home with Health Care Svc   Anticipated date of discharge.  2 to 3  days, continue to monitor  fever trend, await OP HD setup.  Medically stable for discharge: No  Subjective Today, patient was seen and examined at bedside.  Noted to have a spike of fever this morning.  Denies increasing shortness of breath but mild cough.  No chest pain dizziness lightheadedness nausea vomiting  Objective:  Vitals with BMI 03/17/2020 03/17/2020 03/16/2020  Height - - -  Weight - - -  BMI - - -  Systolic 622 297 989  Diastolic 70 90 84  Pulse 80 - 93   Body mass index is 26.56 kg/m.  Physical Examination: General:  Average built, not in obvious distress, alert awake oriented HENT:   No scleral pallor or icterus noted. Oral mucosa is moist.  Right internal jugular permacath in place Chest: Diminished breath sounds bilaterally.  CVS: S1 &S2 heard. No murmur.  Regular rate and rhythm. Abdomen: Soft, nontender, nondistended.  Bowel sounds are heard.   Extremities: No cyanosis, clubbing with bilateral lower extremity edema.  Peripheral pulses are palpable.  Left upper extremity brachiocephalic fistula Psych: Alert, awake and oriented, normal mood CNS:  No cranial nerve deficits.  Power equal in all extremities.   Skin: Warm and dry.  No rashes noted.  Consultants:   ID  Nephrology  Procedures:    Hemodialysis   internal jugular  tunneled hemodialysis catheter on 03/09/2020.  Left brachiocephalic fistula placement on 03/13/2020  Data Reviewed: I have personally reviewed following labs and imaging studies  Results for orders placed or performed during the hospital encounter of 02/25/20 (from the past 24 hour(s))  Glucose, capillary     Status: None   Collection Time: 03/16/20  3:05 PM  Result Value Ref Range   Glucose-Capillary 90 70 - 99 mg/dL  Glucose, capillary     Status: None   Collection Time: 03/16/20  9:52 PM  Result Value Ref Range   Glucose-Capillary 97 70 - 99 mg/dL  Glucose, capillary     Status: None   Collection Time: 03/17/20  7:27 AM  Result  Value Ref Range   Glucose-Capillary 77 70 - 99 mg/dL  Glucose, capillary     Status: None   Collection Time: 03/17/20 11:28 AM  Result Value Ref Range   Glucose-Capillary 82 70 - 99 mg/dL    Recent Results (from the past 240 hour(s))  Culture, blood (routine x 2)     Status: None (Preliminary result)   Collection Time: 03/13/20  9:35 PM   Specimen: BLOOD  Result Value Ref Range Status   Specimen Description BLOOD RIGHT ARM  Final   Special Requests   Final    BOTTLES DRAWN AEROBIC AND ANAEROBIC Blood Culture adequate volume   Culture   Final    NO GROWTH 4 DAYS Performed at [facility], [address], [place], [place] [address]    Report Status PENDING  Incomplete  Culture, blood (routine x 2)     Status: None (Preliminary result)   Collection Time: 03/13/20  9:41 PM   Specimen: BLOOD  Result Value Ref Range Status   Specimen Description BLOOD RIGHT HAND  Final   Special Requests   Final    BOTTLES DRAWN AEROBIC AND ANAEROBIC Blood Culture adequate volume   Culture   Final    NO GROWTH 4 DAYS Performed at [facility], [place] [address] [place], [place] [address]    Report Status PENDING  Incomplete  Culture, Urine     Status: None   Collection Time: 03/14/20  3:00 AM   Specimen: Urine, Random  Result Value Ref Range Status   Specimen Description URINE, RANDOM  Final   Special Requests NONE  Final   Culture   Final    NO GROWTH Performed at [facility], [place] [address] [place], [place] [address]    Report Status 03/15/2020 FINAL  Final     Radiology Studies: CT CHEST WO CONTRAST  Result Date: 03/17/2020 CLINICAL DATA:  Sepsis, cough, post CPR EXAM: CT CHEST WITHOUT CONTRAST TECHNIQUE: Multidetector CT imaging of the chest was performed following the standard protocol without IV contrast. COMPARISON:  03/06/2020 FINDINGS: Cardiovascular: Cardiomegaly. Scattered aortic calcifications. No aneurysm. Mediastinum/Nodes: Small and borderline sized  mediastinal lymph nodes are stable since prior study. Bilateral axillary borderline lymph nodes also stable since prior study. Trachea and esophagus are unremarkable. Thyroid unremarkable. Lungs/Pleura: Decreasing bilateral effusions with residual small left effusion and trace right effusion. Compressive atelectasis versus pneumonia in the left lower lobe. Upper Abdomen: Imaging into the upper abdomen demonstrates no acute findings. Musculoskeletal: Chest wall soft tissues are unremarkable. No acute bony abnormality. IMPRESSION: Stable cardiomegaly. Stable shoddy mediastinal and bilateral axillary lymph nodes. Decreasing bilateral effusions with small residual left pleural effusion and trace right pleural effusion. Compressive atelectasis versus pneumonia in the left lower lobe. Aortic Atherosclerosis (  ICD10-I70.0). Electronically Signed   By: Rolm Baptise M.D.   On: 03/17/2020 09:56   CT ABDOMEN PELVIS W CONTRAST  Result Date: 03/16/2020 CLINICAL DATA:  Abdominal pain and fever. EXAM: CT ABDOMEN AND PELVIS WITH CONTRAST TECHNIQUE: Multidetector CT imaging of the abdomen and pelvis was performed using the standard protocol following bolus administration of intravenous contrast. CONTRAST:  62m OMNIPAQUE IOHEXOL 300 MG/ML  SOLN COMPARISON:  March 03, 2020 FINDINGS: Lower chest: Mild to moderate severity atelectasis is seen within the left lung base. Mild atelectasis is also noted along the posterior aspect of the right lung base. These areas are decreased in severity when compared to the prior study. There is a small left pleural effusion. This is mildly decreased in size. Hepatobiliary: No focal liver abnormality is seen. Status post cholecystectomy. No biliary dilatation. Pancreas: Unremarkable. No pancreatic ductal dilatation or surrounding inflammatory changes. Spleen: Normal in size without focal abnormality. Adrenals/Urinary Tract: Adrenal glands are unremarkable. The right kidney is normal in size, without  obstructing renal calculi, focal lesion, or hydronephrosis. A 7 mm nonobstructing renal stone is seen within the right kidney. An evolving left perinephric hematoma is seen. This measures approximately 1.8 cm in maximum thickness. No acute hemorrhagic component is seen. A mild amount of adjacent inflammatory fat stranding is present. The urinary bladder is partially contracted and subsequently limited in evaluation. Stomach/Bowel: Stomach is within normal limits. The appendix is not identified. No evidence of bowel wall thickening, distention, or inflammatory changes. Vascular/Lymphatic: No significant vascular findings are present. No enlarged abdominal or pelvic lymph nodes. Reproductive: Multiple parenchymal calcifications are seen within the lobulated uterus. The bilateral adnexa are unremarkable. Other: No abdominal wall hernia or abnormality. No abdominopelvic ascites. Musculoskeletal: Degenerative changes are seen within the lumbar spine. IMPRESSION: 1. Evolving left perinephric hematoma, without evidence of acute hemorrhagic component. 2. 7 mm nonobstructing renal stone within the right kidney. 3. Bibasilar atelectasis, left greater than right. 4. Small left pleural effusion. 5. Evidence of prior cholecystectomy. Electronically Signed   By: TVirgina NorfolkM.D.   On: 03/16/2020 19:44   CT CHEST WO CONTRAST  Final Result    CT ABDOMEN PELVIS W CONTRAST  Final Result    DG Chest Port 1 View  Final Result    VAS [place]UPPER EXT VEIN MAPPING (PRE-OP AVF)  Final Result    IR Fluoro Guide CV Line Right  Final Result    IR [place] Vasc Access Right  Final Result    VAS [place]OWER EXTREMITY VENOUS (DVT)  Final Result    VAS [place]PPER EXTREMITY VENOUS DUPLEX  Final Result    CT CHEST WO CONTRAST  Final Result    CT ABDOMEN PELVIS WO CONTRAST  Final Result    [place]oreaBIOPSY (KIDNEY)  Final Result    DG Chest 2 View  Final Result    DG CHEST PORT 1 VIEW  Final Result    MR BRAIN WO  CONTRAST  Final Result    MR ANGIO HEAD WO CONTRAST  Final Result    CT HEAD WO CONTRAST  Final Result    DG Abd 1 View  Final Result    DG CHEST PORT 1 VIEW  Final Result    DG Chest Port 1 View  Final Result    IR Fluoro Guide CV Line Right  Final Result    IR [place] Vasc Access Right  Final Result    [place]NAL  Final Result  CT Renal Stone Study  Final Result      Scheduled Meds:  chlorhexidine gluconate (MEDLINE KIT)  15 mL Mouth Rinse BID   Chlorhexidine Gluconate Cloth  6 each Topical Q0600   darbepoetin (ARANESP) injection - DIALYSIS  60 mcg Intravenous Q Tue-HD   dextromethorphan  30 mg Oral BID   docusate sodium  100 mg Oral BID   feeding supplement  237 mL Oral TID BM   levETIRAcetam  250 mg Oral Q T,Th,Sa-HD   levETIRAcetam  500 mg Oral BID   mouth rinse  15 mL Mouth Rinse BID   metoprolol tartrate  75 mg Oral BID   multivitamin  1 tablet Oral QHS   NIFEdipine  90 mg Oral Daily   predniSONE  40 mg Oral Q breakfast   PRN Meds: acetaminophen, guaiFENesin, labetalol, LORazepam, melatonin, menthol-cetylpyridinium, ondansetron **OR** ondansetron (ZOFRAN) IV, oxyCODONE Continuous Infusions:  sodium chloride 10 mL/hr at 03/14/20 1441   ceFEPime (MAXIPIME) IV 2 g (03/14/20 1443)   vancomycin       LOS: 21 days    Flora Lipps, MD [facility] 03/17/2020, 2:56 PM

## 2020-03-17 NOTE — Progress Notes (Addendum)
Renal Navigator spoke with [facility]missions Coordinator/K. Atkins to request update on referral sent last week. Navigator provided SS# and permanent access VVS note, per [facility] request. She states she has received other documents sent by Navigator and is in the process of going through records. Navigator states that patient is ready and eager for discharge. Navigator requests update as soon as possible. Navigator also sent message to [facility] Manager to alert her to patient's need for outpatient HD seat need.  Navigator will follow closely.  Alphonzo Cruise, [place] Renal Navigator [phone]

## 2020-03-17 NOTE — Progress Notes (Signed)
[facility] Admissions Coordinator has asked for additional records. Records faxed.  Navigator will continue to follow closely.   Alphonzo Cruise, [place] Renal Navigator [phone]

## 2020-03-17 NOTE — Progress Notes (Signed)
Renal Navigator has received final decision from [facility]missions that patient will not be accepted for outpatient at this time given her current insurance plan/[employer]. Her referral has been canceled. [facility] willing to review again in the future if her insurance coverage changes.  Navigator has received message back from [place], who updated Renal Navigator that only one MD at [facility] accepts [facility]. Navigator left message back to ask if there are any additional MDs at [facility] [place] to see if this would increase patient's chances of getting an outpatient HD seat in [facility]. If patient cannot be accepted in [facility], [facility] will be her last option at this time with this insurance. Navigator will attempt again to follow up with patient tomorrow.   Alphonzo Cruise, [place] Renal Navigator [phone]

## 2020-03-17 NOTE — Progress Notes (Signed)
Patient ID: Barbara Crawford, female   DOB: 05-02-1955, 65 y.o.   MRN: [id number] [facility] Progress Note   Assessment/ Plan:   1.  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  Continue dialysis on TTS schedule with process underway for outpatient dialysis unit placement-okay to postpone dialysis until tomorrow per patient request.  She had creation of a left brachiocephalic fistula by Dr. Carlis Abbott on 3/11 and is currently getting hemodialysis via a right IJ tunneled hemodialysis catheter. 2.  Suspected healthcare associated pneumonia: On intravenous cefepime and vancomycin and remains intermittently febrile.  She has fever of unknown origin that has been worked up with imaging/cultures.  We will start her on prednisone suspecting an autoimmune mechanism with positive ANA (plausible that this is a false positive) and renal biopsy findings. 3.  Anemia: Likely from underlying chronic disease and surgical losses.  Will increase dose of Aranesp. 4.  Hypertension: Blood pressure under decent control, continue to monitor on current medication/hemodialysis. 5.  Secondary hyperparathyroidism: Calcium and phosphorus under acceptable control, not on binders at this time.  PTH level was 193 on 2/23-at goal for ESRD. 6.  Hyponatremia: We will begin fluid restriction of 1.2 L/day in the setting of end-stage renal disease-I discussed this with the patient.  Subjective:   Had more fevers overnight and "feels too tired to go to dialysis today".   Objective:   BP (!) 160/90 (BP Location: Right Arm)    Pulse 93    Temp 99.7 F (37.6 C) (Oral)    Resp 18    Ht 5' 2.99" (1.6 m)    Wt 68 kg    SpO2 90%    BMI 26.56 kg/m   Intake/Output Summary (Last 24 hours) at 03/17/2020 0830 Last data filed at 03/16/2020 1842 Gross per 24 hour  Intake --   Output 1050 ml  Net -1050 ml   Weight change:   Physical Exam: Gen: Comfortably sitting up in recliner CVS: Pulse regular tachycardia, S1 and S2 normal Resp: Clear to auscultation bilaterally.  Right IJ TDC in place. Abd: Soft, flat, nontender, bowel sounds normal Ext: 1-2+ bilateral lower extremity pitting edema.  Palpable thrill left BCF.  Imaging: CT ABDOMEN PELVIS W CONTRAST  Result Date: 03/16/2020 CLINICAL DATA:  Abdominal pain and fever. EXAM: CT ABDOMEN AND PELVIS WITH CONTRAST TECHNIQUE: Multidetector CT imaging of the abdomen and pelvis was performed using the standard protocol following bolus administration of intravenous contrast. CONTRAST:  1m OMNIPAQUE IOHEXOL 300 MG/ML  SOLN COMPARISON:  March 03, 2020 FINDINGS: Lower chest: Mild to moderate severity atelectasis is seen within the left lung base. Mild atelectasis is also noted along the posterior aspect of the right lung base. These areas are decreased in severity when compared to the prior study. There is a small left pleural effusion. This is mildly decreased in size. Hepatobiliary: No focal liver abnormality is seen. Status post cholecystectomy. No biliary dilatation. Pancreas: Unremarkable. No pancreatic ductal dilatation or surrounding inflammatory changes. Spleen: Normal in size without focal abnormality. Adrenals/Urinary Tract: Adrenal glands are unremarkable. The right kidney is normal in size, without obstructing renal calculi, focal lesion, or hydronephrosis. A 7 mm nonobstructing renal stone is seen within the right kidney. An evolving left perinephric hematoma is seen. This measures approximately 1.8 cm in maximum thickness. No acute hemorrhagic component is seen. A mild amount of adjacent inflammatory fat  stranding is present. The urinary bladder is partially contracted and subsequently limited in evaluation. Stomach/Bowel: Stomach is within normal limits. The appendix is not identified. No evidence of bowel wall  thickening, distention, or inflammatory changes. Vascular/Lymphatic: No significant vascular findings are present. No enlarged abdominal or pelvic lymph nodes. Reproductive: Multiple parenchymal calcifications are seen within the lobulated uterus. The bilateral adnexa are unremarkable. Other: No abdominal wall hernia or abnormality. No abdominopelvic ascites. Musculoskeletal: Degenerative changes are seen within the lumbar spine. IMPRESSION: 1. Evolving left perinephric hematoma, without evidence of acute hemorrhagic component. 2. 7 mm nonobstructing renal stone within the right kidney. 3. Bibasilar atelectasis, left greater than right. 4. Small left pleural effusion. 5. Evidence of prior cholecystectomy. Electronically Signed   By: Virgina Norfolk M.D.   On: 03/16/2020 19:44    Labs: BMET Recent Labs  Lab 03/11/20 0206 03/12/20 0019 03/13/20 1007 03/14/20 0133 03/15/20 0419 03/16/20 0248  NA 133* 132* 134* 132* 133* 133*  K 3.5 3.6 3.6 3.9 3.8 3.7  CL 99 97* 97* 96* 98 97*  CO2 23 22  --  24 25 24   GLUCOSE 93 104* 92 90 81 83  BUN 32* 42* 24* 30* 19 30*  CREATININE 6.59* 8.02* 6.00* 7.16* 5.37* 7.35*  CALCIUM 7.7* 7.9*  --  7.7* 7.8* 7.8*  PHOS 3.8 4.2  --   --  4.0  --    CBC Recent Labs  Lab 03/11/20 0206 03/12/20 0019 03/13/20 1007 03/14/20 0133 03/15/20 0419 03/16/20 0248  WBC 9.2 10.1  --  10.3 10.1 8.4  NEUTROABS 6.7  --   --   --   --   --   HGB 8.4* 9.2* 8.8* 8.7* 8.1* 7.6*  HCT 24.5* 28.2* 26.0* 26.7* 24.2* 23.3*  MCV 88.1 90.1  --  90.2 89.3 90.7  PLT 232 298  --  269 268 276    Medications:     chlorhexidine gluconate (MEDLINE KIT)  15 mL Mouth Rinse BID   Chlorhexidine Gluconate Cloth  6 each Topical Q0600   darbepoetin (ARANESP) injection - DIALYSIS  60 mcg Intravenous Q Tue-HD   dextromethorphan  30 mg Oral BID   docusate sodium  100 mg Oral BID   feeding supplement  237 mL Oral TID BM   levETIRAcetam  250 mg Oral Q T,Th,Sa-HD   levETIRAcetam   500 mg Oral BID   mouth rinse  15 mL Mouth Rinse BID   metoprolol tartrate  75 mg Oral BID   multivitamin  1 tablet Oral QHS   NIFEdipine  90 mg Oral Daily    Elmarie Shiley, MD 03/17/2020, 8:30 AM

## 2020-03-18 DIAGNOSIS — R8271 Bacteriuria: Secondary | ICD-10-CM | POA: Diagnosis not present

## 2020-03-18 DIAGNOSIS — D62 Acute posthemorrhagic anemia: Secondary | ICD-10-CM | POA: Diagnosis not present

## 2020-03-18 DIAGNOSIS — J9621 Acute and chronic respiratory failure with hypoxia: Secondary | ICD-10-CM | POA: Diagnosis not present

## 2020-03-18 DIAGNOSIS — N179 Acute kidney failure, unspecified: Secondary | ICD-10-CM | POA: Diagnosis not present

## 2020-03-18 LAB — CULTURE, BLOOD (ROUTINE X 2)
Culture: NO GROWTH
Culture: NO GROWTH
Special Requests: ADEQUATE
Special Requests: ADEQUATE

## 2020-03-18 LAB — CBC
HCT: 22.3 % — ABNORMAL LOW (ref 36.0–46.0)
Hemoglobin: 7.3 g/dL — ABNORMAL LOW (ref 12.0–15.0)
MCH: 29.3 pg (ref 26.0–34.0)
MCHC: 32.7 g/dL (ref 30.0–36.0)
MCV: 89.6 fL (ref 80.0–100.0)
Platelets: 285 10*3/uL (ref 150–400)
RBC: 2.49 MIL/uL — ABNORMAL LOW (ref 3.87–5.11)
RDW: 15.3 % (ref 11.5–15.5)
WBC: 7.6 10*3/uL (ref 4.0–10.5)
nRBC: 0 % (ref 0.0–0.2)

## 2020-03-18 LAB — GLUCOSE, CAPILLARY
Glucose-Capillary: 86 mg/dL (ref 70–99)
Glucose-Capillary: 113 mg/dL — ABNORMAL HIGH (ref 70–99)
Glucose-Capillary: 117 mg/dL — ABNORMAL HIGH (ref 70–99)
Glucose-Capillary: 110 mg/dL — ABNORMAL HIGH (ref 70–99)

## 2020-03-18 LAB — MPO/PR-3 (ANCA) ANTIBODIES
ANCA Proteinase 3: <3.5 U/mL (ref 0.0–3.5)
Myeloperoxidase Abs: <9 U/mL (ref 0.0–9.0)

## 2020-03-18 LAB — ANTINUCLEAR ANTIBODIES, IFA: ANA Ab, IFA: NEGATIVE

## 2020-03-18 LAB — COMPREHENSIVE METABOLIC PANEL
ALT: 29 U/L (ref 0–44)
AST: 42 U/L — ABNORMAL HIGH (ref 15–41)
Albumin: 2 g/dL — ABNORMAL LOW (ref 3.5–5.0)
Alkaline Phosphatase: 42 U/L (ref 38–126)
Anion gap: 12 (ref 5–15)
BUN: 22 mg/dL (ref 8–23)
CO2: 23 mmol/L (ref 22–32)
Calcium: 8.2 mg/dL — ABNORMAL LOW (ref 8.9–10.3)
Chloride: 101 mmol/L (ref 98–111)
Creatinine, Ser: 5.84 mg/dL — ABNORMAL HIGH (ref 0.44–1.00)
GFR, Estimated: 8 mL/min — ABNORMAL LOW (ref >60)
Glucose, Bld: 92 mg/dL (ref 70–99)
Potassium: 3.9 mmol/L (ref 3.5–5.1)
Sodium: 136 mmol/L (ref 135–145)
Total Bilirubin: 0.7 mg/dL (ref 0.3–1.2)
Total Protein: 6.6 g/dL (ref 6.5–8.1)

## 2020-03-18 LAB — MAGNESIUM: Magnesium: 1.9 mg/dL (ref 1.7–2.4)

## 2020-03-18 LAB — PHOSPHORUS: Phosphorus: 3.8 mg/dL (ref 2.5–4.6)

## 2020-03-18 MED ORDER — SODIUM CHLORIDE 0.9 % IV SOLN
2.0000 g | Freq: Once | INTRAVENOUS | Status: AC
  Administered 2020-03-18: 2 g via INTRAVENOUS
  Filled 2020-03-18: qty 2

## 2020-03-18 NOTE — Progress Notes (Signed)
Renal Navigator met with patient to offer support and update. She is understandable discouraged with her situation and Navigator apologized for how long her outpatient seat arrangement is taking. She replied, "it's not your fault. It's my insurance." This is true, but Navigator still feels badly for the position she is in and is trying all angles to get patient set up for outpatient HD so she can be discharged.  Navigator explained to patient that [facility] is NOT an option at this time, but is in the future is she has a Scientist, physiological (including Medicaid and or Medicare). Navigator asked if a Museum/gallery curator was able to speak with her here. She said yes, but does not know the outcome. She states Eugenio Hoes [phone] is the Museum/gallery curator and gives Navigator permission to call her. Navigator told patient that a referral to [facility] ([facility] or [place]) was made Friday morning 3/11, but still no word on whether she has been accepted. The latest communication from the Admissions Coordinator there is that only one MD at this clinic ([facility]) accepts [facility]. She states such disappointment that she switched from [facility].  Navigator asked patient if she wants to consider [place], as Navigator has had success in getting a patient with [place] accepted to these clinics, the closest being in [place], even though she has not gotten a "no" from [employer]. Health Systems has been historically slow in response time in Navigator's experience. Patient states she needs transportation, which will be much easier if we keep her in [facility]. For now, Navigator will continue to pursue placement at [facility] and has contacted [facility] to request update on referral to [facility].  Navigator will continue to follow.  Alphonzo Cruise, [place] Renal Navigator [phone]

## 2020-03-18 NOTE — Progress Notes (Signed)
Renal Navigator received message from [place] that [facility] accepting Nephrologist/Dr. Igwemezie's office has received patient's medical records and he "is aware and will review as soon as he is in the office."  Renal Navigator met with patient again to update. She states she called her Scientist, clinical (histocompatibility and immunogenetics) and stated she is dissatisfied with [place] due to being unable to get outpatient HD in [place] and the amount of time it is taking to get an answer from [facility]. She reports to Navigator that he states he will follow up with [facility] and will change her back to [facility] if necessary. Navigator asked patient to make sure she asks when her [facility] will be active if this is her plan.  Navigator has left message for [employer] to inquire about Disability application and whether patient currently will qualify for Medicaid/Medicare.  Patient remains very appreciative and understanding even in a frustrating situation.  Alphonzo Cruise, [place] Renal Navigator [phone]

## 2020-03-18 NOTE — Progress Notes (Signed)
PROGRESS NOTE    Barbara Crawford   KZL:[id number]  DOB: [birth year]  DOA: 02/25/2020     22  PCP: Vonna Drafts, FNP  Brief narrative/Hospital Course:  [name] is a 65 yo female with past medical history of hypertension, prior Covid (Dec 2021) presented to the hospital with complaints of lower extremity swelling, back pain, generalized malaise, fatigue at home. In the ED, she was found to have a hemoglobin of 6.3, potassium 5.4, sodium 130, bicarb 9, BUN 144, and creatinine 29.98.  CT of the abdomen and pelvis was negative for obstruction or acute abnormality.  Temporary hemodialysis catheter was placed by IR and subsequently, the patient had a PEA arrest associated with seizure-like activity.  She was intubated and subsequently extubated 02/28/2020.   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 Renal biopsy done 03/03/2019.  ANA positive.  ID followed the patient for fever.Trialysis cath was removed on 03/06/20 and subsequently underwent permacath placement.  At this time, patient has still has a spike of fever up to 101 F.  Extensive work-up was done in the past for fever.  Recent x-ray showed consolidation in the lungs.  Patient was started on vancomycin and cefepime weekly for possible healthcare history pneumonia.Marland Kitchen  Spoke with infectious disease and nephrology.  There is possibility of autoimmune disease causing kidney disease and fever.  Prednisone has been initiated..  We will need to follow-up with nephrology and ID.  Likely need home health on discharge.  Assessment & Plan:  Principal Problem:   Acute renal failure (HCC) Active Problems:   Normocytic anemia   Uremia   HTN (hypertension)   Perinephric hematoma   Seizure (HCC)   Lobar pneumonia (HCC)   Sepsis (HCC) versus SIRS due to autoimmune process   Fever   Glomerulonephritis   ABLA (acute blood loss anemia)   Pleural effusion   Encounter for orogastric (OG) tube placement    FSGS (focal segmental glomerulosclerosis)  Cough, shortness of breath, dyspnea.  Improving.  Shortness of breath and dyspnea has improved.  Still has fever with T-max of 101 F this morning.  Being empirically treated for possible healthcare history pneumonia.  End date of antibiotic 03/20/2020. On IV vancomycin and cefepime.  Recent blood cultures negative so far.  Urine cultures no growth. Duplex ultrasound was negative for DVT.  Continue incentive spirometry.  CT chest on 03/06/2020 showed bilateral effusion and atelectasis.  Fever.  T-max of 101.1 F.  Possible autoimmune etiology.  Continue prednisone.  We will continue to monitor fever trend.  CT scan of the abdomen pelvis with evolving left perinephric hematoma.  CT scan of the chest was performed which showed mediastinal and axillary lymph nodes with decreasing bilateral pleural effusion.  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  Developed a perinephric hematoma post biopsy.    CT scan of the abdomen pelvis from 03/16/2020 showed evolving left perinephric hematoma without acute hemorrhagic component.  Status post IR guided internal jugular tunneled hemodialysis catheter on 03/09/2020.  Awaiting for outpatient hemodialysis arrangement.  Pleural effusion Continue hemodialysis for volume management.  Patient does have hypoalbuminemia as well.  CT scan from 03/17/2020 showing decreasing bilateral effusion basically small on the left and trace on the right  Acute blood loss anemia Stable at this time.  Continue to monitor hemoglobin.  Latest hemoglobin of 7.6  Acute glomerulonephritis Status post renal biopsy which showed a focal  segmental glomerulosclerosis.  Nephrology on board.  Has been started on prednisone.  Seizure  Patient had seizure with lossof pulse and PEA arrest s/p CPR.  Was seen by neurology.  Plan is to continue Keppra on discharge  Perinephric  hematoma Seen on CT abdomen/pelvis on 03/03/2020.  Continue to monitor closely.  Latest hemoglobin of 7.3  CBC Latest Ref Rng & Units 03/18/2020 03/17/2020 03/16/2020  WBC 4.0 - 10.5 K/uL 7.6 10.6(H) 8.4  Hemoglobin 12.0 - 15.0 g/dL 7.3(L) 7.2(L) 7.6(L)  Hematocrit 36.0 - 46.0 % 22.3(L) 22.3(L) 23.3(L)  Platelets 150 - 400 K/uL 285 303 276    Essential hypertension Continue with nifedipine, metoprolol and labetalol as needed.    Improved blood pressure on metoprolol 75 mg twice daily.  Uremia Resolved.  Likely secondary to to AKI.  Continue hemodialysis.  Normocytic anemia Status post kidney biopsy and perinephric hematoma.  Status post 2 units of packed RBC on 03/03/2020.  Continue Aranesp as per nephrology.  Recent hemoglobin of 7.3.  Acute on chronic respiratory failure with hypoxia  Resolved.  Status post PEA arrest.  Extubated on 02/28/2020.  Currently on room air  Cardiac arrest with pulseless electrical activity  Status post CPR and intubation.  Stable at this time.  Debility, deconditioning,  Will need home PT on discharge.  Antimicrobials: None at this time.  DVT prophylaxis: Sequential compression device due to recent hematoma     Code Status: Full Code    Family Communication:  None today.   Disposition Plan: Status is: Inpatient  Remains inpatient appropriate because:, IV treatments appropriate due to intensity of illness or inability to take PO and Inpatient level of care appropriate due to severity of illness, will need hemodialysis set up as outpatient, new fever on prednisone  Dispo:  Patient From: Home  Planned Disposition: Home with Health Care Svc   Anticipated date of discharge.  2 to 3 days, continue to monitor  fever trend, await OP HD setup.  Medically stable for discharge: No  Subjective Today, patient was seen and examined at bedside.  Denies any increasing shortness of breath chest pain palpitation but had temperature of 101 F. Objective:  Vitals  with BMI 03/18/2020 03/17/2020 03/17/2020  Height - - -  Weight - - -  BMI - - -  Systolic 161 096 045  Diastolic 66 68 68  Pulse 81 109 98   Body mass index is 25.63 kg/m.  Physical Examination: General:  Average built, not in obvious distress, alert awake oriented HENT:   No scleral pallor or icterus noted. Oral mucosa is moist.  Right internal jugular permacath in place Chest: Diminished breath sounds bilaterally.  CVS: S1 &S2 heard. No murmur.  Regular rate and rhythm. Abdomen: Soft, nontender, nondistended.  Bowel sounds are heard.   Extremities: No cyanosis, clubbing with bilateral lower extremity edema.  Peripheral pulses are palpable.  Left upper extremity brachiocephalic fistula Psych: Alert, awake and oriented, normal mood CNS:  No cranial nerve deficits.  Power equal in all extremities.   Skin: Warm and dry.  No rashes noted.  Consultants:   ID  Nephrology  Procedures:    Hemodialysis   internal jugular tunneled hemodialysis catheter on 03/09/2020.  Left brachiocephalic fistula placement on 03/13/2020  Data Reviewed: I have personally reviewed following labs and imaging studies  Results for orders placed or performed during the hospital encounter of 02/25/20 (from the past 24 hour(s))  Renal function panel     Status: Abnormal  Collection Time: 03/17/20  3:26 PM  Result Value Ref Range   Sodium 131 (L) 135 - 145 mmol/L   Potassium 4.1 3.5 - 5.1 mmol/L   Chloride 95 (L) 98 - 111 mmol/L   CO2 23 22 - 32 mmol/L   Glucose, Bld 124 (H) 70 - 99 mg/dL   BUN 50 (H) 8 - 23 mg/dL   Creatinine, Ser 10.40 (H) 0.44 - 1.00 mg/dL   Calcium 7.7 (L) 8.9 - 10.3 mg/dL   Phosphorus 5.7 (H) 2.5 - 4.6 mg/dL   Albumin 1.9 (L) 3.5 - 5.0 g/dL   GFR, Estimated 4 (L) >60 mL/min   Anion gap 13 5 - 15  CBC     Status: Abnormal   Collection Time: 03/17/20  3:27 PM  Result Value Ref Range   WBC 10.6 (H) 4.0 - 10.5 K/uL   RBC 2.44 (L) 3.87 - 5.11 MIL/uL   Hemoglobin 7.2 (L) 12.0 -  15.0 g/dL   HCT 22.3 (L) 36.0 - 46.0 %   MCV 91.4 80.0 - 100.0 fL   MCH 29.5 26.0 - 34.0 pg   MCHC 32.3 30.0 - 36.0 g/dL   RDW 15.4 11.5 - 15.5 %   Platelets 303 150 - 400 K/uL   nRBC 0.0 0.0 - 0.2 %  CBC     Status: Abnormal   Collection Time: 03/18/20  3:18 AM  Result Value Ref Range   WBC 7.6 4.0 - 10.5 K/uL   RBC 2.49 (L) 3.87 - 5.11 MIL/uL   Hemoglobin 7.3 (L) 12.0 - 15.0 g/dL   HCT 22.3 (L) 36.0 - 46.0 %   MCV 89.6 80.0 - 100.0 fL   MCH 29.3 26.0 - 34.0 pg   MCHC 32.7 30.0 - 36.0 g/dL   RDW 15.3 11.5 - 15.5 %   Platelets 285 150 - 400 K/uL   nRBC 0.0 0.0 - 0.2 %  Magnesium     Status: None   Collection Time: 03/18/20  3:18 AM  Result Value Ref Range   Magnesium 1.9 1.7 - 2.4 mg/dL  Phosphorus     Status: None   Collection Time: 03/18/20  3:18 AM  Result Value Ref Range   Phosphorus 3.8 2.5 - 4.6 mg/dL  Comprehensive metabolic panel     Status: Abnormal   Collection Time: 03/18/20  3:18 AM  Result Value Ref Range   Sodium 136 135 - 145 mmol/L   Potassium 3.9 3.5 - 5.1 mmol/L   Chloride 101 98 - 111 mmol/L   CO2 23 22 - 32 mmol/L   Glucose, Bld 92 70 - 99 mg/dL   BUN 22 8 - 23 mg/dL   Creatinine, Ser 5.84 (H) 0.44 - 1.00 mg/dL   Calcium 8.2 (L) 8.9 - 10.3 mg/dL   Total Protein 6.6 6.5 - 8.1 g/dL   Albumin 2.0 (L) 3.5 - 5.0 g/dL   AST 42 (H) 15 - 41 U/L   ALT 29 0 - 44 U/L   Alkaline Phosphatase 42 38 - 126 U/L   Total Bilirubin 0.7 0.3 - 1.2 mg/dL   GFR, Estimated 8 (L) >60 mL/min   Anion gap 12 5 - 15  Glucose, capillary     Status: None   Collection Time: 03/18/20  8:10 AM  Result Value Ref Range   Glucose-Capillary 86 70 - 99 mg/dL    Recent Results (from the past 240 hour(s))  Culture, blood (routine x 2)     Status: None   Collection  Time: 03/13/20  9:35 PM   Specimen: BLOOD  Result Value Ref Range Status   Specimen Description BLOOD RIGHT ARM  Final   Special Requests   Final    BOTTLES DRAWN AEROBIC AND ANAEROBIC Blood Culture adequate volume    Culture   Final    NO GROWTH 5 DAYS Performed at [facility], [address], [place], [place] [address]    Report Status 03/18/2020 FINAL  Final  Culture, blood (routine x 2)     Status: None   Collection Time: 03/13/20  9:41 PM   Specimen: BLOOD  Result Value Ref Range Status   Specimen Description BLOOD RIGHT HAND  Final   Special Requests   Final    BOTTLES DRAWN AEROBIC AND ANAEROBIC Blood Culture adequate volume   Culture   Final    NO GROWTH 5 DAYS Performed at [facility], [place] [address], [place], [place] [address]    Report Status 03/18/2020 FINAL  Final  Culture, Urine     Status: None   Collection Time: 03/14/20  3:00 AM   Specimen: Urine, Random  Result Value Ref Range Status   Specimen Description URINE, RANDOM  Final   Special Requests NONE  Final   Culture   Final    NO GROWTH Performed at [facility], [place] [address] [place], [place] [address]    Report Status 03/15/2020 FINAL  Final     Radiology Studies: CT CHEST WO CONTRAST  Result Date: 03/17/2020 CLINICAL DATA:  Sepsis, cough, post CPR EXAM: CT CHEST WITHOUT CONTRAST TECHNIQUE: Multidetector CT imaging of the chest was performed following the standard protocol without IV contrast. COMPARISON:  03/06/2020 FINDINGS: Cardiovascular: Cardiomegaly. Scattered aortic calcifications. No aneurysm. Mediastinum/Nodes: Small and borderline sized mediastinal lymph nodes are stable since prior study. Bilateral axillary borderline lymph nodes also stable since prior study. Trachea and esophagus are unremarkable. Thyroid unremarkable. Lungs/Pleura: Decreasing bilateral effusions with residual small left effusion and trace right effusion. Compressive atelectasis versus pneumonia in the left lower lobe. Upper Abdomen: Imaging into the upper abdomen demonstrates no acute findings. Musculoskeletal: Chest wall soft tissues are unremarkable. No acute bony abnormality. IMPRESSION: Stable cardiomegaly. Stable shoddy  mediastinal and bilateral axillary lymph nodes. Decreasing bilateral effusions with small residual left pleural effusion and trace right pleural effusion. Compressive atelectasis versus pneumonia in the left lower lobe. Aortic Atherosclerosis (ICD10-I70.0). Electronically Signed   By: Rolm Baptise M.D.   On: 03/17/2020 09:56   CT ABDOMEN PELVIS W CONTRAST  Result Date: 03/16/2020 CLINICAL DATA:  Abdominal pain and fever. EXAM: CT ABDOMEN AND PELVIS WITH CONTRAST TECHNIQUE: Multidetector CT imaging of the abdomen and pelvis was performed using the standard protocol following bolus administration of intravenous contrast. CONTRAST:  23m OMNIPAQUE IOHEXOL 300 MG/ML  SOLN COMPARISON:  March 03, 2020 FINDINGS: Lower chest: Mild to moderate severity atelectasis is seen within the left lung base. Mild atelectasis is also noted along the posterior aspect of the right lung base. These areas are decreased in severity when compared to the prior study. There is a small left pleural effusion. This is mildly decreased in size. Hepatobiliary: No focal liver abnormality is seen. Status post cholecystectomy. No biliary dilatation. Pancreas: Unremarkable. No pancreatic ductal dilatation or surrounding inflammatory changes. Spleen: Normal in size without focal abnormality. Adrenals/Urinary Tract: Adrenal glands are unremarkable. The right kidney is normal in size, without obstructing renal calculi, focal lesion, or hydronephrosis. A 7 mm nonobstructing renal stone is seen  within the right kidney. An evolving left perinephric hematoma is seen. This measures approximately 1.8 cm in maximum thickness. No acute hemorrhagic component is seen. A mild amount of adjacent inflammatory fat stranding is present. The urinary bladder is partially contracted and subsequently limited in evaluation. Stomach/Bowel: Stomach is within normal limits. The appendix is not identified. No evidence of bowel wall thickening, distention, or inflammatory  changes. Vascular/Lymphatic: No significant vascular findings are present. No enlarged abdominal or pelvic lymph nodes. Reproductive: Multiple parenchymal calcifications are seen within the lobulated uterus. The bilateral adnexa are unremarkable. Other: No abdominal wall hernia or abnormality. No abdominopelvic ascites. Musculoskeletal: Degenerative changes are seen within the lumbar spine. IMPRESSION: 1. Evolving left perinephric hematoma, without evidence of acute hemorrhagic component. 2. 7 mm nonobstructing renal stone within the right kidney. 3. Bibasilar atelectasis, left greater than right. 4. Small left pleural effusion. 5. Evidence of prior cholecystectomy. Electronically Signed   By: Virgina Norfolk M.D.   On: 03/16/2020 19:44   CT CHEST WO CONTRAST  Final Result    CT ABDOMEN PELVIS W CONTRAST  Final Result    DG Chest Port 1 View  Final Result    VAS [place] UPPER EXT VEIN MAPPING (PRE-OP AVF)  Final Result    IR Fluoro Guide CV Line Right  Final Result    IR US Guide Vasc Access Right  Final Result    VAS [place] LOWER EXTREMITY VENOUS (DVT)  Final Result    VAS [place] UPPER EXTREMITY VENOUS DUPLEX  Final Result    CT CHEST WO CONTRAST  Final Result    CT ABDOMEN PELVIS WO CONTRAST  Final Result    US BIOPSY (KIDNEY)  Final Result    DG Chest 2 View  Final Result    DG CHEST PORT 1 VIEW  Final Result    MR BRAIN WO CONTRAST  Final Result    MR ANGIO HEAD WO CONTRAST  Final Result    CT HEAD WO CONTRAST  Final Result    DG Abd 1 View  Final Result    DG CHEST PORT 1 VIEW  Final Result    DG Chest Port 1 View  Final Result    IR Fluoro Guide CV Line Right  Final Result    IR US Guide Vasc Access Right  Final Result    US RENAL  Final Result    CT Renal Stone Study  Final Result      Scheduled Meds: . chlorhexidine gluconate (MEDLINE KIT)  15 mL Mouth Rinse BID  . Chlorhexidine Gluconate Cloth  6 each Topical Q0600  . darbepoetin (ARANESP)  injection - DIALYSIS  60 mcg Intravenous Q Tue-HD  . dextromethorphan  30 mg Oral BID  . docusate sodium  100 mg Oral BID  . feeding supplement  237 mL Oral TID BM  . levETIRAcetam  250 mg Oral Q T,Th,Sa-HD  . levETIRAcetam  500 mg Oral BID  . mouth rinse  15 mL Mouth Rinse BID  . metoprolol tartrate  75 mg Oral BID  . multivitamin  1 tablet Oral QHS  . NIFEdipine  90 mg Oral Daily  . predniSONE  40 mg Oral Q breakfast   PRN Meds: acetaminophen, guaiFENesin, labetalol, LORazepam, melatonin, menthol-cetylpyridinium, ondansetron **OR** ondansetron (ZOFRAN) IV, oxyCODONE Continuous Infusions: . sodium chloride 10 mL/hr at 03/14/20 1441  . ceFEPime (MAXIPIME) IV 2 g (03/14/20 1443)  . vancomycin Stopped (03/17/20 1806)     LOS: 22 days    Garvin Ellena  Jencarlos Nicolson, MD [facility] 03/18/2020, 11:42 AM

## 2020-03-18 NOTE — Progress Notes (Signed)
Subjective: No new complaints   Antibiotics:  Anti-infectives (From admission, onward)   Start     Dose/Rate Route Frequency Ordered Stop   03/18/20 0900  ceFEPIme (MAXIPIME) 2 g in sodium chloride 0.9 % 100 mL IVPB        2 g 200 mL/hr over 30 Minutes Intravenous  Once 03/18/20 0751 03/18/20 0953   03/17/20 1200  vancomycin (VANCOCIN) IVPB 750 mg/150 ml premix        750 mg 150 mL/hr over 60 Minutes Intravenous Every T-Th-Sa (Hemodialysis) 03/14/20 1027     03/14/20 1200  vancomycin (VANCOCIN) IVPB 1000 mg/200 mL premix        1,000 mg 200 mL/hr over 60 Minutes Intravenous  Once 03/14/20 1027 03/14/20 1641   03/14/20 1200  ceFEPIme (MAXIPIME) 2 g in sodium chloride 0.9 % 100 mL IVPB        2 g 200 mL/hr over 30 Minutes Intravenous Every T-Th-Sa (Hemodialysis) 03/14/20 1027     03/14/20 0600  vancomycin (VANCOREADY) IVPB 1000 mg/200 mL        1,000 mg 200 mL/hr over 60 Minutes Intravenous To Short Stay 03/13/20 0712 03/13/20 0954   03/09/20 0600  vancomycin (VANCOREADY) IVPB 1000 mg/200 mL        1,000 mg 200 mL/hr over 60 Minutes Intravenous On call 03/06/20 1647 03/09/20 1542   03/06/20 1200  vancomycin (VANCOCIN) IVPB 750 mg/150 ml premix  Status:  Discontinued        750 mg 150 mL/hr over 60 Minutes Intravenous  Once 03/05/20 1426 03/05/20 1447   03/05/20 1800  vancomycin (VANCOCIN) IVPB 750 mg/150 ml premix  Status:  Discontinued        750 mg 150 mL/hr over 60 Minutes Intravenous  Once 03/05/20 0831 03/05/20 0834   03/05/20 0930  vancomycin (VANCOREADY) IVPB 1500 mg/300 mL  Status:  Discontinued        1,500 mg 150 mL/hr over 120 Minutes Intravenous  Once 03/05/20 0831 03/05/20 1447   03/05/20 0930  ceFEPIme (MAXIPIME) 1 g in sodium chloride 0.9 % 100 mL IVPB  Status:  Discontinued        1 g 200 mL/hr over 30 Minutes Intravenous Every 24 hours 03/05/20 0831 03/05/20 1447   02/29/20 1000  cefTRIAXone (ROCEPHIN) 2 g in sodium chloride 0.9 % 100 mL IVPB  Status:   Discontinued        2 g 200 mL/hr over 30 Minutes Intravenous Every 24 hours 02/29/20 0910 03/05/20 0756      Medications: Scheduled Meds: . chlorhexidine gluconate (MEDLINE KIT)  15 mL Mouth Rinse BID  . Chlorhexidine Gluconate Cloth  6 each Topical Q0600  . darbepoetin (ARANESP) injection - DIALYSIS  60 mcg Intravenous Q Tue-HD  . dextromethorphan  30 mg Oral BID  . docusate sodium  100 mg Oral BID  . feeding supplement  237 mL Oral TID BM  . levETIRAcetam  250 mg Oral Q T,Th,Sa-HD  . levETIRAcetam  500 mg Oral BID  . mouth rinse  15 mL Mouth Rinse BID  . metoprolol tartrate  75 mg Oral BID  . multivitamin  1 tablet Oral QHS  . NIFEdipine  90 mg Oral Daily  . predniSONE  40 mg Oral Q breakfast   Continuous Infusions: . sodium chloride 10 mL/hr at 03/14/20 1441  . ceFEPime (MAXIPIME) IV 2 g (03/14/20 1443)  . vancomycin Stopped (03/17/20 1806)   PRN Meds:.acetaminophen, guaiFENesin, labetalol, LORazepam,  melatonin, menthol-cetylpyridinium, ondansetron **OR** ondansetron (ZOFRAN) IV, oxyCODONE    Objective: Weight change:   Intake/Output Summary (Last 24 hours) at 03/18/2020 1051 Last data filed at 03/17/2020 1810 Gross per 24 hour  Intake --  Output 2500 ml  Net -2500 ml   Blood pressure 114/66, pulse 81, temperature 98.5 F (36.9 C), temperature source Oral, resp. rate 18, height 5' 2.99" (1.6 m), weight 65.6 kg, SpO2 95 %. Temp:  [98.2 F (36.8 C)-101 F (38.3 C)] 98.5 F (36.9 C) (03/16 0548) Pulse Rate:  [79-109] 81 (03/16 0548) Resp:  [16-28] 18 (03/16 0548) BP: (112-155)/(66-87) 114/66 (03/16 0548) SpO2:  [95 %-100 %] 95 % (03/16 0548) Weight:  [65.6 kg-68.4 kg] 65.6 kg (03/15 1810)  Physical Exam: Physical Exam Constitutional:      General: She is not in acute distress.    Appearance: She is well-developed. She is not diaphoretic.  HENT:     Head: Normocephalic and atraumatic.     Right Ear: External ear normal.     Left Ear: External ear normal.      Mouth/Throat:     Pharynx: No oropharyngeal exudate.  Eyes:     General: No scleral icterus.    Conjunctiva/sclera: Conjunctivae normal.     Pupils: Pupils are equal, round, and reactive to light.  Cardiovascular:     Rate and Rhythm: Normal rate and regular rhythm.     Heart sounds: Normal heart sounds. No murmur heard. No friction rub. No gallop.   Pulmonary:     Effort: Pulmonary effort is normal. No respiratory distress.     Breath sounds: Normal breath sounds. No wheezing.  Abdominal:     General: Bowel sounds are normal. There is no distension.     Palpations: Abdomen is soft.     Tenderness: There is no abdominal tenderness. There is no rebound.  Musculoskeletal:        General: No tenderness. Normal range of motion.  Lymphadenopathy:     Cervical: No cervical adenopathy.  Skin:    General: Skin is warm and dry.     Coloration: Skin is not pale.     Findings: No erythema or rash.  Neurological:     General: No focal deficit present.     Mental Status: She is alert and oriented to person, place, and time.     Motor: No abnormal muscle tone.     Coordination: Coordination normal.  Psychiatric:        Mood and Affect: Mood normal.        Behavior: Behavior normal.        Thought Content: Thought content normal.        Judgment: Judgment normal.     HD catheter clean, left arm surgical site clean  CBC:    BMET Recent Labs    03/17/20 1526 03/18/20 0318  NA 131* 136  K 4.1 3.9  CL 95* 101  CO2 23 23  GLUCOSE 124* 92  BUN 50* 22  CREATININE 10.40* 5.84*  CALCIUM 7.7* 8.2*     Liver Panel  Recent Labs    03/17/20 1526 03/18/20 0318  PROT  --  6.6  ALBUMIN 1.9* 2.0*  AST  --  42*  ALT  --  29  ALKPHOS  --  42  BILITOT  --  0.7       Sedimentation Rate No results for input(s): ESRSEDRATE in the last 72 hours. C-Reactive Protein Recent Labs    03/16/20 1139  CRP 25.7*    Micro Results: Recent Results (from the past 720 hour(s))   Resp Panel by RT-PCR (Flu A&B, Covid) Nasopharyngeal Swab     Status: None   Collection Time: 02/25/20  6:12 PM   Specimen: Nasopharyngeal Swab; Nasopharyngeal(NP) swabs in vial transport medium  Result Value Ref Range Status   SARS Coronavirus 2 by RT PCR NEGATIVE NEGATIVE Final    Comment: (NOTE) SARS-CoV-2 target nucleic acids are NOT DETECTED.  The SARS-CoV-2 RNA is generally detectable in upper respiratory specimens during the acute phase of infection. The lowest concentration of SARS-CoV-2 viral copies this assay can detect is 138 copies/mL. A negative result does not preclude SARS-Cov-2 infection and should not be used as the sole basis for treatment or other patient management decisions. A negative result may occur with  improper specimen collection/handling, submission of specimen other than nasopharyngeal swab, presence of viral mutation(s) within the areas targeted by this assay, and inadequate number of viral copies(<138 copies/mL). A negative result must be combined with clinical observations, patient history, and epidemiological information. The expected result is Negative.  Fact Sheet for Patients:  EntrepreneurPulse.com.au  Fact Sheet for Healthcare Providers:  IncredibleEmployment.be  This test is no t yet approved or cleared by the [place] FDA and  has been authorized for detection and/or diagnosis of SARS-CoV-2 by FDA under an Emergency Use Authorization (EUA). This EUA will remain  in effect (meaning this test can be used) for the duration of the COVID-19 declaration under Section 564(b)(1) of the Act, 21 U.S.C.section 360bbb-3(b)(1), unless the authorization is terminated  or revoked sooner.       Influenza A by PCR NEGATIVE NEGATIVE Final   Influenza B by PCR NEGATIVE NEGATIVE Final    Comment: (NOTE) The Xpert Xpress SARS-CoV-2/FLU/RSV plus assay is intended as an aid in the diagnosis of influenza from  Nasopharyngeal swab specimens and should not be used as a sole basis for treatment. Nasal washings and aspirates are unacceptable for Xpert Xpress SARS-CoV-2/FLU/RSV testing.  Fact Sheet for Patients: EntrepreneurPulse.com.au  Fact Sheet for Healthcare Providers: IncredibleEmployment.be  This test is not yet approved or cleared by the [place] FDA and has been authorized for detection and/or diagnosis of SARS-CoV-2 by FDA under an Emergency Use Authorization (EUA). This EUA will remain in effect (meaning this test can be used) for the duration of the COVID-19 declaration under Section 564(b)(1) of the Act, 21 U.S.C. section 360bbb-3(b)(1), unless the authorization is terminated or revoked.  Performed at [facility], [place] [address], [place], [place] [address]   MRSA PCR Screening     Status: None   Collection Time: 02/26/20  4:00 PM   Specimen: Nasopharyngeal  Result Value Ref Range Status   MRSA by PCR NEGATIVE NEGATIVE Final    Comment:        The GeneXpert MRSA Assay (FDA approved for NASAL specimens only), is one component of a comprehensive MRSA colonization surveillance program. It is not intended to diagnose MRSA infection nor to guide or monitor treatment for MRSA infections. Performed at [facility], [place] [address] [place], [place] [address]   Culture, Urine     Status: None   Collection Time: 02/26/20  5:08 PM   Specimen: Urine, Random  Result Value Ref Range Status   Specimen Description URINE, RANDOM  Final   Special Requests NONE  Final   Culture   Final    NO GROWTH Performed at [facility], [address] [place], [place] [address]    Report Status 02/27/2020 FINAL  Final  Culture, blood (routine x 2)     Status: None   Collection Time: 02/26/20 10:43 PM   Specimen: BLOOD  Result Value Ref Range Status   Specimen Description BLOOD SITE NOT SPECIFIED  Final   Special Requests   Final     AEROBIC BOTTLE ONLY Blood Culture results may not be optimal due to an inadequate volume of blood received in culture bottles   Culture   Final    NO GROWTH 5 DAYS Performed at [facility], [place] [address] [place], [place] [address]    Report Status 03/02/2020 FINAL  Final  Culture, blood (routine x 2)     Status: None   Collection Time: 02/26/20 10:43 PM   Specimen: BLOOD  Result Value Ref Range Status   Specimen Description BLOOD SITE NOT SPECIFIED  Final   Special Requests AEROBIC BOTTLE ONLY Blood Culture adequate volume  Final   Culture   Final    NO GROWTH 5 DAYS Performed at [place] [address] [place], [place] [address]    Report Status 03/02/2020 FINAL  Final  Urine Culture     Status: None   Collection Time: 03/04/20  5:30 AM   Specimen: Urine, Random  Result Value Ref Range Status   Specimen Description URINE, RANDOM  Final   Special Requests STERILE CUP  Final   Culture   Final    NO GROWTH Performed at [facility], [place] [address] [place], [place] [address]    Report Status 03/05/2020 FINAL  Final  Culture, blood (routine x 2)     Status: None   Collection Time: 03/04/20  8:48 PM   Specimen: BLOOD  Result Value Ref Range Status   Specimen Description BLOOD LEFT ANTECUBITAL  Final   Special Requests   Final    BOTTLES DRAWN AEROBIC AND ANAEROBIC Blood Culture results may not be optimal due to an inadequate volume of blood received in culture bottles   Culture   Final    NO GROWTH 5 DAYS Performed at [facility], [place] [address], [place], [place] [address]    Report Status 03/09/2020 FINAL  Final  Culture, blood (routine x 2)     Status: None   Collection Time: 03/04/20  8:48 PM   Specimen: BLOOD LEFT HAND  Result Value Ref Range Status   Specimen Description BLOOD LEFT HAND  Final   Special Requests   Final    BOTTLES DRAWN AEROBIC AND ANAEROBIC Blood Culture results may not be optimal due to an inadequate volume of blood  received in culture bottles   Culture   Final    NO GROWTH 5 DAYS Performed at [facility], [place] [address] [place], [place] [address]    Report Status 03/09/2020 FINAL  Final  Culture, blood (routine x 2)     Status: None   Collection Time: 03/13/20  9:35 PM   Specimen: BLOOD  Result Value Ref Range Status   Specimen Description BLOOD RIGHT ARM  Final   Special Requests   Final    BOTTLES DRAWN AEROBIC AND ANAEROBIC Blood Culture adequate volume   Culture   Final    NO GROWTH 5 DAYS Performed at [facility], [place] [address] [place],  [address]    Report Status 03/18/2020 FINAL  Final  Culture, blood (routine x  2)     Status: None   Collection Time: 03/13/20  9:41 PM   Specimen: BLOOD  Result Value Ref Range Status   Specimen Description BLOOD RIGHT HAND  Final   Special Requests   Final    BOTTLES DRAWN AEROBIC AND ANAEROBIC Blood Culture adequate volume   Culture   Final    NO GROWTH 5 DAYS Performed at [facility], [address], [place], [place] [address]    Report Status 03/18/2020 FINAL  Final  Culture, Urine     Status: None   Collection Time: 03/14/20  3:00 AM   Specimen: Urine, Random  Result Value Ref Range Status   Specimen Description URINE, RANDOM  Final   Special Requests NONE  Final   Culture   Final    NO GROWTH Performed at [facility], [place] [address], [place], [place] [address]    Report Status 03/15/2020 FINAL  Final    Studies/Results: CT CHEST WO CONTRAST  Result Date: 03/17/2020 CLINICAL DATA:  Sepsis, cough, post CPR EXAM: CT CHEST WITHOUT CONTRAST TECHNIQUE: Multidetector CT imaging of the chest was performed following the standard protocol without IV contrast. COMPARISON:  03/06/2020 FINDINGS: Cardiovascular: Cardiomegaly. Scattered aortic calcifications. No aneurysm. Mediastinum/Nodes: Small and borderline sized mediastinal lymph nodes are stable since prior study. Bilateral axillary borderline lymph nodes  also stable since prior study. Trachea and esophagus are unremarkable. Thyroid unremarkable. Lungs/Pleura: Decreasing bilateral effusions with residual small left effusion and trace right effusion. Compressive atelectasis versus pneumonia in the left lower lobe. Upper Abdomen: Imaging into the upper abdomen demonstrates no acute findings. Musculoskeletal: Chest wall soft tissues are unremarkable. No acute bony abnormality. IMPRESSION: Stable cardiomegaly. Stable shoddy mediastinal and bilateral axillary lymph nodes. Decreasing bilateral effusions with small residual left pleural effusion and trace right pleural effusion. Compressive atelectasis versus pneumonia in the left lower lobe. Aortic Atherosclerosis (ICD10-I70.0). Electronically Signed   By: Rolm Baptise M.D.   On: 03/17/2020 09:56   CT ABDOMEN PELVIS W CONTRAST  Result Date: 03/16/2020 CLINICAL DATA:  Abdominal pain and fever. EXAM: CT ABDOMEN AND PELVIS WITH CONTRAST TECHNIQUE: Multidetector CT imaging of the abdomen and pelvis was performed using the standard protocol following bolus administration of intravenous contrast. CONTRAST:  3m OMNIPAQUE IOHEXOL 300 MG/ML  SOLN COMPARISON:  March 03, 2020 FINDINGS: Lower chest: Mild to moderate severity atelectasis is seen within the left lung base. Mild atelectasis is also noted along the posterior aspect of the right lung base. These areas are decreased in severity when compared to the prior study. There is a small left pleural effusion. This is mildly decreased in size. Hepatobiliary: No focal liver abnormality is seen. Status post cholecystectomy. No biliary dilatation. Pancreas: Unremarkable. No pancreatic ductal dilatation or surrounding inflammatory changes. Spleen: Normal in size without focal abnormality. Adrenals/Urinary Tract: Adrenal glands are unremarkable. The right kidney is normal in size, without obstructing renal calculi, focal lesion, or hydronephrosis. A 7 mm nonobstructing renal stone is  seen within the right kidney. An evolving left perinephric hematoma is seen. This measures approximately 1.8 cm in maximum thickness. No acute hemorrhagic component is seen. A mild amount of adjacent inflammatory fat stranding is present. The urinary bladder is partially contracted and subsequently limited in evaluation. Stomach/Bowel: Stomach is within normal limits. The appendix is not identified. No evidence of bowel wall thickening, distention, or inflammatory changes. Vascular/Lymphatic: No significant vascular findings are present. No enlarged abdominal or pelvic lymph nodes. Reproductive: Multiple parenchymal calcifications  are seen within the lobulated uterus. The bilateral adnexa are unremarkable. Other: No abdominal wall hernia or abnormality. No abdominopelvic ascites. Musculoskeletal: Degenerative changes are seen within the lumbar spine. IMPRESSION: 1. Evolving left perinephric hematoma, without evidence of acute hemorrhagic component. 2. 7 mm nonobstructing renal stone within the right kidney. 3. Bibasilar atelectasis, left greater than right. 4. Small left pleural effusion. 5. Evidence of prior cholecystectomy. Electronically Signed   By: Virgina Norfolk M.D.   On: 03/16/2020 19:44      Assessment/Plan:  INTERVAL HISTORY:   CT chest fairly unremarkable.   Principal Problem:   Acute renal failure (HCC) Active Problems:   Normocytic anemia   Uremia   HTN (hypertension)   Perinephric hematoma   Seizure (HCC)   Lobar pneumonia (HCC)   Sepsis ([place]) versus SIRS due to autoimmune process   Fever   Glomerulonephritis   ABLA (acute blood loss anemia)   Pleural effusion   Encounter for orogastric (OG) tube placement   FSGS (focal segmental glomerulosclerosis)    Barbara Crawford is a 65 y.o. female with admission for severe low back pain and fatigue and lower extremity edema found to have nephrotic syndrome with acute kidney injury that is progressed to end-stage renal  disease need for dialysis.  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  She had a fever during her initial admission that was worked up extensively without clear-cut cause identified.  She did have a CT of the abdomen pelvis performed which showed a hematoma at the site of her renal biopsy.  Her catheter was removed and fevers seem to defervesced but they are then recurred.  She then was treated with vancomycin and cefepime for left lower lobe pneumonia but continued to have fevers.  G of the abdomen pelvis was repeated and showed the hematoma progressing in anticipated manner and was not felt to be an abscess.  CT of the chest was performed yesterday that showed her pleural effusions have diminished in size and she has an area of atelectasis versus consolidation in the left lower lobe.  Her serologic work-up for infectious disease has been unrevealing.  I have maintained suspicion that she has an autoimmune condition that is causing her fevers and her FSGS.  She has been started on prednisone and within the first day at least so far seems to not be mounting a fever.  I would continue her prednisone.  I would have her complete her vancomycin and cefepime on the 18th and then stop those antibiotics.  If she fevers through her corticosteroid trial then please call us back otherwise I will sign off for now.       LOS: 22 days   Alcide Evener 03/18/2020, 10:51 AM

## 2020-03-18 NOTE — Progress Notes (Addendum)
Patient ID: Barbara Crawford, female   DOB: 28-Feb-1955, 65 y.o.   MRN: [id number] [facility] Progress Note   Assessment/ Plan:   1.  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  Continue dialysis on TTS schedule with process underway for outpatient dialysis unit placement (this has been difficult due to the unique insurance coverage that the patient has).  She had creation of a left brachiocephalic fistula by Dr. Carlis Abbott on 3/11 and is currently getting hemodialysis via a right IJ tunneled hemodialysis catheter. 2.  Suspected healthcare associated pneumonia: On intravenous cefepime and vancomycin and remains intermittently febrile.  She has fever of unknown origin that has been worked up with imaging/cultures.  Yesterday, started her on prednisone for suspected autoimmune mechanism associated fevers (wean off in the next 3 to 4 weeks). 3.  Anemia: Likely from underlying chronic disease and surgical losses.  Will increase dose of Aranesp. 4.  Hypertension: Blood pressure under decent control, continue to monitor on current medication/hemodialysis. 5.  Secondary hyperparathyroidism: Calcium and phosphorus under acceptable control, not on binders at this time.  PTH level was 193 on 2/23-at goal for ESRD. 6.  Hyponatremia: We will begin fluid restriction of 1.2 L/day in the setting of end-stage renal disease-I discussed this with the patient.  Subjective:   Reports to be feeling somewhat better with T-max of 101 degree overnight.  Tolerated dialysis but complains that she felt very cold up there.   Objective:   BP 114/66 (BP Location: Left Arm)   Pulse 81   Temp 98.5 F (36.9 C) (Oral)   Resp 18   Ht 5' 2.99" (1.6 m)   Wt 65.6 kg   SpO2 95%   BMI 25.63 kg/m   Intake/Output Summary (Last 24 hours) at 03/18/2020 0759 Last  data filed at 03/17/2020 1810 Gross per 24 hour  Intake --  Output 2500 ml  Net -2500 ml   Weight change:   Physical Exam: Gen: Comfortably sitting up in recliner CVS: Pulse regular rhythm, normal rate, S1 and S2 normal Resp: Clear to auscultation bilaterally.  Right IJ TDC in place. Abd: Soft, flat, nontender, bowel sounds normal Ext: 1-2+ pitting edema over dorsum of feet/ankles.  Palpable thrill left BCF.  Imaging: CT CHEST WO CONTRAST  Result Date: 03/17/2020 CLINICAL DATA:  Sepsis, cough, post CPR EXAM: CT CHEST WITHOUT CONTRAST TECHNIQUE: Multidetector CT imaging of the chest was performed following the standard protocol without IV contrast. COMPARISON:  03/06/2020 FINDINGS: Cardiovascular: Cardiomegaly. Scattered aortic calcifications. No aneurysm. Mediastinum/Nodes: Small and borderline sized mediastinal lymph nodes are stable since prior study. Bilateral axillary borderline lymph nodes also stable since prior study. Trachea and esophagus are unremarkable. Thyroid unremarkable. Lungs/Pleura: Decreasing bilateral effusions with residual small left effusion and trace right effusion. Compressive atelectasis versus pneumonia in the left lower lobe. Upper Abdomen: Imaging into the upper abdomen demonstrates no acute findings. Musculoskeletal: Chest wall soft tissues are unremarkable. No acute bony abnormality. IMPRESSION: Stable cardiomegaly. Stable shoddy mediastinal and bilateral axillary lymph nodes. Decreasing bilateral effusions with small residual left pleural effusion and trace right pleural effusion. Compressive atelectasis versus pneumonia in the left lower lobe. Aortic Atherosclerosis (ICD10-I70.0). Electronically Signed   By: Rolm Baptise M.D.   On: 03/17/2020 09:56   CT ABDOMEN PELVIS W CONTRAST  Result Date: 03/16/2020 CLINICAL DATA:  Abdominal pain and fever. EXAM: CT ABDOMEN AND PELVIS  WITH CONTRAST TECHNIQUE: Multidetector CT imaging of the abdomen and pelvis was performed  using the standard protocol following bolus administration of intravenous contrast. CONTRAST:  94m OMNIPAQUE IOHEXOL 300 MG/ML  SOLN COMPARISON:  March 03, 2020 FINDINGS: Lower chest: Mild to moderate severity atelectasis is seen within the left lung base. Mild atelectasis is also noted along the posterior aspect of the right lung base. These areas are decreased in severity when compared to the prior study. There is a small left pleural effusion. This is mildly decreased in size. Hepatobiliary: No focal liver abnormality is seen. Status post cholecystectomy. No biliary dilatation. Pancreas: Unremarkable. No pancreatic ductal dilatation or surrounding inflammatory changes. Spleen: Normal in size without focal abnormality. Adrenals/Urinary Tract: Adrenal glands are unremarkable. The right kidney is normal in size, without obstructing renal calculi, focal lesion, or hydronephrosis. A 7 mm nonobstructing renal stone is seen within the right kidney. An evolving left perinephric hematoma is seen. This measures approximately 1.8 cm in maximum thickness. No acute hemorrhagic component is seen. A mild amount of adjacent inflammatory fat stranding is present. The urinary bladder is partially contracted and subsequently limited in evaluation. Stomach/Bowel: Stomach is within normal limits. The appendix is not identified. No evidence of bowel wall thickening, distention, or inflammatory changes. Vascular/Lymphatic: No significant vascular findings are present. No enlarged abdominal or pelvic lymph nodes. Reproductive: Multiple parenchymal calcifications are seen within the lobulated uterus. The bilateral adnexa are unremarkable. Other: No abdominal wall hernia or abnormality. No abdominopelvic ascites. Musculoskeletal: Degenerative changes are seen within the lumbar spine. IMPRESSION: 1. Evolving left perinephric hematoma, without evidence of acute hemorrhagic component. 2. 7 mm nonobstructing renal stone within the right  kidney. 3. Bibasilar atelectasis, left greater than right. 4. Small left pleural effusion. 5. Evidence of prior cholecystectomy. Electronically Signed   By: TVirgina NorfolkM.D.   On: 03/16/2020 19:44    Labs: BMET Recent Labs  Lab 03/12/20 0019 03/13/20 1007 03/14/20 0133 03/15/20 0419 03/16/20 0248 03/17/20 1526 03/18/20 0318  NA 132* 134* 132* 133* 133* 131* 136  K 3.6 3.6 3.9 3.8 3.7 4.1 3.9  CL 97* 97* 96* 98 97* 95* 101  CO2 22  --  _0 GLUCOSE 104* 92 90 81 83 124* 92  BUN 42* 24* 30* 19 30* 50* 22  CREATININE 8.02* 6.00* 7.16* 5.37* 7.35* 10.40* 5.84*  CALCIUM 7.9*  --  7.7* 7.8* 7.8* 7.7* 8.2*  PHOS 4.2  --   --  4.0  --  5.7* 3.8   CBC Recent Labs  Lab 03/15/20 0419 03/16/20 0248 03/17/20 1527 03/18/20 0318  WBC 10.1 8.4 10.6* 7.6  HGB 8.1* 7.6* 7.2* 7.3*  HCT 24.2* 23.3* 22.3* 22.3*  MCV 89.3 90.7 91.4 89.6  PLT 268 276 303 285    Medications:    . chlorhexidine gluconate (MEDLINE KIT)  15 mL Mouth Rinse BID  . Chlorhexidine Gluconate Cloth  6 each Topical Q0600  . darbepoetin (ARANESP) injection - DIALYSIS  60 mcg Intravenous Q Tue-HD  . dextromethorphan  30 mg Oral BID  . docusate sodium  100 mg Oral BID  . feeding supplement  237 mL Oral TID BM  . levETIRAcetam  250 mg Oral Q T,Th,Sa-HD  . levETIRAcetam  500 mg Oral BID  . mouth rinse  15 mL Mouth Rinse BID  . metoprolol tartrate  75 mg Oral BID  . multivitamin  1 tablet Oral QHS  . NIFEdipine  90 mg Oral Daily  .  predniSONE  40 mg Oral Q breakfast    Elmarie Shiley, MD 03/18/2020, 7:59 AM

## 2020-03-18 NOTE — Progress Notes (Addendum)
Nutrition Follow-up  DOCUMENTATION CODES:   Not applicable  INTERVENTION:  Consider liberalizing diet  Continue Ensure Enlive po TID, each supplement provides 350 kcal and 20 grams of protein  Continue Renal MVI daily   NUTRITION DIAGNOSIS:   Increased nutrient needs related to acute illness (renal failure requiring CRRT) as evidenced by estimated needs.  ongoing  GOAL:   Patient will meet greater than or equal to 90% of their needs  progressing  MONITOR:   PO intake,Supplement acceptance,Labs,Weight trends,I & O's  REASON FOR ASSESSMENT:   Consult Diet education  ASSESSMENT:   65 yo female admitted with acute renal failure complicated by seizure and cardiac arrest requiring intubation on 2/23. PMH includes HTN, recent COVID infection Dec 2021.  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  3/07 IJ HD cath [id number] L brachiocephalic fistula placed [id number] CT showing decreasing bilateral effusion   Pt to d/c home with home health, but d/c is on hold due to pt's continued fever. Pt also pending coordination of outpatient HD.   There continues to be a lack of PO documentation; however, pt reports appetite is as it usually is and endorses consuming Ensure well (receives TID).   Last HD 3/15, 2.5L net UF, weight after HD 65.6 kg; no wt taken today. EDW unknown at this time.   Medications: Madelin Rear, rena-vit, deltasone Labs: [phone]  Diet Order:   Diet Order            Diet Heart Room service appropriate? Yes; Fluid consistency: Thin; Fluid restriction: 1200 mL Fluid  Diet effective now                 EDUCATION NEEDS:   Education needs have been addressed  Skin:  Skin Assessment: Skin Integrity Issues: Skin Integrity Issues:: Incisions Incisions: L arm  Last BM:  3/16 type 5  Height:   Ht Readings from Last 1 Encounters:  02/26/20 5' 2.99" (1.6 m)    Weight:    Wt Readings from Last 1 Encounters:  03/17/20 65.6 kg    Ideal Body Weight:  52.3 kg  BMI:  Body mass index is 25.63 kg/m.  Estimated Nutritional Needs:   Kcal:  2100-2300  Protein:  135-155 grams  Fluid:  1000 ml + UOP    Crawford Ina, MS, RD, LDN RD pager number and weekend/on-call pager number located in [place].

## 2020-03-19 DIAGNOSIS — D62 Acute posthemorrhagic anemia: Secondary | ICD-10-CM | POA: Diagnosis not present

## 2020-03-19 DIAGNOSIS — J9621 Acute and chronic respiratory failure with hypoxia: Secondary | ICD-10-CM | POA: Diagnosis not present

## 2020-03-19 DIAGNOSIS — N179 Acute kidney failure, unspecified: Secondary | ICD-10-CM | POA: Diagnosis not present

## 2020-03-19 DIAGNOSIS — R8271 Bacteriuria: Secondary | ICD-10-CM | POA: Diagnosis not present

## 2020-03-19 LAB — CBC
HCT: 20.9 % — ABNORMAL LOW (ref 36.0–46.0)
Hemoglobin: 7 g/dL — ABNORMAL LOW (ref 12.0–15.0)
MCH: 30 pg (ref 26.0–34.0)
MCHC: 33.5 g/dL (ref 30.0–36.0)
MCV: 89.7 fL (ref 80.0–100.0)
Platelets: 282 10*3/uL (ref 150–400)
RBC: 2.33 MIL/uL — ABNORMAL LOW (ref 3.87–5.11)
RDW: 15.7 % — ABNORMAL HIGH (ref 11.5–15.5)
WBC: 7.2 10*3/uL (ref 4.0–10.5)
nRBC: 0 % (ref 0.0–0.2)

## 2020-03-19 LAB — BASIC METABOLIC PANEL
Anion gap: 11 (ref 5–15)
BUN: 38 mg/dL — ABNORMAL HIGH (ref 8–23)
CO2: 24 mmol/L (ref 22–32)
Calcium: 7.9 mg/dL — ABNORMAL LOW (ref 8.9–10.3)
Chloride: 99 mmol/L (ref 98–111)
Creatinine, Ser: 7.92 mg/dL — ABNORMAL HIGH (ref 0.44–1.00)
GFR, Estimated: 5 mL/min — ABNORMAL LOW (ref >60)
Glucose, Bld: 93 mg/dL (ref 70–99)
Potassium: 3.7 mmol/L (ref 3.5–5.1)
Sodium: 134 mmol/L — ABNORMAL LOW (ref 135–145)

## 2020-03-19 LAB — MAGNESIUM: Magnesium: 1.9 mg/dL (ref 1.7–2.4)

## 2020-03-19 LAB — GLUCOSE, CAPILLARY
Glucose-Capillary: 86 mg/dL (ref 70–99)
Glucose-Capillary: 106 mg/dL — ABNORMAL HIGH (ref 70–99)
Glucose-Capillary: 134 mg/dL — ABNORMAL HIGH (ref 70–99)

## 2020-03-19 MED ORDER — HEPARIN SODIUM (PORCINE) 1000 UNIT/ML IJ SOLN
INTRAMUSCULAR | Status: AC
  Administered 2020-03-19: 2000 [IU]
  Filled 2020-03-19: qty 2

## 2020-03-19 MED ORDER — HEPARIN SODIUM (PORCINE) 1000 UNIT/ML IJ SOLN
INTRAMUSCULAR | Status: AC
  Filled 2020-03-19: qty 3

## 2020-03-19 MED ORDER — PREDNISONE 20 MG PO TABS
30.0000 mg | ORAL_TABLET | Freq: Every day | ORAL | Status: AC
  Administered 2020-03-22 – 2020-03-26 (×5): 30 mg via ORAL
  Filled 2020-03-19 (×6): qty 1

## 2020-03-19 MED ORDER — CHLORHEXIDINE GLUCONATE 0.12 % MT SOLN
OROMUCOSAL | Status: AC
  Administered 2020-03-19: 15 mL via OROMUCOSAL
  Filled 2020-03-19: qty 15

## 2020-03-19 MED ORDER — PREDNISONE 20 MG PO TABS
20.0000 mg | ORAL_TABLET | Freq: Every day | ORAL | Status: AC
  Administered 2020-03-27 – 2020-03-31 (×4): 20 mg via ORAL
  Filled 2020-03-19 (×5): qty 1

## 2020-03-19 MED ORDER — PREDNISONE 10 MG PO TABS
10.0000 mg | ORAL_TABLET | Freq: Every day | ORAL | Status: DC
  Administered 2020-04-01 – 2020-04-03 (×3): 10 mg via ORAL
  Filled 2020-03-19 (×4): qty 1

## 2020-03-19 NOTE — Progress Notes (Signed)
Renal Navigator met with patient at HD bedside this morning. She was in good spirits and states she has confirmation from her Insurance Agent that he will cancel her [employer] and switch her back to [employer]  effective 04/03/20. Navigator asked if she could obtain this plan in writing from him, as it is patient's wishes to be accepted to an outpatient HD clinic in [place]. Navigator knows that the clinics in [place] do take BCBS, however, since [employer] will still be listed as her primary insurance until 04/03/20, there may not be anything that can be done until that time to get her accepted to [facility]. Navigator received call back from [employer] who states she has spoken with patient and will submit all of her information once income verification is obtained in order for Medicaid application to be completed. She thinks it is likely that patient will qualify for Medicaid. She also states that [facility] will complete patient's Disability and Medicare application. Patient should qualify for Medicare with ESRD dx and her work history. Navigator submitted all of this information to [employer], financial team and Director of Operations/A. Jacqualin Combes, who forwarded it to [employer] (RVP)/S. Autumn Patty. RVP has approved admission of patient as long as we have documentation of her [place] insurance effective 04/03/20. Navigator states sincere appreciation for this gesture, as this means that the outpatient HD clinic will not be reimbursed for treatments between discharge and 04/03/20.  Navigator received an email from patient's Insurance Agent/Levi Avenel from a "Gmail" account. Navigator cannot accept this as a form of documentation as it did not come from a professional account, nor did it come on a company letterhead. Navigator responded with request for one or the other or both in order to proceed with re-referring patient to  [facility], as they must know that she has an adequate payor source as of 04/03/20 in order to accept her. Navigator has not only replied to Mr. Jimmye [name] with this request, but also updated patient. She stated understanding.  In addition, Navigator still has not heard back from [place] in regards to referral made on 03/13/20 for outpatient HD seat at [facility].  Navigator has completed [facility] [place] application with patient in anticipation of obtaining an outpatient seat in [place] and her statement of need for transportation to/from.  Navigator will continue to follow closely to assist in making a safe discharge plan in this difficult situation.  Alphonzo Cruise, [place] Renal Navigator [phone]

## 2020-03-19 NOTE — Progress Notes (Signed)
PROGRESS NOTE    Barbara Crawford   QRF:[id number]  DOB: 09/26/1955  DOA: 02/25/2020     23  PCP: Vonna Drafts, FNP  Brief narrative/Hospital Course:  Barbara Crawford is a 65 yo female with past medical history of hypertension, prior Covid (Dec 2021) presented to the hospital with complaints of lower extremity swelling, back pain, generalized malaise, fatigue at home. In the ED, she was found to have a hemoglobin of 6.3, potassium 5.4, sodium 130, bicarb 9, BUN 144, and creatinine 29.98.  CT of the abdomen and pelvis was negative for obstruction or acute abnormality.  Temporary hemodialysis catheter was placed by IR and subsequently, the patient had a PEA arrest associated with seizure-like activity.  She was intubated and subsequently extubated 02/28/2020.   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 Renal biopsy done 03/03/2019.  ANA positive.  ID followed the patient for fever.Trialysis cath was removed on 03/06/20 and subsequently underwent permacath placement.  During hospitalization, patient continued to have fever so ID was consulted.   Extensive work-up was done in the past for fever.  Recent x-ray showed consolidation in the lungs.  Patient was started on vancomycin and cefepime for possible healthcare history pneumonia.  There is possibility of autoimmune disease causing kidney disease and fever.  Prednisone has been initiated improvement in fever.  We will need to follow-up with nephrology and ID.  Likely need home health on discharge, currently awaiting for outpatient hemodialysis arrangement.  Assessment & Plan:  Principal Problem:   Acute renal failure (HCC) Active Problems:   Normocytic anemia   Uremia   HTN (hypertension)   Perinephric hematoma   Seizure (HCC)   Lobar pneumonia (HCC)   Sepsis (HCC) versus SIRS due to autoimmune process   Fever   Glomerulonephritis   ABLA (acute blood loss anemia)   Pleural effusion   Encounter  for orogastric (OG) tube placement   FSGS (focal segmental glomerulosclerosis)  Cough, shortness of breath, dyspnea.  Much improved.  Will complete vancomycin and cefepime course for now.  T-max of 99.2 F.  End date of antibiotic 03/20/2020.  Urine and blood cultures are negative.  Duplex ultrasound was negative for DVT.  Continue incentive spirometry.   Fever.  T-max of 99.2 F.Marland Kitchen  Possible autoimmune etiology.  Continue prednisone.  We will continue to monitor fever trend.  CT scan of the abdomen pelvis with evolving left perinephric hematoma.  CT scan of the chest was performed which showed mediastinal and axillary lymph nodes with decreasing bilateral pleural effusion.  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  Developed a perinephric hematoma post biopsy.    CT scan of the abdomen pelvis from 03/16/2020 showed evolving left perinephric hematoma without acute hemorrhagic component.  Status post IR guided internal jugular tunneled hemodialysis catheter on 03/09/2020.  Awaiting for outpatient hemodialysis arrangement.  Pleural effusion Continue hemodialysis for volume management.   Acute blood loss anemia Stable at this time.  Continue to monitor hemoglobin.  Latest hemoglobin of 7.0  Acute glomerulonephritis Status post renal biopsy which showed a focal segmental glomerulosclerosis.  Nephrology on board.  On prednisone at this time.  Seizure  Patient had seizure with lossof pulse and PEA arrest s/p CPR.  Was seen by neurology.  Plan is to continue Keppra on discharge  Perinephric hematoma Seen on CT abdomen/pelvis on 03/03/2020.  Continue to monitor closely.  Latest hemoglobin of 7.0.  Latest CT scan showing evolving hematoma.  CBC Latest Ref Rng & Units 03/19/2020 03/18/2020 03/17/2020  WBC 4.0 - 10.5 K/uL 7.2 7.6 10.6(H)  Hemoglobin 12.0 - 15.0 g/dL 7.0(L) 7.3(L) 7.2(L)  Hematocrit 36.0 - 46.0 % 20.9(L)  22.3(L) 22.3(L)  Platelets 150 - 400 K/uL 282 285 303    Essential hypertension Continue with nifedipine, metoprolol and labetalol as needed.    Improved blood pressure on metoprolol 75 mg twice daily.  Continue the same.  Uremia Resolved.   Normocytic anemia Status post kidney biopsy and perinephric hematoma.  Status post 2 units of packed RBC on 03/03/2020.  Continue Aranesp as per nephrology.  Recent hemoglobin of 7.0  Acute on chronic respiratory failure with hypoxia  Resolved.  Status post PEA arrest.  Extubated on 02/28/2020.  Currently on room air  Cardiac arrest with pulseless electrical activity  Status post CPR and intubation.  Stable at this time.  Debility, deconditioning,  Will need home PT on discharge.  Antimicrobials: None at this time.  DVT prophylaxis: Sequential compression device due to recent hematoma     Code Status: Full Code    Family Communication:  None today.   Disposition Plan: Status is: Inpatient  Remains inpatient appropriate because:, IV treatments appropriate due to intensity of illness or inability to take PO and Inpatient level of care appropriate due to severity of illness, will need hemodialysis set up as outpatient, new fever on prednisone  Dispo:  Patient From:    Planned Disposition: Home with Health Care Svc   medically stable for discharge:  Yes  Subjective Today, patient was seen and examined at bedside.  Patient denies any shortness of breath, chest pain, fever or chills but has mild cough.  Objective:  Vitals with BMI 03/19/2020 03/19/2020 03/19/2020  Height - - -  Weight - - -  BMI - - -  Systolic 093 235 573  Diastolic 68 65 68  Pulse - - -   Body mass index is 25.9 kg/m.  Physical Examination: General:  Average built, not in obvious distress, alert awake and oriented HENT:   No scleral pallor or icterus noted. Oral mucosa is moist.  Right internal jugular permacath in place Chest:  Diminished breath sounds  bilaterally.  CVS: S1 &S2 heard. No murmur.  Regular rate and rhythm. Abdomen: Soft, nontender, nondistended.  Bowel sounds are heard.   Extremities: No cyanosis, clubbing or edema.  Peripheral pulses are palpable.  Left upper extremity brachiocephalic fistula Psych: Alert, awake and oriented, normal mood CNS:  No cranial nerve deficits.  Power equal in all extremities.   Skin: Warm and dry.  No rashes noted.   Consultants:   ID  Nephrology  Procedures:    Hemodialysis   internal jugular tunneled hemodialysis catheter on 03/09/2020.  Left brachiocephalic fistula placement on 03/13/2020  Data Reviewed: I have personally reviewed following labs and imaging studies  Results for orders placed or performed during the hospital encounter of 02/25/20 (from the past 24 hour(s))  Glucose, capillary     Status: Abnormal   Collection Time: 03/18/20 12:05 PM  Result Value Ref Range   Glucose-Capillary 113 (H) 70 - 99 mg/dL  Glucose, capillary     Status: Abnormal   Collection Time: 03/18/20  4:04 PM  Result Value Ref Range   Glucose-Capillary 117 (H) 70 - 99 mg/dL  Glucose, capillary     Status: Abnormal   Collection Time: 03/18/20  9:11 PM  Result Value Ref Range  Glucose-Capillary 110 (H) 70 - 99 mg/dL  Basic metabolic panel     Status: Abnormal   Collection Time: 03/19/20  3:48 AM  Result Value Ref Range   Sodium 134 (L) 135 - 145 mmol/L   Potassium 3.7 3.5 - 5.1 mmol/L   Chloride 99 98 - 111 mmol/L   CO2 24 22 - 32 mmol/L   Glucose, Bld 93 70 - 99 mg/dL   BUN 38 (H) 8 - 23 mg/dL   Creatinine, Ser 7.92 (H) 0.44 - 1.00 mg/dL   Calcium 7.9 (L) 8.9 - 10.3 mg/dL   GFR, Estimated 5 (L) >60 mL/min   Anion gap 11 5 - 15  CBC     Status: Abnormal   Collection Time: 03/19/20  3:48 AM  Result Value Ref Range   WBC 7.2 4.0 - 10.5 K/uL   RBC 2.33 (L) 3.87 - 5.11 MIL/uL   Hemoglobin 7.0 (L) 12.0 - 15.0 g/dL   HCT 20.9 (L) 36.0 - 46.0 %   MCV 89.7 80.0 - 100.0 fL   MCH 30.0 26.0 -  34.0 pg   MCHC 33.5 30.0 - 36.0 g/dL   RDW 15.7 (H) 11.5 - 15.5 %   Platelets 282 150 - 400 K/uL   nRBC 0.0 0.0 - 0.2 %  Magnesium     Status: None   Collection Time: 03/19/20  3:48 AM  Result Value Ref Range   Magnesium 1.9 1.7 - 2.4 mg/dL    Recent Results (from the past 240 hour(s))  Culture, blood (routine x 2)     Status: None   Collection Time: 03/13/20  9:35 PM   Specimen: BLOOD  Result Value Ref Range Status   Specimen Description BLOOD RIGHT ARM  Final   Special Requests   Final    BOTTLES DRAWN AEROBIC AND ANAEROBIC Blood Culture adequate volume   Culture   Final    NO GROWTH 5 DAYS Performed at [facility], [address] [place], [place] [address]    Report Status 03/18/2020 FINAL  Final  Culture, blood (routine x 2)     Status: None   Collection Time: 03/13/20  9:41 PM   Specimen: BLOOD  Result Value Ref Range Status   Specimen Description BLOOD RIGHT HAND  Final   Special Requests   Final    BOTTLES DRAWN AEROBIC AND ANAEROBIC Blood Culture adequate volume   Culture   Final    NO GROWTH 5 DAYS Performed at [facility], [place] [address], [place], [place] [address]    Report Status 03/18/2020 FINAL  Final  Culture, Urine     Status: None   Collection Time: 03/14/20  3:00 AM   Specimen: Urine, Random  Result Value Ref Range Status   Specimen Description URINE, RANDOM  Final   Special Requests NONE  Final   Culture   Final    NO GROWTH Performed at [facility], [place] [address], [place],  [address]    Report Status 03/15/2020 FINAL  Final     Radiology Studies: No results found. CT CHEST WO CONTRAST  Final Result    CT ABDOMEN PELVIS W CONTRAST  Final Result    DG Chest Port 1 View  Final Result    VAS [place] UPPER EXT VEIN MAPPING (PRE-OP AVF)  Final Result    IR Fluoro Guide CV Line Right  Final Result    IR US Guide Vasc Access Right  Final Result  VAS [place] LOWER EXTREMITY VENOUS (DVT)  Final Result    VAS [place]  UPPER EXTREMITY VENOUS DUPLEX  Final Result    CT CHEST WO CONTRAST  Final Result    CT ABDOMEN PELVIS WO CONTRAST  Final Result    US BIOPSY (KIDNEY)  Final Result    DG Chest 2 View  Final Result    DG CHEST PORT 1 VIEW  Final Result    MR BRAIN WO CONTRAST  Final Result    MR ANGIO HEAD WO CONTRAST  Final Result    CT HEAD WO CONTRAST  Final Result    DG Abd 1 View  Final Result    DG CHEST PORT 1 VIEW  Final Result    DG Chest Port 1 View  Final Result    IR Fluoro Guide CV Line Right  Final Result    IR US Guide Vasc Access Right  Final Result    US RENAL  Final Result    CT Renal Stone Study  Final Result      Scheduled Meds: . chlorhexidine gluconate (MEDLINE KIT)  15 mL Mouth Rinse BID  . Chlorhexidine Gluconate Cloth  6 each Topical Q0600  . darbepoetin (ARANESP) injection - DIALYSIS  60 mcg Intravenous Q Tue-HD  . dextromethorphan  30 mg Oral BID  . docusate sodium  100 mg Oral BID  . feeding supplement  237 mL Oral TID BM  . heparin sodium (porcine)      . levETIRAcetam  250 mg Oral Q T,Th,Sa-HD  . levETIRAcetam  500 mg Oral BID  . mouth rinse  15 mL Mouth Rinse BID  . metoprolol tartrate  75 mg Oral BID  . multivitamin  1 tablet Oral QHS  . NIFEdipine  90 mg Oral Daily  . [START ON 03/22/2020] predniSONE  30 mg Oral Q breakfast   Followed by  . [START ON 03/27/2020] predniSONE  20 mg Oral Q breakfast   Followed by  . [START ON 04/01/2020] predniSONE  10 mg Oral Q breakfast  . predniSONE  40 mg Oral Q breakfast   PRN Meds: acetaminophen, guaiFENesin, labetalol, LORazepam, melatonin, menthol-cetylpyridinium, ondansetron **OR** ondansetron (ZOFRAN) IV, oxyCODONE Continuous Infusions: . sodium chloride 10 mL/hr at 03/14/20 1441  . ceFEPime (MAXIPIME) IV 2 g (03/14/20 1443)     LOS: 23 days    Flora Lipps, MD [facility] 03/19/2020, 11:45 AM

## 2020-03-19 NOTE — Progress Notes (Signed)
Physical Therapy Treatment Patient Details Name: Barbara Crawford MRN: [id number] DOB: 04-17-1955 Today's Date: 03/19/2020    History of Present Illness 65 y.o. female with medical history significant of HTN. Pt had COVID in Dec, 1 week prior to presenting to ED 02/25/20 she developed swelling in BLE. Developed pain in B lower back; CT abd/pelvis - no obstruction or acute abnormality. HGB 6.3, +acute renal failure; 2/23 seizure and Code Blue with CPR x 4 minutes and intubated 2/23-2/25; CRRT; renal biopsy 2/28 with perinephric hematoma; pneumonia;  IJ catheter placed and HD initiated. Plans to have dialysis access placed 3/11    PT Comments    Patient seen post-HD and very fatigued. Agreeable to PT session, however limited by slow pace and need for rest breaks. Again discussed ability to climb 20 steps to second floor housing and she denies concern as she will, "just take it slow."     Follow Up Recommendations  Home health PT;Supervision - Intermittent     Equipment Recommendations  Rolling walker with 5" wheels    Recommendations for Other Services       Precautions / Restrictions Precautions Precautions: Fall    Mobility  Bed Mobility                    Transfers Overall transfer level: Needs assistance Equipment used: None Transfers: Sit to/from Stand Sit to Stand: Min guard         General transfer comment: from recliner with armrests and from standard toilet with grab bar  Ambulation/Gait Ambulation/Gait assistance: Min guard Gait Distance (Feet): 20 Feet (x 2) Assistive device: None Gait Pattern/deviations: Step-through pattern;Decreased stride length;Wide base of support Gait velocity: very slow   General Gait Details: very slow due to post HD fatigue   Stairs             Wheelchair Mobility    Modified Rankin (Stroke Patients Only)       Balance           Standing balance support: No upper extremity supported;During  functional activity Standing balance-Leahy Scale: Good Standing balance comment: at sink washing hands, brushing teeth, washing face (reaching outside BOS for items)                            Cognition Arousal/Alertness: Awake/alert Behavior During Therapy: Flat affect Overall Cognitive Status: Within Functional Limits for tasks assessed                                 General Comments: slower processing post HD      Exercises      General Comments General comments (skin integrity, edema, etc.): Received call from renal navigator that she needed to take re: setting up OP HD site      Pertinent Vitals/Pain Pain Assessment: No/denies pain    Home Living                      Prior Function            PT Goals (current goals can now be found in the care plan section) Acute Rehab PT Goals Time For Goal Achievement: 03/24/20 Potential to Achieve Goals: Good Progress towards PT goals: Not progressing toward goals - comment (this session due to fatigue)    Frequency    Min 3X/week  PT Plan Current plan remains appropriate    Co-evaluation              AM-PAC PT "6 Clicks" Mobility   Outcome Measure  Help needed turning from your back to your side while in a flat bed without using bedrails?: None Help needed moving from lying on your back to sitting on the side of a flat bed without using bedrails?: A Little Help needed moving to and from a bed to a chair (including a wheelchair)?: A Little Help needed standing up from a chair using your arms (e.g., wheelchair or bedside chair)?: A Little Help needed to walk in hospital room?: A Little Help needed climbing 3-5 steps with a railing? : A Little 6 Click Score: 19    End of Session   Activity Tolerance: Patient limited by fatigue Patient left: in chair;with call bell/phone within reach   PT Visit Diagnosis: Muscle weakness (generalized) (M62.81)     Time:  [id number] PT Time Calculation (min) (ACUTE ONLY): 24 min  Charges:  $Therapeutic Activity: 23-37 mins                      Arby Barrette, PT Pager [phone]    [name] 03/19/2020, 4:12 PM

## 2020-03-19 NOTE — Progress Notes (Signed)
OT Cancellation Note  Patient Details Name: Barbara Crawford MRN: [id number] DOB: 01-09-1955   Cancelled Treatment:    Reason Eval/Treat Not Completed: Patient at procedure or test/ unavailable;Other (comment) Pt in HD will check back as time allows.  Corinne Ports K., COTA/L Acute Rehabilitation Services [phone]   [name] 03/19/2020, 8:22 AM

## 2020-03-19 NOTE — Procedures (Signed)
Patient seen on Hemodialysis. BP 118/67   Pulse 80   Temp 98.6 F (37 C) (Oral)   Resp (!) 21   Ht 5' 2.99" (1.6 m)   Wt 66.3 kg   SpO2 100%   BMI 25.90 kg/m   QB 300, UF goal 2L Tolerating treatment without complaints at this time. Renal Navigator assisting with dialysis unit placement/insurance barriers.   Elmarie Shiley MD [facility]. Office # [phone] Pager # [phone] 9:45 AM

## 2020-03-20 DIAGNOSIS — R8271 Bacteriuria: Secondary | ICD-10-CM | POA: Diagnosis not present

## 2020-03-20 DIAGNOSIS — D62 Acute posthemorrhagic anemia: Secondary | ICD-10-CM | POA: Diagnosis not present

## 2020-03-20 DIAGNOSIS — N179 Acute kidney failure, unspecified: Secondary | ICD-10-CM | POA: Diagnosis not present

## 2020-03-20 DIAGNOSIS — J9621 Acute and chronic respiratory failure with hypoxia: Secondary | ICD-10-CM | POA: Diagnosis not present

## 2020-03-20 LAB — GLUCOSE, CAPILLARY
Glucose-Capillary: 91 mg/dL (ref 70–99)
Glucose-Capillary: 106 mg/dL — ABNORMAL HIGH (ref 70–99)
Glucose-Capillary: 146 mg/dL — ABNORMAL HIGH (ref 70–99)
Glucose-Capillary: 119 mg/dL — ABNORMAL HIGH (ref 70–99)

## 2020-03-20 MED ORDER — CHLORHEXIDINE GLUCONATE 0.12 % MT SOLN
OROMUCOSAL | Status: AC
  Administered 2020-03-20: 15 mL via OROMUCOSAL
  Filled 2020-03-20: qty 15

## 2020-03-20 MED ORDER — CHLORHEXIDINE GLUCONATE CLOTH 2 % EX PADS
6.0000 | MEDICATED_PAD | Freq: Every day | CUTANEOUS | Status: DC
  Administered 2020-03-20 – 2020-03-25 (×4): 6 via TOPICAL

## 2020-03-20 NOTE — Progress Notes (Signed)
Renal Navigator called [facility] to check on referral again, as it has been a full week since referral was made. She states she has not heard back from clinic and will call now and call Navigator back.  Alphonzo Cruise, [place] Renal Navigator [phone]

## 2020-03-20 NOTE — Progress Notes (Signed)
According to [employer] and "Find a Provider" website, [place] plan is, in fact, in network with [employer]. Navigator has left message with [employer] team to clarify. In the meantime, Renal Navigator has received information from [facility] Admissions Coordinator/K. Atkins that patient has been denied acceptance at [facility] by the MD who accepts [facility] because he has currently had an influx of patients and cannot currently accommodate any new patients.  Navigator will follow up on Monday to continue to work on a plan for patient.  Alphonzo Cruise, [place] Renal Navigator [phone]

## 2020-03-20 NOTE — Progress Notes (Signed)
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  Continue dialysis on TTS schedule with process underway for outpatient dialysis unit placement (this has been difficult due to the unique insurance coverage that the patient has).  She had creation of a left brachiocephalic fistula by Dr. Carlis Abbott on 3/11 and is currently getting hemodialysis via a right IJ tunneled hemodialysis catheter. Status post HD on 3/17 with 2.7 L UF.  Plan for next HD tomorrow.  # Suspected healthcare associated pneumonia: On intravenous cefepime and vancomycin and remains intermittently febrile.  She has fever of unknown origin that has been worked up with imaging/cultures.  Yesterday, started her on prednisone for suspected autoimmune mechanism associated fevers (wean off in the next 3 to 4 weeks).  # Anemia: Likely from underlying chronic disease and surgical losses.  Continue Aranesp and transfuse as needed.   # Hypertension: Blood pressure acceptable. LE  edema manage with dialysis./Ultrafiltration.  # Secondary hyperparathyroidism: Calcium and phosphorus under acceptable control, not on binders at this time.  PTH level was 193 on 2/23-at goal for ESRD.  # Hyponatremia: Managed with dialysis, continue fluid restriction.  Subjective: Seen and examined at bedside.  Reports feeling much better.  Denies nausea, vomiting, chest pain, shortness of breath.  LE edema is improving. Objective Vital signs in last 24 hours: Vitals:   03/19/20 1128 03/19/20 1219 03/19/20 1940 03/20/20 0551  BP: 131/68 119/61 118/64 125/70  Pulse:  91 99 86  Resp: 16 15    Temp: 99.2 F (37.3 C) 98.4 F (36.9 C) 99.4 F (37.4 C) 98.9 F (37.2 C)  TempSrc: Oral Oral  Oral  SpO2: 98% 99% 99% 98%  Weight:      Height:        Weight change: -2.4 kg  Intake/Output Summary (Last 24 hours) at 03/20/2020 0812 Last data filed at 03/19/2020 2245 Gross per 24 hour  Intake 320 ml  Output 2766 ml  Net -2446 ml       Labs: Basic Metabolic Panel: Recent Labs  Lab 03/15/20 0419 03/16/20 0248 03/17/20 1526 03/18/20 0318 03/19/20 0348  NA 133*   < > 131* 136 134*  K 3.8   < > 4.1 3.9 3.7  CL 98   < > 95* 101 99  CO2 25   < > _0 GLUCOSE 81   < > 124* 92 93  BUN 19   < > 50* 22 38*  CREATININE 5.37*   < > 10.40* 5.84* 7.92*  CALCIUM 7.8*   < > 7.7* 8.2* 7.9*  PHOS 4.0  --  5.7* 3.8  --    < > = values in this interval not displayed.   Liver Function Tests: Recent Labs  Lab 03/17/20 1526 03/18/20 0318  AST  --  42*  ALT  --  29  ALKPHOS  --  42  BILITOT  --  0.7  PROT  --  6.6  ALBUMIN 1.9* 2.0*   No results for input(s): LIPASE, AMYLASE in the last 168 hours. No results for input(s): AMMONIA in the last 168 hours. CBC: Recent Labs  Lab 03/15/20 0419 03/16/20 0248 03/17/20 1527 03/18/20 0318 03/19/20 0348  WBC 10.1 8.4 10.6* 7.6 7.2  HGB 8.1* 7.6* 7.2* 7.3* 7.0*  HCT 24.2* 23.3* 22.3* 22.3* 20.9*  MCV 89.3 90.7  91.4 89.6 89.7  PLT 268 276 303 285 282   Cardiac Enzymes: Recent Labs  Lab 03/16/20 1139  CKTOTAL 81   CBG: Recent Labs  Lab 03/18/20 1604 03/18/20 2111 03/19/20 1215 03/19/20 1655 03/19/20 1942  GLUCAP 117* 110* 86 106* 134*    Iron Studies: No results for input(s): IRON, TIBC, TRANSFERRIN, FERRITIN in the last 72 hours. Studies/Results: No results found.  Medications: Infusions: . sodium chloride 10 mL/hr at 03/14/20 1441  . ceFEPime (MAXIPIME) IV 2 g (03/19/20 1218)    Scheduled Medications: . chlorhexidine gluconate (MEDLINE KIT)  15 mL Mouth Rinse BID  . Chlorhexidine Gluconate Cloth  6 each Topical Q0600  . darbepoetin (ARANESP) injection - DIALYSIS  60 mcg Intravenous Q Tue-HD  . dextromethorphan  30 mg Oral BID  . docusate sodium  100  mg Oral BID  . feeding supplement  237 mL Oral TID BM  . levETIRAcetam  250 mg Oral Q T,Th,Sa-HD  . levETIRAcetam  500 mg Oral BID  . mouth rinse  15 mL Mouth Rinse BID  . metoprolol tartrate  75 mg Oral BID  . multivitamin  1 tablet Oral QHS  . NIFEdipine  90 mg Oral Daily  . [START ON 03/22/2020] predniSONE  30 mg Oral Q breakfast   Followed by  . [START ON 03/27/2020] predniSONE  20 mg Oral Q breakfast   Followed by  . [START ON 04/01/2020] predniSONE  10 mg Oral Q breakfast  . predniSONE  40 mg Oral Q breakfast    have reviewed scheduled and prn medications.  Physical Exam: General:NAD, comfortable Heart:RRR, s1s2 nl Lungs:clear b/l, no crackle Abdomen:soft, Non-tender, non-distended Extremities: Bilateral LE edema present+ Dialysis Access: Right IJ TDC, site clean.  Jairen Goldfarb Tanna Furry 03/20/2020,8:12 AM  LOS: 24 days

## 2020-03-20 NOTE — Progress Notes (Signed)
PROGRESS NOTE    Barbara Crawford   [id number]  DOB: 10/07/1955  DOA: 02/25/2020     24  PCP: Vonna Drafts, FNP  Brief narrative/Hospital Course:  Barbara Crawford is a 65 yo female with past medical history of hypertension, prior Covid (Dec 2021) presented to the hospital with complaints of lower extremity swelling, back pain, generalized malaise, fatigue at home. In the ED, she was found to have a hemoglobin of 6.3, potassium 5.4, sodium 130, bicarb 9, BUN 144, and creatinine 29.98.  CT of the abdomen and pelvis was negative for obstruction or acute abnormality.  Temporary hemodialysis catheter was placed by IR and subsequently, the patient had a PEA arrest associated with seizure-like activity.  She was intubated and subsequently extubated 02/28/2020.   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 Renal biopsy done 03/03/2019.  ANA positive.  ID followed the patient for fever.Trialysis cath was removed on 03/06/20 and subsequently underwent permacath placement.  During hospitalization, patient continued to have fever so ID was reconsulted.   Extensive work-up was done in the past for fever.  Recent x-ray showed consolidation in the lungs.  Patient was started on vancomycin and cefepime for possible healthcare associated pneumonia and has  completed the course of antibiotic.  The thought process was that there is possibility of autoimmune disease causing kidney disease and fever.  Prednisone was initiated improvement in fever.  Currently awaiting for outpatient hemodialysis arrangement.  Assessment & Plan:  Principal Problem:   Acute renal failure (HCC) Active Problems:   Normocytic anemia   Uremia   HTN (hypertension)   Perinephric hematoma   Seizure (HCC)   Lobar pneumonia (HCC)   Sepsis (HCC) versus SIRS due to autoimmune process   Fever   Glomerulonephritis   ABLA (acute blood loss anemia)   Pleural effusion   Encounter for orogastric  (OG) tube placement   FSGS (focal segmental glomerulosclerosis)  Cough, shortness of breath, dyspnea.  Much improved.  Completed vancomycin and cefepime course.  T-max of 99.4 F.    Urine and blood cultures were negative.  Duplex ultrasound was negative for DVT.  Continue incentive spirometry.   Fever.  T-max of 99.4 F. Possible autoimmune etiology.  Continue prednisone.  We will continue to monitor fever trend.  CT scan of the abdomen pelvis with evolving left perinephric hematoma.  CT scan of the chest was performed which showed mediastinal and axillary lymph nodes with decreasing bilateral pleural effusion.  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  Developed a perinephric hematoma post biopsy.    CT scan of the abdomen pelvis from 03/16/2020 showed evolving left perinephric hematoma without acute hemorrhagic component.  Status post IR guided internal jugular tunneled hemodialysis catheter on 03/09/2020.  Awaiting for outpatient hemodialysis arrangement.  Pleural effusion Continue hemodialysis for volume management.   Acute blood loss anemia Stable at this time.  Continue to monitor hemoglobin.  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  Nephrology on board.  Continue prednisone as per nephrology, plan to taper over 3 to 4 weeks  Seizure  Patient had seizure with lossof pulse and PEA arrest s/p CPR.  Was seen by neurology.  Plan is to continue Keppra on discharge  Perinephric hematoma Seen on CT abdomen/pelvis on 03/03/2020.  Continue to monitor closely.  Latest hemoglobin of 7.0.  Latest CT scan showing evolving hematoma.  Check CBC in a.m.  CBC Latest Ref Rng & Units 03/19/2020 03/18/2020 03/17/2020  WBC 4.0 - 10.5 K/uL 7.2 7.6 10.6(H)  Hemoglobin 12.0 - 15.0 g/dL 7.0(L) 7.3(L) 7.2(L)  Hematocrit 36.0 -  46.0 % 20.9(L) 22.3(L) 22.3(L)  Platelets 150 - 400 K/uL 282 285 303    Essential hypertension Continue with nifedipine, metoprolol and labetalol as needed.    Improved blood pressure on metoprolol 75 mg twice daily.  Continue the same.  Uremia Resolved.   Normocytic anemia Status post kidney biopsy and perinephric hematoma.  Status post 2 units of packed RBC on 03/03/2020.  Continue Aranesp as per nephrology.  Recent hemoglobin of 7.0  Acute on chronic respiratory failure with hypoxia  Resolved.  Status post PEA arrest.  Extubated on 02/28/2020.  Currently on room air  Cardiac arrest with pulseless electrical activity  Status post CPR and intubation.  Stable at this time.  Debility, deconditioning,  Will need home PT on discharge.  Antimicrobials: None at this time.  DVT prophylaxis: Sequential compression device due to recent hematoma     Code Status: Full Code    Family Communication:  None today.   Disposition Plan: Status is: Inpatient  Remains inpatient appropriate because:, IV treatments appropriate due to intensity of illness or inability to take PO and Inpatient level of care appropriate due to severity of illness, will need hemodialysis set up as outpatient  Dispo:  Patient From:  Home  Planned Disposition: Home with [facility]   medically stable for discharge:  Yes, improving fever so stable for disposition.  Subjective Today, patient was seen and examined at bedside.  Patient denies any shortness of breath, cough, fever, chills or rigor.   Objective:  Vitals with BMI 03/20/2020 03/19/2020 03/19/2020  Height - - -  Weight - - -  BMI - - -  Systolic 825 003 704  Diastolic 70 64 61  Pulse 86 99 91   Body mass index is 24.88 kg/m.  Physical Examination: General:  Average built, not in obvious distress, alert awake and oriented HENT:   No scleral pallor or icterus noted. Oral mucosa is moist.  Right internal jugular permacath in place Chest:   Diminished breath sounds bilaterally. No crackles or wheezes.  CVS: S1 &S2 heard. No murmur.  Regular rate and rhythm. Abdomen: Soft, nontender, nondistended.  Bowel sounds are heard.   Extremities: No cyanosis, clubbing but bilateral lower extremity edema noted.  Peripheral pulses are palpable.  Left upper extremity brachiocephalic fistula Psych: Alert, awake and oriented, normal mood CNS:  No cranial nerve deficits.  Power equal in all extremities.   Skin: Warm and dry.  No rashes noted.   Consultants:   ID  Nephrology  Procedures:    Hemodialysis   internal jugular tunneled hemodialysis catheter on 03/09/2020.  Left brachiocephalic fistula placement on 03/13/2020  Data Reviewed: I have personally reviewed following labs and imaging studies  Results for orders placed or performed during the hospital encounter of 02/25/20 (from the past 24 hour(s))  Glucose, capillary     Status: None   Collection Time: 03/19/20 12:15 PM  Result Value Ref Range   Glucose-Capillary 86 70 - 99 mg/dL  Glucose, capillary     Status: Abnormal   Collection Time: 03/19/20  4:55 PM  Result Value Ref Range   Glucose-Capillary 106 (H) 70 - 99 mg/dL  Glucose, capillary     Status: Abnormal   Collection Time:  03/19/20  7:42 PM  Result Value Ref Range   Glucose-Capillary 134 (H) 70 - 99 mg/dL  Glucose, capillary     Status: None   Collection Time: 03/20/20  8:16 AM  Result Value Ref Range   Glucose-Capillary 91 70 - 99 mg/dL    Recent Results (from the past 240 hour(s))  Culture, blood (routine x 2)     Status: None   Collection Time: 03/13/20  9:35 PM   Specimen: BLOOD  Result Value Ref Range Status   Specimen Description BLOOD RIGHT ARM  Final   Special Requests   Final    BOTTLES DRAWN AEROBIC AND ANAEROBIC Blood Culture adequate volume   Culture   Final    NO GROWTH 5 DAYS Performed at [facility], [place] [address], [place], [place] [address]    Report Status 03/18/2020 FINAL  Final   Culture, blood (routine x 2)     Status: None   Collection Time: 03/13/20  9:41 PM   Specimen: BLOOD  Result Value Ref Range Status   Specimen Description BLOOD RIGHT HAND  Final   Special Requests   Final    BOTTLES DRAWN AEROBIC AND ANAEROBIC Blood Culture adequate volume   Culture   Final    NO GROWTH 5 DAYS Performed at [facility], [place] [address], [place], [place] [address]    Report Status 03/18/2020 FINAL  Final  Culture, Urine     Status: None   Collection Time: 03/14/20  3:00 AM   Specimen: Urine, Random  Result Value Ref Range Status   Specimen Description URINE, RANDOM  Final   Special Requests NONE  Final   Culture   Final    NO GROWTH Performed at [facility], [place] [address], [place],  [address]    Report Status 03/15/2020 FINAL  Final     Radiology Studies: No results found. CT CHEST WO CONTRAST  Final Result    CT ABDOMEN PELVIS W CONTRAST  Final Result    DG Chest Port 1 View  Final Result    VAS [place] UPPER EXT VEIN MAPPING (PRE-OP AVF)  Final Result    IR Fluoro Guide CV Line Right  Final Result    IR US Guide Vasc Access Right  Final Result    VAS [place] LOWER EXTREMITY VENOUS (DVT)  Final Result    VAS [place] UPPER EXTREMITY VENOUS DUPLEX  Final Result    CT CHEST WO CONTRAST  Final Result    CT ABDOMEN PELVIS WO CONTRAST  Final Result    US BIOPSY (KIDNEY)  Final Result    DG Chest 2 View  Final Result    DG CHEST PORT 1 VIEW  Final Result    MR BRAIN WO CONTRAST  Final Result    MR ANGIO HEAD WO CONTRAST  Final Result    CT HEAD WO CONTRAST  Final Result    DG Abd 1 View  Final Result    DG CHEST PORT 1 VIEW  Final Result    DG Chest Port 1 View  Final Result    IR Fluoro Guide CV Line Right  Final Result    IR US Guide Vasc Access Right  Final Result    US RENAL  Final Result    CT Renal Stone Study  Final Result      Scheduled Meds: . chlorhexidine gluconate (MEDLINE KIT)  15 mL  Mouth Rinse BID  . Chlorhexidine Gluconate  Cloth  6 each Topical [id number]  . Chlorhexidine Gluconate Cloth  6 each Topical Q0600  . darbepoetin (ARANESP) injection - DIALYSIS  60 mcg Intravenous Q Tue-HD  . dextromethorphan  30 mg Oral BID  . docusate sodium  100 mg Oral BID  . feeding supplement  237 mL Oral TID BM  . levETIRAcetam  250 mg Oral Q T,Th,Sa-HD  . levETIRAcetam  500 mg Oral BID  . mouth rinse  15 mL Mouth Rinse BID  . metoprolol tartrate  75 mg Oral BID  . multivitamin  1 tablet Oral QHS  . NIFEdipine  90 mg Oral Daily  . [START ON 03/22/2020] predniSONE  30 mg Oral Q breakfast   Followed by  . [START ON 03/27/2020] predniSONE  20 mg Oral Q breakfast   Followed by  . [START ON 04/01/2020] predniSONE  10 mg Oral Q breakfast  . predniSONE  40 mg Oral Q breakfast   PRN Meds: acetaminophen, guaiFENesin, labetalol, LORazepam, melatonin, menthol-cetylpyridinium, ondansetron **OR** ondansetron (ZOFRAN) IV, oxyCODONE Continuous Infusions: . sodium chloride 10 mL/hr at 03/14/20 1441  . ceFEPime (MAXIPIME) IV 2 g (03/19/20 1218)     LOS: 24 days    Flora Lipps, MD [facility] 03/20/2020, 9:47 AM

## 2020-03-20 NOTE — Progress Notes (Signed)
Renal Navigator received another email from patient's [place] Meckel that documents the plan she will be switched to as of 04/03/20. The plan stated is "[place]." Per [employer], this is a Copywriter, advertising to [facility] providers and Navigator has previously stated to patient that all other [place] will be accepted with the exception of this plan. Navigator informed Mr. Jimmye [name] that patient can of course choose this plan if this is what she wants for coverage, but this will not achieve her goal of receiving outpatient dialysis in [place], which is what she has stated to Renal Navigator as her wishes. Navigator has informed Mr. Jimmye [name] that the only company for outpatient HD in [facility] is [facility] and asked that he help patient choose a plan that is in network with this company so that she can receive treatment close to home. Navigator can assist patient in a referral to outpatient dialysis in [place] once there is documentation of an insurance plan effective 04/03/20 in network with [facility]. Otherwise, Navigator will continue with referral to [facility] in [place]--currently Navigator is having trouble getting updates from this company regarding her acceptance or denial there with her current [employer].  Navigator will continue to follow closely and is trying everything possible to get patient an outpatient HD seat in order to get her discharged from the hospital as soon as possible.  Alphonzo Cruise, [place] Renal Navigator [phone]

## 2020-03-20 NOTE — Plan of Care (Signed)
°  Problem: Clinical Measurements: Goal: Will remain free from infection Outcome: Not Progressing   Problem: Clinical Measurements: Goal: Ability to maintain clinical measurements within normal limits will improve Outcome: Not Progressing Goal: Will remain free from infection Outcome: Not Progressing Goal: Diagnostic test results will improve Outcome: Not Progressing Goal: Respiratory complications will improve Outcome: Not Progressing Goal: Cardiovascular complication will be avoided Outcome: Not Progressing   Problem: Activity: Goal: Risk for activity intolerance will decrease Outcome: Not Progressing   Problem: Nutrition: Goal: Adequate nutrition will be maintained Outcome: Not Progressing   Problem: Coping: Goal: Level of anxiety will decrease Outcome: Not Progressing   Problem: Elimination: Goal: Will not experience complications related to bowel motility Outcome: Not Progressing Goal: Will not experience complications related to urinary retention Outcome: Not Progressing   Problem: Pain Managment: Goal: General experience of comfort will improve Outcome: Not Progressing

## 2020-03-20 NOTE — Progress Notes (Signed)
Occupational Therapy Treatment Patient Details Name: Barbara Crawford MRN: [id number] DOB: April 04, 1955 Today's Date: 03/20/2020    History of present illness 65 y.o. female with medical history significant of HTN. Pt had COVID in Dec, 1 week prior to presenting to ED 02/25/20 she developed swelling in BLE. Developed pain in B lower back; CT abd/pelvis - no obstruction or acute abnormality. HGB 6.3, +acute renal failure; 2/23 seizure and Code Blue with CPR x 4 minutes and intubated 2/23-2/25; CRRT; renal biopsy 2/28 with perinephric hematoma; pneumonia;  IJ catheter placed and HD initiated. Plans to have dialysis access placed 3/11   OT comments  Treatment session with focus on self-care re-education, activity pacing and household functional mobility with RW. Patient making progress toward goals functioning at overall supervision A to Min guard for toileting/hygiene tasks and grooming standing at sink level. Patient with fatigue after ambulating >44f without frequent and extended standing rest breaks 2/2 fatigue. This OT expressed concern about ability to ascend 20 steps to apartment. Patient does not seem concerned. Will continue to follow acutely.    Follow Up Recommendations  Home health OT;Supervision/Assistance - 24 hour    Equipment Recommendations  3 in 1 bedside commode;Tub/shower seat    Recommendations for Other Services      Precautions / Restrictions Precautions Precautions: Fall Restrictions Weight Bearing Restrictions: No       Mobility Bed Mobility               General bed mobility comments: Patient seated EOB upon entry.    Transfers Overall transfer level: Needs assistance Equipment used: None Transfers: Sit to/from Stand Sit to Stand: Min guard;Supervision         General transfer comment: Supervision A when well rested. Min guard with fatigue. Good recall of hand placement.    Balance Overall balance assessment: Modified Independent                                          ADL either performed or assessed with clinical judgement   ADL                           Toilet Transfer: Min gFish farm managerDetails (indicate cue type and reason): Min guard for transfer to standard commode in bathroom. Able to stand with Min guard and increased time. Toileting- CWater quality scientistand Hygiene: Min guard;Sit to/from stand Toileting - Clothing Manipulation Details (indicate cue type and reason): Min guard for safety. Requires increased time.     Functional mobility during ADLs: Min guard;Rolling walker General ADL Comments: Continues to be limited by decreased activity tolerance, generalized weakness, and decreased knowledge of deficits. Patient with difficulty ambulating >526fft this date but believes that she will be able to ascend 20 steps to apartment.     Vision       Perception     Praxis      Cognition Arousal/Alertness: Awake/alert Behavior During Therapy: WFL for tasks assessed/performed Overall Cognitive Status: Within Functional Limits for tasks assessed                                 General Comments: Smiles/laughs appropriately throughout session.        Exercises     Shoulder Instructions  General Comments      Pertinent Vitals/ Pain       Pain Assessment: No/denies pain  Home Living                                          Prior Functioning/Environment              Frequency  Min 2X/week        Progress Toward Goals  OT Goals(current goals can now be found in the care plan section)  Progress towards OT goals: Progressing toward goals  Acute Rehab OT Goals Patient Stated Goal: To return home. OT Goal Formulation: With patient Time For Goal Achievement: 04/03/20 Potential to Achieve Goals: Good ADL Goals Pt Will Perform Grooming: standing;with supervision Pt Will Perform Lower Body Dressing: with  supervision;sit to/from stand Pt Will Transfer to Toilet: ambulating;with supervision Pt Will Perform Toileting - Clothing Manipulation and hygiene: with supervision;sit to/from stand Pt Will Perform Tub/Shower Transfer: with supervision;3 in 1 Pt/caregiver will Perform Home Exercise Program: Both right and left upper extremity;Increased strength;With written HEP provided  Plan Discharge plan remains appropriate;Frequency remains appropriate    Co-evaluation                 AM-PAC OT "6 Clicks" Daily Activity     Outcome Measure   Help from another person eating meals?: A Little Help from another person taking care of personal grooming?: A Little Help from another person toileting, which includes using toliet, bedpan, or urinal?: A Little Help from another person bathing (including washing, rinsing, drying)?: A Lot Help from another person to put on and taking off regular upper body clothing?: A Little Help from another person to put on and taking off regular lower body clothing?: A Lot 6 Click Score: 16    End of Session Equipment Utilized During Treatment: Rolling walker  OT Visit Diagnosis: Unsteadiness on feet (R26.81);Muscle weakness (generalized) (M62.81)   Activity Tolerance Patient tolerated treatment well   Patient Left in chair;with call bell/phone within reach;with chair alarm set   Nurse Communication          Time: 1440-1501 OT Time Calculation (min): 21 min  Charges: OT General Charges $OT Visit: 1 Visit OT Treatments $Self Care/Home Management : 8-22 mins  Barbara Crawford H. OTR/L Supplemental OT, Department of rehab services [phone]   Barbara Crawford H. 03/20/2020, 3:11 PM

## 2020-03-21 DIAGNOSIS — J9621 Acute and chronic respiratory failure with hypoxia: Secondary | ICD-10-CM | POA: Diagnosis not present

## 2020-03-21 DIAGNOSIS — D62 Acute posthemorrhagic anemia: Secondary | ICD-10-CM | POA: Diagnosis not present

## 2020-03-21 DIAGNOSIS — R0600 Dyspnea, unspecified: Secondary | ICD-10-CM

## 2020-03-21 DIAGNOSIS — N179 Acute kidney failure, unspecified: Secondary | ICD-10-CM | POA: Diagnosis not present

## 2020-03-21 LAB — CBC
HCT: 21 % — ABNORMAL LOW (ref 36.0–46.0)
HCT: 23.1 % — ABNORMAL LOW (ref 36.0–46.0)
Hemoglobin: 7.1 g/dL — ABNORMAL LOW (ref 12.0–15.0)
Hemoglobin: 7.3 g/dL — ABNORMAL LOW (ref 12.0–15.0)
MCH: 30.2 pg (ref 26.0–34.0)
MCH: 29 pg (ref 26.0–34.0)
MCHC: 33.8 g/dL (ref 30.0–36.0)
MCHC: 31.6 g/dL (ref 30.0–36.0)
MCV: 89.4 fL (ref 80.0–100.0)
MCV: 91.7 fL (ref 80.0–100.0)
Platelets: 267 10*3/uL (ref 150–400)
Platelets: 268 10*3/uL (ref 150–400)
RBC: 2.35 MIL/uL — ABNORMAL LOW (ref 3.87–5.11)
RBC: 2.52 MIL/uL — ABNORMAL LOW (ref 3.87–5.11)
RDW: 15.3 % (ref 11.5–15.5)
RDW: 15.5 % (ref 11.5–15.5)
WBC: 8.7 10*3/uL (ref 4.0–10.5)
WBC: 9.8 10*3/uL (ref 4.0–10.5)
nRBC: 0.2 % (ref 0.0–0.2)
nRBC: 0 % (ref 0.0–0.2)

## 2020-03-21 LAB — RENAL FUNCTION PANEL
Albumin: 1.8 g/dL — ABNORMAL LOW (ref 3.5–5.0)
Anion gap: 8 (ref 5–15)
BUN: 42 mg/dL — ABNORMAL HIGH (ref 8–23)
CO2: 26 mmol/L (ref 22–32)
Calcium: 7.9 mg/dL — ABNORMAL LOW (ref 8.9–10.3)
Chloride: 98 mmol/L (ref 98–111)
Creatinine, Ser: 8.15 mg/dL — ABNORMAL HIGH (ref 0.44–1.00)
GFR, Estimated: 5 mL/min — ABNORMAL LOW (ref >60)
Glucose, Bld: 139 mg/dL — ABNORMAL HIGH (ref 70–99)
Phosphorus: 3.6 mg/dL (ref 2.5–4.6)
Potassium: 4.4 mmol/L (ref 3.5–5.1)
Sodium: 132 mmol/L — ABNORMAL LOW (ref 135–145)

## 2020-03-21 LAB — GLUCOSE, CAPILLARY
Glucose-Capillary: 88 mg/dL (ref 70–99)
Glucose-Capillary: 103 mg/dL — ABNORMAL HIGH (ref 70–99)
Glucose-Capillary: 116 mg/dL — ABNORMAL HIGH (ref 70–99)

## 2020-03-21 MED ORDER — LIDOCAINE HCL (PF) 1 % IJ SOLN: 5.0000 mL | INTRAMUSCULAR | Status: DC | PRN

## 2020-03-21 MED ORDER — PENTAFLUOROPROP-TETRAFLUOROETH EX AERO: 1.0000 "application " | INHALATION_SPRAY | CUTANEOUS | Status: DC | PRN

## 2020-03-21 MED ORDER — LIDOCAINE-PRILOCAINE 2.5-2.5 % EX CREA: 1.0000 "application " | TOPICAL_CREAM | CUTANEOUS | Status: DC | PRN

## 2020-03-21 MED ORDER — SODIUM CHLORIDE 0.9 % IV SOLN: 100.0000 mL | INTRAVENOUS | Status: DC | PRN

## 2020-03-21 MED ORDER — HEPARIN SODIUM (PORCINE) 1000 UNIT/ML IJ SOLN
INTRAMUSCULAR | Status: AC
  Filled 2020-03-21: qty 4

## 2020-03-21 MED ORDER — ALTEPLASE 2 MG IJ SOLR
2.0000 mg | Freq: Once | INTRAMUSCULAR | Status: DC | PRN
  Filled 2020-03-21: qty 2

## 2020-03-21 MED ORDER — HEPARIN SODIUM (PORCINE) 1000 UNIT/ML DIALYSIS: 1000.0000 [IU] | INTRAMUSCULAR | Status: DC | PRN

## 2020-03-21 MED ORDER — HEPARIN SODIUM (PORCINE) 1000 UNIT/ML DIALYSIS: 20.0000 [IU]/kg | INTRAMUSCULAR | Status: DC | PRN

## 2020-03-21 NOTE — Progress Notes (Signed)
PROGRESS NOTE    Barbara Crawford  SLH:[id number] DOB: 1955/10/08 DOA: 02/25/2020 PCP: Vonna Drafts, FNP   Brief Narrative:  Barbara Crawford is a 65 yo female with past medical history of hypertension, prior Covid (Dec 2021) presented to the hospital with complaints of lower extremity swelling, back pain, generalized malaise, fatigue at home. In the ED, she was found to have a hemoglobin of 6.3, potassium 5.4, sodium 130, bicarb 9, BUN 144, and creatinine 29.98.  CT of the abdomen and pelvis was negative for obstruction or acute abnormality.  Temporary hemodialysis catheter was placed by IR and subsequently, the patient had a PEA arrest associated with seizure-like activity.  She was intubated and subsequently extubated 02/28/2020.   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 Renal biopsy done 03/03/2019.  ANA positive.  ID followed the patient for fever.Trialysis cath was removed on 03/06/20 and subsequently underwent permacath placement.  During hospitalization, patient continued to have fever so ID was reconsulted.   Extensive work-up was done in the past for fever.  Recent x-ray showed consolidation in the lungs.  Patient was started on vancomycin and cefepime for possible healthcare associated pneumonia and has  completed the course of antibiotic.  The thought process was that there is possibility of autoimmune disease causing kidney disease and fever.  Prednisone was initiated improvement in fever.  Currently awaiting for outpatient hemodialysis arrangement.   Assessment & Plan:   Principal Problem:   Acute renal failure (HCC) Active Problems:   Normocytic anemia   Uremia   HTN (hypertension)   Perinephric hematoma   Seizure (HCC)   Lobar pneumonia (HCC)   Sepsis (HCC) versus SIRS due to autoimmune process   Fever   Glomerulonephritis   ABLA (acute blood loss anemia)   Pleural effusion   Encounter for orogastric (OG) tube placement   FSGS  (focal segmental glomerulosclerosis)    Cough, shortness of breath, dyspnea.  Much improved.  Completed vancomycin and cefepime course.  T-max of 98.9 F.    Urine and blood cultures were negative.  Duplex ultrasound was negative for DVT.  Continue incentive spirometry.   Fever.  T-max of 98.9. Possible autoimmune etiology.  Continue prednisone.  We will continue to monitor fever trend.  CT scan of the abdomen pelvis with evolving left perinephric hematoma.  CT scan of the chest was performed which showed mediastinal and axillary lymph nodes with decreasing bilateral pleural effusion.  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 Developed a perinephric hematoma post biopsy.   CT scan of the abdomen pelvis from 03/16/2020 showed evolving left perinephric hematoma without acute hemorrhagic component.  Status post IR guided internal jugular tunneled hemodialysis catheter on 03/09/2020.  Awaiting for outpatient hemodialysis arrangement.  Pleural effusion Continue hemodialysis for volume management.   Acute blood loss anemia Stable at this time.  Continue to monitor hemoglobin.  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  Nephrology on board.  Continue prednisone as per nephrology, plan to taper over 3 to 4 weeks  Seizure  Patient had seizure with lossof pulse and PEA arrest s/p CPR.  Was seen by neurology.  Plan is to continue Keppra on discharge  Perinephric hematoma Seen on CT abdomen/pelvis on 03/03/2020.  Continue to monitor closely.  Latest hemoglobin of 7.1  Latest CT scan showing evolving hematoma.  Check CBC in a.m.  Essential hypertension Continue with nifedipine, metoprolol and labetalol as needed.   Improved blood pressure on metoprolol 75 mg twice daily.  Continue the same.  Uremia Resolved.    Normocytic anemia Status post kidney biopsy and perinephric hematoma.  Status post 2 units of packed RBC on 03/03/2020.  Continue Aranesp as per nephrology.  Recent hemoglobin of 7.1-stable  Acute on chronic respiratory failure with hypoxia  Resolved.  Status post PEA arrest.  Extubated on 02/28/2020.  Currently on room air  Cardiac arrest with pulseless electrical activity  Status post CPR and intubation.  Stable at this time.  Debility, deconditioning,  Will need home PT on discharge   DVT prophylaxis: SCD/Compression stockings  Code Status: full    Code Status Orders  (From admission, onward)         Start     Ordered   02/25/20 2009  Full code  Continuous        02/25/20 2010        Code Status History    This patient has a current code status but no historical code status.   Advance Care Planning Activity     Family Communication: called daughter, LM ON VM Disposition Plan:     Status is: Inpatient  Remains inpatient appropriate because:, IV treatments appropriate due to intensity of illness or inability to take PO and Inpatient level of care appropriate due to severity of illness, will need hemodialysis set up as outpatient  Dispo:             Patient From:  Home             Planned Disposition: Home with Health Care Svc              medically stable for discharge:  Yes, improving fever so stable for disposition. Consults called: None Admission status: Inpatient   Consultants:   NEPH, ID  Procedures:  CT ABDOMEN PELVIS WO CONTRAST  Result Date: 03/03/2020 CLINICAL DATA:  Anemia, history of recent left renal biopsy EXAM: CT ABDOMEN AND PELVIS WITHOUT CONTRAST TECHNIQUE: Multidetector CT imaging of the abdomen and pelvis was performed following the standard protocol without IV contrast. COMPARISON:  02/25/2020 FINDINGS: Lower chest: New bilateral pleural effusions and bibasilar atelectasis are noted when compared with the prior exam. No pneumothorax is  seen. Cardiac blood pool is decreased in attenuation consistent with the underlying history of anemia. Hepatobiliary: No focal liver abnormality is seen. Status post cholecystectomy. No biliary dilatation. Pancreas: Unremarkable. No pancreatic ductal dilatation or surrounding inflammatory changes. Spleen: Normal in size without focal abnormality. Adrenals/Urinary Tract: Adrenal glands are within normal limits. Right kidney demonstrates a nonobstructing stone in the midportion measuring 8 mm. Left kidney demonstrates no obstructive change although hyperdense material is noted surrounding the lower pole of the left kidney and extending superiorly consistent with perinephric hemorrhage related to the recent biopsy. No significant impingement upon the native kidney is noted. The bladder is decompressed. Stomach/Bowel: The appendix is not well visualized. No inflammatory changes to suggest appendicitis are noted. Colon shows no obstructive or inflammatory changes. Small bowel and stomach appear within normal limits. Vascular/Lymphatic: Aortic atherosclerosis. No enlarged abdominal or pelvic lymph nodes. Reproductive: Multiple calcified uterine fibroids are noted. Other: Minimal free fluid is noted in the pelvis. Changes of anasarca are noted. Musculoskeletal: Lumbar spine degenerative changes are noted without acute abnormality. IMPRESSION: Changes consistent with the recent renal biopsy with perinephric hematoma identified. No significant impingement upon  the underlying native kidney is noted. No obstructive changes are seen. Nonobstructing midpole right renal stone stable from the prior exam. New bilateral effusions and lower lobe atelectasis. Mild changes of anasarca Electronically Signed   By: Inez Catalina M.D.   On: 03/03/2020 10:50   DG Chest 2 View  Result Date: 03/01/2020 CLINICAL DATA:  Pneumonia. EXAM: CHEST - 2 VIEW COMPARISON:  02/29/2020 FINDINGS: Right jugular central venous catheter remains in  appropriate position. Stable mild cardiomegaly. Diffuse interstitial infiltrates show no significant change. Persistent atelectasis or consolidation is seen in the left lower lobe. Probable small left pleural effusion again noted. IMPRESSION: Stable mild cardiomegaly and diffuse interstitial infiltrates. Stable left lower lobe atelectasis versus consolidation, and probable small left pleural effusion. Electronically Signed   By: Marlaine Hind M.D.   On: 03/01/2020 15:47   DG Abd 1 View  Result Date: 02/26/2020 CLINICAL DATA:  OG tube placement EXAM: ABDOMEN - 1 VIEW COMPARISON:  CT 02/25/2020 FINDINGS: Esophageal tube tip and side port overlie the proximal to mid gastric region. Gas pattern is nonobstructed. Clips in the right upper quadrant IMPRESSION: Esophageal tube tip overlies the proximal to mid gastric region. Electronically Signed   By: Donavan Foil M.D.   On: 02/26/2020 21:10   CT HEAD WO CONTRAST  Result Date: 02/27/2020 CLINICAL DATA:  65 year old female status post seizure. Status post COVID-19 in December. Admitted with acute kidney failure. EXAM: CT HEAD WITHOUT CONTRAST TECHNIQUE: Contiguous axial images were obtained from the base of the skull through the vertex without intravenous contrast. COMPARISON:  None. FINDINGS: Brain: Mild dystrophic calcifications at the bilateral basal ganglia. Cerebral volume is within normal limits for age. No midline shift, ventriculomegaly, mass effect, evidence of mass lesion, intracranial hemorrhage or evidence of cortically based acute infarction. Minor subcortical white matter hypodensity in the anterior left frontal lobe. Elsewhere gray-white matter differentiation appears normal. Vascular: Mild Calcified atherosclerosis at the skull base. No suspicious intracranial vascular hyperdensity. There is a degree of generalized intracranial artery tortuosity. The right vertebral artery appears dominant. Skull: Negative. Sinuses/Orbits: Visualized paranasal  sinuses and mastoids are clear. Other: There is fluid layering in the nasopharynx. Unclear whether the patient is intubated on the scout view. No acute orbit or scalp soft tissue finding. IMPRESSION: 1. No acute intracranial abnormality. Intracranial artery tortuosity. Mild for age nonspecific white matter changes. 2. Fluid layering in the nasopharynx. Query if the patient is intubated. Electronically Signed   By: Genevie Ann M.D.   On: 02/27/2020 04:43   CT CHEST WO CONTRAST  Result Date: 03/17/2020 CLINICAL DATA:  Sepsis, cough, post CPR EXAM: CT CHEST WITHOUT CONTRAST TECHNIQUE: Multidetector CT imaging of the chest was performed following the standard protocol without IV contrast. COMPARISON:  03/06/2020 FINDINGS: Cardiovascular: Cardiomegaly. Scattered aortic calcifications. No aneurysm. Mediastinum/Nodes: Small and borderline sized mediastinal lymph nodes are stable since prior study. Bilateral axillary borderline lymph nodes also stable since prior study. Trachea and esophagus are unremarkable. Thyroid unremarkable. Lungs/Pleura: Decreasing bilateral effusions with residual small left effusion and trace right effusion. Compressive atelectasis versus pneumonia in the left lower lobe. Upper Abdomen: Imaging into the upper abdomen demonstrates no acute findings. Musculoskeletal: Chest wall soft tissues are unremarkable. No acute bony abnormality. IMPRESSION: Stable cardiomegaly. Stable shoddy mediastinal and bilateral axillary lymph nodes. Decreasing bilateral effusions with small residual left pleural effusion and trace right pleural effusion. Compressive atelectasis versus pneumonia in the left lower lobe. Aortic Atherosclerosis (ICD10-I70.0). Electronically Signed   By: Rolm Baptise M.D.  On: 03/17/2020 09:56   CT CHEST WO CONTRAST  Result Date: 03/06/2020 CLINICAL DATA:  Fever of unknown origin. Recent COVID infection. Progressive fatigue and back pain. Recent renal biopsy. EXAM: CT CHEST WITHOUT  CONTRAST TECHNIQUE: Multidetector CT imaging of the chest was performed following the standard protocol without IV contrast. COMPARISON:  Lung bases from abdominal CT 03/03/2020. Chest radiograph 03/01/2020 FINDINGS: Cardiovascular: Moderate aortic atherosclerosis. Mild cardiomegaly. Trace pericardial fluid. Decreased density of cardiac blood pool consistent with provided history of anemia. Mediastinum/Nodes: Shotty mediastinal lymph nodes with prominent nodes measuring up to 9 mm short axis. Limited assessment for hilar adenopathy on this noncontrast exam. Small bilateral axillary lymph nodes not enlarged by size criteria. No thyroid nodule. Decompressed esophagus. Lungs/Pleura: Moderate bilateral pleural effusions. Pleural effusion on the right is not significantly changed, pleural effusion on the left has increased in size. Associated compressive atelectasis in the left greater than right lower lobe. No other focal airspace disease. No findings of pulmonary edema. Trachea and central bronchi are patent. Upper Abdomen: Left perinephric hematoma, mild improvement. Cholecystectomy. Musculoskeletal: Generalized subcutaneous body wall edema. Minimal thoracic spondylosis with endplate spurring. There are no acute or suspicious osseous abnormalities. IMPRESSION: 1. Moderate bilateral pleural effusions with compressive atelectasis in the left greater than right lower lobe. Pleural effusion on the right is not significantly changed from recent abdominal CT, pleural effusion on the left has increased in size. 2. Shotty mediastinal lymph nodes are likely reactive. 3. Generalized subcutaneous body wall edema. 4. Mild cardiomegaly. 5. Improving left perinephric hematoma. Aortic Atherosclerosis (ICD10-I70.0). Electronically Signed   By: Keith Rake M.D.   On: 03/06/2020 17:26   MR ANGIO HEAD WO CONTRAST  Result Date: 02/27/2020 CLINICAL DATA:  65 year old female status post seizure. Status post COVID-19 in December.  Admitted with acute kidney failure. EXAM: MRI HEAD WITHOUT CONTRAST MRA HEAD WITHOUT CONTRAST TECHNIQUE: Multiplanar, multiecho pulse sequences of the brain and surrounding structures were obtained without intravenous contrast. Angiographic images of the head were obtained using MRA technique without contrast. COMPARISON:  Head CT 0428 hours today. FINDINGS: MRI HEAD FINDINGS Brain: Patchy and indistinct symmetric abnormal cerebral white matter signal on DWI which appears mildly restricted (series 5, image 85 and series 6, image 35). No corresponding white matter T2 or FLAIR hyperintensity, although there is scattered small subcortical white matter T2 and FLAIR signal which appears not directly related. No other restricted diffusion. No midline shift, mass effect, evidence of mass lesion, ventriculomegaly, extra-axial collection or acute intracranial hemorrhage. Cervicomedullary junction and pituitary are within normal limits. No cortical encephalomalacia. No chronic cerebral blood products. Deep gray matter nuclei, brainstem and cerebellum appear normal. Thin slice coronal temporal lobe imaging. Hippocampal formations appear symmetric and within normal limits (series 22, image 11. Other mesial temporal lobe structures appear within normal limits. Vascular: Major intracranial vascular flow voids are preserved. Tortuous intracranial arteries, including dominant right vertebral artery. Skull and upper cervical spine: Partially visible cervical spine degeneration, up to mild C4-C5 degenerative spinal stenosis. Visualized bone marrow signal is within normal limits. Sinuses/Orbits: Negative orbits. Paranasal Visualized paranasal sinuses and mastoids are stable and well pneumatized. Other: Intubated with small volume fluid in the nasopharynx. Visible internal auditory structures appear normal. Scalp soft tissues appear negative. MRA HEAD FINDINGS Antegrade flow in the posterior circulation with tortuous dominant right  vertebral artery. Tortuous basilar artery. Normal PICA origins. No vertebrobasilar stenosis. Patent SCA and PCA origins. Posterior communicating arteries are present, the left is larger. Bilateral PCA branches are normal  aside from tortuosity. Antegrade flow in both ICA siphons. No siphon stenosis. Ophthalmic and posterior communicating artery origins are within normal limits. Patent carotid termini. Dominant left ACA A1 segment. Anterior communicating artery is normal. Visible ACA branches are within normal limits. MCA M1 segments and bifurcations are tortuous but patent without stenosis. Visible MCA branches are normal aside from tortuosity. IMPRESSION: 1. Symmetric patchy and indistinct central cerebral white matter diffusion restriction, nonspecific. Perhaps this is sequelae of Uremia or other metabolic derangement. COVID-19 related demyelination is possible, although there is no corresponding T2/FLAIR abnormality. Sequelae of seizure activity was also considered but felt unlikely. 2. Otherwise largely unremarkable for age noncontrast MRI appearance of the brain, ordinary scattered subcortical white matter signal changes most commonly due to small vessel disease. 3. Intracranial MRA is negative aside from generalized arterial tortuosity. Electronically Signed   By: Genevie Ann M.D.   On: 02/27/2020 06:20   MR BRAIN WO CONTRAST  Result Date: 02/27/2020 CLINICAL DATA:  65 year old female status post seizure. Status post COVID-19 in December. Admitted with acute kidney failure. EXAM: MRI HEAD WITHOUT CONTRAST MRA HEAD WITHOUT CONTRAST TECHNIQUE: Multiplanar, multiecho pulse sequences of the brain and surrounding structures were obtained without intravenous contrast. Angiographic images of the head were obtained using MRA technique without contrast. COMPARISON:  Head CT 0428 hours today. FINDINGS: MRI HEAD FINDINGS Brain: Patchy and indistinct symmetric abnormal cerebral white matter signal on DWI which appears  mildly restricted (series 5, image 85 and series 6, image 35). No corresponding white matter T2 or FLAIR hyperintensity, although there is scattered small subcortical white matter T2 and FLAIR signal which appears not directly related. No other restricted diffusion. No midline shift, mass effect, evidence of mass lesion, ventriculomegaly, extra-axial collection or acute intracranial hemorrhage. Cervicomedullary junction and pituitary are within normal limits. No cortical encephalomalacia. No chronic cerebral blood products. Deep gray matter nuclei, brainstem and cerebellum appear normal. Thin slice coronal temporal lobe imaging. Hippocampal formations appear symmetric and within normal limits (series 22, image 11. Other mesial temporal lobe structures appear within normal limits. Vascular: Major intracranial vascular flow voids are preserved. Tortuous intracranial arteries, including dominant right vertebral artery. Skull and upper cervical spine: Partially visible cervical spine degeneration, up to mild C4-C5 degenerative spinal stenosis. Visualized bone marrow signal is within normal limits. Sinuses/Orbits: Negative orbits. Paranasal Visualized paranasal sinuses and mastoids are stable and well pneumatized. Other: Intubated with small volume fluid in the nasopharynx. Visible internal auditory structures appear normal. Scalp soft tissues appear negative. MRA HEAD FINDINGS Antegrade flow in the posterior circulation with tortuous dominant right vertebral artery. Tortuous basilar artery. Normal PICA origins. No vertebrobasilar stenosis. Patent SCA and PCA origins. Posterior communicating arteries are present, the left is larger. Bilateral PCA branches are normal aside from tortuosity. Antegrade flow in both ICA siphons. No siphon stenosis. Ophthalmic and posterior communicating artery origins are within normal limits. Patent carotid termini. Dominant left ACA A1 segment. Anterior communicating artery is normal.  Visible ACA branches are within normal limits. MCA M1 segments and bifurcations are tortuous but patent without stenosis. Visible MCA branches are normal aside from tortuosity. IMPRESSION: 1. Symmetric patchy and indistinct central cerebral white matter diffusion restriction, nonspecific. Perhaps this is sequelae of Uremia or other metabolic derangement. COVID-19 related demyelination is possible, although there is no corresponding T2/FLAIR abnormality. Sequelae of seizure activity was also considered but felt unlikely. 2. Otherwise largely unremarkable for age noncontrast MRI appearance of the brain, ordinary scattered subcortical white matter signal changes most  commonly due to small vessel disease. 3. Intracranial MRA is negative aside from generalized arterial tortuosity. Electronically Signed   By: Genevie Ann M.D.   On: 02/27/2020 06:20   CT ABDOMEN PELVIS W CONTRAST  Result Date: 03/16/2020 CLINICAL DATA:  Abdominal pain and fever. EXAM: CT ABDOMEN AND PELVIS WITH CONTRAST TECHNIQUE: Multidetector CT imaging of the abdomen and pelvis was performed using the standard protocol following bolus administration of intravenous contrast. CONTRAST:  57mL OMNIPAQUE IOHEXOL 300 MG/ML  SOLN COMPARISON:  March 03, 2020 FINDINGS: Lower chest: Mild to moderate severity atelectasis is seen within the left lung base. Mild atelectasis is also noted along the posterior aspect of the right lung base. These areas are decreased in severity when compared to the prior study. There is a small left pleural effusion. This is mildly decreased in size. Hepatobiliary: No focal liver abnormality is seen. Status post cholecystectomy. No biliary dilatation. Pancreas: Unremarkable. No pancreatic ductal dilatation or surrounding inflammatory changes. Spleen: Normal in size without focal abnormality. Adrenals/Urinary Tract: Adrenal glands are unremarkable. The right kidney is normal in size, without obstructing renal calculi, focal lesion, or  hydronephrosis. A 7 mm nonobstructing renal stone is seen within the right kidney. An evolving left perinephric hematoma is seen. This measures approximately 1.8 cm in maximum thickness. No acute hemorrhagic component is seen. A mild amount of adjacent inflammatory fat stranding is present. The urinary bladder is partially contracted and subsequently limited in evaluation. Stomach/Bowel: Stomach is within normal limits. The appendix is not identified. No evidence of bowel wall thickening, distention, or inflammatory changes. Vascular/Lymphatic: No significant vascular findings are present. No enlarged abdominal or pelvic lymph nodes. Reproductive: Multiple parenchymal calcifications are seen within the lobulated uterus. The bilateral adnexa are unremarkable. Other: No abdominal wall hernia or abnormality. No abdominopelvic ascites. Musculoskeletal: Degenerative changes are seen within the lumbar spine. IMPRESSION: 1. Evolving left perinephric hematoma, without evidence of acute hemorrhagic component. 2. 7 mm nonobstructing renal stone within the right kidney. 3. Bibasilar atelectasis, left greater than right. 4. Small left pleural effusion. 5. Evidence of prior cholecystectomy. Electronically Signed   By: Virgina Norfolk M.D.   On: 03/16/2020 19:44   US RENAL  Result Date: 02/25/2020 CLINICAL DATA:  Acute kidney injury EXAM: RENAL / URINARY TRACT ULTRASOUND COMPLETE COMPARISON:  CT 02/25/2020 FINDINGS: Right Kidney: Renal measurements: 9.7 x 4.2 x 4.7 cm = volume: 99.6 mL. Diffusely increased renal cortical echogenicity. 9 mm shadowing calculus seen in the interpolar right kidney. Corresponds well to the finding on CT. No hydronephrosis or concerning renal mass. Left Kidney: Renal measurements: 11.1 x 5.9 x 4.0 cm = volume: 136 mL. Diffusely increased renal cortical echogenicity. No concerning renal mass, shadowing calculus or hydronephrosis. Bladder: Appears normal for degree of bladder distention. Other:  None. IMPRESSION: 1. Bilaterally increased renal cortical echogenicity compatible with medical renal disease. 2. 9 mm nonobstructive calculus in the interpolar right kidney. Electronically Signed   By: Lovena Le M.D.   On: 02/25/2020 23:18   IR Fluoro Guide CV Line Right  Result Date: 03/09/2020 INDICATION: End-stage renal disease, no current access for dialysis EXAM: ULTRASOUND GUIDANCE FOR VASCULAR ACCESS RIGHT INTERNAL JUGULAR PERMANENT HEMODIALYSIS CATHETER Date:  03/09/2020 03/09/2020 1:54 pm Radiologist:  Jerilynn Mages. Daryll Brod, MD Guidance:  Ultrasound and fluoroscopic FLUOROSCOPY TIME:  Fluoroscopy Time: 0 minutes 36 seconds (2 mGy). MEDICATIONS: 1 g vancomycin within 1 hour of the procedure ANESTHESIA/SEDATION: Versed 1.5 mg IV; Fentanyl 50 mcg IV; Moderate Sedation Time:  16 minutes  The patient was continuously monitored during the procedure by the interventional radiology nurse under my direct supervision. CONTRAST:  None. COMPLICATIONS: None immediate. PROCEDURE: Informed consent was obtained from the patient following explanation of the procedure, risks, benefits and alternatives. The patient understands, agrees and consents for the procedure. All questions were addressed. A time out was performed. Maximal barrier sterile technique utilized including caps, mask, sterile gowns, sterile gloves, large sterile drape, hand hygiene, and 2% chlorhexidine scrub. Under sterile conditions and local anesthesia, right internal jugular micropuncture venous access was performed with ultrasound. Images were obtained for documentation of the patent right internal jugular vein. A guide wire was inserted followed by a transitional dilator. Next, a 0.035 guidewire was advanced into the IVC with a 5-French catheter. Measurements were obtained from the right venotomy site to the proximal right atrium. In the right infraclavicular chest, a subcutaneous tunnel was created under sterile conditions and local anesthesia. 1% lidocaine  with epinephrine was utilized for this. The 19 cm tip to cuff palindrome catheter was tunneled subcutaneously to the venotomy site and inserted into the SVC/RA junction through a valved peel-away sheath. Position was confirmed with fluoroscopy. Images were obtained for documentation. Blood was aspirated from the catheter followed by saline and heparin flushes. The appropriate volume and strength of heparin was instilled in each lumen. Caps were applied. The catheter was secured at the tunnel site with Gelfoam and a pursestring suture. The venotomy site was closed with subcuticular Vicryl suture. Dermabond was applied to the small right neck incision. A dry sterile dressing was applied. The catheter is ready for use. No immediate complications. IMPRESSION: Ultrasound and fluoroscopically guided right internal jugular tunneled hemodialysis catheter (19 cm tip to cuff palindrome catheter). Electronically Signed   By: Jerilynn Mages.  Shick M.D.   On: 03/09/2020 14:03   IR Fluoro Guide CV Line Right  Result Date: 02/26/2020 INDICATION: Acute kidney injury EXAM: Non tunneled temporary hemodialysis catheter placement MEDICATIONS: None ANESTHESIA/SEDATION: None FLUOROSCOPY TIME:  Fluoroscopy Time: 0 minutes 24 seconds (1 mGy). COMPLICATIONS: None immediate. PROCEDURE: Informed written consent was obtained from the patient after a thorough discussion of the procedural risks, benefits and alternatives. All questions were addressed. Maximal Sterile Barrier Technique was utilized including caps, mask, sterile gowns, sterile gloves, sterile drape, hand hygiene and skin antiseptic. A timeout was performed prior to the initiation of the procedure. Right neck prepped and draped in the usual sterile fashion. All elements of maximal sterile barrier were utilized including, cap, mask, sterile gown, sterile gloves, large sterile drape, hand scrubbing and 2% Chlorhexidine for skin cleaning. The right internal jugular vein was evaluated with  ultrasound and shown to be patent. A permanent ultrasound image was obtained and placed in the patient's medical record. Using sterile gel and a sterile probe cover, the right internal jugular vein was entered with a 21 ga needle during real time ultrasound guidance. 0.018 inch guidewire placed and 21 ga needle exchanged for transitional dilator set. Utilizing fluoroscopy, 0.035 inch guidewire advanced through the needle without difficulty. Serial dilation performed, and catheter inserted over the guidewire. The tip was positioned in the right atrium. All lumens of the catheter aspirated and flushed well. The dialysis lumens were locked with Heparin. The catheter was secured to the skin with suture. The insertion site was covered with a Biopatch and sterile dressing. IMPRESSION: Right IJ temporary non tunneled hemodialysis catheter ready for use. Electronically Signed   By: Miachel Roux M.D.   On: 02/26/2020 13:50   IR  US Guide Vasc Access Right  Result Date: 03/09/2020 INDICATION: End-stage renal disease, no current access for dialysis EXAM: ULTRASOUND GUIDANCE FOR VASCULAR ACCESS RIGHT INTERNAL JUGULAR PERMANENT HEMODIALYSIS CATHETER Date:  03/09/2020 03/09/2020 1:54 pm Radiologist:  Jerilynn Mages. Daryll Brod, MD Guidance:  Ultrasound and fluoroscopic FLUOROSCOPY TIME:  Fluoroscopy Time: 0 minutes 36 seconds (2 mGy). MEDICATIONS: 1 g vancomycin within 1 hour of the procedure ANESTHESIA/SEDATION: Versed 1.5 mg IV; Fentanyl 50 mcg IV; Moderate Sedation Time:  16 minutes The patient was continuously monitored during the procedure by the interventional radiology nurse under my direct supervision. CONTRAST:  None. COMPLICATIONS: None immediate. PROCEDURE: Informed consent was obtained from the patient following explanation of the procedure, risks, benefits and alternatives. The patient understands, agrees and consents for the procedure. All questions were addressed. A time out was performed. Maximal barrier sterile technique  utilized including caps, mask, sterile gowns, sterile gloves, large sterile drape, hand hygiene, and 2% chlorhexidine scrub. Under sterile conditions and local anesthesia, right internal jugular micropuncture venous access was performed with ultrasound. Images were obtained for documentation of the patent right internal jugular vein. A guide wire was inserted followed by a transitional dilator. Next, a 0.035 guidewire was advanced into the IVC with a 5-French catheter. Measurements were obtained from the right venotomy site to the proximal right atrium. In the right infraclavicular chest, a subcutaneous tunnel was created under sterile conditions and local anesthesia. 1% lidocaine with epinephrine was utilized for this. The 19 cm tip to cuff palindrome catheter was tunneled subcutaneously to the venotomy site and inserted into the SVC/RA junction through a valved peel-away sheath. Position was confirmed with fluoroscopy. Images were obtained for documentation. Blood was aspirated from the catheter followed by saline and heparin flushes. The appropriate volume and strength of heparin was instilled in each lumen. Caps were applied. The catheter was secured at the tunnel site with Gelfoam and a pursestring suture. The venotomy site was closed with subcuticular Vicryl suture. Dermabond was applied to the small right neck incision. A dry sterile dressing was applied. The catheter is ready for use. No immediate complications. IMPRESSION: Ultrasound and fluoroscopically guided right internal jugular tunneled hemodialysis catheter (19 cm tip to cuff palindrome catheter). Electronically Signed   By: Jerilynn Mages.  Shick M.D.   On: 03/09/2020 14:03   IR US Guide Vasc Access Right  Result Date: 02/26/2020 INDICATION: Acute kidney injury EXAM: Non tunneled temporary hemodialysis catheter placement MEDICATIONS: None ANESTHESIA/SEDATION: None FLUOROSCOPY TIME:  Fluoroscopy Time: 0 minutes 24 seconds (1 mGy). COMPLICATIONS: None  immediate. PROCEDURE: Informed written consent was obtained from the patient after a thorough discussion of the procedural risks, benefits and alternatives. All questions were addressed. Maximal Sterile Barrier Technique was utilized including caps, mask, sterile gowns, sterile gloves, sterile drape, hand hygiene and skin antiseptic. A timeout was performed prior to the initiation of the procedure. Right neck prepped and draped in the usual sterile fashion. All elements of maximal sterile barrier were utilized including, cap, mask, sterile gown, sterile gloves, large sterile drape, hand scrubbing and 2% Chlorhexidine for skin cleaning. The right internal jugular vein was evaluated with ultrasound and shown to be patent. A permanent ultrasound image was obtained and placed in the patient's medical record. Using sterile gel and a sterile probe cover, the right internal jugular vein was entered with a 21 ga needle during real time ultrasound guidance. 0.018 inch guidewire placed and 21 ga needle exchanged for transitional dilator set. Utilizing fluoroscopy, 0.035 inch guidewire advanced through the needle  without difficulty. Serial dilation performed, and catheter inserted over the guidewire. The tip was positioned in the right atrium. All lumens of the catheter aspirated and flushed well. The dialysis lumens were locked with Heparin. The catheter was secured to the skin with suture. The insertion site was covered with a Biopatch and sterile dressing. IMPRESSION: Right IJ temporary non tunneled hemodialysis catheter ready for use. Electronically Signed   By: Miachel Roux M.D.   On: 02/26/2020 13:50   DG Chest Port 1 View  Result Date: 03/13/2020 CLINICAL DATA:  Pneumonia EXAM: PORTABLE CHEST 1 VIEW COMPARISON:  03/01/2020 FINDINGS: Single frontal view of the chest demonstrates increased opacification of the lower left hemithorax, compatible with increased consolidation and effusion. Right chest is clear. No  pneumothorax. Cardiac silhouette is stable. Right internal jugular dialysis catheter tip overlies superior vena cava. IMPRESSION: 1. Increasing left basilar consolidation and effusion, compatible with given history of pneumonia. Electronically Signed   By: Randa Ngo M.D.   On: 03/13/2020 21:12   DG CHEST PORT 1 VIEW  Result Date: 02/29/2020 CLINICAL DATA:  Fever and chills. EXAM: PORTABLE CHEST 1 VIEW COMPARISON:  02/26/2020 FINDINGS: Right IJ central venous catheter has tip over the SVC and unchanged. Lungs are somewhat hypoinflated. The left base/retrocardiac region is difficult to evaluate due to prominent overlying soft tissues as cannot exclude interspace process or small effusion in the left base. Subtle hazy prominence of the perihilar vessels which may be due to a degree of vascular congestion. Stable borderline cardiomegaly. Remainder of the exam is unchanged. IMPRESSION: 1. Suggestion of minimal vascular congestion. The left base is not well evaluated on this exam as airspace process or a small effusion is possible. Consider PA and lateral chest radiograph for better evaluation of the left base. 2. Right IJ central venous catheter unchanged. Electronically Signed   By: Marin Olp M.D.   On: 02/29/2020 10:56   DG CHEST PORT 1 VIEW  Result Date: 02/26/2020 CLINICAL DATA:  Ventilator dependent. EXAM: PORTABLE CHEST 1 VIEW COMPARISON:  Same day. FINDINGS: Stable cardiomediastinal silhouette. Endotracheal tube is in grossly good position. No pneumothorax or pleural effusion is noted. Both lungs are clear. The visualized skeletal structures are unremarkable. IMPRESSION: No active disease. Electronically Signed   By: Marijo Conception M.D.   On: 02/26/2020 15:58   DG Chest Port 1 View  Result Date: 02/26/2020 CLINICAL DATA:  65 year old female with history of hemodialysis catheter placement. Renal failure. EXAM: PORTABLE CHEST 1 VIEW COMPARISON:  Chest x-ray 01/28/2016. FINDINGS: New right IJ  Vas-Cath with tip terminating in the right atrium. Lung volumes are low. No consolidative airspace disease. No pleural effusions. No pneumothorax. No pulmonary nodule or mass noted. Pulmonary vasculature is normal. Heart size is mildly enlarged. Upper mediastinal contours are within normal limits. IMPRESSION: 1. New right IJ Vas-Cath with tip terminating in the right atrium. No pneumothorax or other acute complicating features. 2. Mild cardiomegaly. Electronically Signed   By: Vinnie Langton M.D.   On: 02/26/2020 12:06   EEG adult  Result Date: 02/27/2020 Lora Havens, MD     02/27/2020  8:41 AM Patient Name: Dilyn Osoria MRN: [id number] Epilepsy Attending: Lora Havens Referring Physician/Provider: Dr Marianna Payment Date: 02/27/2020 Duration: 22.15 mins Patient history: 66 year old woman with a past medical history significant for recent COVID-19 infection and hypertension presenting with renal failure currently of unclear etiology, complicated by seizure and loss of pulse s/p CPR with initial good return to baseline.  EEG to evaluate for seizures. Level of alertness: Awake AEDs during EEG study: Keppra Technical aspects: This EEG study was done with scalp electrodes positioned according to the 10-20 International system of electrode placement. Electrical activity was acquired at a sampling rate of $Remov'500Hz'uvqjyR$  and reviewed with a high frequency filter of $RemoveB'70Hz'RcKagfow$  and a low frequency filter of $RemoveB'1Hz'xvPBmQHE$ . EEG data were recorded continuously and digitally stored. Description: The posterior dominant rhythm consists of 9-10 Hz activity of moderate voltage (25-35 uV) seen predominantly in posterior head regions, symmetric and reactive to eye opening and eye closing. EEG showed intermittent generalized 5 to 6 Hz theta slowing as well as intermittent generalized rhythmic 2 to 3 Hz delta slowing. Hyperventilation and photic stimulation were not performed. ABNORMALITY -Intermittent slow, generalized -intermittent rhythmic  slow, generalized IMPRESSION: This study is suggestive of mild to moderate diffuse encephalopathy, nonspecific etiology. No seizures or epileptiform discharges were seen throughout the recording. Lora Havens   ECHOCARDIOGRAM COMPLETE  Result Date: 03/06/2020    ECHOCARDIOGRAM REPORT   Patient Name:   JAMECA CHUMLEY Date of Exam: 03/06/2020 Medical Rec #:  [id number]             Height:       63.0 in Accession #:    [id number]            Weight:       167.5 lb Date of Birth:  1955/10/29            BSA:          1.793 m Patient Age:    31 years              BP:           151/95 mmHg Patient Gender: F                     HR:           121 bpm. Exam Location:  Inpatient Procedure: 2D Echo, Cardiac Doppler and Color Doppler Indications:    Fever  History:        Patient has no prior history of Echocardiogram examinations.                 Risk Factors:Hypertension. H/O COVID-19 infection.  Sonographer:    Clayton Lefort RDCS (AE) Referring Phys: [id number] [place]  1. Left ventricular ejection fraction, by estimation, is 50 to 55%. The left ventricle has low normal function. The left ventricle has no regional wall motion abnormalities. There is moderate left ventricular hypertrophy. Indeterminate diastolic filling  due to E-A fusion. There is incoordinate septal motion.  2. Right ventricular systolic function is normal. The right ventricular size is normal. There is normal pulmonary artery systolic pressure. The estimated right ventricular systolic pressure is 96.2 mmHg.  3. Left atrial size was moderately dilated.  4. The pericardial effusion is posterior to the left ventricle.  5. The mitral valve is grossly normal. Trivial mitral valve regurgitation.  6. The aortic valve is tricuspid. Aortic valve regurgitation is not visualized. Mild aortic valve sclerosis is present, with no evidence of aortic valve stenosis.  7. The inferior vena cava is normal in size with greater than 50% respiratory  variability, suggesting right atrial pressure of 3 mmHg. Comparison(s): No prior Echocardiogram. Conclusion(s)/Recommendation(s): No evidence of valvular vegetations on this transthoracic echocardiogram. Would recommend a transesophageal echocardiogram to exclude infective endocarditis if clinically indicated. FINDINGS  Left Ventricle: Left ventricular ejection  fraction, by estimation, is 50 to 55%. The left ventricle has low normal function. The left ventricle has no regional wall motion abnormalities. The left ventricular internal cavity size was normal in size. There is moderate left ventricular hypertrophy. Incoordinate septal motion. Indeterminate diastolic filling due to E-A fusion. Right Ventricle: The right ventricular size is normal. No increase in right ventricular wall thickness. Right ventricular systolic function is normal. There is normal pulmonary artery systolic pressure. The tricuspid regurgitant velocity is 2.62 m/s, and  with an assumed right atrial pressure of 3 mmHg, the estimated right ventricular systolic pressure is 83.4 mmHg. Left Atrium: Left atrial size was moderately dilated. Right Atrium: Right atrial size was normal in size. Pericardium: Trivial pericardial effusion is present. The pericardial effusion is posterior to the left ventricle. Mitral Valve: The mitral valve is grossly normal. Trivial mitral valve regurgitation. Tricuspid Valve: The tricuspid valve is grossly normal. Tricuspid valve regurgitation is mild. Aortic Valve: The aortic valve is tricuspid. Aortic valve regurgitation is not visualized. Mild aortic valve sclerosis is present, with no evidence of aortic valve stenosis. Aortic valve mean gradient measures 9.0 mmHg. Aortic valve peak gradient measures 16.5 mmHg. Aortic valve area, by VTI measures 2.12 cm. Pulmonic Valve: The pulmonic valve was normal in structure. Pulmonic valve regurgitation is not visualized. Aorta: The aortic root and ascending aorta are structurally  normal, with no evidence of dilitation. Venous: The inferior vena cava is normal in size with greater than 50% respiratory variability, suggesting right atrial pressure of 3 mmHg. IAS/Shunts: No atrial level shunt detected by color flow Doppler. Additional Comments: There is a small pleural effusion in the left lateral region.  LEFT VENTRICLE PLAX 2D LVIDd:         4.10 cm LVIDs:         3.00 cm LV PW:         1.50 cm LV IVS:        1.40 cm LVOT diam:     2.10 cm LV SV:         66 LV SV Index:   37 LVOT Area:     3.46 cm  RIGHT VENTRICLE          IVC RV Basal diam:  3.40 cm  IVC diam: 1.60 cm LEFT ATRIUM             Index       RIGHT ATRIUM           Index LA diam:        2.60 cm 1.45 cm/m  RA Area:     17.80 cm LA Vol (A2C):   66.7 ml 37.19 ml/m RA Volume:   49.50 ml  27.60 ml/m LA Vol (A4C):   86.4 ml 48.17 ml/m LA Biplane Vol: 76.4 ml 42.60 ml/m  AORTIC VALVE AV Area (Vmax):    2.18 cm AV Area (Vmean):   2.03 cm AV Area (VTI):     2.12 cm AV Vmax:           203.00 cm/s AV Vmean:          145.000 cm/s AV VTI:            0.310 m AV Peak Grad:      16.5 mmHg AV Mean Grad:      9.0 mmHg LVOT Vmax:         128.00 cm/s LVOT Vmean:        84.900 cm/s LVOT VTI:  0.190 m LVOT/AV VTI ratio: 0.61  AORTA Ao Root diam: 3.00 cm Ao Asc diam:  3.20 cm TRICUSPID VALVE TR Peak grad:   27.5 mmHg TR Vmax:        262.00 cm/s  SHUNTS Systemic VTI:  0.19 m Systemic Diam: 2.10 cm Lyman Bishop MD Electronically signed by Lyman Bishop MD Signature Date/Time: 03/06/2020/3:22:26 PM    Final    CT Renal Stone Study  Result Date: 02/25/2020 CLINICAL DATA:  Flank pain for the past week. EXAM: CT ABDOMEN AND PELVIS WITHOUT CONTRAST TECHNIQUE: Multidetector CT imaging of the abdomen and pelvis was performed following the standard protocol without IV contrast. COMPARISON:  None. FINDINGS: Lower chest: No acute abnormality. Hepatobiliary: No focal liver abnormality is seen. Status post cholecystectomy. No biliary dilatation.  Pancreas: Unremarkable. No pancreatic ductal dilatation or surrounding inflammatory changes. Spleen: Normal in size without focal abnormality. Adrenals/Urinary Tract: Adrenal glands and left kidney are unremarkable. 6 mm nonobstructive right renal calculus. No hydronephrosis. The bladder is unremarkable for the degree of distention. Stomach/Bowel: Stomach is within normal limits. Diminutive or absent appendix. No evidence of bowel wall thickening, distention, or inflammatory changes. Vascular/Lymphatic: No significant vascular findings are present. No enlarged abdominal or pelvic lymph nodes. Reproductive: Small calcified uterine fibroids.  No adnexal mass. Other: No abdominal wall hernia or abnormality. No abdominopelvic ascites. No pneumoperitoneum. Musculoskeletal: No acute or significant osseous findings. IMPRESSION: 1. No acute intra-abdominal process. 2. Nonobstructive right nephrolithiasis. Electronically Signed   By: Titus Dubin M.D.   On: 02/25/2020 19:39   US BIOPSY (KIDNEY)  Result Date: 03/02/2020 CLINICAL DATA:  Renal insufficiency, acute kidney injury, hypertension EXAM: ULTRASOUND GUIDED RENAL CORE BIOPSY COMPARISON:  CT 02/25/2020 TECHNIQUE: Survey ultrasound was performed and an appropriate skin entry site was localized. Markedly echogenic renal parenchyma noted bilaterally. Site was marked, prepped with Betadine, draped in usual sterile fashion, infiltrated locally with 1% lidocaine. Intravenous Fentanyl 39mcg and Versed $RemoveBe'1mg'CjnMPYDDO$  were administered as conscious sedation during continuous monitoring of the patient's level of consciousness and physiological / cardiorespiratory status by the radiology RN, with a total moderate sedation time of 12 minutes. Under real time ultrasound guidance, a 15 gauge trocar needle was advanced to the margin of the lower pole of the left kidney for 3 coaxial 16 gauge core biopsy needle passes. The core samples were submitted to pathology. The patient tolerated  procedure well. COMPLICATIONS: None immediate IMPRESSION: 1. Technically successful ultrasound-guided core renal biopsy , left lower pole. Electronically Signed   By: Lucrezia Europe M.D.   On: 03/02/2020 15:45   VAS [place] LOWER EXTREMITY VENOUS (DVT)  Result Date: 03/07/2020  Lower Venous DVT Study Indications: Fever of unknown origin.  Comparison Study: No prior study Performing Technologist: Sharion Dove RVS  Examination Guidelines: A complete evaluation includes B-mode imaging, spectral Doppler, color Doppler, and power Doppler as needed of all accessible portions of each vessel. Bilateral testing is considered an integral part of a complete examination. Limited examinations for reoccurring indications may be performed as noted. The reflux portion of the exam is performed with the patient in reverse Trendelenburg.  +---------+---------------+---------+-----------+----------+--------------+ RIGHT    CompressibilityPhasicitySpontaneityPropertiesThrombus Aging +---------+---------------+---------+-----------+----------+--------------+ CFV      Full           Yes      Yes                                 +---------+---------------+---------+-----------+----------+--------------+ SFJ  Full                                                        +---------+---------------+---------+-----------+----------+--------------+ FV Prox  Full                                                        +---------+---------------+---------+-----------+----------+--------------+ FV Mid   Full                                                        +---------+---------------+---------+-----------+----------+--------------+ FV DistalFull                                                        +---------+---------------+---------+-----------+----------+--------------+ PFV      Full                                                         +---------+---------------+---------+-----------+----------+--------------+ POP      Full           Yes      Yes                                 +---------+---------------+---------+-----------+----------+--------------+ PTV      Full                                                        +---------+---------------+---------+-----------+----------+--------------+ PERO     Full                                                        +---------+---------------+---------+-----------+----------+--------------+   +---------+---------------+---------+-----------+----------+--------------+ LEFT     CompressibilityPhasicitySpontaneityPropertiesThrombus Aging +---------+---------------+---------+-----------+----------+--------------+ CFV      Full           Yes      Yes                                 +---------+---------------+---------+-----------+----------+--------------+ SFJ      Full                                                        +---------+---------------+---------+-----------+----------+--------------+  FV Prox  Full                                                        +---------+---------------+---------+-----------+----------+--------------+ FV Mid   Full                                                        +---------+---------------+---------+-----------+----------+--------------+ FV DistalFull                                                        +---------+---------------+---------+-----------+----------+--------------+ PFV      Full                                                        +---------+---------------+---------+-----------+----------+--------------+ POP      Full           Yes      Yes                                 +---------+---------------+---------+-----------+----------+--------------+ PTV      Full                                                         +---------+---------------+---------+-----------+----------+--------------+ PERO     Full                                                        +---------+---------------+---------+-----------+----------+--------------+     Summary: BILATERAL: - No evidence of deep vein thrombosis seen in the lower extremities, bilaterally. -No evidence of popliteal cyst, bilaterally.   *See table(s) above for measurements and observations. Electronically signed by Jamelle Haring on 03/07/2020 at 5:50:28 PM.    Final    VAS [place] UPPER EXT VEIN MAPPING (PRE-OP AVF)  Result Date: 03/10/2020 UPPER EXTREMITY VEIN MAPPING  Indications: Pre-access. Comparison Study: no prior Performing Technologist: Abram Sander RVS  Examination Guidelines: A complete evaluation includes B-mode imaging, spectral Doppler, color Doppler, and power Doppler as needed of all accessible portions of each vessel. Bilateral testing is considered an integral part of a complete examination. Limited examinations for reoccurring indications may be performed as noted. +-----------------+-------------+----------+--------------+ Right Cephalic   Diameter (cm)Depth (cm)   Findings    +-----------------+-------------+----------+--------------+ Shoulder             0.22        0.93                  +-----------------+-------------+----------+--------------+  Prox upper arm       0.19        0.57                  +-----------------+-------------+----------+--------------+ Mid upper arm        0.18        0.63                  +-----------------+-------------+----------+--------------+ Dist upper arm       0.28        0.52                  +-----------------+-------------+----------+--------------+ Antecubital fossa    0.27        0.34                  +-----------------+-------------+----------+--------------+ Prox forearm         0.25        0.47     branching    +-----------------+-------------+----------+--------------+ Mid forearm                              not visualized +-----------------+-------------+----------+--------------+ Dist forearm                            not visualized +-----------------+-------------+----------+--------------+ Wrist                                   not visualized +-----------------+-------------+----------+--------------+ +-----------------+-------------+----------+---------+ Right Basilic    Diameter (cm)Depth (cm)Findings  +-----------------+-------------+----------+---------+ Prox upper arm       0.30        1.04             +-----------------+-------------+----------+---------+ Mid upper arm        0.26        1.35             +-----------------+-------------+----------+---------+ Dist upper arm       0.28        1.28             +-----------------+-------------+----------+---------+ Antecubital fossa    0.20        0.74   branching +-----------------+-------------+----------+---------+ Prox forearm         0.13        0.24             +-----------------+-------------+----------+---------+ Mid forearm          0.12        0.22             +-----------------+-------------+----------+---------+ Distal forearm       0.13        0.27             +-----------------+-------------+----------+---------+ +-----------------+-------------+----------+---------+ Left Cephalic    Diameter (cm)Depth (cm)Findings  +-----------------+-------------+----------+---------+ Shoulder             0.21        0.80             +-----------------+-------------+----------+---------+ Prox upper arm       0.21        0.66             +-----------------+-------------+----------+---------+ Mid upper arm        0.24        0.58             +-----------------+-------------+----------+---------+  Dist upper arm       0.23        0.51             +-----------------+-------------+----------+---------+ Antecubital fossa    0.40        0.21   branching  +-----------------+-------------+----------+---------+ Prox forearm         0.22        0.34             +-----------------+-------------+----------+---------+ Mid forearm          0.27        0.46             +-----------------+-------------+----------+---------+ Dist forearm         0.25        0.47             +-----------------+-------------+----------+---------+ Wrist                0.23        0.29             +-----------------+-------------+----------+---------+ +-----------------+-------------+----------+--------------+ Left Basilic     Diameter (cm)Depth (cm)   Findings    +-----------------+-------------+----------+--------------+ Prox upper arm       0.24        1.03                  +-----------------+-------------+----------+--------------+ Mid upper arm        0.22        1.75                  +-----------------+-------------+----------+--------------+ Dist upper arm       0.20        1.37                  +-----------------+-------------+----------+--------------+ Antecubital fossa    0.22        0.83                  +-----------------+-------------+----------+--------------+ Prox forearm         0.16        0.60     branching    +-----------------+-------------+----------+--------------+ Mid forearm                             not visualized +-----------------+-------------+----------+--------------+ Distal forearm                          not visualized +-----------------+-------------+----------+--------------+ Elbow                                   not visualized +-----------------+-------------+----------+--------------+ *See table(s) above for measurements and observations.  Diagnosing physician: Deitra Mayo MD Electronically signed by Deitra Mayo MD on 03/10/2020 at 5:00:01 PM.    Final    VAS [place] UPPER EXTREMITY VENOUS DUPLEX  Result Date: 03/07/2020 UPPER VENOUS STUDY  Indications: fever of unknown origin  Limitations: Bandage at right neck. Comparison Study: No prior study Performing Technologist: Sharion Dove RVS  Examination Guidelines: A complete evaluation includes B-mode imaging, spectral Doppler, color Doppler, and power Doppler as needed of all accessible portions of each vessel. Bilateral testing is considered an integral part of a complete examination. Limited examinations for reoccurring indications may be performed as noted.  Right Findings: +----------+------------+---------+-----------+----------+-------+ RIGHT     CompressiblePhasicitySpontaneousPropertiesSummary +----------+------------+---------+-----------+----------+-------+ IJV  Yes       Yes                      +----------+------------+---------+-----------+----------+-------+ Subclavian               Yes       Yes                      +----------+------------+---------+-----------+----------+-------+ Axillary                 Yes       Yes                      +----------+------------+---------+-----------+----------+-------+ Brachial      Full       Yes       Yes                      +----------+------------+---------+-----------+----------+-------+ Radial        Full                                          +----------+------------+---------+-----------+----------+-------+ Ulnar         Full                                          +----------+------------+---------+-----------+----------+-------+ Cephalic      Full                                          +----------+------------+---------+-----------+----------+-------+ Basilic       Full                                          +----------+------------+---------+-----------+----------+-------+  Left Findings: +----------+------------+---------+-----------+----------+-------+ LEFT      CompressiblePhasicitySpontaneousPropertiesSummary +----------+------------+---------+-----------+----------+-------+  IJV           Full       Yes       Yes                      +----------+------------+---------+-----------+----------+-------+ Subclavian               Yes       Yes                      +----------+------------+---------+-----------+----------+-------+ Axillary                 Yes       Yes                      +----------+------------+---------+-----------+----------+-------+ Brachial      Full       Yes       Yes                      +----------+------------+---------+-----------+----------+-------+ Radial        Full                                          +----------+------------+---------+-----------+----------+-------+  Ulnar         Full                                          +----------+------------+---------+-----------+----------+-------+ Cephalic      Full                                          +----------+------------+---------+-----------+----------+-------+  Summary:  Right: No evidence of deep vein thrombosis in the upper extremity. No evidence of superficial vein thrombosis in the upper extremity.  Left: No evidence of deep vein thrombosis in the upper extremity. No evidence of superficial vein thrombosis in the upper extremity.  *See table(s) above for measurements and observations.  Diagnosing physician: Jamelle Haring Electronically signed by Jamelle Haring on 03/07/2020 at 5:50:10 PM.    Final      Antimicrobials:   NONE CURRENTLY   Subjective: DID WELL OVERNIGHT, RESTING IN BED COMFORTABLE  Objective: Vitals:   03/20/20 0551 03/20/20 1606 03/20/20 2049 03/21/20 0607  BP: 125/70 (!) 142/75 112/69 110/63  Pulse: 86 83 97 98  Resp:  $Remo'16 17 20  'wKfPN$ Temp: 98.9 F (37.2 C) 98.3 F (36.8 C) 98.7 F (37.1 C) 98 F (36.7 C)  TempSrc: Oral Oral Oral Oral  SpO2: 98% 100% 95% 95%  Weight:      Height:       No intake or output data in the 24 hours ending 03/21/20 1123 Filed Weights   03/19/20 0500 03/19/20 0725 03/19/20 1124   Weight: 68.7 kg 66.3 kg 63.7 kg    Examination:  General exam: Appears calm and comfortable , HD cath present  Respiratory system: Clear to auscultation. Respiratory effort normal. Cardiovascular system: S1 & S2 heard, RRR. No JVD, murmurs, rubs, gallops or clicks. No pedal edema. Gastrointestinal system: Abdomen is nondistended, soft and nontender. No organomegaly or masses felt. Normal bowel sounds heard. Central nervous system: Alert and oriented. No focal neurological deficits. Extremities: wwp, healing fistual on ue Skin: No rashes, lesions or ulcers Psychiatry: Judgement and insight appear normal. Mood & affect appropriate.     Data Reviewed: I have personally reviewed following labs and imaging studies  CBC: Recent Labs  Lab 03/16/20 0248 03/17/20 1527 03/18/20 0318 03/19/20 0348 03/21/20 0138  WBC 8.4 10.6* 7.6 7.2 8.7  HGB 7.6* 7.2* 7.3* 7.0* 7.1*  HCT 23.3* 22.3* 22.3* 20.9* 21.0*  MCV 90.7 91.4 89.6 89.7 89.4  PLT 276 303 285 282 983   Basic Metabolic Panel: Recent Labs  Lab 03/15/20 0419 03/16/20 0248 03/17/20 1526 03/18/20 0318 03/19/20 0348  NA 133* 133* 131* 136 134*  K 3.8 3.7 4.1 3.9 3.7  CL 98 97* 95* 101 99  CO2 $Re'25 24 23 23 24  'fZk$ GLUCOSE 81 83 124* 92 93  BUN 19 30* 50* 22 38*  CREATININE 5.37* 7.35* 10.40* 5.84* 7.92*  CALCIUM 7.8* 7.8* 7.7* 8.2* 7.9*  MG 1.9 1.9  --  1.9 1.9  PHOS 4.0  --  5.7* 3.8  --    GFR: Estimated Creatinine Clearance: 6.5 mL/min (A) (by C-G formula based on SCr of 7.92 mg/dL (H)). Liver Function Tests: Recent Labs  Lab 03/17/20 1526 03/18/20 0318  AST  --  42*  ALT  --  29  ALKPHOS  --  42  BILITOT  --  0.7  PROT  --  6.6  ALBUMIN 1.9* 2.0*   No results for input(s): LIPASE, AMYLASE in the last 168 hours. No results for input(s): AMMONIA in the last 168 hours. Coagulation Profile: No results for input(s): INR, PROTIME in the last 168 hours. Cardiac Enzymes: Recent Labs  Lab 03/16/20 1139  CKTOTAL 81    BNP (last 3 results) No results for input(s): PROBNP in the last 8760 hours. HbA1C: No results for input(s): HGBA1C in the last 72 hours. CBG: Recent Labs  Lab 03/20/20 0816 03/20/20 1205 03/20/20 1605 03/20/20 2048 03/21/20 0733  GLUCAP 91 106* 146* 119* 88   Lipid Profile: No results for input(s): CHOL, HDL, LDLCALC, TRIG, CHOLHDL, LDLDIRECT in the last 72 hours. Thyroid Function Tests: No results for input(s): TSH, T4TOTAL, FREET4, T3FREE, THYROIDAB in the last 72 hours. Anemia Panel: No results for input(s): VITAMINB12, FOLATE, FERRITIN, TIBC, IRON, RETICCTPCT in the last 72 hours. Sepsis Labs: No results for input(s): PROCALCITON, LATICACIDVEN in the last 168 hours.  Recent Results (from the past 240 hour(s))  Culture, blood (routine x 2)     Status: None   Collection Time: 03/13/20  9:35 PM   Specimen: BLOOD  Result Value Ref Range Status   Specimen Description BLOOD RIGHT ARM  Final   Special Requests   Final    BOTTLES DRAWN AEROBIC AND ANAEROBIC Blood Culture adequate volume   Culture   Final    NO GROWTH 5 DAYS Performed at [facility], [address] [place], [place] [address]    Report Status 03/18/2020 FINAL  Final  Culture, blood (routine x 2)     Status: None   Collection Time: 03/13/20  9:41 PM   Specimen: BLOOD  Result Value Ref Range Status   Specimen Description BLOOD RIGHT HAND  Final   Special Requests   Final    BOTTLES DRAWN AEROBIC AND ANAEROBIC Blood Culture adequate volume   Culture   Final    NO GROWTH 5 DAYS Performed at [facility], [place] [address] [place], [place] [address]    Report Status 03/18/2020 FINAL  Final  Culture, Urine     Status: None   Collection Time: 03/14/20  3:00 AM   Specimen: Urine, Random  Result Value Ref Range Status   Specimen Description URINE, RANDOM  Final   Special Requests NONE  Final   Culture   Final    NO GROWTH Performed at [facility], [place] [address], [place], [place]  [address]    Report Status 03/15/2020 FINAL  Final         Radiology Studies: No results found.      Scheduled Meds: . chlorhexidine gluconate (MEDLINE KIT)  15 mL Mouth Rinse BID  . Chlorhexidine Gluconate Cloth  6 each Topical Q0600  . Chlorhexidine Gluconate Cloth  6 each Topical Q0600  . darbepoetin (ARANESP) injection - DIALYSIS  60 mcg Intravenous Q Tue-HD  . dextromethorphan  30 mg Oral BID  . docusate sodium  100 mg Oral BID  . feeding supplement  237 mL Oral TID BM  . levETIRAcetam  250 mg Oral Q T,Th,Sa-HD  . levETIRAcetam  500 mg Oral BID  . mouth rinse  15 mL Mouth Rinse BID  . metoprolol tartrate  75 mg Oral BID  . multivitamin  1 tablet Oral QHS  . NIFEdipine  90 mg Oral Daily  . [START ON 03/22/2020] predniSONE  30  mg Oral Q breakfast   Followed by  . [START ON 03/27/2020] predniSONE  20 mg Oral Q breakfast   Followed by  . [START ON 04/01/2020] predniSONE  10 mg Oral Q breakfast   Continuous Infusions: . sodium chloride 10 mL/hr at 03/14/20 1441  . ceFEPime (MAXIPIME) IV 2 g (03/19/20 1218)     LOS: 25 days    Time spent: 43 min    Nicolette Bang, MD [facility]  If 7PM-7AM, please contact night-coverage  03/21/2020, 11:23 AM

## 2020-03-21 NOTE — Progress Notes (Signed)
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  Continue dialysis on TTS schedule with process underway for outpatient dialysis unit placement (this has been difficult due to the unique insurance coverage that the patient has).  She had creation of a left brachiocephalic fistula by Dr. Carlis Abbott on 3/11 and is currently getting hemodialysis via a right IJ tunneled hemodialysis catheter. Plan for next HD today.  Awaiting outpatient HD center.  # Suspected healthcare associated pneumonia: On intravenous cefepime and vancomycin and remains intermittently febrile.  She has fever of unknown origin that has been worked up with imaging/cultures.  Yesterday, started her on prednisone for suspected autoimmune mechanism associated fevers, weaning gradually.  # Anemia: Likely from underlying chronic disease and surgical losses.  Ferritin level very high. Continue Aranesp and transfuse as needed.   # Hypertension: Blood pressure acceptable. LE  edema manage with dialysis./Ultrafiltration.  # Secondary hyperparathyroidism: Calcium and phosphorus under acceptable control, not on binders at this time.  PTH level was 193 on 2/23-at goal for ESRD.  # Hyponatremia: Managed with dialysis, continue fluid restriction.  Subjective: Seen and examined at bedside.  No new event.  Plan for dialysis today.  Feeling much better.  Denies nausea vomiting chest pain shortness of breath. Objective Vital signs in last 24 hours: Vitals:   03/20/20 0551 03/20/20 1606 03/20/20 2049 03/21/20 0607  BP: 125/70 (!) 142/75 112/69 110/63  Pulse: 86 83 97 98  Resp:  16 17 20   Temp: 98.9 F (37.2 C) 98.3 F (36.8 C) 98.7 F (37.1 C) 98 F (36.7 C)  TempSrc: Oral Oral Oral Oral  SpO2: 98% 100% 95% 95%   Weight:      Height:       Weight change:  No intake or output data in the 24 hours ending 03/21/20 0924     Labs: Basic Metabolic Panel: Recent Labs  Lab 03/15/20 0419 03/16/20 0248 03/17/20 1526 03/18/20 0318 03/19/20 0348  NA 133*   < > 131* 136 134*  K 3.8   < > 4.1 3.9 3.7  CL 98   < > 95* 101 99  CO2 25   < > 23 23 24   GLUCOSE 81   < > 124* 92 93  BUN 19   < > 50* 22 38*  CREATININE 5.37*   < > 10.40* 5.84* 7.92*  CALCIUM 7.8*   < > 7.7* 8.2* 7.9*  PHOS 4.0  --  5.7* 3.8  --    < > = values in this interval not displayed.   Liver Function Tests: Recent Labs  Lab 03/17/20 1526 03/18/20 0318  AST  --  42*  ALT  --  29  ALKPHOS  --  42  BILITOT  --  0.7  PROT  --  6.6  ALBUMIN 1.9* 2.0*   No results for input(s): LIPASE, AMYLASE in the last 168 hours. No results for input(s): AMMONIA in the last 168 hours. CBC: Recent Labs  Lab 03/16/20 0248 03/17/20 1527 03/18/20 0318 03/19/20 0348 03/21/20 0138  WBC 8.4 10.6* 7.6 7.2 8.7  HGB 7.6* 7.2* 7.3* 7.0* 7.1*  HCT 23.3* 22.3* 22.3* 20.9* 21.0*  MCV 90.7 91.4 89.6 89.7 89.4  PLT 276 303 285 282 267   Cardiac Enzymes: Recent Labs  Lab 03/16/20 1139  CKTOTAL 81   CBG:  Recent Labs  Lab 03/20/20 0816 03/20/20 1205 03/20/20 1605 03/20/20 2048 03/21/20 0733  GLUCAP 91 106* 146* 119* 88    Iron Studies: No results for input(s): IRON, TIBC, TRANSFERRIN, FERRITIN in the last 72 hours. Studies/Results: No results found.  Medications: Infusions: . sodium chloride 10 mL/hr at 03/14/20 1441  . ceFEPime (MAXIPIME) IV 2 g (03/19/20 1218)    Scheduled Medications: . chlorhexidine gluconate (MEDLINE KIT)  15 mL Mouth Rinse BID  . Chlorhexidine Gluconate Cloth  6 each Topical Q0600  . Chlorhexidine Gluconate Cloth  6 each Topical Q0600  . darbepoetin (ARANESP) injection - DIALYSIS  60 mcg Intravenous Q Tue-HD  . dextromethorphan  30 mg Oral BID  . docusate sodium  100 mg Oral BID  . feeding  supplement  237 mL Oral TID BM  . levETIRAcetam  250 mg Oral Q T,Th,Sa-HD  . levETIRAcetam  500 mg Oral BID  . mouth rinse  15 mL Mouth Rinse BID  . metoprolol tartrate  75 mg Oral BID  . multivitamin  1 tablet Oral QHS  . NIFEdipine  90 mg Oral Daily  . [START ON 03/22/2020] predniSONE  30 mg Oral Q breakfast   Followed by  . [START ON 03/27/2020] predniSONE  20 mg Oral Q breakfast   Followed by  . [START ON 04/01/2020] predniSONE  10 mg Oral Q breakfast    have reviewed scheduled and prn medications.  Physical Exam: General:NAD, lying on bed comfortable Heart:RRR, s1s2 nl Lungs:clear b/l, no crackle Abdomen:soft, Non-tender, non-distended Extremities: Bilateral LE edema present+ Dialysis Access: Right IJ TDC, site clean.  Barbara Crawford Barbara Crawford Barbara Crawford 03/21/2020,9:24 AM  LOS: 25 days

## 2020-03-22 DIAGNOSIS — N179 Acute kidney failure, unspecified: Secondary | ICD-10-CM | POA: Diagnosis not present

## 2020-03-22 DIAGNOSIS — I469 Cardiac arrest, cause unspecified: Secondary | ICD-10-CM | POA: Diagnosis not present

## 2020-03-22 DIAGNOSIS — D62 Acute posthemorrhagic anemia: Secondary | ICD-10-CM | POA: Diagnosis not present

## 2020-03-22 DIAGNOSIS — R509 Fever, unspecified: Secondary | ICD-10-CM | POA: Diagnosis not present

## 2020-03-22 LAB — GLUCOSE, CAPILLARY
Glucose-Capillary: 105 mg/dL — ABNORMAL HIGH (ref 70–99)
Glucose-Capillary: 110 mg/dL — ABNORMAL HIGH (ref 70–99)
Glucose-Capillary: 85 mg/dL (ref 70–99)
Glucose-Capillary: 90 mg/dL (ref 70–99)
Glucose-Capillary: 90 mg/dL (ref 70–99)

## 2020-03-22 NOTE — Progress Notes (Signed)
PROGRESS NOTE    Barbara Crawford  XBD:[id number] DOB: Sep 18, 1955 DOA: 02/25/2020 PCP: Vonna Drafts, FNP   Brief Narrative:  [name] is a 65 yo female with past medical history of hypertension, prior Covid (Dec 2021) presented to the hospital with complaints of lower extremity swelling, back pain, generalized malaise, fatigue at home. In the ED,she was found to have a hemoglobin of 6.3, potassium 5.4, sodium 130, bicarb 9, BUN 144, and creatinine 29.98. CT of the abdomen and pelvis was negative for obstruction or acute abnormality. Temporary hemodialysis catheter was placed by IR and subsequently, the patient had a PEA arrest associated with seizure-like activity. She was intubated and subsequently extubated 02/28/2020. 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 Renal biopsy done 03/03/2019. ANA positive. ID followed the patient for fever.Trialysis cath was removed on 03/06/20 and subsequently underwent permacath placement.  During hospitalization, patient continued to have fever so ID wasreconsulted.Extensive work-up was done in the past for fever. Recent x-ray showed consolidation in the lungs. Patient was started on vancomycin and cefepime for possible healthcare associated pneumonia and has completed the course of antibiotic.The thought process was that there is possibility of autoimmune disease causing kidney disease and fever. Prednisone wasinitiated improvement in fever. Currently awaiting for outpatient hemodialysis arrangement.   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 Developed a perinephric hematoma post biopsy. CT scan of the abdomen pelvis from 03/16/2020 showed evolving left perinephric hematoma without acute hemorrhagic component. Status post IR guided internal jugular tunneled hemodialysis catheter on 03/09/2020. Awaiting for outpatient hemodialysis arrangement.  Cough, shortness of breath, dyspnea. Much improved. Completedvancomycin and cefepime course.T-max of 98.40F. Urine and blood cultures werenegative. Duplex ultrasound was negative for DVT. Continue incentive spirometry.   Fever. T-max of 98.5. Possible autoimmune etiology. Continue prednisone. We will continue to monitor fever trend. CT scan of the abdomen pelvis with evolving left perinephric hematoma. CT scan of the chest was performed which showed mediastinal and axillary lymph nodes with decreasing bilateral pleural effusion.  Pleural effusion Continue hemodialysis for volume management.   Acute blood loss anemia Stable at this time. Continue to monitor hemoglobin. 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 Nephrology on board. Continue prednisone as per nephrology,plan to taper over 3 to 4 weeks  Seizure  Patient had seizure with lossof pulse and PEA arrest s/pCPR. Was seen by neurology. Plan is to continue Keppra on discharge  Perinephric hematoma Seen on CT abdomen/pelvis on 03/03/2020. Continue to monitor closely. Latest hemoglobin of 7.3 Latest CT scan showing evolving hematoma.Check CBC in a.m.  Essential hypertension Continue with nifedipine, metoprolol and labetalol as needed. Improved blood pressure on metoprolol 75 mg twice daily. Continue the same.  Uremia Resolved.    Normocytic anemia Status post kidney biopsy and perinephric hematoma. Status post 2 units of packed RBC on 03/03/2020. Continue Aranesp as per  nephrology. Recent hemoglobin of 7.3-stable  Acute on chronic respiratory failure with hypoxia Resolved. Status post PEA arrest. Extubated on 02/28/2020. Currently on room air  Cardiac arrest with pulseless electrical activity  Status post CPR and intubation. Stable at this time.  Debility, deconditioning,Will need home PT on discharge  DVT prophylaxis: SCD/Compression stockings  Code Status: Full    Code Status Orders  (From admission, onward)         Start     Ordered   02/25/20 2009  Full code  Continuous        02/25/20 2010        Code Status History    This patient has a current code status but no historical code status.   Advance Care Planning Activity     Family Communication: NONE TODAY  Disposition Plan:    Status is: Inpatient  Remains inpatient appropriate because:, IV treatments appropriate due to intensity of illness or inability to take PO and Inpatient level of care appropriate due to severity of illness, will need hemodialysis set up as outpatient  Dispo: Patient From: Home Planned Disposition: Home with Health Care Svc medically stable for discharge: Yes,improving fever sostable for disposition. Consults called: None Admission status: Inpatient   Consultants:   NEPH, ID  Procedures:  CT ABDOMEN PELVIS WO CONTRAST  Result Date: 03/03/2020 CLINICAL DATA:  Anemia, history of recent left renal biopsy EXAM: CT ABDOMEN AND PELVIS WITHOUT CONTRAST TECHNIQUE: Multidetector CT imaging of the abdomen and pelvis was performed following the standard protocol without IV contrast. COMPARISON:  02/25/2020 FINDINGS: Lower chest: New bilateral pleural effusions and bibasilar atelectasis are noted when compared with the prior exam. No pneumothorax is seen. Cardiac  blood pool is decreased in attenuation consistent with the underlying history of anemia. Hepatobiliary: No focal liver abnormality is seen. Status post cholecystectomy. No biliary dilatation. Pancreas: Unremarkable. No pancreatic ductal dilatation or surrounding inflammatory changes. Spleen: Normal in size without focal abnormality. Adrenals/Urinary Tract: Adrenal glands are within normal limits. Right kidney demonstrates a nonobstructing stone in the midportion measuring 8 mm. Left kidney demonstrates no obstructive change although hyperdense material is noted surrounding the lower pole of the left kidney and extending superiorly consistent with perinephric hemorrhage related to the recent biopsy. No significant impingement upon the native kidney is noted. The bladder is decompressed. Stomach/Bowel: The appendix is not well visualized. No inflammatory changes to suggest appendicitis are noted. Colon shows no obstructive or inflammatory changes. Small bowel and stomach appear within normal limits. Vascular/Lymphatic: Aortic atherosclerosis. No enlarged abdominal or pelvic lymph nodes. Reproductive: Multiple calcified uterine fibroids are noted. Other: Minimal free fluid is noted in the pelvis. Changes of anasarca are noted. Musculoskeletal: Lumbar spine degenerative changes are noted without acute abnormality. IMPRESSION: Changes consistent with the recent renal biopsy with perinephric hematoma identified. No significant impingement upon the underlying native kidney is noted. No obstructive changes are seen. Nonobstructing midpole right renal stone stable from the prior exam. New bilateral effusions and lower lobe atelectasis. Mild changes of anasarca Electronically Signed   By: Inez Catalina M.D.   On: 03/03/2020 10:50   DG Chest 2 View  Result Date: 03/01/2020 CLINICAL DATA:  Pneumonia. EXAM: CHEST - 2 VIEW COMPARISON:  02/29/2020 FINDINGS: Right jugular central venous catheter remains in appropriate position.  Stable mild cardiomegaly. Diffuse interstitial infiltrates show no significant change. Persistent atelectasis or consolidation is seen in the left lower lobe. Probable small left pleural effusion again noted. IMPRESSION: Stable mild cardiomegaly and diffuse  interstitial infiltrates. Stable left lower lobe atelectasis versus consolidation, and probable small left pleural effusion. Electronically Signed   By: Marlaine Hind M.D.   On: 03/01/2020 15:47   DG Abd 1 View  Result Date: 02/26/2020 CLINICAL DATA:  OG tube placement EXAM: ABDOMEN - 1 VIEW COMPARISON:  CT 02/25/2020 FINDINGS: Esophageal tube tip and side port overlie the proximal to mid gastric region. Gas pattern is nonobstructed. Clips in the right upper quadrant IMPRESSION: Esophageal tube tip overlies the proximal to mid gastric region. Electronically Signed   By: Donavan Foil M.D.   On: 02/26/2020 21:10   CT HEAD WO CONTRAST  Result Date: 02/27/2020 CLINICAL DATA:  65 year old female status post seizure. Status post COVID-19 in December. Admitted with acute kidney failure. EXAM: CT HEAD WITHOUT CONTRAST TECHNIQUE: Contiguous axial images were obtained from the base of the skull through the vertex without intravenous contrast. COMPARISON:  None. FINDINGS: Brain: Mild dystrophic calcifications at the bilateral basal ganglia. Cerebral volume is within normal limits for age. No midline shift, ventriculomegaly, mass effect, evidence of mass lesion, intracranial hemorrhage or evidence of cortically based acute infarction. Minor subcortical white matter hypodensity in the anterior left frontal lobe. Elsewhere gray-white matter differentiation appears normal. Vascular: Mild Calcified atherosclerosis at the skull base. No suspicious intracranial vascular hyperdensity. There is a degree of generalized intracranial artery tortuosity. The right vertebral artery appears dominant. Skull: Negative. Sinuses/Orbits: Visualized paranasal sinuses and mastoids are  clear. Other: There is fluid layering in the nasopharynx. Unclear whether the patient is intubated on the scout view. No acute orbit or scalp soft tissue finding. IMPRESSION: 1. No acute intracranial abnormality. Intracranial artery tortuosity. Mild for age nonspecific white matter changes. 2. Fluid layering in the nasopharynx. Query if the patient is intubated. Electronically Signed   By: Genevie Ann M.D.   On: 02/27/2020 04:43   CT CHEST WO CONTRAST  Result Date: 03/17/2020 CLINICAL DATA:  Sepsis, cough, post CPR EXAM: CT CHEST WITHOUT CONTRAST TECHNIQUE: Multidetector CT imaging of the chest was performed following the standard protocol without IV contrast. COMPARISON:  03/06/2020 FINDINGS: Cardiovascular: Cardiomegaly. Scattered aortic calcifications. No aneurysm. Mediastinum/Nodes: Small and borderline sized mediastinal lymph nodes are stable since prior study. Bilateral axillary borderline lymph nodes also stable since prior study. Trachea and esophagus are unremarkable. Thyroid unremarkable. Lungs/Pleura: Decreasing bilateral effusions with residual small left effusion and trace right effusion. Compressive atelectasis versus pneumonia in the left lower lobe. Upper Abdomen: Imaging into the upper abdomen demonstrates no acute findings. Musculoskeletal: Chest wall soft tissues are unremarkable. No acute bony abnormality. IMPRESSION: Stable cardiomegaly. Stable shoddy mediastinal and bilateral axillary lymph nodes. Decreasing bilateral effusions with small residual left pleural effusion and trace right pleural effusion. Compressive atelectasis versus pneumonia in the left lower lobe. Aortic Atherosclerosis (ICD10-I70.0). Electronically Signed   By: Rolm Baptise M.D.   On: 03/17/2020 09:56   CT CHEST WO CONTRAST  Result Date: 03/06/2020 CLINICAL DATA:  Fever of unknown origin. Recent COVID infection. Progressive fatigue and back pain. Recent renal biopsy. EXAM: CT CHEST WITHOUT CONTRAST TECHNIQUE:  Multidetector CT imaging of the chest was performed following the standard protocol without IV contrast. COMPARISON:  Lung bases from abdominal CT 03/03/2020. Chest radiograph 03/01/2020 FINDINGS: Cardiovascular: Moderate aortic atherosclerosis. Mild cardiomegaly. Trace pericardial fluid. Decreased density of cardiac blood pool consistent with provided history of anemia. Mediastinum/Nodes: Shotty mediastinal lymph nodes with prominent nodes measuring up to 9 mm short axis. Limited assessment for hilar adenopathy on this noncontrast exam. Small  bilateral axillary lymph nodes not enlarged by size criteria. No thyroid nodule. Decompressed esophagus. Lungs/Pleura: Moderate bilateral pleural effusions. Pleural effusion on the right is not significantly changed, pleural effusion on the left has increased in size. Associated compressive atelectasis in the left greater than right lower lobe. No other focal airspace disease. No findings of pulmonary edema. Trachea and central bronchi are patent. Upper Abdomen: Left perinephric hematoma, mild improvement. Cholecystectomy. Musculoskeletal: Generalized subcutaneous body wall edema. Minimal thoracic spondylosis with endplate spurring. There are no acute or suspicious osseous abnormalities. IMPRESSION: 1. Moderate bilateral pleural effusions with compressive atelectasis in the left greater than right lower lobe. Pleural effusion on the right is not significantly changed from recent abdominal CT, pleural effusion on the left has increased in size. 2. Shotty mediastinal lymph nodes are likely reactive. 3. Generalized subcutaneous body wall edema. 4. Mild cardiomegaly. 5. Improving left perinephric hematoma. Aortic Atherosclerosis (ICD10-I70.0). Electronically Signed   By: Keith Rake M.D.   On: 03/06/2020 17:26   MR ANGIO HEAD WO CONTRAST  Result Date: 02/27/2020 CLINICAL DATA:  65 year old female status post seizure. Status post COVID-19 in December. Admitted with acute  kidney failure. EXAM: MRI HEAD WITHOUT CONTRAST MRA HEAD WITHOUT CONTRAST TECHNIQUE: Multiplanar, multiecho pulse sequences of the brain and surrounding structures were obtained without intravenous contrast. Angiographic images of the head were obtained using MRA technique without contrast. COMPARISON:  Head CT 0428 hours today. FINDINGS: MRI HEAD FINDINGS Brain: Patchy and indistinct symmetric abnormal cerebral white matter signal on DWI which appears mildly restricted (series 5, image 85 and series 6, image 35). No corresponding white matter T2 or FLAIR hyperintensity, although there is scattered small subcortical white matter T2 and FLAIR signal which appears not directly related. No other restricted diffusion. No midline shift, mass effect, evidence of mass lesion, ventriculomegaly, extra-axial collection or acute intracranial hemorrhage. Cervicomedullary junction and pituitary are within normal limits. No cortical encephalomalacia. No chronic cerebral blood products. Deep gray matter nuclei, brainstem and cerebellum appear normal. Thin slice coronal temporal lobe imaging. Hippocampal formations appear symmetric and within normal limits (series 22, image 11. Other mesial temporal lobe structures appear within normal limits. Vascular: Major intracranial vascular flow voids are preserved. Tortuous intracranial arteries, including dominant right vertebral artery. Skull and upper cervical spine: Partially visible cervical spine degeneration, up to mild C4-C5 degenerative spinal stenosis. Visualized bone marrow signal is within normal limits. Sinuses/Orbits: Negative orbits. Paranasal Visualized paranasal sinuses and mastoids are stable and well pneumatized. Other: Intubated with small volume fluid in the nasopharynx. Visible internal auditory structures appear normal. Scalp soft tissues appear negative. MRA HEAD FINDINGS Antegrade flow in the posterior circulation with tortuous dominant right vertebral artery.  Tortuous basilar artery. Normal PICA origins. No vertebrobasilar stenosis. Patent SCA and PCA origins. Posterior communicating arteries are present, the left is larger. Bilateral PCA branches are normal aside from tortuosity. Antegrade flow in both ICA siphons. No siphon stenosis. Ophthalmic and posterior communicating artery origins are within normal limits. Patent carotid termini. Dominant left ACA A1 segment. Anterior communicating artery is normal. Visible ACA branches are within normal limits. MCA M1 segments and bifurcations are tortuous but patent without stenosis. Visible MCA branches are normal aside from tortuosity. IMPRESSION: 1. Symmetric patchy and indistinct central cerebral white matter diffusion restriction, nonspecific. Perhaps this is sequelae of Uremia or other metabolic derangement. COVID-19 related demyelination is possible, although there is no corresponding T2/FLAIR abnormality. Sequelae of seizure activity was also considered but felt unlikely. 2. Otherwise largely unremarkable for  age noncontrast MRI appearance of the brain, ordinary scattered subcortical white matter signal changes most commonly due to small vessel disease. 3. Intracranial MRA is negative aside from generalized arterial tortuosity. Electronically Signed   By: Genevie Ann M.D.   On: 02/27/2020 06:20   MR BRAIN WO CONTRAST  Result Date: 02/27/2020 CLINICAL DATA:  65 year old female status post seizure. Status post COVID-19 in December. Admitted with acute kidney failure. EXAM: MRI HEAD WITHOUT CONTRAST MRA HEAD WITHOUT CONTRAST TECHNIQUE: Multiplanar, multiecho pulse sequences of the brain and surrounding structures were obtained without intravenous contrast. Angiographic images of the head were obtained using MRA technique without contrast. COMPARISON:  Head CT 0428 hours today. FINDINGS: MRI HEAD FINDINGS Brain: Patchy and indistinct symmetric abnormal cerebral white matter signal on DWI which appears mildly restricted  (series 5, image 85 and series 6, image 35). No corresponding white matter T2 or FLAIR hyperintensity, although there is scattered small subcortical white matter T2 and FLAIR signal which appears not directly related. No other restricted diffusion. No midline shift, mass effect, evidence of mass lesion, ventriculomegaly, extra-axial collection or acute intracranial hemorrhage. Cervicomedullary junction and pituitary are within normal limits. No cortical encephalomalacia. No chronic cerebral blood products. Deep gray matter nuclei, brainstem and cerebellum appear normal. Thin slice coronal temporal lobe imaging. Hippocampal formations appear symmetric and within normal limits (series 22, image 11. Other mesial temporal lobe structures appear within normal limits. Vascular: Major intracranial vascular flow voids are preserved. Tortuous intracranial arteries, including dominant right vertebral artery. Skull and upper cervical spine: Partially visible cervical spine degeneration, up to mild C4-C5 degenerative spinal stenosis. Visualized bone marrow signal is within normal limits. Sinuses/Orbits: Negative orbits. Paranasal Visualized paranasal sinuses and mastoids are stable and well pneumatized. Other: Intubated with small volume fluid in the nasopharynx. Visible internal auditory structures appear normal. Scalp soft tissues appear negative. MRA HEAD FINDINGS Antegrade flow in the posterior circulation with tortuous dominant right vertebral artery. Tortuous basilar artery. Normal PICA origins. No vertebrobasilar stenosis. Patent SCA and PCA origins. Posterior communicating arteries are present, the left is larger. Bilateral PCA branches are normal aside from tortuosity. Antegrade flow in both ICA siphons. No siphon stenosis. Ophthalmic and posterior communicating artery origins are within normal limits. Patent carotid termini. Dominant left ACA A1 segment. Anterior communicating artery is normal. Visible ACA branches  are within normal limits. MCA M1 segments and bifurcations are tortuous but patent without stenosis. Visible MCA branches are normal aside from tortuosity. IMPRESSION: 1. Symmetric patchy and indistinct central cerebral white matter diffusion restriction, nonspecific. Perhaps this is sequelae of Uremia or other metabolic derangement. COVID-19 related demyelination is possible, although there is no corresponding T2/FLAIR abnormality. Sequelae of seizure activity was also considered but felt unlikely. 2. Otherwise largely unremarkable for age noncontrast MRI appearance of the brain, ordinary scattered subcortical white matter signal changes most commonly due to small vessel disease. 3. Intracranial MRA is negative aside from generalized arterial tortuosity. Electronically Signed   By: Genevie Ann M.D.   On: 02/27/2020 06:20   CT ABDOMEN PELVIS W CONTRAST  Result Date: 03/16/2020 CLINICAL DATA:  Abdominal pain and fever. EXAM: CT ABDOMEN AND PELVIS WITH CONTRAST TECHNIQUE: Multidetector CT imaging of the abdomen and pelvis was performed using the standard protocol following bolus administration of intravenous contrast. CONTRAST:  45mL OMNIPAQUE IOHEXOL 300 MG/ML  SOLN COMPARISON:  March 03, 2020 FINDINGS: Lower chest: Mild to moderate severity atelectasis is seen within the left lung base. Mild atelectasis is also noted along  the posterior aspect of the right lung base. These areas are decreased in severity when compared to the prior study. There is a small left pleural effusion. This is mildly decreased in size. Hepatobiliary: No focal liver abnormality is seen. Status post cholecystectomy. No biliary dilatation. Pancreas: Unremarkable. No pancreatic ductal dilatation or surrounding inflammatory changes. Spleen: Normal in size without focal abnormality. Adrenals/Urinary Tract: Adrenal glands are unremarkable. The right kidney is normal in size, without obstructing renal calculi, focal lesion, or hydronephrosis. A 7 mm  nonobstructing renal stone is seen within the right kidney. An evolving left perinephric hematoma is seen. This measures approximately 1.8 cm in maximum thickness. No acute hemorrhagic component is seen. A mild amount of adjacent inflammatory fat stranding is present. The urinary bladder is partially contracted and subsequently limited in evaluation. Stomach/Bowel: Stomach is within normal limits. The appendix is not identified. No evidence of bowel wall thickening, distention, or inflammatory changes. Vascular/Lymphatic: No significant vascular findings are present. No enlarged abdominal or pelvic lymph nodes. Reproductive: Multiple parenchymal calcifications are seen within the lobulated uterus. The bilateral adnexa are unremarkable. Other: No abdominal wall hernia or abnormality. No abdominopelvic ascites. Musculoskeletal: Degenerative changes are seen within the lumbar spine. IMPRESSION: 1. Evolving left perinephric hematoma, without evidence of acute hemorrhagic component. 2. 7 mm nonobstructing renal stone within the right kidney. 3. Bibasilar atelectasis, left greater than right. 4. Small left pleural effusion. 5. Evidence of prior cholecystectomy. Electronically Signed   By: Aram Candela M.D.   On: 03/16/2020 19:44   US RENAL  Result Date: 02/25/2020 CLINICAL DATA:  Acute kidney injury EXAM: RENAL / URINARY TRACT ULTRASOUND COMPLETE COMPARISON:  CT 02/25/2020 FINDINGS: Right Kidney: Renal measurements: 9.7 x 4.2 x 4.7 cm = volume: 99.6 mL. Diffusely increased renal cortical echogenicity. 9 mm shadowing calculus seen in the interpolar right kidney. Corresponds well to the finding on CT. No hydronephrosis or concerning renal mass. Left Kidney: Renal measurements: 11.1 x 5.9 x 4.0 cm = volume: 136 mL. Diffusely increased renal cortical echogenicity. No concerning renal mass, shadowing calculus or hydronephrosis. Bladder: Appears normal for degree of bladder distention. Other: None. IMPRESSION: 1.  Bilaterally increased renal cortical echogenicity compatible with medical renal disease. 2. 9 mm nonobstructive calculus in the interpolar right kidney. Electronically Signed   By: Kreg Shropshire M.D.   On: 02/25/2020 23:18   IR Fluoro Guide CV Line Right  Result Date: 03/09/2020 INDICATION: End-stage renal disease, no current access for dialysis EXAM: ULTRASOUND GUIDANCE FOR VASCULAR ACCESS RIGHT INTERNAL JUGULAR PERMANENT HEMODIALYSIS CATHETER Date:  03/09/2020 03/09/2020 1:54 pm Radiologist:  Judie Petit. Ruel Favors, MD Guidance:  Ultrasound and fluoroscopic FLUOROSCOPY TIME:  Fluoroscopy Time: 0 minutes 36 seconds (2 mGy). MEDICATIONS: 1 g vancomycin within 1 hour of the procedure ANESTHESIA/SEDATION: Versed 1.5 mg IV; Fentanyl 50 mcg IV; Moderate Sedation Time:  16 minutes The patient was continuously monitored during the procedure by the interventional radiology nurse under my direct supervision. CONTRAST:  None. COMPLICATIONS: None immediate. PROCEDURE: Informed consent was obtained from the patient following explanation of the procedure, risks, benefits and alternatives. The patient understands, agrees and consents for the procedure. All questions were addressed. A time out was performed. Maximal barrier sterile technique utilized including caps, mask, sterile gowns, sterile gloves, large sterile drape, hand hygiene, and 2% chlorhexidine scrub. Under sterile conditions and local anesthesia, right internal jugular micropuncture venous access was performed with ultrasound. Images were obtained for documentation of the patent right internal jugular vein. A guide wire was  inserted followed by a transitional dilator. Next, a 0.035 guidewire was advanced into the IVC with a 5-French catheter. Measurements were obtained from the right venotomy site to the proximal right atrium. In the right infraclavicular chest, a subcutaneous tunnel was created under sterile conditions and local anesthesia. 1% lidocaine with epinephrine was  utilized for this. The 19 cm tip to cuff palindrome catheter was tunneled subcutaneously to the venotomy site and inserted into the SVC/RA junction through a valved peel-away sheath. Position was confirmed with fluoroscopy. Images were obtained for documentation. Blood was aspirated from the catheter followed by saline and heparin flushes. The appropriate volume and strength of heparin was instilled in each lumen. Caps were applied. The catheter was secured at the tunnel site with Gelfoam and a pursestring suture. The venotomy site was closed with subcuticular Vicryl suture. Dermabond was applied to the small right neck incision. A dry sterile dressing was applied. The catheter is ready for use. No immediate complications. IMPRESSION: Ultrasound and fluoroscopically guided right internal jugular tunneled hemodialysis catheter (19 cm tip to cuff palindrome catheter). Electronically Signed   By: Jerilynn Mages.  Shick M.D.   On: 03/09/2020 14:03   IR Fluoro Guide CV Line Right  Result Date: 02/26/2020 INDICATION: Acute kidney injury EXAM: Non tunneled temporary hemodialysis catheter placement MEDICATIONS: None ANESTHESIA/SEDATION: None FLUOROSCOPY TIME:  Fluoroscopy Time: 0 minutes 24 seconds (1 mGy). COMPLICATIONS: None immediate. PROCEDURE: Informed written consent was obtained from the patient after a thorough discussion of the procedural risks, benefits and alternatives. All questions were addressed. Maximal Sterile Barrier Technique was utilized including caps, mask, sterile gowns, sterile gloves, sterile drape, hand hygiene and skin antiseptic. A timeout was performed prior to the initiation of the procedure. Right neck prepped and draped in the usual sterile fashion. All elements of maximal sterile barrier were utilized including, cap, mask, sterile gown, sterile gloves, large sterile drape, hand scrubbing and 2% Chlorhexidine for skin cleaning. The right internal jugular vein was evaluated with ultrasound and shown to  be patent. A permanent ultrasound image was obtained and placed in the patient's medical record. Using sterile gel and a sterile probe cover, the right internal jugular vein was entered with a 21 ga needle during real time ultrasound guidance. 0.018 inch guidewire placed and 21 ga needle exchanged for transitional dilator set. Utilizing fluoroscopy, 0.035 inch guidewire advanced through the needle without difficulty. Serial dilation performed, and catheter inserted over the guidewire. The tip was positioned in the right atrium. All lumens of the catheter aspirated and flushed well. The dialysis lumens were locked with Heparin. The catheter was secured to the skin with suture. The insertion site was covered with a Biopatch and sterile dressing. IMPRESSION: Right IJ temporary non tunneled hemodialysis catheter ready for use. Electronically Signed   By: Miachel Roux M.D.   On: 02/26/2020 13:50   IR US Guide Vasc Access Right  Result Date: 03/09/2020 INDICATION: End-stage renal disease, no current access for dialysis EXAM: ULTRASOUND GUIDANCE FOR VASCULAR ACCESS RIGHT INTERNAL JUGULAR PERMANENT HEMODIALYSIS CATHETER Date:  03/09/2020 03/09/2020 1:54 pm Radiologist:  Jerilynn Mages. Daryll Brod, MD Guidance:  Ultrasound and fluoroscopic FLUOROSCOPY TIME:  Fluoroscopy Time: 0 minutes 36 seconds (2 mGy). MEDICATIONS: 1 g vancomycin within 1 hour of the procedure ANESTHESIA/SEDATION: Versed 1.5 mg IV; Fentanyl 50 mcg IV; Moderate Sedation Time:  16 minutes The patient was continuously monitored during the procedure by the interventional radiology nurse under my direct supervision. CONTRAST:  None. COMPLICATIONS: None immediate. PROCEDURE: Informed consent was obtained  from the patient following explanation of the procedure, risks, benefits and alternatives. The patient understands, agrees and consents for the procedure. All questions were addressed. A time out was performed. Maximal barrier sterile technique utilized including caps,  mask, sterile gowns, sterile gloves, large sterile drape, hand hygiene, and 2% chlorhexidine scrub. Under sterile conditions and local anesthesia, right internal jugular micropuncture venous access was performed with ultrasound. Images were obtained for documentation of the patent right internal jugular vein. A guide wire was inserted followed by a transitional dilator. Next, a 0.035 guidewire was advanced into the IVC with a 5-French catheter. Measurements were obtained from the right venotomy site to the proximal right atrium. In the right infraclavicular chest, a subcutaneous tunnel was created under sterile conditions and local anesthesia. 1% lidocaine with epinephrine was utilized for this. The 19 cm tip to cuff palindrome catheter was tunneled subcutaneously to the venotomy site and inserted into the SVC/RA junction through a valved peel-away sheath. Position was confirmed with fluoroscopy. Images were obtained for documentation. Blood was aspirated from the catheter followed by saline and heparin flushes. The appropriate volume and strength of heparin was instilled in each lumen. Caps were applied. The catheter was secured at the tunnel site with Gelfoam and a pursestring suture. The venotomy site was closed with subcuticular Vicryl suture. Dermabond was applied to the small right neck incision. A dry sterile dressing was applied. The catheter is ready for use. No immediate complications. IMPRESSION: Ultrasound and fluoroscopically guided right internal jugular tunneled hemodialysis catheter (19 cm tip to cuff palindrome catheter). Electronically Signed   By: Jerilynn Mages.  Shick M.D.   On: 03/09/2020 14:03   IR US Guide Vasc Access Right  Result Date: 02/26/2020 INDICATION: Acute kidney injury EXAM: Non tunneled temporary hemodialysis catheter placement MEDICATIONS: None ANESTHESIA/SEDATION: None FLUOROSCOPY TIME:  Fluoroscopy Time: 0 minutes 24 seconds (1 mGy). COMPLICATIONS: None immediate. PROCEDURE: Informed  written consent was obtained from the patient after a thorough discussion of the procedural risks, benefits and alternatives. All questions were addressed. Maximal Sterile Barrier Technique was utilized including caps, mask, sterile gowns, sterile gloves, sterile drape, hand hygiene and skin antiseptic. A timeout was performed prior to the initiation of the procedure. Right neck prepped and draped in the usual sterile fashion. All elements of maximal sterile barrier were utilized including, cap, mask, sterile gown, sterile gloves, large sterile drape, hand scrubbing and 2% Chlorhexidine for skin cleaning. The right internal jugular vein was evaluated with ultrasound and shown to be patent. A permanent ultrasound image was obtained and placed in the patient's medical record. Using sterile gel and a sterile probe cover, the right internal jugular vein was entered with a 21 ga needle during real time ultrasound guidance. 0.018 inch guidewire placed and 21 ga needle exchanged for transitional dilator set. Utilizing fluoroscopy, 0.035 inch guidewire advanced through the needle without difficulty. Serial dilation performed, and catheter inserted over the guidewire. The tip was positioned in the right atrium. All lumens of the catheter aspirated and flushed well. The dialysis lumens were locked with Heparin. The catheter was secured to the skin with suture. The insertion site was covered with a Biopatch and sterile dressing. IMPRESSION: Right IJ temporary non tunneled hemodialysis catheter ready for use. Electronically Signed   By: Miachel Roux M.D.   On: 02/26/2020 13:50   DG Chest Port 1 View  Result Date: 03/13/2020 CLINICAL DATA:  Pneumonia EXAM: PORTABLE CHEST 1 VIEW COMPARISON:  03/01/2020 FINDINGS: Single frontal view of the chest demonstrates  increased opacification of the lower left hemithorax, compatible with increased consolidation and effusion. Right chest is clear. No pneumothorax. Cardiac silhouette is  stable. Right internal jugular dialysis catheter tip overlies superior vena cava. IMPRESSION: 1. Increasing left basilar consolidation and effusion, compatible with given history of pneumonia. Electronically Signed   By: Randa Ngo M.D.   On: 03/13/2020 21:12   DG CHEST PORT 1 VIEW  Result Date: 02/29/2020 CLINICAL DATA:  Fever and chills. EXAM: PORTABLE CHEST 1 VIEW COMPARISON:  02/26/2020 FINDINGS: Right IJ central venous catheter has tip over the SVC and unchanged. Lungs are somewhat hypoinflated. The left base/retrocardiac region is difficult to evaluate due to prominent overlying soft tissues as cannot exclude interspace process or small effusion in the left base. Subtle hazy prominence of the perihilar vessels which may be due to a degree of vascular congestion. Stable borderline cardiomegaly. Remainder of the exam is unchanged. IMPRESSION: 1. Suggestion of minimal vascular congestion. The left base is not well evaluated on this exam as airspace process or a small effusion is possible. Consider PA and lateral chest radiograph for better evaluation of the left base. 2. Right IJ central venous catheter unchanged. Electronically Signed   By: Marin Olp M.D.   On: 02/29/2020 10:56   DG CHEST PORT 1 VIEW  Result Date: 02/26/2020 CLINICAL DATA:  Ventilator dependent. EXAM: PORTABLE CHEST 1 VIEW COMPARISON:  Same day. FINDINGS: Stable cardiomediastinal silhouette. Endotracheal tube is in grossly good position. No pneumothorax or pleural effusion is noted. Both lungs are clear. The visualized skeletal structures are unremarkable. IMPRESSION: No active disease. Electronically Signed   By: Marijo Conception M.D.   On: 02/26/2020 15:58   DG Chest Port 1 View  Result Date: 02/26/2020 CLINICAL DATA:  65 year old female with history of hemodialysis catheter placement. Renal failure. EXAM: PORTABLE CHEST 1 VIEW COMPARISON:  Chest x-ray 01/28/2016. FINDINGS: New right IJ Vas-Cath with tip terminating in the  right atrium. Lung volumes are low. No consolidative airspace disease. No pleural effusions. No pneumothorax. No pulmonary nodule or mass noted. Pulmonary vasculature is normal. Heart size is mildly enlarged. Upper mediastinal contours are within normal limits. IMPRESSION: 1. New right IJ Vas-Cath with tip terminating in the right atrium. No pneumothorax or other acute complicating features. 2. Mild cardiomegaly. Electronically Signed   By: Vinnie Langton M.D.   On: 02/26/2020 12:06   EEG adult  Result Date: 02/27/2020 Lora Havens, MD     02/27/2020  8:41 AM Patient Name: Ivanell Deshotel MRN: [id number] Epilepsy Attending: Lora Havens Referring Physician/Provider: Dr Marianna Payment Date: 02/27/2020 Duration: 22.15 mins Patient history: 65 year old woman with a past medical history significant for recent COVID-19 infection and hypertension presenting with renal failure currently of unclear etiology, complicated by seizure and loss of pulse s/p CPR with initial good return to baseline.  EEG to evaluate for seizures. Level of alertness: Awake AEDs during EEG study: Keppra Technical aspects: This EEG study was done with scalp electrodes positioned according to the 10-20 International system of electrode placement. Electrical activity was acquired at a sampling rate of $Remov'500Hz'lxLKFk$  and reviewed with a high frequency filter of $RemoveB'70Hz'KZPtwQrE$  and a low frequency filter of $RemoveB'1Hz'jFmmuOym$ . EEG data were recorded continuously and digitally stored. Description: The posterior dominant rhythm consists of 9-10 Hz activity of moderate voltage (25-35 uV) seen predominantly in posterior head regions, symmetric and reactive to eye opening and eye closing. EEG showed intermittent generalized 5 to 6 Hz theta slowing as well  as intermittent generalized rhythmic 2 to 3 Hz delta slowing. Hyperventilation and photic stimulation were not performed. ABNORMALITY -Intermittent slow, generalized -intermittent rhythmic slow, generalized IMPRESSION: This  study is suggestive of mild to moderate diffuse encephalopathy, nonspecific etiology. No seizures or epileptiform discharges were seen throughout the recording. Lora Havens   ECHOCARDIOGRAM COMPLETE  Result Date: 03/06/2020    ECHOCARDIOGRAM REPORT   Patient Name:   MYLEA ROARTY Date of Exam: 03/06/2020 Medical Rec #:  [id number]             Height:       63.0 in Accession #:    [id number]            Weight:       167.5 lb Date of Birth:  [birth year]            BSA:          1.793 m Patient Age:    10 years              BP:           151/95 mmHg Patient Gender: F                     HR:           121 bpm. Exam Location:  Inpatient Procedure: 2D Echo, Cardiac Doppler and Color Doppler Indications:    Fever  History:        Patient has no prior history of Echocardiogram examinations.                 Risk Factors:Hypertension. H/O COVID-19 infection.  Sonographer:    Clayton Lefort RDCS (AE) Referring Phys: [id number] Adin  1. Left ventricular ejection fraction, by estimation, is 50 to 55%. The left ventricle has low normal function. The left ventricle has no regional wall motion abnormalities. There is moderate left ventricular hypertrophy. Indeterminate diastolic filling  due to E-A fusion. There is incoordinate septal motion.  2. Right ventricular systolic function is normal. The right ventricular size is normal. There is normal pulmonary artery systolic pressure. The estimated right ventricular systolic pressure is 42.8 mmHg.  3. Left atrial size was moderately dilated.  4. The pericardial effusion is posterior to the left ventricle.  5. The mitral valve is grossly normal. Trivial mitral valve regurgitation.  6. The aortic valve is tricuspid. Aortic valve regurgitation is not visualized. Mild aortic valve sclerosis is present, with no evidence of aortic valve stenosis.  7. The inferior vena cava is normal in size with greater than 50% respiratory variability, suggesting right atrial  pressure of 3 mmHg. Comparison(s): No prior Echocardiogram. Conclusion(s)/Recommendation(s): No evidence of valvular vegetations on this transthoracic echocardiogram. Would recommend a transesophageal echocardiogram to exclude infective endocarditis if clinically indicated. FINDINGS  Left Ventricle: Left ventricular ejection fraction, by estimation, is 50 to 55%. The left ventricle has low normal function. The left ventricle has no regional wall motion abnormalities. The left ventricular internal cavity size was normal in size. There is moderate left ventricular hypertrophy. Incoordinate septal motion. Indeterminate diastolic filling due to E-A fusion. Right Ventricle: The right ventricular size is normal. No increase in right ventricular wall thickness. Right ventricular systolic function is normal. There is normal pulmonary artery systolic pressure. The tricuspid regurgitant velocity is 2.62 m/s, and  with an assumed right atrial pressure of 3 mmHg, the estimated right ventricular systolic pressure is 76.8 mmHg. Left Atrium: Left atrial size was  moderately dilated. Right Atrium: Right atrial size was normal in size. Pericardium: Trivial pericardial effusion is present. The pericardial effusion is posterior to the left ventricle. Mitral Valve: The mitral valve is grossly normal. Trivial mitral valve regurgitation. Tricuspid Valve: The tricuspid valve is grossly normal. Tricuspid valve regurgitation is mild. Aortic Valve: The aortic valve is tricuspid. Aortic valve regurgitation is not visualized. Mild aortic valve sclerosis is present, with no evidence of aortic valve stenosis. Aortic valve mean gradient measures 9.0 mmHg. Aortic valve peak gradient measures 16.5 mmHg. Aortic valve area, by VTI measures 2.12 cm. Pulmonic Valve: The pulmonic valve was normal in structure. Pulmonic valve regurgitation is not visualized. Aorta: The aortic root and ascending aorta are structurally normal, with no evidence of  dilitation. Venous: The inferior vena cava is normal in size with greater than 50% respiratory variability, suggesting right atrial pressure of 3 mmHg. IAS/Shunts: No atrial level shunt detected by color flow Doppler. Additional Comments: There is a small pleural effusion in the left lateral region.  LEFT VENTRICLE PLAX 2D LVIDd:         4.10 cm LVIDs:         3.00 cm LV PW:         1.50 cm LV IVS:        1.40 cm LVOT diam:     2.10 cm LV SV:         66 LV SV Index:   37 LVOT Area:     3.46 cm  RIGHT VENTRICLE          IVC RV Basal diam:  3.40 cm  IVC diam: 1.60 cm LEFT ATRIUM             Index       RIGHT ATRIUM           Index LA diam:        2.60 cm 1.45 cm/m  RA Area:     17.80 cm LA Vol (A2C):   66.7 ml 37.19 ml/m RA Volume:   49.50 ml  27.60 ml/m LA Vol (A4C):   86.4 ml 48.17 ml/m LA Biplane Vol: 76.4 ml 42.60 ml/m  AORTIC VALVE AV Area (Vmax):    2.18 cm AV Area (Vmean):   2.03 cm AV Area (VTI):     2.12 cm AV Vmax:           203.00 cm/s AV Vmean:          145.000 cm/s AV VTI:            0.310 m AV Peak Grad:      16.5 mmHg AV Mean Grad:      9.0 mmHg LVOT Vmax:         128.00 cm/s LVOT Vmean:        84.900 cm/s LVOT VTI:          0.190 m LVOT/AV VTI ratio: 0.61  AORTA Ao Root diam: 3.00 cm Ao Asc diam:  3.20 cm TRICUSPID VALVE TR Peak grad:   27.5 mmHg TR Vmax:        262.00 cm/s  SHUNTS Systemic VTI:  0.19 m Systemic Diam: 2.10 cm Zoila Shutter MD Electronically signed by Zoila Shutter MD Signature Date/Time: 03/06/2020/3:22:26 PM    Final    CT Renal Stone Study  Result Date: 02/25/2020 CLINICAL DATA:  Flank pain for the past week. EXAM: CT ABDOMEN AND PELVIS WITHOUT CONTRAST TECHNIQUE: Multidetector CT imaging of the abdomen and pelvis was performed  following the standard protocol without IV contrast. COMPARISON:  None. FINDINGS: Lower chest: No acute abnormality. Hepatobiliary: No focal liver abnormality is seen. Status post cholecystectomy. No biliary dilatation. Pancreas: Unremarkable. No  pancreatic ductal dilatation or surrounding inflammatory changes. Spleen: Normal in size without focal abnormality. Adrenals/Urinary Tract: Adrenal glands and left kidney are unremarkable. 6 mm nonobstructive right renal calculus. No hydronephrosis. The bladder is unremarkable for the degree of distention. Stomach/Bowel: Stomach is within normal limits. Diminutive or absent appendix. No evidence of bowel wall thickening, distention, or inflammatory changes. Vascular/Lymphatic: No significant vascular findings are present. No enlarged abdominal or pelvic lymph nodes. Reproductive: Small calcified uterine fibroids.  No adnexal mass. Other: No abdominal wall hernia or abnormality. No abdominopelvic ascites. No pneumoperitoneum. Musculoskeletal: No acute or significant osseous findings. IMPRESSION: 1. No acute intra-abdominal process. 2. Nonobstructive right nephrolithiasis. Electronically Signed   By: Titus Dubin M.D.   On: 02/25/2020 19:39   US BIOPSY (KIDNEY)  Result Date: 03/02/2020 CLINICAL DATA:  Renal insufficiency, acute kidney injury, hypertension EXAM: ULTRASOUND GUIDED RENAL CORE BIOPSY COMPARISON:  CT 02/25/2020 TECHNIQUE: Survey ultrasound was performed and an appropriate skin entry site was localized. Markedly echogenic renal parenchyma noted bilaterally. Site was marked, prepped with Betadine, draped in usual sterile fashion, infiltrated locally with 1% lidocaine. Intravenous Fentanyl 40mcg and Versed $RemoveBe'1mg'AaCymttuf$  were administered as conscious sedation during continuous monitoring of the patient's level of consciousness and physiological / cardiorespiratory status by the radiology RN, with a total moderate sedation time of 12 minutes. Under real time ultrasound guidance, a 15 gauge trocar needle was advanced to the margin of the lower pole of the left kidney for 3 coaxial 16 gauge core biopsy needle passes. The core samples were submitted to pathology. The patient tolerated procedure well. COMPLICATIONS:  None immediate IMPRESSION: 1. Technically successful ultrasound-guided core renal biopsy , left lower pole. Electronically Signed   By: Lucrezia Europe M.D.   On: 03/02/2020 15:45   VAS [place] LOWER EXTREMITY VENOUS (DVT)  Result Date: 03/07/2020  Lower Venous DVT Study Indications: Fever of unknown origin.  Comparison Study: No prior study Performing Technologist: Sharion Dove RVS  Examination Guidelines: A complete evaluation includes B-mode imaging, spectral Doppler, color Doppler, and power Doppler as needed of all accessible portions of each vessel. Bilateral testing is considered an integral part of a complete examination. Limited examinations for reoccurring indications may be performed as noted. The reflux portion of the exam is performed with the patient in reverse Trendelenburg.  +---------+---------------+---------+-----------+----------+--------------+ RIGHT    CompressibilityPhasicitySpontaneityPropertiesThrombus Aging +---------+---------------+---------+-----------+----------+--------------+ CFV      Full           Yes      Yes                                 +---------+---------------+---------+-----------+----------+--------------+ SFJ      Full                                                        +---------+---------------+---------+-----------+----------+--------------+ FV Prox  Full                                                        +---------+---------------+---------+-----------+----------+--------------+  FV Mid   Full                                                        +---------+---------------+---------+-----------+----------+--------------+ FV DistalFull                                                        +---------+---------------+---------+-----------+----------+--------------+ PFV      Full                                                        +---------+---------------+---------+-----------+----------+--------------+ POP       Full           Yes      Yes                                 +---------+---------------+---------+-----------+----------+--------------+ PTV      Full                                                        +---------+---------------+---------+-----------+----------+--------------+ PERO     Full                                                        +---------+---------------+---------+-----------+----------+--------------+   +---------+---------------+---------+-----------+----------+--------------+ LEFT     CompressibilityPhasicitySpontaneityPropertiesThrombus Aging +---------+---------------+---------+-----------+----------+--------------+ CFV      Full           Yes      Yes                                 +---------+---------------+---------+-----------+----------+--------------+ SFJ      Full                                                        +---------+---------------+---------+-----------+----------+--------------+ FV Prox  Full                                                        +---------+---------------+---------+-----------+----------+--------------+ FV Mid   Full                                                        +---------+---------------+---------+-----------+----------+--------------+  FV DistalFull                                                        +---------+---------------+---------+-----------+----------+--------------+ PFV      Full                                                        +---------+---------------+---------+-----------+----------+--------------+ POP      Full           Yes      Yes                                 +---------+---------------+---------+-----------+----------+--------------+ PTV      Full                                                        +---------+---------------+---------+-----------+----------+--------------+ PERO     Full                                                         +---------+---------------+---------+-----------+----------+--------------+     Summary: BILATERAL: - No evidence of deep vein thrombosis seen in the lower extremities, bilaterally. -No evidence of popliteal cyst, bilaterally.   *See table(s) above for measurements and observations. Electronically signed by Jamelle Haring on 03/07/2020 at 5:50:28 PM.    Final    VAS [place] UPPER EXT VEIN MAPPING (PRE-OP AVF)  Result Date: 03/10/2020 UPPER EXTREMITY VEIN MAPPING  Indications: Pre-access. Comparison Study: no prior Performing Technologist: Abram Sander RVS  Examination Guidelines: A complete evaluation includes B-mode imaging, spectral Doppler, color Doppler, and power Doppler as needed of all accessible portions of each vessel. Bilateral testing is considered an integral part of a complete examination. Limited examinations for reoccurring indications may be performed as noted. +-----------------+-------------+----------+--------------+ Right Cephalic   Diameter (cm)Depth (cm)   Findings    +-----------------+-------------+----------+--------------+ Shoulder             0.22        0.93                  +-----------------+-------------+----------+--------------+ Prox upper arm       0.19        0.57                  +-----------------+-------------+----------+--------------+ Mid upper arm        0.18        0.63                  +-----------------+-------------+----------+--------------+ Dist upper arm       0.28        0.52                  +-----------------+-------------+----------+--------------+ Antecubital fossa    0.27  0.34                  +-----------------+-------------+----------+--------------+ Prox forearm         0.25        0.47     branching    +-----------------+-------------+----------+--------------+ Mid forearm                             not visualized +-----------------+-------------+----------+--------------+ Dist forearm                             not visualized +-----------------+-------------+----------+--------------+ Wrist                                   not visualized +-----------------+-------------+----------+--------------+ +-----------------+-------------+----------+---------+ Right Basilic    Diameter (cm)Depth (cm)Findings  +-----------------+-------------+----------+---------+ Prox upper arm       0.30        1.04             +-----------------+-------------+----------+---------+ Mid upper arm        0.26        1.35             +-----------------+-------------+----------+---------+ Dist upper arm       0.28        1.28             +-----------------+-------------+----------+---------+ Antecubital fossa    0.20        0.74   branching +-----------------+-------------+----------+---------+ Prox forearm         0.13        0.24             +-----------------+-------------+----------+---------+ Mid forearm          0.12        0.22             +-----------------+-------------+----------+---------+ Distal forearm       0.13        0.27             +-----------------+-------------+----------+---------+ +-----------------+-------------+----------+---------+ Left Cephalic    Diameter (cm)Depth (cm)Findings  +-----------------+-------------+----------+---------+ Shoulder             0.21        0.80             +-----------------+-------------+----------+---------+ Prox upper arm       0.21        0.66             +-----------------+-------------+----------+---------+ Mid upper arm        0.24        0.58             +-----------------+-------------+----------+---------+ Dist upper arm       0.23        0.51             +-----------------+-------------+----------+---------+ Antecubital fossa    0.40        0.21   branching +-----------------+-------------+----------+---------+ Prox forearm         0.22        0.34              +-----------------+-------------+----------+---------+ Mid forearm          0.27        0.46             +-----------------+-------------+----------+---------+ Dist forearm  0.25        0.47             +-----------------+-------------+----------+---------+ Wrist                0.23        0.29             +-----------------+-------------+----------+---------+ +-----------------+-------------+----------+--------------+ Left Basilic     Diameter (cm)Depth (cm)   Findings    +-----------------+-------------+----------+--------------+ Prox upper arm       0.24        1.03                  +-----------------+-------------+----------+--------------+ Mid upper arm        0.22        1.75                  +-----------------+-------------+----------+--------------+ Dist upper arm       0.20        1.37                  +-----------------+-------------+----------+--------------+ Antecubital fossa    0.22        0.83                  +-----------------+-------------+----------+--------------+ Prox forearm         0.16        0.60     branching    +-----------------+-------------+----------+--------------+ Mid forearm                             not visualized +-----------------+-------------+----------+--------------+ Distal forearm                          not visualized +-----------------+-------------+----------+--------------+ Elbow                                   not visualized +-----------------+-------------+----------+--------------+ *See table(s) above for measurements and observations.  Diagnosing physician: Deitra Mayo MD Electronically signed by Deitra Mayo MD on 03/10/2020 at 5:00:01 PM.    Final    VAS [place] UPPER EXTREMITY VENOUS DUPLEX  Result Date: 03/07/2020 UPPER VENOUS STUDY  Indications: fever of unknown origin Limitations: Bandage at right neck. Comparison Study: No prior study Performing Technologist: Sharion Dove RVS   Examination Guidelines: A complete evaluation includes B-mode imaging, spectral Doppler, color Doppler, and power Doppler as needed of all accessible portions of each vessel. Bilateral testing is considered an integral part of a complete examination. Limited examinations for reoccurring indications may be performed as noted.  Right Findings: +----------+------------+---------+-----------+----------+-------+ RIGHT     CompressiblePhasicitySpontaneousPropertiesSummary +----------+------------+---------+-----------+----------+-------+ IJV                      Yes       Yes                      +----------+------------+---------+-----------+----------+-------+ Subclavian               Yes       Yes                      +----------+------------+---------+-----------+----------+-------+ Axillary                 Yes       Yes                      +----------+------------+---------+-----------+----------+-------+  Brachial      Full       Yes       Yes                      +----------+------------+---------+-----------+----------+-------+ Radial        Full                                          +----------+------------+---------+-----------+----------+-------+ Ulnar         Full                                          +----------+------------+---------+-----------+----------+-------+ Cephalic      Full                                          +----------+------------+---------+-----------+----------+-------+ Basilic       Full                                          +----------+------------+---------+-----------+----------+-------+  Left Findings: +----------+------------+---------+-----------+----------+-------+ LEFT      CompressiblePhasicitySpontaneousPropertiesSummary +----------+------------+---------+-----------+----------+-------+ IJV           Full       Yes       Yes                       +----------+------------+---------+-----------+----------+-------+ Subclavian               Yes       Yes                      +----------+------------+---------+-----------+----------+-------+ Axillary                 Yes       Yes                      +----------+------------+---------+-----------+----------+-------+ Brachial      Full       Yes       Yes                      +----------+------------+---------+-----------+----------+-------+ Radial        Full                                          +----------+------------+---------+-----------+----------+-------+ Ulnar         Full                                          +----------+------------+---------+-----------+----------+-------+ Cephalic      Full                                          +----------+------------+---------+-----------+----------+-------+  Summary:  Right: No evidence of  deep vein thrombosis in the upper extremity. No evidence of superficial vein thrombosis in the upper extremity.  Left: No evidence of deep vein thrombosis in the upper extremity. No evidence of superficial vein thrombosis in the upper extremity.  *See table(s) above for measurements and observations.  Diagnosing physician: Jamelle Haring Electronically signed by Jamelle Haring on 03/07/2020 at 5:50:10 PM.    Final      Antimicrobials:   NONE CURRENTLY    Subjective: Resting comfortably in bed No acute events overnight  Objective: Vitals:   03/21/20 1936 03/21/20 2006 03/22/20 0045 03/22/20 0547  BP: 125/79 134/84 (!) 147/95 132/79  Pulse:   87 74  Resp: 17 (!) 27 (!) 30 20  Temp:   97.8 F (36.6 C) 98.5 F (36.9 C)  TempSrc:   Oral Oral  SpO2:   99% 98%  Weight:      Height:       No intake or output data in the 24 hours ending 03/22/20 1231 Filed Weights   03/19/20 0500 03/19/20 0725 03/19/20 1124  Weight: 68.7 kg 66.3 kg 63.7 kg    Examination:  General exam: Appears calm and comfortable, IJ  tunneled catheter for dialysis Respiratory system: Clear to auscultation. Respiratory effort normal. Cardiovascular system: S1 & S2 heard, RRR. No JVD, murmurs, rubs, gallops or clicks. No pedal edema. Gastrointestinal system: Abdomen is nondistended, soft and nontender. No organomegaly or masses felt. Normal bowel sounds heard. Central nervous system: Alert and oriented. No focal neurological deficits. Extremities: Warm well perfused, trace bilateral lower extremity edema Skin: No rashes, lesions or ulcers Psychiatry: Judgement and insight appear normal. Mood & affect appropriate.     Data Reviewed: I have personally reviewed following labs and imaging studies  CBC: Recent Labs  Lab 03/17/20 1527 03/18/20 0318 03/19/20 0348 03/21/20 0138 03/21/20 1800  WBC 10.6* 7.6 7.2 8.7 9.8  HGB 7.2* 7.3* 7.0* 7.1* 7.3*  HCT 22.3* 22.3* 20.9* 21.0* 23.1*  MCV 91.4 89.6 89.7 89.4 91.7  PLT 303 285 282 267 832   Basic Metabolic Panel: Recent Labs  Lab 03/16/20 0248 03/17/20 1526 03/18/20 0318 03/19/20 0348 03/21/20 1800  NA 133* 131* 136 134* 132*  K 3.7 4.1 3.9 3.7 4.4  CL 97* 95* 101 99 98  CO2 $Re'24 23 23 24 26  'lbI$ GLUCOSE 83 124* 92 93 139*  BUN 30* 50* 22 38* 42*  CREATININE 7.35* 10.40* 5.84* 7.92* 8.15*  CALCIUM 7.8* 7.7* 8.2* 7.9* 7.9*  MG 1.9  --  1.9 1.9  --   PHOS  --  5.7* 3.8  --  3.6   GFR: Estimated Creatinine Clearance: 6.3 mL/min (A) (by C-G formula based on SCr of 8.15 mg/dL (H)). Liver Function Tests: Recent Labs  Lab 03/17/20 1526 03/18/20 0318 03/21/20 1800  AST  --  42*  --   ALT  --  29  --   ALKPHOS  --  42  --   BILITOT  --  0.7  --   PROT  --  6.6  --   ALBUMIN 1.9* 2.0* 1.8*   No results for input(s): LIPASE, AMYLASE in the last 168 hours. No results for input(s): AMMONIA in the last 168 hours. Coagulation Profile: No results for input(s): INR, PROTIME in the last 168 hours. Cardiac Enzymes: Recent Labs  Lab 03/16/20 1139  CKTOTAL 81   BNP  (last 3 results) No results for input(s): PROBNP in the last 8760 hours. HbA1C: No results for input(s): HGBA1C in  the last 72 hours. CBG: Recent Labs  Lab 03/21/20 1137 03/21/20 1552 03/22/20 0056 03/22/20 0740 03/22/20 1206  GLUCAP 103* 116* 90 85 90   Lipid Profile: No results for input(s): CHOL, HDL, LDLCALC, TRIG, CHOLHDL, LDLDIRECT in the last 72 hours. Thyroid Function Tests: No results for input(s): TSH, T4TOTAL, FREET4, T3FREE, THYROIDAB in the last 72 hours. Anemia Panel: No results for input(s): VITAMINB12, FOLATE, FERRITIN, TIBC, IRON, RETICCTPCT in the last 72 hours. Sepsis Labs: No results for input(s): PROCALCITON, LATICACIDVEN in the last 168 hours.  Recent Results (from the past 240 hour(s))  Culture, blood (routine x 2)     Status: None   Collection Time: 03/13/20  9:35 PM   Specimen: BLOOD  Result Value Ref Range Status   Specimen Description BLOOD RIGHT ARM  Final   Special Requests   Final    BOTTLES DRAWN AEROBIC AND ANAEROBIC Blood Culture adequate volume   Culture   Final    NO GROWTH 5 DAYS Performed at [facility], [address] [place], [place] [address]    Report Status 03/18/2020 FINAL  Final  Culture, blood (routine x 2)     Status: None   Collection Time: 03/13/20  9:41 PM   Specimen: BLOOD  Result Value Ref Range Status   Specimen Description BLOOD RIGHT HAND  Final   Special Requests   Final    BOTTLES DRAWN AEROBIC AND ANAEROBIC Blood Culture adequate volume   Culture   Final    NO GROWTH 5 DAYS Performed at [facility], [place] [address], [place], [place] [address]    Report Status 03/18/2020 FINAL  Final  Culture, Urine     Status: None   Collection Time: 03/14/20  3:00 AM   Specimen: Urine, Random  Result Value Ref Range Status   Specimen Description URINE, RANDOM  Final   Special Requests NONE  Final   Culture   Final    NO GROWTH Performed at [facility], [place] [address], [place], [place] [address]     Report Status 03/15/2020 FINAL  Final         Radiology Studies: No results found.      Scheduled Meds: . chlorhexidine gluconate (MEDLINE KIT)  15 mL Mouth Rinse BID  . Chlorhexidine Gluconate Cloth  6 each Topical Q0600  . Chlorhexidine Gluconate Cloth  6 each Topical Q0600  . darbepoetin (ARANESP) injection - DIALYSIS  60 mcg Intravenous Q Tue-HD  . dextromethorphan  30 mg Oral BID  . docusate sodium  100 mg Oral BID  . feeding supplement  237 mL Oral TID BM  . levETIRAcetam  250 mg Oral Q T,Th,Sa-HD  . levETIRAcetam  500 mg Oral BID  . mouth rinse  15 mL Mouth Rinse BID  . metoprolol tartrate  75 mg Oral BID  . multivitamin  1 tablet Oral QHS  . NIFEdipine  90 mg Oral Daily  . predniSONE  30 mg Oral Q breakfast   Followed by  . [START ON 03/27/2020] predniSONE  20 mg Oral Q breakfast   Followed by  . [START ON 04/01/2020] predniSONE  10 mg Oral Q breakfast   Continuous Infusions: . sodium chloride 10 mL/hr at 03/14/20 1441     LOS: 26 days    Time spent: [place], MD [facility]  If 7PM-7AM, please contact night-coverage  03/22/2020, 12:31 PM

## 2020-03-22 NOTE — Progress Notes (Signed)
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  Continue dialysis on TTS schedule with process underway for outpatient dialysis unit placement (this has been difficult due to the unique insurance coverage that the patient has).  She had creation of a left brachiocephalic fistula by Dr. Carlis Abbott on 3/11 and is currently getting hemodialysis via a right IJ tunneled hemodialysis catheter. Status post HD yesterday with 2.7 L UF, tolerated well.  Plan for next HD on 3/22. Awaiting outpatient HD center.  # Suspected healthcare associated pneumonia: On intravenous cefepime and vancomycin and remains intermittently febrile.  She has fever of unknown origin that has been worked up with imaging/cultures.  Yesterday, started her on prednisone for suspected autoimmune mechanism associated fevers, weaning gradually.  # Anemia: Likely from underlying chronic disease and surgical losses.  Ferritin level very high. Continue Aranesp and transfuse as needed.   # Hypertension: Blood pressure acceptable. LE  edema manage with dialysis./Ultrafiltration.  # Secondary hyperparathyroidism: Calcium and phosphorus under acceptable control, not on binders at this time.  PTH level was 193 on 2/23-at goal for ESRD.  # Hyponatremia: Managed with dialysis, continue fluid restriction.  Subjective: Seen and examined at bedside.  Tolerated dialysis well.  Denies nausea vomiting, chest pain, shortness of breath.  No new event. Objective Vital signs in last 24 hours: Vitals:   03/21/20 1936 03/21/20 2006 03/22/20 0045 03/22/20 0547  BP: 125/79 134/84 (!) 147/95 132/79  Pulse:   87 74  Resp: 17 (!) 27 (!) 30 20  Temp:   97.8 F (36.6 C) 98.5 F (36.9 C)  TempSrc:   Oral Oral  SpO2:   99% 98%  Weight:       Height:       Weight change:  No intake or output data in the 24 hours ending 03/22/20 0955     Labs: Basic Metabolic Panel: Recent Labs  Lab 03/17/20 1526 03/18/20 0318 03/19/20 0348 03/21/20 1800  NA 131* 136 134* 132*  K 4.1 3.9 3.7 4.4  CL 95* 101 99 98  CO2 23 23 24 26   GLUCOSE 124* 92 93 139*  BUN 50* 22 38* 42*  CREATININE 10.40* 5.84* 7.92* 8.15*  CALCIUM 7.7* 8.2* 7.9* 7.9*  PHOS 5.7* 3.8  --  3.6   Liver Function Tests: Recent Labs  Lab 03/17/20 1526 03/18/20 0318 03/21/20 1800  AST  --  42*  --   ALT  --  29  --   ALKPHOS  --  42  --   BILITOT  --  0.7  --   PROT  --  6.6  --   ALBUMIN 1.9* 2.0* 1.8*   No results for input(s): LIPASE, AMYLASE in the last 168 hours. No results for input(s): AMMONIA in the last 168 hours. CBC: Recent Labs  Lab 03/17/20 1527 03/18/20 0318 03/19/20 0348 03/21/20 0138 03/21/20 1800  WBC 10.6* 7.6 7.2 8.7 9.8  HGB 7.2* 7.3* 7.0* 7.1* 7.3*  HCT 22.3* 22.3* 20.9* 21.0* 23.1*  MCV 91.4 89.6 89.7 89.4 91.7  PLT 303 285 282 267 268   Cardiac Enzymes: Recent Labs  Lab 03/16/20 1139  CKTOTAL 81   CBG: Recent Labs  Lab 03/21/20 0733 03/21/20 1137 03/21/20 1552 03/22/20 0056 03/22/20 0740  GLUCAP 88 103* 116* 90 85    Iron Studies: No results  for input(s): IRON, TIBC, TRANSFERRIN, FERRITIN in the last 72 hours. Studies/Results: No results found.  Medications: Infusions: . sodium chloride 10 mL/hr at 03/14/20 1441    Scheduled Medications: . chlorhexidine gluconate (MEDLINE KIT)  15 mL Mouth Rinse BID  . Chlorhexidine Gluconate Cloth  6 each Topical Q0600  . Chlorhexidine Gluconate Cloth  6 each Topical Q0600  . darbepoetin (ARANESP) injection - DIALYSIS  60 mcg Intravenous Q Tue-HD  . dextromethorphan  30 mg Oral BID  . docusate sodium  100 mg Oral BID  . feeding supplement  237 mL Oral TID BM  . heparin sodium (porcine)      . levETIRAcetam  250 mg Oral Q T,Th,Sa-HD  . levETIRAcetam  500 mg  Oral BID  . mouth rinse  15 mL Mouth Rinse BID  . metoprolol tartrate  75 mg Oral BID  . multivitamin  1 tablet Oral QHS  . NIFEdipine  90 mg Oral Daily  . predniSONE  30 mg Oral Q breakfast   Followed by  . [START ON 03/27/2020] predniSONE  20 mg Oral Q breakfast   Followed by  . [START ON 04/01/2020] predniSONE  10 mg Oral Q breakfast    have reviewed scheduled and prn medications.  Physical Exam: General:NAD, resting comfortably Heart:RRR, s1s2 nl Lungs:clear b/l, no crackle Abdomen:soft, Non-tender, non-distended Extremities: Bilateral LE edema present+ Dialysis Access: Right IJ TDC, site clean.  Rhylan Gross Prasad Mozes Sagar 03/22/2020,9:55 AM  LOS: 26 days

## 2020-03-23 DIAGNOSIS — N179 Acute kidney failure, unspecified: Secondary | ICD-10-CM | POA: Diagnosis not present

## 2020-03-23 LAB — CBC WITH DIFFERENTIAL/PLATELET
Abs Immature Granulocytes: 0.38 10*3/uL — ABNORMAL HIGH (ref 0.00–0.07)
Basophils Absolute: 0.1 10*3/uL (ref 0.0–0.1)
Basophils Relative: 1 %
Eosinophils Absolute: 0.2 10*3/uL (ref 0.0–0.5)
Eosinophils Relative: 2 %
HCT: 22.4 % — ABNORMAL LOW (ref 36.0–46.0)
Hemoglobin: 7 g/dL — ABNORMAL LOW (ref 12.0–15.0)
Immature Granulocytes: 4 %
Lymphocytes Relative: 12 %
Lymphs Abs: 1.2 10*3/uL (ref 0.7–4.0)
MCH: 28.9 pg (ref 26.0–34.0)
MCHC: 31.3 g/dL (ref 30.0–36.0)
MCV: 92.6 fL (ref 80.0–100.0)
Monocytes Absolute: 1.3 10*3/uL — ABNORMAL HIGH (ref 0.1–1.0)
Monocytes Relative: 13 %
Neutro Abs: 6.8 10*3/uL (ref 1.7–7.7)
Neutrophils Relative %: 68 %
Platelets: 225 10*3/uL (ref 150–400)
RBC: 2.42 MIL/uL — ABNORMAL LOW (ref 3.87–5.11)
RDW: 15.5 % (ref 11.5–15.5)
WBC: 9.9 10*3/uL (ref 4.0–10.5)
nRBC: 0 % (ref 0.0–0.2)

## 2020-03-23 LAB — GLUCOSE, CAPILLARY
Glucose-Capillary: 146 mg/dL — ABNORMAL HIGH (ref 70–99)
Glucose-Capillary: 89 mg/dL (ref 70–99)
Glucose-Capillary: 96 mg/dL (ref 70–99)
Glucose-Capillary: 105 mg/dL — ABNORMAL HIGH (ref 70–99)

## 2020-03-23 LAB — BASIC METABOLIC PANEL
Anion gap: 9 (ref 5–15)
BUN: 23 mg/dL (ref 8–23)
CO2: 27 mmol/L (ref 22–32)
Calcium: 8.3 mg/dL — ABNORMAL LOW (ref 8.9–10.3)
Chloride: 99 mmol/L (ref 98–111)
Creatinine, Ser: 5.92 mg/dL — ABNORMAL HIGH (ref 0.44–1.00)
GFR, Estimated: 7 mL/min — ABNORMAL LOW (ref >60)
Glucose, Bld: 90 mg/dL (ref 70–99)
Potassium: 3.7 mmol/L (ref 3.5–5.1)
Sodium: 135 mmol/L (ref 135–145)

## 2020-03-23 NOTE — Progress Notes (Signed)
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  Continue dialysis on TTS schedule with process underway for outpatient dialysis unit placement (this has been difficult due to the unique insurance coverage that the patient has).  She had creation of a left brachiocephalic fistula by Dr. Carlis Abbott on 3/11 and is currently getting hemodialysis via a right IJ tunneled hemodialysis catheter. Status post HD 3/19 with 2.7 L UF, tolerated well.  Plan for next HD on 3/22. Awaiting outpatient HD center.  # Suspected healthcare associated pneumonia: On intravenous cefepime and vancomycin and remains intermittently febrile.  She has fever of unknown origin that has been worked up with imaging/cultures.  Was started her on prednisone for suspected autoimmune mechanism associated fevers, weaning graduall, per primary.  # Anemia: Likely from underlying chronic disease and surgical losses.  Ferritin level very high. Continue Aranesp and transfuse as needed.   # Hypertension: Blood pressure acceptable. LE  edema manage with dialysis/Ultrafiltration.  # Secondary hyperparathyroidism: Calcium and phosphorus under acceptable control, not on binders at this time.  PTH level was 193 on 2/23-at goal for ESRD.  # Hyponatremia: Improved. Managed with dialysis, continue fluid restriction.  Subjective: Seen and examined at bedside.  No acute events. Motivated to get out of the hospital safely. No complaints. Objective Vital signs in last 24 hours: Vitals:   03/22/20 0045 03/22/20 0547 03/22/20 1507 03/22/20 2117  BP: (!) 147/95 132/79 131/78 121/81  Pulse: 87 74 83 83  Resp: (!) _0 Temp: 97.8 F (36.6 C) 98.5 F (36.9 C) 98.5 F (36.9 C) 98.6 F (37 C)  TempSrc: Oral Oral  Oral   SpO2: 99% 98% 98% 100%  Weight:      Height:       Weight change:   Intake/Output Summary (Last 24 hours) at 03/23/2020 1147 Last data filed at 03/22/2020 1800 Gross per 24 hour  Intake 750 ml  Output -  Net 750 ml       Labs: Basic Metabolic Panel: Recent Labs  Lab 03/17/20 1526 03/18/20 0318 03/19/20 0348 03/21/20 1800 03/23/20 0219  NA 131* 136 134* 132* 135  K 4.1 3.9 3.7 4.4 3.7  CL 95* 101 99 98 99  CO2 _1 GLUCOSE 124* 92 93 139* 90  BUN 50* 22 38* 42* 23  CREATININE 10.40* 5.84* 7.92* 8.15* 5.92*  CALCIUM 7.7* 8.2* 7.9* 7.9* 8.3*  PHOS 5.7* 3.8  --  3.6  --    Liver Function Tests: Recent Labs  Lab 03/17/20 1526 03/18/20 0318 03/21/20 1800  AST  --  42*  --   ALT  --  29  --   ALKPHOS  --  42  --   BILITOT  --  0.7  --   PROT  --  6.6  --   ALBUMIN 1.9* 2.0* 1.8*   No results for input(s): LIPASE, AMYLASE in the last 168 hours. No results for input(s): AMMONIA in the last 168 hours. CBC: Recent Labs  Lab 03/18/20 0318 03/19/20 0348 03/21/20 0138 03/21/20 1800 03/23/20 0219  WBC 7.6 7.2 8.7 9.8 9.9  NEUTROABS  --   --   --   --  6.8  HGB 7.3* 7.0* 7.1* 7.3* 7.0*  HCT 22.3* 20.9* 21.0* 23.1* 22.4*  MCV 89.6 89.7 89.4  91.7 92.6  PLT 285 282 267 268 225   Cardiac Enzymes: No results for input(s): CKTOTAL, CKMB, CKMBINDEX, TROPONINI in the last 168 hours. CBG: Recent Labs  Lab 03/22/20 0740 03/22/20 1206 03/22/20 1545 03/22/20 2115 03/23/20 0840  GLUCAP 85 90 110* 105* 146*    Iron Studies: No results for input(s): IRON, TIBC, TRANSFERRIN, FERRITIN in the last 72 hours. Studies/Results: No results found.  Medications: Infusions: . sodium chloride 10 mL/hr at 03/14/20 1441    Scheduled Medications: . chlorhexidine gluconate (MEDLINE KIT)  15 mL Mouth Rinse BID  . Chlorhexidine Gluconate Cloth  6 each Topical Q0600  . Chlorhexidine Gluconate Cloth  6 each Topical Q0600  . darbepoetin (ARANESP) injection -  DIALYSIS  60 mcg Intravenous Q Tue-HD  . dextromethorphan  30 mg Oral BID  . docusate sodium  100 mg Oral BID  . feeding supplement  237 mL Oral TID BM  . levETIRAcetam  250 mg Oral Q T,Th,Sa-HD  . levETIRAcetam  500 mg Oral BID  . mouth rinse  15 mL Mouth Rinse BID  . metoprolol tartrate  75 mg Oral BID  . multivitamin  1 tablet Oral QHS  . NIFEdipine  90 mg Oral Daily  . predniSONE  30 mg Oral Q breakfast   Followed by  . [START ON 03/27/2020] predniSONE  20 mg Oral Q breakfast   Followed by  . [START ON 04/01/2020] predniSONE  10 mg Oral Q breakfast    have reviewed scheduled and prn medications.  Physical Exam: General:NAD Heart:RRR, s1s2 nl Lungs:clear b/l, no w/r/r/c, unlabored, bl chest expansion Abdomen:soft, Non-tender, non-distended Extremities: Bilateral LE edema present+ Dialysis Access: Right IJ TDC, site clean.  Vikas Singh 03/23/2020,11:47 AM  LOS: 27 days

## 2020-03-23 NOTE — Progress Notes (Signed)
Physical Therapy Treatment Patient Details Name: Barbara Crawford MRN: [id number] DOB: [birth year] Today's Date: 03/23/2020    History of Present Illness 65 y.o. female with medical history significant of HTN. Pt had COVID in Dec, 1 week prior to presenting to ED 02/25/20 she developed swelling in BLE. Developed pain in B lower back; CT abd/pelvis - no obstruction or acute abnormality. HGB 6.3, +acute renal failure; 2/23 seizure and Code Blue with CPR x 4 minutes and intubated 2/23-2/25; CRRT; renal biopsy 2/28 with perinephric hematoma; pneumonia;  IJ catheter placed and HD initiated. Plans to have dialysis access placed 3/11    PT Comments    Patient reports fatigue and "fuzziness" after taking seizure medication. Patient reports having just returned to bed after walking to/from bathroom, and willing to work on stair climbing with PT. Patient appeared stronger while doing steps today, however required seated rest break after 6 steps due to dizziness. Patient rested and then completed 7 more steps with rail (using single step at bedside with bed elevated to allow use of bed rail to simulate handrail on stairs). Patient continues to feel she will make it up her steps (20) after a dialysis session. She does endorse that she is going to talk to her landlord about moving to a first floor unit if one becomes available.     Follow Up Recommendations  Home health PT;Supervision - Intermittent     Equipment Recommendations  Rolling walker with 5" wheels    Recommendations for Other Services       Precautions / Restrictions Precautions Precautions: Fall    Mobility  Bed Mobility Overal bed mobility: Needs Assistance Bed Mobility: Supine to Sit     Supine to sit: Modified independent (Device/Increase time)          Transfers Overall transfer level: Needs assistance Equipment used: None;Rolling walker (2 wheeled) Transfers: Sit to/from Stand Sit to Stand: Supervision          General transfer comment: Supervision A for safety due to "fuzzy feeling from meds." Good recall of hand placement.  Ambulation/Gait Ambulation/Gait assistance: Min guard Gait Distance (Feet): 30 Feet Assistive device: Rolling walker (2 wheeled) Gait Pattern/deviations: Step-through pattern;Decreased stride length;Wide base of support Gait velocity: very slow   General Gait Details: distance she says from car to her steps   Stairs   Stairs assistance: Min assist Stair Management: One rail Right;Step to pattern;Forwards Number of Stairs: 13 General stair comments: seated rest between due to dizziness/fatigue   Wheelchair Mobility    Modified Rankin (Stroke Patients Only)       Balance   Sitting-balance support: Feet supported Sitting balance-Leahy Scale: Good     Standing balance support: No upper extremity supported;During functional activity Standing balance-Leahy Scale: Good                              Cognition Arousal/Alertness: Awake/alert Behavior During Therapy: Flat affect Overall Cognitive Status: Within Functional Limits for tasks assessed                                 General Comments: ?slow processing vs generalized fatigue and "fuzzy feeling from seizure meds"      Exercises      General Comments General comments (skin integrity, edema, etc.): OT plans to see immediately after PT session while pt fatigued (simulate fatigue she has after  HD)      Pertinent Vitals/Pain Pain Assessment: No/denies pain    Home Living                      Prior Function            PT Goals (current goals can now be found in the care plan section) Acute Rehab PT Goals Patient Stated Goal: To return home. PT Goal Formulation: With patient Time For Goal Achievement: 04/06/20 Potential to Achieve Goals: Good Progress towards PT goals: Progressing toward goals (goals updated due to timeframe)    Frequency    Min  3X/week      PT Plan Current plan remains appropriate    Co-evaluation              AM-PAC PT "6 Clicks" Mobility   Outcome Measure  Help needed turning from your back to your side while in a flat bed without using bedrails?: None Help needed moving from lying on your back to sitting on the side of a flat bed without using bedrails?: A Little Help needed moving to and from a bed to a chair (including a wheelchair)?: A Little Help needed standing up from a chair using your arms (e.g., wheelchair or bedside chair)?: A Little Help needed to walk in hospital room?: A Little Help needed climbing 3-5 steps with a railing? : A Little 6 Click Score: 19    End of Session Equipment Utilized During Treatment: Gait belt Activity Tolerance: Patient limited by fatigue Patient left: in chair;with call bell/phone within reach   PT Visit Diagnosis: Muscle weakness (generalized) (M62.81)     Time: [id number] PT Time Calculation (min) (ACUTE ONLY): 22 min  Charges:  $Gait Training: 8-22 mins                      Arby Barrette, PT Pager [phone]    [name] 03/23/2020, 11:36 AM

## 2020-03-23 NOTE — Progress Notes (Signed)
Occupational Therapy Treatment Patient Details Name: Barbara Crawford MRN: [id number] DOB: 09-Nov-1955 Today's Date: 03/23/2020    History of present illness 64 y.o. female with medical history significant of HTN. Pt had COVID in Dec, 1 week prior to presenting to ED 02/25/20 she developed swelling in BLE. Developed pain in B lower back; CT abd/pelvis - no obstruction or acute abnormality. HGB 6.3, +acute renal failure; 2/23 seizure and Code Blue with CPR x 4 minutes and intubated 2/23-2/25; CRRT; renal biopsy 2/28 with perinephric hematoma; pneumonia;  IJ catheter placed and HD initiated. Plans to have dialysis access placed 3/11   OT comments  Pt making steady progress toward goals. Pt/son discussed concerns they had about home DC regarding the hours the pt will be alone. Focus of session on education regarding energy conservation, strategies to reduce risk of falls and home set up to maximize functional level of independence and safety. Discussed current diet and fluid restrictions. Pt states she is "no longer on a renal diet and only has to follow a cardiac diet". She also states she is suppose to have between 35-45 of liquid per day. When asked for details regarding fluid restrictions, she was unable to give details. Son present and also asking about diet restrictions. Reached out to nutritionist regarding need for further education, who will follow up with pt before DC. Thank you!  Will continue to follow acutely to recommend safe DC home. Pt/son very appreciative.  Recommend HHOT.   Follow Up Recommendations  Home health OT;Other (comment) (S with mobility and ADL tasks)    Equipment Recommendations  3 in 1 bedside commode    Recommendations for Other Services      Precautions / Restrictions Precautions Precautions: Fall       Mobility Bed Mobility Overal bed mobility: Needs Assistance Bed Mobility: Supine to Sit     Supine to sit: Modified independent (Device/Increase  time)     General bed mobility comments: OOB in chair    Transfers Overall transfer level: Needs assistance Equipment used: None;Rolling walker (2 wheeled) Transfers: Sit to/from Stand Sit to Stand: Supervision         General transfer comment: Supervision A for safety due to "fuzzy feeling from meds." Good recall of hand placement.    Balance   Sitting-balance support: Feet supported Sitting balance-Leahy Scale: Good     Standing balance support: No upper extremity supported;During functional activity Standing balance-Leahy Scale: Fair Standing balance comment: using RW for support                           ADL either performed or assessed with clinical judgement   ADL Overall ADL's : Needs assistance/impaired Eating/Feeding: Modified independent   Grooming: Set up;Supervision/safety;Standing   Upper Body Bathing: Supervision/ safety;Set up;Sitting   Lower Body Bathing: Supervison/ safety;Sit to/from stand   Upper Body Dressing : Set up;Sitting       Toilet Transfer: Supervision/safety;RW   Toileting- Clothing Manipulation and Hygiene: Modified independent       Functional mobility during ADLs: Supervision/safety;Rolling walker General ADL Comments: Limited by decreased activity tolerance. Focus of sesison on energy conservation and strategies to reduce risk of falls. Discussed using he 3in1 as a showerchair to take birdbaths due to HD catheter Discussed removing throw rugs, possibly using BSC when family is not present initially to reduce risk of falls; use of long handled sponge/reacher to help with ADL/IADL tasks and using a walker bag  to increase safety and independence with item transport; educated on importance of initial S with all medication management Discussed dialysis and the need for transportation as pt will be unable to drive     Vision       Perception     Praxis      Cognition Arousal/Alertness: Awake/alert Behavior During  Therapy: Flat affect Overall Cognitive Status: Within Functional Limits for tasks assessed                                 General Comments: ?slow processing vs generalized fatigue and "fuzzy feeling from seizure meds"; will further assess; pt tearful at one point when talking about her life changes - listened to pt talked about her grieving for her "previous independence" and how she is no longer able to do some of the things she enjoyed doing; son present and very supportive of his Mom        Exercises Other Exercises Other Exercises: encouraged use of IS   Shoulder Instructions       General Comments OT plans to see immediately after PT session while pt fatigued (simulate fatigue she has after HD)    Pertinent Vitals/ Pain       Pain Assessment: No/denies pain  Home Living                                          Prior Functioning/Environment              Frequency  Min 2X/week        Progress Toward Goals  OT Goals(current goals can now be found in the care plan section)  Progress towards OT goals: Progressing toward goals  Acute Rehab OT Goals Patient Stated Goal: To return home. OT Goal Formulation: With patient Time For Goal Achievement: 04/03/20 Potential to Achieve Goals: Good ADL Goals Pt Will Perform Grooming: standing;with supervision Pt Will Perform Lower Body Dressing: with supervision;sit to/from stand Pt Will Transfer to Toilet: ambulating;with supervision Pt Will Perform Toileting - Clothing Manipulation and hygiene: with supervision;sit to/from stand Pt Will Perform Tub/Shower Transfer: with supervision;3 in 1 Pt/caregiver will Perform Home Exercise Program: Both right and left upper extremity;Increased strength;With written HEP provided  Plan Discharge plan remains appropriate;Frequency remains appropriate    Co-evaluation                 AM-PAC OT "6 Clicks" Daily Activity     Outcome Measure    Help from another person eating meals?: None Help from another person taking care of personal grooming?: A Little Help from another person toileting, which includes using toliet, bedpan, or urinal?: A Little Help from another person bathing (including washing, rinsing, drying)?: A Little Help from another person to put on and taking off regular upper body clothing?: A Little Help from another person to put on and taking off regular lower body clothing?: A Little 6 Click Score: 19    End of Session Equipment Utilized During Treatment: Rolling walker  OT Visit Diagnosis: Unsteadiness on feet (R26.81);Muscle weakness (generalized) (M62.81)   Activity Tolerance Patient tolerated treatment well   Patient Left in chair;with call bell/phone within reach;with family/visitor present   Nurse Communication Mobility status        Time: [id number] OT Time Calculation (min): 40 min  Charges: OT  General Charges $OT Visit: 1 Visit OT Treatments $Self Care/Home Management : 38-52 mins  Maurie Boettcher, OT/L   Acute OT Clinical Specialist Acute Rehabilitation Services Pager [phone] Office [phone]    [facility] 03/23/2020, 1:30 PM

## 2020-03-23 NOTE — Progress Notes (Signed)
Renal Navigator received clarification from [employer] team that though the [employer] shows that [facility] is in network for the "[employer]" plan, [employer] (MD group who round in the [facility] clinics) are not in network with this plan. Insurance team explained that patient needs to have an insurance plan that is in network with both [place] and [place]. Navigator appreciative of the further clarification. This situation has been so difficult because patient does not actually have the insurance plan that Navigator can submit to the financial team. She is trying to drop her [place] (not in network with [facility]) and purchase a plan to be effective as of 04/03/20. Navigator has notified patient's Research scientist (life sciences) with the updated information, as patient has previously given Navigator permission to speak with him directly. Navigator has checked on [employer] plan, per his request, and per [employer], they are in network with both [place] and [employer]. Navigator has also sent this plan to [place] team to request verification and then will update patient.   Alphonzo Cruise, [place] Renal Navigator [phone]

## 2020-03-23 NOTE — Progress Notes (Signed)
Per [facility] Insurance team, it also appears that the [employer] is in network with both [place] and [facility], however, it cannot be formally verified until she has a policy number and I make a referral. 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 Navigator explained again that this plan will not allow her to go to dialysis in [place]. Patient stated understanding. Navigator explained that patient was declined at [facility] under her [place], but that perhaps she will be approved with [employer], however, I will not know until she has a policy number for me to submit to them for a new referral to an MD who accepts [employer]. Navigator discussed the possibility of referring her to a [place] clinic that accepts [facility] for the next two weeks to get her discharged before her new policy is in effect, but Navigator spoke to [employer] about this, and she said that she would not be able to put her in a clinic for just 3-4 treatments. Admissions Coordinator states she will have their Insurance Coordinator contact Renal Navigator tomorrow, as she is not in the office today.   Alphonzo Cruise, [place] Renal Navigator [phone]

## 2020-03-23 NOTE — Progress Notes (Signed)
Pt refused Keppra. Per pt, medication makes her feel weak and dizzy and "not herself". She reported this issue to attending MD this morning.  Importance of taking anti seizure med discussed with pt.  Mans[place], MD notified.

## 2020-03-23 NOTE — Progress Notes (Signed)
PROGRESS NOTE    Barbara Crawford  PYK:[id number] DOB: 1955/12/31 DOA: 02/25/2020 PCP: Vonna Drafts, FNP   Brief Narrative:  [name] is a 65 yo female with past medical history of hypertension, prior Covid (Dec 2021) presented to the hospital with complaints of lower extremity swelling, back pain, generalized malaise, fatigue at home. In the ED,she was found to have a hemoglobin of 6.3, potassium 5.4, sodium 130, bicarb 9, BUN 144, and creatinine 29.98. CT of the abdomen and pelvis was negative for obstruction or acute abnormality. Temporary hemodialysis catheter was placed by IR and subsequently, the patient had a PEA arrest associated with seizure-like activity. She was intubated and subsequently extubated 02/28/2020. 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 Renal biopsy done 03/03/2019. ANA positive. ID followed the patient for fever.Trialysis cath was removed on 03/06/20 and subsequently underwent permacath placement.  During hospitalization, patient continued to have fever so ID wasreconsulted.Extensive work-up was done in the past for fever. Recent x-ray showed consolidation in the lungs. Patient was started on vancomycin and cefepime for possible healthcare associated pneumonia and has completed the course of antibiotic.The thought process was that there is possibility of autoimmune disease causing kidney disease and fever. Prednisone wasinitiated improvement in fever. Currently awaiting for outpatient hemodialysis arrangement.  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 Developed a perinephric hematoma post biopsy. CT scan of the abdomen pelvis from 03/16/2020 showed evolving left perinephric hematoma without acute hemorrhagic component. Status post IR guided internal jugular tunneled hemodialysis catheter on 03/09/2020. Awaiting for outpatient hemodialysis arrangement.  Cough, shortness of breath, dyspnea. Much improved. Completedvancomycin and cefepime course.T-max of 98.8F. Urine and blood cultures werenegative. Duplex ultrasound was negative for DVT. Continue incentive spirometry.   Fever. T-max of98.5. Possible autoimmune etiology. Continue prednisone. We will continue to monitor fever trend. CT scan of the abdomen pelvis with evolving left perinephric hematoma. CT scan of the chest was performed which showed mediastinal and axillary lymph nodes with decreasing bilateral pleural effusion.  Pleural effusion Continue hemodialysis for volume management.   Acute blood loss anemia Stable at this time. Continue to monitor hemoglobin every couple days. 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 Nephrology on board. Continue prednisone as per nephrology,plan to taper over 3 to 4 weeks  Seizure  Patient had seizure with lossof pulse and PEA arrest s/pCPR. Was seen by neurology. Plan is to continue Keppra on discharge  Perinephric hematoma Seen on CT abdomen/pelvis on 03/03/2020. Continue to monitor closely. Latest hemoglobin of 7.3Latest CT scan showing evolving hematoma.Check CBC in a.m.  Essential hypertension Continue with nifedipine, metoprolol and labetalol as needed. Improved blood pressure on metoprolol 75 mg twice daily. Continue the  same.  Uremia Resolved.   Normocytic anemia Status post kidney biopsy and perinephric hematoma. Status post 2 units of packed RBC on 03/03/2020. Continue Aranesp as  per nephrology. Recent hemoglobin of 7.0-stable  Acute on chronic respiratory failure with hypoxia Resolved. Status post PEA arrest. Extubated on 02/28/2020. Currently on room air  Cardiac arrest with pulseless electrical activity  Status post CPR and intubation. Stable at this time.  Debility, deconditioning,Will need home PT on discharge   DVT prophylaxis: SCDs Code Status: Full Family Communication: Son at bedside 3/21 Disposition Plan:  Status is: Inpatient  Remains inpatient appropriate because:IV treatments appropriate due to intensity of illness or inability to take PO and Inpatient level of care appropriate due to severity of illness   Dispo:  Patient From:  Home  Planned Disposition:  Home with home health  Medically stable for discharge:  No      Nutritional Assessment:  The patient's BMI is: Body mass index is 24.88 kg/m.Marland Kitchen  Seen by dietician.  I agree with the assessment and plan as outlined below:  Nutrition Status: Nutrition Problem: Increased nutrient needs Etiology: acute illness (renal failure requiring CRRT) Signs/Symptoms: estimated needs Interventions: Ensure Enlive (each supplement provides 350kcal and 20 grams of protein),MVI,Education,Liberalize Diet  Consultants:   Nephrology  ID  Procedures:   See below  Antimicrobials:  Anti-infectives (From admission, onward)   Start     Dose/Rate Route Frequency Ordered Stop   03/18/20 0900  ceFEPIme (MAXIPIME) 2 g in sodium chloride 0.9 % 100 mL IVPB        2 g 200 mL/hr over 30 Minutes Intravenous  Once 03/18/20 0751 03/18/20 0953   03/17/20 1200  vancomycin (VANCOCIN) IVPB 750 mg/150 ml premix        750 mg 150 mL/hr over 60 Minutes Intravenous Every T-Th-Sa (Hemodialysis) 03/14/20 1027 03/19/20 1158   03/14/20 1200   vancomycin (VANCOCIN) IVPB 1000 mg/200 mL premix        1,000 mg 200 mL/hr over 60 Minutes Intravenous  Once 03/14/20 1027 03/14/20 1641   03/14/20 1200  ceFEPIme (MAXIPIME) 2 g in sodium chloride 0.9 % 100 mL IVPB        2 g 200 mL/hr over 30 Minutes Intravenous Every T-Th-Sa (Hemodialysis) 03/14/20 1027 03/21/20 1159   03/14/20 0600  vancomycin (VANCOREADY) IVPB 1000 mg/200 mL        1,000 mg 200 mL/hr over 60 Minutes Intravenous To Short Stay 03/13/20 0712 03/13/20 0954   03/09/20 0600  vancomycin (VANCOREADY) IVPB 1000 mg/200 mL        1,000 mg 200 mL/hr over 60 Minutes Intravenous On call 03/06/20 1647 03/09/20 1542   03/06/20 1200  vancomycin (VANCOCIN) IVPB 750 mg/150 ml premix  Status:  Discontinued        750 mg 150 mL/hr over 60 Minutes Intravenous  Once 03/05/20 1426 03/05/20 1447   03/05/20 1800  vancomycin (VANCOCIN) IVPB 750 mg/150 ml premix  Status:  Discontinued        750 mg 150 mL/hr over 60 Minutes Intravenous  Once 03/05/20 0831 03/05/20 0834   03/05/20 0930  vancomycin (VANCOREADY) IVPB 1500 mg/300 mL  Status:  Discontinued        1,500 mg 150 mL/hr over 120 Minutes Intravenous  Once 03/05/20 0831 03/05/20 1447   03/05/20 0930  ceFEPIme (MAXIPIME) 1 g in sodium chloride 0.9 % 100 mL IVPB  Status:  Discontinued        1 g 200 mL/hr over 30 Minutes Intravenous Every 24 hours 03/05/20 0831 03/05/20 1447   02/29/20 1000  cefTRIAXone (ROCEPHIN) 2 g in sodium chloride 0.9 % 100 mL IVPB  Status:  Discontinued        2 g 200 mL/hr over 30 Minutes Intravenous Every 24 hours 02/29/20 0910 03/05/20 0756       Subjective: Patient seen and evaluated today with complaints of some ongoing fatigue with her seizure medications.  She is looking forward to discharge once hemodialysis has been arranged.  No significant fevers noted overnight.  Objective: Vitals:   03/22/20 0045 03/22/20 0547 03/22/20 1507 03/22/20 2117  BP: (!) 147/95 132/79 131/78 121/81  Pulse: 87 74 83 83   Resp: (!) 30 20 17 16   Temp: 97.8 F (36.6 C) 98.5 F (36.9 C) 98.5 F (36.9 C) 98.6 F (37 C)  TempSrc: Oral Oral  Oral  SpO2: 99% 98% 98% 100%  Weight:      Height:        Intake/Output Summary (Last 24 hours) at 03/23/2020 1158 Last data filed at 03/22/2020 1800 Gross per 24 hour  Intake 750 ml  Output -  Net 750 ml   Filed Weights   03/19/20 0500 03/19/20 0725 03/19/20 1124  Weight: 68.7 kg 66.3 kg 63.7 kg    Examination:  General exam: Appears calm and comfortable  Respiratory system: Clear to auscultation. Respiratory effort normal. Cardiovascular system: S1 & S2 heard, RRR.  Gastrointestinal system: Abdomen is soft Central nervous system: Alert and awake Extremities: No edema Skin: No significant lesions noted Psychiatry: Flat affect.    Data Reviewed: I have personally reviewed following labs and imaging studies  CBC: Recent Labs  Lab 03/18/20 0318 03/19/20 0348 03/21/20 0138 03/21/20 1800 03/23/20 0219  WBC 7.6 7.2 8.7 9.8 9.9  NEUTROABS  --   --   --   --  6.8  HGB 7.3* 7.0* 7.1* 7.3* 7.0*  HCT 22.3* 20.9* 21.0* 23.1* 22.4*  MCV 89.6 89.7 89.4 91.7 92.6  PLT 285 282 267 268 185   Basic Metabolic Panel: Recent Labs  Lab 03/17/20 1526 03/18/20 0318 03/19/20 0348 03/21/20 1800 03/23/20 0219  NA 131* 136 134* 132* 135  K 4.1 3.9 3.7 4.4 3.7  CL 95* 101 99 98 99  CO2 23 23 24 26 27   GLUCOSE 124* 92 93 139* 90  BUN 50* 22 38* 42* 23  CREATININE 10.40* 5.84* 7.92* 8.15* 5.92*  CALCIUM 7.7* 8.2* 7.9* 7.9* 8.3*  MG  --  1.9 1.9  --   --   PHOS 5.7* 3.8  --  3.6  --    GFR: Estimated Creatinine Clearance: 8.7 mL/min (A) (by C-G formula based on SCr of 5.92 mg/dL (H)). Liver Function Tests: Recent Labs  Lab 03/17/20 1526 03/18/20 0318 03/21/20 1800  AST  --  42*  --   ALT  --  29  --   ALKPHOS  --  42  --   BILITOT  --  0.7  --   PROT  --  6.6  --   ALBUMIN 1.9* 2.0* 1.8*   No results for input(s): LIPASE, AMYLASE in the last 168  hours. No results for input(s): AMMONIA in the last 168 hours. Coagulation Profile: No results for input(s): INR, PROTIME in the last 168 hours. Cardiac Enzymes: No results for input(s): CKTOTAL, CKMB, CKMBINDEX, TROPONINI in the last 168 hours. BNP (last 3 results) No results for input(s): PROBNP in the last 8760 hours. HbA1C: No results for input(s): HGBA1C in the last 72 hours. CBG: Recent Labs  Lab 03/22/20 1206 03/22/20 1545 03/22/20 2115 03/23/20 0840 03/23/20 1149  GLUCAP 90 110* 105* 146*  89   Lipid Profile: No results for input(s): CHOL, HDL, LDLCALC, TRIG, CHOLHDL, LDLDIRECT in the last 72 hours. Thyroid Function Tests: No results for input(s): TSH, T4TOTAL, FREET4, T3FREE, THYROIDAB in the last 72 hours. Anemia Panel: No results for input(s): VITAMINB12, FOLATE, FERRITIN, TIBC, IRON, RETICCTPCT in the last 72 hours. Sepsis Labs: No results for input(s): PROCALCITON, LATICACIDVEN in the last 168 hours.  Recent Results (from the past 240 hour(s))  Culture, blood (routine x 2)     Status: None   Collection Time: 03/13/20  9:35 PM   Specimen: BLOOD  Result Value Ref Range Status   Specimen Description BLOOD RIGHT ARM  Final   Special Requests   Final    BOTTLES DRAWN AEROBIC AND ANAEROBIC Blood Culture adequate volume   Culture   Final    NO GROWTH 5 DAYS Performed at [facility], [address] [place], [place] [address]    Report Status 03/18/2020 FINAL  Final  Culture, blood (routine x 2)     Status: None   Collection Time: 03/13/20  9:41 PM   Specimen: BLOOD  Result Value Ref Range Status   Specimen Description BLOOD RIGHT HAND  Final   Special Requests   Final    BOTTLES DRAWN AEROBIC AND ANAEROBIC Blood Culture adequate volume   Culture   Final    NO GROWTH 5 DAYS Performed at [facility], [place] [address] [place], [place] [address]    Report Status 03/18/2020 FINAL  Final  Culture, Urine     Status: None   Collection Time: 03/14/20   3:00 AM   Specimen: Urine, Random  Result Value Ref Range Status   Specimen Description URINE, RANDOM  Final   Special Requests NONE  Final   Culture   Final    NO GROWTH Performed at [facility], [place] [address], [place], [place] [address]    Report Status 03/15/2020 FINAL  Final         Radiology Studies: No results found.      Scheduled Meds: . chlorhexidine gluconate (MEDLINE KIT)  15 mL Mouth Rinse BID  . Chlorhexidine Gluconate Cloth  6 each Topical Q0600  . Chlorhexidine Gluconate Cloth  6 each Topical Q0600  . darbepoetin (ARANESP) injection - DIALYSIS  60 mcg Intravenous Q Tue-HD  . dextromethorphan  30 mg Oral BID  . docusate sodium  100 mg Oral BID  . feeding supplement  237 mL Oral TID BM  . levETIRAcetam  250 mg Oral Q T,Th,Sa-HD  . levETIRAcetam  500 mg Oral BID  . mouth rinse  15 mL Mouth Rinse BID  . metoprolol tartrate  75 mg Oral BID  . multivitamin  1 tablet Oral QHS  . NIFEdipine  90 mg Oral Daily  . predniSONE  30 mg Oral Q breakfast   Followed by  . [START ON 03/27/2020] predniSONE  20 mg Oral Q breakfast   Followed by  . [START ON 04/01/2020] predniSONE  10 mg Oral Q breakfast   Continuous Infusions: . sodium chloride 10 mL/hr at 03/14/20 1441     LOS: 27 days    Time spent: 35 minutes    Pratik D Manuella Ghazi, DO [facility]  If 7PM-7AM, please contact night-coverage [email or web address] 03/23/2020, 11:58 AM

## 2020-03-24 DIAGNOSIS — N179 Acute kidney failure, unspecified: Secondary | ICD-10-CM | POA: Diagnosis not present

## 2020-03-24 LAB — CBC
HCT: 21.1 % — ABNORMAL LOW (ref 36.0–46.0)
Hemoglobin: 6.9 g/dL — CL (ref 12.0–15.0)
MCH: 30.4 pg (ref 26.0–34.0)
MCHC: 32.7 g/dL (ref 30.0–36.0)
MCV: 93 fL (ref 80.0–100.0)
Platelets: 250 10*3/uL (ref 150–400)
RBC: 2.27 MIL/uL — ABNORMAL LOW (ref 3.87–5.11)
RDW: 16.3 % — ABNORMAL HIGH (ref 11.5–15.5)
WBC: 9.2 10*3/uL (ref 4.0–10.5)
nRBC: 0 % (ref 0.0–0.2)

## 2020-03-24 LAB — GLUCOSE, CAPILLARY
Glucose-Capillary: 78 mg/dL (ref 70–99)
Glucose-Capillary: 89 mg/dL (ref 70–99)
Glucose-Capillary: 102 mg/dL — ABNORMAL HIGH (ref 70–99)

## 2020-03-24 LAB — RENAL FUNCTION PANEL
Albumin: 1.9 g/dL — ABNORMAL LOW (ref 3.5–5.0)
Anion gap: 9 (ref 5–15)
BUN: 32 mg/dL — ABNORMAL HIGH (ref 8–23)
CO2: 26 mmol/L (ref 22–32)
Calcium: 8 mg/dL — ABNORMAL LOW (ref 8.9–10.3)
Chloride: 99 mmol/L (ref 98–111)
Creatinine, Ser: 8.38 mg/dL — ABNORMAL HIGH (ref 0.44–1.00)
GFR, Estimated: 5 mL/min — ABNORMAL LOW (ref >60)
Glucose, Bld: 90 mg/dL (ref 70–99)
Phosphorus: 3.7 mg/dL (ref 2.5–4.6)
Potassium: 3.8 mmol/L (ref 3.5–5.1)
Sodium: 134 mmol/L — ABNORMAL LOW (ref 135–145)

## 2020-03-24 LAB — PREPARE RBC (CROSSMATCH)

## 2020-03-24 MED ORDER — LIDOCAINE HCL (PF) 1 % IJ SOLN: 5.0000 mL | INTRAMUSCULAR | Status: DC | PRN

## 2020-03-24 MED ORDER — ALTEPLASE 2 MG IJ SOLR: 2.0000 mg | Freq: Once | INTRAMUSCULAR | Status: DC | PRN

## 2020-03-24 MED ORDER — SODIUM CHLORIDE 0.9 % IV SOLN: 100.0000 mL | INTRAVENOUS | Status: DC | PRN

## 2020-03-24 MED ORDER — SODIUM CHLORIDE 0.9% IV SOLUTION: Freq: Once | INTRAVENOUS | Status: AC

## 2020-03-24 MED ORDER — DARBEPOETIN ALFA 60 MCG/0.3ML IJ SOSY
PREFILLED_SYRINGE | INTRAMUSCULAR | Status: AC
  Administered 2020-03-24: 60 ug via INTRAVENOUS
  Filled 2020-03-24: qty 0.3

## 2020-03-24 MED ORDER — PENTAFLUOROPROP-TETRAFLUOROETH EX AERO: 1.0000 "application " | INHALATION_SPRAY | CUTANEOUS | Status: DC | PRN

## 2020-03-24 MED ORDER — HEPARIN SODIUM (PORCINE) 1000 UNIT/ML IJ SOLN
INTRAMUSCULAR | Status: AC
  Filled 2020-03-24: qty 4

## 2020-03-24 MED ORDER — LEVETIRACETAM 500 MG PO TABS
500.0000 mg | ORAL_TABLET | Freq: Every day | ORAL | Status: DC
  Administered 2020-03-24 – 2020-03-26 (×3): 250 mg via ORAL
  Administered 2020-03-27 – 2020-04-02 (×7): 500 mg via ORAL
  Filled 2020-03-24 (×12): qty 1

## 2020-03-24 MED ORDER — SODIUM CHLORIDE 0.9 % IV SOLN: 10.0000 mL/h | Freq: Once | INTRAVENOUS | Status: AC

## 2020-03-24 MED ORDER — DARBEPOETIN ALFA 100 MCG/0.5ML IJ SOSY
100.0000 ug | PREFILLED_SYRINGE | INTRAMUSCULAR | Status: DC
  Filled 2020-03-24: qty 0.5

## 2020-03-24 MED ORDER — HEPARIN SODIUM (PORCINE) 1000 UNIT/ML DIALYSIS: 1000.0000 [IU] | INTRAMUSCULAR | Status: DC | PRN

## 2020-03-24 NOTE — Progress Notes (Signed)
PROGRESS NOTE    Barbara Crawford  JGO:[id number] DOB: [birth year] DOA: 02/25/2020 PCP: Vonna Drafts, FNP   Brief Narrative:  Barbara Crawford is a 65 yo female with past medical history of hypertension, prior Covid (Dec 2021) presented to the hospital with complaints of lower extremity swelling, back pain, generalized malaise, fatigue at home. In the ED,she was found to have a hemoglobin of 6.3, potassium 5.4, sodium 130, bicarb 9, BUN 144, and creatinine 29.98. CT of the abdomen and pelvis was negative for obstruction or acute abnormality. Temporary hemodialysis catheter was placed by IR and subsequently, the patient had a PEA arrest associated with seizure-like activity. She was intubated and subsequently extubated 02/28/2020. 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 Renal biopsy done 03/03/2019. ANA positive. ID followed the patient for fever.Trialysis cath was removed on 03/06/20 and subsequently underwent permacath placement.  During hospitalization, patient continued to have fever so ID wasreconsulted.Extensive work-up was done in the past for fever. Recent x-ray showed consolidation in the lungs. Patient was started on vancomycin and cefepime for possible healthcare associated pneumonia and has completed the course of antibiotic.The thought process was that there is possibility of autoimmune disease causing kidney disease and fever. Prednisone wasinitiated improvement in fever. Currently awaiting for outpatient hemodialysis arrangement.  She was noted to have slightly lower hemoglobin levels on 3/22 requiring 1 unit PRBC transfusion.  No overt bleeding identified.   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 Developed a perinephric hematoma post biopsy. CT scan of the abdomen pelvis from 03/16/2020 showed evolving left perinephric hematoma without acute hemorrhagic component. Status post IR guided internal jugular tunneled hemodialysis catheter on 03/09/2020. Awaiting for outpatient hemodialysis arrangement.  Cough, shortness of breath, dyspnea-resolved. Completedvancomycin and cefepime course.T-max of 98.26F. Urine and blood cultures werenegative. Duplex ultrasound was negative for DVT. Continue incentive spirometry.   Fever-resolved. T-max of98.5. Possible autoimmune etiology. Continue prednisone. We will continue to monitor fever trend. CT scan of the abdomen pelvis with evolving left perinephric hematoma. CT scan of the chest was performed which showed mediastinal and axillary lymph nodes with decreasing bilateral pleural effusion.  Pleural effusion Continue hemodialysis for volume management.   Normocytic anemia-slowly downtrending Likely related to anemia of chronic kidney disease.  Status post kidney biopsy and perinephric hematoma.  Status post 2 units of packed RBC on 03/03/2020.  Continue Aranesp per nephrology.  Hemoglobin has slowly down trended to 6.9 today, plan to transfuse 1 unit PRBC and follow-up CBC in a.m.  No overt bleeding identified.  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 Nephrology on board. Continue prednisone as per nephrology,plan to taper over 3 to 4 weeks  Seizure on admission Patient had seizure with lossof pulse and PEA arrest s/pCPR. Was seen by neurology. Plan is to  continue Keppra on discharge.  No further seizure episodes have been noted while inpatient.  Keppra has  been changed to 500 mg daily and 250 mg after dialysis sessions.  This is a decrease of 500 mg daily as this was causing a great deal of somnolence.  Perinephric hematoma Seen on CT abdomen/pelvis on 03/03/2020. Continue to monitor closely. Latest hemoglobin of 7.3Latest CT scan showing evolving hematoma.Check CBC in a.m.  Essential hypertension Continue with nifedipine, metoprolol and labetalol as needed. Improved blood pressure on metoprolol 75 mg twice daily. Continue the same.  Uremia Resolved.   Acute on chronic respiratory failure with hypoxia Resolved. Status post PEA arrest. Extubated on 02/28/2020. Currently on room air  Cardiac arrest with pulseless electrical activity  Status post CPR and intubation. Stable at this time.  Debility, deconditioning,Will need home PT on discharge   DVT prophylaxis: SCDs Code Status: Full Family Communication: Son at bedside 3/21, pt will call on 3/22 Disposition Plan:  Status is: Inpatient  Remains inpatient appropriate because:IV treatments appropriate due to intensity of illness or inability to take PO and Inpatient level of care appropriate due to severity of illness   Dispo:             Patient From:  Home             Planned Disposition:  Home with home health             Medically stable for discharge:  No                 Nutritional Assessment:  The patients BMI is: Body mass index is 24.88 kg/m.Marland Kitchen  Seen by dietician.  I agree with the assessment and plan as outlined below:  Nutrition Status: Nutrition Problem: Increased nutrient needs Etiology: acute illness (renal failure requiring CRRT) Signs/Symptoms: estimated needs Interventions: Ensure Enlive (each supplement provides 350kcal and 20 grams of protein),MVI,Education,Liberalize Diet  Consultants:   Nephrology  ID  Procedures:    See below  Antimicrobials:  Anti-infectives (From admission, onward)   Start     Dose/Rate Route Frequency Ordered Stop   03/18/20 0900  ceFEPIme (MAXIPIME) 2 g in sodium chloride 0.9 % 100 mL IVPB        2 g 200 mL/hr over 30 Minutes Intravenous  Once 03/18/20 0751 03/18/20 0953   03/17/20 1200  vancomycin (VANCOCIN) IVPB 750 mg/150 ml premix        750 mg 150 mL/hr over 60 Minutes Intravenous Every T-Th-Sa (Hemodialysis) 03/14/20 1027 03/19/20 1158   03/14/20 1200  vancomycin (VANCOCIN) IVPB 1000 mg/200 mL premix        1,000 mg 200 mL/hr over 60 Minutes Intravenous  Once 03/14/20 1027 03/14/20 1641   03/14/20 1200  ceFEPIme (MAXIPIME) 2 g in sodium chloride 0.9 % 100 mL IVPB        2 g 200 mL/hr over 30 Minutes Intravenous Every T-Th-Sa (Hemodialysis) 03/14/20 1027 03/21/20 1159   03/14/20 0600  vancomycin (VANCOREADY) IVPB 1000 mg/200 mL        1,000 mg 200 mL/hr over 60 Minutes Intravenous To Short Stay 03/13/20 0712 03/13/20 0954   03/09/20 0600  vancomycin (VANCOREADY) IVPB 1000 mg/200 mL        1,000 mg 200 mL/hr over 60 Minutes Intravenous On call 03/06/20 1647 03/09/20 1542   03/06/20 1200  vancomycin (VANCOCIN) IVPB 750 mg/150 ml premix  Status:  Discontinued        750 mg 150 mL/hr over 60 Minutes Intravenous  Once 03/05/20 1426 03/05/20 1447   03/05/20 1800  vancomycin (VANCOCIN) IVPB 750 mg/150  ml premix  Status:  Discontinued        750 mg 150 mL/hr over 60 Minutes Intravenous  Once 03/05/20 0831 03/05/20 0834   03/05/20 0930  vancomycin (VANCOREADY) IVPB 1500 mg/300 mL  Status:  Discontinued        1,500 mg 150 mL/hr over 120 Minutes Intravenous  Once 03/05/20 0831 03/05/20 1447   03/05/20 0930  ceFEPIme (MAXIPIME) 1 g in sodium chloride 0.9 % 100 mL IVPB  Status:  Discontinued        1 g 200 mL/hr over 30 Minutes Intravenous Every 24 hours 03/05/20 0831 03/05/20 1447   02/29/20 1000  cefTRIAXone (ROCEPHIN) 2 g in sodium chloride 0.9 % 100 mL IVPB  Status:   Discontinued        2 g 200 mL/hr over 30 Minutes Intravenous Every 24 hours 02/29/20 0910 03/05/20 0756       Subjective: Patient seen and evaluated today with no new acute complaints or concerns. No acute concerns or events noted overnight.  Patient is currently in hemodialysis and is quite tearful and would like to leave the hospital once she has hemodialysis arranged.  Objective: Vitals:   03/24/20 0946 03/24/20 1000 03/24/20 1001 03/24/20 1016  BP: 105/62 113/72 113/72 118/77  Pulse:      Resp:   (!) 25 (!) 22  Temp: 98.6 F (37 C)  98.7 F (37.1 C) 98.6 F (37 C)  TempSrc:   Oral Oral  SpO2:      Weight:      Height:       No intake or output data in the 24 hours ending 03/24/20 1044 Filed Weights   03/19/20 0725 03/19/20 1124 03/24/20 0630  Weight: 66.3 kg 63.7 kg 64.2 kg    Examination:  General exam: Appears calm and comfortable  Respiratory system: Clear to auscultation. Respiratory effort normal. Cardiovascular system: S1 & S2 heard, RRR.  Gastrointestinal system: Abdomen is soft Central nervous system: Alert and awake Extremities: No edema Skin: No significant lesions noted Psychiatry: Flat affect, tearful    Data Reviewed: I have personally reviewed following labs and imaging studies  CBC: Recent Labs  Lab 03/19/20 0348 03/21/20 0138 03/21/20 1800 03/23/20 0219 03/24/20 0621  WBC 7.2 8.7 9.8 9.9 9.2  NEUTROABS  --   --   --  6.8  --   HGB 7.0* 7.1* 7.3* 7.0* 6.9*  HCT 20.9* 21.0* 23.1* 22.4* 21.1*  MCV 89.7 89.4 91.7 92.6 93.0  PLT 282 267 268 225 163   Basic Metabolic Panel: Recent Labs  Lab 03/17/20 1526 03/18/20 0318 03/19/20 0348 03/21/20 1800 03/23/20 0219 03/24/20 0621  NA 131* 136 134* 132* 135 134*  K 4.1 3.9 3.7 4.4 3.7 3.8  CL 95* 101 99 98 99 99  CO2 23 23 24 26 27 26   GLUCOSE 124* 92 93 139* 90 90  BUN 50* 22 38* 42* 23 32*  CREATININE 10.40* 5.84* 7.92* 8.15* 5.92* 8.38*  CALCIUM 7.7* 8.2* 7.9* 7.9* 8.3* 8.0*  MG   --  1.9 1.9  --   --   --   PHOS 5.7* 3.8  --  3.6  --  3.7   GFR: Estimated Creatinine Clearance: 6.2 mL/min (A) (by C-G formula based on SCr of 8.38 mg/dL (H)). Liver Function Tests: Recent Labs  Lab 03/17/20 1526 03/18/20 0318 03/21/20 1800 03/24/20 0621  AST  --  42*  --   --   ALT  --  29  --   --  ALKPHOS  --  42  --   --   BILITOT  --  0.7  --   --   PROT  --  6.6  --   --   ALBUMIN 1.9* 2.0* 1.8* 1.9*   No results for input(s): LIPASE, AMYLASE in the last 168 hours. No results for input(s): AMMONIA in the last 168 hours. Coagulation Profile: No results for input(s): INR, PROTIME in the last 168 hours. Cardiac Enzymes: No results for input(s): CKTOTAL, CKMB, CKMBINDEX, TROPONINI in the last 168 hours. BNP (last 3 results) No results for input(s): PROBNP in the last 8760 hours. HbA1C: No results for input(s): HGBA1C in the last 72 hours. CBG: Recent Labs  Lab 03/22/20 2115 03/23/20 0840 03/23/20 1149 03/23/20 1728 03/23/20 2100  GLUCAP 105* 146* 89 96 105*   Lipid Profile: No results for input(s): CHOL, HDL, LDLCALC, TRIG, CHOLHDL, LDLDIRECT in the last 72 hours. Thyroid Function Tests: No results for input(s): TSH, T4TOTAL, FREET4, T3FREE, THYROIDAB in the last 72 hours. Anemia Panel: No results for input(s): VITAMINB12, FOLATE, FERRITIN, TIBC, IRON, RETICCTPCT in the last 72 hours. Sepsis Labs: No results for input(s): PROCALCITON, LATICACIDVEN in the last 168 hours.  No results found for this or any previous visit (from the past 240 hour(s)).       Radiology Studies: No results found.      Scheduled Meds:  sodium chloride   Intravenous Once   chlorhexidine gluconate (MEDLINE KIT)  15 mL Mouth Rinse BID   Chlorhexidine Gluconate Cloth  6 each Topical Q0600   Chlorhexidine Gluconate Cloth  6 each Topical Q0600   darbepoetin (ARANESP) injection - DIALYSIS  60 mcg Intravenous Q Tue-HD   dextromethorphan  30 mg Oral BID   docusate sodium   100 mg Oral BID   feeding supplement  237 mL Oral TID BM   heparin sodium (porcine)       levETIRAcetam  250 mg Oral Q T,Th,Sa-HD   levETIRAcetam  500 mg Oral Daily   mouth rinse  15 mL Mouth Rinse BID   metoprolol tartrate  75 mg Oral BID   multivitamin  1 tablet Oral QHS   NIFEdipine  90 mg Oral Daily   predniSONE  30 mg Oral Q breakfast   Followed by   Derrill Memo ON 03/27/2020] predniSONE  20 mg Oral Q breakfast   Followed by   Derrill Memo ON 04/01/2020] predniSONE  10 mg Oral Q breakfast   Continuous Infusions:  sodium chloride 10 mL/hr at 03/14/20 1441   sodium chloride     sodium chloride     sodium chloride       LOS: 28 days    Time spent: 35 minutes    Malana Eberwein Darleen Crocker, DO [facility]  If 7PM-7AM, please contact night-coverage [email or web address] 03/24/2020, 10:44 AM

## 2020-03-24 NOTE — Progress Notes (Signed)
Renal Navigator received call from [employer] with [facility] who had been asked to contact Navigator by K. Atkins/Admissions Coordinator. She states patient will have more options with [facility] or [facility] with the [employer] "[employer]" plan. She offered to speak with patient and Navigator called patient to inform that Scientist, clinical (histocompatibility and immunogenetics) would be calling her. Patient states she is feeling very tired from HD this morning. Navigator asked patient to tell Scientist, clinical (histocompatibility and immunogenetics) a better time to talk if she is too tired now. Patient is appreciative as usual.   Alphonzo Cruise, [place] [facility]al Navigator [phone]

## 2020-03-24 NOTE — Procedures (Signed)
I was present at this dialysis session. I have reviewed the session itself and made appropriate changes.   Filed Weights   03/19/20 1124 03/24/20 0630 03/24/20 1043  Weight: 63.7 kg 64.2 kg 61.2 kg    Recent Labs  Lab 03/24/20 0621  NA 134*  K 3.8  CL 99  CO2 26  GLUCOSE 90  BUN 32*  CREATININE 8.38*  CALCIUM 8.0*  PHOS 3.7    Recent Labs  Lab 03/21/20 1800 03/23/20 0219 03/24/20 0621  WBC 9.8 9.9 9.2  NEUTROABS  --  6.8  --   HGB 7.3* 7.0* 6.9*  HCT 23.1* 22.4* 21.1*  MCV 91.7 92.6 93.0  PLT 268 225 250    Scheduled Meds: . sodium chloride   Intravenous Once  . chlorhexidine gluconate (MEDLINE KIT)  15 mL Mouth Rinse BID  . Chlorhexidine Gluconate Cloth  6 each Topical Q0600  . Chlorhexidine Gluconate Cloth  6 each Topical Q0600  . darbepoetin (ARANESP) injection - DIALYSIS  100 mcg Intravenous Q Tue-HD  . dextromethorphan  30 mg Oral BID  . docusate sodium  100 mg Oral BID  . feeding supplement  237 mL Oral TID BM  . heparin sodium (porcine)      . levETIRAcetam  250 mg Oral Q T,Th,Sa-HD  . levETIRAcetam  500 mg Oral Daily  . mouth rinse  15 mL Mouth Rinse BID  . metoprolol tartrate  75 mg Oral BID  . multivitamin  1 tablet Oral QHS  . NIFEdipine  90 mg Oral Daily  . predniSONE  30 mg Oral Q breakfast   Followed by  . [START ON 03/27/2020] predniSONE  20 mg Oral Q breakfast   Followed by  . [START ON 04/01/2020] predniSONE  10 mg Oral Q breakfast   Continuous Infusions: . sodium chloride 10 mL/hr at 03/14/20 1441  . sodium chloride    . sodium chloride    . sodium chloride     PRN Meds:.sodium chloride, sodium chloride, acetaminophen, alteplase, guaiFENesin, heparin, labetalol, lidocaine (PF), LORazepam, melatonin, menthol-cetylpyridinium, ondansetron **OR** ondansetron (ZOFRAN) IV, oxyCODONE, pentafluoroprop-tetrafluoroeth   Gean Quint, MD [facility] 03/24/2020, 10:57 AM

## 2020-03-24 NOTE — Progress Notes (Signed)
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  Continue dialysis on TTS schedule with process underway for outpatient dialysis unit placement (this has been difficult due to the unique insurance coverage that the patient has).   -She had creation of a left brachiocephalic fistula by Dr. Carlis Abbott on 3/11 and is currently getting hemodialysis via a right IJ tunneled hemodialysis catheter. -tolerating HD today, next treatment thursday  # Suspected healthcare associated pneumonia: On intravenous cefepime and vancomycin and remains intermittently febrile.  She has fever of unknown origin that has been worked up with imaging/cultures.  Was started her on prednisone for suspected autoimmune mechanism associated fevers, per primary.  # Anemia: Likely from underlying chronic disease and surgical losses.  Ferritin level very high. Continue Aranesp and transfuse as needed. Will increase dose to 151mg qtuesdays  # Hypertension: Blood pressure acceptable. LE  edema manage with dialysis/Ultrafiltration.  # Secondary hyperparathyroidism: Calcium and phosphorus under acceptable control, not on binders at this time.  PTH level was 193 on 2/23-at goal for ESRD.  # Hyponatremia: Improved. Managed with dialysis, continue fluid restriction.  Subjective: Seen and examined at bedside in HD unit. reciving 1u prbc for hgb 6.9. tolerating treatment thus far. Eager to go home. Objective Vital signs in last 24 hours: Vitals:   03/24/20 1001 03/24/20 1016 03/24/20 1043 03/24/20 1044  BP: 113/72 118/77 123/73 119/72  Pulse:      Resp: (!) 25 (!) 22 (!) 24 (!) 29  Temp: 98.7 F (37.1 C) 98.6 F (37 C)  98.6 F (37 C)  TempSrc: Oral Oral  Oral  SpO2:   99%   Weight:   61.2 kg   Height:        Weight change:   Intake/Output Summary (Last 24 hours) at 03/24/2020 1054 Last data filed at 03/24/2020 1044 Gross per 24 hour  Intake -  Output 2850 ml  Net -2850 ml       Labs: Basic Metabolic Panel: Recent Labs  Lab 03/18/20 0318 03/19/20 0348 03/21/20 1800 03/23/20 0219 03/24/20 0621  NA 136   < > 132* 135 134*  K 3.9   < > 4.4 3.7 3.8  CL 101   < > 98 99 99  CO2 23   < > 26 27 26   GLUCOSE 92   < > 139* 90 90  BUN 22   < > 42* 23 32*  CREATININE 5.84*   < > 8.15* 5.92* 8.38*  CALCIUM 8.2*   < > 7.9* 8.3* 8.0*  PHOS 3.8  --  3.6  --  3.7   < > = values in this interval not displayed.   Liver Function Tests: Recent Labs  Lab 03/18/20 0318 03/21/20 1800 03/24/20 0621  AST 42*  --   --   ALT 29  --   --   ALKPHOS 42  --   --   BILITOT 0.7  --   --   PROT 6.6  --   --   ALBUMIN 2.0* 1.8* 1.9*   No results for input(s): LIPASE, AMYLASE in the last 168 hours. No results for input(s): AMMONIA in the last 168 hours. CBC: Recent Labs  Lab 03/19/20 0348 03/21/20 0138 03/21/20 1800 03/23/20 0219 03/24/20 0621  WBC 7.2 8.7 9.8 9.9 9.2  NEUTROABS  --   --   --  6.8  --   HGB 7.0* 7.1* 7.3* 7.0* 6.9*  HCT 20.9* 21.0* 23.1* 22.4* 21.1*  MCV 89.7 89.4 91.7 92.6 93.0  PLT 282 267 268 225 250   Cardiac Enzymes: No results for input(s): CKTOTAL, CKMB, CKMBINDEX, TROPONINI in the last 168 hours. CBG: Recent Labs  Lab 03/22/20 2115 03/23/20 0840 03/23/20 1149 03/23/20 1728 03/23/20 2100  GLUCAP 105* 146* 89 96 105*    Iron Studies: No results for input(s): IRON, TIBC, TRANSFERRIN, FERRITIN in the last 72 hours. Studies/Results: No results found.  Medications: Infusions: . sodium chloride 10 mL/hr at 03/14/20 1441  . sodium chloride    . sodium chloride    . sodium chloride      Scheduled Medications: . sodium chloride   Intravenous Once  . chlorhexidine gluconate (MEDLINE KIT)  15 mL Mouth Rinse BID  . Chlorhexidine Gluconate Cloth  6 each  Topical Q0600  . Chlorhexidine Gluconate Cloth  6 each Topical Q0600  . darbepoetin (ARANESP) injection - DIALYSIS  60 mcg Intravenous Q Tue-HD  . dextromethorphan  30 mg Oral BID  . docusate sodium  100 mg Oral BID  . feeding supplement  237 mL Oral TID BM  . heparin sodium (porcine)      . levETIRAcetam  250 mg Oral Q T,Th,Sa-HD  . levETIRAcetam  500 mg Oral Daily  . mouth rinse  15 mL Mouth Rinse BID  . metoprolol tartrate  75 mg Oral BID  . multivitamin  1 tablet Oral QHS  . NIFEdipine  90 mg Oral Daily  . predniSONE  30 mg Oral Q breakfast   Followed by  . [START ON 03/27/2020] predniSONE  20 mg Oral Q breakfast   Followed by  . [START ON 04/01/2020] predniSONE  10 mg Oral Q breakfast    have reviewed scheduled and prn medications.  Physical Exam: General:NAD Heart:RRR, s1s2 nl Lungs:clear b/l, no w/r/r/c, unlabored, bl chest expansion Abdomen:soft, Non-tender, non-distended Extremities: Bilateral LE edema present+ Dialysis Access: Right IJ TDC, site clean.  Vikas Singh 03/24/2020,10:54 AM  LOS: 28 days

## 2020-03-25 DIAGNOSIS — Z992 Dependence on renal dialysis: Secondary | ICD-10-CM

## 2020-03-25 DIAGNOSIS — I469 Cardiac arrest, cause unspecified: Secondary | ICD-10-CM | POA: Diagnosis not present

## 2020-03-25 DIAGNOSIS — R509 Fever, unspecified: Secondary | ICD-10-CM | POA: Diagnosis not present

## 2020-03-25 DIAGNOSIS — N186 End stage renal disease: Secondary | ICD-10-CM

## 2020-03-25 DIAGNOSIS — J9621 Acute and chronic respiratory failure with hypoxia: Secondary | ICD-10-CM | POA: Diagnosis not present

## 2020-03-25 DIAGNOSIS — N179 Acute kidney failure, unspecified: Secondary | ICD-10-CM | POA: Diagnosis not present

## 2020-03-25 LAB — BASIC METABOLIC PANEL
Anion gap: 9 (ref 5–15)
BUN: 17 mg/dL (ref 8–23)
CO2: 27 mmol/L (ref 22–32)
Calcium: 8.1 mg/dL — ABNORMAL LOW (ref 8.9–10.3)
Chloride: 97 mmol/L — ABNORMAL LOW (ref 98–111)
Creatinine, Ser: 5.38 mg/dL — ABNORMAL HIGH (ref 0.44–1.00)
GFR, Estimated: 8 mL/min — ABNORMAL LOW (ref >60)
Glucose, Bld: 91 mg/dL (ref 70–99)
Potassium: 3.8 mmol/L (ref 3.5–5.1)
Sodium: 133 mmol/L — ABNORMAL LOW (ref 135–145)

## 2020-03-25 LAB — GLUCOSE, CAPILLARY
Glucose-Capillary: 83 mg/dL (ref 70–99)
Glucose-Capillary: 101 mg/dL — ABNORMAL HIGH (ref 70–99)
Glucose-Capillary: 112 mg/dL — ABNORMAL HIGH (ref 70–99)
Glucose-Capillary: 113 mg/dL — ABNORMAL HIGH (ref 70–99)

## 2020-03-25 LAB — CBC
HCT: 28.7 % — ABNORMAL LOW (ref 36.0–46.0)
Hemoglobin: 9.4 g/dL — ABNORMAL LOW (ref 12.0–15.0)
MCH: 29.9 pg (ref 26.0–34.0)
MCHC: 32.8 g/dL (ref 30.0–36.0)
MCV: 91.4 fL (ref 80.0–100.0)
Platelets: 188 10*3/uL (ref 150–400)
RBC: 3.14 MIL/uL — ABNORMAL LOW (ref 3.87–5.11)
RDW: 16 % — ABNORMAL HIGH (ref 11.5–15.5)
WBC: 8.7 10*3/uL (ref 4.0–10.5)
nRBC: 0 % (ref 0.0–0.2)

## 2020-03-25 LAB — MAGNESIUM: Magnesium: 1.8 mg/dL (ref 1.7–2.4)

## 2020-03-25 MED ORDER — BOOST / RESOURCE BREEZE PO LIQD CUSTOM
1.0000 | Freq: Three times a day (TID) | ORAL | Status: DC
  Administered 2020-03-25 – 2020-03-30 (×8): 1 via ORAL

## 2020-03-25 MED ORDER — DIPHENHYDRAMINE-ZINC ACETATE 2-0.1 % EX CREA
TOPICAL_CREAM | Freq: Two times a day (BID) | CUTANEOUS | Status: DC | PRN
  Filled 2020-03-25: qty 28

## 2020-03-25 NOTE — Progress Notes (Signed)
Physical Therapy Treatment Patient Details Name: Annell Mcnichol MRN: [id number] DOB: March 28, 1955 Today's Date: 03/25/2020    History of Present Illness 65 y.o. female with medical history significant of HTN. Pt had COVID in Dec, 1 week prior to presenting to ED 02/25/20 she developed swelling in BLE. Developed pain in B lower back; CT abd/pelvis - no obstruction or acute abnormality. HGB 6.3, +acute renal failure; 2/23 seizure and Code Blue with CPR x 4 minutes and intubated 2/23-2/25; CRRT; renal biopsy 2/28 with perinephric hematoma; pneumonia;  IJ catheter placed and HD initiated. Plans to have dialysis access placed 3/11    PT Comments    Patient willing to leave her room for stair training in stairwell. Patient required incr time and minguard assist to ascend 10 steps with rt rail. Patient with seated rest break prior to descending stairs with min assist (rail plus HHA). Patient acknowledges that she is more fatigued after dialysis, and points out she feels more secure ascending steps than descending (she will be ascending on return to home from dialysis). She plans to have her son present when she needs to go up/down until she feels confident. Will attempt stairs after dialysis for comparison (if able to see her after HD session on 3/24).     Follow Up Recommendations  Home health PT;Supervision - Intermittent     Equipment Recommendations  Rolling walker with 5" wheels    Recommendations for Other Services       Precautions / Restrictions Precautions Precautions: Fall    Mobility  Bed Mobility                    Transfers Overall transfer level: Needs assistance Equipment used: None Transfers: Sit to/from Stand Sit to Stand: Supervision         General transfer comment: Supervision A for safety due to "fuzzy feeling from meds."  Ambulation/Gait Ambulation/Gait assistance: Min guard Gait Distance (Feet): 20 Feet Assistive device: None Gait  Pattern/deviations: Step-through pattern;Decreased stride length;Wide base of support Gait velocity: very slow   General Gait Details: to/from transport chair and in room on return   Stairs   Stairs assistance: Min assist Stair Management: One rail Right;Step to pattern;Forwards Number of Stairs: 10 (in stairway) General stair comments: seated rest at top of stairs prior to descending   Wheelchair Mobility    Modified Rankin (Stroke Patients Only)       Balance Overall balance assessment: Modified Independent Sitting-balance support: Feet supported Sitting balance-Leahy Scale: Good     Standing balance support: No upper extremity supported;During functional activity Standing balance-Leahy Scale: Good Standing balance comment: reaching outside BOS and carrying objects while walking in room                            Cognition Arousal/Alertness: Awake/alert Behavior During Therapy: WFL for tasks assessed/performed Overall Cognitive Status: Within Functional Limits for tasks assessed Area of Impairment: Problem solving                 Orientation Level:  (NT)           Problem Solving: Slow processing General Comments: takes extra time to think through what she needs to do; not as delayed as previous days      Exercises      General Comments        Pertinent Vitals/Pain Pain Assessment: No/denies pain    Home Living  Prior Function            PT Goals (current goals can now be found in the care plan section) Acute Rehab PT Goals Patient Stated Goal: To return home. PT Goal Formulation: With patient Time For Goal Achievement: 04/06/20 Potential to Achieve Goals: Good Progress towards PT goals: Progressing toward goals    Frequency    Min 3X/week      PT Plan Current plan remains appropriate    Co-evaluation              AM-PAC PT "6 Clicks" Mobility   Outcome Measure  Help needed  turning from your back to your side while in a flat bed without using bedrails?: None Help needed moving from lying on your back to sitting on the side of a flat bed without using bedrails?: A Little Help needed moving to and from a bed to a chair (including a wheelchair)?: A Little Help needed standing up from a chair using your arms (e.g., wheelchair or bedside chair)?: A Little Help needed to walk in hospital room?: A Little Help needed climbing 3-5 steps with a railing? : A Little 6 Click Score: 19    End of Session Equipment Utilized During Treatment: Gait belt Activity Tolerance: Patient tolerated treatment well Patient left: in chair;with call bell/phone within reach Nurse Communication: Mobility status PT Visit Diagnosis: Muscle weakness (generalized) (M62.81)     Time: [id number] PT Time Calculation (min) (ACUTE ONLY): 32 min  Charges:  $Gait Training: 23-37 mins                      Arby Barrette, PT Pager [phone]    [name] 03/25/2020, 10:57 AM

## 2020-03-25 NOTE — Progress Notes (Signed)
Pt only requests to take half dose ('250mg'$ ) of keppra. Pt states, "I don't like the way this medicine makes me feel. It makes me feel weird and almost dizzy like. I've told the doctor that and he said he was going to change it." Pt informed of the ordered dosages and also made aware of pt's rights. Pt given half dose ('250mg'$ ) of keppra per pt request. Pt informed that this RN would notate in the chart to reflect the given dose. Pt also presents with a new rash which she states formed after receiving CHG bath with wipes. Pt requesting benadryl cream. Hospitalist paged, new orders received. Will continue to monitor pt.

## 2020-03-25 NOTE — Progress Notes (Signed)
Patient ID: Barbara Crawford, female   DOB: [birth year], 65 y.o.   MRN: [id number]  PROGRESS NOTE    Lylla Eifler  [id number] DOB: September 22, 1955 DOA: 02/25/2020 PCP: Vonna Drafts, FNP   Brief Narrative:  65 yo female with past medical history of hypertension, prior Covid (Dec 2021) presented to the hospital with complaints of lower extremity swelling, back pain, generalized malaise, fatigue at home. In the ED,she was found to have a hemoglobin of 6.3, potassium 5.4, sodium 130, bicarb 9, BUN 144, and creatinine 29.98. CT of the abdomen and pelvis was negative for obstruction or acute abnormality. Temporary hemodialysis catheter was placed by IR and subsequently, the patient had a PEA arrest associated with seizure-like activity. She was intubated and subsequently extubated on 02/28/2020. 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 Renal biopsy was done 03/03/2019. ANA positive. ID followed the patient for fever. Dialysis cath was removed on 03/06/20 and subsequently underwent permacath placement.  During the hospitalization, patient continued to have fever so ID wasreconsulted.Extensive work-up was done. Recent x-ray had shown consolidation in the lungs. Patient was started on vancomycin and cefepime for possible healthcare associated pneumonia and subsequentlycompleted the course of antibiotic.The thought process was that there was possibility of autoimmune disease causing kidney disease and fever. She was treated with prednisone with improvement in fever.   Currently awaiting for outpatient hemodialysis arrangement.  She was noted to have slightly lower hemoglobin level on 03/24/20 requiring 1 unit PRBC transfusion.  No overt bleeding identified.  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 CT scan of the abdomen pelvis from 03/16/2020 showed evolving left perinephric hematoma without acute hemorrhagic component.  -Status post IR guided internal jugular tunneled hemodialysis catheter on 03/09/2020 -Nephrology is following and dialysis as per nephrology schedule.  Awaiting outpatient hemodialysis unit arrangement  Fever/possible healthcare associated pneumonia: Resolved -Has completed course of vancomycin and cefepime.  Blood/urine cultures have been negative so far -Duplex ultrasound was negative for DVT. -Continue incentive spirometry -Possible autoimmune etiology of fever as well. CT scan of the abdomen pelvis with evolving left perinephric hematoma. CT scan of the chest was performed which showed mediastinal and axillary lymph nodes with decreasing bilateral pleural effusion. -Continue tapering doses of prednisone  Anemia of chronic disease -Status post 3 units of packed red cells transfusion during this hospitalization; last transfusion was on 03/24/2020 for hemoglobin of 6.9.  Hemoglobin 9.4 today.  Monitor  Seizure -Patient had seizure with loss of pulse and PEA arrest status post CPR.  Patient was seen by neurology during his hospitalization. -No further seizures while inpatient.  Continue current doses of Keppra.  Outpatient follow-up with neurology  Essential hypertension -Blood pressure stable.  Continue nifedipine, metoprolol  Acute on chronic respiratory failure with hypoxia -Resolved.  Extubated on 02/28/2020.  Currently on room air  Cardiac arrest with pulseless electrical activity -Status post CPR and intubation.  Stable at this time  Debility/deconditioning -Will need home health PT on discharge      DVT prophylaxis: SCDs Code Status: Full Family Communication: None at bedside Disposition Plan: Status is: Inpatient  Remains  inpatient appropriate because:Inpatient level of care appropriate due to severity of illness   Dispo:  Patient From: Home  Planned Disposition: Home with home health as soon as  outpatient hemodialysis unit is arranged  Medically stable for discharge: Yes   Consultants: PCCM/nephrology/IR/ID/vascular surgery/neurology  Procedures: Intubation/extubation Renal biopsy IR guided tunneled dialysis catheter placement  Antimicrobials:  Anti-infectives (From admission, onward)   Start     Dose/Rate Route Frequency Ordered Stop   03/18/20 0900  ceFEPIme (MAXIPIME) 2 g in sodium chloride 0.9 % 100 mL IVPB        2 g 200 mL/hr over 30 Minutes Intravenous  Once 03/18/20 0751 03/18/20 0953   03/17/20 1200  vancomycin (VANCOCIN) IVPB 750 mg/150 ml premix        750 mg 150 mL/hr over 60 Minutes Intravenous Every T-Th-Sa (Hemodialysis) 03/14/20 1027 03/19/20 1158   03/14/20 1200  vancomycin (VANCOCIN) IVPB 1000 mg/200 mL premix        1,000 mg 200 mL/hr over 60 Minutes Intravenous  Once 03/14/20 1027 03/14/20 1641   03/14/20 1200  ceFEPIme (MAXIPIME) 2 g in sodium chloride 0.9 % 100 mL IVPB        2 g 200 mL/hr over 30 Minutes Intravenous Every T-Th-Sa (Hemodialysis) 03/14/20 1027 03/21/20 1159   03/14/20 0600  vancomycin (VANCOREADY) IVPB 1000 mg/200 mL        1,000 mg 200 mL/hr over 60 Minutes Intravenous To Short Stay 03/13/20 0712 03/13/20 0954   03/09/20 0600  vancomycin (VANCOREADY) IVPB 1000 mg/200 mL        1,000 mg 200 mL/hr over 60 Minutes Intravenous On call 03/06/20 1647 03/09/20 1542   03/06/20 1200  vancomycin (VANCOCIN) IVPB 750 mg/150 ml premix  Status:  Discontinued        750 mg 150 mL/hr over 60 Minutes Intravenous  Once 03/05/20 1426 03/05/20 1447   03/05/20 1800  vancomycin (VANCOCIN) IVPB 750 mg/150 ml premix  Status:  Discontinued        750 mg 150 mL/hr over 60 Minutes Intravenous  Once 03/05/20 0831 03/05/20 0834   03/05/20 0930  vancomycin (VANCOREADY) IVPB 1500  mg/300 mL  Status:  Discontinued        1,500 mg 150 mL/hr over 120 Minutes Intravenous  Once 03/05/20 0831 03/05/20 1447   03/05/20 0930  ceFEPIme (MAXIPIME) 1 g in sodium chloride 0.9 % 100 mL IVPB  Status:  Discontinued        1 g 200 mL/hr over 30 Minutes Intravenous Every 24 hours 03/05/20 0831 03/05/20 1447   02/29/20 1000  cefTRIAXone (ROCEPHIN) 2 g in sodium chloride 0.9 % 100 mL IVPB  Status:  Discontinued        2 g 200 mL/hr over 30 Minutes Intravenous Every 24 hours 02/29/20 0910 03/05/20 0756       Subjective: Patient seen and examined at bedside.  She denies worsening shortness of breath, chest pain, nausea or vomiting.  Objective: Vitals:   03/24/20 1043 03/24/20 1044 03/24/20 1352 03/24/20 2132  BP: 123/73 119/72 126/72 132/77  Pulse:   83 91  Resp: (!) 24 (!) 29 16 16   Temp:  98.6 F (37 C)  100.2 F (37.9 C)  TempSrc:  Oral  Oral  SpO2: 99%  98% 100%  Weight: 61.2 kg     Height:        Intake/Output Summary (Last 24 hours) at 03/25/2020 0904 Last data filed at 03/24/2020 1100 Gross per 24 hour  Intake 240 ml  Output 2850 ml  Net -2610 ml   Filed Weights   03/19/20 1124 03/24/20 0630 03/24/20 1043  Weight: 63.7 kg 64.2 kg 61.2 kg  Examination:  General exam: Appears calm and comfortable.  Currently on room air. Respiratory system: Bilateral decreased breath sounds at bases with some scattered crackles Cardiovascular system: S1 & S2 heard, Rate controlled Gastrointestinal system: Abdomen is nondistended, soft and nontender. Normal bowel sounds heard. Extremities: No cyanosis, clubbing; trace lower extremity edema Central nervous system: Alert and oriented. No focal neurological deficits. Moving extremities Skin: No rashes, lesions or ulcers Psychiatry: Judgement and insight appear normal. Mood & affect appropriate.     Data Reviewed: I have personally reviewed following labs and imaging studies  CBC: Recent Labs  Lab 03/21/20 0138  03/21/20 1800 03/23/20 0219 03/24/20 0621 03/25/20 0218  WBC 8.7 9.8 9.9 9.2 8.7  NEUTROABS  --   --  6.8  --   --   HGB 7.1* 7.3* 7.0* 6.9* 9.4*  HCT 21.0* 23.1* 22.4* 21.1* 28.7*  MCV 89.4 91.7 92.6 93.0 91.4  PLT 267 268 225 250 073   Basic Metabolic Panel: Recent Labs  Lab 03/19/20 0348 03/21/20 1800 03/23/20 0219 03/24/20 0621 03/25/20 0218  NA 134* 132* 135 134* 133*  K 3.7 4.4 3.7 3.8 3.8  CL 99 98 99 99 97*  CO2 24 26 27 26 27   GLUCOSE 93 139* 90 90 91  BUN 38* 42* 23 32* 17  CREATININE 7.92* 8.15* 5.92* 8.38* 5.38*  CALCIUM 7.9* 7.9* 8.3* 8.0* 8.1*  MG 1.9  --   --   --  1.8  PHOS  --  3.6  --  3.7  --    GFR: Estimated Creatinine Clearance: 8.9 mL/min (A) (by C-G formula based on SCr of 5.38 mg/dL (H)). Liver Function Tests: Recent Labs  Lab 03/21/20 1800 03/24/20 0621  ALBUMIN 1.8* 1.9*   No results for input(s): LIPASE, AMYLASE in the last 168 hours. No results for input(s): AMMONIA in the last 168 hours. Coagulation Profile: No results for input(s): INR, PROTIME in the last 168 hours. Cardiac Enzymes: No results for input(s): CKTOTAL, CKMB, CKMBINDEX, TROPONINI in the last 168 hours. BNP (last 3 results) No results for input(s): PROBNP in the last 8760 hours. HbA1C: No results for input(s): HGBA1C in the last 72 hours. CBG: Recent Labs  Lab 03/23/20 2100 03/24/20 1119 03/24/20 1648 03/24/20 2130 03/25/20 0718  GLUCAP 105* 78 89 102* 83   Lipid Profile: No results for input(s): CHOL, HDL, LDLCALC, TRIG, CHOLHDL, LDLDIRECT in the last 72 hours. Thyroid Function Tests: No results for input(s): TSH, T4TOTAL, FREET4, T3FREE, THYROIDAB in the last 72 hours. Anemia Panel: No results for input(s): VITAMINB12, FOLATE, FERRITIN, TIBC, IRON, RETICCTPCT in the last 72 hours. Sepsis Labs: No results for input(s): PROCALCITON, LATICACIDVEN in the last 168 hours.  No results found for this or any previous visit (from the past 240 hour(s)).        Radiology Studies: No results found.      Scheduled Meds: . chlorhexidine gluconate (MEDLINE KIT)  15 mL Mouth Rinse BID  . Chlorhexidine Gluconate Cloth  6 each Topical Q0600  . Chlorhexidine Gluconate Cloth  6 each Topical Q0600  . [START ON 03/31/2020] darbepoetin (ARANESP) injection - DIALYSIS  100 mcg Intravenous Q Tue-HD  . dextromethorphan  30 mg Oral BID  . docusate sodium  100 mg Oral BID  . feeding supplement  237 mL Oral TID BM  . levETIRAcetam  250 mg Oral Q T,Th,Sa-HD  . levETIRAcetam  500 mg Oral Daily  . mouth rinse  15 mL Mouth Rinse BID  . metoprolol  tartrate  75 mg Oral BID  . multivitamin  1 tablet Oral QHS  . NIFEdipine  90 mg Oral Daily  . predniSONE  30 mg Oral Q breakfast   Followed by  . [START ON 03/27/2020] predniSONE  20 mg Oral Q breakfast   Followed by  . [START ON 04/01/2020] predniSONE  10 mg Oral Q breakfast   Continuous Infusions: . sodium chloride 10 mL/hr at 03/14/20 1441          Thierry Dobosz, MD [facility] 03/25/2020, 9:04 AM

## 2020-03-25 NOTE — Progress Notes (Signed)
Occupational Therapy Treatment Patient Details Name: Barbara Crawford MRN: [id number] DOB: 12-06-1955 Today's Date: 03/25/2020    History of present illness 65 y.o. female with medical history significant of HTN. Pt had COVID in Dec, 1 week prior to presenting to ED 02/25/20 she developed swelling in BLE. Developed pain in B lower back; CT abd/pelvis - no obstruction or acute abnormality. HGB 6.3, +acute renal failure; 2/23 seizure and Code Blue with CPR x 4 minutes and intubated 2/23-2/25; CRRT; renal biopsy 2/28 with perinephric hematoma; pneumonia;  IJ catheter placed and HD initiated. Plans to have dialysis access placed 3/11   OT comments  Continued education regarding energy conservation and pt performing light grooming when standing at sink. Pt realizes that with this new diagnosis, pt has to slow down and listen to her body. Pt excited that she went up/down 10 steps today. Pt continues to progress and was not too fatigued to perform ADL and mobility in room after PT session to simulate lethargy on dialysis day when d.c home. Pt would benefit from continued OT. OT following acutely.   Follow Up Recommendations  Home health OT (Supervision for OOB ADL  and mobility)    Equipment Recommendations  3 in 1 bedside commode    Recommendations for Other Services      Precautions / Restrictions Precautions Precautions: Fall Restrictions Weight Bearing Restrictions: No       Mobility Bed Mobility               General bed mobility comments: in recliner pre and post session    Transfers Overall transfer level: Needs assistance Equipment used: None Transfers: Sit to/from Stand Sit to Stand: Supervision         General transfer comment: supervisionA for safety    Balance Overall balance assessment: Modified Independent Sitting-balance support: Feet supported Sitting balance-Leahy Scale: Good     Standing balance support: No upper extremity supported;During  functional activity Standing balance-Leahy Scale: Good Standing balance comment: performing tasks at sink without support                           ADL either performed or assessed with clinical judgement   ADL Overall ADL's : Needs assistance/impaired Eating/Feeding: Modified independent   Grooming: Set up;Supervision/safety;Standing   Upper Body Bathing: Supervision/ safety;Set up;Standing               Toilet Transfer: Supervision/safety;RW           Functional mobility during ADLs: Supervision/safety;Rolling walker General ADL Comments: Continued education regarding energy conservation and pt performing light grooming when standing at sink. Pt realizes that with this new diagnosis, pt has to slow down and listen to her body. Pt excited that she went up/down 10 steps today.     Vision       Perception     Praxis      Cognition Arousal/Alertness: Awake/alert Behavior During Therapy: WFL for tasks assessed/performed Overall Cognitive Status: Within Functional Limits for tasks assessed Area of Impairment: Problem solving                 Orientation Level: Time           Problem Solving: Slow processing General Comments: slow processing        Exercises     Shoulder Instructions       General Comments Saw pt after PT session and pt was not fatigued at this time  to perform ADL task and ambulate in room. Pt exhibitng energy conservatiom strategies in room.    Pertinent Vitals/ Pain       Pain Assessment: No/denies pain  Home Living                                          Prior Functioning/Environment              Frequency  Min 2X/week        Progress Toward Goals  OT Goals(current goals can now be found in the care plan section)  Progress towards OT goals: Progressing toward goals  Acute Rehab OT Goals Patient Stated Goal: To return home. OT Goal Formulation: With patient Time For Goal  Achievement: 04/03/20 Potential to Achieve Goals: Good ADL Goals Pt Will Perform Grooming: standing;with supervision Pt Will Perform Lower Body Dressing: with supervision;sit to/from stand Pt Will Transfer to Toilet: ambulating;with supervision Pt Will Perform Toileting - Clothing Manipulation and hygiene: with supervision;sit to/from stand Pt Will Perform Tub/Shower Transfer: with supervision;3 in 1 Pt/caregiver will Perform Home Exercise Program: Both right and left upper extremity;Increased strength;With written HEP provided  Plan Discharge plan remains appropriate;Frequency remains appropriate    Co-evaluation                 AM-PAC OT "6 Clicks" Daily Activity     Outcome Measure   Help from another person eating meals?: None Help from another person taking care of personal grooming?: A Little Help from another person toileting, which includes using toliet, bedpan, or urinal?: A Little Help from another person bathing (including washing, rinsing, drying)?: A Little Help from another person to put on and taking off regular upper body clothing?: A Little Help from another person to put on and taking off regular lower body clothing?: A Little 6 Click Score: 19    End of Session Equipment Utilized During Treatment: Rolling walker  OT Visit Diagnosis: Unsteadiness on feet (R26.81);Muscle weakness (generalized) (M62.81)   Activity Tolerance Patient tolerated treatment well   Patient Left in chair;with call bell/phone within reach;with family/visitor present   Nurse Communication Mobility status        Time: [id number] OT Time Calculation (min): 19 min  Charges: OT General Charges $OT Visit: 1 Visit OT Treatments $Self Care/Home Management : 8-22 mins  Jefferey Pica, OTR/L Acute Rehabilitation Services Pager: [phone] Office: [phone]    Weslee Prestage C 03/25/2020, 2:15 PM

## 2020-03-25 NOTE — Progress Notes (Signed)
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  Continue dialysis on TTS schedule with process underway for outpatient dialysis unit placement (this has been difficult due to the unique insurance coverage that the patient has).   -She had creation of a left brachiocephalic fistula by Dr. Carlis Abbott on 3/11 and is currently getting hemodialysis via a right IJ tunneled hemodialysis catheter. -tolerating HD today, next treatment thursday  Suspected healthcare associated pneumonia: On intravenous cefepime and vancomycin and remains intermittently febrile.  She has fever of unknown origin that has been worked up with imaging/cultures.  Was started her on prednisone for suspected autoimmune mechanism associated fevers, per primary.  Anemia: Likely from underlying chronic disease and surgical losses.  Ferritin level very high. Continue Aranesp and transfuse as needed. Will increase dose to 113mg qtuesdays  Hypertension: Blood pressure acceptable. LE  edema manage with dialysis/Ultrafiltration.  Secondary hyperparathyroidism: Calcium and phosphorus under acceptable control, not on binders at this time.  PTH level was 193 on 2/23-at goal for ESRD.  Hyponatremia: Improved. Managed with dialysis, continue fluid restriction.  Subjective: Seen and examined at bedside, no acute events. Tolerated hd yesterday, net uf 2850cc. No complaints at this time.  Objective Vital signs in last 24 hours: Vitals:   03/24/20 1043 03/24/20 1044 03/24/20 1352 03/24/20 2132  BP: 123/73 119/72 126/72 132/77  Pulse:   83 91  Resp: (!) 24 (!) _0 Temp:  98.6 F (37 C)  100.2 F (37.9 C)  TempSrc:  Oral  Oral  SpO2: 99%  98% 100%  Weight: 61.2 kg     Height:       Weight change: -3 kg No  intake or output data in the 24 hours ending 03/25/20 1226     Labs: Basic Metabolic Panel: Recent Labs  Lab 03/21/20 1800 03/23/20 0219 03/24/20 0621 03/25/20 0218  NA 132* 135 134* 133*  K 4.4 3.7 3.8 3.8  CL 98 99 99 97*  CO2 _1 GLUCOSE 139* 90 90 91  BUN 42* 23 32* 17  CREATININE 8.15* 5.92* 8.38* 5.38*  CALCIUM 7.9* 8.3* 8.0* 8.1*  PHOS 3.6  --  3.7  --    Liver Function Tests: Recent Labs  Lab 03/21/20 1800 03/24/20 0621  ALBUMIN 1.8* 1.9*   No results for input(s): LIPASE, AMYLASE in the last 168 hours. No results for input(s): AMMONIA in the last 168 hours. CBC: Recent Labs  Lab 03/21/20 0138 03/21/20 1800 03/23/20 0219 03/24/20 0621 03/25/20 0218  WBC 8.7 9.8 9.9 9.2 8.7  NEUTROABS  --   --  6.8  --   --   HGB 7.1* 7.3* 7.0* 6.9* 9.4*  HCT 21.0* 23.1* 22.4* 21.1* 28.7*  MCV 89.4 91.7 92.6 93.0 91.4  PLT 267 268 225 250 188   Cardiac Enzymes: No results for input(s): CKTOTAL, CKMB, CKMBINDEX, TROPONINI in the last 168 hours. CBG: Recent Labs  Lab 03/24/20 1119 03/24/20 1648 03/24/20 2130 03/25/20 0718 03/25/20 1145  GLUCAP 78 89 102* 83 101*    Iron Studies: No results for input(s): IRON, TIBC, TRANSFERRIN, FERRITIN in the last 72 hours. Studies/Results: No results found.  Medications: Infusions: . sodium chloride 10 mL/hr at 03/14/20 1441    Scheduled Medications: . chlorhexidine gluconate (MEDLINE KIT)  15 mL Mouth Rinse BID  .  Chlorhexidine Gluconate Cloth  6 each Topical Q0600  . Chlorhexidine Gluconate Cloth  6 each Topical Q0600  . [START ON 03/31/2020] darbepoetin (ARANESP) injection - DIALYSIS  100 mcg Intravenous Q Tue-HD  . dextromethorphan  30 mg Oral BID  . docusate sodium  100 mg Oral BID  . feeding supplement  237 mL Oral TID BM  . levETIRAcetam  250 mg Oral Q T,Th,Sa-HD  . levETIRAcetam  500 mg Oral Daily  . mouth rinse  15 mL Mouth Rinse BID  . metoprolol tartrate  75 mg Oral BID  . multivitamin  1  tablet Oral QHS  . NIFEdipine  90 mg Oral Daily  . predniSONE  30 mg Oral Q breakfast   Followed by  . [START ON 03/27/2020] predniSONE  20 mg Oral Q breakfast   Followed by  . [START ON 04/01/2020] predniSONE  10 mg Oral Q breakfast    have reviewed scheduled and prn medications.  Physical Exam: General:NAD, laying flat in bed Heart:RRR, s1s2 nl Lungs:clear b/l, no w/r/r/c, unlabored, bl chest expansion Abdomen:soft, Non-tender, non-distended Extremities: trace ankle edema bilaterally Dialysis Access: Right IJ TDC, site clean.  Vikas Singh 03/25/2020,12:26 PM  LOS: 29 days

## 2020-03-25 NOTE — Discharge Instructions (Signed)
[facility]  Discharge Instructions  AV Fistula or Graft Surgery for Dialysis Access  Please refer to the following instructions for your post-procedure care. Your surgeon or physician assistant will discuss any changes with you.  Activity  You may drive the day following your surgery, if you are comfortable and no longer taking prescription pain medication. Resume full activity as the soreness in your incision resolves.  Bathing/Showering  You may shower after you go home. Keep your incision dry for 48 hours. Do not soak in a bathtub, hot tub, or swim until the incision heals completely. You may not shower if you have a hemodialysis catheter.  Incision Care  Clean your incision with mild soap and water after 48 hours. Pat the area dry with a clean towel. You do not need a bandage unless otherwise instructed. Do not apply any ointments or creams to your incision. You may have skin glue on your incision. Do not peel it off. It will come off on its own in about one week. Your arm may swell a bit after surgery. To reduce swelling use pillows to elevate your arm so it is above your heart. Your doctor will tell you if you need to lightly wrap your arm with an ACE bandage.  Diet  Resume your normal diet. There are not special food restrictions following this procedure. In order to heal from your surgery, it is CRITICAL to get adequate nutrition. Your body requires vitamins, minerals, and protein. Vegetables are the best source of vitamins and minerals. Vegetables also provide the perfect balance of protein. Processed food has little nutritional value, so try to avoid this.  Medications  Resume taking all of your medications. If your incision is causing pain, you may take over-the counter pain relievers such as acetaminophen (Tylenol). If you were prescribed a stronger pain medication, please be aware these medications can cause nausea and constipation. Prevent  nausea by taking the medication with a snack or meal. Avoid constipation by drinking plenty of fluids and eating foods with high amount of fiber, such as fruits, vegetables, and grains.  Do not take Tylenol if you are taking prescription pain medications.  Follow up Your surgeon may want to see you in the office following your access surgery. If so, this will be arranged at the time of your surgery.  Please call us immediately for any of the following conditions:  . Increased pain, redness, drainage (pus) from your incision site . Fever of 101 degrees or higher . Severe or worsening pain at your incision site . Hand pain or numbness. .  Reduce your risk of vascular disease:  . Stop smoking. If you would like help, call [facility] at 1-800-QUIT-NOW (309)313-5272) or [place] at [phone]  . Manage your cholesterol . Maintain a desired weight . Control your diabetes . Keep your blood pressure down  Dialysis  It will take several weeks to several months for your new dialysis access to be ready for use. Your surgeon will determine when it is okay to use it. Your nephrologist will continue to direct your dialysis. You can continue to use your Permcath until your new access is ready for use.   03/13/2020 Barbara Crawford [id number] Jun 02, 1955  Surgeon(s): Marty Heck, MD  Procedure(s): Creation of Left Brachiocephalic fistula   May stick graft immediately   May stick graft on designated area only:   X Do not stick left AV fistula for 12 weeks  If you have any questions, please call the office at [phone].

## 2020-03-25 NOTE — Progress Notes (Signed)
Nutrition Follow-up  DOCUMENTATION CODES:   Not applicable  INTERVENTION:   Consider liberalizing diet to regular to encourage PO intake  D/c Ensure, pt refusing  Magic cup TID with meals, each supplement provides 290 kcal and 9 grams of protein  Boost Breeze po TID, each supplement provides 250 kcal and 9 grams of protein  Continue Renal MVI daily  RD plans to follow-up with pt as able/as appropriate regarding renal diet to address additional questions/concerns.   NUTRITION DIAGNOSIS:   Increased nutrient needs related to acute illness (renal failure requiring CRRT) as evidenced by estimated needs.  ongoing  GOAL:   Patient will meet greater than or equal to 90% of their needs  Progressing  MONITOR:   PO intake,Supplement acceptance,Labs,Weight trends,I & O's  REASON FOR ASSESSMENT:   Consult Diet education  ASSESSMENT:   65 yo female admitted with acute renal failure complicated by seizure and cardiac arrest requiring intubation on 2/23. PMH includes HTN, recent COVID infection Dec 2021.  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  3/07 IJ HD cath [id number] L brachiocephalic fistula placed [id number] CT abd/pelvis showed evolving left perinephric hematoma without acute hemorrhagic component. 3/15 CT showed decreasing bilateral effusion   Pt is pending discharge to home with home health as soon as outpatient HD unit is arranged.   Per MD, pt with normocytic anemia (slowly downtrending) likely related to anemia of CKD.   Only 2 meals documented since last RD assessment, each with 50% meal completion. Pt with orders for Ensure TID but pt has been primarily refusing these per RN. Will trial other supplements in hopes of increasing pt's intake.   Admission weight: 70.4 kg Current weight: 61.2 kg  Pt remains edematous. Per RN assessment, pt with non-pitting edema to BUE and moderate pitting  edema to BLE.   Last HD 3/22, 2857m net UF I/O: -12,7717msince admit  Medications: aranesp, colace, rena-vit, deltasone Labs: Na 133 (L), Cr 5.38 (H, down from yesterday) CBGs 83-101-112  Diet Order:   Diet Order            Diet Heart Room service appropriate? Yes; Fluid consistency: Thin; Fluid restriction: 1200 mL Fluid  Diet effective now                 EDUCATION NEEDS:   Education needs have been addressed  Skin:  Skin Assessment: Skin Integrity Issues: Skin Integrity Issues:: Incisions Incisions: L arm  Last BM:  3/22  Height:   Ht Readings from Last 1 Encounters:  02/26/20 5' 2.99" (1.6 m)    Weight:   Wt Readings from Last 1 Encounters:  03/24/20 61.2 kg    Ideal Body Weight:  52.3 kg  BMI:  Body mass index is 23.91 kg/m.  Estimated Nutritional Needs:   Kcal:  2100-2300  Protein:  135-155 grams  Fluid:  1000 ml + UOP    AmLarkin InaMS, RD, LDN RD pager number and weekend/on-call pager number located in [facility]

## 2020-03-26 DIAGNOSIS — R21 Rash and other nonspecific skin eruption: Secondary | ICD-10-CM

## 2020-03-26 DIAGNOSIS — R8271 Bacteriuria: Secondary | ICD-10-CM | POA: Diagnosis not present

## 2020-03-26 DIAGNOSIS — I469 Cardiac arrest, cause unspecified: Secondary | ICD-10-CM | POA: Diagnosis not present

## 2020-03-26 DIAGNOSIS — J9621 Acute and chronic respiratory failure with hypoxia: Secondary | ICD-10-CM | POA: Diagnosis not present

## 2020-03-26 DIAGNOSIS — N179 Acute kidney failure, unspecified: Secondary | ICD-10-CM | POA: Diagnosis not present

## 2020-03-26 LAB — BASIC METABOLIC PANEL
Anion gap: 10 (ref 5–15)
BUN: 35 mg/dL — ABNORMAL HIGH (ref 8–23)
CO2: 25 mmol/L (ref 22–32)
Calcium: 8.4 mg/dL — ABNORMAL LOW (ref 8.9–10.3)
Chloride: 97 mmol/L — ABNORMAL LOW (ref 98–111)
Creatinine, Ser: 7.62 mg/dL — ABNORMAL HIGH (ref 0.44–1.00)
GFR, Estimated: 6 mL/min — ABNORMAL LOW (ref >60)
Glucose, Bld: 95 mg/dL (ref 70–99)
Potassium: 4 mmol/L (ref 3.5–5.1)
Sodium: 132 mmol/L — ABNORMAL LOW (ref 135–145)

## 2020-03-26 LAB — CBC WITH DIFFERENTIAL/PLATELET
Abs Immature Granulocytes: 0.21 K/uL — ABNORMAL HIGH (ref 0.00–0.07)
Basophils Absolute: 0.1 K/uL (ref 0.0–0.1)
Basophils Relative: 1 %
Eosinophils Absolute: 0.1 K/uL (ref 0.0–0.5)
Eosinophils Relative: 1 %
HCT: 28.3 % — ABNORMAL LOW (ref 36.0–46.0)
Hemoglobin: 9.3 g/dL — ABNORMAL LOW (ref 12.0–15.0)
Immature Granulocytes: 3 %
Lymphocytes Relative: 8 %
Lymphs Abs: 0.6 K/uL — ABNORMAL LOW (ref 0.7–4.0)
MCH: 29.8 pg (ref 26.0–34.0)
MCHC: 32.9 g/dL (ref 30.0–36.0)
MCV: 90.7 fL (ref 80.0–100.0)
Monocytes Absolute: 1.1 K/uL — ABNORMAL HIGH (ref 0.1–1.0)
Monocytes Relative: 13 %
Neutro Abs: 6.2 K/uL (ref 1.7–7.7)
Neutrophils Relative %: 74 %
Platelets: 189 K/uL (ref 150–400)
RBC: 3.12 MIL/uL — ABNORMAL LOW (ref 3.87–5.11)
RDW: 16.2 % — ABNORMAL HIGH (ref 11.5–15.5)
WBC: 8.3 K/uL (ref 4.0–10.5)
nRBC: 0.2 % (ref 0.0–0.2)

## 2020-03-26 LAB — HEPATITIS B SURFACE ANTIGEN: Hepatitis B Surface Ag: NONREACTIVE

## 2020-03-26 LAB — GLUCOSE, CAPILLARY
Glucose-Capillary: 73 mg/dL (ref 70–99)
Glucose-Capillary: 113 mg/dL — ABNORMAL HIGH (ref 70–99)
Glucose-Capillary: 133 mg/dL — ABNORMAL HIGH (ref 70–99)

## 2020-03-26 MED ORDER — HEPARIN SODIUM (PORCINE) 1000 UNIT/ML IJ SOLN
INTRAMUSCULAR | Status: AC
  Administered 2020-03-26: 1000 [IU]
  Filled 2020-03-26: qty 4

## 2020-03-26 MED ORDER — DIPHENHYDRAMINE-ZINC ACETATE 2-0.1 % EX CREA
TOPICAL_CREAM | Freq: Four times a day (QID) | CUTANEOUS | Status: DC | PRN
  Filled 2020-03-26 (×2): qty 28

## 2020-03-26 MED ORDER — LORATADINE 10 MG PO TABS
10.0000 mg | ORAL_TABLET | Freq: Every day | ORAL | Status: DC | PRN
  Administered 2020-03-27: 10 mg via ORAL
  Filled 2020-03-26: qty 1

## 2020-03-26 NOTE — Progress Notes (Signed)
I was present at this dialysis session. I have reviewed the session itself and made appropriate changes.   Filed Weights   03/24/20 0630 03/24/20 1043 03/26/20 0735  Weight: 64.2 kg 61.2 kg 60.8 kg    Recent Labs  Lab 03/24/20 0621 03/25/20 0218 03/26/20 0115  NA 134*   < > 132*  K 3.8   < > 4.0  CL 99   < > 97*  CO2 26   < > 25  GLUCOSE 90   < > 95  BUN 32*   < > 35*  CREATININE 8.38*   < > 7.62*  CALCIUM 8.0*   < > 8.4*  PHOS 3.7  --   --    < > = values in this interval not displayed.    Recent Labs  Lab 03/23/20 0219 03/24/20 0621 03/25/20 0218 03/26/20 0115  WBC 9.9 9.2 8.7 8.3  NEUTROABS 6.8  --   --  6.2  HGB 7.0* 6.9* 9.4* 9.3*  HCT 22.4* 21.1* 28.7* 28.3*  MCV 92.6 93.0 91.4 90.7  PLT 225 250 188 189    Scheduled Meds: . chlorhexidine gluconate (MEDLINE KIT)  15 mL Mouth Rinse BID  . [START ON 03/31/2020] darbepoetin (ARANESP) injection - DIALYSIS  100 mcg Intravenous Q Tue-HD  . dextromethorphan  30 mg Oral BID  . docusate sodium  100 mg Oral BID  . feeding supplement  1 Container Oral TID BM  . heparin sodium (porcine)      . levETIRAcetam  250 mg Oral Q T,Th,Sa-HD  . levETIRAcetam  500 mg Oral Daily  . mouth rinse  15 mL Mouth Rinse BID  . metoprolol tartrate  75 mg Oral BID  . multivitamin  1 tablet Oral QHS  . NIFEdipine  90 mg Oral Daily  . predniSONE  30 mg Oral Q breakfast   Followed by  . [START ON 03/27/2020] predniSONE  20 mg Oral Q breakfast   Followed by  . [START ON 04/01/2020] predniSONE  10 mg Oral Q breakfast   Continuous Infusions: . sodium chloride 10 mL/hr at 03/14/20 1441   PRN Meds:.acetaminophen, diphenhydrAMINE-zinc acetate, guaiFENesin, labetalol, loratadine, LORazepam, melatonin, menthol-cetylpyridinium, ondansetron **OR** ondansetron (ZOFRAN) IV, oxyCODONE   Gean Quint, MD [facility] 03/26/2020, 11:11 AM

## 2020-03-26 NOTE — Progress Notes (Signed)
Physical Therapy Treatment Patient Details Name: Barbara Crawford MRN: [id number] DOB: [birth year] Today's Date: 03/26/2020    History of Present Illness 65 y.o. female with medical history significant of HTN. Pt had COVID in Dec, 1 week prior to presenting to ED 02/25/20 she developed swelling in BLE. Developed pain in B lower back; CT abd/pelvis - no obstruction or acute abnormality. HGB 6.3, +acute renal failure; 2/23 seizure and Code Blue with CPR x 4 minutes and intubated 2/23-2/25; CRRT; renal biopsy 2/28 with perinephric hematoma; pneumonia;  IJ catheter placed and HD initiated. Plans to have dialysis access placed 3/11    PT Comments    Pt able to ascend 22 stairs with seated rest on the landing half way up. Suggested to pt that her family can place small chair on landing at her apt to allow her to rest if needed. Continue to recommend home with [facility]   Follow Up Recommendations  Home health PT;Supervision - Intermittent     Equipment Recommendations  Rolling walker with 5" wheels    Recommendations for Other Services       Precautions / Restrictions Precautions Precautions: Fall Restrictions Weight Bearing Restrictions: No    Mobility  Bed Mobility               General bed mobility comments: Pt sitting EOB    Transfers Overall transfer level: Needs assistance Equipment used: None Transfers: Sit to/from Stand Sit to Stand: Supervision         General transfer comment: supervision for safety  Ambulation/Gait Ambulation/Gait assistance: Min guard Gait Distance (Feet): 5 Feet Assistive device: None Gait Pattern/deviations: Step-through pattern;Decreased stride length;Wide base of support Gait velocity: slow Gait velocity interpretation: <1.31 ft/sec, indicative of household ambulator General Gait Details: to/from rollator seat to transport to/from stairwell   Stairs Stairs: Yes Stairs assistance: Min assist Stair Management: One rail Right;Step  to pattern;Forwards Number of Stairs: 22 General stair comments: Seated rest on landing prior to ascending remaining half of the stairs   Wheelchair Mobility    Modified Rankin (Stroke Patients Only)       Balance Overall balance assessment: Modified Independent Sitting-balance support: Feet supported Sitting balance-Leahy Scale: Good     Standing balance support: No upper extremity supported;During functional activity Standing balance-Leahy Scale: Good                              Cognition Arousal/Alertness: Awake/alert Behavior During Therapy: WFL for tasks assessed/performed Overall Cognitive Status: Within Functional Limits for tasks assessed                                 General Comments: Pt reports it has been an emotional day      Exercises      General Comments        Pertinent Vitals/Pain Pain Assessment: No/denies pain    Home Living                      Prior Function            PT Goals (current goals can now be found in the care plan section) Acute Rehab PT Goals Patient Stated Goal: To return home. Progress towards PT goals: Progressing toward goals    Frequency    Min 3X/week      PT Plan Current plan remains appropriate  Co-evaluation              AM-PAC PT "6 Clicks" Mobility   Outcome Measure  Help needed turning from your back to your side while in a flat bed without using bedrails?: None Help needed moving from lying on your back to sitting on the side of a flat bed without using bedrails?: A Little Help needed moving to and from a bed to a chair (including a wheelchair)?: A Little Help needed standing up from a chair using your arms (e.g., wheelchair or bedside chair)?: A Little Help needed to walk in hospital room?: A Little Help needed climbing 3-5 steps with a railing? : A Little 6 Click Score: 19    End of Session Equipment Utilized During Treatment: Gait belt Activity  Tolerance: Patient tolerated treatment well Patient left: with call bell/phone within reach;in bed (sitting EOB)   PT Visit Diagnosis: Muscle weakness (generalized) (M62.81)     Time: [id number] PT Time Calculation (min) (ACUTE ONLY): 21 min  Charges:  $Gait Training: 8-22 mins                     [place] Pager [phone] Office [place] 03/26/2020, 3:22 PM

## 2020-03-26 NOTE — Progress Notes (Signed)
Patient ID: Barbara Crawford, female   DOB: [birth year], 65 y.o.   MRN: [id number]  PROGRESS NOTE    Barbara Crawford  AVW:[id number] DOB: 1955-06-06 DOA: 02/25/2020 PCP: Vonna Drafts, FNP   Brief Narrative:  65 yo female with past medical history of hypertension, prior Covid (Dec 2021) presented to the hospital with complaints of lower extremity swelling, back pain, generalized malaise, fatigue at home. In the ED,she was found to have a hemoglobin of 6.3, potassium 5.4, sodium 130, bicarb 9, BUN 144, and creatinine 29.98. CT of the abdomen and pelvis was negative for obstruction or acute abnormality. Temporary hemodialysis catheter was placed by IR and subsequently, the patient had a PEA arrest associated with seizure-like activity. She was intubated and subsequently extubated on 02/28/2020. 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 Renal biopsy was done 03/03/2019. ANA positive. ID followed the patient for fever. Dialysis cath was removed on 03/06/20 and subsequently underwent permacath placement.  During the hospitalization, patient continued to have fever so ID wasreconsulted.Extensive work-up was done. Recent x-ray had shown consolidation in the lungs. Patient was started on vancomycin and cefepime for possible healthcare associated pneumonia and subsequentlycompleted the course of antibiotic.The thought process was that there was possibility of autoimmune disease causing kidney disease and fever. She was treated with prednisone with improvement in fever.   Currently awaiting for outpatient hemodialysis arrangement.  She was noted to have slightly lower hemoglobin level on 03/24/20 requiring 1 unit PRBC transfusion.  No overt bleeding identified.  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 CT scan of the abdomen pelvis from 03/16/2020 showed evolving left perinephric hematoma without acute hemorrhagic component.  -Status post IR guided internal jugular tunneled hemodialysis catheter on 03/09/2020 -Nephrology is following and dialysis as per nephrology schedule.  Awaiting outpatient hemodialysis unit arrangement  Fever/possible healthcare associated pneumonia:  -Has completed course of vancomycin and cefepime.  Blood/urine cultures have been negative so far -Duplex ultrasound was negative for DVT. -Continue incentive spirometry -Possible autoimmune etiology of fever as well. CT scan of the abdomen pelvis with evolving left perinephric hematoma. CT scan of the chest was performed which showed mediastinal and axillary lymph nodes with decreasing bilateral pleural effusion. -Continue tapering doses of prednisone -Had a temperature max 100.2 over the last 24 hours.  Monitor temperature curve.  Anemia of chronic disease -Status post 3 units of packed red cells transfusion during this hospitalization; last transfusion was on 03/24/2020 for hemoglobin of 6.9.  Hemoglobin 9.3 today.  Monitor  Seizure -Patient had seizure with loss of pulse and PEA arrest status post CPR.  Patient was seen by neurology during his hospitalization. -No further seizures while inpatient.  Continue current doses of Keppra.  Outpatient follow-up with neurology  Rash -Patient developed rash yesterday after receiving CHG bath with wipes: Mostly over the thighs and arm.  Will DC these bath with wipes.  Monitor.  Use loratadine/antiallergic for itching.  Hyponatremia -Being managed by nephrology by dialysis.  Essential hypertension -Blood pressure stable.  Continue nifedipine, metoprolol  Acute on chronic respiratory failure with hypoxia -Resolved.  Extubated on 02/28/2020.  Currently on room  air  Cardiac arrest with pulseless electrical activity -Status post CPR and intubation.  Stable at this time  Debility/deconditioning -Will need home health PT  on discharge  DVT prophylaxis: SCDs Code Status: Full Family Communication: None at bedside Disposition Plan: Status is: Inpatient  Remains inpatient appropriate because:Inpatient level of care appropriate due to severity of illness   Dispo:  Patient From: Home  Planned Disposition: Home with home health as soon as outpatient hemodialysis unit is arranged  Medically stable for discharge: Yes   Consultants: PCCM/nephrology/IR/ID/vascular surgery/neurology  Procedures: Intubation/extubation Renal biopsy IR guided tunneled dialysis catheter placement  Antimicrobials:  Anti-infectives (From admission, onward)   Start     Dose/Rate Route Frequency Ordered Stop   03/18/20 0900  ceFEPIme (MAXIPIME) 2 g in sodium chloride 0.9 % 100 mL IVPB        2 g 200 mL/hr over 30 Minutes Intravenous  Once 03/18/20 0751 03/18/20 0953   03/17/20 1200  vancomycin (VANCOCIN) IVPB 750 mg/150 ml premix        750 mg 150 mL/hr over 60 Minutes Intravenous Every T-Th-Sa (Hemodialysis) 03/14/20 1027 03/19/20 1158   03/14/20 1200  vancomycin (VANCOCIN) IVPB 1000 mg/200 mL premix        1,000 mg 200 mL/hr over 60 Minutes Intravenous  Once 03/14/20 1027 03/14/20 1641   03/14/20 1200  ceFEPIme (MAXIPIME) 2 g in sodium chloride 0.9 % 100 mL IVPB        2 g 200 mL/hr over 30 Minutes Intravenous Every T-Th-Sa (Hemodialysis) 03/14/20 1027 03/21/20 1159   03/14/20 0600  vancomycin (VANCOREADY) IVPB 1000 mg/200 mL        1,000 mg 200 mL/hr over 60 Minutes Intravenous To Short Stay 03/13/20 0712 03/13/20 0954   03/09/20 0600  vancomycin (VANCOREADY) IVPB 1000 mg/200 mL        1,000 mg 200 mL/hr over 60 Minutes Intravenous On call 03/06/20 1647 03/09/20 1542   03/06/20 1200  vancomycin (VANCOCIN) IVPB 750 mg/150 ml premix  Status:  Discontinued         750 mg 150 mL/hr over 60 Minutes Intravenous  Once 03/05/20 1426 03/05/20 1447   03/05/20 1800  vancomycin (VANCOCIN) IVPB 750 mg/150 ml premix  Status:  Discontinued        750 mg 150 mL/hr over 60 Minutes Intravenous  Once 03/05/20 0831 03/05/20 0834   03/05/20 0930  vancomycin (VANCOREADY) IVPB 1500 mg/300 mL  Status:  Discontinued        1,500 mg 150 mL/hr over 120 Minutes Intravenous  Once 03/05/20 0831 03/05/20 1447   03/05/20 0930  ceFEPIme (MAXIPIME) 1 g in sodium chloride 0.9 % 100 mL IVPB  Status:  Discontinued        1 g 200 mL/hr over 30 Minutes Intravenous Every 24 hours 03/05/20 0831 03/05/20 1447   02/29/20 1000  cefTRIAXone (ROCEPHIN) 2 g in sodium chloride 0.9 % 100 mL IVPB  Status:  Discontinued        2 g 200 mL/hr over 30 Minutes Intravenous Every 24 hours 02/29/20 0910 03/05/20 0756       Subjective: Patient seen and examined at bedside undergoing hemodialysis.  Denies worsening shortness of breath, abdominal pain, vomiting.  Complains of rash that occurred yesterday after getting the CHG bath with wipes.  Complains of itching. Objective: Vitals:   03/24/20 2132 03/25/20 1358 03/25/20 2122 03/26/20 0742  BP: 132/77 114/67 124/75 123/74  Pulse: 91 84 90 82  Resp: 16 16 18    Temp: 100.2 F (37.9 C)  98.2 F (36.8 C)   TempSrc: Oral  Oral   SpO2: 100% 99% 99%   Weight:  Height:       No intake or output data in the 24 hours ending 03/26/20 0805 Filed Weights   03/19/20 1124 03/24/20 0630 03/24/20 1043  Weight: 63.7 kg 64.2 kg 61.2 kg    Examination:  General exam: No acute distress.  On room air currently. Respiratory system: Decreased breath sounds at bases bilaterally with some crackles cardiovascular system: Rate controlled, S1-S2 heard Gastrointestinal system: Abdomen is nondistended, soft and nontender.  Bowel sounds are heard  extremities: Mild lower extremity edema present; no clubbing  Central nervous system: Awake and alert.  No focal  neurological deficits.  Moves extremities  skin: No obvious ecchymosis/lesions.  Maculopapular rash over the thighs and right arm. Psychiatry: Flat affect    Data Reviewed: I have personally reviewed following labs and imaging studies  CBC: Recent Labs  Lab 03/21/20 1800 03/23/20 0219 03/24/20 0621 03/25/20 0218 03/26/20 0115  WBC 9.8 9.9 9.2 8.7 8.3  NEUTROABS  --  6.8  --   --  6.2  HGB 7.3* 7.0* 6.9* 9.4* 9.3*  HCT 23.1* 22.4* 21.1* 28.7* 28.3*  MCV 91.7 92.6 93.0 91.4 90.7  PLT 268 225 250 188 563   Basic Metabolic Panel: Recent Labs  Lab 03/21/20 1800 03/23/20 0219 03/24/20 0621 03/25/20 0218 03/26/20 0115  NA 132* 135 134* 133* 132*  K 4.4 3.7 3.8 3.8 4.0  CL 98 99 99 97* 97*  CO2 26 27 26 27 25   GLUCOSE 139* 90 90 91 95  BUN 42* 23 32* 17 35*  CREATININE 8.15* 5.92* 8.38* 5.38* 7.62*  CALCIUM 7.9* 8.3* 8.0* 8.1* 8.4*  MG  --   --   --  1.8  --   PHOS 3.6  --  3.7  --   --    GFR: Estimated Creatinine Clearance: 6.3 mL/min (A) (by C-G formula based on SCr of 7.62 mg/dL (H)). Liver Function Tests: Recent Labs  Lab 03/21/20 1800 03/24/20 0621  ALBUMIN 1.8* 1.9*   No results for input(s): LIPASE, AMYLASE in the last 168 hours. No results for input(s): AMMONIA in the last 168 hours. Coagulation Profile: No results for input(s): INR, PROTIME in the last 168 hours. Cardiac Enzymes: No results for input(s): CKTOTAL, CKMB, CKMBINDEX, TROPONINI in the last 168 hours. BNP (last 3 results) No results for input(s): PROBNP in the last 8760 hours. HbA1C: No results for input(s): HGBA1C in the last 72 hours. CBG: Recent Labs  Lab 03/24/20 2130 03/25/20 0718 03/25/20 1145 03/25/20 1623 03/25/20 2109  GLUCAP 102* 83 101* 112* 113*   Lipid Profile: No results for input(s): CHOL, HDL, LDLCALC, TRIG, CHOLHDL, LDLDIRECT in the last 72 hours. Thyroid Function Tests: No results for input(s): TSH, T4TOTAL, FREET4, T3FREE, THYROIDAB in the last 72 hours. Anemia  Panel: No results for input(s): VITAMINB12, FOLATE, FERRITIN, TIBC, IRON, RETICCTPCT in the last 72 hours. Sepsis Labs: No results for input(s): PROCALCITON, LATICACIDVEN in the last 168 hours.  No results found for this or any previous visit (from the past 240 hour(s)).       Radiology Studies: No results found.      Scheduled Meds: . chlorhexidine gluconate (MEDLINE KIT)  15 mL Mouth Rinse BID  . Chlorhexidine Gluconate Cloth  6 each Topical Q0600  . Chlorhexidine Gluconate Cloth  6 each Topical Q0600  . [START ON 03/31/2020] darbepoetin (ARANESP) injection - DIALYSIS  100 mcg Intravenous Q Tue-HD  . dextromethorphan  30 mg Oral BID  . docusate sodium  100 mg Oral  BID  . feeding supplement  1 Container Oral TID BM  . levETIRAcetam  250 mg Oral Q T,Th,Sa-HD  . levETIRAcetam  500 mg Oral Daily  . mouth rinse  15 mL Mouth Rinse BID  . metoprolol tartrate  75 mg Oral BID  . multivitamin  1 tablet Oral QHS  . NIFEdipine  90 mg Oral Daily  . predniSONE  30 mg Oral Q breakfast   Followed by  . [START ON 03/27/2020] predniSONE  20 mg Oral Q breakfast   Followed by  . [START ON 04/01/2020] predniSONE  10 mg Oral Q breakfast   Continuous Infusions: . sodium chloride 10 mL/hr at 03/14/20 1441          Brizeida Mcmurry, MD [facility] 03/26/2020, 8:05 AM

## 2020-03-26 NOTE — Progress Notes (Signed)
Renal Navigator met with patient at HD bedside to check in and offer support. Patient is well known to Navigator at this point. She states she is doing well and that "the premium is paid" in regards to her [employer]. Navigator asks that she provide Navigator with her [place] number when she has it so that the referral to [place] can be re-instated.   Alphonzo Cruise, [place] Renal Navigator [phone]

## 2020-03-26 NOTE — Progress Notes (Signed)
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  Continue dialysis on TTS schedule with process underway for outpatient dialysis unit placement (this has been difficult due to the unique insurance coverage that the patient has).   -She had creation of a left brachiocephalic fistula by Dr. Carlis Abbott on 3/11 and is currently getting hemodialysis via a right IJ tunneled hemodialysis catheter. -tolerating HD today, next treatment sat  Suspected healthcare associated pneumonia: On intravenous cefepime and vancomycin and remains intermittently febrile.  She has fever of unknown origin that has been worked up with imaging/cultures.  Was started her on prednisone for suspected autoimmune mechanism associated fevers, per primary.  Anemia: Likely from underlying chronic disease and surgical losses.  Ferritin level very high. Continue Aranesp and transfuse as needed. Will increase dose to 130mg qtuesdays  Hypertension: Blood pressure acceptable. LE  edema manage with dialysis/Ultrafiltration.  Secondary hyperparathyroidism: Calcium and phosphorus under acceptable control, not on binders at this time.  PTH level was 193 on 2/23-at goal for ESRD.  Hyponatremia: Improved/stable. Managed with dialysis, continue fluid restriction.  Subjective: Seen and examined at bedside in dialysis, had a reaction to some antibacterial wipes, now has a rash for which the benadryl cream is helping. No other complaints at the moment, tolerating HD. Had a few episodes of hypotension which improved after turning off UF.  Objective Vital signs in last 24 hours: Vitals:   03/26/20 0945 03/26/20 1000 03/26/20 1030 03/26/20 1100  BP: (!) 100/59 100/67 103/66 (!) 99/57  Pulse: 88 91 91 94  Resp: (!) 24 (!) 24  (!) 22 (!) 21  Temp:      TempSrc:      SpO2:      Weight:      Height:       Weight change:  No intake or output data in the 24 hours ending 03/26/20 1111     Labs: Basic Metabolic Panel: Recent Labs  Lab 03/21/20 1800 03/23/20 0219 03/24/20 0621 03/25/20 0218 03/26/20 0115  NA 132*   < > 134* 133* 132*  K 4.4   < > 3.8 3.8 4.0  CL 98   < > 99 97* 97*  CO2 26   < > _0 GLUCOSE 139*   < > 90 91 95  BUN 42*   < > 32* 17 35*  CREATININE 8.15*   < > 8.38* 5.38* 7.62*  CALCIUM 7.9*   < > 8.0* 8.1* 8.4*  PHOS 3.6  --  3.7  --   --    < > = values in this interval not displayed.   Liver Function Tests: Recent Labs  Lab 03/21/20 1800 03/24/20 0621  ALBUMIN 1.8* 1.9*   No results for input(s): LIPASE, AMYLASE in the last 168 hours. No results for input(s): AMMONIA in the last 168 hours. CBC: Recent Labs  Lab 03/21/20 1800 03/23/20 0219 03/24/20 0621 03/25/20 0218 03/26/20 0115  WBC 9.8 9.9 9.2 8.7 8.3  NEUTROABS  --  6.8  --   --  6.2  HGB 7.3* 7.0* 6.9* 9.4* 9.3*  HCT 23.1* 22.4* 21.1* 28.7* 28.3*  MCV 91.7 92.6 93.0 91.4 90.7  PLT 268 225 250 188 189   Cardiac Enzymes: No results for input(s): CKTOTAL, CKMB, CKMBINDEX, TROPONINI in the last 168 hours. CBG: Recent Labs  Lab  03/24/20 2130 03/25/20 0718 03/25/20 1145 03/25/20 1623 03/25/20 2109  GLUCAP 102* 83 101* 112* 113*    Iron Studies: No results for input(s): IRON, TIBC, TRANSFERRIN, FERRITIN in the last 72 hours. Studies/Results: No results found.  Medications: Infusions: . sodium chloride 10 mL/hr at 03/14/20 1441    Scheduled Medications: . chlorhexidine gluconate (MEDLINE KIT)  15 mL Mouth Rinse BID  . [START ON 03/31/2020] darbepoetin (ARANESP) injection - DIALYSIS  100 mcg Intravenous Q Tue-HD  . dextromethorphan  30 mg Oral BID  . docusate sodium  100 mg Oral BID  . feeding supplement  1 Container Oral TID BM  . heparin sodium (porcine)      . levETIRAcetam  250 mg Oral Q  T,Th,Sa-HD  . levETIRAcetam  500 mg Oral Daily  . mouth rinse  15 mL Mouth Rinse BID  . metoprolol tartrate  75 mg Oral BID  . multivitamin  1 tablet Oral QHS  . NIFEdipine  90 mg Oral Daily  . predniSONE  30 mg Oral Q breakfast   Followed by  . [START ON 03/27/2020] predniSONE  20 mg Oral Q breakfast   Followed by  . [START ON 04/01/2020] predniSONE  10 mg Oral Q breakfast    have reviewed scheduled and prn medications.  Physical Exam: General:NAD, laying flat in bed Heart:RRR, s1s2 nl Lungs:clear b/l, no w/r/r/c, unlabored, bl chest expansion Abdomen:soft, Non-tender, non-distended Extremities: trace ankle edema bilaterally Dialysis Access: Right IJ TDC, site clean. LUE avf with good b/t  Vikas Singh 03/26/2020,11:11 AM  LOS: 30 days   

## 2020-03-26 NOTE — Plan of Care (Signed)
  Problem: Clinical Measurements: Goal: Will remain free from infection Outcome: Progressing   Problem: Clinical Measurements: Goal: Ability to maintain clinical measurements within normal limits will improve Outcome: Progressing Goal: Will remain free from infection Outcome: Progressing Goal: Diagnostic test results will improve Outcome: Progressing Goal: Respiratory complications will improve Outcome: Progressing Goal: Cardiovascular complication will be avoided Outcome: Progressing   Problem: Activity: Goal: Risk for activity intolerance will decrease Outcome: Progressing   Problem: Nutrition: Goal: Adequate nutrition will be maintained Outcome: Progressing   Problem: Coping: Goal: Level of anxiety will decrease Outcome: Progressing   Problem: Elimination: Goal: Will not experience complications related to bowel motility Outcome: Progressing Goal: Will not experience complications related to urinary retention Outcome: Progressing   Problem: Pain Managment: Goal: General experience of comfort will improve Outcome: Progressing   Problem: Safety: Goal: Ability to remain free from injury will improve Outcome: Progressing   Problem: Skin Integrity: Goal: Risk for impaired skin integrity will decrease Outcome: Progressing   Problem: Education: Goal: Knowledge of disease and its progression will improve Outcome: Progressing   Problem: Health Behavior/Discharge Planning: Goal: Ability to manage health-related needs will improve Outcome: Progressing   Problem: Clinical Measurements: Goal: Complications related to the disease process or treatment will be avoided or minimized Outcome: Progressing Goal: Dialysis access will remain free of complications Outcome: Progressing   Problem: Activity: Goal: Activity intolerance will improve Outcome: Progressing   Problem: Fluid Volume: Goal: Fluid volume balance will be maintained or improved Outcome: Progressing    Problem: Nutritional: Goal: Ability to make appropriate dietary choices will improve Outcome: Progressing   Problem: Respiratory: Goal: Respiratory symptoms related to disease process will be avoided Outcome: Progressing   Problem: Self-Concept: Goal: Body image disturbance will be avoided or minimized Outcome: Progressing   Problem: Urinary Elimination: Goal: Progression of disease will be identified and treated Outcome: Progressing   Problem: Activity: Goal: Ability to tolerate increased activity will improve Outcome: Progressing   Problem: Respiratory: Goal: Ability to maintain a clear airway and adequate ventilation will improve Outcome: Progressing   Problem: Role Relationship: Goal: Method of communication will improve Outcome: Progressing

## 2020-03-27 DIAGNOSIS — I469 Cardiac arrest, cause unspecified: Secondary | ICD-10-CM | POA: Diagnosis not present

## 2020-03-27 DIAGNOSIS — R8271 Bacteriuria: Secondary | ICD-10-CM | POA: Diagnosis not present

## 2020-03-27 DIAGNOSIS — N179 Acute kidney failure, unspecified: Secondary | ICD-10-CM | POA: Diagnosis not present

## 2020-03-27 LAB — GLUCOSE, CAPILLARY
Glucose-Capillary: 89 mg/dL (ref 70–99)
Glucose-Capillary: 84 mg/dL (ref 70–99)
Glucose-Capillary: 114 mg/dL — ABNORMAL HIGH (ref 70–99)
Glucose-Capillary: 105 mg/dL — ABNORMAL HIGH (ref 70–99)

## 2020-03-27 NOTE — Progress Notes (Signed)
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  Continue dialysis on TTS schedule with process underway for outpatient dialysis unit placement (this has been difficult due to the unique insurance coverage that the patient has).   -She had creation of a left brachiocephalic fistula by Dr. Carlis Abbott on 3/11 and is currently getting hemodialysis via a right IJ tunneled hemodialysis catheter.  Suspected healthcare associated pneumonia: On intravenous cefepime and vancomycin and remains intermittently febrile.  She has fever of unknown origin that has been worked up with imaging/cultures.  Was started her on prednisone for suspected autoimmune mechanism associated fevers, per primary.  Anemia: Likely from underlying chronic disease and surgical losses.  Ferritin level very high. Continue Aranesp and transfuse as needed. increased dose to 129mg qtuesdays  Hypertension: Blood pressure acceptable. LE  edema manage with dialysis/Ultrafiltration.  Secondary hyperparathyroidism: Calcium and phosphorus under acceptable control, not on binders at this time.  PTH level was 193 on 2/23-at goal for ESRD.  Hyponatremia: Improved/stable. Managed with dialysis, continue fluid restriction.  Rash -likely an allergic reaction? Mgmt per primary  Subjective: Seen and examined at bedside. No acute events. Tolerated HD yesterday net uf [id number]. Her biggest issue with itchiness and rash which she thought was from an anti-microbial wipe but is still persistent. No other complaints  Objective Vital signs in last 24 hours: Vitals:   03/26/20 1257 03/26/20 1900 03/27/20 0503 03/27/20 0950  BP: 104/63 109/61 116/71 114/69  Pulse: 98 (!) 102 85 89  Resp: _0 Temp: 98.7 F (37.1 C) 99.1 F  (37.3 C) 98.1 F (36.7 C) 98.6 F (37 C)  TempSrc: Oral Oral Oral Oral  SpO2: 100% 98% 99% 100%  Weight:      Height:       Weight change:   Intake/Output Summary (Last 24 hours) at 03/27/2020 1217 Last data filed at 03/26/2020 1600 Gross per 24 hour  Intake 840 ml  Output --  Net 840 ml       Labs: Basic Metabolic Panel: Recent Labs  Lab 03/21/20 1800 03/23/20 0219 03/24/20 0621 03/25/20 0218 03/26/20 0115  NA 132*   < > 134* 133* 132*  K 4.4   < > 3.8 3.8 4.0  CL 98   < > 99 97* 97*  CO2 26   < > _1 GLUCOSE 139*   < > 90 91 95  BUN 42*   < > 32* 17 35*  CREATININE 8.15*   < > 8.38* 5.38* 7.62*  CALCIUM 7.9*   < > 8.0* 8.1* 8.4*  PHOS 3.6  --  3.7  --   --    < > = values in this interval not displayed.   Liver Function Tests: Recent Labs  Lab 03/21/20 1800 03/24/20 0621  ALBUMIN 1.8* 1.9*   No results for input(s): LIPASE, AMYLASE in the last 168 hours. No results for input(s): AMMONIA in the last 168 hours. CBC: Recent Labs  Lab 03/21/20 1800 03/23/20 0219 03/24/20 0621 03/25/20 0218 03/26/20 0115  WBC 9.8 9.9 9.2 8.7 8.3  NEUTROABS  --  6.8  --   --  6.2  HGB 7.3* 7.0* 6.9* 9.4* 9.3*  HCT 23.1* 22.4* 21.1* 28.7* 28.3*  MCV 91.7 92.6 93.0 91.4 90.7  PLT 268 225 250 188 189  Cardiac Enzymes: No results for input(s): CKTOTAL, CKMB, CKMBINDEX, TROPONINI in the last 168 hours. CBG: Recent Labs  Lab 03/26/20 1255 03/26/20 1618 03/26/20 2024 03/27/20 0742 03/27/20 1145  GLUCAP 73 113* 133* 89 84    Iron Studies: No results for input(s): IRON, TIBC, TRANSFERRIN, FERRITIN in the last 72 hours. Studies/Results: No results found.  Medications: Infusions: . sodium chloride 10 mL/hr at 03/14/20 1441    Scheduled Medications: . chlorhexidine gluconate (MEDLINE KIT)  15 mL Mouth Rinse BID  . [START ON 03/31/2020] darbepoetin (ARANESP) injection - DIALYSIS  100 mcg Intravenous Q Tue-HD  . dextromethorphan  30 mg Oral BID  . docusate  sodium  100 mg Oral BID  . feeding supplement  1 Container Oral TID BM  . levETIRAcetam  250 mg Oral Q T,Th,Sa-HD  . levETIRAcetam  500 mg Oral Daily  . mouth rinse  15 mL Mouth Rinse BID  . metoprolol tartrate  75 mg Oral BID  . multivitamin  1 tablet Oral QHS  . NIFEdipine  90 mg Oral Daily  . predniSONE  20 mg Oral Q breakfast   Followed by  . [START ON 04/01/2020] predniSONE  10 mg Oral Q breakfast    have reviewed scheduled and prn medications.  Physical Exam: General:NAD, laying flat in bed Heart:RRR, s1s2 nl Lungs:clear b/l, no w/r/r/c, unlabored, bl chest expansion Abdomen:soft, Non-tender, non-distended Extremities: trace ankle edema bilaterally Skin: rash mainly on arms and legs Dialysis Access: Right IJ TDC, site clean. LUE avf with good b/t  Chiquetta Langner 03/27/2020,12:17 PM  LOS: 31 days

## 2020-03-27 NOTE — Progress Notes (Signed)
Patient ID: Barbara Crawford, female   DOB: 11-Oct-1955, 66 y.o.   MRN: [id number]  PROGRESS NOTE    Barbara Crawford  TAV:[id number] DOB: [birth year] DOA: 02/25/2020 PCP: Vonna Drafts, FNP   Brief Narrative:  65 yo female with past medical history of hypertension, prior Covid (Dec 2021) presented to the hospital with complaints of lower extremity swelling, back pain, generalized malaise, fatigue at home. In the ED,she was found to have a hemoglobin of 6.3, potassium 5.4, sodium 130, bicarb 9, BUN 144, and creatinine 29.98. CT of the abdomen and pelvis was negative for obstruction or acute abnormality. Temporary hemodialysis catheter was placed by IR and subsequently, the patient had a PEA arrest associated with seizure-like activity. She was intubated and subsequently extubated on 02/28/2020. 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 Renal biopsy was done 03/03/2019. ANA positive. ID followed the patient for fever. Dialysis cath was removed on 03/06/20 and subsequently underwent permacath placement.  During the hospitalization, patient continued to have fever so ID wasreconsulted.Extensive work-up was done. Recent x-ray had shown consolidation in the lungs. Patient was started on vancomycin and cefepime for possible healthcare associated pneumonia and subsequentlycompleted the course of antibiotic.The thought process was that there was possibility of autoimmune disease causing kidney disease and fever. She was treated with prednisone with improvement in fever.   Currently awaiting for outpatient hemodialysis arrangement.  She was noted to have slightly lower hemoglobin level on 03/24/20 requiring 1 unit PRBC transfusion.  No overt bleeding identified.  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 CT scan of the abdomen pelvis from 03/16/2020 showed evolving left perinephric hematoma without acute hemorrhagic component.  -Status post IR guided internal jugular tunneled hemodialysis catheter on 03/09/2020 -Nephrology is following and dialysis as per nephrology schedule.  Awaiting outpatient hemodialysis unit arrangement  Fever/possible healthcare associated pneumonia:  -Has completed course of vancomycin and cefepime.  Blood/urine cultures have been negative so far -Duplex ultrasound was negative for DVT. -Continue incentive spirometry -Possible autoimmune etiology of fever as well. CT scan of the abdomen pelvis with evolving left perinephric hematoma. CT scan of the chest was performed which showed mediastinal and axillary lymph nodes with decreasing bilateral pleural effusion. -Continue tapering doses of prednisone -No temperature spikes over the last 24 hours.  Anemia of chronic disease -Status post 3 units of packed red cells transfusion during this hospitalization; last transfusion was on 03/24/2020 for hemoglobin of 6.9.  Hemoglobin pending today.  Monitor  Seizure -Patient had seizure with loss of pulse and PEA arrest status post CPR.  Patient was seen by neurology during his hospitalization. -No further seizures while inpatient.  Continue current doses of Keppra.  Outpatient follow-up with neurology  Rash -Patient developed rash on 03/25/2020 after receiving CHG bath with wipes: Mostly over the thighs and arm.  CHG bath and wipes have been discontinued.  Monitor.  Use loratadine/antiallergic cream for itching.  Hyponatremia -Being managed by nephrology by dialysis.  Essential hypertension -Blood pressure stable.  Continue nifedipine, metoprolol  Acute on chronic respiratory failure with hypoxia -Resolved.  Extubated on 02/28/2020.  Currently on room  air  Cardiac arrest with pulseless electrical activity -Status post CPR and intubation.  Stable at this time  Debility/deconditioning -Will need home health PT on discharge  DVT prophylaxis: SCDs Code Status: Full Family Communication: None at bedside Disposition Plan: Status is: Inpatient  Remains inpatient appropriate because:Inpatient level of care appropriate due to severity of illness   Dispo:  Patient From: Home  Planned Disposition: Home with home health as soon as outpatient hemodialysis unit is arranged  Medically stable for discharge: Yes   Consultants: PCCM/nephrology/IR/ID/vascular surgery/neurology  Procedures: Intubation/extubation Renal biopsy IR guided tunneled dialysis catheter placement  Antimicrobials:  Anti-infectives (From admission, onward)   Start     Dose/Rate Route Frequency Ordered Stop   03/18/20 0900  ceFEPIme (MAXIPIME) 2 g in sodium chloride 0.9 % 100 mL IVPB        2 g 200 mL/hr over 30 Minutes Intravenous  Once 03/18/20 0751 03/18/20 0953   03/17/20 1200  vancomycin (VANCOCIN) IVPB 750 mg/150 ml premix        750 mg 150 mL/hr over 60 Minutes Intravenous Every T-Th-Sa (Hemodialysis) 03/14/20 1027 03/19/20 1158   03/14/20 1200  vancomycin (VANCOCIN) IVPB 1000 mg/200 mL premix        1,000 mg 200 mL/hr over 60 Minutes Intravenous  Once 03/14/20 1027 03/14/20 1641   03/14/20 1200  ceFEPIme (MAXIPIME) 2 g in sodium chloride 0.9 % 100 mL IVPB        2 g 200 mL/hr over 30 Minutes Intravenous Every T-Th-Sa (Hemodialysis) 03/14/20 1027 03/21/20 1159   03/14/20 0600  vancomycin (VANCOREADY) IVPB 1000 mg/200 mL        1,000 mg 200 mL/hr over 60 Minutes Intravenous To Short Stay 03/13/20 0712 03/13/20 0954   03/09/20 0600  vancomycin (VANCOREADY) IVPB 1000 mg/200 mL        1,000 mg 200 mL/hr over 60 Minutes Intravenous On call 03/06/20 1647 03/09/20 1542   03/06/20 1200  vancomycin (VANCOCIN) IVPB 750 mg/150 ml premix  Status:  Discontinued         750 mg 150 mL/hr over 60 Minutes Intravenous  Once 03/05/20 1426 03/05/20 1447   03/05/20 1800  vancomycin (VANCOCIN) IVPB 750 mg/150 ml premix  Status:  Discontinued        750 mg 150 mL/hr over 60 Minutes Intravenous  Once 03/05/20 0831 03/05/20 0834   03/05/20 0930  vancomycin (VANCOREADY) IVPB 1500 mg/300 mL  Status:  Discontinued        1,500 mg 150 mL/hr over 120 Minutes Intravenous  Once 03/05/20 0831 03/05/20 1447   03/05/20 0930  ceFEPIme (MAXIPIME) 1 g in sodium chloride 0.9 % 100 mL IVPB  Status:  Discontinued        1 g 200 mL/hr over 30 Minutes Intravenous Every 24 hours 03/05/20 0831 03/05/20 1447   02/29/20 1000  cefTRIAXone (ROCEPHIN) 2 g in sodium chloride 0.9 % 100 mL IVPB  Status:  Discontinued        2 g 200 mL/hr over 30 Minutes Intravenous Every 24 hours 02/29/20 0910 03/05/20 0756       Subjective: Patient seen and examined at bedside.  No overnight fever, vomiting, worsening shortness of breath reported.  Itching is improving. Objective: Vitals:   03/26/20 1154 03/26/20 1257 03/26/20 1900 03/27/20 0503  BP: 107/77 104/63 109/61 116/71  Pulse: 95 98 (!) 102 85  Resp: _0 Temp: 98.8 F (37.1 C) 98.7 F (37.1 C) 99.1 F (37.3 C) 98.1 F (36.7 C)  TempSrc: Oral Oral Oral Oral  SpO2: 96% 100% 98% 99%  Weight: 58.8 kg     Height:  Intake/Output Summary (Last 24 hours) at 03/27/2020 0715 Last data filed at 03/26/2020 1600 Gross per 24 hour  Intake 840 ml  Output 1568 ml  Net -728 ml   Filed Weights   03/24/20 1043 03/26/20 0735 03/26/20 1154  Weight: 61.2 kg 60.8 kg 58.8 kg    Examination:  General exam: Currently on room air.  No distress.   Respiratory system: Bilateral decreased breath sounds bases with scattered crackles cardiovascular system: S1-S2 heard, rate controlled  gastrointestinal system: Abdomen is slightly distended, soft and nontender.  Normal bowel sounds heard  extremities: No cyanosis; trace lower extremity edema  present  Data Reviewed: I have personally reviewed following labs and imaging studies  CBC: Recent Labs  Lab 03/21/20 1800 03/23/20 0219 03/24/20 0621 03/25/20 0218 03/26/20 0115  WBC 9.8 9.9 9.2 8.7 8.3  NEUTROABS  --  6.8  --   --  6.2  HGB 7.3* 7.0* 6.9* 9.4* 9.3*  HCT 23.1* 22.4* 21.1* 28.7* 28.3*  MCV 91.7 92.6 93.0 91.4 90.7  PLT 268 225 250 188 276   Basic Metabolic Panel: Recent Labs  Lab 03/21/20 1800 03/23/20 0219 03/24/20 0621 03/25/20 0218 03/26/20 0115  NA 132* 135 134* 133* 132*  K 4.4 3.7 3.8 3.8 4.0  CL 98 99 99 97* 97*  CO2 _0 GLUCOSE 139* 90 90 91 95  BUN 42* 23 32* 17 35*  CREATININE 8.15* 5.92* 8.38* 5.38* 7.62*  CALCIUM 7.9* 8.3* 8.0* 8.1* 8.4*  MG  --   --   --  1.8  --   PHOS 3.6  --  3.7  --   --    GFR: Estimated Creatinine Clearance: 6.3 mL/min (A) (by C-G formula based on SCr of 7.62 mg/dL (H)). Liver Function Tests: Recent Labs  Lab 03/21/20 1800 03/24/20 0621  ALBUMIN 1.8* 1.9*   No results for input(s): LIPASE, AMYLASE in the last 168 hours. No results for input(s): AMMONIA in the last 168 hours. Coagulation Profile: No results for input(s): INR, PROTIME in the last 168 hours. Cardiac Enzymes: No results for input(s): CKTOTAL, CKMB, CKMBINDEX, TROPONINI in the last 168 hours. BNP (last 3 results) No results for input(s): PROBNP in the last 8760 hours. HbA1C: No results for input(s): HGBA1C in the last 72 hours. CBG: Recent Labs  Lab 03/25/20 1623 03/25/20 2109 03/26/20 1255 03/26/20 1618 03/26/20 2024  GLUCAP 112* 113* 73 113* 133*   Lipid Profile: No results for input(s): CHOL, HDL, LDLCALC, TRIG, CHOLHDL, LDLDIRECT in the last 72 hours. Thyroid Function Tests: No results for input(s): TSH, T4TOTAL, FREET4, T3FREE, THYROIDAB in the last 72 hours. Anemia Panel: No results for input(s): VITAMINB12, FOLATE, FERRITIN, TIBC, IRON, RETICCTPCT in the last 72 hours. Sepsis Labs: No results for input(s):  PROCALCITON, LATICACIDVEN in the last 168 hours.  No results found for this or any previous visit (from the past 240 hour(s)).       Radiology Studies: No results found.      Scheduled Meds: . chlorhexidine gluconate (MEDLINE KIT)  15 mL Mouth Rinse BID  . [START ON 03/31/2020] darbepoetin (ARANESP) injection - DIALYSIS  100 mcg Intravenous Q Tue-HD  . dextromethorphan  30 mg Oral BID  . docusate sodium  100 mg Oral BID  . feeding supplement  1 Container Oral TID BM  . levETIRAcetam  250 mg Oral Q T,Th,Sa-HD  . levETIRAcetam  500 mg Oral Daily  . mouth rinse  15 mL Mouth Rinse BID  .  metoprolol tartrate  75 mg Oral BID  . multivitamin  1 tablet Oral QHS  . NIFEdipine  90 mg Oral Daily  . predniSONE  20 mg Oral Q breakfast   Followed by  . [START ON 04/01/2020] predniSONE  10 mg Oral Q breakfast   Continuous Infusions: . sodium chloride 10 mL/hr at 03/14/20 1441          Gladyse Corvin, MD [facility] 03/27/2020, 7:15 AM

## 2020-03-27 NOTE — Progress Notes (Signed)
Occupational Therapy Treatment Patient Details Name: Barbara Crawford MRN: [id number] DOB: 05/20/1955 Today's Date: 03/27/2020    History of present illness 65 y.o. female with medical history significant of HTN. Pt had COVID in Dec, 1 week prior to presenting to ED 02/25/20 she developed swelling in BLE. Developed pain in B lower back; CT abd/pelvis - no obstruction or acute abnormality. HGB 6.3, +acute renal failure; 2/23 seizure and Code Blue with CPR x 4 minutes and intubated 2/23-2/25; CRRT; renal biopsy 2/28 with perinephric hematoma; pneumonia;  IJ catheter placed and HD initiated. Plans to have dialysis access placed 3/11   OT comments  Pt continues to make good progress with increasing endurance, and independence with self care activities.  She moves slowly, and does fatigue, but is able to pace self.  Reinforced energy conservation strategies.   Follow Up Recommendations  Home health OT    Equipment Recommendations  3 in 1 bedside commode    Recommendations for Other Services      Precautions / Restrictions Precautions Precautions: Fall       Mobility Bed Mobility               General bed mobility comments: Pt up in recliner    Transfers Overall transfer level: Needs assistance Equipment used: None;Standard walker Transfers: Sit to/from Stand;Stand Pivot Transfers Sit to Stand: Supervision Stand pivot transfers: Min guard       General transfer comment: supervision for safety    Balance           Standing balance support: No upper extremity supported;During functional activity Standing balance-Leahy Scale: Good                             ADL either performed or assessed with clinical judgement   ADL Overall ADL's : Needs assistance/impaired     Grooming: Wash/dry hands;Wash/dry face;Oral care;Supervision/safety;Standing                   Toilet Transfer: Supervision/safety;Ambulation;Comfort height toilet;RW            Functional mobility during ADLs: Supervision/safety;Rolling walker       Vision       Perception     Praxis      Cognition Arousal/Alertness: Awake/alert Behavior During Therapy: WFL for tasks assessed/performed Overall Cognitive Status: Within Functional Limits for tasks assessed                                 General Comments: Grossly WFL.  Pt paying bills upon OT entrance        Exercises     Shoulder Instructions       General Comments Pt required one rest break due to fatigue.  Moves slowly.  Reinforced energy conservation strategies and pacing self    Pertinent Vitals/ Pain       Pain Assessment: No/denies pain  Home Living                                          Prior Functioning/Environment              Frequency  Min 2X/week        Progress Toward Goals  OT Goals(current goals can now be found in the care plan  section)  Progress towards OT goals: Progressing toward goals     Plan Discharge plan remains appropriate    Co-evaluation                 AM-PAC OT "6 Clicks" Daily Activity     Outcome Measure   Help from another person eating meals?: None Help from another person taking care of personal grooming?: A Little Help from another person toileting, which includes using toliet, bedpan, or urinal?: A Little Help from another person bathing (including washing, rinsing, drying)?: A Little Help from another person to put on and taking off regular upper body clothing?: A Little Help from another person to put on and taking off regular lower body clothing?: A Little 6 Click Score: 19    End of Session Equipment Utilized During Treatment: Rolling walker  OT Visit Diagnosis: Unsteadiness on feet (R26.81);Muscle weakness (generalized) (M62.81)   Activity Tolerance Patient tolerated treatment well   Patient Left in chair;with call bell/phone within reach   Nurse Communication Mobility  status        Time: [id number] OT Time Calculation (min): 17 min  Charges: OT General Charges $OT Visit: 1 Visit OT Treatments $Self Care/Home Management : 8-22 mins  Nilsa Nutting OTR/L Acute Rehabilitation Services Pager [phone] Office [phone]    Lucille Passy M 03/27/2020, 2:53 PM

## 2020-03-27 NOTE — Plan of Care (Signed)
  Problem: Clinical Measurements: Goal: Will remain free from infection Outcome: Progressing   Problem: Clinical Measurements: Goal: Ability to maintain clinical measurements within normal limits will improve Outcome: Progressing Goal: Will remain free from infection Outcome: Progressing Goal: Diagnostic test results will improve Outcome: Progressing Goal: Respiratory complications will improve Outcome: Progressing Goal: Cardiovascular complication will be avoided Outcome: Progressing   Problem: Activity: Goal: Risk for activity intolerance will decrease Outcome: Progressing   Problem: Nutrition: Goal: Adequate nutrition will be maintained Outcome: Progressing

## 2020-03-28 DIAGNOSIS — N051 Unspecified nephritic syndrome with focal and segmental glomerular lesions: Secondary | ICD-10-CM | POA: Diagnosis not present

## 2020-03-28 DIAGNOSIS — I469 Cardiac arrest, cause unspecified: Secondary | ICD-10-CM | POA: Diagnosis not present

## 2020-03-28 DIAGNOSIS — N179 Acute kidney failure, unspecified: Secondary | ICD-10-CM | POA: Diagnosis not present

## 2020-03-28 LAB — TYPE AND SCREEN
ABO/RH(D): O POS
Antibody Screen: NEGATIVE
Unit division: 0
Unit division: 0

## 2020-03-28 LAB — RENAL FUNCTION PANEL
Albumin: 2.3 g/dL — ABNORMAL LOW (ref 3.5–5.0)
Anion gap: 9 (ref 5–15)
BUN: 32 mg/dL — ABNORMAL HIGH (ref 8–23)
CO2: 26 mmol/L (ref 22–32)
Calcium: 8.5 mg/dL — ABNORMAL LOW (ref 8.9–10.3)
Chloride: 97 mmol/L — ABNORMAL LOW (ref 98–111)
Creatinine, Ser: 7.6 mg/dL — ABNORMAL HIGH (ref 0.44–1.00)
GFR, Estimated: 6 mL/min — ABNORMAL LOW (ref >60)
Glucose, Bld: 101 mg/dL — ABNORMAL HIGH (ref 70–99)
Phosphorus: 3.4 mg/dL (ref 2.5–4.6)
Potassium: 4 mmol/L (ref 3.5–5.1)
Sodium: 132 mmol/L — ABNORMAL LOW (ref 135–145)

## 2020-03-28 LAB — GLUCOSE, CAPILLARY
Glucose-Capillary: 118 mg/dL — ABNORMAL HIGH (ref 70–99)
Glucose-Capillary: 90 mg/dL (ref 70–99)
Glucose-Capillary: 96 mg/dL (ref 70–99)
Glucose-Capillary: 108 mg/dL — ABNORMAL HIGH (ref 70–99)

## 2020-03-28 LAB — BPAM RBC
Blood Product Expiration Date: 202204202359
Blood Product Expiration Date: 202204212359
ISSUE DATE / TIME: 202203220940
Unit Type and Rh: 5100
Unit Type and Rh: 5100

## 2020-03-28 LAB — CBC
HCT: 29.5 % — ABNORMAL LOW (ref 36.0–46.0)
Hemoglobin: 9.2 g/dL — ABNORMAL LOW (ref 12.0–15.0)
MCH: 29.5 pg (ref 26.0–34.0)
MCHC: 31.2 g/dL (ref 30.0–36.0)
MCV: 94.6 fL (ref 80.0–100.0)
Platelets: 165 10*3/uL (ref 150–400)
RBC: 3.12 MIL/uL — ABNORMAL LOW (ref 3.87–5.11)
RDW: 16.8 % — ABNORMAL HIGH (ref 11.5–15.5)
WBC: 7 10*3/uL (ref 4.0–10.5)
nRBC: 0 % (ref 0.0–0.2)

## 2020-03-28 MED ORDER — HEPARIN SODIUM (PORCINE) 1000 UNIT/ML DIALYSIS: 40.0000 [IU]/kg | INTRAMUSCULAR | Status: DC | PRN

## 2020-03-28 MED ORDER — HEPARIN SODIUM (PORCINE) 1000 UNIT/ML IJ SOLN
INTRAMUSCULAR | Status: AC
  Administered 2020-03-28: 1000 [IU]
  Filled 2020-03-28: qty 1

## 2020-03-28 NOTE — Procedures (Signed)
I was present at this dialysis session. I have reviewed the session itself and made appropriate changes.   Filed Weights   03/26/20 1154 03/28/20 0839 03/28/20 1244  Weight: 58.8 kg 60 kg 57 kg    Recent Labs  Lab 03/28/20 0851  NA 132*  K 4.0  CL 97*  CO2 26  GLUCOSE 101*  BUN 32*  CREATININE 7.60*  CALCIUM 8.5*  PHOS 3.4    Recent Labs  Lab 03/23/20 0219 03/24/20 0621 03/25/20 0218 03/26/20 0115 03/28/20 0851  WBC 9.9   < > 8.7 8.3 7.0  NEUTROABS 6.8  --   --  6.2  --   HGB 7.0*   < > 9.4* 9.3* 9.2*  HCT 22.4*   < > 28.7* 28.3* 29.5*  MCV 92.6   < > 91.4 90.7 94.6  PLT 225   < > 188 189 165   < > = values in this interval not displayed.    Scheduled Meds: . chlorhexidine gluconate (MEDLINE KIT)  15 mL Mouth Rinse BID  . [START ON 03/31/2020] darbepoetin (ARANESP) injection - DIALYSIS  100 mcg Intravenous Q Tue-HD  . dextromethorphan  30 mg Oral BID  . docusate sodium  100 mg Oral BID  . feeding supplement  1 Container Oral TID BM  . levETIRAcetam  250 mg Oral Q T,Th,Sa-HD  . levETIRAcetam  500 mg Oral Daily  . mouth rinse  15 mL Mouth Rinse BID  . metoprolol tartrate  75 mg Oral BID  . multivitamin  1 tablet Oral QHS  . NIFEdipine  90 mg Oral Daily  . predniSONE  20 mg Oral Q breakfast   Followed by  . [START ON 04/01/2020] predniSONE  10 mg Oral Q breakfast   Continuous Infusions: . sodium chloride 10 mL/hr at 03/14/20 1441   PRN Meds:.acetaminophen, diphenhydrAMINE-zinc acetate, guaiFENesin, labetalol, loratadine, LORazepam, melatonin, menthol-cetylpyridinium, ondansetron **OR** ondansetron (ZOFRAN) IV, oxyCODONE   Gean Quint, MD [facility] 03/28/2020, 2:32 PM

## 2020-03-28 NOTE — Progress Notes (Signed)
Patient ID: Barbara Crawford, female   DOB: 1955-09-24, 65 y.o.   MRN: [id number]  PROGRESS NOTE    Alejandra Hunt  RRN:[id number] DOB: 1955-01-11 DOA: 02/25/2020 PCP: Vonna Drafts, FNP   Brief Narrative:  65 yo female with past medical history of hypertension, prior Covid (Dec 2021) presented to the hospital with complaints of lower extremity swelling, back pain, generalized malaise, fatigue at home. In the ED,she was found to have a hemoglobin of 6.3, potassium 5.4, sodium 130, bicarb 9, BUN 144, and creatinine 29.98. CT of the abdomen and pelvis was negative for obstruction or acute abnormality. Temporary hemodialysis catheter was placed by IR and subsequently, the patient had a PEA arrest associated with seizure-like activity. She was intubated and subsequently extubated on 02/28/2020. 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 Renal biopsy was done 03/03/2019. ANA positive. ID followed the patient for fever. Dialysis cath was removed on 03/06/20 and subsequently underwent permacath placement.  During the hospitalization, patient continued to have fever so ID wasreconsulted.Extensive work-up was done. Recent x-ray had shown consolidation in the lungs. Patient was started on vancomycin and cefepime for possible healthcare associated pneumonia and subsequentlycompleted the course of antibiotic.The thought process was that there was possibility of autoimmune disease causing kidney disease and fever. She was treated with prednisone with improvement in fever.   Currently awaiting for outpatient hemodialysis arrangement.  She was noted to have slightly lower hemoglobin level on 03/24/20 requiring 1 unit PRBC transfusion.  No overt bleeding identified.  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 CT scan of the abdomen pelvis from 03/16/2020 showed evolving left perinephric hematoma without acute hemorrhagic component.  -Status post IR guided internal jugular tunneled hemodialysis catheter on 03/09/2020 -Nephrology is following and dialysis as per nephrology schedule.  Awaiting outpatient hemodialysis unit arrangement  Fever/possible healthcare associated pneumonia:  -Has completed course of vancomycin and cefepime.  Blood/urine cultures have been negative so far -Duplex ultrasound was negative for DVT. -Continue incentive spirometry -Possible autoimmune etiology of fever as well. CT scan of the abdomen pelvis with evolving left perinephric hematoma. CT scan of the chest was performed which showed mediastinal and axillary lymph nodes with decreasing bilateral pleural effusion. -Continue tapering doses of prednisone -No temperature spikes over the last 48 hours.  Anemia of chronic disease -Status post 3 units of packed red cells transfusion during this hospitalization; last transfusion was on 03/24/2020 for hemoglobin of 6.9.  Hemoglobin pending today.  Monitor  Seizure -Patient had seizure with loss of pulse and PEA arrest status post CPR.  Patient was seen by neurology during his hospitalization. -No further seizures while inpatient.  Continue current doses of Keppra.  Outpatient follow-up with neurology  Rash -Patient developed rash on 03/25/2020 after receiving CHG bath with wipes: Mostly over the thighs and arm.  CHG bath and wipes have been discontinued.  Monitor.  Use loratadine/antiallergic cream for itching.  Hyponatremia -Being managed by nephrology by dialysis.  Essential hypertension -Blood pressure stable.  Continue nifedipine, metoprolol  Acute on chronic respiratory failure with hypoxia -Resolved.  Extubated on 02/28/2020.  Currently on room  air  Cardiac arrest with pulseless electrical activity -Status post CPR and intubation.  Stable at this time  Debility/deconditioning -Will need home health PT on discharge  DVT prophylaxis: SCDs Code Status: Full Family Communication: None at bedside Disposition Plan: Status is: Inpatient  Remains inpatient appropriate because:Inpatient level of care appropriate due to severity of illness   Dispo:  Patient From: Home  Planned Disposition: Home with home health as soon as outpatient hemodialysis unit is arranged  Medically stable for discharge: Yes   Consultants: PCCM/nephrology/IR/ID/vascular surgery/neurology  Procedures: Intubation/extubation Renal biopsy IR guided tunneled dialysis catheter placement  Antimicrobials:  Anti-infectives (From admission, onward)   Start     Dose/Rate Route Frequency Ordered Stop   03/18/20 0900  ceFEPIme (MAXIPIME) 2 g in sodium chloride 0.9 % 100 mL IVPB        2 g 200 mL/hr over 30 Minutes Intravenous  Once 03/18/20 0751 03/18/20 0953   03/17/20 1200  vancomycin (VANCOCIN) IVPB 750 mg/150 ml premix        750 mg 150 mL/hr over 60 Minutes Intravenous Every T-Th-Sa (Hemodialysis) 03/14/20 1027 03/19/20 1158   03/14/20 1200  vancomycin (VANCOCIN) IVPB 1000 mg/200 mL premix        1,000 mg 200 mL/hr over 60 Minutes Intravenous  Once 03/14/20 1027 03/14/20 1641   03/14/20 1200  ceFEPIme (MAXIPIME) 2 g in sodium chloride 0.9 % 100 mL IVPB        2 g 200 mL/hr over 30 Minutes Intravenous Every T-Th-Sa (Hemodialysis) 03/14/20 1027 03/21/20 1159   03/14/20 0600  vancomycin (VANCOREADY) IVPB 1000 mg/200 mL        1,000 mg 200 mL/hr over 60 Minutes Intravenous To Short Stay 03/13/20 0712 03/13/20 0954   03/09/20 0600  vancomycin (VANCOREADY) IVPB 1000 mg/200 mL        1,000 mg 200 mL/hr over 60 Minutes Intravenous On call 03/06/20 1647 03/09/20 1542   03/06/20 1200  vancomycin (VANCOCIN) IVPB 750 mg/150 ml premix  Status:  Discontinued         750 mg 150 mL/hr over 60 Minutes Intravenous  Once 03/05/20 1426 03/05/20 1447   03/05/20 1800  vancomycin (VANCOCIN) IVPB 750 mg/150 ml premix  Status:  Discontinued        750 mg 150 mL/hr over 60 Minutes Intravenous  Once 03/05/20 0831 03/05/20 0834   03/05/20 0930  vancomycin (VANCOREADY) IVPB 1500 mg/300 mL  Status:  Discontinued        1,500 mg 150 mL/hr over 120 Minutes Intravenous  Once 03/05/20 0831 03/05/20 1447   03/05/20 0930  ceFEPIme (MAXIPIME) 1 g in sodium chloride 0.9 % 100 mL IVPB  Status:  Discontinued        1 g 200 mL/hr over 30 Minutes Intravenous Every 24 hours 03/05/20 0831 03/05/20 1447   02/29/20 1000  cefTRIAXone (ROCEPHIN) 2 g in sodium chloride 0.9 % 100 mL IVPB  Status:  Discontinued        2 g 200 mL/hr over 30 Minutes Intravenous Every 24 hours 02/29/20 0910 03/05/20 0756       Subjective: Patient seen and examined at bedside undergoing dialysis.  Denies worsening shortness of breath, vomiting, fever or abdominal pain. Objective: Vitals:   03/27/20 0503 03/27/20 0950 03/27/20 2034 03/28/20 0545  BP: 116/71 114/69 102/66 110/63  Pulse: 85 89 85 82  Resp: _0 Temp: 98.1 F (36.7 C) 98.6 F (37 C) 98.1 F (36.7 C) 98.5 F (36.9 C)  TempSrc: Oral Oral Oral Oral  SpO2: 99% 100% 99% 100%  Weight:      Height:  Intake/Output Summary (Last 24 hours) at 03/28/2020 0729 Last data filed at 03/27/2020 1830 Gross per 24 hour  Intake 960 ml  Output --  Net 960 ml   Filed Weights   03/24/20 1043 03/26/20 0735 03/26/20 1154  Weight: 61.2 kg 60.8 kg 58.8 kg    Examination:  General exam: No acute distress.  On room air currently. Respiratory system: Decreased breath sounds at bases bilaterally  cardiovascular system: Rate controlled, S1-S2 heard gastrointestinal system: Abdomen is distended mildly, soft and nontender.  Bowel sounds are heard  extremities: Mild lower extremity edema present; no clubbing  Data Reviewed: I have  personally reviewed following labs and imaging studies  CBC: Recent Labs  Lab 03/21/20 1800 03/23/20 0219 03/24/20 0621 03/25/20 0218 03/26/20 0115  WBC 9.8 9.9 9.2 8.7 8.3  NEUTROABS  --  6.8  --   --  6.2  HGB 7.3* 7.0* 6.9* 9.4* 9.3*  HCT 23.1* 22.4* 21.1* 28.7* 28.3*  MCV 91.7 92.6 93.0 91.4 90.7  PLT 268 225 250 188 160   Basic Metabolic Panel: Recent Labs  Lab 03/21/20 1800 03/23/20 0219 03/24/20 0621 03/25/20 0218 03/26/20 0115  NA 132* 135 134* 133* 132*  K 4.4 3.7 3.8 3.8 4.0  CL 98 99 99 97* 97*  CO2 _0 GLUCOSE 139* 90 90 91 95  BUN 42* 23 32* 17 35*  CREATININE 8.15* 5.92* 8.38* 5.38* 7.62*  CALCIUM 7.9* 8.3* 8.0* 8.1* 8.4*  MG  --   --   --  1.8  --   PHOS 3.6  --  3.7  --   --    GFR: Estimated Creatinine Clearance: 6.3 mL/min (A) (by C-G formula based on SCr of 7.62 mg/dL (H)). Liver Function Tests: Recent Labs  Lab 03/21/20 1800 03/24/20 0621  ALBUMIN 1.8* 1.9*   No results for input(s): LIPASE, AMYLASE in the last 168 hours. No results for input(s): AMMONIA in the last 168 hours. Coagulation Profile: No results for input(s): INR, PROTIME in the last 168 hours. Cardiac Enzymes: No results for input(s): CKTOTAL, CKMB, CKMBINDEX, TROPONINI in the last 168 hours. BNP (last 3 results) No results for input(s): PROBNP in the last 8760 hours. HbA1C: No results for input(s): HGBA1C in the last 72 hours. CBG: Recent Labs  Lab 03/26/20 2024 03/27/20 0742 03/27/20 1145 03/27/20 1622 03/27/20 2032  GLUCAP 133* 89 84 114* 105*   Lipid Profile: No results for input(s): CHOL, HDL, LDLCALC, TRIG, CHOLHDL, LDLDIRECT in the last 72 hours. Thyroid Function Tests: No results for input(s): TSH, T4TOTAL, FREET4, T3FREE, THYROIDAB in the last 72 hours. Anemia Panel: No results for input(s): VITAMINB12, FOLATE, FERRITIN, TIBC, IRON, RETICCTPCT in the last 72 hours. Sepsis Labs: No results for input(s): PROCALCITON, LATICACIDVEN in the last  168 hours.  No results found for this or any previous visit (from the past 240 hour(s)).       Radiology Studies: No results found.      Scheduled Meds:  chlorhexidine gluconate (MEDLINE KIT)  15 mL Mouth Rinse BID   [START ON 03/31/2020] darbepoetin (ARANESP) injection - DIALYSIS  100 mcg Intravenous Q Tue-HD   dextromethorphan  30 mg Oral BID   docusate sodium  100 mg Oral BID   feeding supplement  1 Container Oral TID BM   levETIRAcetam  250 mg Oral Q T,Th,Sa-HD   levETIRAcetam  500 mg Oral Daily   mouth rinse  15 mL Mouth Rinse BID   metoprolol tartrate  75 mg Oral BID   multivitamin  1 tablet Oral QHS   NIFEdipine  90 mg Oral Daily   predniSONE  20 mg Oral Q breakfast   Followed by   Derrill Memo ON 04/01/2020] predniSONE  10 mg Oral Q breakfast   Continuous Infusions:  sodium chloride 10 mL/hr at 03/14/20 1441          Butler Vegh, MD [facility] 03/28/2020, 7:29 AM

## 2020-03-28 NOTE — Progress Notes (Signed)
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  Continue dialysis on TTS schedule with process underway for outpatient dialysis unit placement (this has been difficult due to the unique insurance coverage that the patient has, this hopefully will be resolved very soon). -She had creation of a left brachiocephalic fistula by Dr. Carlis Abbott on 3/11 and is currently getting hemodialysis via a right IJ tunneled hemodialysis catheter.  Suspected healthcare associated pneumonia: On intravenous cefepime and vancomycin and remains intermittently febrile.  She has fever of unknown origin that has been worked up with imaging/cultures.  Was started her on prednisone for suspected autoimmune mechanism associated fevers, per primary.  Anemia: Likely from underlying chronic disease and surgical losses.  Ferritin level very high. Continue Aranesp and transfuse as needed. increased dose to 155mg qtuesdays  Hypertension: Blood pressure acceptable. LE  edema manage with dialysis/Ultrafiltration.  Secondary hyperparathyroidism: Calcium and phosphorus under acceptable control, not on binders at this time.  PTH level was 193 on 2/23-at goal for ESRD.  Hyponatremia: Improved/stable. Managed with dialysis, continue fluid restriction.  Rash -probably stress related hives. Already on prednisone. Avoid anti-microbial wipes here. Per priamry  Subjective: Seen and examined at bedside this AM during HD. Rash lkely related to stress. Has been improving when trying to clear her mind and think positively. Seems to be working. Tolerating HD  Objective Vital signs in last 24 hours: Vitals:   03/28/20 1230 03/28/20 1244 03/28/20 1300 03/28/20 1352  BP: 106/70 (!) 104/58 101/61 (!) 77/51  Pulse: 95 (!) 101  (!) 101 (!) 104  Resp:  (!) 21  17  Temp:  98.8 F (37.1 C)  100.2 F (37.9 C)  TempSrc:  Oral    SpO2:  100%  99%  Weight:  57 kg    Height:       Weight change:   Intake/Output Summary (Last 24 hours) at 03/28/2020 1432 Last data filed at 03/28/2020 1244 Gross per 24 hour  Intake 600 ml  Output 3000 ml  Net -2400 ml       Labs: Basic Metabolic Panel: Recent Labs  Lab 03/21/20 1800 03/23/20 0219 03/24/20 0621 03/25/20 0218 03/26/20 0115 03/28/20 0851  NA 132*   < > 134* 133* 132* 132*  K 4.4   < > 3.8 3.8 4.0 4.0  CL 98   < > 99 97* 97* 97*  CO2 26   < > 26 27 25 26   GLUCOSE 139*   < > 90 91 95 101*  BUN 42*   < > 32* 17 35* 32*  CREATININE 8.15*   < > 8.38* 5.38* 7.62* 7.60*  CALCIUM 7.9*   < > 8.0* 8.1* 8.4* 8.5*  PHOS 3.6  --  3.7  --   --  3.4   < > = values in this interval not displayed.   Liver Function Tests: Recent Labs  Lab 03/21/20 1800 03/24/20 0621 03/28/20 0851  ALBUMIN 1.8* 1.9* 2.3*   No results for input(s): LIPASE, AMYLASE in the last 168 hours. No results for input(s): AMMONIA in the last 168 hours. CBC: Recent Labs  Lab 03/23/20 0219 03/24/20 0621 03/25/20 0218 03/26/20 0115 03/28/20 0851  WBC 9.9 9.2 8.7 8.3 7.0  NEUTROABS 6.8  --   --  6.2  --   HGB 7.0* 6.9* 9.4* 9.3* 9.2*  HCT 22.4* 21.1* 28.7* 28.3* 29.5*  MCV 92.6 93.0 91.4 90.7 94.6  PLT 225 250 188 189 165   Cardiac Enzymes: No results for input(s): CKTOTAL, CKMB, CKMBINDEX, TROPONINI in the last 168 hours. CBG: Recent Labs  Lab 03/27/20 1145 03/27/20 1622 03/27/20 2032 03/28/20 0746 03/28/20 1350  GLUCAP 84 114* 105* 118* 90    Iron Studies: No results for input(s): IRON, TIBC, TRANSFERRIN, FERRITIN in the last 72 hours. Studies/Results: No results found.  Medications: Infusions: . sodium chloride 10 mL/hr at 03/14/20 1441    Scheduled Medications: . chlorhexidine gluconate (MEDLINE KIT)  15 mL Mouth Rinse BID  . [START ON 03/31/2020] darbepoetin  (ARANESP) injection - DIALYSIS  100 mcg Intravenous Q Tue-HD  . dextromethorphan  30 mg Oral BID  . docusate sodium  100 mg Oral BID  . feeding supplement  1 Container Oral TID BM  . levETIRAcetam  250 mg Oral Q T,Th,Sa-HD  . levETIRAcetam  500 mg Oral Daily  . mouth rinse  15 mL Mouth Rinse BID  . metoprolol tartrate  75 mg Oral BID  . multivitamin  1 tablet Oral QHS  . NIFEdipine  90 mg Oral Daily  . predniSONE  20 mg Oral Q breakfast   Followed by  . [START ON 04/01/2020] predniSONE  10 mg Oral Q breakfast    have reviewed scheduled and prn medications.  Physical Exam: General:NAD, laying flat in bed Heart:RRR, s1s2 nl Lungs:clear b/l, no w/r/r/c, unlabored, bl chest expansion Abdomen:soft, Non-tender, non-distended Extremities: trace ankle edema bilaterally Skin: rash mainly on arms and legs Dialysis Access: Right IJ TDC, site clean. LUE avf with good b/t  Shunna Mikaelian 03/28/2020,2:32 PM  LOS: 32 days

## 2020-03-29 ENCOUNTER — Inpatient Hospital Stay (HOSPITAL_COMMUNITY): Payer: 59

## 2020-03-29 DIAGNOSIS — I469 Cardiac arrest, cause unspecified: Secondary | ICD-10-CM | POA: Diagnosis not present

## 2020-03-29 DIAGNOSIS — R0989 Other specified symptoms and signs involving the circulatory and respiratory systems: Secondary | ICD-10-CM

## 2020-03-29 DIAGNOSIS — N179 Acute kidney failure, unspecified: Secondary | ICD-10-CM | POA: Diagnosis not present

## 2020-03-29 DIAGNOSIS — N051 Unspecified nephritic syndrome with focal and segmental glomerular lesions: Secondary | ICD-10-CM | POA: Diagnosis not present

## 2020-03-29 DIAGNOSIS — R8271 Bacteriuria: Secondary | ICD-10-CM | POA: Diagnosis not present

## 2020-03-29 LAB — GLUCOSE, CAPILLARY
Glucose-Capillary: 94 mg/dL (ref 70–99)
Glucose-Capillary: 89 mg/dL (ref 70–99)
Glucose-Capillary: 87 mg/dL (ref 70–99)
Glucose-Capillary: 122 mg/dL — ABNORMAL HIGH (ref 70–99)
Glucose-Capillary: 109 mg/dL — ABNORMAL HIGH (ref 70–99)

## 2020-03-29 MED ORDER — NIFEDIPINE ER OSMOTIC RELEASE 30 MG PO TB24
30.0000 mg | ORAL_TABLET | Freq: Every day | ORAL | Status: DC
Start: 2020-03-29 — End: 2020-04-04
  Administered 2020-03-29 – 2020-04-04 (×7): 30 mg via ORAL
  Filled 2020-03-29 (×7): qty 1

## 2020-03-29 NOTE — Progress Notes (Signed)
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  Continue dialysis on TTS schedule with process underway for outpatient dialysis unit placement (this has been difficult due to the unique insurance coverage that the patient has, this hopefully will be resolved very soon). -She had creation of a left brachiocephalic fistula by Dr. Carlis Abbott on 3/11 and is currently getting hemodialysis via a right IJ tunneled hemodialysis catheter.  Dialysis Access -rij tdc -lue bc avf 3/11----very weak b/t today which is a new finding. Possibly related to relative hypotensive episode yesterday? Nonetheless, duplex study ordered. Next steps dependent on results  Suspected healthcare associated pneumonia: On intravenous cefepime and vancomycin and remains intermittently febrile.  She has fever of unknown origin that has been worked up with imaging/cultures.  Was started her on prednisone for suspected autoimmune mechanism associated fevers, mgmt per primary.  Anemia: Likely from underlying chronic disease and surgical losses.  Ferritin level very high. Continue Aranesp and transfuse as needed. increased dose to 178mg qtuesdays  Hypertension/Volume: LE  edema manage with dialysis/Ultrafiltration, significantly improved. BP on softer side, decreasing procardia xl to 318mdaily, stopping labetalol prns. Monitor for now, if needed can hold procardia xl on HD days.  Secondary hyperparathyroidism: Calcium and phosphorus under acceptable control, not on binders at this time.  PTH level was 193 on 2/23-at goal for ESRD. Repeat PTH ordered  Hyponatremia: stable. Managed with dialysis, continue fluid restriction.  Rash -probably stress related hives. Already on prednisone. Avoid anti-microbial wipes here.  Per primary  Subjective: Seen and examined at bedside this AM. No complaints. Felt wiped out after HD yesterday. Felt worse after taking keppra. No other complaints.   Objective Vital signs in last 24 hours: Vitals:   03/28/20 1300 03/28/20 1352 03/28/20 2002 03/29/20 0419  BP: 101/61 (!) 77/51 97/63 107/63  Pulse: (!) 101 (!) 104 (!) 106 (!) 111  Resp: 18 17 18 19   Temp:  100.2 F (37.9 C) 98 F (36.7 C) 97.9 F (36.6 C)  TempSrc:   Oral   SpO2: 99% 99% 99% 99%  Weight:      Height:       Weight change:   Intake/Output Summary (Last 24 hours) at 03/29/2020 0810 Last data filed at 03/28/2020 1244 Gross per 24 hour  Intake -  Output 3000 ml  Net -3000 ml       Labs: Basic Metabolic Panel: Recent Labs  Lab 03/24/20 0621 03/25/20 0218 03/26/20 0115 03/28/20 0851  NA 134* 133* 132* 132*  K 3.8 3.8 4.0 4.0  CL 99 97* 97* 97*  CO2 26 27 25 26   GLUCOSE 90 91 95 101*  BUN 32* 17 35* 32*  CREATININE 8.38* 5.38* 7.62* 7.60*  CALCIUM 8.0* 8.1* 8.4* 8.5*  PHOS 3.7  --   --  3.4   Liver Function Tests: Recent Labs  Lab 03/24/20 0621 03/28/20 0851  ALBUMIN 1.9* 2.3*   No results for input(s): LIPASE, AMYLASE in the last 168 hours. No results for input(s): AMMONIA in the last 168 hours. CBC: Recent Labs  Lab 03/23/20 0219 03/24/20 0621 03/25/20 0218 03/26/20 0115 03/28/20 0851  WBC 9.9 9.2 8.7 8.3 7.0  NEUTROABS 6.8  --   --  6.2  --   HGB 7.0* 6.9* 9.4* 9.3* 9.2*  HCT 22.4* 21.1* 28.7* 28.3* 29.5*  MCV 92.6  93.0 91.4 90.7 94.6  PLT 225 250 188 189 165   Cardiac Enzymes: No results for input(s): CKTOTAL, CKMB, CKMBINDEX, TROPONINI in the last 168 hours. CBG: Recent Labs  Lab 03/28/20 1350 03/28/20 1622 03/28/20 2003 03/29/20 0636 03/29/20 0753  GLUCAP 90 96 108* 94 89    Iron Studies: No results for input(s): IRON, TIBC, TRANSFERRIN, FERRITIN in the last 72 hours. Studies/Results: No results found.  Medications: Infusions: . sodium chloride  10 mL/hr at 03/14/20 1441    Scheduled Medications: . chlorhexidine gluconate (MEDLINE KIT)  15 mL Mouth Rinse BID  . [START ON 03/31/2020] darbepoetin (ARANESP) injection - DIALYSIS  100 mcg Intravenous Q Tue-HD  . dextromethorphan  30 mg Oral BID  . docusate sodium  100 mg Oral BID  . feeding supplement  1 Container Oral TID BM  . levETIRAcetam  250 mg Oral Q T,Th,Sa-HD  . levETIRAcetam  500 mg Oral Daily  . mouth rinse  15 mL Mouth Rinse BID  . metoprolol tartrate  75 mg Oral BID  . multivitamin  1 tablet Oral QHS  . NIFEdipine  90 mg Oral Daily  . predniSONE  20 mg Oral Q breakfast   Followed by  . [START ON 04/01/2020] predniSONE  10 mg Oral Q breakfast    have reviewed scheduled and prn medications.  Physical Exam: General:NAD, laying flat in bed Heart:RRR, s1s2 nl Lungs:clear b/l, no w/r/r/c, unlabored, bl chest expansion Abdomen:soft, Non-tender, non-distended Extremities: no edema bl LE's Skin: rash mainly on arms and legs (improved) Dialysis Access: Right IJ TDC, site clean. LUE avf with weak/minimal B/T (new)  Vikas Singh 03/29/2020,8:10 AM  LOS: 33 days

## 2020-03-29 NOTE — Plan of Care (Signed)
  Problem: Clinical Measurements: Goal: Will remain free from infection Outcome: Progressing   Problem: Clinical Measurements: Goal: Respiratory complications will improve Outcome: Progressing   Problem: Clinical Measurements: Goal: Cardiovascular complication will be avoided Outcome: Progressing   Problem: Safety: Goal: Ability to remain free from injury will improve Outcome: Progressing   Problem: Education: Goal: Knowledge of disease and its progression will improve Outcome: Progressing

## 2020-03-29 NOTE — Progress Notes (Signed)
Upper extremity dialysis access has been completed.   Preliminary results in CV Proc.   Abram Sander 03/29/2020 2:45 PM

## 2020-03-29 NOTE — Progress Notes (Signed)
Patient ID: Barbara Crawford, female   DOB: 1955-11-21, 65 y.o.   MRN: [id number]  PROGRESS NOTE    Barbara Crawford  PIR:[id number] DOB: 1955-06-13 DOA: 02/25/2020 PCP: Vonna Drafts, FNP   Brief Narrative:  65 yo female with past medical history of hypertension, prior Covid (Dec 2021) presented to the hospital with complaints of lower extremity swelling, back pain, generalized malaise, fatigue at home. In the ED,she was found to have a hemoglobin of 6.3, potassium 5.4, sodium 130, bicarb 9, BUN 144, and creatinine 29.98. CT of the abdomen and pelvis was negative for obstruction or acute abnormality. Temporary hemodialysis catheter was placed by IR and subsequently, the patient had a PEA arrest associated with seizure-like activity. She was intubated and subsequently extubated on 02/28/2020. 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 Renal biopsy was done 03/03/2019. ANA positive. ID followed the patient for fever. Dialysis cath was removed on 03/06/20 and subsequently underwent permacath placement.  During the hospitalization, patient continued to have fever so ID wasreconsulted.Extensive work-up was done. Recent x-ray had shown consolidation in the lungs. Patient was started on vancomycin and cefepime for possible healthcare associated pneumonia and subsequentlycompleted the course of antibiotic.The thought process was that there was possibility of autoimmune disease causing kidney disease and fever. She was treated with prednisone with improvement in fever.   Currently awaiting for outpatient hemodialysis arrangement.  She was noted to have slightly lower hemoglobin level on 03/24/20 requiring 1 unit PRBC transfusion.  No overt bleeding identified.  She is currently medically stable for discharge.  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 CT scan of the abdomen pelvis from 03/16/2020 showed evolving left perinephric hematoma without acute hemorrhagic component.  -Status post IR guided internal jugular tunneled hemodialysis catheter on 03/09/2020 -Nephrology is following and dialysis as per nephrology schedule.  Awaiting outpatient hemodialysis unit arrangement  Fever/possible healthcare associated pneumonia:  -Has completed course of vancomycin and cefepime.  Blood/urine cultures have been negative so far -Duplex ultrasound was negative for DVT. -Continue incentive spirometry -Possible autoimmune etiology of fever as well. CT scan of the abdomen pelvis with evolving left perinephric hematoma. CT scan of the chest was performed which showed mediastinal and axillary lymph nodes with decreasing bilateral pleural effusion. -Continue tapering doses of prednisone -Had a T-max of 100 over the last 24 hours.  Anemia of chronic disease -Status post 3 units of packed red cells transfusion during this hospitalization; last transfusion was on 03/24/2020 for hemoglobin of 6.9.  Hemoglobin 9.2 on 03/28/2020.  Monitor  Seizure -Patient had seizure with loss of pulse and PEA arrest status post CPR.  Patient was seen by neurology during his hospitalization. -No further seizures while inpatient.  Continue current doses of Keppra.  Outpatient follow-up with neurology  Rash -Patient developed rash on 03/25/2020 after receiving CHG bath with wipes: Mostly over the thighs and arm.  CHG bath and wipes have been discontinued.  Monitor.  Use loratadine/antiallergic cream for itching.  Hyponatremia -Being managed by nephrology by dialysis.  Essential hypertension -Blood pressure stable.  Continue nifedipine, metoprolol  Acute on chronic respiratory failure  with hypoxia -Resolved.  Extubated on 02/28/2020.  Currently on room air  Cardiac arrest with pulseless electrical activity -Status post CPR and intubation.  Stable at this  time  Debility/deconditioning -Will need home health PT on discharge  DVT prophylaxis: SCDs Code Status: Full Family Communication: None at bedside Disposition Plan: Status is: Inpatient  Remains inpatient appropriate because:Inpatient level of care appropriate due to severity of illness   Dispo:  Patient From: Home  Planned Disposition: Home with home health as soon as outpatient hemodialysis unit is arranged  Medically stable for discharge: Yes   Consultants: PCCM/nephrology/IR/ID/vascular surgery/neurology  Procedures: Intubation/extubation Renal biopsy IR guided tunneled dialysis catheter placement  Antimicrobials:  Anti-infectives (From admission, onward)   Start     Dose/Rate Route Frequency Ordered Stop   03/18/20 0900  ceFEPIme (MAXIPIME) 2 g in sodium chloride 0.9 % 100 mL IVPB        2 g 200 mL/hr over 30 Minutes Intravenous  Once 03/18/20 0751 03/18/20 0953   03/17/20 1200  vancomycin (VANCOCIN) IVPB 750 mg/150 ml premix        750 mg 150 mL/hr over 60 Minutes Intravenous Every T-Th-Sa (Hemodialysis) 03/14/20 1027 03/19/20 1158   03/14/20 1200  vancomycin (VANCOCIN) IVPB 1000 mg/200 mL premix        1,000 mg 200 mL/hr over 60 Minutes Intravenous  Once 03/14/20 1027 03/14/20 1641   03/14/20 1200  ceFEPIme (MAXIPIME) 2 g in sodium chloride 0.9 % 100 mL IVPB        2 g 200 mL/hr over 30 Minutes Intravenous Every T-Th-Sa (Hemodialysis) 03/14/20 1027 03/21/20 1159   03/14/20 0600  vancomycin (VANCOREADY) IVPB 1000 mg/200 mL        1,000 mg 200 mL/hr over 60 Minutes Intravenous To Short Stay 03/13/20 0712 03/13/20 0954   03/09/20 0600  vancomycin (VANCOREADY) IVPB 1000 mg/200 mL        1,000 mg 200 mL/hr over 60 Minutes Intravenous On call 03/06/20 1647 03/09/20 1542   03/06/20 1200   vancomycin (VANCOCIN) IVPB 750 mg/150 ml premix  Status:  Discontinued        750 mg 150 mL/hr over 60 Minutes Intravenous  Once 03/05/20 1426 03/05/20 1447   03/05/20 1800  vancomycin (VANCOCIN) IVPB 750 mg/150 ml premix  Status:  Discontinued        750 mg 150 mL/hr over 60 Minutes Intravenous  Once 03/05/20 0831 03/05/20 0834   03/05/20 0930  vancomycin (VANCOREADY) IVPB 1500 mg/300 mL  Status:  Discontinued        1,500 mg 150 mL/hr over 120 Minutes Intravenous  Once 03/05/20 0831 03/05/20 1447   03/05/20 0930  ceFEPIme (MAXIPIME) 1 g in sodium chloride 0.9 % 100 mL IVPB  Status:  Discontinued        1 g 200 mL/hr over 30 Minutes Intravenous Every 24 hours 03/05/20 0831 03/05/20 1447   02/29/20 1000  cefTRIAXone (ROCEPHIN) 2 g in sodium chloride 0.9 % 100 mL IVPB  Status:  Discontinued        2 g 200 mL/hr over 30 Minutes Intravenous Every 24 hours 02/29/20 0910 03/05/20 0756       Subjective: Patient seen and examined at bedside.  Denies worsening shortness of breath, abdominal pain, diarrhea or vomiting.   Objective: Vitals:   03/28/20 1300 03/28/20 1352 03/28/20 2002 03/29/20 0419  BP: 101/61 (!) 77/51 97/63 107/63  Pulse: (!) 101 (!) 104 (!) 106 (!) 111  Resp: _0 Temp:  100.2 F (37.9 C) 98 F (36.7 C) 97.9 F (36.6 C)  TempSrc:   Oral   SpO2: 99% 99% 99% 99%  Weight:  Height:        Intake/Output Summary (Last 24 hours) at 03/29/2020 0737 Last data filed at 03/28/2020 1244 Gross per 24 hour  Intake -  Output 3000 ml  Net -3000 ml   Filed Weights   03/26/20 1154 03/28/20 0839 03/28/20 1244  Weight: 58.8 kg 60 kg 57 kg    Examination:  General exam: Currently on room air.  No distress.   Respiratory system: Bilateral decreased breath sounds at bases with scattered crackles  cardiovascular system: Tachycardic, S1-S2 heard  gastrointestinal system: Abdomen is slightly distended, soft and nontender.  Normal bowel sounds heard  extremities: No  cyanosis; trace lower extremity edema present  Data Reviewed: I have personally reviewed following labs and imaging studies  CBC: Recent Labs  Lab 03/23/20 0219 03/24/20 0621 03/25/20 0218 03/26/20 0115 03/28/20 0851  WBC 9.9 9.2 8.7 8.3 7.0  NEUTROABS 6.8  --   --  6.2  --   HGB 7.0* 6.9* 9.4* 9.3* 9.2*  HCT 22.4* 21.1* 28.7* 28.3* 29.5*  MCV 92.6 93.0 91.4 90.7 94.6  PLT 225 250 188 189 092   Basic Metabolic Panel: Recent Labs  Lab 03/23/20 0219 03/24/20 0621 03/25/20 0218 03/26/20 0115 03/28/20 0851  NA 135 134* 133* 132* 132*  K 3.7 3.8 3.8 4.0 4.0  CL 99 99 97* 97* 97*  CO2 _0 GLUCOSE 90 90 91 95 101*  BUN 23 32* 17 35* 32*  CREATININE 5.92* 8.38* 5.38* 7.62* 7.60*  CALCIUM 8.3* 8.0* 8.1* 8.4* 8.5*  MG  --   --  1.8  --   --   PHOS  --  3.7  --   --  3.4   GFR: Estimated Creatinine Clearance: 6.3 mL/min (A) (by C-G formula based on SCr of 7.6 mg/dL (H)). Liver Function Tests: Recent Labs  Lab 03/24/20 0621 03/28/20 0851  ALBUMIN 1.9* 2.3*   No results for input(s): LIPASE, AMYLASE in the last 168 hours. No results for input(s): AMMONIA in the last 168 hours. Coagulation Profile: No results for input(s): INR, PROTIME in the last 168 hours. Cardiac Enzymes: No results for input(s): CKTOTAL, CKMB, CKMBINDEX, TROPONINI in the last 168 hours. BNP (last 3 results) No results for input(s): PROBNP in the last 8760 hours. HbA1C: No results for input(s): HGBA1C in the last 72 hours. CBG: Recent Labs  Lab 03/28/20 0746 03/28/20 1350 03/28/20 1622 03/28/20 2003 03/29/20 0636  GLUCAP 118* 90 96 108* 94   Lipid Profile: No results for input(s): CHOL, HDL, LDLCALC, TRIG, CHOLHDL, LDLDIRECT in the last 72 hours. Thyroid Function Tests: No results for input(s): TSH, T4TOTAL, FREET4, T3FREE, THYROIDAB in the last 72 hours. Anemia Panel: No results for input(s): VITAMINB12, FOLATE, FERRITIN, TIBC, IRON, RETICCTPCT in the last 72 hours. Sepsis  Labs: No results for input(s): PROCALCITON, LATICACIDVEN in the last 168 hours.  No results found for this or any previous visit (from the past 240 hour(s)).       Radiology Studies: No results found.      Scheduled Meds: . chlorhexidine gluconate (MEDLINE KIT)  15 mL Mouth Rinse BID  . [START ON 03/31/2020] darbepoetin (ARANESP) injection - DIALYSIS  100 mcg Intravenous Q Tue-HD  . dextromethorphan  30 mg Oral BID  . docusate sodium  100 mg Oral BID  . feeding supplement  1 Container Oral TID BM  . levETIRAcetam  250 mg Oral Q T,Th,Sa-HD  . levETIRAcetam  500 mg Oral Daily  . mouth  rinse  15 mL Mouth Rinse BID  . metoprolol tartrate  75 mg Oral BID  . multivitamin  1 tablet Oral QHS  . NIFEdipine  90 mg Oral Daily  . predniSONE  20 mg Oral Q breakfast   Followed by  . [START ON 04/01/2020] predniSONE  10 mg Oral Q breakfast   Continuous Infusions: . sodium chloride 10 mL/hr at 03/14/20 1441          Kshitiz Alekh, MD [facility] 03/29/2020, 7:37 AM

## 2020-03-30 DIAGNOSIS — N179 Acute kidney failure, unspecified: Secondary | ICD-10-CM | POA: Diagnosis not present

## 2020-03-30 DIAGNOSIS — N051 Unspecified nephritic syndrome with focal and segmental glomerular lesions: Secondary | ICD-10-CM | POA: Diagnosis not present

## 2020-03-30 DIAGNOSIS — R509 Fever, unspecified: Secondary | ICD-10-CM | POA: Diagnosis not present

## 2020-03-30 DIAGNOSIS — I469 Cardiac arrest, cause unspecified: Secondary | ICD-10-CM | POA: Diagnosis not present

## 2020-03-30 LAB — GLUCOSE, CAPILLARY
Glucose-Capillary: 89 mg/dL (ref 70–99)
Glucose-Capillary: 121 mg/dL — ABNORMAL HIGH (ref 70–99)
Glucose-Capillary: 120 mg/dL — ABNORMAL HIGH (ref 70–99)
Glucose-Capillary: 129 mg/dL — ABNORMAL HIGH (ref 70–99)

## 2020-03-30 NOTE — Progress Notes (Signed)
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  Continue dialysis on TTS schedule with process underway for outpatient dialysis unit placement (this has been difficult due to the unique insurance coverage that the patient has, this hopefully will be resolved very soon). -She had creation of a left brachiocephalic fistula by Dr. Carlis Abbott on 3/11 and is currently getting hemodialysis via a right IJ tunneled hemodialysis catheter.  Dialysis Access -rij tdc -lue bc avf 3/11 clotted, VVS aware; appreciate assistance.  Suspected healthcare associated pneumonia: s/p cefepime and vancomycin and remains intermittently febrile.  She has fever of unknown origin that has been worked up with imaging/cultures.  Was started her on prednisone taper for suspected autoimmune mechanism associated fevers, mgmt per primary.  Anemia: Likely from underlying chronic disease and surgical losses.  Ferritin level very high. Continue Aranesp and transfuse as needed. increased dose to 140mg qtuesdays  Hypertension/Volume: LE  edema manage with dialysis/Ultrafiltration, significantly improved. BP on softer side, decreased procardia xl to 386mdaily, stopping labetalol prns. Monitor for now, hold procardia xl on HD days.  Secondary hyperparathyroidism: Calcium and phosphorus under acceptable control, not on binders at this time.  PTH level was 193 on 2/23-at goal for ESRD. Repeat PTH pending  Hyponatremia: stable and mild. Managed with dialysis, continue fluid restriction.  Rash -probably stress related hives. Already on prednisone. Avoid anti-microbial wipes here. Per primary  Subjective: Seen and examined at bedside this AM. No complaints. Disappointed by clotted AVF.     Objective Vital signs in  last 24 hours: Vitals:   03/28/20 2002 03/29/20 0419 03/29/20 1405 03/29/20 2141  BP: 97/63 107/63 98/72 92/63   Pulse: (!) 106 (!) 101 94 88  Resp: 18 19 17 16   Temp: 98 F (36.7 C) 97.9 F (36.6 C) 100.1 F (37.8 C) 98.5 F (36.9 C)  TempSrc: Oral   Oral  SpO2: 99% 99% 98% 96%  Weight:      Height:       Weight change:  No intake or output data in the 24 hours ending 03/30/20 0653     Labs: Basic Metabolic Panel: Recent Labs  Lab 03/24/20 0621 03/25/20 0218 03/26/20 0115 03/28/20 0851  NA 134* 133* 132* 132*  K 3.8 3.8 4.0 4.0  CL 99 97* 97* 97*  CO2 26 27 25 26   GLUCOSE 90 91 95 101*  BUN 32* 17 35* 32*  CREATININE 8.38* 5.38* 7.62* 7.60*  CALCIUM 8.0* 8.1* 8.4* 8.5*  PHOS 3.7  --   --  3.4   Liver Function Tests: Recent Labs  Lab 03/24/20 0621 03/28/20 0851  ALBUMIN 1.9* 2.3*   No results for input(s): LIPASE, AMYLASE in the last 168 hours. No results for input(s): AMMONIA in the last 168 hours. CBC: Recent Labs  Lab 03/24/20 0621 03/25/20 0218 03/26/20 0115 03/28/20 0851  WBC 9.2 8.7 8.3 7.0  NEUTROABS  --   --  6.2  --   HGB 6.9* 9.4* 9.3* 9.2*  HCT 21.1* 28.7* 28.3* 29.5*  MCV 93.0 91.4 90.7 94.6  PLT 250 188 189 165   Cardiac Enzymes: No results for input(s): CKTOTAL, CKMB, CKMBINDEX, TROPONINI in the last 168 hours. CBG: Recent Labs  Lab 03/29/20 0636 03/29/20 0753 03/29/20 1223 03/29/20 1641 03/29/20 2139  GLUCAP 94 89 87 122* 109*    Iron Studies: No  results for input(s): IRON, TIBC, TRANSFERRIN, FERRITIN in the last 72 hours. Studies/Results: VAS US DUPLEX DIALYSIS ACCESS (AVF, AVG)  Result Date: 03/29/2020 DIALYSIS ACCESS Reason for Exam: No palpable thrill for AVF/AVG. Access Site: Left Upper Extremity. Access Type: Brachial-cephalic AVF. History: Fistula sx done on 03/13/20. Performing Technologist: Abram Sander RVS  Examination Guidelines: A complete evaluation includes B-mode imaging, spectral Doppler, color Doppler, and  power Doppler as needed of all accessible portions of each vessel. Unilateral testing is considered an integral part of a complete examination. Limited examinations for reoccurring indications may be performed as noted.  Findings: +--------------------+----------+-----------------+--------+ AVF                 PSV (cm/s)Flow Vol (mL/min)Comments +--------------------+----------+-----------------+--------+ Native artery inflow    62                              +--------------------+----------+-----------------+--------+   +--------------------+----------+-----------------+--------+ AVG                 PSV (cm/s)Flow Vol (mL/min)Describe +--------------------+----------+-----------------+--------+ Native artery inflow    62                              +--------------------+----------+-----------------+--------+ Arterial anastomosis                           occluded +--------------------+----------+-----------------+--------+ Prox graft                                     occluded +--------------------+----------+-----------------+--------+ Mid graft                                      occluded +--------------------+----------+-----------------+--------+ Distal graft                                   occluded +--------------------+----------+-----------------+--------+ Venous anastomosis                             occluded +--------------------+----------+-----------------+--------+  Summary: 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  *See table(s) above for measurements and observations.   --------------------------------------------------------------------------------   Preliminary     Medications: Infusions: . sodium chloride 10 mL/hr at 03/14/20 1441    Scheduled Medications: . chlorhexidine gluconate (MEDLINE KIT)  15 mL Mouth Rinse BID  . [START ON 03/31/2020] darbepoetin (ARANESP) injection - DIALYSIS  100 mcg Intravenous Q Tue-HD  . dextromethorphan  30 mg Oral BID   . docusate sodium  100 mg Oral BID  . feeding supplement  1 Container Oral TID BM  . levETIRAcetam  250 mg Oral Q T,Th,Sa-HD  . levETIRAcetam  500 mg Oral Daily  . mouth rinse  15 mL Mouth Rinse BID  . metoprolol tartrate  75 mg Oral BID  . multivitamin  1 tablet Oral QHS  . NIFEdipine  30 mg Oral Daily  . predniSONE  20 mg Oral Q breakfast   Followed by  . [START ON 04/01/2020] predniSONE  10 mg Oral Q breakfast    have reviewed scheduled and prn medications.  Physical Exam: General:NAD, laying flat in bed Heart:RRR, s1s2 nl Lungs:clear b/l,  no w/r/r/c, unlabored, bl chest expansion Abdomen:soft, Non-tender, non-distended Extremities: no edema bl LE's Skin: rash mainly on arms and legs (improved) Dialysis Access: Right IJ TDC, site clean. LUE avf no thrill or bruit   Justin Mend 03/30/2020,6:53 AM  LOS: 34 days

## 2020-03-30 NOTE — Progress Notes (Signed)
Occupational Therapy Treatment Patient Details Name: Barbara Crawford MRN: [id number] DOB: [birth year] Today's Date: 03/30/2020    History of present illness 65 y.o. female with medical history significant of HTN. Pt had COVID in Dec, 1 week prior to presenting to ED 02/25/20 she developed swelling in BLE. Developed pain in B lower back; CT abd/pelvis - no obstruction or acute abnormality. HGB 6.3, +acute renal failure; 2/23 seizure and Code Blue with CPR x 4 minutes and intubated 2/23-2/25; CRRT; renal biopsy 2/28 with perinephric hematoma; pneumonia;  IJ catheter placed and HD initiated. Plans to have dialysis access placed 3/11   OT comments  Pt making steady progress towards OT goals this session. Session focus on standing UB/LB bathing and household distance functional mobility with RW. Pt requires supervision for household distance functional mobility with RW and supervision for standing UB/LB bathing in standing. Pt able to tolerate standing ~ 8 mins with no UE support or LOB, HR max 117 bpm. Pt would continue to benefit from skilled occupational therapy while admitted and after d/c to address the below listed limitations in order to improve overall functional mobility and facilitate independence with BADL participation. DC plan remains appropriate, will follow acutely per POC.     Follow Up Recommendations  Home health OT    Equipment Recommendations  3 in 1 bedside commode    Recommendations for Other Services      Precautions / Restrictions Precautions Precautions: Fall Restrictions Weight Bearing Restrictions: No       Mobility Bed Mobility Overal bed mobility: Needs Assistance Bed Mobility: Supine to Sit     Supine to sit: Modified independent (Device/Increase time)     General bed mobility comments: no physical assist needed, use of bed rail and elevated HOB    Transfers Overall transfer level: Needs assistance Equipment used: Rolling walker (2  wheeled) Transfers: Sit to/from Stand Sit to Stand: Supervision         General transfer comment: supervision for safety    Balance Overall balance assessment: Modified Independent Sitting-balance support: Feet supported Sitting balance-Leahy Scale: Good Sitting balance - Comments: sitting EOB to don crocs with no LOB   Standing balance support: No upper extremity supported;During functional activity Standing balance-Leahy Scale: Good Standing balance comment: completing standing dynamic grooming tasks at sink with no UE support or LOB, pt standing ~ 8 mins                           ADL either performed or assessed with clinical judgement   ADL Overall ADL's : Needs assistance/impaired     Grooming: Wash/dry face;Oral care;Standing;Supervision/safety Grooming Details (indicate cue type and reason): standing at sink with no UE support with supervision Upper Body Bathing: Supervision/ safety;Standing   Lower Body Bathing: Supervison/ safety;Sit to/from stand   Upper Body Dressing : Set up;Sitting       Toilet Transfer: Supervision/safety;Ambulation;RW Toilet Transfer Details (indicate cue type and reason): simulated via functional mobility from EOB<>sink with RW         Functional mobility during ADLs: Supervision/safety;Rolling walker General ADL Comments: pt completed standing bathing at sink ~ 8 mins with no UE support or LOB, supervision provided for safety     Vision       Perception     Praxis      Cognition Arousal/Alertness: Awake/alert Behavior During Therapy: WFL for tasks assessed/performed Overall Cognitive Status: Within Functional Limits for tasks assessed  General Comments: initially upset about potential of having to get fistula replaced, offered emotional support and activie listening        Exercises     Shoulder Instructions       General Comments HR max 117 bpm with mobility     Pertinent Vitals/ Pain       Pain Assessment: No/denies pain  Home Living                                          Prior Functioning/Environment              Frequency  Min 2X/week        Progress Toward Goals  OT Goals(current goals can now be found in the care plan section)  Progress towards OT goals: Progressing toward goals  Acute Rehab OT Goals Patient Stated Goal: To return home. OT Goal Formulation: With patient Time For Goal Achievement: 04/03/20 Potential to Achieve Goals: Good  Plan Discharge plan remains appropriate;Frequency remains appropriate    Co-evaluation                 AM-PAC OT "6 Clicks" Daily Activity     Outcome Measure   Help from another person eating meals?: None Help from another person taking care of personal grooming?: None Help from another person toileting, which includes using toliet, bedpan, or urinal?: A Little Help from another person bathing (including washing, rinsing, drying)?: A Little Help from another person to put on and taking off regular upper body clothing?: None Help from another person to put on and taking off regular lower body clothing?: None 6 Click Score: 22    End of Session Equipment Utilized During Treatment: Rolling walker  OT Visit Diagnosis: Unsteadiness on feet (R26.81);Muscle weakness (generalized) (M62.81)   Activity Tolerance Patient tolerated treatment well   Patient Left in chair;with call bell/phone within reach   Nurse Communication Mobility status        Time: [id number] OT Time Calculation (min): 22 min  Charges: OT General Charges $OT Visit: 1 Visit OT Treatments $Self Care/Home Management : 8-22 mins  [name], COTA/L Acute Rehabilitation Services [place] 03/30/2020, 10:00 AM

## 2020-03-30 NOTE — Progress Notes (Addendum)
  Progress Note    03/30/2020 7:52 AM 17 Days Post-Op  Subjective:  Patient is s/p L brachiocephalic fistula creation on 03/13/20.  Fistula duplex yesterday shows fistula has thrombosed.  Patient is frustrated with this news and her lengthy hospital stay.  R IJ [place] working well per patient.   Vitals:   03/29/20 1405 03/29/20 2141  BP: 98/72 92/63  Pulse: 94 88  Resp: 17 16  Temp: 100.1 F (37.8 C) 98.5 F (36.9 C)  SpO2: 98% 96%   Physical Exam: Lungs:  Non labored Incisions:  L arm incision c/d/i Extremities:  Palpable radial pulses Neurologic: A&O  CBC    Component Value Date/Time   WBC 7.0 03/28/2020 0851   RBC 3.12 (L) 03/28/2020 0851   HGB 9.2 (L) 03/28/2020 0851   HCT 29.5 (L) 03/28/2020 0851   PLT 165 03/28/2020 0851   MCV 94.6 03/28/2020 0851   MCH 29.5 03/28/2020 0851   MCHC 31.2 03/28/2020 0851   RDW 16.8 (H) 03/28/2020 0851   LYMPHSABS 0.6 (L) 03/26/2020 0115   MONOABS 1.1 (H) 03/26/2020 0115   EOSABS 0.1 03/26/2020 0115   BASOSABS 0.1 03/26/2020 0115    BMET    Component Value Date/Time   NA 132 (L) 03/28/2020 0851   K 4.0 03/28/2020 0851   CL 97 (L) 03/28/2020 0851   CO2 26 03/28/2020 0851   GLUCOSE 101 (H) 03/28/2020 0851   BUN 32 (H) 03/28/2020 0851   CREATININE 7.60 (H) 03/28/2020 0851   CALCIUM 8.5 (L) 03/28/2020 0851   CALCIUM 6.2 (LL) 02/26/2020 0305   GFRNONAA 6 (L) 03/28/2020 0851   GFRAA >60 01/28/2016 1543    INR    Component Value Date/Time   INR 1.2 03/05/2020 1457    No intake or output data in the 24 hours ending 03/30/20 0752   Assessment/Plan:  65 y.o. female is s/p thrombosed L brachiocephalic fistula 17 Days Post-Op   L brachiocephalic fistula occluded based on duplex; it is unclear why this may have occurred Vein mapping shows an inadequate L basilic vein  Dagoberto Ligas, PA-C [facility] [phone] 03/30/2020 7:52 AM  VASCULAR STAFF ADDENDUM: I have independently interviewed and  examined the patient. I agree with the above.  Tolerating HD via RIJ TDC well.  She is anxious to go home. On exam and duplex, thrombosed LUE BC AVF. Will plan for outpatient arteriovenous graft with me or Dr. Carlis Abbott in the near future. Follow up will be arranged with our office. OK for discharge. Please call for questions.  Yevonne Aline. Stanford Breed, MD [facility] Phone Number: [phone] 03/30/2020 10:36 AM

## 2020-03-30 NOTE — Plan of Care (Signed)
  Problem: Activity: Goal: Ability to tolerate increased activity will improve Outcome: Progressing   Problem: Respiratory: Goal: Ability to maintain a clear airway and adequate ventilation will improve Outcome: Progressing   Problem: Respiratory: Goal: Respiratory symptoms related to disease process will be avoided Outcome: Progressing

## 2020-03-30 NOTE — Plan of Care (Signed)
  Problem: Clinical Measurements: Goal: Will remain free from infection Outcome: Progressing   Problem: Clinical Measurements: Goal: Ability to maintain clinical measurements within normal limits will improve Outcome: Progressing Goal: Will remain free from infection Outcome: Progressing Goal: Diagnostic test results will improve Outcome: Progressing Goal: Respiratory complications will improve Outcome: Progressing Goal: Cardiovascular complication will be avoided Outcome: Progressing   Problem: Activity: Goal: Risk for activity intolerance will decrease Outcome: Progressing   Problem: Nutrition: Goal: Adequate nutrition will be maintained Outcome: Progressing   Problem: Coping: Goal: Level of anxiety will decrease Outcome: Progressing   Problem: Elimination: Goal: Will not experience complications related to bowel motility Outcome: Progressing Goal: Will not experience complications related to urinary retention Outcome: Progressing   Problem: Pain Managment: Goal: General experience of comfort will improve Outcome: Progressing   Problem: Safety: Goal: Ability to remain free from injury will improve Outcome: Progressing   Problem: Skin Integrity: Goal: Risk for impaired skin integrity will decrease Outcome: Progressing   Problem: Education: Goal: Knowledge of disease and its progression will improve Outcome: Progressing   Problem: Health Behavior/Discharge Planning: Goal: Ability to manage health-related needs will improve Outcome: Progressing   Problem: Clinical Measurements: Goal: Complications related to the disease process or treatment will be avoided or minimized Outcome: Progressing Goal: Dialysis access will remain free of complications Outcome: Progressing   Problem: Activity: Goal: Activity intolerance will improve Outcome: Progressing   Problem: Fluid Volume: Goal: Fluid volume balance will be maintained or improved Outcome: Progressing    Problem: Nutritional: Goal: Ability to make appropriate dietary choices will improve Outcome: Progressing   Problem: Respiratory: Goal: Respiratory symptoms related to disease process will be avoided Outcome: Progressing   Problem: Self-Concept: Goal: Body image disturbance will be avoided or minimized Outcome: Progressing   Problem: Urinary Elimination: Goal: Progression of disease will be identified and treated Outcome: Progressing   Problem: Activity: Goal: Ability to tolerate increased activity will improve Outcome: Progressing   Problem: Respiratory: Goal: Ability to maintain a clear airway and adequate ventilation will improve Outcome: Progressing   Problem: Role Relationship: Goal: Method of communication will improve Outcome: Progressing   Problem: Education: Goal: Knowledge of disease and its progression will improve Outcome: Progressing   Problem: Health Behavior/Discharge Planning: Goal: Ability to manage health-related needs will improve Outcome: Progressing   Problem: Clinical Measurements: Goal: Complications related to the disease process or treatment will be avoided or minimized Outcome: Progressing Goal: Dialysis access will remain free of complications Outcome: Progressing   Problem: Activity: Goal: Activity intolerance will improve Outcome: Progressing   Problem: Fluid Volume: Goal: Fluid volume balance will be maintained or improved Outcome: Progressing   Problem: Nutritional: Goal: Ability to make appropriate dietary choices will improve Outcome: Progressing   Problem: Respiratory: Goal: Respiratory symptoms related to disease process will be avoided Outcome: Progressing   Problem: Self-Concept: Goal: Body image disturbance will be avoided or minimized Outcome: Progressing   Problem: Urinary Elimination: Goal: Progression of disease will be identified and treated Outcome: Progressing

## 2020-03-30 NOTE — Plan of Care (Signed)
Problem: Clinical Measurements: Goal: Will remain free from infection 03/30/2020 1807 by Morene Rankins, LPN Outcome: Progressing 03/30/2020 1807 by Morene Rankins, LPN Outcome: Progressing   Problem: Clinical Measurements: Goal: Ability to maintain clinical measurements within normal limits will improve 03/30/2020 1807 by Morene Rankins, LPN Outcome: Progressing 03/30/2020 1807 by Morene Rankins, LPN Outcome: Progressing Goal: Will remain free from infection 03/30/2020 1807 by Morene Rankins, LPN Outcome: Progressing 03/30/2020 1807 by Morene Rankins, LPN Outcome: Progressing Goal: Diagnostic test results will improve 03/30/2020 1807 by Morene Rankins, LPN Outcome: Progressing 03/30/2020 1807 by Morene Rankins, LPN Outcome: Progressing Goal: Respiratory complications will improve 03/30/2020 1807 by Morene Rankins, LPN Outcome: Progressing 03/30/2020 1807 by Morene Rankins, LPN Outcome: Progressing Goal: Cardiovascular complication will be avoided 03/30/2020 1807 by Morene Rankins, LPN Outcome: Progressing 03/30/2020 1807 by Morene Rankins, LPN Outcome: Progressing   Problem: Activity: Goal: Risk for activity intolerance will decrease 03/30/2020 1807 by Morene Rankins, LPN Outcome: Progressing 03/30/2020 1807 by Morene Rankins, LPN Outcome: Progressing   Problem: Nutrition: Goal: Adequate nutrition will be maintained 03/30/2020 1807 by Morene Rankins, LPN Outcome: Progressing 03/30/2020 1807 by Morene Rankins, LPN Outcome: Progressing   Problem: Coping: Goal: Level of anxiety will decrease 03/30/2020 1807 by Morene Rankins, LPN Outcome: Progressing 03/30/2020 1807 by Morene Rankins, LPN Outcome: Progressing   Problem: Elimination: Goal: Will not experience complications related to bowel motility 03/30/2020 1807 by Morene Rankins, LPN Outcome: Progressing 03/30/2020 1807 by Morene Rankins, LPN Outcome: Progressing Goal: Will not  experience complications related to urinary retention 03/30/2020 1807 by Morene Rankins, LPN Outcome: Progressing 03/30/2020 1807 by Morene Rankins, LPN Outcome: Progressing   Problem: Pain Managment: Goal: General experience of comfort will improve 03/30/2020 1807 by Morene Rankins, LPN Outcome: Progressing 03/30/2020 1807 by Morene Rankins, LPN Outcome: Progressing   Problem: Safety: Goal: Ability to remain free from injury will improve 03/30/2020 1807 by Morene Rankins, LPN Outcome: Progressing 03/30/2020 1807 by Morene Rankins, LPN Outcome: Progressing   Problem: Skin Integrity: Goal: Risk for impaired skin integrity will decrease 03/30/2020 1807 by Morene Rankins, LPN Outcome: Progressing 03/30/2020 1807 by Morene Rankins, LPN Outcome: Progressing   Problem: Education: Goal: Knowledge of disease and its progression will improve 03/30/2020 1807 by Morene Rankins, LPN Outcome: Progressing 03/30/2020 1807 by Morene Rankins, LPN Outcome: Progressing   Problem: Health Behavior/Discharge Planning: Goal: Ability to manage health-related needs will improve 03/30/2020 1807 by Morene Rankins, LPN Outcome: Progressing 03/30/2020 1807 by Morene Rankins, LPN Outcome: Progressing   Problem: Clinical Measurements: Goal: Complications related to the disease process or treatment will be avoided or minimized 03/30/2020 1807 by Morene Rankins, LPN Outcome: Progressing 03/30/2020 1807 by Morene Rankins, LPN Outcome: Progressing Goal: Dialysis access will remain free of complications [phone] by Morene Rankins, LPN Outcome: Progressing 03/30/2020 1807 by Morene Rankins, LPN Outcome: Progressing   Problem: Activity: Goal: Activity intolerance will improve 03/30/2020 1807 by Morene Rankins, LPN Outcome: Progressing 03/30/2020 1807 by Morene Rankins, LPN Outcome: Progressing   Problem: Fluid Volume: Goal: Fluid volume balance will be maintained or  improved 03/30/2020 1807 by Morene Rankins, LPN Outcome: Progressing 03/30/2020 1807 by Morene Rankins, LPN Outcome: Progressing   Problem: Nutritional: Goal: Ability to make appropriate dietary choices will improve 03/30/2020 1807 by Morene Rankins, LPN Outcome: Progressing  03/30/2020 1807 by Morene Rankins, LPN Outcome: Progressing   Problem: Respiratory: Goal: Respiratory symptoms related to disease process will be avoided 03/30/2020 1807 by Morene Rankins, LPN Outcome: Progressing 03/30/2020 1807 by Morene Rankins, LPN Outcome: Progressing   Problem: Self-Concept: Goal: Body image disturbance will be avoided or minimized 03/30/2020 1807 by Morene Rankins, LPN Outcome: Progressing 03/30/2020 1807 by Morene Rankins, LPN Outcome: Progressing   Problem: Urinary Elimination: Goal: Progression of disease will be identified and treated 03/30/2020 1807 by Morene Rankins, LPN Outcome: Progressing 03/30/2020 1807 by Morene Rankins, LPN Outcome: Progressing   Problem: Activity: Goal: Ability to tolerate increased activity will improve 03/30/2020 1807 by Morene Rankins, LPN Outcome: Progressing 03/30/2020 1807 by Morene Rankins, LPN Outcome: Progressing   Problem: Respiratory: Goal: Ability to maintain a clear airway and adequate ventilation will improve 03/30/2020 1807 by Morene Rankins, LPN Outcome: Progressing 03/30/2020 1807 by Morene Rankins, LPN Outcome: Progressing   Problem: Role Relationship: Goal: Method of communication will improve 03/30/2020 1807 by Morene Rankins, LPN Outcome: Progressing 03/30/2020 1807 by Morene Rankins, LPN Outcome: Progressing   Problem: Education: Goal: Knowledge of disease and its progression will improve 03/30/2020 1807 by Morene Rankins, LPN Outcome: Progressing 03/30/2020 1807 by Morene Rankins, LPN Outcome: Progressing   Problem: Health Behavior/Discharge Planning: Goal: Ability to manage health-related  needs will improve 03/30/2020 1807 by Morene Rankins, LPN Outcome: Progressing 03/30/2020 1807 by Morene Rankins, LPN Outcome: Progressing   Problem: Clinical Measurements: Goal: Complications related to the disease process or treatment will be avoided or minimized 03/30/2020 1807 by Morene Rankins, LPN Outcome: Progressing 03/30/2020 1807 by Morene Rankins, LPN Outcome: Progressing Goal: Dialysis access will remain free of complications [phone] by Morene Rankins, LPN Outcome: Progressing 03/30/2020 1807 by Morene Rankins, LPN Outcome: Progressing   Problem: Activity: Goal: Activity intolerance will improve 03/30/2020 1807 by Morene Rankins, LPN Outcome: Progressing 03/30/2020 1807 by Morene Rankins, LPN Outcome: Progressing   Problem: Fluid Volume: Goal: Fluid volume balance will be maintained or improved 03/30/2020 1807 by Morene Rankins, LPN Outcome: Progressing 03/30/2020 1807 by Morene Rankins, LPN Outcome: Progressing   Problem: Nutritional: Goal: Ability to make appropriate dietary choices will improve 03/30/2020 1807 by Morene Rankins, LPN Outcome: Progressing 03/30/2020 1807 by Morene Rankins, LPN Outcome: Progressing   Problem: Respiratory: Goal: Respiratory symptoms related to disease process will be avoided 03/30/2020 1807 by Morene Rankins, LPN Outcome: Progressing 03/30/2020 1807 by Morene Rankins, LPN Outcome: Progressing   Problem: Self-Concept: Goal: Body image disturbance will be avoided or minimized 03/30/2020 1807 by Morene Rankins, LPN Outcome: Progressing 03/30/2020 1807 by Morene Rankins, LPN Outcome: Progressing   Problem: Urinary Elimination: Goal: Progression of disease will be identified and treated 03/30/2020 1807 by Morene Rankins, LPN Outcome: Progressing 03/30/2020 1807 by Morene Rankins, LPN Outcome: Progressing

## 2020-03-30 NOTE — Progress Notes (Signed)
Physical Therapy Treatment Patient Details Name: Barbara Crawford MRN: [id number] DOB: [birth year] Today's Date: 03/30/2020    History of Present Illness 65 y.o. female who reported to ED with complaints of B LE swelling and LBP. CT abd/pelvis - no obstruction or acute abnormality. HGB 6.3. Pt admitted on 02/25/20 and diagnosed with acute renal faiulre. 2/23 pt had seizure and Code Blue with CPR x 4 minutes and intubated 2/23-2/25; CRRT; renal biopsy 2/28 with perinephric hematoma; pneumonia;  IJ catheter placed and HD initiated. Dialysis access placed 3/11. PMH includes HTN and COVID, which pt had 1 week prior to presenting to ED.    PT Comments    Pt received in chair, fatigued and sleepy. Politely declined ambulation but willing to complete LE exercises to address strength. Pt demonstrating good balance and not needing to use RW or external assistance for standing exercises. Supervision for safety. Cueing to complete exercises in slow, controlled manner for better motor control. Pt becoming upset during exercises about length of stay in hospital and fistula, which was recently addressed by medical team. PT offered support and listening, educated pt on deep breathing to reduce anxiety. Pt demonstrated understanding, left in chair with all needs met, and call bell within reach. Will continue to follow acutely.    Follow Up Recommendations  Home health PT;Supervision - Intermittent     Equipment Recommendations  Rolling walker with 5" wheels    Recommendations for Other Services       Precautions / Restrictions Precautions Precautions: Fall Restrictions Weight Bearing Restrictions: No    Mobility  Bed Mobility Overal bed mobility: Needs Assistance Bed Mobility: Supine to Sit     Supine to sit: Modified independent (Device/Increase time)     General bed mobility comments: Pt received in chair    Transfers Overall transfer level: Needs assistance Equipment used: Rolling  walker (2 wheeled) Transfers: Sit to/from Stand Sit to Stand: Supervision         General transfer comment: supervision for safety  Ambulation/Gait             General Gait Details: declined due to fatigue   Stairs             Wheelchair Mobility    Modified Rankin (Stroke Patients Only)       Balance Overall balance assessment: Modified Independent Sitting-balance support: Feet supported Sitting balance-Leahy Scale: Good Sitting balance - Comments: sitting EOB to don crocs with no LOB   Standing balance support: No upper extremity supported;During functional activity Standing balance-Leahy Scale: Good Standing balance comment: Able to complete standing exercises without using RW for balance                            Cognition Arousal/Alertness: Awake/alert Behavior During Therapy: WFL for tasks assessed/performed Overall Cognitive Status: Within Functional Limits for tasks assessed                                 General Comments: Became upset during session about length of hospital stay, missing her family, and having to get fistula addressed. Offered emotional support and activie listening      Exercises General Exercises - Lower Extremity Hip ABduction/ADduction: Strengthening;Both;10 reps;Standing Hip Flexion/Marching: Strengthening;Both;20 reps;Standing Other Exercises Other Exercises: Standing hip extension, x10 each leg (RW place in front of pt, but pt did not need to use it for balance)  General Comments General comments (skin integrity, edema, etc.): HR max 117 bpm with mobility      Pertinent Vitals/Pain Pain Assessment: No/denies pain Pain Intervention(s): Monitored during session    Home Living                      Prior Function            PT Goals (current goals can now be found in the care plan section) Acute Rehab PT Goals Patient Stated Goal: To return home.    Frequency    Min  3X/week      PT Plan Current plan remains appropriate    Co-evaluation              AM-PAC PT "6 Clicks" Mobility   Outcome Measure  Help needed turning from your back to your side while in a flat bed without using bedrails?: None Help needed moving from lying on your back to sitting on the side of a flat bed without using bedrails?: A Little Help needed moving to and from a bed to a chair (including a wheelchair)?: A Little Help needed standing up from a chair using your arms (e.g., wheelchair or bedside chair)?: A Little Help needed to walk in hospital room?: A Little Help needed climbing 3-5 steps with a railing? : A Little 6 Click Score: 19    End of Session Equipment Utilized During Treatment: Gait belt Activity Tolerance: Patient limited by fatigue Patient left: with call bell/phone within reach;in chair (sitting EOB) Nurse Communication: Mobility status PT Visit Diagnosis: Muscle weakness (generalized) (M62.81)     Time: 9892-1194 PT Time Calculation (min) (ACUTE ONLY): 16 min  Charges:  $Therapeutic Exercise: 8-22 mins                     Rosita Kea, SPT

## 2020-03-30 NOTE — Progress Notes (Signed)
Patient called Renal Navigator to inform that she has received her new [employer] "[employer]" insurance card in the mail and her daughter has sent her a picture of the card. Navigator met with patient at bedside and took the policy number down to give to [employer]. Navigator asked patient to have her daughter bring the card in to the hospital so that Navigator can make a copy of it to fax to Admissions Coordinator as soon as possible. The [employer] plan is effective as of 04/03/20 per card.  Navigator informed [facility] and asked that referral be re-instated. Navigator will follow up tomorrow.   Alphonzo Cruise, [place] [facility] [phone]

## 2020-03-30 NOTE — Progress Notes (Signed)
Patient ID: Barbara Crawford, female   DOB: 1955-05-16, 65 y.o.   MRN: [id number]  PROGRESS NOTE    Melenda Bielak  KJZ:[id number] DOB: 11/04/1955 DOA: 02/25/2020 PCP: Vonna Drafts, FNP   Brief Narrative:  65 yo female with past medical history of hypertension, prior Covid (Dec 2021) presented to the hospital with complaints of lower extremity swelling, back pain, generalized malaise, fatigue at home. In the ED,she was found to have a hemoglobin of 6.3, potassium 5.4, sodium 130, bicarb 9, BUN 144, and creatinine 29.98. CT of the abdomen and pelvis was negative for obstruction or acute abnormality. Temporary hemodialysis catheter was placed by IR and subsequently, the patient had a PEA arrest associated with seizure-like activity. She was intubated and subsequently extubated on 02/28/2020. 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 Renal biopsy was done 03/03/2019. ANA positive. ID followed the patient for fever. Dialysis cath was removed on 03/06/20 and subsequently underwent permacath placement.  During the hospitalization, patient continued to have fever so ID wasreconsulted.Extensive work-up was done. Recent x-ray had shown consolidation in the lungs. Patient was started on vancomycin and cefepime for possible healthcare associated pneumonia and subsequentlycompleted the course of antibiotic.The thought process was that there was possibility of autoimmune disease causing kidney disease and fever. She was treated with prednisone with improvement in fever.   Currently awaiting for outpatient hemodialysis arrangement.  She was noted to have slightly lower hemoglobin level on 03/24/20 requiring 1 unit PRBC transfusion.  No overt bleeding identified.  She is currently medically stable for discharge.  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 CT scan of the abdomen pelvis from 03/16/2020 showed evolving left perinephric hematoma without acute hemorrhagic component.  -Status post IR guided internal jugular tunneled hemodialysis catheter on 03/09/2020 and left brachiocephalic fistula creation on 03/13/2020. -Nephrology is following and dialysis as per nephrology schedule.  Awaiting outpatient hemodialysis unit arrangement -Fistula duplex on 03/29/2020 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  Vascular surgery has been reconsulted.  Fever/possible healthcare associated pneumonia:  -Has completed course of vancomycin and cefepime.  Blood/urine cultures have been negative so far -Duplex ultrasound was negative for DVT. -Continue incentive spirometry -Possible autoimmune etiology of fever as well. CT scan of the abdomen pelvis with evolving left perinephric hematoma. CT scan of the chest was performed which showed mediastinal and axillary lymph nodes with decreasing bilateral pleural effusion. -Continue tapering doses of prednisone -Had a T-max of 100.1 over the last 24 hours.  Anemia of chronic disease -Status post 3 units of packed red cells transfusion during this hospitalization; last transfusion was on 03/24/2020 for hemoglobin of 6.9.  Hemoglobin 9.2 on 03/28/2020.  Monitor  Seizure -Patient had seizure with loss of pulse and PEA arrest status post CPR.  Patient was seen by neurology during his hospitalization. -No further seizures while inpatient.  Continue current doses of Keppra.  Outpatient follow-up with neurology  Rash -Patient developed rash on 03/25/2020 after receiving CHG bath with wipes: Mostly over the thighs and arm.  CHG bath and wipes have been discontinued.  Monitor.  Use loratadine/antiallergic cream for  itching.  Hyponatremia -Being managed by nephrology by dialysis.  Essential hypertension -Blood pressure stable.  Continue nifedipine, metoprolol  Acute on chronic respiratory failure with hypoxia -Resolved.  Extubated on  02/28/2020.  Currently on room air  Cardiac arrest with pulseless electrical activity -Status post CPR and intubation.  Stable at this time  Debility/deconditioning -Will need home health PT on discharge  DVT prophylaxis: SCDs Code Status: Full Family Communication: None at bedside Disposition Plan: Status is: Inpatient  Remains inpatient appropriate because:Inpatient level of care appropriate due to severity of illness.  Left brachycephalic fistula has been thrombosed and will need further intervention by vascular surgery   Dispo:  Patient From: Home  Planned Disposition: Home with home health as soon as outpatient hemodialysis unit is arranged  Medically stable for discharge: No   Consultants: PCCM/nephrology/IR/ID/vascular surgery/neurology  Procedures: Intubation/extubation Renal biopsy IR guided tunneled dialysis catheter placement Left brachiocephalic fistula creation on 03/13/2020  Antimicrobials:  Anti-infectives (From admission, onward)   Start     Dose/Rate Route Frequency Ordered Stop   03/18/20 0900  ceFEPIme (MAXIPIME) 2 g in sodium chloride 0.9 % 100 mL IVPB        2 g 200 mL/hr over 30 Minutes Intravenous  Once 03/18/20 0751 03/18/20 0953   03/17/20 1200  vancomycin (VANCOCIN) IVPB 750 mg/150 ml premix        750 mg 150 mL/hr over 60 Minutes Intravenous Every T-Th-Sa (Hemodialysis) 03/14/20 1027 03/19/20 1158   03/14/20 1200  vancomycin (VANCOCIN) IVPB 1000 mg/200 mL premix        1,000 mg 200 mL/hr over 60 Minutes Intravenous  Once 03/14/20 1027 03/14/20 1641   03/14/20 1200  ceFEPIme (MAXIPIME) 2 g in sodium chloride 0.9 % 100 mL IVPB        2 g 200 mL/hr over 30 Minutes Intravenous Every T-Th-Sa (Hemodialysis) 03/14/20 1027  03/21/20 1159   03/14/20 0600  vancomycin (VANCOREADY) IVPB 1000 mg/200 mL        1,000 mg 200 mL/hr over 60 Minutes Intravenous To Short Stay 03/13/20 0712 03/13/20 0954   03/09/20 0600  vancomycin (VANCOREADY) IVPB 1000 mg/200 mL        1,000 mg 200 mL/hr over 60 Minutes Intravenous On call 03/06/20 1647 03/09/20 1542   03/06/20 1200  vancomycin (VANCOCIN) IVPB 750 mg/150 ml premix  Status:  Discontinued        750 mg 150 mL/hr over 60 Minutes Intravenous  Once 03/05/20 1426 03/05/20 1447   03/05/20 1800  vancomycin (VANCOCIN) IVPB 750 mg/150 ml premix  Status:  Discontinued        750 mg 150 mL/hr over 60 Minutes Intravenous  Once 03/05/20 0831 03/05/20 0834   03/05/20 0930  vancomycin (VANCOREADY) IVPB 1500 mg/300 mL  Status:  Discontinued        1,500 mg 150 mL/hr over 120 Minutes Intravenous  Once 03/05/20 0831 03/05/20 1447   03/05/20 0930  ceFEPIme (MAXIPIME) 1 g in sodium chloride 0.9 % 100 mL IVPB  Status:  Discontinued        1 g 200 mL/hr over 30 Minutes Intravenous Every 24 hours 03/05/20 0831 03/05/20 1447   02/29/20 1000  cefTRIAXone (ROCEPHIN) 2 g in sodium chloride 0.9 % 100 mL IVPB  Status:  Discontinued        2 g 200 mL/hr over 30 Minutes Intravenous Every 24 hours 02/29/20 0910 03/05/20 0756       Subjective: Patient seen and examined at bedside.  No worsening abdominal pain, diarrhea, cough or vomiting reported.  Frustrated that the fistula is not working.  Objective: Vitals:   03/28/20 2002 03/29/20 0419 03/29/20 1405 03/29/20 2141  BP: 97/63  1_0  Pulse: (!) 106 (!) 101 94 88  Resp: _1 Temp: 98 F (36.7 C) 97.9 F (36.6 C) 100.1 F (37.8 C) 98.5 F (36.9 C)  TempSrc: Oral   Oral  SpO2: 99% 99% 98% 96%  Weight:      Height:       No intake or output data in the 24 hours ending 03/30/20 0714 Filed Weights   03/26/20 1154 03/28/20 0839 03/28/20 1244  Weight: 58.8 kg 60 kg 57 kg    Examination:  General exam: No acute  distress.  On room air currently.   Respiratory system: Decreased breath sounds at bases with basilar crackles  cardiovascular system: S1-S2 heard, currently rate controlled  gastrointestinal system: Abdomen is distended mildly, soft and nontender.  Bowel sounds are heard  extremities: Bilateral lower extremity edema present; no clubbing  Data Reviewed: I have personally reviewed following labs and imaging studies  CBC: Recent Labs  Lab 03/24/20 0621 03/25/20 0218 03/26/20 0115 03/28/20 0851  WBC 9.2 8.7 8.3 7.0  NEUTROABS  --   --  6.2  --   HGB 6.9* 9.4* 9.3* 9.2*  HCT 21.1* 28.7* 28.3* 29.5*  MCV 93.0 91.4 90.7 94.6  PLT 250 188 189 702   Basic Metabolic Panel: Recent Labs  Lab 03/24/20 0621 03/25/20 0218 03/26/20 0115 03/28/20 0851  NA 134* 133* 132* 132*  K 3.8 3.8 4.0 4.0  CL 99 97* 97* 97*  CO2 _2 GLUCOSE 90 91 95 101*  BUN 32* 17 35* 32*  CREATININE 8.38* 5.38* 7.62* 7.60*  CALCIUM 8.0* 8.1* 8.4* 8.5*  MG  --  1.8  --   --   PHOS 3.7  --   --  3.4   GFR: Estimated Creatinine Clearance: 6.3 mL/min (A) (by C-G formula based on SCr of 7.6 mg/dL (H)). Liver Function Tests: Recent Labs  Lab 03/24/20 0621 03/28/20 0851  ALBUMIN 1.9* 2.3*   No results for input(s): LIPASE, AMYLASE in the last 168 hours. No results for input(s): AMMONIA in the last 168 hours. Coagulation Profile: No results for input(s): INR, PROTIME in the last 168 hours. Cardiac Enzymes: No results for input(s): CKTOTAL, CKMB, CKMBINDEX, TROPONINI in the last 168 hours. BNP (last 3 results) No results for input(s): PROBNP in the last 8760 hours. HbA1C: No results for input(s): HGBA1C in the last 72 hours. CBG: Recent Labs  Lab 03/29/20 0636 03/29/20 0753 03/29/20 1223 03/29/20 1641 03/29/20 2139  GLUCAP 94 89 87 122* 109*   Lipid Profile: No results for input(s): CHOL, HDL, LDLCALC, TRIG, CHOLHDL, LDLDIRECT in the last 72 hours. Thyroid Function Tests: No results for  input(s): TSH, T4TOTAL, FREET4, T3FREE, THYROIDAB in the last 72 hours. Anemia Panel: No results for input(s): VITAMINB12, FOLATE, FERRITIN, TIBC, IRON, RETICCTPCT in the last 72 hours. Sepsis Labs: No results for input(s): PROCALCITON, LATICACIDVEN in the last 168 hours.  No results found for this or any previous visit (from the past 240 hour(s)).       Radiology Studies: VAS US DUPLEX DIALYSIS ACCESS (AVF, AVG)  Result Date: 03/29/2020 DIALYSIS ACCESS Reason for Exam: No palpable thrill for AVF/AVG. Access Site: Left Upper Extremity. Access Type: Brachial-cephalic AVF. History: Fistula sx done on 03/13/20. Performing Technologist: Abram Sander RVS  Examination Guidelines: A complete evaluation includes B-mode imaging, spectral Doppler, color Doppler, and power Doppler as needed of all accessible portions of each vessel. Unilateral testing is considered an integral  part of a complete examination. Limited examinations for reoccurring indications may be performed as noted.  Findings: +--------------------+----------+-----------------+--------+ AVF                 PSV (cm/s)Flow Vol (mL/min)Comments +--------------------+----------+-----------------+--------+ Native artery inflow    62                              +--------------------+----------+-----------------+--------+   +--------------------+----------+-----------------+--------+ AVG                 PSV (cm/s)Flow Vol (mL/min)Describe +--------------------+----------+-----------------+--------+ Native artery inflow    62                              +--------------------+----------+-----------------+--------+ Arterial anastomosis                           occluded +--------------------+----------+-----------------+--------+ Prox graft                                     occluded +--------------------+----------+-----------------+--------+ Mid graft                                      occluded  +--------------------+----------+-----------------+--------+ Distal graft                                   occluded +--------------------+----------+-----------------+--------+ Venous anastomosis                             occluded +--------------------+----------+-----------------+--------+  Summary: 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  *See table(s) above for measurements and observations.   --------------------------------------------------------------------------------   Preliminary         Scheduled Meds: . chlorhexidine gluconate (MEDLINE KIT)  15 mL Mouth Rinse BID  . [START ON 03/31/2020] darbepoetin (ARANESP) injection - DIALYSIS  100 mcg Intravenous Q Tue-HD  . dextromethorphan  30 mg Oral BID  . docusate sodium  100 mg Oral BID  . feeding supplement  1 Container Oral TID BM  . levETIRAcetam  250 mg Oral Q T,Th,Sa-HD  . levETIRAcetam  500 mg Oral Daily  . mouth rinse  15 mL Mouth Rinse BID  . metoprolol tartrate  75 mg Oral BID  . multivitamin  1 tablet Oral QHS  . NIFEdipine  30 mg Oral Daily  . predniSONE  20 mg Oral Q breakfast   Followed by  . [START ON 04/01/2020] predniSONE  10 mg Oral Q breakfast   Continuous Infusions: . sodium chloride 10 mL/hr at 03/14/20 1441          Kshitiz Alekh, MD [facility] 03/30/2020, 7:14 AM

## 2020-03-30 NOTE — Progress Notes (Addendum)
Nutrition Follow-up  DOCUMENTATION CODES:   Not applicable  INTERVENTION:   Consider liberalizing diet to regular to encourage PO intake  D/c Boost, pt refusing  Ensure Enlive po TID, each supplement provides 350 kcal and 20 grams of protein per pt request  Continue Magic cup TID with meals, each supplement provides 290 kcal and 9 grams of protein  Continue Renal MVI daily  Provided additional renal diet education materials   RD plans to follow-up with pt as able/as appropriate regarding renal diet to address additional questions/concerns.   NUTRITION DIAGNOSIS:   Increased nutrient needs related to acute illness (renal failure requiring CRRT) as evidenced by estimated needs.  ongoing  GOAL:   Patient will meet greater than or equal to 90% of their needs  Progressing  MONITOR:   PO intake,Supplement acceptance,Labs,Weight trends,I & O's  REASON FOR ASSESSMENT:   Consult Diet education  ASSESSMENT:   65 yo female admitted with acute renal failure complicated by seizure and cardiac arrest requiring intubation on 2/23. PMH includes HTN, recent COVID infection Dec 2021.  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  3/07 IJ HD cath [id number] L brachiocephalic fistula placed [id number] CT abd/pelvis showed evolving left perinephric hematoma without acute hemorrhagic component. 3/15 CT showed decreasing bilateral effusion  3/27 fistula duplex showed the fistula has thrombosed; vascular surgery has been consulted  Pt unavailable at time of RD visit. Additional information regarding a renal diet placed in pt's file. Will attempt to provide follow-up education as able. Discussed pt with RN who reports pt's appetite has been improving and that pt ate 100% of breakfast (limited meal documentation available in chart). RN notes pt no longer wants Boost Breeze and would like strawberry Ensure instead. Will make  adjustments.   Note Vascular saw pt and plan is for outpatient arteriovenous graft as pt is anxious to discharge. Vascular cleared pt.   Admission weight: 70.4 kg Current weight: 57 kg (not updated since 3/26)  Last HD 3/26, 3L net UF  Medications: aranesp, colace, rena-vit, deltasone Labs reviewed, not taken since 3/26. CBGs 121-120-129  Diet Order:   Diet Order            Diet Heart Room service appropriate? Yes; Fluid consistency: Thin; Fluid restriction: 1200 mL Fluid  Diet effective now                 EDUCATION NEEDS:   Education needs have been addressed  Skin:  Skin Assessment: Skin Integrity Issues: Skin Integrity Issues:: Incisions Incisions: L arm  Last BM:  3/28  Height:   Ht Readings from Last 1 Encounters:  02/26/20 5' 2.99" (1.6 m)    Weight:   Wt Readings from Last 1 Encounters:  03/31/20 60.1 kg    Ideal Body Weight:  52.3 kg  BMI:  Body mass index is 23.48 kg/m.  Estimated Nutritional Needs:   Kcal:  2100-2300  Protein:  135-155 grams  Fluid:  1000 ml + UOP    Larkin Ina, MS, RD, LDN RD pager number and weekend/on-call pager number located in [place].

## 2020-03-31 DIAGNOSIS — N051 Unspecified nephritic syndrome with focal and segmental glomerular lesions: Secondary | ICD-10-CM | POA: Diagnosis not present

## 2020-03-31 DIAGNOSIS — R8271 Bacteriuria: Secondary | ICD-10-CM | POA: Diagnosis not present

## 2020-03-31 DIAGNOSIS — N179 Acute kidney failure, unspecified: Secondary | ICD-10-CM | POA: Diagnosis not present

## 2020-03-31 DIAGNOSIS — I469 Cardiac arrest, cause unspecified: Secondary | ICD-10-CM | POA: Diagnosis not present

## 2020-03-31 LAB — CBC
HCT: 28.7 % — ABNORMAL LOW (ref 36.0–46.0)
Hemoglobin: 9 g/dL — ABNORMAL LOW (ref 12.0–15.0)
MCH: 29.5 pg (ref 26.0–34.0)
MCHC: 31.4 g/dL (ref 30.0–36.0)
MCV: 94.1 fL (ref 80.0–100.0)
Platelets: 143 10*3/uL — ABNORMAL LOW (ref 150–400)
RBC: 3.05 MIL/uL — ABNORMAL LOW (ref 3.87–5.11)
RDW: 16.7 % — ABNORMAL HIGH (ref 11.5–15.5)
WBC: 6.4 10*3/uL (ref 4.0–10.5)
nRBC: 0 % (ref 0.0–0.2)

## 2020-03-31 LAB — RENAL FUNCTION PANEL
Albumin: 2.3 g/dL — ABNORMAL LOW (ref 3.5–5.0)
Anion gap: 10 (ref 5–15)
BUN: 46 mg/dL — ABNORMAL HIGH (ref 8–23)
CO2: 26 mmol/L (ref 22–32)
Calcium: 8.6 mg/dL — ABNORMAL LOW (ref 8.9–10.3)
Chloride: 95 mmol/L — ABNORMAL LOW (ref 98–111)
Creatinine, Ser: 9.09 mg/dL — ABNORMAL HIGH (ref 0.44–1.00)
GFR, Estimated: 4 mL/min — ABNORMAL LOW (ref >60)
Glucose, Bld: 87 mg/dL (ref 70–99)
Phosphorus: 4.6 mg/dL (ref 2.5–4.6)
Potassium: 4.7 mmol/L (ref 3.5–5.1)
Sodium: 131 mmol/L — ABNORMAL LOW (ref 135–145)

## 2020-03-31 LAB — PTH, INTACT AND CALCIUM
Calcium, Total (PTH): 8.3 mg/dL — ABNORMAL LOW (ref 8.7–10.3)
PTH: 130 pg/mL — ABNORMAL HIGH (ref 15–65)

## 2020-03-31 LAB — BUN: BUN: 7 mg/dL — ABNORMAL LOW (ref 8–23)

## 2020-03-31 MED ORDER — DARBEPOETIN ALFA 100 MCG/0.5ML IJ SOSY
PREFILLED_SYRINGE | INTRAMUSCULAR | Status: AC
  Administered 2020-03-31: 100 ug via INTRAVENOUS
  Filled 2020-03-31: qty 0.5

## 2020-03-31 MED ORDER — ENSURE ENLIVE PO LIQD
237.0000 mL | Freq: Three times a day (TID) | ORAL | Status: DC
  Administered 2020-03-31 – 2020-04-03 (×11): 237 mL via ORAL

## 2020-03-31 NOTE — Progress Notes (Signed)
Renal Navigator faxed updated Attending progress note, Nephrology note and HepB Surface antigen (from 03/26/20) to [facility] and requested expedited referral review for patient who has already been denied by the only MD (with [facility]) in their system to take her previous insurance. Now she has insurance effective 04/03/20 which only their providers (with [facility]) accept. Navigator also requests that if patient is given a TTS schedule, that she be started in the clinic on Friday 04/03/20, given that we already have her new policy number and know that it is effective on 04/03/20, since Navigator knows that their clinic's policy is that they will not start a new patient on a Saturday.  Navigator stated appreciation and will follow closely.   Alphonzo Cruise, [place] [facility] Navigator [phone]

## 2020-03-31 NOTE — Progress Notes (Signed)
Patient ID: Karly Pitter, female   DOB: 04-24-1955, 65 y.o.   MRN: [id number]  PROGRESS NOTE    Dajane Valli  MBW:[id number] DOB: 22-Nov-1955 DOA: 02/25/2020 PCP: Vonna Drafts, FNP   Brief Narrative:  65 yo female with past medical history of hypertension, prior Covid (Dec 2021) presented to the hospital with complaints of lower extremity swelling, back pain, generalized malaise, fatigue at home. In the ED,she was found to have a hemoglobin of 6.3, potassium 5.4, sodium 130, bicarb 9, BUN 144, and creatinine 29.98. CT of the abdomen and pelvis was negative for obstruction or acute abnormality. Temporary hemodialysis catheter was placed by IR and subsequently, the patient had a PEA arrest associated with seizure-like activity. She was intubated and subsequently extubated on 02/28/2020. 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 Renal biopsy was done 03/03/2019. ANA positive. ID followed the patient for fever. Dialysis cath was removed on 03/06/20 and subsequently underwent permacath placement.  During the hospitalization, patient continued to have fever so ID wasreconsulted.Extensive work-up was done. Recent x-ray had shown consolidation in the lungs. Patient was started on vancomycin and cefepime for possible healthcare associated pneumonia and subsequentlycompleted the course of antibiotic.The thought process was that there was possibility of autoimmune disease causing kidney disease and fever. She was treated with prednisone with improvement in fever.   Currently awaiting for outpatient hemodialysis arrangement.  She was noted to have slightly lower hemoglobin level on 03/24/20 requiring 1 unit PRBC transfusion.  No overt bleeding identified.  She is currently medically stable for discharge.  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 CT scan of the abdomen pelvis from 03/16/2020 showed evolving left perinephric hematoma without acute hemorrhagic component.  -Status post IR guided internal jugular tunneled hemodialysis catheter on 03/09/2020 and left brachiocephalic fistula creation on 03/13/2020. -Nephrology is following and dialysis as per nephrology schedule.  Awaiting outpatient hemodialysis unit arrangement -Fistula duplex on 03/29/2020 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  Vascular surgery was reconsulted: They are planning for further intervention as an outpatient.  Fever/possible healthcare associated pneumonia:  -Has completed course of vancomycin and cefepime.  Blood/urine cultures have been negative so far -Duplex ultrasound was negative for DVT. -Continue incentive spirometry -Possible autoimmune etiology of fever as well. CT scan of the abdomen pelvis with evolving left perinephric hematoma. CT scan of the chest was performed which showed mediastinal and axillary lymph nodes with decreasing bilateral pleural effusion. -Continue tapering doses of prednisone -No temperature spikes over the last 24 hours.  Anemia of chronic disease -Status post 3 units of packed red cells transfusion during this hospitalization; last transfusion was on 03/24/2020 for hemoglobin of 6.9.  Hemoglobin 9 today.  Seizure -Patient had seizure with loss of pulse and PEA arrest status post CPR.  Patient was seen by neurology during his hospitalization. -No further seizures while inpatient.  Continue current doses of Keppra.  Outpatient follow-up with neurology  Rash -Patient developed rash on 03/25/2020 after receiving CHG bath with wipes: Mostly over the thighs and arm.  CHG bath and wipes have been discontinued.  Monitor.  Use loratadine/antiallergic  cream for itching.  Hyponatremia -Being managed by nephrology by dialysis.  Essential hypertension -Blood pressure stable.  Continue nifedipine, metoprolol  Acute on chronic respiratory failure with hypoxia -Resolved.  Extubated on 02/28/2020.  Currently on room air  Cardiac arrest with pulseless electrical activity -Status post CPR and intubation.  Stable at this time  Debility/deconditioning -Will need home health PT on discharge  DVT prophylaxis: SCDs Code Status: Full Family Communication: None at bedside Disposition Plan: Status is: Inpatient  Remains inpatient appropriate because:Inpatient level of care appropriate due to severity of illness.  Left brachycephalic fistula has been thrombosed and will need further intervention by vascular surgery   Dispo:  Patient From: Home  Planned Disposition: Home with home health as soon as outpatient hemodialysis unit is arranged  Medically stable for discharge: No   Consultants: PCCM/nephrology/IR/ID/vascular surgery/neurology  Procedures: Intubation/extubation Renal biopsy IR guided tunneled dialysis catheter placement Left brachiocephalic fistula creation on 03/13/2020  Antimicrobials:  Anti-infectives (From admission, onward)   Start     Dose/Rate Route Frequency Ordered Stop   03/18/20 0900  ceFEPIme (MAXIPIME) 2 g in sodium chloride 0.9 % 100 mL IVPB        2 g 200 mL/hr over 30 Minutes Intravenous  Once 03/18/20 0751 03/18/20 0953   03/17/20 1200  vancomycin (VANCOCIN) IVPB 750 mg/150 ml premix        750 mg 150 mL/hr over 60 Minutes Intravenous Every T-Th-Sa (Hemodialysis) 03/14/20 1027 03/19/20 1158   03/14/20 1200  vancomycin (VANCOCIN) IVPB 1000 mg/200 mL premix        1,000 mg 200 mL/hr over 60 Minutes Intravenous  Once 03/14/20 1027 03/14/20 1641   03/14/20 1200  ceFEPIme (MAXIPIME) 2 g in sodium chloride 0.9 % 100 mL IVPB        2 g 200 mL/hr over 30 Minutes Intravenous Every T-Th-Sa (Hemodialysis) 03/14/20  1027 03/21/20 1159   03/14/20 0600  vancomycin (VANCOREADY) IVPB 1000 mg/200 mL        1,000 mg 200 mL/hr over 60 Minutes Intravenous To Short Stay 03/13/20 0712 03/13/20 0954   03/09/20 0600  vancomycin (VANCOREADY) IVPB 1000 mg/200 mL        1,000 mg 200 mL/hr over 60 Minutes Intravenous On call 03/06/20 1647 03/09/20 1542   03/06/20 1200  vancomycin (VANCOCIN) IVPB 750 mg/150 ml premix  Status:  Discontinued        750 mg 150 mL/hr over 60 Minutes Intravenous  Once 03/05/20 1426 03/05/20 1447   03/05/20 1800  vancomycin (VANCOCIN) IVPB 750 mg/150 ml premix  Status:  Discontinued        750 mg 150 mL/hr over 60 Minutes Intravenous  Once 03/05/20 0831 03/05/20 0834   03/05/20 0930  vancomycin (VANCOREADY) IVPB 1500 mg/300 mL  Status:  Discontinued        1,500 mg 150 mL/hr over 120 Minutes Intravenous  Once 03/05/20 0831 03/05/20 1447   03/05/20 0930  ceFEPIme (MAXIPIME) 1 g in sodium chloride 0.9 % 100 mL IVPB  Status:  Discontinued        1 g 200 mL/hr over 30 Minutes Intravenous Every 24 hours 03/05/20 0831 03/05/20 1447   02/29/20 1000  cefTRIAXone (ROCEPHIN) 2 g in sodium chloride 0.9 % 100 mL IVPB  Status:  Discontinued        2 g 200 mL/hr over 30 Minutes Intravenous Every 24 hours 02/29/20 0910 03/05/20 0756       Subjective: Patient seen and examined at bedside undergoing dialysis.  Denies chest pain, worsening shortness of breath, abdominal pain or diarrhea. Objective: Vitals:   03/29/20 2141 03/30/20 0937 03/30/20 1853 03/30/20 2116  BP: 92/63 (!) 88/65 108/67  101/74  Pulse: 88 (!) 108 (!) 104 96  Resp: 16 19 18 18   Temp: 98.5 F (36.9 C) 98 F (36.7 C) 98.1 F (36.7 C) 98 F (36.7 C)  TempSrc: Oral Oral Oral Oral  SpO2: 96% 100% 100% 100%  Weight:      Height:       No intake or output data in the 24 hours ending 03/31/20 0742 Filed Weights   03/26/20 1154 03/28/20 0839 03/28/20 1244  Weight: 58.8 kg 60 kg 57 kg    Examination:  General exam: Currently  on room air.  No distress. Respiratory system: Bilateral decreased breath sounds at bases with some scattered crackles  cardiovascular system: Rate controlled, S1-S2 heard gastrointestinal system: Abdomen is slightly distended, soft and nontender.  Normal bowel sounds heard  extremities: No cyanosis; trace lower extremity edema present  Data Reviewed: I have personally reviewed following labs and imaging studies  CBC: Recent Labs  Lab 03/25/20 0218 03/26/20 0115 03/28/20 0851 03/31/20 0651  WBC 8.7 8.3 7.0 6.4  NEUTROABS  --  6.2  --   --   HGB 9.4* 9.3* 9.2* 9.0*  HCT 28.7* 28.3* 29.5* 28.7*  MCV 91.4 90.7 94.6 94.1  PLT 188 189 165 500*   Basic Metabolic Panel: Recent Labs  Lab 03/25/20 0218 03/26/20 0115 03/28/20 0851 03/30/20 0411  NA 133* 132* 132*  --   K 3.8 4.0 4.0  --   CL 97* 97* 97*  --   CO2 27 25 26   --   GLUCOSE 91 95 101*  --   BUN 17 35* 32*  --   CREATININE 5.38* 7.62* 7.60*  --   CALCIUM 8.1* 8.4* 8.5* 8.3*  MG 1.8  --   --   --   PHOS  --   --  3.4  --    GFR: Estimated Creatinine Clearance: 6.3 mL/min (A) (by C-G formula based on SCr of 7.6 mg/dL (H)). Liver Function Tests: Recent Labs  Lab 03/28/20 0851  ALBUMIN 2.3*   No results for input(s): LIPASE, AMYLASE in the last 168 hours. No results for input(s): AMMONIA in the last 168 hours. Coagulation Profile: No results for input(s): INR, PROTIME in the last 168 hours. Cardiac Enzymes: No results for input(s): CKTOTAL, CKMB, CKMBINDEX, TROPONINI in the last 168 hours. BNP (last 3 results) No results for input(s): PROBNP in the last 8760 hours. HbA1C: No results for input(s): HGBA1C in the last 72 hours. CBG: Recent Labs  Lab 03/29/20 2139 03/30/20 0732 03/30/20 1153 03/30/20 1526 03/30/20 1944  GLUCAP 109* 89 121* 120* 129*   Lipid Profile: No results for input(s): CHOL, HDL, LDLCALC, TRIG, CHOLHDL, LDLDIRECT in the last 72 hours. Thyroid Function Tests: No results for  input(s): TSH, T4TOTAL, FREET4, T3FREE, THYROIDAB in the last 72 hours. Anemia Panel: No results for input(s): VITAMINB12, FOLATE, FERRITIN, TIBC, IRON, RETICCTPCT in the last 72 hours. Sepsis Labs: No results for input(s): PROCALCITON, LATICACIDVEN in the last 168 hours.  No results found for this or any previous visit (from the past 240 hour(s)).       Radiology Studies: VAS US DUPLEX DIALYSIS ACCESS (AVF, AVG)  Result Date: 03/29/2020 DIALYSIS ACCESS Reason for Exam: No palpable thrill for AVF/AVG. Access Site: Left Upper Extremity. Access Type: Brachial-cephalic AVF. History: Fistula sx done on 03/13/20. Performing Technologist: Abram Sander RVS  Examination Guidelines: A complete evaluation includes B-mode imaging, spectral Doppler, color Doppler, and power Doppler as needed of all accessible portions of  each vessel. Unilateral testing is considered an integral part of a complete examination. Limited examinations for reoccurring indications may be performed as noted.  Findings: +--------------------+----------+-----------------+--------+ AVF                 PSV (cm/s)Flow Vol (mL/min)Comments +--------------------+----------+-----------------+--------+ Native artery inflow    62                              +--------------------+----------+-----------------+--------+   +--------------------+----------+-----------------+--------+ AVG                 PSV (cm/s)Flow Vol (mL/min)Describe +--------------------+----------+-----------------+--------+ Native artery inflow    62                              +--------------------+----------+-----------------+--------+ Arterial anastomosis                           occluded +--------------------+----------+-----------------+--------+ Prox graft                                     occluded +--------------------+----------+-----------------+--------+ Mid graft                                      occluded  +--------------------+----------+-----------------+--------+ Distal graft                                   occluded +--------------------+----------+-----------------+--------+ Venous anastomosis                             occluded +--------------------+----------+-----------------+--------+  Summary: 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  *See table(s) above for measurements and observations.   --------------------------------------------------------------------------------   Preliminary         Scheduled Meds: . chlorhexidine gluconate (MEDLINE KIT)  15 mL Mouth Rinse BID  . darbepoetin (ARANESP) injection - DIALYSIS  100 mcg Intravenous Q Tue-HD  . dextromethorphan  30 mg Oral BID  . docusate sodium  100 mg Oral BID  . feeding supplement  1 Container Oral TID BM  . levETIRAcetam  250 mg Oral Q T,Th,Sa-HD  . levETIRAcetam  500 mg Oral Daily  . mouth rinse  15 mL Mouth Rinse BID  . metoprolol tartrate  75 mg Oral BID  . multivitamin  1 tablet Oral QHS  . NIFEdipine  30 mg Oral Daily  . predniSONE  20 mg Oral Q breakfast   Followed by  . [START ON 04/01/2020] predniSONE  10 mg Oral Q breakfast   Continuous Infusions: . sodium chloride 10 mL/hr at 03/14/20 1441          Naasir Carreira, MD [facility] 03/31/2020, 7:42 AM

## 2020-03-31 NOTE — Progress Notes (Signed)
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  Continue dialysis on TTS schedule with process underway for outpatient dialysis unit placement (this has been difficult due to the unique insurance coverage that the patient has, this hopefully will be resolved very soon). -lue bc avf 3/11 clotted, VVS aware; appreciate assistance --> planning AVG outpt in near future -Cont HD via RIJ TDC -URR today 84% on 3.5h tx, 3.25h next treatment  Suspected healthcare associated pneumonia: s/p cefepime and vancomycin and remains intermittently febrile.  She has fever of unknown origin that has been worked up with imaging/cultures.  Was started her on prednisone taper for suspected autoimmune mechanism associated fevers, mgmt per primary.  Anemia: Likely from underlying chronic disease and surgical losses.  Ferritin level very high. Continue Aranesp and transfuse as needed. increased dose to 129mg qtuesdays  Hypertension/Volume: LE  edema manage with dialysis/Ultrafiltration, significantly improved. BP on softer side, decreased procardia xl to 317mdaily, stopping labetalol prns. Monitor for now, hold procardia xl on HD days.  Secondary hyperparathyroidism: Calcium and phosphorus under acceptable control, not on binders at this time.  PTH level was 193 on 2/23-at goal for ESRD.  Hyponatremia: stable and mild. Managed with dialysis, continue fluid restriction.  Subjective: Seen and examined at bedside.  Tolerated HD well today.  Disappointed by clotted AVF.     Objective Vital signs in last 24 hours: Vitals:   03/31/20 1030 03/31/20 1055 03/31/20 1130 03/31/20 1214  BP:  95/62 102/71 105/66  Pulse:   94 (!) 107  Resp: (!) 32 (!) 22 20 17   Temp:   98.4 F (36.9 C) 98.3 F (36.8  C)  TempSrc:   Oral Oral  SpO2:   99%   Weight:   60.1 kg   Height:       Weight change:   Intake/Output Summary (Last 24 hours) at 03/31/2020 1414 Last data filed at 03/31/2020 1200 Gross per 24 hour  Intake 237 ml  Output 276 ml  Net -39 ml       Labs: Basic Metabolic Panel: Recent Labs  Lab 03/26/20 0115 03/28/20 0851 03/30/20 0411 03/31/20 0651 03/31/20 1120  NA 132* 132*  --  131*  --   K 4.0 4.0  --  4.7  --   CL 97* 97*  --  95*  --   CO2 25 26  --  26  --   GLUCOSE 95 101*  --  87  --   BUN 35* 32*  --  46* 7*  CREATININE 7.62* 7.60*  --  9.09*  --   CALCIUM 8.4* 8.5* 8.3* 8.6*  --   PHOS  --  3.4  --  4.6  --    Liver Function Tests: Recent Labs  Lab 03/28/20 0851 03/31/20 0651  ALBUMIN 2.3* 2.3*   No results for input(s): LIPASE, AMYLASE in the last 168 hours. No results for input(s): AMMONIA in the last 168 hours. CBC: Recent Labs  Lab 03/25/20 0218 03/26/20 0115 03/28/20 0851 03/31/20 0651  WBC 8.7 8.3 7.0 6.4  NEUTROABS  --  6.2  --   --   HGB 9.4* 9.3* 9.2* 9.0*  HCT 28.7* 28.3* 29.5* 28.7*  MCV 91.4 90.7 94.6 94.1  PLT 188 189 165 143*   Cardiac Enzymes: No results for input(s): CKTOTAL, CKMB, CKMBINDEX, TROPONINI  in the last 168 hours. CBG: Recent Labs  Lab 03/29/20 2139 03/30/20 0732 03/30/20 1153 03/30/20 1526 03/30/20 1944  GLUCAP 109* 89 121* 120* 129*    Iron Studies: No results for input(s): IRON, TIBC, TRANSFERRIN, FERRITIN in the last 72 hours. Studies/Results: VAS US DUPLEX DIALYSIS ACCESS (AVF, AVG)  Result Date: 03/29/2020 DIALYSIS ACCESS Reason for Exam: No palpable thrill for AVF/AVG. Access Site: Left Upper Extremity. Access Type: Brachial-cephalic AVF. History: Fistula sx done on 03/13/20. Performing Technologist: Abram Sander RVS  Examination Guidelines: A complete evaluation includes B-mode imaging, spectral Doppler, color Doppler, and power Doppler as needed of all accessible portions of each vessel.  Unilateral testing is considered an integral part of a complete examination. Limited examinations for reoccurring indications may be performed as noted.  Findings: +--------------------+----------+-----------------+--------+ AVF                 PSV (cm/s)Flow Vol (mL/min)Comments +--------------------+----------+-----------------+--------+ Native artery inflow    62                              +--------------------+----------+-----------------+--------+   +--------------------+----------+-----------------+--------+ AVG                 PSV (cm/s)Flow Vol (mL/min)Describe +--------------------+----------+-----------------+--------+ Native artery inflow    62                              +--------------------+----------+-----------------+--------+ Arterial anastomosis                           occluded +--------------------+----------+-----------------+--------+ Prox graft                                     occluded +--------------------+----------+-----------------+--------+ Mid graft                                      occluded +--------------------+----------+-----------------+--------+ Distal graft                                   occluded +--------------------+----------+-----------------+--------+ Venous anastomosis                             occluded +--------------------+----------+-----------------+--------+  Summary: 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  *See table(s) above for measurements and observations.   --------------------------------------------------------------------------------   Preliminary     Medications: Infusions: . sodium chloride 10 mL/hr at 03/14/20 1441    Scheduled Medications: . chlorhexidine gluconate (MEDLINE KIT)  15 mL Mouth Rinse BID  . darbepoetin (ARANESP) injection - DIALYSIS  100 mcg Intravenous Q Tue-HD  . docusate sodium  100 mg Oral BID  . feeding supplement  237 mL Oral TID BM  . levETIRAcetam  250 mg Oral Q T,Th,Sa-HD   . levETIRAcetam  500 mg Oral Daily  . mouth rinse  15 mL Mouth Rinse BID  . metoprolol tartrate  75 mg Oral BID  . multivitamin  1 tablet Oral QHS  . NIFEdipine  30 mg Oral Daily  . predniSONE  20 mg Oral Q breakfast   Followed by  . [START ON 04/01/2020] predniSONE  10 mg Oral Q breakfast  have reviewed scheduled and prn medications.  Physical Exam: General:NAD, laying flat in bed Heart:RRR, s1s2 nl Lungs:clear b/l, no w/r/r/c, unlabored, bl chest expansion Abdomen:soft, Non-tender, non-distended Extremities: no edema bl LE's Dialysis Access: Right IJ TDC, site clean. LUE avf no thrill or bruit   Justin Mend 03/31/2020,2:14 PM  LOS: 35 days

## 2020-03-31 NOTE — Progress Notes (Signed)
Renal Navigator met with patient at HD bedside to obtain signature on [place] application and submitted application to [employer] for review. Navigator explained that patient cannot receive HD in [place] due to insurance plan. Patient has applied for Medicaid, which will cover transportation if approved. Navigator will update [name] once outpatient HD schedule is provided by clinic. All of this has been discussed with patient. Patient and RN Dialysis Coordinator/K. Laurance Flatten have questions as to whether patient should be on the previously ordered Heart Healthy diet or if she should have a Renal diet ordered. Navigator asked Nephrologist/Dr. Johnney Ou to take a look. She agree. Navigator appreciative. Navigator or RN will follow up with patient once updated by Dr. Johnney Ou.   Alphonzo Cruise, [place] Renal Navigator [phone]

## 2020-04-01 DIAGNOSIS — N178 Other acute kidney failure: Secondary | ICD-10-CM | POA: Diagnosis not present

## 2020-04-01 DIAGNOSIS — N051 Unspecified nephritic syndrome with focal and segmental glomerular lesions: Secondary | ICD-10-CM | POA: Diagnosis not present

## 2020-04-01 DIAGNOSIS — J9621 Acute and chronic respiratory failure with hypoxia: Secondary | ICD-10-CM | POA: Diagnosis not present

## 2020-04-01 DIAGNOSIS — I469 Cardiac arrest, cause unspecified: Secondary | ICD-10-CM | POA: Diagnosis not present

## 2020-04-01 LAB — CBC WITH DIFFERENTIAL/PLATELET
Abs Immature Granulocytes: 0.05 10*3/uL (ref 0.00–0.07)
Basophils Absolute: 0 10*3/uL (ref 0.0–0.1)
Basophils Relative: 1 %
Eosinophils Absolute: 0.3 10*3/uL (ref 0.0–0.5)
Eosinophils Relative: 5 %
HCT: 26 % — ABNORMAL LOW (ref 36.0–46.0)
Hemoglobin: 8.5 g/dL — ABNORMAL LOW (ref 12.0–15.0)
Immature Granulocytes: 1 %
Lymphocytes Relative: 17 %
Lymphs Abs: 1.2 10*3/uL (ref 0.7–4.0)
MCH: 30.5 pg (ref 26.0–34.0)
MCHC: 32.7 g/dL (ref 30.0–36.0)
MCV: 93.2 fL (ref 80.0–100.0)
Monocytes Absolute: 0.6 10*3/uL (ref 0.1–1.0)
Monocytes Relative: 9 %
Neutro Abs: 4.6 10*3/uL (ref 1.7–7.7)
Neutrophils Relative %: 67 %
Platelets: 132 10*3/uL — ABNORMAL LOW (ref 150–400)
RBC: 2.79 MIL/uL — ABNORMAL LOW (ref 3.87–5.11)
RDW: 16.5 % — ABNORMAL HIGH (ref 11.5–15.5)
WBC: 6.8 10*3/uL (ref 4.0–10.5)
nRBC: 0 % (ref 0.0–0.2)

## 2020-04-01 LAB — BASIC METABOLIC PANEL
Anion gap: 8 (ref 5–15)
BUN: 24 mg/dL — ABNORMAL HIGH (ref 8–23)
CO2: 28 mmol/L (ref 22–32)
Calcium: 8.6 mg/dL — ABNORMAL LOW (ref 8.9–10.3)
Chloride: 99 mmol/L (ref 98–111)
Creatinine, Ser: 5.64 mg/dL — ABNORMAL HIGH (ref 0.44–1.00)
GFR, Estimated: 8 mL/min — ABNORMAL LOW (ref >60)
Glucose, Bld: 89 mg/dL (ref 70–99)
Potassium: 4.7 mmol/L (ref 3.5–5.1)
Sodium: 135 mmol/L (ref 135–145)

## 2020-04-01 LAB — GLUCOSE, CAPILLARY
Glucose-Capillary: 86 mg/dL (ref 70–99)
Glucose-Capillary: 102 mg/dL — ABNORMAL HIGH (ref 70–99)
Glucose-Capillary: 125 mg/dL — ABNORMAL HIGH (ref 70–99)
Glucose-Capillary: 118 mg/dL — ABNORMAL HIGH (ref 70–99)

## 2020-04-01 MED ORDER — HEPARIN SODIUM (PORCINE) 5000 UNIT/ML IJ SOLN
5000.0000 [IU] | Freq: Three times a day (TID) | INTRAMUSCULAR | Status: DC
  Administered 2020-04-02: 5000 [IU] via SUBCUTANEOUS
  Filled 2020-04-01 (×3): qty 1

## 2020-04-01 NOTE — Progress Notes (Signed)
Physical Therapy Treatment Patient Details Name: Barbara Crawford MRN: [id number] DOB: Nov 11, 1955 Today's Date: 04/01/2020    History of Present Illness 65 y.o. female who reported to ED with complaints of B LE swelling and LBP. CT abd/pelvis - no obstruction or acute abnormality. HGB 6.3. Pt admitted on 02/25/20 and diagnosed with acute renal faiulre. 2/23 pt had seizure and Code Blue with CPR x 4 minutes and intubated 2/23-2/25; CRRT; renal biopsy 2/28 with perinephric hematoma; pneumonia;  IJ catheter placed and HD initiated. Dialysis access placed 3/11. PMH includes HTN and COVID, which pt had 1 week prior to presenting to ED.    PT Comments    Pt admitted with above diagnosis. Pt was able to ambulate in hallway with RW with good cadence as well as good stability. Progressed distance as well.  Pt currently with functional limitations due to balance and endurance deficits. Pt will benefit from skilled PT to increase their independence and safety with mobility to allow discharge to the venue listed below.     Follow Up Recommendations  Home health PT;Supervision - Intermittent     Equipment Recommendations  Rolling walker with 5" wheels    Recommendations for Other Services OT consult     Precautions / Restrictions Precautions Precautions: Fall Restrictions Weight Bearing Restrictions: No    Mobility  Bed Mobility               General bed mobility comments: Pt was sitting on EOB on arrival.    Transfers Overall transfer level: Needs assistance Equipment used: Rolling walker (2 wheeled) Transfers: Sit to/from Stand Sit to Stand: Supervision            Ambulation/Gait Ambulation/Gait assistance: Min guard Gait Distance (Feet): 225 Feet Assistive device: Rolling walker (2 wheeled) Gait Pattern/deviations: Step-through pattern;Decreased stride length;Wide base of support Gait velocity: slow Gait velocity interpretation: <1.31 ft/sec, indicative of  household ambulator General Gait Details: Pt with good cadence. Pt without LOB.  Pt states she feels so much better today.   Stairs             Wheelchair Mobility    Modified Rankin (Stroke Patients Only)       Balance Overall balance assessment: Modified Independent Sitting-balance support: Feet supported Sitting balance-Leahy Scale: Good     Standing balance support: During functional activity;Bilateral upper extremity supported Standing balance-Leahy Scale: Fair Standing balance comment: Can stand without UE support                            Cognition Arousal/Alertness: Awake/alert Behavior During Therapy: WFL for tasks assessed/performed Overall Cognitive Status: Within Functional Limits for tasks assessed                                        Exercises      General Comments General comments (skin integrity, edema, etc.): VSS      Pertinent Vitals/Pain Pain Assessment: No/denies pain    Home Living                      Prior Function            PT Goals (current goals can now be found in the care plan section) Acute Rehab PT Goals Patient Stated Goal: To return home. Progress towards PT goals: Progressing toward goals  Frequency    Min 3X/week      PT Plan Current plan remains appropriate    Co-evaluation              AM-PAC PT "6 Clicks" Mobility   Outcome Measure  Help needed turning from your back to your side while in a flat bed without using bedrails?: None Help needed moving from lying on your back to sitting on the side of a flat bed without using bedrails?: A Little Help needed moving to and from a bed to a chair (including a wheelchair)?: A Little Help needed standing up from a chair using your arms (e.g., wheelchair or bedside chair)?: A Little Help needed to walk in hospital room?: A Little Help needed climbing 3-5 steps with a railing? : A Little 6 Click Score: 19    End of  Session Equipment Utilized During Treatment: Gait belt Activity Tolerance: Patient limited by fatigue Patient left: with call bell/phone within reach;in bed (sitting EOB) Nurse Communication: Mobility status PT Visit Diagnosis: Muscle weakness (generalized) (M62.81)     Time: [id number] PT Time Calculation (min) (ACUTE ONLY): 20 min  Charges:  $Gait Training: 8-22 mins                     Jereline Ticer M,PT Acute Rehab Services [phone] (pager)   Alvira Philips 04/01/2020, 2:07 PM

## 2020-04-01 NOTE — Progress Notes (Signed)
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  Continue dialysis on TTS schedule with process underway for outpatient dialysis unit placement (this has been difficult due to the unique insurance coverage that the patient has, this hopefully will be resolved very soon). -lue bc avf 3/11 clotted, VVS aware; appreciate assistance --> planning AVG outpt in near future -Cont HD via RIJ TDC -URR today 84% on 3.5h tx, 3.25h next treatment  Anemia: Likely from underlying chronic disease and surgical losses.  Ferritin level very high. Continue Aranesp and transfuse as needed. increased dose to 153mg qtuesdays  Hypertension/Volume: Euvolemic, minimal UOP.  UF to manage volume  Secondary hyperparathyroidism: Calcium and phosphorus under acceptable control, not on binders at this time.  PTH level was 193 on 2/23-at goal for ESRD.  Hyponatremia: stable and mild. Managed with dialysis, continue fluid restriction.  Dispo pending acceptance at outpt HD unit which has been challenging related to insurance issues.  Subjective: Seen and examined at bedside.  Tolerated HD well yesterday.  Awaits dispo.  Objective Vital signs in last 24 hours: Vitals:   03/31/20 1130 03/31/20 1214 03/31/20 1951 04/01/20 0401  BP: 102/71 105/66 117/80 106/66  Pulse: 94 (!) 107 (!) 110 81  Resp: _0 Temp: 98.4 F (36.9 C) 98.3 F (36.8 C) 99.4 F (37.4 C) (!) 97.5 F (36.4 C)  TempSrc: Oral Oral Oral Oral  SpO2: 99%  100% 99%  Weight: 60.1 kg     Height:       Weight change:   Intake/Output Summary (Last 24 hours) at 04/01/2020 1222 Last data filed at 04/01/2020 0900 Gross per 24 hour  Intake 477 ml  Output --  Net 477 ml       Labs: Basic Metabolic Panel: Recent Labs   Lab 03/28/20 0851 03/30/20 0411 03/31/20 0651 03/31/20 1120 04/01/20 0342  NA 132*  --  131*  --  135  K 4.0  --  4.7  --  4.7  CL 97*  --  95*  --  99  CO2 26  --  26  --  28  GLUCOSE 101*  --  87  --  89  BUN 32*  --  46* 7* 24*  CREATININE 7.60*  --  9.09*  --  5.64*  CALCIUM 8.5* 8.3* 8.6*  --  8.6*  PHOS 3.4  --  4.6  --   --    Liver Function Tests: Recent Labs  Lab 03/28/20 0851 03/31/20 0651  ALBUMIN 2.3* 2.3*   No results for input(s): LIPASE, AMYLASE in the last 168 hours. No results for input(s): AMMONIA in the last 168 hours. CBC: Recent Labs  Lab 03/26/20 0115 03/28/20 0851 03/31/20 0651 04/01/20 0342  WBC 8.3 7.0 6.4 6.8  NEUTROABS 6.2  --   --  4.6  HGB 9.3* 9.2* 9.0* 8.5*  HCT 28.3* 29.5* 28.7* 26.0*  MCV 90.7 94.6 94.1 93.2  PLT 189 165 143* 132*   Cardiac Enzymes: No results for input(s): CKTOTAL, CKMB, CKMBINDEX, TROPONINI in the last 168 hours. CBG: Recent Labs  Lab 03/30/20 1153 03/30/20 1526 03/30/20 1944 04/01/20 0751 04/01/20 1213  GLUCAP 121* 120* 129* 86 102*    Iron Studies: No results for input(s): IRON, TIBC, TRANSFERRIN, FERRITIN in the last 72 hours. Studies/Results: No results found.  Medications:  Infusions: . sodium chloride 10 mL/hr at 03/14/20 1441    Scheduled Medications: . chlorhexidine gluconate (MEDLINE KIT)  15 mL Mouth Rinse BID  . darbepoetin (ARANESP) injection - DIALYSIS  100 mcg Intravenous Q Tue-HD  . docusate sodium  100 mg Oral BID  . feeding supplement  237 mL Oral TID BM  . levETIRAcetam  250 mg Oral Q T,Th,Sa-HD  . levETIRAcetam  500 mg Oral Daily  . mouth rinse  15 mL Mouth Rinse BID  . metoprolol tartrate  75 mg Oral BID  . multivitamin  1 tablet Oral QHS  . NIFEdipine  30 mg Oral Daily  . predniSONE  10 mg Oral Q breakfast    have reviewed scheduled and prn medications.  Physical Exam: General:NAD, laying flat in bed Heart:RRR, s1s2 nl Lungs:clear b/l, no w/r/r/c, unlabored, bl  chest expansion Abdomen:soft, Non-tender, non-distended Extremities: no edema bl LE's Dialysis Access: Right IJ TDC, site clean. LUE avf no thrill or bruit   Lindsay A Kruska 04/01/2020,12:22 PM  LOS: 36 days   

## 2020-04-01 NOTE — Progress Notes (Signed)
Renal Navigator called [facility] Admissions Coordinator/K. Atkins to request update on referral to [facility] for outpatient seat. She reports that all updated records have been sent to Dr. Rocco/Nephrologist for review and we are awaiting medical acceptance. Ms. Orvan Seen reports that patient has been financially cleared at this time.  Navigator spoke with patient to provide her with an update and discuss that her discharge may feel abrupt after so much time in the hospital, suggesting that she should be preparing herself for discharge as soon as Navigator receives a seat schedule from her clinic. Navigator sees no reason why she should not be medically accepted (given that there are more providers who accept her new insurance plan). Navigator will still need to arrange transportation with [facility] when seat is confirmed--application has been submitted, but transportation cannot be arranged until we know her schedule.  Navigator will continue to follow closely.  Alphonzo Cruise, [place] Renal Navigator [phone]

## 2020-04-01 NOTE — Progress Notes (Signed)
Patient ID: Barbara Crawford, female   DOB: 27-Jun-1955, 65 y.o.   MRN: [id number]  PROGRESS NOTE    Mia Milan  [id number]:[id number] DOB: May 04, 1955 DOA: 02/25/2020 PCP: Vonna Drafts, FNP   Brief Narrative:  65 yo female with past medical history of hypertension, prior Covid (Dec 2021) presented to the hospital with complaints of lower extremity swelling, back pain, generalized malaise, fatigue at home. In the ED,she was found to have a hemoglobin of 6.3, potassium 5.4, sodium 130, bicarb 9, BUN 144, and creatinine 29.98. CT of the abdomen and pelvis was negative for obstruction or acute abnormality. Temporary hemodialysis catheter was placed by IR and subsequently, the patient had a PEA arrest associated with seizure-like activity. She was intubated and subsequently extubated on 02/28/2020. 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 Renal biopsy was done 03/03/2019. ANA positive. ID followed the patient for fever. Dialysis cath was removed on 03/06/20 and subsequently underwent permacath placement. During the hospitalization, patient continued to have fever so ID wasreconsulted.Extensive work-up was done. Recent x-ray had shown consolidation in the lungs. Patient was started on vancomycin and cefepime for possible healthcare associated pneumonia and subsequentlycompleted the course of antibiotic.The thought process was that there was possibility of autoimmune disease causing kidney disease and fever. She was treated with prednisone with improvement in fever. Currently awaiting for outpatient hemodialysis arrangement.  She was noted to have slightly lower hemoglobin level on 03/24/20 requiring 1 unit PRBC transfusion.  No overt bleeding identified. She is currently medically stable for discharge.  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  Vascular surgery was reconsulted: They are planning for further intervention as an outpatient. Nephrology is following and dialysis as per nephrology schedule.  Awaiting outpatient hemodialysis unit arrangement  Fever/possible healthcare associated pneumonia Remains afebrile in the last 24H Possible autoimmune etiology of fever Completed course of vancomycin and cefepime Blood/urine cultures have been negative so far Duplex ultrasound was negative for DVT. Continue incentive spirometry CT scan of the abdomen pelvis with evolving left perinephric hematoma. CT scan of the chest was performed which showed mediastinal and axillary lymph nodes with decreasing bilateral pleural effusion. Continue tapering doses of prednisone  Anemia of chronic disease Hemoglobin stable Status post 3 units of packed red cells transfusion during this hospitalization; last transfusion was on 03/24/2020 for hemoglobin of 6.9  Seizure Patient had seizure with loss of pulse and PEA arrest status post CPR Patient was seen by neurology during his hospitalization. No further seizures while inpatient.  Continue current doses of Keppra.  Outpatient follow-up with neurology  Rash -Patient developed rash on 03/25/2020 after receiving CHG bath with wipes: Mostly over the thighs and arm.  CHG bath and wipes have been discontinued.  Monitor.  Use loratadine/antiallergic cream for itching  Essential hypertension Blood pressure stable Continue nifedipine, metoprolol  Acute on chronic respiratory failure with hypoxia Resolved.  Extubated on 02/28/2020.  Currently on room air  Cardiac arrest with pulseless electrical  activity Status post CPR and intubation.  Stable at this time  Debility/deconditioning -Will need home health PT on discharge    DVT prophylaxis: Heparin started on 04/01/20 Code Status: Full Family Communication:  None at bedside Disposition Plan: Status is: Inpatient  Remains inpatient appropriate because:Inpatient level of care appropriate due to severity of illness.     Dispo:  Patient From: Home  Planned Disposition: Home with home health as soon as outpatient hemodialysis unit is arranged  Medically stable for discharge: Yes   Consultants: PCCM/nephrology/IR/ID/vascular surgery/neurology  Procedures: Intubation/extubation Renal biopsy IR guided tunneled dialysis catheter placement Left brachiocephalic fistula creation on 03/13/2020  Antimicrobials:  Anti-infectives (From admission, onward)   Start     Dose/Rate Route Frequency Ordered Stop   03/18/20 0900  ceFEPIme (MAXIPIME) 2 g in sodium chloride 0.9 % 100 mL IVPB        2 g 200 mL/hr over 30 Minutes Intravenous  Once 03/18/20 0751 03/18/20 0953   03/17/20 1200  vancomycin (VANCOCIN) IVPB 750 mg/150 ml premix        750 mg 150 mL/hr over 60 Minutes Intravenous Every T-Th-Sa (Hemodialysis) 03/14/20 1027 03/19/20 1158   03/14/20 1200  vancomycin (VANCOCIN) IVPB 1000 mg/200 mL premix        1,000 mg 200 mL/hr over 60 Minutes Intravenous  Once 03/14/20 1027 03/14/20 1641   03/14/20 1200  ceFEPIme (MAXIPIME) 2 g in sodium chloride 0.9 % 100 mL IVPB        2 g 200 mL/hr over 30 Minutes Intravenous Every T-Th-Sa (Hemodialysis) 03/14/20 1027 03/21/20 1159   03/14/20 0600  vancomycin (VANCOREADY) IVPB 1000 mg/200 mL        1,000 mg 200 mL/hr over 60 Minutes Intravenous To Short Stay 03/13/20 0712 03/13/20 0954   03/09/20 0600  vancomycin (VANCOREADY) IVPB 1000 mg/200 mL        1,000 mg 200 mL/hr over 60 Minutes Intravenous On call 03/06/20 1647 03/09/20 1542   03/06/20 1200  vancomycin (VANCOCIN) IVPB 750 mg/150 ml  premix  Status:  Discontinued        750 mg 150 mL/hr over 60 Minutes Intravenous  Once 03/05/20 1426 03/05/20 1447   03/05/20 1800  vancomycin (VANCOCIN) IVPB 750 mg/150 ml premix  Status:  Discontinued        750 mg 150 mL/hr over 60 Minutes Intravenous  Once 03/05/20 0831 03/05/20 0834   03/05/20 0930  vancomycin (VANCOREADY) IVPB 1500 mg/300 mL  Status:  Discontinued        1,500 mg 150 mL/hr over 120 Minutes Intravenous  Once 03/05/20 0831 03/05/20 1447   03/05/20 0930  ceFEPIme (MAXIPIME) 1 g in sodium chloride 0.9 % 100 mL IVPB  Status:  Discontinued        1 g 200 mL/hr over 30 Minutes Intravenous Every 24 hours 03/05/20 0831 03/05/20 1447   02/29/20 1000  cefTRIAXone (ROCEPHIN) 2 g in sodium chloride 0.9 % 100 mL IVPB  Status:  Discontinued        2 g 200 mL/hr over 30 Minutes Intravenous Every 24 hours 02/29/20 0910 03/05/20 0756       Subjective: Patient denies any new complaints, denies any chest pain, shortness of breath, abdominal pain, nausea/vomiting, fever/chills.   Objective: Vitals:   03/31/20 1214 03/31/20 1951 04/01/20 0401 04/01/20 1619  BP: 105/66 117/80 106/66 101/67  Pulse: (!) 107 (!) 110 81 84  Resp: 17 18 16 15   Temp: 98.3 F (36.8 C) 99.4 F (37.4 C) (!) 97.5 F (36.4 C) 98.7 F (37.1 C)  TempSrc: Oral Oral Oral Oral  SpO2:  100% 99% 100%  Weight:      Height:  Intake/Output Summary (Last 24 hours) at 04/01/2020 1629 Last data filed at 04/01/2020 0900 Gross per 24 hour  Intake 477 ml  Output --  Net 477 ml   Filed Weights   03/28/20 1244 03/31/20 0745 03/31/20 1130  Weight: 57 kg 60.3 kg 60.1 kg    Examination:  General: NAD   Cardiovascular: S1, S2 present  Respiratory: CTAB  Abdomen: Soft, nontender, nondistended, bowel sounds present  Musculoskeletal: Trace bilateral pedal edema noted  Skin: Normal  Psychiatry: Normal mood   Data Reviewed: I have personally reviewed following labs and imaging  studies  CBC: Recent Labs  Lab 03/26/20 0115 03/28/20 0851 03/31/20 0651 04/01/20 0342  WBC 8.3 7.0 6.4 6.8  NEUTROABS 6.2  --   --  4.6  HGB 9.3* 9.2* 9.0* 8.5*  HCT 28.3* 29.5* 28.7* 26.0*  MCV 90.7 94.6 94.1 93.2  PLT 189 165 143* 833*   Basic Metabolic Panel: Recent Labs  Lab 03/26/20 0115 03/28/20 0851 03/30/20 0411 03/31/20 0651 03/31/20 1120 04/01/20 0342  NA 132* 132*  --  131*  --  135  K 4.0 4.0  --  4.7  --  4.7  CL 97* 97*  --  95*  --  99  CO2 25 26  --  26  --  28  GLUCOSE 95 101*  --  87  --  89  BUN 35* 32*  --  46* 7* 24*  CREATININE 7.62* 7.60*  --  9.09*  --  5.64*  CALCIUM 8.4* 8.5* 8.3* 8.6*  --  8.6*  PHOS  --  3.4  --  4.6  --   --    GFR: Estimated Creatinine Clearance: 8.4 mL/min (A) (by C-G formula based on SCr of 5.64 mg/dL (H)). Liver Function Tests: Recent Labs  Lab 03/28/20 0851 03/31/20 0651  ALBUMIN 2.3* 2.3*   No results for input(s): LIPASE, AMYLASE in the last 168 hours. No results for input(s): AMMONIA in the last 168 hours. Coagulation Profile: No results for input(s): INR, PROTIME in the last 168 hours. Cardiac Enzymes: No results for input(s): CKTOTAL, CKMB, CKMBINDEX, TROPONINI in the last 168 hours. BNP (last 3 results) No results for input(s): PROBNP in the last 8760 hours. HbA1C: No results for input(s): HGBA1C in the last 72 hours. CBG: Recent Labs  Lab 03/30/20 1526 03/30/20 1944 04/01/20 0751 04/01/20 1213 04/01/20 1616  GLUCAP 120* 129* 86 102* 125*   Lipid Profile: No results for input(s): CHOL, HDL, LDLCALC, TRIG, CHOLHDL, LDLDIRECT in the last 72 hours. Thyroid Function Tests: No results for input(s): TSH, T4TOTAL, FREET4, T3FREE, THYROIDAB in the last 72 hours. Anemia Panel: No results for input(s): VITAMINB12, FOLATE, FERRITIN, TIBC, IRON, RETICCTPCT in the last 72 hours. Sepsis Labs: No results for input(s): PROCALCITON, LATICACIDVEN in the last 168 hours.  No results found for this or any  previous visit (from the past 240 hour(s)).       Radiology Studies: No results found.      Scheduled Meds: . chlorhexidine gluconate (MEDLINE KIT)  15 mL Mouth Rinse BID  . darbepoetin (ARANESP) injection - DIALYSIS  100 mcg Intravenous Q Tue-HD  . docusate sodium  100 mg Oral BID  . feeding supplement  237 mL Oral TID BM  . levETIRAcetam  250 mg Oral Q T,Th,Sa-HD  . levETIRAcetam  500 mg Oral Daily  . mouth rinse  15 mL Mouth Rinse BID  . metoprolol tartrate  75 mg Oral BID  . multivitamin  1  tablet Oral QHS  . NIFEdipine  30 mg Oral Daily  . predniSONE  10 mg Oral Q breakfast   Continuous Infusions: . sodium chloride 10 mL/hr at 03/14/20 1441          Alma Friendly, MD [facility] 04/01/2020, 4:29 PM

## 2020-04-02 LAB — CBC WITH DIFFERENTIAL/PLATELET
Abs Immature Granulocytes: 0.09 10*3/uL — ABNORMAL HIGH (ref 0.00–0.07)
Basophils Absolute: 0 10*3/uL (ref 0.0–0.1)
Basophils Relative: 1 %
Eosinophils Absolute: 0.6 10*3/uL — ABNORMAL HIGH (ref 0.0–0.5)
Eosinophils Relative: 8 %
HCT: 28.1 % — ABNORMAL LOW (ref 36.0–46.0)
Hemoglobin: 8.6 g/dL — ABNORMAL LOW (ref 12.0–15.0)
Immature Granulocytes: 1 %
Lymphocytes Relative: 17 %
Lymphs Abs: 1.3 10*3/uL (ref 0.7–4.0)
MCH: 29.9 pg (ref 26.0–34.0)
MCHC: 30.6 g/dL (ref 30.0–36.0)
MCV: 97.6 fL (ref 80.0–100.0)
Monocytes Absolute: 0.8 10*3/uL (ref 0.1–1.0)
Monocytes Relative: 10 %
Neutro Abs: 4.9 10*3/uL (ref 1.7–7.7)
Neutrophils Relative %: 63 %
Platelets: 150 10*3/uL (ref 150–400)
RBC: 2.88 MIL/uL — ABNORMAL LOW (ref 3.87–5.11)
RDW: 17 % — ABNORMAL HIGH (ref 11.5–15.5)
WBC: 7.8 10*3/uL (ref 4.0–10.5)
nRBC: 0.3 % — ABNORMAL HIGH (ref 0.0–0.2)

## 2020-04-02 LAB — RENAL FUNCTION PANEL
Albumin: 2.4 g/dL — ABNORMAL LOW (ref 3.5–5.0)
Anion gap: 10 (ref 5–15)
BUN: 42 mg/dL — ABNORMAL HIGH (ref 8–23)
CO2: 26 mmol/L (ref 22–32)
Calcium: 8.8 mg/dL — ABNORMAL LOW (ref 8.9–10.3)
Chloride: 98 mmol/L (ref 98–111)
Creatinine, Ser: 7.98 mg/dL — ABNORMAL HIGH (ref 0.44–1.00)
GFR, Estimated: 5 mL/min — ABNORMAL LOW (ref >60)
Glucose, Bld: 101 mg/dL — ABNORMAL HIGH (ref 70–99)
Phosphorus: 3 mg/dL (ref 2.5–4.6)
Potassium: 4.9 mmol/L (ref 3.5–5.1)
Sodium: 134 mmol/L — ABNORMAL LOW (ref 135–145)

## 2020-04-02 LAB — BUN: BUN: 8 mg/dL (ref 8–23)

## 2020-04-02 LAB — GLUCOSE, CAPILLARY: Glucose-Capillary: 87 mg/dL (ref 70–99)

## 2020-04-02 NOTE — Plan of Care (Signed)
  Problem: Clinical Measurements: Goal: Respiratory complications will improve Outcome: Progressing   Problem: Clinical Measurements: Goal: Cardiovascular complication will be avoided Outcome: Progressing   Problem: Coping: Goal: Level of anxiety will decrease Outcome: Progressing   Problem: Clinical Measurements: Goal: Dialysis access will remain free of complications Outcome: Progressing

## 2020-04-02 NOTE — TOC Progression Note (Addendum)
Transition of Care [facility]) - Progression Note    Patient Details  Name: Barbara Crawford MRN: [id number] Date of Birth: 1955-08-19  Transition of Care [facility]) CM/SW Contact  Joanne Chars, LCSW Phone Number: 04/02/2020, 11:23 AM  Clinical Narrative: CSW spoke with pt as change of insurance should take place tomorrow and hopefully DC is getting closer.  Pt reports she is still planning to go home and both daughter and son are still planning to assist once she gets there. Her walker and 3n1 came to her hospital room some time ago and her daughter took them home already.   CSW confirmed with [facility] and they are still planning to provide [facility], need updated [place] orders.        Expected Discharge Plan: [place] Barriers to Discharge: Continued Medical Work up  Expected Discharge Plan and Services Expected Discharge Plan: [place]: [place] arrangements for the past 2 months: Apartment                           HH Arranged: PT,OT [place]: [facility] [facility] [employer] Date [place]: 03/09/20 Time [place]: 1100 Representative spoke with at [place]: [place]   Social Determinants of Health ([place]) Interventions    Readmission Risk Interventions No flowsheet data found.

## 2020-04-02 NOTE — Progress Notes (Signed)
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  Continue dialysis on TTS schedule with process underway for outpatient dialysis unit placement (this has been difficult due to the unique insurance coverage that the patient has, this hopefully will be resolved very soon). -lue bc avf 3/11 clotted, VVS aware; appreciate assistance --> planning AVG outpt in near future -Cont HD via RIJ TDC -URR 84% on 3.5h tx, 3.25h next treatment today  Anemia: Likely from underlying chronic disease and surgical losses.  Ferritin level very high. Continue Aranesp and transfuse as needed. increased dose to 172mg qtuesdays  Hypertension/Volume: Euvolemic, minimal UOP.  UF to manage volume  Secondary hyperparathyroidism: Calcium and phosphorus under acceptable control, not on binders at this time.  PTH level was 193 on 2/23-at goal for ESRD.  Hyponatremia: stable and mild. Managed with dialysis, continue fluid restriction.  Dispo pending acceptance at outpt HD unit which has been challenging related to insurance issues.  Subjective: Seen on way to dialysis. No overnight issues..  Awaits dispo.  Objective Vital signs in last 24 hours: Vitals:   03/31/20 1951 04/01/20 0401 04/01/20 1619 04/02/20 0008  BP: 117/80 106/66 101/67 127/74  Pulse: (!) 110 81 84 93  Resp: 18 16 15 20   Temp: 99.4 F (37.4 C) (!) 97.5 F (36.4 C) 98.7 F (37.1 C)   TempSrc: Oral Oral Oral   SpO2: 100% 99% 100% 99%  Weight:      Height:       Weight change:   Intake/Output Summary (Last 24 hours) at 04/02/2020 0707 Last data filed at 04/01/2020 0900 Gross per 24 hour  Intake 240 ml  Output --  Net 240 ml       Labs: Basic Metabolic Panel: Recent Labs  Lab 03/28/20 0851 03/30/20 0411  03/31/20 0651 03/31/20 1120 04/01/20 0342 04/02/20 0154  NA 132*  --  131*  --  135 134*  K 4.0  --  4.7  --  4.7 4.9  CL 97*  --  95*  --  99 98  CO2 26  --  26  --  28 26  GLUCOSE 101*  --  87  --  89 101*  BUN 32*  --  46* 7* 24* 42*  CREATININE 7.60*  --  9.09*  --  5.64* 7.98*  CALCIUM 8.5*   < > 8.6*  --  8.6* 8.8*  PHOS 3.4  --  4.6  --   --  3.0   < > = values in this interval not displayed.   Liver Function Tests: Recent Labs  Lab 03/28/20 0851 03/31/20 0651 04/02/20 0154  ALBUMIN 2.3* 2.3* 2.4*   No results for input(s): LIPASE, AMYLASE in the last 168 hours. No results for input(s): AMMONIA in the last 168 hours. CBC: Recent Labs  Lab 03/28/20 0851 03/31/20 0651 04/01/20 0342 04/02/20 0154  WBC 7.0 6.4 6.8 7.8  NEUTROABS  --   --  4.6 4.9  HGB 9.2* 9.0* 8.5* 8.6*  HCT 29.5* 28.7* 26.0* 28.1*  MCV 94.6 94.1 93.2 97.6  PLT 165 143* 132* 150   Cardiac Enzymes: No results for input(s): CKTOTAL, CKMB, CKMBINDEX, TROPONINI in the last 168 hours. CBG: Recent Labs  Lab 04/01/20 0751 04/01/20 1213 04/01/20 1616 04/01/20 1956 04/02/20 0629  GLUCAP 86 102* 125* 118* 87  Iron Studies: No results for input(s): IRON, TIBC, TRANSFERRIN, FERRITIN in the last 72 hours. Studies/Results: No results found.  Medications: Infusions: . sodium chloride 10 mL/hr at 03/14/20 1441    Scheduled Medications: . chlorhexidine gluconate (MEDLINE KIT)  15 mL Mouth Rinse BID  . darbepoetin (ARANESP) injection - DIALYSIS  100 mcg Intravenous Q Tue-HD  . docusate sodium  100 mg Oral BID  . feeding supplement  237 mL Oral TID BM  . heparin injection (subcutaneous)  5,000 Units Subcutaneous Q8H  . levETIRAcetam  250 mg Oral Q T,Th,Sa-HD  . levETIRAcetam  500 mg Oral Daily  . mouth rinse  15 mL Mouth Rinse BID  . metoprolol tartrate  75 mg Oral BID  . multivitamin  1 tablet Oral QHS  . NIFEdipine  30 mg Oral Daily  . predniSONE  10 mg Oral Q breakfast    have  reviewed scheduled and prn medications.  Physical Exam: General:NAD, laying flat in bed Heart:RRR, s1s2 nl Lungs:clear b/l, no w/r/r/c, unlabored, bl chest expansion Abdomen:soft, Non-tender, non-distended Extremities: no edema bl LE's Dialysis Access: Right IJ TDC, site clean. LUE avf no thrill or bruit   Justin Mend 04/02/2020,7:07 AM  LOS: 37 days

## 2020-04-02 NOTE — Plan of Care (Signed)

## 2020-04-02 NOTE — Progress Notes (Signed)
Renal Navigator had not heard from [employer], so called Clinic Manager again. Previously, when Navigator had called Clinic Manager for assistance with getting this patient accepted to the clinic, she had stated that calls needed to go through Admissions. Navigator explained today that this patient has been medically stable for discharge for 3 weeks and that this is the closest clinic to her home who accepts her insurance. Navigator does not know how to assist this patient and needs [place] manager or someone to intervene. Clinic Manager made a few phone calls and got right back to Navigator to say that patient has been medically accepted by Dr. Joseph Berkshire and can start in the clinic on Tuesday, 04/07/20. Her schedule is TTS at 11:45am. She needs to arrive at 11:25am to her appointments.  Navigator sent message to [facility] to provide HD schedule and request that transportation commence on 4/5. Navigator awaits confirmation. Navigator, RN HD Coordinator and TOC CSW met with patient at bedside to provide good news to her that she has an outpatient HD schedule and finally has a discharge plan. She cried happy tears.  She will need to have inpatient HD here Saturday, 04/04/20. Navigator will request 1st shift to accommodate a quicker discharge. [facility] requires a PCR COVID test 72 hours prior to admission Tuesday, and therefore, patient needs to be swabbed Saturday prior to discharge. Navigator asked TOC CSW to ensure this be completed. Navigator will send results to clinic on Monday.  Clinic MD is also requesting a copy of the kidney biopsy results-Navigator contacted [place] and had them faxed directly to [facility]. He requests outpatient HD orders be faxed to the clinic. Navigator will discuss with Renal PA tomorrow. He wants patient's outpatient VVS surgery for graft to be scheduled prior to her starting  in the clinic. Navigator greatly appreciates the assistance of VVS PA/M. Eveland to help arrange.  Navigator will follow up tomorrow prior to planned discharge Saturday.   Alphonzo Cruise, [place] Renal Navigator [phone]

## 2020-04-02 NOTE — Progress Notes (Signed)
Patient ID: Barbara Crawford, female   DOB: 12/28/1955, 65 y.o.   MRN: [id number]  PROGRESS NOTE    Joselyne Spake  OLM:[id number] DOB: 1955-01-28 DOA: 02/25/2020 PCP: Vonna Drafts, FNP   Brief Narrative:  65 yo female with past medical history of hypertension, prior Covid (Dec 2021) presented to the hospital with complaints of lower extremity swelling, back pain, generalized malaise, fatigue at home. In the ED,she was found to have a hemoglobin of 6.3, potassium 5.4, sodium 130, bicarb 9, BUN 144, and creatinine 29.98. CT of the abdomen and pelvis was negative for obstruction or acute abnormality. Temporary hemodialysis catheter was placed by IR and subsequently, the patient had a PEA arrest associated with seizure-like activity. She was intubated and subsequently extubated on 02/28/2020. 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 Renal biopsy was done 03/03/2019. ANA positive. ID followed the patient for fever. Dialysis cath was removed on 03/06/20 and subsequently underwent permacath placement. During the hospitalization, patient continued to have fever so ID wasreconsulted.Extensive work-up was done. Recent x-ray had shown consolidation in the lungs. Patient was started on vancomycin and cefepime for possible healthcare associated pneumonia and subsequentlycompleted the course of antibiotic.The thought process was that there was possibility of autoimmune disease causing kidney disease and fever. She was treated with prednisone with improvement in fever. Currently awaiting for outpatient hemodialysis arrangement.  She was noted to have slightly lower hemoglobin level on 03/24/20 requiring 1 unit PRBC transfusion.  No overt bleeding identified. She is currently medically stable for discharge.    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  Vascular surgery was reconsulted: They are planning for further intervention as an outpatient. Nephrology is following and dialysis as per nephrology schedule. Outpatient hemodialysis unit arrangement has been made  Fever/possible healthcare associated pneumonia Remains afebrile in the last 24H Possible autoimmune etiology of fever Completed course of vancomycin and cefepime Blood/urine cultures have been negative so far Duplex ultrasound was negative for DVT. Continue incentive spirometry CT scan of the abdomen pelvis with evolving left perinephric hematoma. CT scan of the chest was performed which showed mediastinal and axillary lymph nodes with decreasing bilateral pleural effusion. Continue tapering doses of prednisone  Anemia of chronic disease Hemoglobin stable Status post 3 units of packed red cells transfusion during this hospitalization; last transfusion was on 03/24/2020 for hemoglobin of 6.9  Seizure Patient had seizure with loss of pulse and PEA arrest status post CPR Patient was seen by neurology during his hospitalization. No further seizures while inpatient.  Continue current doses of Keppra.  Outpatient follow-up with neurology  Rash Patient developed rash on 03/25/2020 after receiving CHG bath with wipes: Mostly over the thighs and arm.  CHG bath and wipes have been discontinued.  Monitor.  Use loratadine/antiallergic cream for itching  Essential hypertension Blood pressure stable Continue nifedipine, metoprolol  Acute on chronic respiratory failure with hypoxia Resolved.  Extubated on 02/28/2020.  Currently on room air  Cardiac arrest with pulseless electrical  activity Status post CPR and intubation.  Stable at this time  Debility/deconditioning Home health PT on discharge    DVT prophylaxis: Heparin started on 04/01/20 Code Status: Full Family  Communication: None at bedside Disposition Plan: Status is: Inpatient  Remains inpatient appropriate because:Inpatient level of care appropriate due to severity of illness.     Dispo:  Patient From: Home  Planned Disposition: Home with home health on 04/04/20  Medically stable for discharge: Yes   Consultants: PCCM/nephrology/IR/ID/vascular surgery/neurology  Procedures: Intubation/extubation Renal biopsy IR guided tunneled dialysis catheter placement Left brachiocephalic fistula creation on 03/13/2020  Antimicrobials:  Anti-infectives (From admission, onward)   Start     Dose/Rate Route Frequency Ordered Stop   03/18/20 0900  ceFEPIme (MAXIPIME) 2 g in sodium chloride 0.9 % 100 mL IVPB        2 g 200 mL/hr over 30 Minutes Intravenous  Once 03/18/20 0751 03/18/20 0953   03/17/20 1200  vancomycin (VANCOCIN) IVPB 750 mg/150 ml premix        750 mg 150 mL/hr over 60 Minutes Intravenous Every T-Th-Sa (Hemodialysis) 03/14/20 1027 03/19/20 1158   03/14/20 1200  vancomycin (VANCOCIN) IVPB 1000 mg/200 mL premix        1,000 mg 200 mL/hr over 60 Minutes Intravenous  Once 03/14/20 1027 03/14/20 1641   03/14/20 1200  ceFEPIme (MAXIPIME) 2 g in sodium chloride 0.9 % 100 mL IVPB        2 g 200 mL/hr over 30 Minutes Intravenous Every T-Th-Sa (Hemodialysis) 03/14/20 1027 03/21/20 1159   03/14/20 0600  vancomycin (VANCOREADY) IVPB 1000 mg/200 mL        1,000 mg 200 mL/hr over 60 Minutes Intravenous To Short Stay 03/13/20 0712 03/13/20 0954   03/09/20 0600  vancomycin (VANCOREADY) IVPB 1000 mg/200 mL        1,000 mg 200 mL/hr over 60 Minutes Intravenous On call 03/06/20 1647 03/09/20 1542   03/06/20 1200  vancomycin (VANCOCIN) IVPB 750 mg/150 ml premix  Status:  Discontinued        750 mg 150 mL/hr  over 60 Minutes Intravenous  Once 03/05/20 1426 03/05/20 1447   03/05/20 1800  vancomycin (VANCOCIN) IVPB 750 mg/150 ml premix  Status:  Discontinued        750 mg 150 mL/hr over 60 Minutes Intravenous  Once 03/05/20 0831 03/05/20 0834   03/05/20 0930  vancomycin (VANCOREADY) IVPB 1500 mg/300 mL  Status:  Discontinued        1,500 mg 150 mL/hr over 120 Minutes Intravenous  Once 03/05/20 0831 03/05/20 1447   03/05/20 0930  ceFEPIme (MAXIPIME) 1 g in sodium chloride 0.9 % 100 mL IVPB  Status:  Discontinued        1 g 200 mL/hr over 30 Minutes Intravenous Every 24 hours 03/05/20 0831 03/05/20 1447   02/29/20 1000  cefTRIAXone (ROCEPHIN) 2 g in sodium chloride 0.9 % 100 mL IVPB  Status:  Discontinued        2 g 200 mL/hr over 30 Minutes Intravenous Every 24 hours 02/29/20 0910 03/05/20 0756       Subjective: Patient denies any new complaints   Objective: Vitals:   04/02/20 1000 04/02/20 1030 04/02/20 1045 04/02/20 1108  BP: 122/69 133/78 132/73 119/64  Pulse: 88 83 84   Resp:   (!) 21 19  Temp:   97.7 F (36.5 C) (!) 97 F (36.1 C)  TempSrc:   Oral Oral  SpO2:   100% 100%  Weight:   59.6 kg   Height:        Intake/Output Summary (Last 24 hours) at 04/02/2020 1609 Last data filed at 04/02/2020 1032 Gross per 24 hour  Intake --  Output 1137 ml  Net -1137 ml   Filed Weights   03/31/20 1130 04/02/20 0706 04/02/20 1045  Weight: 60.1 kg 60.6 kg 59.6 kg    Examination:  General: NAD   Cardiovascular: S1, S2 present  Respiratory: CTAB  Abdomen: Soft, nontender, nondistended, bowel sounds present  Musculoskeletal: No bilateral pedal edema noted  Skin: Normal  Psychiatry: Normal mood   Data Reviewed: I have personally reviewed following labs and imaging studies  CBC: Recent Labs  Lab 03/28/20 0851 03/31/20 0651 04/01/20 0342 04/02/20 0154  WBC 7.0 6.4 6.8 7.8  NEUTROABS  --   --  4.6 4.9  HGB 9.2* 9.0* 8.5* 8.6*  HCT 29.5* 28.7* 26.0* 28.1*  MCV 94.6 94.1  93.2 97.6  PLT 165 143* 132* 914   Basic Metabolic Panel: Recent Labs  Lab 03/28/20 0851 03/30/20 0411 03/31/20 0651 03/31/20 1120 04/01/20 0342 04/02/20 0154 04/02/20 1125  NA 132*  --  131*  --  135 134*  --   K 4.0  --  4.7  --  4.7 4.9  --   CL 97*  --  95*  --  99 98  --   CO2 26  --  26  --  28 26  --   GLUCOSE 101*  --  87  --  89 101*  --   BUN 32*  --  46* 7* 24* 42* 8  CREATININE 7.60*  --  9.09*  --  5.64* 7.98*  --   CALCIUM 8.5* 8.3* 8.6*  --  8.6* 8.8*  --   PHOS 3.4  --  4.6  --   --  3.0  --    GFR: Estimated Creatinine Clearance: 6 mL/min (A) (by C-G formula based on SCr of 7.98 mg/dL (H)). Liver Function Tests: Recent Labs  Lab 03/28/20 0851 03/31/20 0651 04/02/20 0154  ALBUMIN 2.3* 2.3* 2.4*   No results for input(s): LIPASE, AMYLASE in the last 168 hours. No results for input(s): AMMONIA in the last 168 hours. Coagulation Profile: No results for input(s): INR, PROTIME in the last 168 hours. Cardiac Enzymes: No results for input(s): CKTOTAL, CKMB, CKMBINDEX, TROPONINI in the last 168 hours. BNP (last 3 results) No results for input(s): PROBNP in the last 8760 hours. HbA1C: No results for input(s): HGBA1C in the last 72 hours. CBG: Recent Labs  Lab 04/01/20 0751 04/01/20 1213 04/01/20 1616 04/01/20 1956 04/02/20 0629  GLUCAP 86 102* 125* 118* 87   Lipid Profile: No results for input(s): CHOL, HDL, LDLCALC, TRIG, CHOLHDL, LDLDIRECT in the last 72 hours. Thyroid Function Tests: No results for input(s): TSH, T4TOTAL, FREET4, T3FREE, THYROIDAB in the last 72 hours. Anemia Panel: No results for input(s): VITAMINB12, FOLATE, FERRITIN, TIBC, IRON, RETICCTPCT in the last 72 hours. Sepsis Labs: No results for input(s): PROCALCITON, LATICACIDVEN in the last 168 hours.  No results found for this or any previous visit (from the past 240 hour(s)).       Radiology Studies: No results found.      Scheduled Meds: . chlorhexidine gluconate  (MEDLINE KIT)  15 mL Mouth Rinse BID  . darbepoetin (ARANESP) injection - DIALYSIS  100 mcg Intravenous Q Tue-HD  . docusate sodium  100 mg Oral BID  . feeding supplement  237 mL Oral TID BM  . heparin injection (subcutaneous)  5,000 Units Subcutaneous Q8H  . levETIRAcetam  250 mg Oral Q T,Th,Sa-HD  . levETIRAcetam  500 mg Oral Daily  . mouth rinse  15 mL Mouth  Rinse BID  . metoprolol tartrate  75 mg Oral BID  . multivitamin  1 tablet Oral QHS  . NIFEdipine  30 mg Oral Daily  . predniSONE  10 mg Oral Q breakfast   Continuous Infusions: . sodium chloride 10 mL/hr at 03/14/20 1441          Alma Friendly, MD [facility] 04/02/2020, 4:09 PM

## 2020-04-03 LAB — CBC WITH DIFFERENTIAL/PLATELET
Abs Immature Granulocytes: 0.11 10*3/uL — ABNORMAL HIGH (ref 0.00–0.07)
Basophils Absolute: 0.1 10*3/uL (ref 0.0–0.1)
Basophils Relative: 1 %
Eosinophils Absolute: 0.7 10*3/uL — ABNORMAL HIGH (ref 0.0–0.5)
Eosinophils Relative: 10 %
HCT: 27.5 % — ABNORMAL LOW (ref 36.0–46.0)
Hemoglobin: 8.6 g/dL — ABNORMAL LOW (ref 12.0–15.0)
Immature Granulocytes: 2 %
Lymphocytes Relative: 18 %
Lymphs Abs: 1.2 10*3/uL (ref 0.7–4.0)
MCH: 30.5 pg (ref 26.0–34.0)
MCHC: 31.3 g/dL (ref 30.0–36.0)
MCV: 97.5 fL (ref 80.0–100.0)
Monocytes Absolute: 1 10*3/uL (ref 0.1–1.0)
Monocytes Relative: 14 %
Neutro Abs: 3.9 10*3/uL (ref 1.7–7.7)
Neutrophils Relative %: 55 %
Platelets: 131 10*3/uL — ABNORMAL LOW (ref 150–400)
RBC: 2.82 MIL/uL — ABNORMAL LOW (ref 3.87–5.11)
RDW: 17.2 % — ABNORMAL HIGH (ref 11.5–15.5)
WBC: 6.9 10*3/uL (ref 4.0–10.5)
nRBC: 0.6 % — ABNORMAL HIGH (ref 0.0–0.2)

## 2020-04-03 LAB — RENAL FUNCTION PANEL
Albumin: 2.4 g/dL — ABNORMAL LOW (ref 3.5–5.0)
Anion gap: 8 (ref 5–15)
BUN: 26 mg/dL — ABNORMAL HIGH (ref 8–23)
CO2: 29 mmol/L (ref 22–32)
Calcium: 8.8 mg/dL — ABNORMAL LOW (ref 8.9–10.3)
Chloride: 98 mmol/L (ref 98–111)
Creatinine, Ser: 5.51 mg/dL — ABNORMAL HIGH (ref 0.44–1.00)
GFR, Estimated: 8 mL/min — ABNORMAL LOW (ref >60)
Glucose, Bld: 82 mg/dL (ref 70–99)
Phosphorus: 2.8 mg/dL (ref 2.5–4.6)
Potassium: 4.8 mmol/L (ref 3.5–5.1)
Sodium: 135 mmol/L (ref 135–145)

## 2020-04-03 NOTE — Progress Notes (Signed)
Patient ID: Barbara Crawford, female   DOB: 12/28/1955, 65 y.o.   MRN: [id number]  PROGRESS NOTE    Barbara Crawford  OLM:[id number] DOB: 1955-01-28 DOA: 02/25/2020 PCP: Vonna Drafts, FNP   Brief Narrative:  65 yo female with past medical history of hypertension, prior Covid (Dec 2021) presented to the hospital with complaints of lower extremity swelling, back pain, generalized malaise, fatigue at home. In the ED,she was found to have a hemoglobin of 6.3, potassium 5.4, sodium 130, bicarb 9, BUN 144, and creatinine 29.98. CT of the abdomen and pelvis was negative for obstruction or acute abnormality. Temporary hemodialysis catheter was placed by IR and subsequently, the patient had a PEA arrest associated with seizure-like activity. She was intubated and subsequently extubated on 02/28/2020. 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 Renal biopsy was done 03/03/2019. ANA positive. ID followed the patient for fever. Dialysis cath was removed on 03/06/20 and subsequently underwent permacath placement. During the hospitalization, patient continued to have fever so ID wasreconsulted.Extensive work-up was done. Recent x-ray had shown consolidation in the lungs. Patient was started on vancomycin and cefepime for possible healthcare associated pneumonia and subsequentlycompleted the course of antibiotic.The thought process was that there was possibility of autoimmune disease causing kidney disease and fever. She was treated with prednisone with improvement in fever. Currently awaiting for outpatient hemodialysis arrangement.  She was noted to have slightly lower hemoglobin level on 03/24/20 requiring 1 unit PRBC transfusion.  No overt bleeding identified. She is currently medically stable for discharge.    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  Vascular surgery was reconsulted: They are planning for further intervention as an outpatient. Nephrology is following and dialysis as per nephrology schedule. Outpatient hemodialysis unit arrangement has been made  Fever/possible healthcare associated pneumonia Remains afebrile in the last 24H Possible autoimmune etiology of fever Completed course of vancomycin and cefepime Blood/urine cultures have been negative so far Duplex ultrasound was negative for DVT. Continue incentive spirometry CT scan of the abdomen pelvis with evolving left perinephric hematoma. CT scan of the chest was performed which showed mediastinal and axillary lymph nodes with decreasing bilateral pleural effusion. Continue tapering doses of prednisone  Anemia of chronic disease Hemoglobin stable Status post 3 units of packed red cells transfusion during this hospitalization; last transfusion was on 03/24/2020 for hemoglobin of 6.9  Seizure Patient had seizure with loss of pulse and PEA arrest status post CPR Patient was seen by neurology during his hospitalization. No further seizures while inpatient.  Continue current doses of Keppra.  Outpatient follow-up with neurology  Rash Patient developed rash on 03/25/2020 after receiving CHG bath with wipes: Mostly over the thighs and arm.  CHG bath and wipes have been discontinued.  Monitor.  Use loratadine/antiallergic cream for itching  Essential hypertension Blood pressure stable Continue nifedipine, metoprolol  Acute on chronic respiratory failure with hypoxia Resolved.  Extubated on 02/28/2020.  Currently on room air  Cardiac arrest with pulseless electrical  activity Status post CPR and intubation.  Stable at this time  Debility/deconditioning Home health PT on discharge    DVT prophylaxis: Heparin started on 04/01/20 Code Status: Full Family  Communication: None at bedside Disposition Plan: Status is: Inpatient  Remains inpatient appropriate because:Inpatient level of care appropriate due to severity of illness.     Dispo:  Patient From: Home  Planned Disposition: Home with home health on 04/04/20  Medically stable for discharge: Yes   Consultants: PCCM/nephrology/IR/ID/vascular surgery/neurology  Procedures: Intubation/extubation Renal biopsy IR guided tunneled dialysis catheter placement Left brachiocephalic fistula creation on 03/13/2020  Antimicrobials:  Anti-infectives (From admission, onward)   Start     Dose/Rate Route Frequency Ordered Stop   03/18/20 0900  ceFEPIme (MAXIPIME) 2 g in sodium chloride 0.9 % 100 mL IVPB        2 g 200 mL/hr over 30 Minutes Intravenous  Once 03/18/20 0751 03/18/20 0953   03/17/20 1200  vancomycin (VANCOCIN) IVPB 750 mg/150 ml premix        750 mg 150 mL/hr over 60 Minutes Intravenous Every T-Th-Sa (Hemodialysis) 03/14/20 1027 03/19/20 1158   03/14/20 1200  vancomycin (VANCOCIN) IVPB 1000 mg/200 mL premix        1,000 mg 200 mL/hr over 60 Minutes Intravenous  Once 03/14/20 1027 03/14/20 1641   03/14/20 1200  ceFEPIme (MAXIPIME) 2 g in sodium chloride 0.9 % 100 mL IVPB        2 g 200 mL/hr over 30 Minutes Intravenous Every T-Th-Sa (Hemodialysis) 03/14/20 1027 03/21/20 1159   03/14/20 0600  vancomycin (VANCOREADY) IVPB 1000 mg/200 mL        1,000 mg 200 mL/hr over 60 Minutes Intravenous To Short Stay 03/13/20 0712 03/13/20 0954   03/09/20 0600  vancomycin (VANCOREADY) IVPB 1000 mg/200 mL        1,000 mg 200 mL/hr over 60 Minutes Intravenous On call 03/06/20 1647 03/09/20 1542   03/06/20 1200  vancomycin (VANCOCIN) IVPB 750 mg/150 ml premix  Status:  Discontinued        750 mg 150 mL/hr  over 60 Minutes Intravenous  Once 03/05/20 1426 03/05/20 1447   03/05/20 1800  vancomycin (VANCOCIN) IVPB 750 mg/150 ml premix  Status:  Discontinued        750 mg 150 mL/hr over 60 Minutes Intravenous  Once 03/05/20 0831 03/05/20 0834   03/05/20 0930  vancomycin (VANCOREADY) IVPB 1500 mg/300 mL  Status:  Discontinued        1,500 mg 150 mL/hr over 120 Minutes Intravenous  Once 03/05/20 0831 03/05/20 1447   03/05/20 0930  ceFEPIme (MAXIPIME) 1 g in sodium chloride 0.9 % 100 mL IVPB  Status:  Discontinued        1 g 200 mL/hr over 30 Minutes Intravenous Every 24 hours 03/05/20 0831 03/05/20 1447   02/29/20 1000  cefTRIAXone (ROCEPHIN) 2 g in sodium chloride 0.9 % 100 mL IVPB  Status:  Discontinued        2 g 200 mL/hr over 30 Minutes Intravenous Every 24 hours 02/29/20 0910 03/05/20 0756       Subjective: Patient denies any new complaints   Objective: Vitals:   04/02/20 1108 04/02/20 2010 04/03/20 0552 04/03/20 0700  BP: 119/64 123/68 132/80 109/72  Pulse:  90 84 89  Resp: '19 19 20   '$ Temp: (!) 97 F (36.1 C) 98 F (36.7 C) 98.7 F (37.1 C) 98.5 F (36.9 C)  TempSrc: Oral Oral Oral Oral  SpO2: 100% 99% 99% 100%  Weight:      Height:        Intake/Output Summary (Last 24 hours) at 04/03/2020 1701 Last data filed at 04/02/2020 2000 Gross per  24 hour  Intake 240 ml  Output --  Net 240 ml   Filed Weights   03/31/20 1130 04/02/20 0706 04/02/20 1045  Weight: 60.1 kg 60.6 kg 59.6 kg    Examination:  General: NAD   Cardiovascular: S1, S2 present  Respiratory: CTAB  Abdomen: Soft, nontender, nondistended, bowel sounds present  Musculoskeletal: No bilateral pedal edema noted  Skin: Normal  Psychiatry: Normal mood   Data Reviewed: I have personally reviewed following labs and imaging studies  CBC: Recent Labs  Lab 03/28/20 0851 03/31/20 0651 04/01/20 0342 04/02/20 0154 04/03/20 0132  WBC 7.0 6.4 6.8 7.8 6.9  NEUTROABS  --   --  4.6 4.9 3.9  HGB 9.2* 9.0*  8.5* 8.6* 8.6*  HCT 29.5* 28.7* 26.0* 28.1* 27.5*  MCV 94.6 94.1 93.2 97.6 97.5  PLT 165 143* 132* 150 [id number]*   Basic Metabolic Panel: Recent Labs  Lab 03/28/20 0851 03/30/20 0411 03/31/20 0651 03/31/20 1120 04/01/20 0342 04/02/20 0154 04/02/20 1125 04/03/20 0132  NA 132*  --  131*  --  135 134*  --  135  K 4.0  --  4.7  --  4.7 4.9  --  4.8  CL 97*  --  95*  --  99 98  --  98  CO2 26  --  26  --  28 26  --  29  GLUCOSE 101*  --  87  --  89 101*  --  82  BUN 32*  --  46* 7* 24* 42* 8 26*  CREATININE 7.60*  --  9.09*  --  5.64* 7.98*  --  5.51*  CALCIUM 8.5* 8.3* 8.6*  --  8.6* 8.8*  --  8.8*  PHOS 3.4  --  4.6  --   --  3.0  --  2.8   GFR: Estimated Creatinine Clearance: 8.6 mL/min (A) (by C-G formula based on SCr of 5.51 mg/dL (H)). Liver Function Tests: Recent Labs  Lab 03/28/20 0851 03/31/20 0651 04/02/20 0154 04/03/20 0132  ALBUMIN 2.3* 2.3* 2.4* 2.4*   No results for input(s): LIPASE, AMYLASE in the last 168 hours. No results for input(s): AMMONIA in the last 168 hours. Coagulation Profile: No results for input(s): INR, PROTIME in the last 168 hours. Cardiac Enzymes: No results for input(s): CKTOTAL, CKMB, CKMBINDEX, TROPONINI in the last 168 hours. BNP (last 3 results) No results for input(s): PROBNP in the last 8760 hours. HbA1C: No results for input(s): HGBA1C in the last 72 hours. CBG: Recent Labs  Lab 04/01/20 0751 04/01/20 1213 04/01/20 1616 04/01/20 1956 04/02/20 0629  GLUCAP 86 102* 125* 118* 87   Lipid Profile: No results for input(s): CHOL, HDL, LDLCALC, TRIG, CHOLHDL, LDLDIRECT in the last 72 hours. Thyroid Function Tests: No results for input(s): TSH, T4TOTAL, FREET4, T3FREE, THYROIDAB in the last 72 hours. Anemia Panel: No results for input(s): VITAMINB12, FOLATE, FERRITIN, TIBC, IRON, RETICCTPCT in the last 72 hours. Sepsis Labs: No results for input(s): PROCALCITON, LATICACIDVEN in the last 168 hours.  No results found for this or  any previous visit (from the past 240 hour(s)).       Radiology Studies: No results found.      Scheduled Meds: . darbepoetin (ARANESP) injection - DIALYSIS  100 mcg Intravenous Q Tue-HD  . docusate sodium  100 mg Oral BID  . feeding supplement  237 mL Oral TID BM  . heparin injection (subcutaneous)  5,000 Units Subcutaneous Q8H  . levETIRAcetam  250 mg Oral Q T,Th,Sa-HD  .  levETIRAcetam  500 mg Oral Daily  . metoprolol tartrate  75 mg Oral BID  . multivitamin  1 tablet Oral QHS  . NIFEdipine  30 mg Oral Daily  . predniSONE  10 mg Oral Q breakfast   Continuous Infusions: . sodium chloride 10 mL/hr at 03/14/20 1441          Alma Friendly, MD [facility] 04/03/2020, 5:01 PM

## 2020-04-03 NOTE — Progress Notes (Signed)
Fjinal round on patient this morning as she prepares for outpatient HD. Patient referred questions to the Renal Navigator, Colleen, in regards to her medication regimen. This RN rounded to answer specific questions. The patient inquired about blood pressure medications and regimine. Educated patient to ensure that she takes her lis to of medications to her nephrology appointment and her PCP appointment. Patient was educated on how she has received them in the hospital, but to ensure that she clears this regimen with her physicians. Patient has home support of both her son and daughter. No further questions at this time.  Dorthey Sawyer, RN  Dialysis Nurse Coordinator [phone]

## 2020-04-03 NOTE — Progress Notes (Signed)
Occupational Therapy Treatment Patient Details Name: Barbara Crawford MRN: [id number] DOB: February 02, 1955 Today's Date: 04/03/2020    History of present illness 65 y.o. female who reported to ED with complaints of B LE swelling and LBP. CT abd/pelvis - no obstruction or acute abnormality. HGB 6.3. Pt admitted on 02/25/20 and diagnosed with acute renal faiulre. 2/23 pt had seizure and Code Blue with CPR x 4 minutes and intubated 2/23-2/25; CRRT; renal biopsy 2/28 with perinephric hematoma; pneumonia;  IJ catheter placed and HD initiated. Dialysis access placed 3/11. PMH includes HTN and COVID, which pt had 1 week prior to presenting to ED.   OT comments  Pt making good progress towards OT goals this session. Pt reports having already completed morning ADLs and declined UB therex d/t limited AROM in LUE from previous rotator cuff surgery, therefore session focus on progressing functional activity tolerance via functional mobility. Pt currently requires supervision - min guard assist for functional mobility greater than a household distance with RW, increased assist needed as pt fatigues. Pt continued to be limited by decreased activity tolerance with pt needing seated rest break at half way mark, VSS. Pt would continue to benefit from skilled occupational therapy while admitted and after d/c to address the below listed limitations in order to improve overall functional mobility and facilitate independence with BADL participation. DC plan remains appropriate, will follow acutely per POC.     Follow Up Recommendations  Home health OT    Equipment Recommendations  3 in 1 bedside commode    Recommendations for Other Services      Precautions / Restrictions Precautions Precautions: Fall Restrictions Weight Bearing Restrictions: No       Mobility Bed Mobility               General bed mobility comments: pt sitting EOB upon arrival    Transfers Overall transfer level: Needs  assistance Equipment used: Rolling walker (2 wheeled) Transfers: Sit to/from Stand Sit to Stand: Supervision         General transfer comment: supervision for safety    Balance Overall balance assessment: Needs assistance Sitting-balance support: Feet supported Sitting balance-Leahy Scale: Good Sitting balance - Comments: sitting EOB statically   Standing balance support: During functional activity;Bilateral upper extremity supported Standing balance-Leahy Scale: Fair Standing balance comment: BUE supported needed for functional mobility                           ADL either performed or assessed with clinical judgement   ADL Overall ADL's : Needs assistance/impaired       Grooming Details (indicate cue type and reason): pt declined grooming tasks at sink         Upper Body Dressing : Set up;Sitting Upper Body Dressing Details (indicate cue type and reason): to don posterior gown     Toilet Transfer: Min guard;Supervision/safety;RW;Ambulation Toilet Transfer Details (indicate cue type and reason): simulated via functional mobility greater than a houshold distance with RW and min guard assist - supervision         Functional mobility during ADLs: Supervision/safety;Rolling walker;Min guard General ADL Comments: pt continues to present with decreased activity tolerance, but able to progress functional mobiltiy to greater than a houshold distance as precursor to higher level BADLs     Vision       Perception     Praxis      Cognition Arousal/Alertness: Awake/alert Behavior During Therapy: [facility] for tasks assessed/performed  Overall Cognitive Status: Within Functional Limits for tasks assessed                                 General Comments: pt became emotional in hallway when RN congratulated pt on how far she had walked        Exercises     Shoulder Instructions       General Comments pt required one seated rest break during  functional mobility with sats 97% on RA and HR 95 bpm    Pertinent Vitals/ Pain       Pain Assessment: No/denies pain  Home Living                                          Prior Functioning/Environment              Frequency  Min 2X/week        Progress Toward Goals  OT Goals(current goals can now be found in the care plan section)  Progress towards OT goals: Progressing toward goals  Acute Rehab OT Goals Patient Stated Goal: none stated Time For Goal Achievement: 04/03/20 Potential to Achieve Goals: Good  Plan Discharge plan remains appropriate;Frequency remains appropriate    Co-evaluation                 AM-PAC OT "6 Clicks" Daily Activity     Outcome Measure   Help from another person eating meals?: None Help from another person taking care of personal grooming?: None Help from another person toileting, which includes using toliet, bedpan, or urinal?: A Little Help from another person bathing (including washing, rinsing, drying)?: A Little Help from another person to put on and taking off regular upper body clothing?: None Help from another person to put on and taking off regular lower body clothing?: None 6 Click Score: 22    End of Session Equipment Utilized During Treatment: Rolling walker;Gait belt  OT Visit Diagnosis: Unsteadiness on feet (R26.81);Muscle weakness (generalized) (M62.81)   Activity Tolerance Patient tolerated treatment well   Patient Left in chair;with call bell/phone within reach   Nurse Communication Mobility status        Time: [id number] OT Time Calculation (min): 21 min  Charges: OT General Charges $OT Visit: 1 Visit OT Treatments $Therapeutic Activity: 8-22 mins  Barbara Crawford., COTA/L Acute Rehabilitation Services [phone]    Barbara Crawford 04/03/2020, 11:29 AM

## 2020-04-03 NOTE — Progress Notes (Signed)
OT Cancellation Note  Patient Details Name: Barbara Crawford MRN: [id number] DOB: Dec 18, 1955   Cancelled Treatment:    Reason Eval/Treat Not Completed: Patient declined, no reason specified;Other (comment) pt request to finish breakfast prior to OT session, will check back as time allows.  Corinne Ports K., COTA/L Acute Rehabilitation Services [phone]   [name] 04/03/2020, 8:15 AM

## 2020-04-03 NOTE — Progress Notes (Signed)
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 . -lue bc avf 3/11 clotted, VVS aware; appreciate assistance --> planning AVG outpt in near future -Cont HD via RIJ TDC -URR 80% on 3h36mn tx, volume hasn't been an issue so keep 3h133m -Has outpt HD to start 4/5, d/c after HD tomorrow. Pt aware.  Anemia: Likely from underlying chronic disease and surgical losses.  Ferritin level very high. Continue Aranesp and transfuse as needed. increased dose to 10066mqtuesdays  Hypertension/Volume: Euvolemic, minimal UOP.  UF to manage volume  Secondary hyperparathyroidism: Calcium and phosphorus under acceptable control, not on binders at this time.  PTH level was 193 on 2/23-at goal for ESRD.  Hyponatremia: stable and mild. Managed with dialysis, continue fluid restriction.  Has outpt HD arranged now.  Plan d/c Tomorrow after HD.  Pt aware.  Subjective: Seen on way to dialysis. No overnight issues..  Awaits dispo.  Objective Vital signs in last 24 hours: Vitals:   04/02/20 1108 04/02/20 2010 04/03/20 0552 04/03/20 0700  BP: 119/64 123/68 132/80 109/72  Pulse:  90 84 89  Resp: '19 19 20   '$ Temp: (!) 97 F (36.1 C) 98 F (36.7 C) 98.7 F (37.1 C) 98.5 F (36.9 C)  TempSrc: Oral Oral Oral Oral  SpO2: 100% 99% 99% 100%  Weight:      Height:       Weight change:   Intake/Output Summary (Last 24 hours) at 04/03/2020 1111 Last data filed at 04/02/2020 2000 Gross per 24 hour  Intake 240 ml  Output --  Net 240 ml       Labs: Basic Metabolic Panel: Recent Labs  Lab 03/31/20 0651 03/31/20 1120 04/01/20 0342 04/02/20 0154 04/02/20 1125 04/03/20 0132  NA 131*  --  135 134*  --  135  K 4.7  --  4.7 4.9  --  4.8  CL 95*  --  99 98  --  98  CO2 26  --  28 26   --  29  GLUCOSE 87  --  89 101*  --  82  BUN 46*   < > 24* 42* 8 26*  CREATININE 9.09*  --  5.64* 7.98*  --  5.51*  CALCIUM 8.6*  --  8.6* 8.8*  --  8.8*  PHOS 4.6  --   --  3.0  --  2.8   < > = values in this interval not displayed.   Liver Function Tests: Recent Labs  Lab 03/31/20 0651 04/02/20 0154 04/03/20 0132  ALBUMIN 2.3* 2.4* 2.4*   No results for input(s): LIPASE, AMYLASE in the last 168 hours. No results for input(s): AMMONIA in the last 168 hours. CBC: Recent Labs  Lab 03/28/20 0851 03/31/20 0651 04/01/20 0342 04/02/20 0154 04/03/20 0132  WBC 7.0 6.4 6.8 7.8 6.9  NEUTROABS  --   --  4.6 4.9 3.9  HGB 9.2* 9.0* 8.5* 8.6* 8.6*  HCT 29.5* 28.7* 26.0* 28.1* 27.5*  MCV 94.6 94.1 93.2 97.6 97.5  PLT 165 143* 132* 150 131*   Cardiac Enzymes: No results for input(s): CKTOTAL, CKMB, CKMBINDEX, TROPONINI in the last 168 hours. CBG: Recent Labs  Lab 04/01/20 0751 04/01/20 1213 04/01/20 1616 04/01/20 1956 04/02/20 0629  GLUCAP 86 102* 125* 118* 87    Iron Studies: No results for input(s): IRON, TIBC,  TRANSFERRIN, FERRITIN in the last 72 hours. Studies/Results: No results found.  Medications: Infusions: . sodium chloride 10 mL/hr at 03/14/20 1441    Scheduled Medications: . darbepoetin (ARANESP) injection - DIALYSIS  100 mcg Intravenous Q Tue-HD  . docusate sodium  100 mg Oral BID  . feeding supplement  237 mL Oral TID BM  . heparin injection (subcutaneous)  5,000 Units Subcutaneous Q8H  . levETIRAcetam  250 mg Oral Q T,Th,Sa-HD  . levETIRAcetam  500 mg Oral Daily  . metoprolol tartrate  75 mg Oral BID  . multivitamin  1 tablet Oral QHS  . NIFEdipine  30 mg Oral Daily  . predniSONE  10 mg Oral Q breakfast    have reviewed scheduled and prn medications.  Physical Exam: General:NAD, laying flat in bed Heart:RRR, s1s2 nl Lungs:clear b/l, no w/r/r/c, unlabored, bl chest expansion Abdomen:soft, Non-tender, non-distended Extremities: no edema bl  LE's Dialysis Access: Right IJ TDC, site clean. LUE avf no thrill or bruit   Justin Mend 04/03/2020,11:11 AM  LOS: 38 days

## 2020-04-03 NOTE — Progress Notes (Signed)
Physical Therapy Treatment Patient Details Name: Barbara Crawford MRN: [id number] DOB: 08-05-1955 Today's Date: 04/03/2020    History of Present Illness 65 y.o. female who reported to ED with complaints of B LE swelling and LBP. CT abd/pelvis - no obstruction or acute abnormality. HGB 6.3. Pt admitted on 02/25/20 and diagnosed with acute renal faiulre. 2/23 pt had seizure and Code Blue with CPR x 4 minutes and intubated 2/23-2/25; CRRT; renal biopsy 2/28 with perinephric hematoma; pneumonia;  IJ catheter placed and HD initiated. Dialysis access placed 3/11. PMH includes HTN and COVID, which pt had 1 week prior to presenting to ED.    PT Comments    Pt admitted with above diagnosis. Pt was able to ambulate with OT prior to PT arrival.  This PT educated pt regarding HEP and gave handouts for HEP.  Pt verbalizes understanding.  Pt currently with functional limitations due to balance and endurance deficits. Pt will benefit from skilled PT to increase their independence and safety with mobility to allow discharge to the venue listed below.     Follow Up Recommendations  Home health PT;Supervision - Intermittent (HHOT, HHaide)     Equipment Recommendations  Rolling walker with 5" wheels    Recommendations for Other Services OT consult     Precautions / Restrictions Precautions Precautions: Fall Restrictions Weight Bearing Restrictions: No    Mobility  Bed Mobility               General bed mobility comments: Pt in chair    Transfers Overall transfer level: Needs assistance Equipment used: Rolling walker (2 wheeled) Transfers: Sit to/from Stand Sit to Stand: Supervision         General transfer comment: supervision for safety  Ambulation/Gait                 Stairs             Wheelchair Mobility    Modified Rankin (Stroke Patients Only)       Balance Overall balance assessment: Needs assistance Sitting-balance support: Feet  supported Sitting balance-Leahy Scale: Good Sitting balance - Comments: sitting EOB statically   Standing balance support: During functional activity;Bilateral upper extremity supported Standing balance-Leahy Scale: Fair Standing balance comment: BUE supported needed for functional mobility                            Cognition Arousal/Alertness: Awake/alert Behavior During Therapy: WFL for tasks assessed/performed Overall Cognitive Status: Within Functional Limits for tasks assessed                                 General Comments: pt became emotional in hallway when RN congratulated pt on how far she had walked      Exercises General Exercises - Lower Extremity Long Arc Quad: AROM;Both;10 reps;Seated Heel Slides: AROM;Both;5 reps;Supine Hip ABduction/ADduction: Both;5 reps;AROM;Supine Hip Flexion/Marching: Strengthening;Both;Standing;10 reps Toe Raises: AROM;Both;10 reps;Standing Mini-Sqauts: AROM;Both;10 reps;Standing Other Exercises Other Exercises: Gave pt an exercise program from [employer] - sent to [email or web address] - [email or web address]    General Comments General comments (skin integrity, edema, etc.): pt required one seated rest break during functional mobility with sats 97% on RA and HR 95 bpm      Pertinent Vitals/Pain Pain Assessment: No/denies pain    Home Living  Prior Function            PT Goals (current goals can now be found in the care plan section) Acute Rehab PT Goals Patient Stated Goal: none stated Progress towards PT goals: Progressing toward goals    Frequency    Min 3X/week      PT Plan Current plan remains appropriate    Co-evaluation              AM-PAC PT "6 Clicks" Mobility   Outcome Measure  Help needed turning from your back to your side while in a flat bed without using bedrails?: None Help needed moving from lying on your back to sitting on the side of a flat bed  without using bedrails?: A Little Help needed moving to and from a bed to a chair (including a wheelchair)?: A Little Help needed standing up from a chair using your arms (e.g., wheelchair or bedside chair)?: A Little Help needed to walk in hospital room?: A Little Help needed climbing 3-5 steps with a railing? : A Little 6 Click Score: 19    End of Session Equipment Utilized During Treatment: Gait belt Activity Tolerance: Patient limited by fatigue Patient left: with call bell/phone within reach;in chair Nurse Communication: Mobility status PT Visit Diagnosis: Muscle weakness (generalized) (M62.81)     Time: [id number] PT Time Calculation (min) (ACUTE ONLY): 32 min  Charges:  $Therapeutic Exercise: 23-37 mins                     Barbara Crawford M,PT Acute Rehab Services [phone] (pager)   Alvira Philips 04/03/2020, 12:48 PM

## 2020-04-04 ENCOUNTER — Other Ambulatory Visit: Payer: Self-pay

## 2020-04-04 DIAGNOSIS — N178 Other acute kidney failure: Secondary | ICD-10-CM

## 2020-04-04 LAB — RENAL FUNCTION PANEL
Albumin: 2.4 g/dL — ABNORMAL LOW (ref 3.5–5.0)
Anion gap: 10 (ref 5–15)
BUN: 39 mg/dL — ABNORMAL HIGH (ref 8–23)
CO2: 26 mmol/L (ref 22–32)
Calcium: 8.7 mg/dL — ABNORMAL LOW (ref 8.9–10.3)
Chloride: 95 mmol/L — ABNORMAL LOW (ref 98–111)
Creatinine, Ser: 7.91 mg/dL — ABNORMAL HIGH (ref 0.44–1.00)
GFR, Estimated: 5 mL/min — ABNORMAL LOW (ref >60)
Glucose, Bld: 80 mg/dL (ref 70–99)
Phosphorus: 3.5 mg/dL (ref 2.5–4.6)
Potassium: 4.8 mmol/L (ref 3.5–5.1)
Sodium: 131 mmol/L — ABNORMAL LOW (ref 135–145)

## 2020-04-04 LAB — CBC WITH DIFFERENTIAL/PLATELET
Abs Immature Granulocytes: 0.08 10*3/uL — ABNORMAL HIGH (ref 0.00–0.07)
Basophils Absolute: 0.1 10*3/uL (ref 0.0–0.1)
Basophils Relative: 1 %
Eosinophils Absolute: 0.9 10*3/uL — ABNORMAL HIGH (ref 0.0–0.5)
Eosinophils Relative: 12 %
HCT: 27.1 % — ABNORMAL LOW (ref 36.0–46.0)
Hemoglobin: 8.5 g/dL — ABNORMAL LOW (ref 12.0–15.0)
Immature Granulocytes: 1 %
Lymphocytes Relative: 17 %
Lymphs Abs: 1.2 10*3/uL (ref 0.7–4.0)
MCH: 30.5 pg (ref 26.0–34.0)
MCHC: 31.4 g/dL (ref 30.0–36.0)
MCV: 97.1 fL (ref 80.0–100.0)
Monocytes Absolute: 0.9 10*3/uL (ref 0.1–1.0)
Monocytes Relative: 13 %
Neutro Abs: 3.9 10*3/uL (ref 1.7–7.7)
Neutrophils Relative %: 56 %
Platelets: 161 10*3/uL (ref 150–400)
RBC: 2.79 MIL/uL — ABNORMAL LOW (ref 3.87–5.11)
RDW: 17.6 % — ABNORMAL HIGH (ref 11.5–15.5)
WBC: 7 10*3/uL (ref 4.0–10.5)
nRBC: 0 % (ref 0.0–0.2)

## 2020-04-04 LAB — SARS CORONAVIRUS 2 (TAT 6-24 HRS): SARS Coronavirus 2: NEGATIVE

## 2020-04-04 MED ORDER — HEPARIN SODIUM (PORCINE) 1000 UNIT/ML IJ SOLN
INTRAMUSCULAR | Status: AC
  Filled 2020-04-04: qty 1

## 2020-04-04 MED ORDER — LEVETIRACETAM 250 MG PO TABS: 250.0000 mg | ORAL_TABLET | ORAL | 0 refills | Status: DC

## 2020-04-04 MED ORDER — RENA-VITE PO TABS: 1.0000 | ORAL_TABLET | Freq: Every day | ORAL | 0 refills | Status: AC

## 2020-04-04 MED ORDER — METOPROLOL TARTRATE 75 MG PO TABS: 75.0000 mg | ORAL_TABLET | Freq: Two times a day (BID) | ORAL | 0 refills | Status: DC

## 2020-04-04 MED ORDER — LEVETIRACETAM 500 MG PO TABS: 500.0000 mg | ORAL_TABLET | Freq: Every day | ORAL | 0 refills | Status: DC

## 2020-04-04 MED ORDER — PREDNISONE 10 MG PO TABS: 10.0000 mg | ORAL_TABLET | Freq: Every day | ORAL | 0 refills | Status: AC

## 2020-04-04 MED ORDER — NIFEDIPINE ER 30 MG PO TB24: 30.0000 mg | ORAL_TABLET | Freq: Every day | ORAL | 0 refills | Status: DC

## 2020-04-04 NOTE — Progress Notes (Signed)
Patient discharged in stable condition via car with daughter. AVS documentation given and explained. All questions and conerns were addressed.

## 2020-04-04 NOTE — TOC Transition Note (Signed)
Transition of Care [facility]) - CM/SW Discharge Note   Patient Details  Name: Barbara Crawford MRN: [id number] Date of Birth: 1955/04/05  [facility]) CM/SW Contact:  Carles Collet, RN Phone Number: 04/04/2020, 12:09 PM   Clinical Narrative:    Notified [facility]/ [facility] that patient will DC today.  Reminded bedside RN to collect COVID prior to DC      Barriers to Discharge: Continued Medical Work up   Patient Goals and CMS Choice Patient states their goals for this hospitalization and ongoing recovery are:: "get back on my feet" CMS Medicare.gov Compare Post Acute Care list provided to:: Patient Choice offered to / list presented to : Patient  Discharge Placement                       Discharge Plan and Services     Post Acute Care Choice: [place]: PT,OT [place] Agency: [facility] [employer]) Date [place]: 03/09/20 Time [place]: 1100 Representative spoke with at [place]: [place]  Social Determinants of Health ([place]) Interventions     Readmission Risk Interventions No flowsheet data found.

## 2020-04-04 NOTE — Plan of Care (Signed)
  Problem: Clinical Measurements: Goal: Will remain free from infection Outcome: Adequate for Discharge   Problem: Clinical Measurements: Goal: Ability to maintain clinical measurements within normal limits will improve Outcome: Adequate for Discharge Goal: Will remain free from infection Outcome: Adequate for Discharge Goal: Diagnostic test results will improve Outcome: Adequate for Discharge Goal: Respiratory complications will improve Outcome: Adequate for Discharge Goal: Cardiovascular complication will be avoided Outcome: Adequate for Discharge   Problem: Activity: Goal: Risk for activity intolerance will decrease Outcome: Adequate for Discharge   Problem: Nutrition: Goal: Adequate nutrition will be maintained Outcome: Adequate for Discharge   Problem: Coping: Goal: Level of anxiety will decrease Outcome: Adequate for Discharge   Problem: Elimination: Goal: Will not experience complications related to bowel motility Outcome: Adequate for Discharge Goal: Will not experience complications related to urinary retention Outcome: Adequate for Discharge   Problem: Pain Managment: Goal: General experience of comfort will improve Outcome: Adequate for Discharge   Problem: Skin Integrity: Goal: Risk for impaired skin integrity will decrease Outcome: Adequate for Discharge   Problem: Education: Goal: Knowledge of disease and its progression will improve Outcome: Adequate for Discharge   Problem: Health Behavior/Discharge Planning: Goal: Ability to manage health-related needs will improve Outcome: Adequate for Discharge   Problem: Clinical Measurements: Goal: Complications related to the disease process or treatment will be avoided or minimized Outcome: Adequate for Discharge Goal: Dialysis access will remain free of complications Outcome: Adequate for Discharge   Problem: Activity: Goal: Activity intolerance will improve Outcome: Adequate for Discharge   Problem:  Fluid Volume: Goal: Fluid volume balance will be maintained or improved Outcome: Adequate for Discharge   Problem: Nutritional: Goal: Ability to make appropriate dietary choices will improve Outcome: Adequate for Discharge   Problem: Respiratory: Goal: Respiratory symptoms related to disease process will be avoided Outcome: Adequate for Discharge   Problem: Self-Concept: Goal: Body image disturbance will be avoided or minimized Outcome: Adequate for Discharge   Problem: Urinary Elimination: Goal: Progression of disease will be identified and treated Outcome: Adequate for Discharge   Problem: Education: Goal: Knowledge of disease and its progression will improve Outcome: Adequate for Discharge   Problem: Health Behavior/Discharge Planning: Goal: Ability to manage health-related needs will improve Outcome: Adequate for Discharge   Problem: Clinical Measurements: Goal: Complications related to the disease process or treatment will be avoided or minimized Outcome: Adequate for Discharge Goal: Dialysis access will remain free of complications Outcome: Adequate for Discharge   Problem: Fluid Volume: Goal: Fluid volume balance will be maintained or improved Outcome: Adequate for Discharge   P Problem: Self-Concept: Goal: Body image disturbance will be avoided or minimized Outcome: Adequate for Discharge   Problem: Urinary Elimination: Goal: Progression of disease will be identified and treated Outcome: Adequate for Discharge   Problem: Activity: Goal: Ability to tolerate increased activity will improve Outcome: Adequate for Discharge   Problem: Respiratory: Goal: Ability to maintain a clear airway and adequate ventilation will improve Outcome: Adequate for Discharge   Problem: Role Relationship: Goal: Method of communication will improve Outcome: Adequate for Discharge  oal: Ability to make appropriate dietary choices will improve Outcome: Adequate for Discharge    Problem: Respiratory: Goal: Respiratory symptoms related to disease process will be avoided Outcome: Adequate for Discharge   Problem: Respiratory: Goal: Respiratory symptoms related to disease process will be avoided Outcome: Adequate for Discharge

## 2020-04-04 NOTE — Progress Notes (Signed)
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 . -lue bc avf 3/11 clotted, VVS aware; appreciate assistance --> planning AVG outpt in near future -Cont HD via RIJ TDC -URR 80% on 3h16mn tx, volume hasn't been an issue so keep 3h173m -Has outpt HD to start 4/5, d/c after HD today.  COVID will be checked this AM. Pt aware.  Anemia: Likely from underlying chronic disease and surgical losses.  Ferritin level very high. Continue Aranesp and transfuse as needed. increased dose to 10062mqtuesdays  Hypertension/Volume: Euvolemic, minimal UOP.  UF to manage volume  Secondary hyperparathyroidism: Calcium and phosphorus under acceptable control, not on binders at this time.  PTH level was 193 on 2/23-at goal for ESRD.  Hyponatremia: stable and mild. Managed with dialysis, continue fluid restriction.  Has outpt HD arranged now.  Plan d/c today after HD.  Pt aware.  Subjective: Seen in dialysis. No overnight issues. DC today!  Objective Vital signs in last 24 hours: Vitals:   04/03/20 0552 04/03/20 0700 04/03/20 2041 04/04/20 0541  BP: 132/80 109/72 115/75 125/73  Pulse: 84 89 86 80  Resp: '20  19 19  '$ Temp: 98.7 F (37.1 C) 98.5 F (36.9 C) 98.4 F (36.9 C) 97.8 F (36.6 C)  TempSrc: Oral Oral Oral Oral  SpO2: 99% 100% 100% 97%  Weight:      Height:       Weight change:  No intake or output data in the 24 hours ending 04/04/20 0744     Labs: Basic Metabolic Panel: Recent Labs  Lab 04/02/20 0154 04/02/20 1125 04/03/20 0132 04/04/20 0114  NA 134*  --  135 131*  K 4.9  --  4.8 4.8  CL 98  --  98 95*  CO2 26  --  29 26  GLUCOSE 101*  --  82 80  BUN 42* 8 26* 39*  CREATININE 7.98*  --  5.51* 7.91*  CALCIUM 8.8*  --  8.8* 8.7*  PHOS 3.0  --  2.8  3.5   Liver Function Tests: Recent Labs  Lab 04/02/20 0154 04/03/20 0132 04/04/20 0114  ALBUMIN 2.4* 2.4* 2.4*   No results for input(s): LIPASE, AMYLASE in the last 168 hours. No results for input(s): AMMONIA in the last 168 hours. CBC: Recent Labs  Lab 03/31/20 0651 03/31/20 0651 04/01/20 0342 04/02/20 0154 04/03/20 0132 04/04/20 0114  WBC 6.4  --  6.8 7.8 6.9 7.0  NEUTROABS  --    < > 4.6 4.9 3.9 3.9  HGB 9.0*  --  8.5* 8.6* 8.6* 8.5*  HCT 28.7*  --  26.0* 28.1* 27.5* 27.1*  MCV 94.1  --  93.2 97.6 97.5 97.1  PLT 143*  --  132* 150 131* 161   < > = values in this interval not displayed.   Cardiac Enzymes: No results for input(s): CKTOTAL, CKMB, CKMBINDEX, TROPONINI in the last 168 hours. CBG: Recent Labs  Lab 04/01/20 0751 04/01/20 1213 04/01/20 1616 04/01/20 1956 04/02/20 0629  GLUCAP 86 102* 125* 118* 87    Iron Studies: No results for input(s): IRON, TIBC, TRANSFERRIN, FERRITIN in the last 72 hours. Studies/Results: No results found.  Medications: Infusions: . sodium chloride 10 mL/hr at 03/14/20 1441    Scheduled Medications: . darbepoetin (ARANESP) injection - DIALYSIS  100 mcg Intravenous Q Tue-HD  .  docusate sodium  100 mg Oral BID  . feeding supplement  237 mL Oral TID BM  . heparin injection (subcutaneous)  5,000 Units Subcutaneous Q8H  . levETIRAcetam  250 mg Oral Q T,Th,Sa-HD  . levETIRAcetam  500 mg Oral Daily  . metoprolol tartrate  75 mg Oral BID  . multivitamin  1 tablet Oral QHS  . NIFEdipine  30 mg Oral Daily  . predniSONE  10 mg Oral Q breakfast    have reviewed scheduled and prn medications.  Physical Exam: General:NAD, laying flat in bed Heart:RRR, s1s2 nl Lungs:clear b/l, no w/r/r/c, unlabored, bl chest expansion Abdomen:soft, Non-tender, non-distended Extremities: no edema bl LE's Dialysis Access: Right IJ TDC, site clean. LUE avf no thrill or bruit   Justin Mend 04/04/2020,7:44 AM  LOS: 39 days

## 2020-04-04 NOTE — Discharge Summary (Signed)
Discharge Summary  Barbara Crawford [id number] DOB: Apr 27, 1955  PCP: Vonna Drafts, FNP  Admit date: 02/25/2020 Discharge date: 04/04/2020  Time spent: 40 mins  Recommendations for Outpatient Follow-up:  1. Follow-up with PCP in 1 week 2. Follow-up with vascular surgery as scheduled 3. Follow-up with dialysis as scheduled   Discharge Diagnoses:  Active Hospital Problems   Diagnosis Date Noted  . Acute renal failure ([place]) 02/25/2020  . Encounter for orogastric (OG) tube placement   . FSGS (focal segmental glomerulosclerosis)   . Pleural effusion 03/07/2020  . ABLA (acute blood loss anemia) 03/06/2020  . Sepsis ([place]) versus SIRS due to autoimmune process 03/05/2020  . Fever   . Glomerulonephritis   . Perinephric hematoma 03/04/2020  . Seizure ([place]) 03/04/2020  . Lobar pneumonia ([place]) 03/04/2020  . Normocytic anemia 02/25/2020  . Uremia 02/25/2020  . HTN (hypertension) 02/25/2020    Resolved Hospital Problems   Diagnosis Date Noted Date Resolved  . Cardiac arrest with pulseless electrical activity ([place]) 03/04/2020 03/06/2020  . Acute on chronic respiratory failure with hypoxia ([place]) 03/04/2020 03/06/2020  . Asymptomatic bacteriuria 03/04/2020 03/06/2020  . Ventilator dependence ([place])  03/06/2020    Discharge Condition: Stable  Diet recommendation: Renal diet  Vitals:   04/04/20 1030 04/04/20 1104  BP:  (!) 118/57  Pulse: 80 81  Resp:  20  Temp:  98.3 F (36.8 C)  SpO2:  97%    History of present illness:  65 yo female with past medical history of hypertension, prior Covid (Dec 2021) presented to the hospital with complaints of lower extremity swelling, back pain, generalized malaise, fatigue at home. In the ED,she was found to have a hemoglobin of 6.3, potassium 5.4, sodium 130, bicarb 9, BUN 144, and creatinine 29.98. CT of the abdomen and pelvis was negative for obstruction or acute abnormality. Temporary hemodialysis catheter was placed by IR  and subsequently, the patient had a PEA arrest associated with seizure-like activity. She was intubated and subsequently extubated on 02/28/2020. 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 Renal biopsy was done 03/03/2019. ANA positive. ID followed the patient for fever. Dialysis cath was removed on 03/06/20 and subsequently underwent permacath placement. During the hospitalization, patient continued to have fever so ID wasreconsulted.Extensive work-up was done. Recent x-ray had shown consolidation in the lungs. Patient was started on vancomycin and cefepime for possible healthcare associated pneumonia and subsequentlycompleted the course of antibiotic.The thought process was that there was possibility of autoimmune disease causing kidney disease and fever. She was treated with prednisone with improvement in fever. Currently awaiting for outpatient hemodialysis arrangement.She was noted to have slightly lower hemoglobin level on 03/24/20 requiring 1 unit PRBC transfusion. No overt bleeding identified. She is currently medically stable for discharge.    Today, patient denied any new complaints, stable for discharge.  Advised to follow-up with PCP, vascular surgeon, and nephrologist.  Advised to be compliant with dialysis.  Patient verbalized understanding.    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  Vascular surgery was reconsulted: They are planning for further intervention as an outpatient. Outpatient hemodialysis unit arrangement has been made  Fever/possible healthcare associated pneumonia Remains afebrile Possible autoimmune etiology of fever Completed course of vancomycin and cefepime Blood/urine cultures have been negative so far Duplex ultrasound was negative for DVT. Continue incentive spirometry CT scan of the abdomen pelvis with evolving left perinephric hematoma. CT scan of the chest was performed which showed mediastinal and axillary lymph nodes with decreasing bilateral pleural effusion. Continue tapering doses of prednisone for 1 more day  Anemia of chronic disease Hemoglobin stable Status post 3 units of packed red cells transfusion during this hospitalization; last transfusion was on 03/24/2020 for hemoglobin of 6.9  Seizure Patient had seizure with loss of pulse and PEA arrest status post CPR Patient was seen by neurology during his hospitalization. No further seizures Continue current doses of Keppra.  Outpatient follow-up with neurology  Rash Patient developed rash on 03/25/2020 after receiving CHG bath with wipes: Mostly over the thighs and arm.  CHG bath and wipes have been discontinued.  Monitor.  Use loratadine/antiallergic cream for itching  Essential hypertension Blood pressure stable Continue nifedipine, metoprolol  Acute on chronic respiratory failure with hypoxia Resolved.  Extubated on 02/28/2020.  Currently on room air  Cardiac arrest with pulseless electrical activity Status post CPR and intubation.  Stable at this time  Debility/deconditioning Home health PT on discharge      Malnutrition Type:  Nutrition Problem: Increased nutrient needs Etiology: acute illness (renal failure requiring CRRT)   Malnutrition Characteristics:  Signs/Symptoms: estimated needs   Nutrition Interventions:  Interventions: Ensure Enlive (each supplement provides 350kcal and 20 grams of protein),MVI,Education,Liberalize Diet   Estimated body mass index is 23.44 kg/m as calculated from the following:   Height as of this encounter: 5' 2.99" (1.6 m).   Weight as of this encounter: 60 kg.    Procedures: Intubation/extubation Renal biopsy IR guided tunneled dialysis catheter placement Left brachiocephalic fistula creation on 03/13/2020  Consultations:  PCCM/nephrology/IR/ID/vascular surgery/neurology   Discharge Exam: BP (!) 118/57 (BP Location: Right Arm)   Pulse 81   Temp 98.3 F (36.8 C) (Oral)   Resp 20   Ht 5' 2.99" (1.6 m)   Wt 60 kg Comment: stood to scale  SpO2 97%   BMI 23.44 kg/m   General: NAD Cardiovascular: S1, S2 present Respiratory: CTA B    Discharge Instructions You were cared for by a hospitalist during your hospital stay. If you have any questions about your discharge medications or the care you received while you were in the hospital after you are discharged, you can call the unit and asked to speak with the hospitalist on call if the hospitalist that took care of you is not available. Once you are discharged, your primary care physician will handle any further medical issues. Please note that NO REFILLS for any discharge medications will be authorized once you are discharged, as it is imperative that you return to your primary care physician (or establish a relationship with a primary care physician if you do not have one) for your aftercare needs so that they can reassess your need for medications and monitor your lab values.  Discharge Instructions  Diet - low sodium heart healthy   Complete by: As directed    Increase activity slowly    Complete by: As directed    No wound care   Complete by: As directed      Allergies as of 04/04/2020      Reactions   Erythromycin Other (See Comments)   Pt does not recall   Iodine Other (See Comments)   Arm flares up per pt   Penicillins    Has patient had a PCN reaction causing immediate rash, facial/tongue/throat swelling, SOB or lightheadedness with hypotension: no Has patient had a PCN reaction causing severe rash involving mucus membranes or skin necrosis: yes Has patient had a PCN reaction that required hospitalization: no Has patient had a PCN reaction occurring within the last 10 years: no If all of the above answers are "NO", then may proceed with Cephalosporin use.   Shellfish-derived Products    Chlorhexidine Rash      Medication List    STOP taking these medications   lisinopril 5 MG tablet Commonly known as: ZESTRIL     TAKE these medications   acetaminophen 650 MG CR tablet Commonly known as: TYLENOL Take 650 mg by mouth as needed for pain.   aspirin EC 81 MG tablet Take 81 mg by mouth daily.   HYDROcodone-acetaminophen 5-325 MG tablet Commonly known as: NORCO/VICODIN Take 1 tablet by mouth 3 (three) times daily as needed for severe pain.   levETIRAcetam 250 MG tablet Commonly known as: KEPPRA Take 1 tablet (250 mg total) by mouth Every Tuesday,Thursday,and Saturday with dialysis.   levETIRAcetam 500 MG tablet Commonly known as: KEPPRA Take 1 tablet (500 mg total) by mouth daily.   Metoprolol Tartrate 75 MG Tabs Take 75 mg by mouth 2 (two) times daily.   multivitamin Tabs tablet Take 1 tablet by mouth at bedtime.   NIFEdipine 30 MG 24 hr tablet Commonly known as: ADALAT CC Take 1 tablet (30 mg total) by mouth daily. What changed:   medication strength  how much to take  when to take this  Another medication with the same name was removed. Continue taking this medication, and follow the directions you see here.   predniSONE 10 MG  tablet Commonly known as: DELTASONE Take 1 tablet (10 mg total) by mouth daily with breakfast for 2 days.            Durable Medical Equipment  (From admission, onward)         Start     Ordered   03/12/20 1058  For home use only DME 3 n 1  Once        03/12/20 1104   03/12/20 1058  For home use only DME Walker  Once       Question Answer Comment  Patient needs a walker to treat with the following condition Acute on chronic systolic (congestive) heart failure ([place])   Patient needs a walker to treat with the following condition Generalized muscle weakness      03/12/20 1104         Allergies  Allergen Reactions  . Erythromycin Other (See Comments)    Pt does not recall  . Iodine Other (See Comments)    Arm flares up per pt  . Penicillins     Has patient had a PCN reaction causing immediate rash, facial/tongue/throat swelling, SOB or lightheadedness with hypotension: no Has patient had a PCN reaction causing severe rash involving mucus membranes or skin necrosis:  yes Has patient had a PCN reaction that required hospitalization: no Has patient had a PCN reaction occurring within the last 10 years: no If all of the above answers are "NO", then may proceed with Cephalosporin use.  . Shellfish-Derived Products   . Chlorhexidine Rash    Follow-up Information    [facility] Follow up.   Why: [facility] will contact you within 24-48 hours to schedule your first home visit.  Contact information: [address],  [place], [place] [address] P: [phone]       Marty Heck, MD.   Specialty: Vascular Surgery Why: 4-6 weeks. The office will call the patient with an appointment(sent) Contact information: [place] [address] [phone]        Vonna Drafts, FNP. Schedule an appointment as soon as possible for a visit in 1 week(s).   Specialty: Nurse Practitioner Contact information: [address]  [address] [place] [address] [phone]                The results of significant diagnostics from this hospitalization (including imaging, microbiology, ancillary and laboratory) are listed below for reference.    Significant Diagnostic Studies: CT CHEST WO CONTRAST  Result Date: 03/17/2020 CLINICAL DATA:  Sepsis, cough, post CPR EXAM: CT CHEST WITHOUT CONTRAST TECHNIQUE: Multidetector CT imaging of the chest was performed following the standard protocol without IV contrast. COMPARISON:  03/06/2020 FINDINGS: Cardiovascular: Cardiomegaly. Scattered aortic calcifications. No aneurysm. Mediastinum/Nodes: Small and borderline sized mediastinal lymph nodes are stable since prior study. Bilateral axillary borderline lymph nodes also stable since prior study. Trachea and esophagus are unremarkable. Thyroid unremarkable. Lungs/Pleura: Decreasing bilateral effusions with residual small left effusion and trace right effusion. Compressive atelectasis versus pneumonia in the left lower lobe. Upper Abdomen: Imaging into the upper abdomen demonstrates no acute findings. Musculoskeletal: Chest wall soft tissues are unremarkable. No acute bony abnormality. IMPRESSION: Stable cardiomegaly. Stable shoddy mediastinal and bilateral axillary lymph nodes. Decreasing bilateral effusions with small residual left pleural effusion and trace right pleural effusion. Compressive atelectasis versus pneumonia in the left lower lobe. Aortic Atherosclerosis (ICD10-I70.0). Electronically Signed   By: Rolm Baptise M.D.   On: 03/17/2020 09:56   CT CHEST WO CONTRAST  Result Date: 03/06/2020 CLINICAL DATA:  Fever of unknown origin. Recent COVID infection. Progressive fatigue and back pain. Recent renal biopsy. EXAM: CT CHEST WITHOUT CONTRAST TECHNIQUE: Multidetector CT imaging of the chest was performed following the standard protocol without IV contrast. COMPARISON:  Lung bases from abdominal CT 03/03/2020. Chest radiograph  03/01/2020 FINDINGS: Cardiovascular: Moderate aortic atherosclerosis. Mild cardiomegaly. Trace pericardial fluid. Decreased density of cardiac blood pool consistent with provided history of anemia. Mediastinum/Nodes: Shotty mediastinal lymph nodes with prominent nodes measuring up to 9 mm short axis. Limited assessment for hilar adenopathy on this noncontrast exam. Small bilateral axillary lymph nodes not enlarged by size criteria. No thyroid nodule. Decompressed esophagus. Lungs/Pleura: Moderate bilateral pleural effusions. Pleural effusion on the right is not significantly changed, pleural effusion on the left has increased in size. Associated compressive atelectasis in the left greater than right lower lobe. No other focal airspace disease. No findings of pulmonary edema. Trachea and central bronchi are patent. Upper Abdomen: Left perinephric hematoma, mild improvement. Cholecystectomy. Musculoskeletal: Generalized subcutaneous body wall edema. Minimal thoracic spondylosis with endplate spurring. There are no acute or suspicious osseous abnormalities. IMPRESSION: 1. Moderate bilateral pleural effusions with compressive atelectasis in the left greater than right lower lobe. Pleural effusion  on the right is not significantly changed from recent abdominal CT, pleural effusion on the left has increased in size. 2. Shotty mediastinal lymph nodes are likely reactive. 3. Generalized subcutaneous body wall edema. 4. Mild cardiomegaly. 5. Improving left perinephric hematoma. Aortic Atherosclerosis (ICD10-I70.0). Electronically Signed   By: Keith Rake M.D.   On: 03/06/2020 17:26   CT ABDOMEN PELVIS W CONTRAST  Result Date: 03/16/2020 CLINICAL DATA:  Abdominal pain and fever. EXAM: CT ABDOMEN AND PELVIS WITH CONTRAST TECHNIQUE: Multidetector CT imaging of the abdomen and pelvis was performed using the standard protocol following bolus administration of intravenous contrast. CONTRAST:  20m OMNIPAQUE IOHEXOL 300  MG/ML  SOLN COMPARISON:  March 03, 2020 FINDINGS: Lower chest: Mild to moderate severity atelectasis is seen within the left lung base. Mild atelectasis is also noted along the posterior aspect of the right lung base. These areas are decreased in severity when compared to the prior study. There is a small left pleural effusion. This is mildly decreased in size. Hepatobiliary: No focal liver abnormality is seen. Status post cholecystectomy. No biliary dilatation. Pancreas: Unremarkable. No pancreatic ductal dilatation or surrounding inflammatory changes. Spleen: Normal in size without focal abnormality. Adrenals/Urinary Tract: Adrenal glands are unremarkable. The right kidney is normal in size, without obstructing renal calculi, focal lesion, or hydronephrosis. A 7 mm nonobstructing renal stone is seen within the right kidney. An evolving left perinephric hematoma is seen. This measures approximately 1.8 cm in maximum thickness. No acute hemorrhagic component is seen. A mild amount of adjacent inflammatory fat stranding is present. The urinary bladder is partially contracted and subsequently limited in evaluation. Stomach/Bowel: Stomach is within normal limits. The appendix is not identified. No evidence of bowel wall thickening, distention, or inflammatory changes. Vascular/Lymphatic: No significant vascular findings are present. No enlarged abdominal or pelvic lymph nodes. Reproductive: Multiple parenchymal calcifications are seen within the lobulated uterus. The bilateral adnexa are unremarkable. Other: No abdominal wall hernia or abnormality. No abdominopelvic ascites. Musculoskeletal: Degenerative changes are seen within the lumbar spine. IMPRESSION: 1. Evolving left perinephric hematoma, without evidence of acute hemorrhagic component. 2. 7 mm nonobstructing renal stone within the right kidney. 3. Bibasilar atelectasis, left greater than right. 4. Small left pleural effusion. 5. Evidence of prior  cholecystectomy. Electronically Signed   By: TVirgina NorfolkM.D.   On: 03/16/2020 19:44   IR Fluoro Guide CV Line Right  Result Date: 03/09/2020 INDICATION: End-stage renal disease, no current access for dialysis EXAM: ULTRASOUND GUIDANCE FOR VASCULAR ACCESS RIGHT INTERNAL JUGULAR PERMANENT HEMODIALYSIS CATHETER Date:  03/09/2020 03/09/2020 1:54 pm Radiologist:  MJerilynn Mages TDaryll Brod MD Guidance:  Ultrasound and fluoroscopic FLUOROSCOPY TIME:  Fluoroscopy Time: 0 minutes 36 seconds (2 mGy). MEDICATIONS: 1 g vancomycin within 1 hour of the procedure ANESTHESIA/SEDATION: Versed 1.5 mg IV; Fentanyl 50 mcg IV; Moderate Sedation Time:  16 minutes The patient was continuously monitored during the procedure by the interventional radiology nurse under my direct supervision. CONTRAST:  None. COMPLICATIONS: None immediate. PROCEDURE: Informed consent was obtained from the patient following explanation of the procedure, risks, benefits and alternatives. The patient understands, agrees and consents for the procedure. All questions were addressed. A time out was performed. Maximal barrier sterile technique utilized including caps, mask, sterile gowns, sterile gloves, large sterile drape, hand hygiene, and 2% chlorhexidine scrub. Under sterile conditions and local anesthesia, right internal jugular micropuncture venous access was performed with ultrasound. Images were obtained for documentation of the patent right internal jugular vein. A guide wire was inserted  followed by a transitional dilator. Next, a 0.035 guidewire was advanced into the IVC with a 5-French catheter. Measurements were obtained from the right venotomy site to the proximal right atrium. In the right infraclavicular chest, a subcutaneous tunnel was created under sterile conditions and local anesthesia. 1% lidocaine with epinephrine was utilized for this. The 19 cm tip to cuff palindrome catheter was tunneled subcutaneously to the venotomy site and inserted into  the SVC/RA junction through a valved peel-away sheath. Position was confirmed with fluoroscopy. Images were obtained for documentation. Blood was aspirated from the catheter followed by saline and heparin flushes. The appropriate volume and strength of heparin was instilled in each lumen. Caps were applied. The catheter was secured at the tunnel site with Gelfoam and a pursestring suture. The venotomy site was closed with subcuticular Vicryl suture. Dermabond was applied to the small right neck incision. A dry sterile dressing was applied. The catheter is ready for use. No immediate complications. IMPRESSION: Ultrasound and fluoroscopically guided right internal jugular tunneled hemodialysis catheter (19 cm tip to cuff palindrome catheter). Electronically Signed   By: Jerilynn Mages.  Shick M.D.   On: 03/09/2020 14:03   IR US Guide Vasc Access Right  Result Date: 03/09/2020 INDICATION: End-stage renal disease, no current access for dialysis EXAM: ULTRASOUND GUIDANCE FOR VASCULAR ACCESS RIGHT INTERNAL JUGULAR PERMANENT HEMODIALYSIS CATHETER Date:  03/09/2020 03/09/2020 1:54 pm Radiologist:  Jerilynn Mages. Daryll Brod, MD Guidance:  Ultrasound and fluoroscopic FLUOROSCOPY TIME:  Fluoroscopy Time: 0 minutes 36 seconds (2 mGy). MEDICATIONS: 1 g vancomycin within 1 hour of the procedure ANESTHESIA/SEDATION: Versed 1.5 mg IV; Fentanyl 50 mcg IV; Moderate Sedation Time:  16 minutes The patient was continuously monitored during the procedure by the interventional radiology nurse under my direct supervision. CONTRAST:  None. COMPLICATIONS: None immediate. PROCEDURE: Informed consent was obtained from the patient following explanation of the procedure, risks, benefits and alternatives. The patient understands, agrees and consents for the procedure. All questions were addressed. A time out was performed. Maximal barrier sterile technique utilized including caps, mask, sterile gowns, sterile gloves, large sterile drape, hand hygiene, and 2%  chlorhexidine scrub. Under sterile conditions and local anesthesia, right internal jugular micropuncture venous access was performed with ultrasound. Images were obtained for documentation of the patent right internal jugular vein. A guide wire was inserted followed by a transitional dilator. Next, a 0.035 guidewire was advanced into the IVC with a 5-French catheter. Measurements were obtained from the right venotomy site to the proximal right atrium. In the right infraclavicular chest, a subcutaneous tunnel was created under sterile conditions and local anesthesia. 1% lidocaine with epinephrine was utilized for this. The 19 cm tip to cuff palindrome catheter was tunneled subcutaneously to the venotomy site and inserted into the SVC/RA junction through a valved peel-away sheath. Position was confirmed with fluoroscopy. Images were obtained for documentation. Blood was aspirated from the catheter followed by saline and heparin flushes. The appropriate volume and strength of heparin was instilled in each lumen. Caps were applied. The catheter was secured at the tunnel site with Gelfoam and a pursestring suture. The venotomy site was closed with subcuticular Vicryl suture. Dermabond was applied to the small right neck incision. A dry sterile dressing was applied. The catheter is ready for use. No immediate complications. IMPRESSION: Ultrasound and fluoroscopically guided right internal jugular tunneled hemodialysis catheter (19 cm tip to cuff palindrome catheter). Electronically Signed   By: Jerilynn Mages.  Shick M.D.   On: 03/09/2020 14:03   DG Chest Port 1  View  Result Date: 03/13/2020 CLINICAL DATA:  Pneumonia EXAM: PORTABLE CHEST 1 VIEW COMPARISON:  03/01/2020 FINDINGS: Single frontal view of the chest demonstrates increased opacification of the lower left hemithorax, compatible with increased consolidation and effusion. Right chest is clear. No pneumothorax. Cardiac silhouette is stable. Right internal jugular dialysis  catheter tip overlies superior vena cava. IMPRESSION: 1. Increasing left basilar consolidation and effusion, compatible with given history of pneumonia. Electronically Signed   By: Randa Ngo M.D.   On: 03/13/2020 21:12   VAS US DUPLEX DIALYSIS ACCESS (AVF, AVG)  Result Date: 03/31/2020 DIALYSIS ACCESS Reason for Exam: No palpable thrill for AVF/AVG. Access Site: Left Upper Extremity. Access Type: Brachial-cephalic AVF. History: Fistula sx done on 03/13/20. Performing Technologist: Abram Sander RVS  Examination Guidelines: A complete evaluation includes B-mode imaging, spectral Doppler, color Doppler, and power Doppler as needed of all accessible portions of each vessel. Unilateral testing is considered an integral part of a complete examination. Limited examinations for reoccurring indications may be performed as noted.  Findings: +--------------------+----------+-----------------+--------+ AVF                 PSV (cm/s)Flow Vol (mL/min)Comments +--------------------+----------+-----------------+--------+ Native artery inflow    62                              +--------------------+----------+-----------------+--------+   +--------------------+----------+-----------------+--------+ AVG                 PSV (cm/s)Flow Vol (mL/min)Describe +--------------------+----------+-----------------+--------+ Native artery inflow    62                              +--------------------+----------+-----------------+--------+ Arterial anastomosis                           occluded +--------------------+----------+-----------------+--------+ Prox graft                                     occluded +--------------------+----------+-----------------+--------+ Mid graft                                      occluded +--------------------+----------+-----------------+--------+ Distal graft                                   occluded  +--------------------+----------+-----------------+--------+ Venous anastomosis                             occluded +--------------------+----------+-----------------+--------+  Summary: 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  *See table(s) above for measurements and observations.  Diagnosing physician: Ruta Hinds MD Electronically signed by Ruta Hinds MD on 03/31/2020 at 3:13:43 PM.   --------------------------------------------------------------------------------   Final    ECHOCARDIOGRAM COMPLETE  Result Date: 03/06/2020    ECHOCARDIOGRAM REPORT   Patient Name:   FLORIDALMA LIAO Date of Exam: 03/06/2020 Medical Rec #:  [id number]             Height:       63.0 in Accession #:    [id number]            Weight:       167.5 lb  Date of Birth:  March 18, 1955            BSA:          1.793 m Patient Age:    65 years              BP:           151/95 mmHg Patient Gender: F                     HR:           121 bpm. Exam Location:  Inpatient Procedure: 2D Echo, Cardiac Doppler and Color Doppler Indications:    Fever  History:        Patient has no prior history of Echocardiogram examinations.                 Risk Factors:Hypertension. H/O COVID-19 infection.  Sonographer:    Clayton Lefort RDCS (AE) Referring Phys: [id number] [place]  1. Left ventricular ejection fraction, by estimation, is 50 to 55%. The left ventricle has low normal function. The left ventricle has no regional wall motion abnormalities. There is moderate left ventricular hypertrophy. Indeterminate diastolic filling  due to E-A fusion. There is incoordinate septal motion.  2. Right ventricular systolic function is normal. The right ventricular size is normal. There is normal pulmonary artery systolic pressure. The estimated right ventricular systolic pressure is [id number] mmHg.  3. Left atrial size was moderately dilated.  4. The pericardial effusion is posterior to the left ventricle.  5. The mitral valve is grossly normal. Trivial mitral valve  regurgitation.  6. The aortic valve is tricuspid. Aortic valve regurgitation is not visualized. Mild aortic valve sclerosis is present, with no evidence of aortic valve stenosis.  7. The inferior vena cava is normal in size with greater than 50% respiratory variability, suggesting right atrial pressure of 3 mmHg. Comparison(s): No prior Echocardiogram. Conclusion(s)/Recommendation(s): No evidence of valvular vegetations on this transthoracic echocardiogram. Would recommend a transesophageal echocardiogram to exclude infective endocarditis if clinically indicated. FINDINGS  Left Ventricle: Left ventricular ejection fraction, by estimation, is 50 to 55%. The left ventricle has low normal function. The left ventricle has no regional wall motion abnormalities. The left ventricular internal cavity size was normal in size. There is moderate left ventricular hypertrophy. Incoordinate septal motion. Indeterminate diastolic filling due to E-A fusion. Right Ventricle: The right ventricular size is normal. No increase in right ventricular wall thickness. Right ventricular systolic function is normal. There is normal pulmonary artery systolic pressure. The tricuspid regurgitant velocity is 2.62 m/s, and  with an assumed right atrial pressure of 3 mmHg, the estimated right ventricular systolic pressure is [id number] mmHg. Left Atrium: Left atrial size was moderately dilated. Right Atrium: Right atrial size was normal in size. Pericardium: Trivial pericardial effusion is present. The pericardial effusion is posterior to the left ventricle. Mitral Valve: The mitral valve is grossly normal. Trivial mitral valve regurgitation. Tricuspid Valve: The tricuspid valve is grossly normal. Tricuspid valve regurgitation is mild. Aortic Valve: The aortic valve is tricuspid. Aortic valve regurgitation is not visualized. Mild aortic valve sclerosis is present, with no evidence of aortic valve stenosis. Aortic valve mean gradient measures 9.0 mmHg.  Aortic valve peak gradient measures 16.5 mmHg. Aortic valve area, by VTI measures 2.12 cm. Pulmonic Valve: The pulmonic valve was normal in structure. Pulmonic valve regurgitation is not visualized. Aorta: The aortic root and ascending aorta are structurally normal, with no evidence of dilitation. Venous:  The inferior vena cava is normal in size with greater than 50% respiratory variability, suggesting right atrial pressure of 3 mmHg. IAS/Shunts: No atrial level shunt detected by color flow Doppler. Additional Comments: There is a small pleural effusion in the left lateral region.  LEFT VENTRICLE PLAX 2D LVIDd:         4.10 cm LVIDs:         3.00 cm LV PW:         1.50 cm LV IVS:        1.40 cm LVOT diam:     2.10 cm LV SV:         66 LV SV Index:   37 LVOT Area:     3.46 cm  RIGHT VENTRICLE          IVC RV Basal diam:  3.40 cm  IVC diam: 1.60 cm LEFT ATRIUM             Index       RIGHT ATRIUM           Index LA diam:        2.60 cm 1.45 cm/m  RA Area:     17.80 cm LA Vol (A2C):   66.7 ml 37.19 ml/m RA Volume:   49.50 ml  27.60 ml/m LA Vol (A4C):   86.4 ml 48.17 ml/m LA Biplane Vol: 76.4 ml 42.60 ml/m  AORTIC VALVE AV Area (Vmax):    2.18 cm AV Area (Vmean):   2.03 cm AV Area (VTI):     2.12 cm AV Vmax:           203.00 cm/s AV Vmean:          145.000 cm/s AV VTI:            0.310 m AV Peak Grad:      16.5 mmHg AV Mean Grad:      9.0 mmHg LVOT Vmax:         128.00 cm/s LVOT Vmean:        84.900 cm/s LVOT VTI:          0.190 m LVOT/AV VTI ratio: 0.61  AORTA Ao Root diam: 3.00 cm Ao Asc diam:  3.20 cm TRICUSPID VALVE TR Peak grad:   27.5 mmHg TR Vmax:        262.00 cm/s  SHUNTS Systemic VTI:  0.19 m Systemic Diam: 2.10 cm Lyman Bishop MD Electronically signed by Lyman Bishop MD Signature Date/Time: 03/06/2020/3:22:26 PM    Final    VAS [place] LOWER EXTREMITY VENOUS (DVT)  Result Date: 03/07/2020  Lower Venous DVT Study Indications: Fever of unknown origin.  Comparison Study: No prior study Performing  Technologist: Sharion Dove RVS  Examination Guidelines: A complete evaluation includes B-mode imaging, spectral Doppler, color Doppler, and power Doppler as needed of all accessible portions of each vessel. Bilateral testing is considered an integral part of a complete examination. Limited examinations for reoccurring indications may be performed as noted. The reflux portion of the exam is performed with the patient in reverse Trendelenburg.  +---------+---------------+---------+-----------+----------+--------------+ RIGHT    CompressibilityPhasicitySpontaneityPropertiesThrombus Aging +---------+---------------+---------+-----------+----------+--------------+ CFV      Full           Yes      Yes                                 +---------+---------------+---------+-----------+----------+--------------+ SFJ      Full                                                        +---------+---------------+---------+-----------+----------+--------------+  FV Prox  Full                                                        +---------+---------------+---------+-----------+----------+--------------+ FV Mid   Full                                                        +---------+---------------+---------+-----------+----------+--------------+ FV DistalFull                                                        +---------+---------------+---------+-----------+----------+--------------+ PFV      Full                                                        +---------+---------------+---------+-----------+----------+--------------+ POP      Full           Yes      Yes                                 +---------+---------------+---------+-----------+----------+--------------+ PTV      Full                                                        +---------+---------------+---------+-----------+----------+--------------+ PERO     Full                                                         +---------+---------------+---------+-----------+----------+--------------+   +---------+---------------+---------+-----------+----------+--------------+ LEFT     CompressibilityPhasicitySpontaneityPropertiesThrombus Aging +---------+---------------+---------+-----------+----------+--------------+ CFV      Full           Yes      Yes                                 +---------+---------------+---------+-----------+----------+--------------+ SFJ      Full                                                        +---------+---------------+---------+-----------+----------+--------------+ FV Prox  Full                                                        +---------+---------------+---------+-----------+----------+--------------+  FV Mid   Full                                                        +---------+---------------+---------+-----------+----------+--------------+ FV DistalFull                                                        +---------+---------------+---------+-----------+----------+--------------+ PFV      Full                                                        +---------+---------------+---------+-----------+----------+--------------+ POP      Full           Yes      Yes                                 +---------+---------------+---------+-----------+----------+--------------+ PTV      Full                                                        +---------+---------------+---------+-----------+----------+--------------+ PERO     Full                                                        +---------+---------------+---------+-----------+----------+--------------+     Summary: BILATERAL: - No evidence of deep vein thrombosis seen in the lower extremities, bilaterally. -No evidence of popliteal cyst, bilaterally.   *See table(s) above for measurements and observations. Electronically signed by Jamelle Haring on  03/07/2020 at 5:50:28 PM.    Final    VAS [place] UPPER EXT VEIN MAPPING (PRE-OP AVF)  Result Date: 03/10/2020 UPPER EXTREMITY VEIN MAPPING  Indications: Pre-access. Comparison Study: no prior Performing Technologist: Abram Sander RVS  Examination Guidelines: A complete evaluation includes B-mode imaging, spectral Doppler, color Doppler, and power Doppler as needed of all accessible portions of each vessel. Bilateral testing is considered an integral part of a complete examination. Limited examinations for reoccurring indications may be performed as noted. +-----------------+-------------+----------+--------------+ Right Cephalic   Diameter (cm)Depth (cm)   Findings    +-----------------+-------------+----------+--------------+ Shoulder             0.22        0.93                  +-----------------+-------------+----------+--------------+ Prox upper arm       0.19        0.57                  +-----------------+-------------+----------+--------------+ Mid upper arm        0.18        0.63                  +-----------------+-------------+----------+--------------+  Dist upper arm       0.28        0.52                  +-----------------+-------------+----------+--------------+ Antecubital fossa    0.27        0.34                  +-----------------+-------------+----------+--------------+ Prox forearm         0.25        0.47     branching    +-----------------+-------------+----------+--------------+ Mid forearm                             not visualized +-----------------+-------------+----------+--------------+ Dist forearm                            not visualized +-----------------+-------------+----------+--------------+ Wrist                                   not visualized +-----------------+-------------+----------+--------------+ +-----------------+-------------+----------+---------+ Right Basilic    Diameter (cm)Depth (cm)Findings   +-----------------+-------------+----------+---------+ Prox upper arm       0.30        1.04             +-----------------+-------------+----------+---------+ Mid upper arm        0.26        1.35             +-----------------+-------------+----------+---------+ Dist upper arm       0.28        1.28             +-----------------+-------------+----------+---------+ Antecubital fossa    0.20        0.74   branching +-----------------+-------------+----------+---------+ Prox forearm         0.13        0.24             +-----------------+-------------+----------+---------+ Mid forearm          0.12        0.22             +-----------------+-------------+----------+---------+ Distal forearm       0.13        0.27             +-----------------+-------------+----------+---------+ +-----------------+-------------+----------+---------+ Left Cephalic    Diameter (cm)Depth (cm)Findings  +-----------------+-------------+----------+---------+ Shoulder             0.21        0.80             +-----------------+-------------+----------+---------+ Prox upper arm       0.21        0.66             +-----------------+-------------+----------+---------+ Mid upper arm        0.24        0.58             +-----------------+-------------+----------+---------+ Dist upper arm       0.23        0.51             +-----------------+-------------+----------+---------+ Antecubital fossa    0.40        0.21   branching +-----------------+-------------+----------+---------+ Prox forearm         0.22        0.34             +-----------------+-------------+----------+---------+  Mid forearm          0.27        0.46             +-----------------+-------------+----------+---------+ Dist forearm         0.25        0.47             +-----------------+-------------+----------+---------+ Wrist                0.23        0.29              +-----------------+-------------+----------+---------+ +-----------------+-------------+----------+--------------+ Left Basilic     Diameter (cm)Depth (cm)   Findings    +-----------------+-------------+----------+--------------+ Prox upper arm       0.24        1.03                  +-----------------+-------------+----------+--------------+ Mid upper arm        0.22        1.75                  +-----------------+-------------+----------+--------------+ Dist upper arm       0.20        1.37                  +-----------------+-------------+----------+--------------+ Antecubital fossa    0.22        0.83                  +-----------------+-------------+----------+--------------+ Prox forearm         0.16        0.60     branching    +-----------------+-------------+----------+--------------+ Mid forearm                             not visualized +-----------------+-------------+----------+--------------+ Distal forearm                          not visualized +-----------------+-------------+----------+--------------+ Elbow                                   not visualized +-----------------+-------------+----------+--------------+ *See table(s) above for measurements and observations.  Diagnosing physician: Deitra Mayo MD Electronically signed by Deitra Mayo MD on 03/10/2020 at 5:00:01 PM.    Final    VAS [place] UPPER EXTREMITY VENOUS DUPLEX  Result Date: 03/07/2020 UPPER VENOUS STUDY  Indications: fever of unknown origin Limitations: Bandage at right neck. Comparison Study: No prior study Performing Technologist: Sharion Dove RVS  Examination Guidelines: A complete evaluation includes B-mode imaging, spectral Doppler, color Doppler, and power Doppler as needed of all accessible portions of each vessel. Bilateral testing is considered an integral part of a complete examination. Limited examinations for reoccurring indications may be performed as noted.   Right Findings: +----------+------------+---------+-----------+----------+-------+ RIGHT     CompressiblePhasicitySpontaneousPropertiesSummary +----------+------------+---------+-----------+----------+-------+ IJV                      Yes       Yes                      +----------+------------+---------+-----------+----------+-------+ Subclavian               Yes       Yes                      +----------+------------+---------+-----------+----------+-------+  Axillary                 Yes       Yes                      +----------+------------+---------+-----------+----------+-------+ Brachial      Full       Yes       Yes                      +----------+------------+---------+-----------+----------+-------+ Radial        Full                                          +----------+------------+---------+-----------+----------+-------+ Ulnar         Full                                          +----------+------------+---------+-----------+----------+-------+ Cephalic      Full                                          +----------+------------+---------+-----------+----------+-------+ Basilic       Full                                          +----------+------------+---------+-----------+----------+-------+  Left Findings: +----------+------------+---------+-----------+----------+-------+ LEFT      CompressiblePhasicitySpontaneousPropertiesSummary +----------+------------+---------+-----------+----------+-------+ IJV           Full       Yes       Yes                      +----------+------------+---------+-----------+----------+-------+ Subclavian               Yes       Yes                      +----------+------------+---------+-----------+----------+-------+ Axillary                 Yes       Yes                      +----------+------------+---------+-----------+----------+-------+ Brachial      Full       Yes        Yes                      +----------+------------+---------+-----------+----------+-------+ Radial        Full                                          +----------+------------+---------+-----------+----------+-------+ Ulnar         Full                                          +----------+------------+---------+-----------+----------+-------+ Cephalic      Full                                          +----------+------------+---------+-----------+----------+-------+  Summary:  Right: No evidence of deep vein thrombosis in the upper extremity. No evidence of superficial vein thrombosis in the upper extremity.  Left: No evidence of deep vein thrombosis in the upper extremity. No evidence of superficial vein thrombosis in the upper extremity.  *See table(s) above for measurements and observations.  Diagnosing physician: Jamelle Haring Electronically signed by Jamelle Haring on 03/07/2020 at 5:50:10 PM.    Final     Microbiology: No results found for this or any previous visit (from the past 240 hour(s)).   Labs: Basic Metabolic Panel: Recent Labs  Lab 03/31/20 0651 03/31/20 1120 04/01/20 0342 04/02/20 0154 04/02/20 1125 04/03/20 0132 04/04/20 0114  NA 131*  --  135 134*  --  135 131*  K 4.7  --  4.7 4.9  --  4.8 4.8  CL 95*  --  99 98  --  98 95*  CO2 26  --  28 26  --  29 26  GLUCOSE 87  --  89 101*  --  82 80  BUN 46*   < > 24* 42* 8 26* 39*  CREATININE 9.09*  --  5.64* 7.98*  --  5.51* 7.91*  CALCIUM 8.6*  --  8.6* 8.8*  --  8.8* 8.7*  PHOS 4.6  --   --  3.0  --  2.8 3.5   < > = values in this interval not displayed.   Liver Function Tests: Recent Labs  Lab 03/31/20 0651 04/02/20 0154 04/03/20 0132 04/04/20 0114  ALBUMIN 2.3* 2.4* 2.4* 2.4*   No results for input(s): LIPASE, AMYLASE in the last 168 hours. No results for input(s): AMMONIA in the last 168 hours. CBC: Recent Labs  Lab 03/31/20 0651 04/01/20 0342 04/02/20 0154 04/03/20 0132  04/04/20 0114  WBC 6.4 6.8 7.8 6.9 7.0  NEUTROABS  --  4.6 4.9 3.9 3.9  HGB 9.0* 8.5* 8.6* 8.6* 8.5*  HCT 28.7* 26.0* 28.1* 27.5* 27.1*  MCV 94.1 93.2 97.6 97.5 97.1  PLT 143* 132* 150 131* 161   Cardiac Enzymes: No results for input(s): CKTOTAL, CKMB, CKMBINDEX, TROPONINI in the last 168 hours. BNP: BNP (last 3 results) Recent Labs    02/25/20 2045  BNP 72.1    ProBNP (last 3 results) No results for input(s): PROBNP in the last 8760 hours.  CBG: Recent Labs  Lab 04/01/20 0751 04/01/20 1213 04/01/20 1616 04/01/20 1956 04/02/20 0629  GLUCAP 86 102* 125* 118* 87       Signed:  Alma Friendly, MD [facility] 04/04/2020, 11:28 AM

## 2020-04-16 ENCOUNTER — Telehealth: Payer: Self-pay

## 2020-04-16 NOTE — Telephone Encounter (Signed)
Multiple unsuccessful attempts made to reach pt to schedule left arm AVG placement with Dr. Carlis Abbott or Dr. Stanford Breed. Will mail pt letter requesting to contact office to schedule surgery.

## 2020-04-21 ENCOUNTER — Ambulatory Visit (INDEPENDENT_AMBULATORY_CARE_PROVIDER_SITE_OTHER): Payer: BLUE CROSS/BLUE SHIELD | Admitting: Physician Assistant

## 2020-04-21 ENCOUNTER — Encounter (HOSPITAL_COMMUNITY): Payer: Self-pay

## 2020-04-21 ENCOUNTER — Other Ambulatory Visit: Payer: Self-pay

## 2020-04-21 ENCOUNTER — Ambulatory Visit (HOSPITAL_COMMUNITY)
Admit: 2020-04-21 | Discharge: 2020-04-21 | Disposition: A | Discharge status: Home / Self Care | Payer: BLUE CROSS/BLUE SHIELD | Attending: Vascular Surgery | Admitting: Vascular Surgery

## 2020-04-21 VITALS — BP 122/80 | HR 78 | Temp 98.2°F | Resp 20 | Ht 62.0 in | Wt 130.4 lb

## 2020-04-21 DIAGNOSIS — N186 End stage renal disease: Secondary | ICD-10-CM

## 2020-04-21 DIAGNOSIS — N178 Other acute kidney failure: Secondary | ICD-10-CM

## 2020-04-21 DIAGNOSIS — Z992 Dependence on renal dialysis: Secondary | ICD-10-CM

## 2020-04-21 NOTE — Progress Notes (Signed)
Established Dialysis Access   History of Present Illness   Raziel Liberman is a 65 y.o. (March 02, 1955) female who presents for re-evaluation for permanent access.  She is s/p left brachiocephalic AV fistula with thrombectomy of cephalic vein by Dr. Carlis Abbott on 03/13/20.  Unfortunately the fistula was found to be thrombosed on post op follow up. The patient is right hand dominant.   Her previous vein mapping shows inadequate veins for further AV fistula options. It was discussed with her that she will need an AV graft.  She presents today with her sister to discuss surgical planning for AV graft.   She currently is dialyzing via right IJ [place] on Tues/ Thurs/ Sat. This is presently working well  Current Outpatient Medications  Medication Sig Dispense Refill  . acetaminophen (TYLENOL) 650 MG CR tablet Take 650 mg by mouth as needed for pain.    Marland Kitchen aspirin EC 81 MG tablet Take 81 mg by mouth daily.    Marland Kitchen HYDROcodone-acetaminophen (NORCO/VICODIN) 5-325 MG tablet Take 1 tablet by mouth 3 (three) times daily as needed for severe pain.    . Metoprolol Tartrate 75 MG TABS Take 75 mg by mouth 2 (two) times daily. 60 tablet 0  . multivitamin (RENA-VIT) TABS tablet Take 1 tablet by mouth at bedtime. 30 tablet 0  . NIFEdipine (ADALAT CC) 30 MG 24 hr tablet Take 1 tablet (30 mg total) by mouth daily. 30 tablet 0  . levETIRAcetam (KEPPRA) 250 MG tablet Take 1 tablet (250 mg total) by mouth Every Tuesday,Thursday,and Saturday with dialysis. (Patient not taking: Reported on 04/21/2020) 30 tablet 0  . levETIRAcetam (KEPPRA) 500 MG tablet Take 1 tablet (500 mg total) by mouth daily. (Patient not taking: Reported on 04/21/2020) 30 tablet 0   No current facility-administered medications for this visit.    On ROS today: negative unless stated in HPI   Physical Examination   Vitals:   04/21/20 1423  BP: 122/80  Pulse: 78  Resp: 20  Temp: 98.2 F (36.8 C)  TempSrc: Temporal   SpO2: 99%  Weight: 130 lb 6.4 oz (59.1 kg)  Height: '5\' 2"'$  (1.575 m)   Body mass index is 23.85 kg/m.  General: WDWN in no distress Cardiac: regular Lungs: non labored Extremities: left upper extremity AC incision is well healed. Left radial pulse 2+. Left hand warm. 5/5 grip strength. Left AV fistula occluded. No palpable thrill. Right radial pulse 2+  Non-invasive Vascular Imaging   left Arm Access Duplex  (03/29/20):  Findings:  +--------------------+----------+-----------------+--------+  AVF         PSV (cm/s)Flow Vol (mL/min)Comments  +--------------------+----------+-----------------+--------+  Native artery inflow  62                 +--------------------+----------+-----------------+--------+    +--------------------+----------+-----------------+--------+  AVG         PSV (cm/s)Flow Vol (mL/min)Describe  +--------------------+----------+-----------------+--------+  Native artery inflow  62                 +--------------------+----------+-----------------+--------+  Arterial anastomosis              occluded  +--------------------+----------+-----------------+--------+  Prox graft                   occluded  +--------------------+----------+-----------------+--------+  Mid graft                    occluded  +--------------------+----------+-----------------+--------+  Distal graft  occluded  +--------------------+----------+-----------------+--------+  Venous anastomosis               occluded  +--------------------+----------+-----------------+--------+   BUE Vein Mapping  (03/10/20):  Inadequate veins in bilateral upper extremities   Medical Decision Making    Bethe Paduano is a 65 y.o. female who presents with ESRD requiring hemodialysis. She is presently dialyzing  via a right IJ TDC. Her previously placed left brachiocephalic AV fistula is thrombosed. She has no adequate veins for fistula creation in either upper extremity. She is right handed and had no issues with signs or symptoms of steal while fistula was working. Will get her scheduled for left upper extremity AV graft with Dr. Carlis Abbott. Discussed with her that we will try to work around her dialysis schedule but this may need to be changed to accommodate surgery scheduling. Risk, benefits, and alternatives to access surgery were discussed.  The patient is aware the risks include but are not limited to: bleeding, infection, steal syndrome, nerve damage, thrombosis, failure to mature, and need for additional procedures. The patient and her sister present at visit today agree to proceed with the procedure.    Karoline Caldwell, PA-C [facility] Office: [phone]  Clinic MD: Stanford Breed

## 2020-04-29 ENCOUNTER — Other Ambulatory Visit (HOSPITAL_COMMUNITY): Payer: BLUE CROSS/BLUE SHIELD

## 2020-04-30 NOTE — Progress Notes (Signed)
Per patient, surgery is canceled for 05/01/20 due to insurance.  Spoke with Dr. Ainsley Spinner office and they are aware of procedure being canceled.

## 2020-05-01 ENCOUNTER — Ambulatory Visit (HOSPITAL_COMMUNITY)
Admission: RE | Admit: 2020-05-01 | Payer: BLUE CROSS/BLUE SHIELD | Source: Home / Self Care | Admitting: Vascular Surgery

## 2020-05-01 ENCOUNTER — Encounter (HOSPITAL_COMMUNITY): Admission: RE | Payer: Self-pay | Source: Home / Self Care

## 2020-05-01 SURGERY — INSERTION OF ARTERIOVENOUS (AV) GORE-TEX GRAFT ARM
Anesthesia: Choice | Laterality: Left

## 2020-08-15 ENCOUNTER — Inpatient Hospital Stay (HOSPITAL_COMMUNITY)
Admission: EM | Admit: 2020-08-15 | Discharge: 2020-08-18 | DRG: 291 | Disposition: A | Discharge status: Home Health | Payer: Medicare Other | Attending: Internal Medicine | Admitting: Internal Medicine

## 2020-08-15 ENCOUNTER — Emergency Department (HOSPITAL_COMMUNITY): Payer: Medicare Other

## 2020-08-15 DIAGNOSIS — Z7982 Long term (current) use of aspirin: Secondary | ICD-10-CM

## 2020-08-15 DIAGNOSIS — J9601 Acute respiratory failure with hypoxia: Secondary | ICD-10-CM | POA: Diagnosis present

## 2020-08-15 DIAGNOSIS — G9341 Metabolic encephalopathy: Secondary | ICD-10-CM | POA: Diagnosis present

## 2020-08-15 DIAGNOSIS — Z881 Allergy status to other antibiotic agents status: Secondary | ICD-10-CM | POA: Diagnosis not present

## 2020-08-15 DIAGNOSIS — E162 Hypoglycemia, unspecified: Secondary | ICD-10-CM | POA: Diagnosis not present

## 2020-08-15 DIAGNOSIS — E874 Mixed disorder of acid-base balance: Secondary | ICD-10-CM | POA: Diagnosis not present

## 2020-08-15 DIAGNOSIS — Z8616 Personal history of COVID-19: Secondary | ICD-10-CM

## 2020-08-15 DIAGNOSIS — Z20822 Contact with and (suspected) exposure to covid-19: Secondary | ICD-10-CM | POA: Diagnosis present

## 2020-08-15 DIAGNOSIS — I132 Hypertensive heart and chronic kidney disease with heart failure and with stage 5 chronic kidney disease, or end stage renal disease: Principal | ICD-10-CM | POA: Diagnosis present

## 2020-08-15 DIAGNOSIS — Z992 Dependence on renal dialysis: Secondary | ICD-10-CM | POA: Diagnosis not present

## 2020-08-15 DIAGNOSIS — N186 End stage renal disease: Secondary | ICD-10-CM | POA: Diagnosis present

## 2020-08-15 DIAGNOSIS — Z91041 Radiographic dye allergy status: Secondary | ICD-10-CM

## 2020-08-15 DIAGNOSIS — J9602 Acute respiratory failure with hypercapnia: Secondary | ICD-10-CM | POA: Diagnosis present

## 2020-08-15 DIAGNOSIS — Z880 Allergy status to penicillin: Secondary | ICD-10-CM

## 2020-08-15 DIAGNOSIS — G40909 Epilepsy, unspecified, not intractable, without status epilepticus: Secondary | ICD-10-CM | POA: Diagnosis present

## 2020-08-15 DIAGNOSIS — J810 Acute pulmonary edema: Secondary | ICD-10-CM

## 2020-08-15 DIAGNOSIS — I5033 Acute on chronic diastolic (congestive) heart failure: Secondary | ICD-10-CM | POA: Diagnosis present

## 2020-08-15 DIAGNOSIS — Z888 Allergy status to other drugs, medicaments and biological substances status: Secondary | ICD-10-CM | POA: Diagnosis not present

## 2020-08-15 DIAGNOSIS — R0602 Shortness of breath: Secondary | ICD-10-CM | POA: Diagnosis present

## 2020-08-15 DIAGNOSIS — Z91013 Allergy to seafood: Secondary | ICD-10-CM

## 2020-08-15 DIAGNOSIS — I953 Hypotension of hemodialysis: Secondary | ICD-10-CM | POA: Diagnosis not present

## 2020-08-15 DIAGNOSIS — E876 Hypokalemia: Secondary | ICD-10-CM | POA: Diagnosis present

## 2020-08-15 DIAGNOSIS — Z79899 Other long term (current) drug therapy: Secondary | ICD-10-CM | POA: Diagnosis not present

## 2020-08-15 DIAGNOSIS — J9600 Acute respiratory failure, unspecified whether with hypoxia or hypercapnia: Secondary | ICD-10-CM

## 2020-08-15 DIAGNOSIS — E8770 Fluid overload, unspecified: Secondary | ICD-10-CM | POA: Diagnosis not present

## 2020-08-15 DIAGNOSIS — J9690 Respiratory failure, unspecified, unspecified whether with hypoxia or hypercapnia: Secondary | ICD-10-CM | POA: Diagnosis present

## 2020-08-15 LAB — CBC
HCT: 34.5 % — ABNORMAL LOW (ref 36.0–46.0)
Hemoglobin: 10.4 g/dL — ABNORMAL LOW (ref 12.0–15.0)
MCH: 29 pg (ref 26.0–34.0)
MCHC: 30.1 g/dL (ref 30.0–36.0)
MCV: 96.1 fL (ref 80.0–100.0)
Platelets: 113 10*3/uL — ABNORMAL LOW (ref 150–400)
RBC: 3.59 MIL/uL — ABNORMAL LOW (ref 3.87–5.11)
RDW: 14.4 % (ref 11.5–15.5)
WBC: 5.9 10*3/uL (ref 4.0–10.5)
nRBC: 0 % (ref 0.0–0.2)

## 2020-08-15 LAB — I-STAT ARTERIAL BLOOD GAS, ED
Acid-Base Excess: 3 mmol/L — ABNORMAL HIGH (ref 0.0–2.0)
Acid-base deficit: 4 mmol/L — ABNORMAL HIGH (ref 0.0–2.0)
Bicarbonate: 27.3 mmol/L (ref 20.0–28.0)
Bicarbonate: 27 mmol/L (ref 20.0–28.0)
Calcium, Ion: 1.16 mmol/L (ref 1.15–1.40)
Calcium, Ion: 1.05 mmol/L — ABNORMAL LOW (ref 1.15–1.40)
HCT: 37 % (ref 36.0–46.0)
HCT: 30 % — ABNORMAL LOW (ref 36.0–46.0)
Hemoglobin: 12.6 g/dL (ref 12.0–15.0)
Hemoglobin: 10.2 g/dL — ABNORMAL LOW (ref 12.0–15.0)
O2 Saturation: 40 %
O2 Saturation: 97 %
Potassium: 3.7 mmol/L (ref 3.5–5.1)
Potassium: 2.6 mmol/L — CL (ref 3.5–5.1)
Sodium: 142 mmol/L (ref 135–145)
Sodium: 142 mmol/L (ref 135–145)
TCO2: 30 mmol/L (ref 22–32)
TCO2: 28 mmol/L (ref 22–32)
pCO2 arterial: 87.7 mmHg (ref 32.0–48.0)
pCO2 arterial: 38.6 mmHg (ref 32.0–48.0)
pH, Arterial: 7.1 — CL (ref 7.350–7.450)
pH, Arterial: 7.452 — ABNORMAL HIGH (ref 7.350–7.450)
pO2, Arterial: 32 mmHg — CL (ref 83.0–108.0)
pO2, Arterial: 86 mmHg (ref 83.0–108.0)

## 2020-08-15 LAB — HEMOGLOBIN A1C
Hgb A1c MFr Bld: 5.4 % (ref 4.8–5.6)
Mean Plasma Glucose: 108.28 mg/dL

## 2020-08-15 LAB — BASIC METABOLIC PANEL
Anion gap: 14 (ref 5–15)
Anion gap: 12 (ref 5–15)
BUN: 13 mg/dL (ref 8–23)
BUN: 15 mg/dL (ref 8–23)
CO2: 21 mmol/L — ABNORMAL LOW (ref 22–32)
CO2: 24 mmol/L (ref 22–32)
Calcium: 7.9 mg/dL — ABNORMAL LOW (ref 8.9–10.3)
Calcium: 7.8 mg/dL — ABNORMAL LOW (ref 8.9–10.3)
Chloride: 103 mmol/L (ref 98–111)
Chloride: 103 mmol/L (ref 98–111)
Creatinine, Ser: 4.67 mg/dL — ABNORMAL HIGH (ref 0.44–1.00)
Creatinine, Ser: 5.04 mg/dL — ABNORMAL HIGH (ref 0.44–1.00)
GFR, Estimated: 10 mL/min — ABNORMAL LOW (ref >60)
GFR, Estimated: 9 mL/min — ABNORMAL LOW (ref >60)
Glucose, Bld: 195 mg/dL — ABNORMAL HIGH (ref 70–99)
Glucose, Bld: 73 mg/dL (ref 70–99)
Potassium: 2.4 mmol/L — CL (ref 3.5–5.1)
Potassium: 3.1 mmol/L — ABNORMAL LOW (ref 3.5–5.1)
Sodium: 138 mmol/L (ref 135–145)
Sodium: 139 mmol/L (ref 135–145)

## 2020-08-15 LAB — RESP PANEL BY RT-PCR (FLU A&B, COVID) ARPGX2
Influenza A by PCR: NEGATIVE
Influenza B by PCR: NEGATIVE
SARS Coronavirus 2 by RT PCR: NEGATIVE

## 2020-08-15 LAB — MRSA NEXT GEN BY PCR, NASAL: MRSA by PCR Next Gen: NOT DETECTED

## 2020-08-15 LAB — MAGNESIUM: Magnesium: 1.6 mg/dL — ABNORMAL LOW (ref 1.7–2.4)

## 2020-08-15 LAB — TROPONIN I (HIGH SENSITIVITY)
Troponin I (High Sensitivity): 33 ng/L — ABNORMAL HIGH (ref <18)
Troponin I (High Sensitivity): 96 ng/L — ABNORMAL HIGH (ref <18)

## 2020-08-15 LAB — GLUCOSE, CAPILLARY
Glucose-Capillary: 66 mg/dL — ABNORMAL LOW (ref 70–99)
Glucose-Capillary: 87 mg/dL (ref 70–99)

## 2020-08-15 LAB — PHOSPHORUS: Phosphorus: 2.9 mg/dL (ref 2.5–4.6)

## 2020-08-15 IMAGING — DX DG CHEST 1V PORT
1 series · 1 of 1 positions shown · non-contrast
Comparison: [DATE]

CLINICAL DATA: Post intubation

EXAM:
PORTABLE CHEST 1 VIEW

[chest ap]
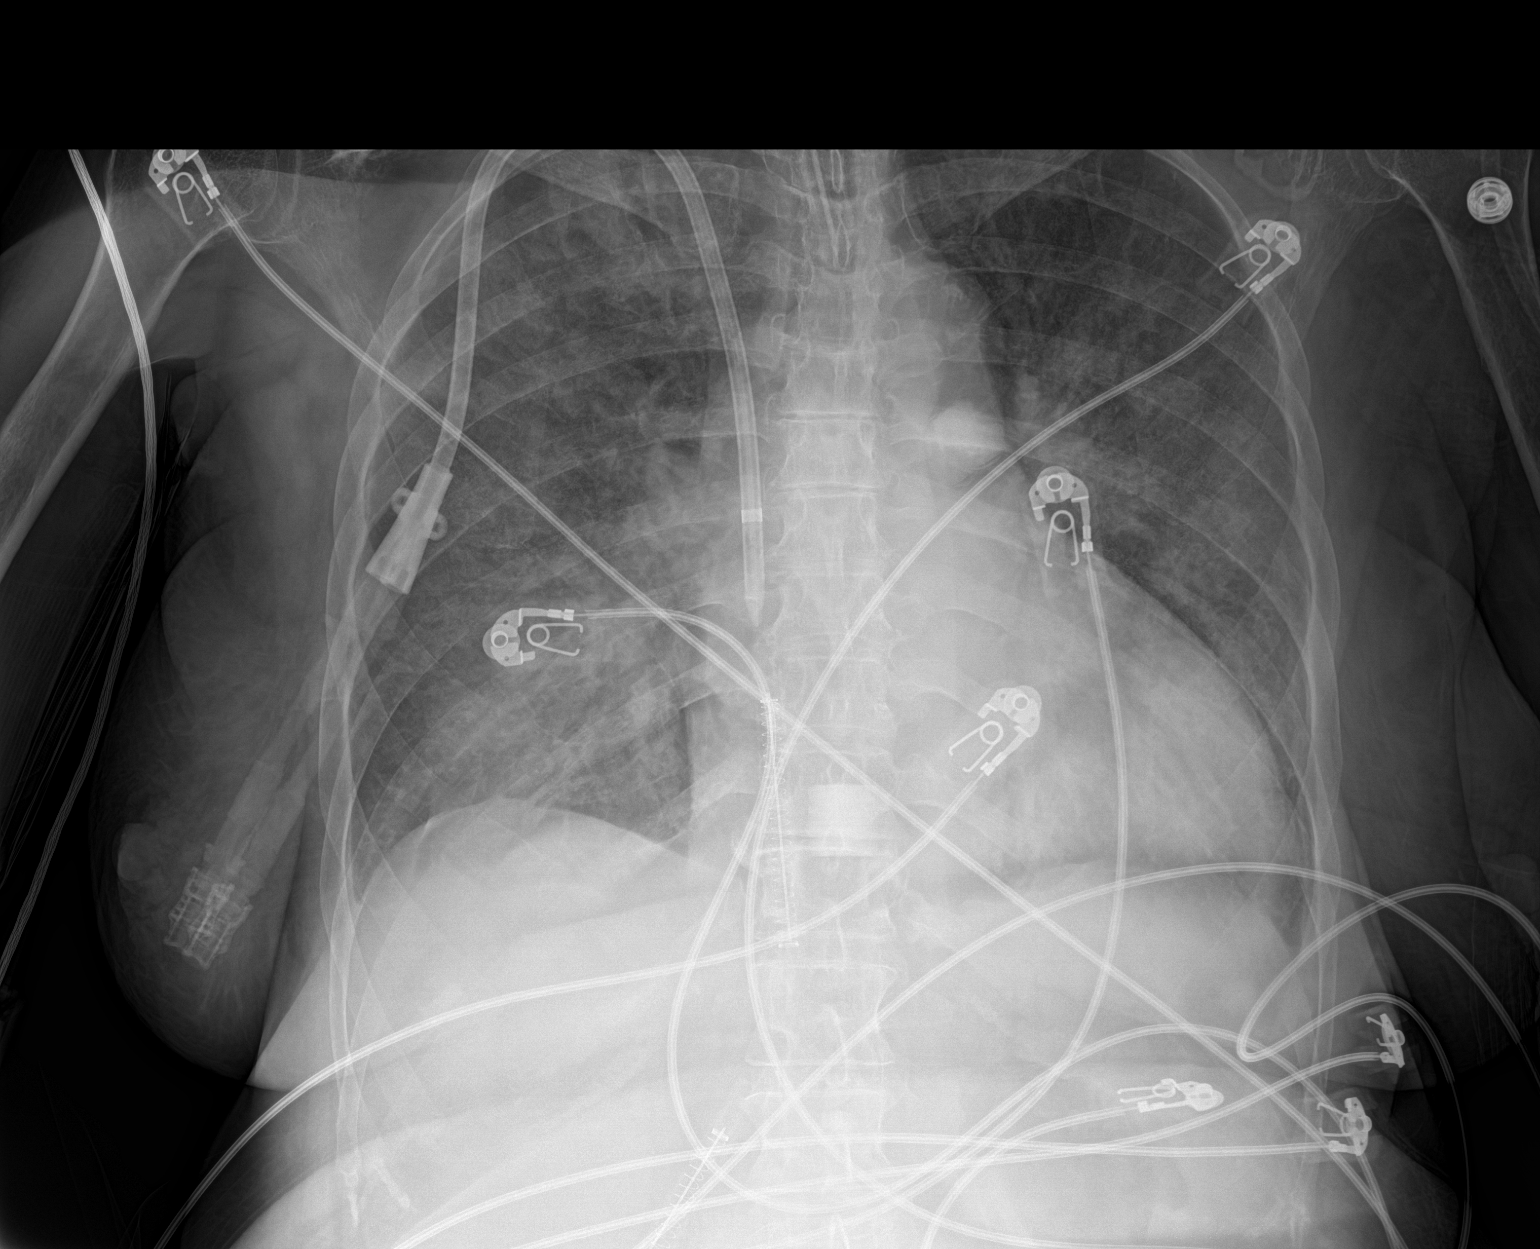

[1 of 1 positions shown; findings below may reference images not displayed]

FINDINGS: A double lumen catheter consistent with a dialysis catheter is
stable terminating in the central SVC. No pneumothorax. Diffuse
bilateral pulmonary opacities. Mild cardiomegaly. The ETT is in good
position. No other acute abnormalities.
IMPRESSION: Findings are most consistent with cardiomegaly and developing
pulmonary edema. A diffuse infectious process could have a similar
appearance in the appropriate clinical setting.

## 2020-08-15 MED ORDER — FENTANYL CITRATE PF 50 MCG/ML IJ SOSY
50.0000 ug | PREFILLED_SYRINGE | Freq: Once | INTRAMUSCULAR | Status: DC
  Filled 2020-08-15: qty 1

## 2020-08-15 MED ORDER — SODIUM CHLORIDE 0.9 % IV SOLN
INTRAVENOUS | Status: AC | PRN
  Administered 2020-08-15: 1000 mL via INTRAVENOUS

## 2020-08-15 MED ORDER — INSULIN ASPART 100 UNIT/ML IJ SOLN: 0.0000 [IU] | INTRAMUSCULAR | Status: DC

## 2020-08-15 MED ORDER — CHLORHEXIDINE GLUCONATE CLOTH 2 % EX PADS: 6.0000 | MEDICATED_PAD | Freq: Every day | CUTANEOUS | Status: DC

## 2020-08-15 MED ORDER — DOCUSATE SODIUM 50 MG/5ML PO LIQD: 100.0000 mg | Freq: Two times a day (BID) | ORAL | Status: DC | PRN

## 2020-08-15 MED ORDER — FENTANYL BOLUS VIA INFUSION
50.0000 ug | INTRAVENOUS | Status: DC | PRN
Start: 2020-08-15 — End: 2020-08-16
  Filled 2020-08-15: qty 50

## 2020-08-15 MED ORDER — ETOMIDATE 2 MG/ML IV SOLN
INTRAVENOUS | Status: AC | PRN
  Administered 2020-08-15: 20 mg via INTRAVENOUS

## 2020-08-15 MED ORDER — METOPROLOL TARTRATE 25 MG PO TABS
25.0000 mg | ORAL_TABLET | Freq: Two times a day (BID) | ORAL | Status: DC
  Administered 2020-08-15: 25 mg
  Filled 2020-08-15: qty 1

## 2020-08-15 MED ORDER — FENTANYL 2500MCG IN NS 250ML (10MCG/ML) PREMIX INFUSION
25.0000 ug/h | INTRAVENOUS | Status: DC
  Administered 2020-08-15: 25 ug/h via INTRAVENOUS
  Administered 2020-08-15 (×2): 50 ug/h via INTRAVENOUS
  Filled 2020-08-15: qty 250

## 2020-08-15 MED ORDER — FENTANYL CITRATE (PF) 100 MCG/2ML IJ SOLN
INTRAMUSCULAR | Status: AC | PRN
  Administered 2020-08-15: 50 ug via INTRAVENOUS

## 2020-08-15 MED ORDER — PANTOPRAZOLE SODIUM 40 MG IV SOLR
40.0000 mg | Freq: Every day | INTRAVENOUS | Status: DC
  Administered 2020-08-15: 40 mg via INTRAVENOUS
  Filled 2020-08-15: qty 40

## 2020-08-15 MED ORDER — POTASSIUM CHLORIDE 20 MEQ PO PACK
40.0000 meq | PACK | Freq: Once | ORAL | Status: AC
  Administered 2020-08-15: 40 meq
  Filled 2020-08-15: qty 2

## 2020-08-15 MED ORDER — NITROGLYCERIN IN D5W 200-5 MCG/ML-% IV SOLN
0.0000 ug/min | INTRAVENOUS | Status: DC
Start: 2020-08-15 — End: 2020-08-16

## 2020-08-15 MED ORDER — DEXTROSE 50 % IV SOLN
12.5000 g | INTRAVENOUS | Status: AC
  Administered 2020-08-15: 12.5 g via INTRAVENOUS

## 2020-08-15 MED ORDER — METOPROLOL TARTRATE 75 MG PO TABS: 25.0000 mg | ORAL_TABLET | Freq: Two times a day (BID) | ORAL | Status: DC

## 2020-08-15 MED ORDER — PROPOFOL 1000 MG/100ML IV EMUL
0.0000 ug/kg/min | INTRAVENOUS | Status: DC
  Administered 2020-08-15: 10 ug/kg/min via INTRAVENOUS
  Administered 2020-08-15 (×2): 50 ug/kg/min via INTRAVENOUS
  Administered 2020-08-15: 20 ug/kg/min via INTRAVENOUS
  Administered 2020-08-15: 50 ug/kg/min via INTRAVENOUS
  Administered 2020-08-15: 30 ug/kg/min via INTRAVENOUS
  Administered 2020-08-16: 20 ug/kg/min via INTRAVENOUS
  Filled 2020-08-15 (×2): qty 100

## 2020-08-15 MED ORDER — ACETAMINOPHEN 500 MG PO TABS: 500.0000 mg | ORAL_TABLET | Freq: Four times a day (QID) | ORAL | Status: DC | PRN

## 2020-08-15 MED ORDER — LEVETIRACETAM 100 MG/ML PO SOLN
500.0000 mg | Freq: Every day | ORAL | Status: DC
  Administered 2020-08-15 – 2020-08-16 (×2): 500 mg
  Filled 2020-08-15 (×3): qty 5

## 2020-08-15 MED ORDER — ROCURONIUM BROMIDE 50 MG/5ML IV SOLN
INTRAVENOUS | Status: AC | PRN
  Administered 2020-08-15: 70 mg via INTRAVENOUS

## 2020-08-15 MED ORDER — DEXTROSE 50 % IV SOLN
INTRAVENOUS | Status: AC
  Filled 2020-08-15: qty 50

## 2020-08-15 MED ORDER — HEPARIN SODIUM (PORCINE) 1000 UNIT/ML IJ SOLN
INTRAMUSCULAR | Status: AC
  Administered 2020-08-16: 2000 [IU]
  Filled 2020-08-15: qty 4

## 2020-08-15 MED ORDER — POTASSIUM CHLORIDE 10 MEQ/100ML IV SOLN
10.0000 meq | Freq: Once | INTRAVENOUS | Status: AC
  Administered 2020-08-15: 10 meq via INTRAVENOUS
  Filled 2020-08-15: qty 100

## 2020-08-15 MED ORDER — POLYETHYLENE GLYCOL 3350 17 G PO PACK: 17.0000 g | PACK | Freq: Every day | ORAL | Status: DC | PRN

## 2020-08-15 NOTE — ED Notes (Signed)
Dr Tomi Bamberger notified of k+ 2.4

## 2020-08-15 NOTE — Code Documentation (Signed)
Continues on NRB mask, Dr Tomi Bamberger at bedside preparing to intubate.

## 2020-08-15 NOTE — ED Notes (Signed)
Pair pink metal hoop earrings and mobile phone in black case given to daughter. Dr Tomi Bamberger notified daughter at bedside.

## 2020-08-15 NOTE — Consult Note (Signed)
Renal Service Consult Note [facility]  Barbara Crawford 08/15/2020 [name], MD Requesting Physician: Dr. Elsworth Soho  Reason for Consult: ESRD pt w/ resp failure HPI: The patient is a 65 y.o. 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 In ED was not not tolerating NRB and was combative so patient was intubated.  CXR showed pulm edema. BP's high in ED, K+ low.  Asked to see for renal failure.   Pt seen in ED.  No hx , pt on vent and sedated.     ROS - n/a  Past Medical History  Past Medical History:  Diagnosis Date   Hypertension    Past Surgical History  Past Surgical History:  Procedure Laterality Date   AV FISTULA PLACEMENT Left 03/13/2020   Procedure: Creation of Left Brachiocephalic fistula;  Surgeon: Marty Heck, MD;  Location: [place];  Service: Vascular;  Laterality: Left;   IR FLUORO GUIDE CV LINE RIGHT  02/26/2020   IR FLUORO GUIDE CV LINE RIGHT  03/09/2020   IR US GUIDE VASC ACCESS RIGHT  02/26/2020   IR US GUIDE VASC ACCESS RIGHT  03/09/2020   Family History No family history on file. Social History  reports that she has never smoked. She has never used smokeless tobacco. She reports current alcohol use. She reports that she does not use drugs. Allergies  Allergies  Allergen Reactions   Shellfish-Derived Products Swelling and Other (See Comments)    Sweat and Dizzy   Erythromycin Other (See Comments)    Pt does not recall   Iodine Other (See Comments)    Arm flares up per pt   Penicillins     Has patient had a PCN reaction causing immediate rash, facial/tongue/throat swelling, SOB or lightheadedness with hypotension: no Has patient had a PCN reaction causing severe rash involving mucus membranes or skin necrosis: yes Has patient had a PCN reaction that required hospitalization: no Has patient had a PCN reaction occurring within the last 10 years:  no If all of the above answers are "NO", then may proceed with Cephalosporin use.   Chlorhexidine Rash   Home medications Prior to Admission medications   Medication Sig Start Date End Date Taking? Authorizing Provider  acetaminophen (TYLENOL) 500 MG tablet Take 500 mg by mouth every 6 (six) hours as needed for mild pain or moderate pain.   Yes [provider]  aspirin EC 81 MG tablet Take 81 mg by mouth daily. Chewable   Yes [provider]  Metoprolol Tartrate 75 MG TABS Take 75 mg by mouth 2 (two) times daily. 04/04/20 08/15/20 Yes Alma Friendly, MD  NIFEdipine (ADALAT CC) 30 MG 24 hr tablet Take 1 tablet (30 mg total) by mouth daily. 04/04/20 08/15/20 Yes Alma Friendly, MD  levETIRAcetam (KEPPRA) 250 MG tablet Take 1 tablet (250 mg total) by mouth Every Tuesday,Thursday,and Saturday with dialysis. Patient not taking: Reported on 04/27/2020 04/04/20   Alma Friendly, MD  levETIRAcetam (KEPPRA) 500 MG tablet Take 1 tablet (500 mg total) by mouth daily. Patient not taking: Reported on 04/27/2020 04/04/20   Alma Friendly, MD     Vitals:   08/15/20 1537 08/15/20 1600 08/15/20 1630 08/15/20 1700  BP: 122/81 (!) 162/83 (!) 172/74 (!) 201/102  Pulse: 84 73 70 (!) 102  Resp:  11 14 (!) 25  Temp:  98.2 F (  36.8 C)    TempSrc:  Oral    SpO2: 100% 100%  100%  Weight:      Height:       Exam Gen on vent, sedated No rash, cyanosis or gangrene Sclera anicteric, throat w/ ETT No jvd or bruits Chest clear anterior/ lateral RRR no MRG Abd soft ntnd no mass or ascites +bs GU normal MS no joint effusions or deformity Ext trace pretib edema bilat, no hip or UE edema, no wounds or ulcers Neuro is on vent, sedated   LUA AVF+bruit/ RIJ TDC        Home meds - nifedipine, keppra, asa, metoprolol bid     Na 138  K 2.4  CO2 21  BUN 13  Cr 4.67   wBC 5K Hb 10     OP HD: pending   Assessment/ Plan: Acute resp failure/ pulm edema - suspect vol overload +/-  HTN crisis. Intubated in ED.  Will plan for HD tonight in ICU.   ESRD - on HD TTS, get records in am. Use tunneled HD cath.  HD as above.   HTN - per CCM  H/o COVID infection 12/2019 H/o seizures - on Keppra      Rob Ezrah Panning  MD 08/15/2020, 5:42 PM  Recent Labs  Lab 08/15/20 1520 08/15/20 1615  WBC 5.9  --   HGB 10.4* 10.2*   Recent Labs  Lab 08/15/20 1520 08/15/20 1615  K 2.4* 2.6*  BUN 13  --   CREATININE 4.67*  --   CALCIUM 7.9*  --

## 2020-08-15 NOTE — ED Notes (Signed)
Pulmonology Provider at bedside.

## 2020-08-15 NOTE — Progress Notes (Signed)
Pt transported on vent from ED to 3M13 without any complications, vitals stable, RN at bedside, RT will continue to monitor.

## 2020-08-15 NOTE — H&P (Signed)
NAME:  Barbara Crawford, MRN:  [id number], DOB:  1955/04/18, LOS: 0 ADMISSION DATE:  08/15/2020, CONSULTATION DATE:  08/15/2020  REFERRING MD:  Tomi Bamberger, EDP, CHIEF COMPLAINT: Respiratory distress, intubated  History of Present Illness:  65 year old woman with ESRD on HD brought in by EMS for acute onset shortness of breath after being on dialysis for about an hour, combative on route and in the ED, confused, hypoxic and intubated .  Sedated with propofol and fentanyl drip, chest x-ray consistent with acute pulm edema.  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 rate 14, height '5\' 2"'$  (1.575 m), weight 63.9 kg, SpO2 100 %.    Vent Mode: PRVC FiO2 (%):  [60 %] 60 % Set Rate:  [24 bmp] 24 bmp Vt Set:  [400 mL] 400 mL PEEP:  [5 cmH20] 5 cmH20 Plateau Pressure:  [27 cmH20] 27 cmH20  No intake or output data in the 24 hours ending 08/15/20 1722 Filed Weights   08/15/20 1526  Weight: 63.9 kg    Examination: General: Chronically ill-appearing, intubated and sedated HENT: Mild pallor, no icterus, JVD +, orally intubated Lungs: Crackles on the right, no rhonchi, no accessory muscle use Cardiovascular: S1-S2 regular, no murmur , right IJ dialysis catheter Abdomen: Soft, nontender Extremities: 1+ edema, good pulses Neuro: Sedated, RASS -3   Resolved Hospital  Problem list     Assessment & Plan:  Intubated for what appears to be acute pulm edema EKG shows LVH with poor R wave progression, no evidence of ischemia, troponin low.  No reason to suspect pericardial effusion on exam or chest x-ray, previous echo 03/2020 showed LVH with mild diastolic dysfunction. COVID test is pending  Acute hypoxic/hypercarbic respiratory failure -Vent settings reviewed and adjusted, FiO2 has been lowered to 60% -We will use propofol/fentanyl for sedation with goal RASS -1  Acute pulmonary edema -hypertensive heart disease -cycle troponins -We will defer echo for now -Resume metoprolol and nifedipine for hypertension  ESRD -we will consult renal  -Her HD session had to be terminated and she will need additional HD today -Replete hypokalemia cautiously  Known seizure disorder -resume Keppra  Best Practice (right click and "Reselect all SmartList Selections" daily)   Diet/type: NPO w/ meds via tube DVT prophylaxis: prophylactic heparin  GI prophylaxis: PPI Lines: N/A Foley:  N/A Code Status:  full code Last date of multidisciplinary goals of care discussion [NA]  Labs   CBC: Recent Labs  Lab 08/15/20 1510 08/15/20 1520 08/15/20 1615  WBC  --  5.9  --   HGB 12.6 10.4* 10.2*  HCT 37.0 34.5* 30.0*  MCV  --  96.1  --   PLT  --  113*  --     Basic Metabolic Panel: Recent Labs  Lab 08/15/20 1510 08/15/20 1520 08/15/20 1615  NA 142 138 142  K 3.7 2.4* 2.6*  CL  --  103  --   CO2  --  21*  --   GLUCOSE  --  195*  --   BUN  --  13  --   CREATININE  --  4.67*  --   CALCIUM  --  7.9*  --    GFR: Estimated Creatinine Clearance: 10.8 mL/min (A) (by C-G formula based on SCr of 4.67 mg/dL (H)). Recent Labs  Lab 08/15/20 1520  WBC 5.9    Liver Function Tests: No results for input(s): AST, ALT, ALKPHOS, BILITOT, PROT, ALBUMIN in the last 168 hours. No results for input(s): LIPASE, AMYLASE in the last 168 hours. No results for input(s):  AMMONIA in the last 168 hours.  ABG    Component Value Date/Time   PHART 7.452 (H) 08/15/2020 1615   PCO2ART 38.6 08/15/2020 1615   PO2ART 86 08/15/2020 1615   HCO3 27.0 08/15/2020 1615   TCO2 28 08/15/2020 1615   ACIDBASEDEF 4.0 (H) 08/15/2020 1510   O2SAT 97.0 08/15/2020 1615     Coagulation Profile: No results for input(s): INR, PROTIME in the last 168 hours.  Cardiac Enzymes: No results for input(s): CKTOTAL, CKMB, CKMBINDEX, TROPONINI in the last 168 hours.  HbA1C: No results found for: HGBA1C  CBG: No results for input(s): GLUCAP in the last 168 hours.  Review of Systems:   Unable to obtain since intubated and sedated  Past Medical History:  She,  has a past medical history of Hypertension.   Surgical History:   Past Surgical History:  Procedure Laterality Date   AV FISTULA PLACEMENT Left 03/13/2020   Procedure: Creation of Left Brachiocephalic fistula;  Surgeon: Marty Heck, MD;  Location: [place];  Service: Vascular;  Laterality: Left;   IR FLUORO GUIDE CV LINE RIGHT  02/26/2020   IR FLUORO GUIDE CV LINE RIGHT  03/09/2020   IR US GUIDE VASC ACCESS RIGHT  02/26/2020   IR US GUIDE VASC ACCESS RIGHT  03/09/2020     Social History:   reports that she has never smoked. She has never used smokeless tobacco. She reports current alcohol use. She reports that she does not use drugs.   Family History:  Her family history is not on file.   Allergies Allergies  Allergen Reactions   Shellfish-Derived Products Swelling and Other (See Comments)    Sweat and Dizzy   Erythromycin Other (See Comments)    Pt does not recall   Iodine Other (See Comments)    Arm flares up per pt   Penicillins     Has patient had a PCN reaction causing immediate rash, facial/tongue/throat swelling, SOB or lightheadedness with hypotension: no Has patient had a PCN reaction causing severe rash involving mucus membranes or skin necrosis: yes Has patient had a PCN reaction that required  hospitalization: no Has patient had a PCN reaction occurring within the last 10 years: no If all of the above answers are "NO", then may proceed with Cephalosporin use.   Chlorhexidine Rash     Home Medications  Prior to Admission medications   Medication Sig Start Date End Date Taking? Authorizing Provider  acetaminophen (TYLENOL) 500 MG tablet Take 500 mg by mouth every 6 (six) hours as needed for mild pain or moderate pain.   Yes [provider]  aspirin EC 81 MG tablet Take 81 mg by mouth daily. Chewable   Yes [provider]  Metoprolol Tartrate 75 MG TABS Take 75 mg by mouth 2 (two) times daily. 04/04/20 08/15/20 Yes Alma Friendly, MD  NIFEdipine (ADALAT CC) 30 MG  24 hr tablet Take 1 tablet (30 mg total) by mouth daily. 04/04/20 08/15/20 Yes Alma Friendly, MD  levETIRAcetam (KEPPRA) 250 MG tablet Take 1 tablet (250 mg total) by mouth Every Tuesday,Thursday,and Saturday with dialysis. Patient not taking: Reported on 04/27/2020 04/04/20   Alma Friendly, MD  levETIRAcetam (KEPPRA) 500 MG tablet Take 1 tablet (500 mg total) by mouth daily. Patient not taking: Reported on 04/27/2020 04/04/20   Alma Friendly, MD     Critical care time: [place] MD. [facility]. [place] [facility] Pager : [phone]  If no response to pager , please call [phone] until 7 pm After 7:00 pm call Elink  [phone]   08/15/2020

## 2020-08-15 NOTE — ED Triage Notes (Signed)
At dialysis, called for SOB, 1.5 hours left, acute onset SOB, NRB mask, desatted to 91% with [place], 99% on NRB, rales lower bilaterally, then desat 85%, combative.

## 2020-08-15 NOTE — ED Provider Notes (Signed)
[facility] EMERGENCY DEPARTMENT Provider Note   CSN: [id number] Arrival date & time: 08/15/20  1458     History Chief Complaint  Patient presents with   Shortness of Breath    Barbara Crawford is a 65 y.o. female.   Shortness of Breath  Patient presents to the ER for cute onset of shortness of breath.  Patient has chronic renal failure and she was at dialysis.  She had about 1-1/2 hours left when she acutely became more short of breath.  Staff attempted placing her on supplemental oxygen.  She was placed on nasal cannula then required a nonrebreather.  Patient's saturations dropped down to 85%.  She became more combative on route for EMS.  She was trying to pull off her oxygen mask.  She would not tolerate CPAP.  In the ED the patient remained combative was pulling at her oxygen mask and was rolling over in bed almost pulling out her IV.  Patient was calling out for help.  She was confused and not following commands  Past Medical History:  Diagnosis Date   Hypertension     Patient Active Problem List   Diagnosis Date Noted   Encounter for orogastric (OG) tube placement    FSGS (focal segmental glomerulosclerosis)    Pleural effusion 03/07/2020   ABLA (acute blood loss anemia) 03/06/2020   Sepsis ([place]) versus SIRS due to autoimmune process 03/05/2020   Fever    Glomerulonephritis    Perinephric hematoma 03/04/2020   Seizure ([place]) 03/04/2020   Lobar pneumonia ([place]) 03/04/2020   Acute renal failure ([place]) 02/25/2020   Normocytic anemia 02/25/2020   Uremia 02/25/2020   HTN (hypertension) 02/25/2020   Acute bronchitis 08/16/2017   Tachycardia 08/16/2017   Stiffness of left shoulder joint 01/09/2017   Cough 02/08/2015    Past Surgical History:  Procedure Laterality Date   AV FISTULA PLACEMENT Left 03/13/2020   Procedure: Creation of Left Brachiocephalic fistula;  Surgeon: Marty Heck, MD;  Location: [facility];  Service: Vascular;  Laterality:  Left;   IR FLUORO GUIDE CV LINE RIGHT  02/26/2020   IR FLUORO GUIDE CV LINE RIGHT  03/09/2020   IR US GUIDE VASC ACCESS RIGHT  02/26/2020   IR US GUIDE VASC ACCESS RIGHT  03/09/2020     OB History   No obstetric history on file.     No family history on file.  Social History   Tobacco Use   Smoking status: Never   Smokeless tobacco: Never  Vaping Use   Vaping Use: Never used  Substance Use Topics   Alcohol use: Yes   Drug use: No    Home Medications Prior to Admission medications   Medication Sig Start Date End Date Taking? Authorizing Provider  acetaminophen (TYLENOL) 500 MG tablet Take 500 mg by mouth every 6 (six) hours as needed for mild pain or moderate pain.    [provider]  aspirin EC 81 MG tablet Take 81 mg by mouth daily. Chewable    [provider]  levETIRAcetam (KEPPRA) 250 MG tablet Take 1 tablet (250 mg total) by mouth Every Tuesday,Thursday,and Saturday with dialysis. Patient not taking: Reported on 04/27/2020 04/04/20   Alma Friendly, MD  levETIRAcetam (KEPPRA) 500 MG tablet Take 1 tablet (500 mg total) by mouth daily. Patient not taking: Reported on 04/27/2020 04/04/20   Alma Friendly, MD  Metoprolol Tartrate 75 MG TABS Take 75 mg by mouth 2 (two) times daily. 04/04/20 05/04/20  Alma Friendly, MD  NIFEdipine (ADALAT CC) 30 MG 24 hr tablet Take 1 tablet (30 mg total) by mouth daily. 04/04/20 05/04/20  Alma Friendly, MD    Allergies    Shellfish-derived products, Erythromycin, Iodine, Penicillins, and Chlorhexidine  Review of Systems   Review of Systems  Unable to perform ROS: Acuity of condition  Respiratory:  Positive for shortness of breath.    Physical Exam Updated Vital Signs BP (!) 172/74 (BP Location: Right Arm)   Pulse 70   Temp 98.2 F (36.8 C) (Oral)   Resp 14   Ht 1.575 m ('5\' 2"'$ )   Wt 63.9 kg   SpO2 100%   BMI 25.77 kg/m   Physical Exam Vitals and nursing note reviewed.  Constitutional:      General:  She is in acute distress.     Appearance: She is ill-appearing.  HENT:     Head: Normocephalic and atraumatic.     Right Ear: External ear normal.     Left Ear: External ear normal.  Eyes:     General: No scleral icterus.       Right eye: No discharge.        Left eye: No discharge.     Conjunctiva/sclera: Conjunctivae normal.  Neck:     Trachea: No tracheal deviation.  Cardiovascular:     Rate and Rhythm: Normal rate and regular rhythm.  Pulmonary:     Effort: Tachypnea, accessory muscle usage and respiratory distress present.     Breath sounds: No stridor. Wheezing and rales present.  Chest:     Comments: Vascular access line right subclavian region, no surrounding erythema Abdominal:     General: Bowel sounds are normal. There is no distension.     Palpations: Abdomen is soft.     Tenderness: There is no abdominal tenderness. There is no guarding or rebound.  Musculoskeletal:        General: No tenderness or deformity.     Cervical back: Neck supple.     Right lower leg: No edema.     Left lower leg: No edema.  Skin:    General: Skin is warm and dry.     Findings: No rash.  Neurological:     General: No focal deficit present.     Cranial Nerves: No cranial nerve deficit (no facial droop, extraocular movements intact, no slurred speech).     Sensory: No sensory deficit.     Motor: No abnormal muscle tone or seizure activity.     Coordination: Coordination normal.  Psychiatric:        Mood and Affect: Mood normal.    ED Results / Procedures / Treatments   Labs (all labs ordered are listed, but only abnormal results are displayed) Labs Reviewed  BASIC METABOLIC PANEL - Abnormal; Notable for the following components:      Result Value   Potassium 2.4 (*)    CO2 21 (*)    Glucose, Bld 195 (*)    Creatinine, Ser 4.67 (*)    Calcium 7.9 (*)    GFR, Estimated 10 (*)    All other components within normal limits  CBC - Abnormal; Notable for the following components:    RBC 3.59 (*)    Hemoglobin 10.4 (*)    HCT 34.5 (*)    Platelets 113 (*)    All other components within normal limits  I-STAT ARTERIAL BLOOD GAS, ED - Abnormal; Notable for the following components:   pH, Arterial  7.100 (*)    pCO2 arterial 87.7 (*)    pO2, Arterial 32 (*)    Acid-base deficit 4.0 (*)    All other components within normal limits  I-STAT ARTERIAL BLOOD GAS, ED - Abnormal; Notable for the following components:   pH, Arterial 7.452 (*)    Acid-Base Excess 3.0 (*)    Potassium 2.6 (*)    Calcium, Ion 1.05 (*)    HCT 30.0 (*)    Hemoglobin 10.2 (*)    All other components within normal limits  TROPONIN I (HIGH SENSITIVITY) - Abnormal; Notable for the following components:   Troponin I (High Sensitivity) 33 (*)    All other components within normal limits  RESP PANEL BY RT-PCR (FLU A&B, COVID) ARPGX2  BLOOD GAS, ARTERIAL  CBG MONITORING, ED  TROPONIN I (HIGH SENSITIVITY)    EKG None  Radiology DG Chest Portable 1 View  Result Date: 08/15/2020 CLINICAL DATA:  Post intubation EXAM: PORTABLE CHEST 1 VIEW COMPARISON:  March 13, 2020 FINDINGS: A double lumen catheter consistent with a dialysis catheter is stable terminating in the central SVC. No pneumothorax. Diffuse bilateral pulmonary opacities. Mild cardiomegaly. The ETT is in good position. No other acute abnormalities. IMPRESSION: Findings are most consistent with cardiomegaly and developing pulmonary edema. A diffuse infectious process could have a similar appearance in the appropriate clinical setting. Electronically Signed   By: Dorise Bullion III M.D.   On: 08/15/2020 15:38    Procedures .Critical Care  Date/Time: 08/15/2020 3:25 PM Performed by: Dorie Rank, MD Authorized by: Dorie Rank, MD   Critical care provider statement:    Critical care time (minutes):  45   Critical care was time spent personally by me on the following activities:  Discussions with consultants, evaluation of patient's response to  treatment, examination of patient, ordering and performing treatments and interventions, ordering and review of laboratory studies, ordering and review of radiographic studies, pulse oximetry, re-evaluation of patient's condition, obtaining history from patient or surrogate and review of old charts Procedure Name: Intubation Date/Time: 08/15/2020 3:25 PM Performed by: Dorie Rank, MD Pre-anesthesia Checklist: Patient identified, Emergency Drugs available, Suction available, Timeout performed and Patient being monitored Oxygen Delivery Method: Non-rebreather mask Preoxygenation: Pre-oxygenation with 100% oxygen Induction Type: Rapid sequence Ventilation: Mask ventilation with difficulty Laryngoscope Size: Glidescope Grade View: Grade I Tube size: 7.5 mm Number of attempts: 1 Airway Equipment and Method: Video-laryngoscopy Dental Injury: Teeth and Oropharynx as per pre-operative assessment  Comments: Intubated 1st attempt without difficulty      Medications Ordered in ED Medications  fentaNYL (SUBLIMAZE) injection 50 mcg (has no administration in time range)  fentaNYL 2561mg in NS 2561m(1069mml) infusion-PREMIX (50 mcg/hr Intravenous New Bag/Given 08/15/20 1541)  fentaNYL (SUBLIMAZE) bolus via infusion 50 mcg (has no administration in time range)  propofol (DIPRIVAN) 1000 MG/100ML infusion (10 mcg/kg/min  63.9 kg Intravenous New Bag/Given 08/15/20 1538)  nitroGLYCERIN 50 mg in dextrose 5 % 250 mL (0.2 mg/mL) infusion (has no administration in time range)  0.9 %  sodium chloride infusion (1,000 mLs Intravenous New Bag/Given 08/15/20 1512)  etomidate (AMIDATE) injection (20 mg Intravenous Given 08/15/20 1513)  rocuronium (ZEMURON) injection (70 mg Intravenous Given 08/15/20 1514)  fentaNYL (SUBLIMAZE) injection (50 mcg Intravenous Given 08/15/20 1520)  potassium chloride 10 mEq in 100 mL IVPB (has no administration in time range)    ED Course  I have reviewed the triage vital signs and the  nursing notes.  Pertinent labs & imaging results that  were available during my care of the patient were reviewed by me and considered in my medical decision making (see chart for details).  Clinical Course as of 08/15/20 1658  Sat Aug 15, 2020  1648 Chest x-ray most suggestive of cardiomegaly and developing pulmonary edema [JK]  1649 CBC without leukocytosis.  Stable anemia. [JK]  [id number] Metabolic panel shows hypokalemia. [JK]  1653 Initial troponin elevated at 33. [JK]  [id number] Patient's initial VBG showed a pH of 7.1 and a PCO2 of 87.  That is not showing up in her results at this time.  The arterial blood gas does show an improvement with her pH now at 7.4 PCO2 38 after being on the ventilator. [JK]    Clinical Course User Index [JK] Dorie Rank, MD   MDM Rules/Calculators/A&P                           Patient presented with acute shortness of breath while on dialysis.  Patient presented to the ED altered and combative.  She was complaining of severe respiratory distress.  Patient was trying to pull her oxygen mask off.  Patient was intubated for her respiratory distress and in order to proceed with her evaluation.  Patient's initial venous blood gas did show that she was hypercarbic and had a respiratory acidosis with a pH of 7.1, PCO2 was 87.  Patient's chest x-ray does suggest pulmonary edema labs are notable for hypokalemia.  We will give her a dose of potassium IV.  Patient's ABG does show significant improvement.  I will consult with nephrology regarding the need for dialysis.  I will consult with pulmonary critical care for admission Final Clinical Impression(s) / ED Diagnoses Final diagnoses:  Flash pulmonary edema ([place])  Hypokalemia  Acute hypercapnic respiratory failure ([facility])     Dorie Rank, MD 08/16/20 1335

## 2020-08-16 ENCOUNTER — Inpatient Hospital Stay (HOSPITAL_COMMUNITY): Payer: Medicare Other

## 2020-08-16 DIAGNOSIS — J810 Acute pulmonary edema: Secondary | ICD-10-CM | POA: Diagnosis not present

## 2020-08-16 DIAGNOSIS — N186 End stage renal disease: Secondary | ICD-10-CM | POA: Diagnosis not present

## 2020-08-16 DIAGNOSIS — J9601 Acute respiratory failure with hypoxia: Secondary | ICD-10-CM | POA: Diagnosis not present

## 2020-08-16 DIAGNOSIS — J9602 Acute respiratory failure with hypercapnia: Secondary | ICD-10-CM | POA: Diagnosis not present

## 2020-08-16 LAB — GLUCOSE, CAPILLARY
Glucose-Capillary: 102 mg/dL — ABNORMAL HIGH (ref 70–99)
Glucose-Capillary: 113 mg/dL — ABNORMAL HIGH (ref 70–99)
Glucose-Capillary: 151 mg/dL — ABNORMAL HIGH (ref 70–99)
Glucose-Capillary: 50 mg/dL — ABNORMAL LOW (ref 70–99)
Glucose-Capillary: 52 mg/dL — ABNORMAL LOW (ref 70–99)
Glucose-Capillary: 58 mg/dL — ABNORMAL LOW (ref 70–99)
Glucose-Capillary: 89 mg/dL (ref 70–99)
Glucose-Capillary: 94 mg/dL (ref 70–99)
Glucose-Capillary: 97 mg/dL (ref 70–99)

## 2020-08-16 LAB — CBC
HCT: 26.3 % — ABNORMAL LOW (ref 36.0–46.0)
Hemoglobin: 8.5 g/dL — ABNORMAL LOW (ref 12.0–15.0)
MCH: 29.1 pg (ref 26.0–34.0)
MCHC: 32.3 g/dL (ref 30.0–36.0)
MCV: 90.1 fL (ref 80.0–100.0)
Platelets: 105 10*3/uL — ABNORMAL LOW (ref 150–400)
RBC: 2.92 MIL/uL — ABNORMAL LOW (ref 3.87–5.11)
RDW: 14.2 % (ref 11.5–15.5)
WBC: 3.3 10*3/uL — ABNORMAL LOW (ref 4.0–10.5)
nRBC: 0 % (ref 0.0–0.2)

## 2020-08-16 LAB — POCT I-STAT 7, (LYTES, BLD GAS, ICA,H+H)
Acid-Base Excess: 6 mmol/L — ABNORMAL HIGH (ref 0.0–2.0)
Bicarbonate: 27.4 mmol/L (ref 20.0–28.0)
Calcium, Ion: 1 mmol/L — ABNORMAL LOW (ref 1.15–1.40)
HCT: 27 % — ABNORMAL LOW (ref 36.0–46.0)
Hemoglobin: 9.2 g/dL — ABNORMAL LOW (ref 12.0–15.0)
O2 Saturation: 100 %
Patient temperature: 97.6
Potassium: 4 mmol/L (ref 3.5–5.1)
Sodium: 139 mmol/L (ref 135–145)
TCO2: 28 mmol/L (ref 22–32)
pCO2 arterial: 27.2 mmHg — ABNORMAL LOW (ref 32.0–48.0)
pH, Arterial: 7.609 (ref 7.350–7.450)
pO2, Arterial: 151 mmHg — ABNORMAL HIGH (ref 83.0–108.0)

## 2020-08-16 LAB — BASIC METABOLIC PANEL
Anion gap: 7 (ref 5–15)
BUN: 18 mg/dL (ref 8–23)
CO2: 25 mmol/L (ref 22–32)
Calcium: 7.6 mg/dL — ABNORMAL LOW (ref 8.9–10.3)
Chloride: 106 mmol/L (ref 98–111)
Creatinine, Ser: 5.41 mg/dL — ABNORMAL HIGH (ref 0.44–1.00)
GFR, Estimated: 8 mL/min — ABNORMAL LOW (ref >60)
Glucose, Bld: 84 mg/dL (ref 70–99)
Potassium: 3.9 mmol/L (ref 3.5–5.1)
Sodium: 138 mmol/L (ref 135–145)

## 2020-08-16 LAB — TROPONIN I (HIGH SENSITIVITY): Troponin I (High Sensitivity): 73 ng/L — ABNORMAL HIGH (ref <18)

## 2020-08-16 LAB — TRIGLYCERIDES: Triglycerides: 68 mg/dL (ref <150)

## 2020-08-16 IMAGING — DX DG CHEST 1V PORT
1 series · 1 of 1 positions shown · non-contrast
Comparison: [DATE]

CLINICAL DATA: Patient's respiratory dependent. Evaluate ETT.
Pneumothorax.

EXAM:
PORTABLE CHEST 1 VIEW

[chest]
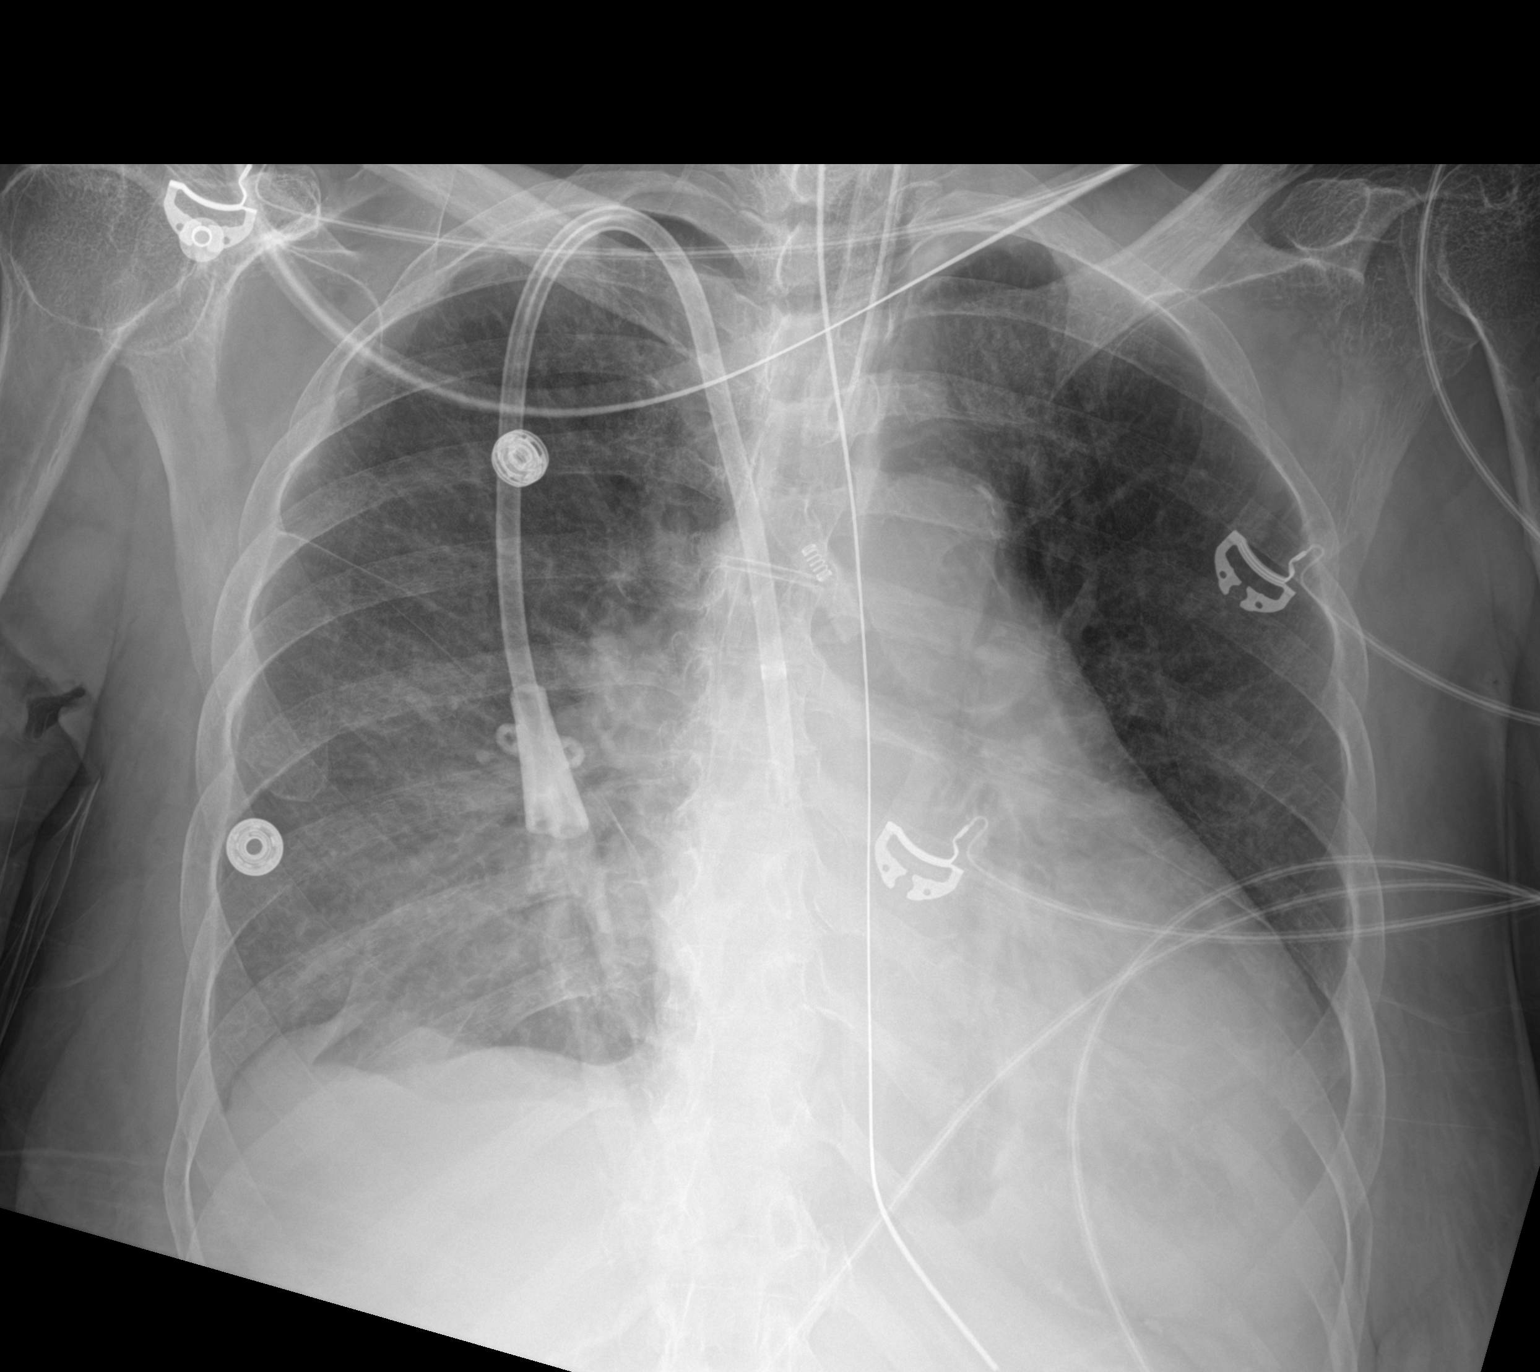

[1 of 1 positions shown; findings below may reference images not displayed]

FINDINGS: The ETT is in good position. The NG tube terminates below today's
film. The right dialysis catheter is stable. Opacity in left brace
obscures the left hemidiaphragm, increased in the interval, likely
atelectasis. The diffuse opacities in the lungs have resolved on the
left and mildly improved on the right. Probable small layering right
effusion.
IMPRESSION: 1. The left-sided opacity on the previous study has resolved. The
right-sided diffuse opacity has improved but persists. Probable
layering effusion on the right. Findings are suspected to represent
improving now asymmetric edema. An infectious process is not
completely excluded.
2. Support apparatus as above.
3. No other abnormalities.

## 2020-08-16 MED ORDER — HEPARIN SODIUM (PORCINE) 1000 UNIT/ML DIALYSIS: 2000.0000 [IU] | INTRAMUSCULAR | Status: DC | PRN

## 2020-08-16 MED ORDER — DEXTROSE 50 % IV SOLN
INTRAVENOUS | Status: AC
  Administered 2020-08-16: 50 mL
  Filled 2020-08-16: qty 50

## 2020-08-16 MED ORDER — DEXTROSE-NACL 5-0.9 % IV SOLN: INTRAVENOUS | Status: DC

## 2020-08-16 MED ORDER — HEPARIN SODIUM (PORCINE) 5000 UNIT/ML IJ SOLN
5000.0000 [IU] | Freq: Three times a day (TID) | INTRAMUSCULAR | Status: DC
  Administered 2020-08-16 – 2020-08-18 (×6): 5000 [IU] via SUBCUTANEOUS
  Filled 2020-08-16 (×7): qty 1

## 2020-08-16 MED ORDER — MAGNESIUM SULFATE 2 GM/50ML IV SOLN
2.0000 g | Freq: Once | INTRAVENOUS | Status: AC
  Administered 2020-08-16: 2 g via INTRAVENOUS
  Filled 2020-08-16: qty 50

## 2020-08-16 MED ORDER — DEXTROSE 50 % IV SOLN: 25.0000 g | INTRAVENOUS | Status: AC

## 2020-08-16 MED ORDER — DEXTROSE 50 % IV SOLN
INTRAVENOUS | Status: AC
  Administered 2020-08-16: 25 g via INTRAVENOUS
  Filled 2020-08-16: qty 50

## 2020-08-16 MED ORDER — NOREPINEPHRINE 4 MG/250ML-% IV SOLN
0.0000 ug/min | INTRAVENOUS | Status: DC
  Administered 2020-08-16: 2 ug/min via INTRAVENOUS
  Filled 2020-08-16: qty 250

## 2020-08-16 MED ORDER — NOREPINEPHRINE 4 MG/250ML-% IV SOLN: 2.0000 ug/min | INTRAVENOUS | Status: DC

## 2020-08-16 MED ORDER — METOPROLOL TARTRATE 25 MG PO TABS
25.0000 mg | ORAL_TABLET | Freq: Two times a day (BID) | ORAL | Status: DC
  Administered 2020-08-16 – 2020-08-18 (×3): 25 mg via ORAL
  Filled 2020-08-16 (×3): qty 1

## 2020-08-16 MED ORDER — DEXTROSE 10 % IV SOLN: INTRAVENOUS | Status: DC

## 2020-08-16 MED ORDER — ORAL CARE MOUTH RINSE
15.0000 mL | OROMUCOSAL | Status: DC
  Administered 2020-08-16 – 2020-08-17 (×9): 15 mL via OROMUCOSAL

## 2020-08-16 MED ORDER — SODIUM CHLORIDE 0.9 % IV SOLN: 250.0000 mL | INTRAVENOUS | Status: DC

## 2020-08-16 NOTE — Progress Notes (Signed)
[facility] Progress Note  Subjective: BP's on the lower side on HD, UF 1.7 L overnight. Extubated this am.   Vitals:   08/16/20 0615 08/16/20 0630 08/16/20 0739 08/16/20 0811  BP: 139/80 137/78    Pulse:   81   Resp: '18 18 18   '$ Temp:    99.9 F (37.7 C)  TempSrc:    Axillary  SpO2:   100%   Weight: 60.2 kg     Height:        Exam:  alert, nad   no jvd  Chest rales 1/3 up on L, 1/4 up on R  Cor reg no RG  Abd soft ntnd no ascites   Ext no LE edema   Alert, NF, ox3   RIJ TDC/ maturing LUA AVF+bruit          Home meds - nifedipine, keppra, asa, metoprolol bid     OP HD: pending     Assessment/ Plan: Acute resp failure/ pulm edema - suspect vol overload +/- HTN crisis as cause. Intubated in ED. HD overnight w/ 1.7 L UF due to soft BP's.  F/u CXR shows improved edema. Will plan extra HD tomorrow off schedule for further vol reduction.  ESRD - on HD TTS, get records. Had HD overnight Sat night. HD again tomorrow off schedule as above.  HTN - BP's sig high on presentation but have been normal today since off the vent/ sedation.  H/o COVID infection 12/2019 H/o seizures - on [place] 08/16/2020, 8:30 AM   Recent Labs  Lab 08/15/20 2023 08/16/20 0256 08/16/20 0328  K 3.1* 3.9 4.0  BUN 15 18  --   CREATININE 5.04* 5.41*  --   CALCIUM 7.8* 7.6*  --   PHOS 2.9  --   --   HGB  --  8.5* 9.2*   Inpatient medications:  dextrose       fentaNYL (SUBLIMAZE) injection  50 mcg Intravenous Once   heparin injection (subcutaneous)  5,000 Units Subcutaneous Q8H   insulin aspart  0-9 Units Subcutaneous Q4H   levETIRAcetam  500 mg Per Tube Daily   mouth rinse  15 mL Mouth Rinse 10 times per day   pantoprazole (PROTONIX) IV  40 mg Intravenous QHS    sodium chloride     dextrose     magnesium sulfate bolus IVPB 2 g (08/16/20 0747)   norepinephrine (LEVOPHED) Adult infusion 10 mcg/min (08/16/20 0600)   propofol (DIPRIVAN) infusion 30  mcg/kg/min (08/16/20 0600)   acetaminophen, docusate, heparin, polyethylene glycol

## 2020-08-16 NOTE — Progress Notes (Signed)
NAME:  Barbara Crawford, MRN:  [id number], DOB:  1955-03-22, LOS: 1 ADMISSION DATE:  08/15/2020, CONSULTATION DATE:  08/16/2020  REFERRING MD:  Tomi Bamberger, EDP, CHIEF COMPLAINT: Respiratory distress, intubated  History of Present Illness:  65 year old woman with ESRD on HD brought in by EMS for acute onset shortness of breath after being on dialysis for about an hour, combative on route and in the ED, confused, hypoxic and intubated .  Sedated with propofol and fentanyl drip, chest x-ray consistent with acute pulm edema.  VBG was 7.1/87 Labs significant for hypokalemia  Pertinent  Medical History  -Obtained from chart review COVID infection December 2021 ESRD on HD since 02/2020, FSGS on biopsy PEA arrest after temporary HD catheter placement with seizures, placed on Keppra -Failed forearm AV fistula  -Hypertension  Significant Hospital Events: Including procedures, antibiotic start and stop dates in addition to other pertinent events   CT chest without contrast 03/2020 decreasing effusions, left lower lobe atelectasis  Interim History / Subjective:   Developed hypotension during HD yesterday, Levophed added Remains deeply sedated on propofol and fentanyl drips Hypoglycemic events, D5 NS added  Objective   Blood pressure 137/78, pulse 81, temperature 99.9 F (37.7 C), temperature source Axillary, resp. rate 18, height '5\' 2"'$  (1.575 m), weight 60.2 kg, SpO2 100 %.    Vent Mode: PRVC FiO2 (%):  [40 %-60 %] 40 % Set Rate:  [18 bmp-24 bmp] 18 bmp Vt Set:  [400 mL] 400 mL PEEP:  [5 cmH20] 5 cmH20 Plateau Pressure:  [22 cmH20-29 cmH20] 22 cmH20   Intake/Output Summary (Last 24 hours) at 08/16/2020 0816 Last data filed at 08/16/2020 0615 Gross per 24 hour  Intake 660.75 ml  Output 1700 ml  Net -1039.25 ml   Filed Weights   08/16/20 0230 08/16/20 0412 08/16/20 0615  Weight: 58.7 kg 61.9 kg 60.2 kg    Examination: General: Chronically ill-appearing, intubated and sedated HENT:  Mild pallor, no icterus, JVD +, orally intubated Lungs: Clear breath sounds bilateral, no rhonchi, no accessory muscle use Cardiovascular: S1-S2 regular, no murmur , right IJ dialysis catheter Abdomen: Soft, nontender Extremities: 1+ edema, good pulses Neuro: Sedated on propofol and fentanyl, RASS -3   Labs show very mildly elevated troponins, stable anemia, no leukocytosis normal electrolytes. ABG shows severe respiratory and metabolic acidosis Chest x-ray 8/13 independently reviewed shows pulmonary edema pattern with cardiomegaly  Resolved Hospital Problem list     Assessment & Plan:  Intubated for what appears to be acute pulm edema EKG shows LVH with poor R wave progression, no evidence of ischemia, troponin low.  No reason to suspect pericardial effusion on exam or chest x-ray, previous echo 03/2020 showed LVH with mild diastolic dysfunction.   Acute hypoxic/hypercarbic respiratory failure -Minimal oxygen requirements, spontaneous breathing trials when awake with goal extubation -Turn off sedation for WUA, goal  RASS -1  Acute pulmonary edema -hypertensive heart disease Echo 03/2020 LVH -Repeat troponin x1 to trend -Repeat echo  History of hypertension, now hypotensive likely related to sedative drips -Hold metoprolol and nifedipine for hypertension -Titrate Levophed to off  ESRD -HD per renal Severe alkalosis respiratory metabolic  -lower RR to 16 , expect to equilibrate  Known seizure disorder -resume Keppra  Hypoglycemia while n.p.o. -D10 drip, if not extubated will start tube feeds  Summary -while this could simply be fluid overload in the setting of dialysis patient, have to be vigilant for occult cardiac ischemia and small repeat echo and trend troponin further  Best Practice (right click and "Reselect all SmartList Selections" daily)   Diet/type: NPO w/ meds via tube DVT prophylaxis: prophylactic heparin  GI prophylaxis: PPI Lines: N/A Foley:  N/A Code  Status:  full code Last date of multidisciplinary goals of care discussion [NA]  Labs   CBC: Recent Labs  Lab 08/15/20 1510 08/15/20 1520 08/15/20 1615 08/16/20 0256 08/16/20 0328  WBC  --  5.9  --  3.3*  --   HGB 12.6 10.4* 10.2* 8.5* 9.2*  HCT 37.0 34.5* 30.0* 26.3* 27.0*  MCV  --  96.1  --  90.1  --   PLT  --  113*  --  105*  --      Basic Metabolic Panel: Recent Labs  Lab 08/15/20 1520 08/15/20 1615 08/15/20 2023 08/16/20 0256 08/16/20 0328  NA 138 142 139 138 139  K 2.4* 2.6* 3.1* 3.9 4.0  CL 103  --  103 106  --   CO2 21*  --  24 25  --   GLUCOSE 195*  --  73 84  --   BUN 13  --  15 18  --   CREATININE 4.67*  --  5.04* 5.41*  --   CALCIUM 7.9*  --  7.8* 7.6*  --   MG  --   --  1.6*  --   --   PHOS  --   --  2.9  --   --     GFR: Estimated Creatinine Clearance: 9.1 mL/min (A) (by C-G formula based on SCr of 5.41 mg/dL (H)). Recent Labs  Lab 08/15/20 1520 08/16/20 0256  WBC 5.9 3.3*     Liver Function Tests: No results for input(s): AST, ALT, ALKPHOS, BILITOT, PROT, ALBUMIN in the last 168 hours. No results for input(s): LIPASE, AMYLASE in the last 168 hours. No results for input(s): AMMONIA in the last 168 hours.  ABG    Component Value Date/Time   PHART 7.609 (HH) 08/16/2020 0328   PCO2ART 27.2 (L) 08/16/2020 0328   PO2ART 151 (H) 08/16/2020 0328   HCO3 27.4 08/16/2020 0328   TCO2 28 08/16/2020 0328   ACIDBASEDEF 4.0 (H) 08/15/2020 1510   O2SAT 100.0 08/16/2020 0328      Coagulation Profile: No results for input(s): INR, PROTIME in the last 168 hours.  Cardiac Enzymes: No results for input(s): CKTOTAL, CKMB, CKMBINDEX, TROPONINI in the last 168 hours.  HbA1C: Hgb A1c MFr Bld  Date/Time Value Ref Range Status  08/15/2020 08:23 PM 5.4 4.8 - 5.6 % Final    Comment:    (NOTE) Pre diabetes:          5.7%-6.4%  Diabetes:              >6.4%  Glycemic control for   <7.0% adults with diabetes     CBG: Recent Labs  Lab  08/15/20 2341 08/16/20 0005 08/16/20 0315 08/16/20 0400 08/16/20 0750  GLUCAP 66* 113* 52* 102* 58*    Critical care time: [place] MD. FCCP. [facility] Pager : [phone]  If no response to pager , please call [phone] until 7 pm After 7:00 pm call Elink  [phone]   08/16/2020

## 2020-08-16 NOTE — Progress Notes (Signed)
Hypoglycemic Event  CBG: 52  Treatment: D50 50 mL (25 gm)  Symptoms: None  Follow-up CBG: Time:0401 CBG Result:102  Possible Reasons for Event: Inadequate meal intake  Comments/MD notified:E-link    Lorraine Lax

## 2020-08-16 NOTE — Progress Notes (Signed)
Profound respiratory alkalemia. RR decrease from 24 to RR 18. MD made aware.

## 2020-08-16 NOTE — Progress Notes (Signed)
[facility] Progress Note Patient Name: Barbara Crawford DOB: 24-Feb-1955 MRN: [id number]   Date of Service  08/16/2020  HPI/Events of Note  Significant resp and metabolic alkalemia on abg. 2 hypoglycemic events  eICU Interventions  RR on vent decreased from 24 to 18.  D5NS at 50 cc/hr started     Intervention Category Major Interventions: Acid-Base disturbance - evaluation and management  Mauri [place], P 08/16/2020, 3:59 AM

## 2020-08-16 NOTE — Procedures (Signed)
Extubation Procedure Note  Patient Details:   Name: Barbara Crawford DOB: 1955-02-12 MRN: [id number]   Airway Documentation:    Vent end date: 08/16/20 Vent end time: 0958   Evaluation  O2 sats: stable throughout Complications: No apparent complications Patient did tolerate procedure well. Bilateral Breath Sounds: Diminished, Rhonchi   Yes  Pt extubated to 4L [place], cuff leak present, no stridor noted, RN at bedside, MD aware, RT will continue to monitor.    Carren Rang 08/16/2020, 9:59 AM

## 2020-08-16 NOTE — Progress Notes (Signed)
Patient receiving iHD per nephrology orders in ED - Pt was scheduled to receive the HD in the ICU.

## 2020-08-16 NOTE — Progress Notes (Signed)
Hypoglycemic Event  CBG: 66  Treatment: D50 25 mL (12.5 gm)  Symptoms: None  Follow-up CBG: Time:0003 CBG Result:113  Possible Reasons for Event: Inadequate meal intake  Comments/MD notified:E-link    Lorraine Lax

## 2020-08-16 NOTE — Plan of Care (Signed)

## 2020-08-16 NOTE — Progress Notes (Signed)
\  Pharmacy Electrolyte Replacement  Recent Labs:  Recent Labs    08/15/20 2023 08/16/20 0256 08/16/20 0328  K 3.1* 3.9 4.0  MG 1.6*  --   --   PHOS 2.9  --   --   CREATININE 5.04* 5.41*  --    No critical values noted.   Plan: supplement 2 g magnesium sulfate IV x1 dose   Adria Dill, PharmD PGY-1 Acute Care Resident  08/16/2020 6:49 AM

## 2020-08-17 ENCOUNTER — Inpatient Hospital Stay (HOSPITAL_COMMUNITY): Payer: Medicare Other

## 2020-08-17 ENCOUNTER — Other Ambulatory Visit: Payer: Self-pay

## 2020-08-17 DIAGNOSIS — I5033 Acute on chronic diastolic (congestive) heart failure: Secondary | ICD-10-CM

## 2020-08-17 DIAGNOSIS — G9341 Metabolic encephalopathy: Secondary | ICD-10-CM

## 2020-08-17 LAB — GLUCOSE, CAPILLARY
Glucose-Capillary: 103 mg/dL — ABNORMAL HIGH (ref 70–99)
Glucose-Capillary: 114 mg/dL — ABNORMAL HIGH (ref 70–99)
Glucose-Capillary: 77 mg/dL (ref 70–99)
Glucose-Capillary: 89 mg/dL (ref 70–99)
Glucose-Capillary: 94 mg/dL (ref 70–99)
Glucose-Capillary: 97 mg/dL (ref 70–99)
Glucose-Capillary: 97 mg/dL (ref 70–99)

## 2020-08-17 LAB — BASIC METABOLIC PANEL
Anion gap: 8 (ref 5–15)
BUN: 13 mg/dL (ref 8–23)
CO2: 23 mmol/L (ref 22–32)
Calcium: 7.9 mg/dL — ABNORMAL LOW (ref 8.9–10.3)
Chloride: 102 mmol/L (ref 98–111)
Creatinine, Ser: 4.84 mg/dL — ABNORMAL HIGH (ref 0.44–1.00)
GFR, Estimated: 10 mL/min — ABNORMAL LOW (ref >60)
Glucose, Bld: 91 mg/dL (ref 70–99)
Potassium: 4.7 mmol/L (ref 3.5–5.1)
Sodium: 133 mmol/L — ABNORMAL LOW (ref 135–145)

## 2020-08-17 LAB — CBC
HCT: 29.6 % — ABNORMAL LOW (ref 36.0–46.0)
Hemoglobin: 9.3 g/dL — ABNORMAL LOW (ref 12.0–15.0)
MCH: 29.1 pg (ref 26.0–34.0)
MCHC: 31.4 g/dL (ref 30.0–36.0)
MCV: 92.5 fL (ref 80.0–100.0)
Platelets: 103 10*3/uL — ABNORMAL LOW (ref 150–400)
RBC: 3.2 MIL/uL — ABNORMAL LOW (ref 3.87–5.11)
RDW: 14.5 % (ref 11.5–15.5)
WBC: 5.8 10*3/uL (ref 4.0–10.5)
nRBC: 0 % (ref 0.0–0.2)

## 2020-08-17 LAB — ECHOCARDIOGRAM COMPLETE
Area-P 1/2: 5.75 cm2
Height: 62 in
S' Lateral: 3.7 cm
Weight: 2035.29 oz

## 2020-08-17 LAB — HEPATITIS B SURFACE ANTIGEN: Hepatitis B Surface Ag: NONREACTIVE

## 2020-08-17 MED ORDER — HEPARIN SODIUM (PORCINE) 1000 UNIT/ML DIALYSIS: 2000.0000 [IU] | INTRAMUSCULAR | Status: DC | PRN

## 2020-08-17 MED ORDER — POLYETHYLENE GLYCOL 3350 17 G PO PACK: 17.0000 g | PACK | Freq: Every day | ORAL | Status: DC | PRN

## 2020-08-17 MED ORDER — NIFEDIPINE ER OSMOTIC RELEASE 60 MG PO TB24
60.0000 mg | ORAL_TABLET | Freq: Every day | ORAL | Status: DC
  Administered 2020-08-17 – 2020-08-18 (×2): 60 mg via ORAL
  Filled 2020-08-17 (×2): qty 1

## 2020-08-17 MED ORDER — LEVETIRACETAM 100 MG/ML PO SOLN
500.0000 mg | Freq: Every day | ORAL | Status: DC
  Filled 2020-08-17: qty 5

## 2020-08-17 MED ORDER — DOCUSATE SODIUM 100 MG PO CAPS: 100.0000 mg | ORAL_CAPSULE | Freq: Two times a day (BID) | ORAL | Status: DC | PRN

## 2020-08-17 MED ORDER — LEVETIRACETAM 250 MG PO TABS: 250.0000 mg | ORAL_TABLET | ORAL | Status: DC

## 2020-08-17 MED ORDER — ACETAMINOPHEN 500 MG PO TABS: 500.0000 mg | ORAL_TABLET | Freq: Four times a day (QID) | ORAL | Status: DC | PRN

## 2020-08-17 MED ORDER — HEPARIN SODIUM (PORCINE) 1000 UNIT/ML IJ SOLN
INTRAMUSCULAR | Status: AC
  Filled 2020-08-17: qty 1

## 2020-08-17 NOTE — Progress Notes (Addendum)
Patient ID: Barbara Crawford, female   DOB: October 30, 1955, 65 y.o.   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  In the ED, patient was confused, hypoxic and was subsequently intubated.  Chest x-ray was consistent with pulmonary edema.  She was admitted under PCCM service.  Nephrology was consulted and patient underwent hemodialysis.  She was extubated on 08/16/2020.  She was transferred to [facility] service on 08/17/2020.  Assessment & Plan:   Acute hypoxic respiratory failure secondary to pulmonary edema/volume overload -Patient was intubated on presentation and admitted to ICU under PCCM service.  Extubated on 08/16/2020.  Transferred to [facility] service on 08/17/2020 -Respiratory status improved with dialysis inpatient.  Currently on room air.  End-stage renal disease on hemodialysis -Nephrology following.  Patient is tolerating inpatient hemodialysis.  Dialysis as per nephrology schedule.  Acute metabolic encephalopathy -Patient presented confused and combative.  Mental status much improved.  History of seizures -No seizures in the hospital.  Keppra discontinued by her provider as an outpatient  Hypertension -blood pressure on the higher side -Continue metoprolol  Hypoglycemia -On D10 drip.  Now eating:  will DC the insulin drip.  General deconditioning -PT eval    DVT prophylaxis: Heparin Code Status: Full Family Communication: None at bedside Disposition Plan: Status is: Inpatient  Remains inpatient appropriate because:Inpatient level of care appropriate due to severity of illness  Dispo: The  patient is from: Home              Anticipated d/c is to: Home              Patient currently is not medically stable to d/c.   Difficult to place patient No  Consultants: PCCM/nephrology  Procedures: Intubation/extubation  Antimicrobials: None   Subjective: Patient seen and examined at bedside.  Denies worsening shortness of breath, chest pain, nausea or vomiting.  Feels better.  Objective: Vitals:   08/17/20 0900 08/17/20 1000 08/17/20 1100 08/17/20 1200  BP: (!) 141/82 (!) 143/79 (!) 150/87 (!) 143/91  Pulse: 100 99 (!) 106 (!) 104  Resp: (!) 23 (!) '22 19 19  '$ Temp:    99.2 F (37.3 C)  TempSrc:    Oral  SpO2: 97% 97% 100% 100%  Weight:      Height:        Intake/Output Summary (Last 24 hours) at 08/17/2020 1316 Last data filed at 08/17/2020 1200 Gross per 24 hour  Intake 458.77 ml  Output 0 ml  Net 458.77 ml   Filed Weights   08/16/20 0412 08/16/20 0615 08/17/20 0400  Weight: 61.9 kg 60.2 kg 57.7 kg    Examination:  General exam: Appears calm and comfortable.  Currently on room air. Respiratory system: Bilateral decreased breath sounds at bases with some scattered crackles Cardiovascular system: S1 & S2 heard, tachycardic  gastrointestinal system: Abdomen is nondistended, soft and nontender. Normal bowel sounds heard. Extremities: No cyanosis, clubbing; bilateral lower extremity edema present Central nervous system: Alert and oriented. No focal neurological deficits. Moving extremities Skin: No rashes, lesions or ulcers Psychiatry: Judgement and insight appear normal. Mood & affect appropriate.     Data Reviewed: I have personally reviewed  following labs and imaging studies  CBC: Recent Labs  Lab 08/15/20 1520 08/15/20 1615 08/16/20 0256 08/16/20 0328 08/17/20 0158  WBC 5.9  --  3.3*  --  5.8  HGB 10.4* 10.2* 8.5* 9.2* 9.3*  HCT 34.5* 30.0* 26.3* 27.0* 29.6*  MCV 96.1  --  90.1  --  92.5  PLT 113*  --  105*  --  [id number]*   Basic Metabolic  Panel: Recent Labs  Lab 08/15/20 1520 08/15/20 1615 08/15/20 2023 08/16/20 0256 08/16/20 0328 08/17/20 0158  NA 138 142 139 138 139 133*  K 2.4* 2.6* 3.1* 3.9 4.0 4.7  CL 103  --  103 106  --  102  CO2 21*  --  24 25  --  23  GLUCOSE 195*  --  73 84  --  91  BUN 13  --  15 18  --  13  CREATININE 4.67*  --  5.04* 5.41*  --  4.84*  CALCIUM 7.9*  --  7.8* 7.6*  --  7.9*  MG  --   --  1.6*  --   --   --   PHOS  --   --  2.9  --   --   --    GFR: Estimated Creatinine Clearance: 9.4 mL/min (A) (by C-G formula based on SCr of 4.84 mg/dL (H)). Liver Function Tests: No results for input(s): AST, ALT, ALKPHOS, BILITOT, PROT, ALBUMIN in the last 168 hours. No results for input(s): LIPASE, AMYLASE in the last 168 hours. No results for input(s): AMMONIA in the last 168 hours. Coagulation Profile: No results for input(s): INR, PROTIME in the last 168 hours. Cardiac Enzymes: No results for input(s): CKTOTAL, CKMB, CKMBINDEX, TROPONINI in the last 168 hours. BNP (last 3 results) No results for input(s): PROBNP in the last 8760 hours. HbA1C: Recent Labs    08/15/20 2023  HGBA1C 5.4   CBG: Recent Labs  Lab 08/16/20 2005 08/17/20 0001 08/17/20 0402 08/17/20 0734 08/17/20 1200  GLUCAP 89 89 103* 97 97   Lipid Profile: Recent Labs    08/16/20 0256  TRIG 68   Thyroid Function Tests: No results for input(s): TSH, T4TOTAL, FREET4, T3FREE, THYROIDAB in the last 72 hours. Anemia Panel: No results for input(s): VITAMINB12, FOLATE, FERRITIN, TIBC, IRON, RETICCTPCT in the last 72 hours. Sepsis Labs: No results for input(s): PROCALCITON, LATICACIDVEN in the last 168 hours.  Recent Results (from the past 240 hour(s))  Resp Panel by RT-PCR (Flu A&B, Covid) Nasopharyngeal Swab     Status: None   Collection Time: 08/15/20  3:22 PM   Specimen: Nasopharyngeal Swab; Nasopharyngeal(NP) swabs in vial transport medium  Result Value Ref Range Status   SARS Coronavirus 2 by RT PCR NEGATIVE  NEGATIVE Final    Comment: (NOTE) SARS-CoV-2 target nucleic acids are NOT DETECTED.  The SARS-CoV-2 RNA is generally detectable in upper respiratory specimens during the acute phase of infection. The lowest concentration of SARS-CoV-2 viral copies this assay can detect is 138 copies/mL. A negative result does not preclude SARS-Cov-2 infection and should not be used as the sole basis for treatment or other patient management decisions. A negative result may occur with  improper specimen collection/handling, submission of specimen other than nasopharyngeal swab, presence of viral mutation(s) within the areas targeted by this assay, and inadequate number of viral copies(<138 copies/mL). A negative result must be combined with clinical observations, patient history, and epidemiological information. The expected result is Negative.  Fact Sheet for Patients:  EntrepreneurPulse.com.au  Fact Sheet for Healthcare Providers:  IncredibleEmployment.be  This test is no t yet approved or cleared by the [place] FDA and  has been authorized for detection and/or diagnosis of SARS-CoV-2 by FDA under an Emergency Use Authorization (EUA). This EUA will remain  in effect (meaning this test can be used) for the duration of the COVID-19 declaration under Section 564(b)(1) of the Act, 21 U.S.C.section 360bbb-3(b)(1), unless the authorization is terminated  or revoked sooner.       Influenza A by PCR NEGATIVE NEGATIVE Final   Influenza B by PCR NEGATIVE NEGATIVE Final    Comment: (NOTE) The Xpert Xpress SARS-CoV-2/FLU/RSV plus assay is intended as an aid in the diagnosis of influenza from Nasopharyngeal swab specimens and should not be used as a sole basis for treatment. Nasal washings and aspirates are unacceptable for Xpert Xpress SARS-CoV-2/FLU/RSV testing.  Fact Sheet for Patients: EntrepreneurPulse.com.au  Fact Sheet for Healthcare  Providers: IncredibleEmployment.be  This test is not yet approved or cleared by the [place] FDA and has been authorized for detection and/or diagnosis of SARS-CoV-2 by FDA under an Emergency Use Authorization (EUA). This EUA will remain in effect (meaning this test can be used) for the duration of the COVID-19 declaration under Section 564(b)(1) of the Act, 21 U.S.C. section 360bbb-3(b)(1), unless the authorization is terminated or revoked.  Performed at [facility], [place] [address] [place], [place] [address]   Culture, blood (routine x 2)     Status: None (Preliminary result)   Collection Time: 08/15/20  8:23 PM   Specimen: BLOOD  Result Value Ref Range Status   Specimen Description BLOOD LEFT HAND  Final   Special Requests   Final    BOTTLES DRAWN AEROBIC ONLY Blood Culture results may not be optimal due to an inadequate volume of blood received in culture bottles   Culture   Final    NO GROWTH 2 DAYS Performed at [facility], [place] [address], [place], [place] [address]    Report Status PENDING  Incomplete  Culture, blood (routine x 2)     Status: None (Preliminary result)   Collection Time: 08/15/20  8:23 PM   Specimen: BLOOD  Result Value Ref Range Status   Specimen Description BLOOD RIGHT HAND  Final   Special Requests   Final    BOTTLES DRAWN AEROBIC ONLY Blood Culture results may not be optimal due to an inadequate volume of blood received in culture bottles   Culture   Final    NO GROWTH 2 DAYS Performed at [facility], [place] [address] [place], [place] [address]    Report Status PENDING  Incomplete  MRSA Next Gen by PCR, Nasal     Status: None   Collection Time: 08/15/20  9:49 PM   Specimen: Nasal Mucosa; Nasal Swab  Result Value Ref Range Status   MRSA by PCR Next Gen NOT DETECTED NOT DETECTED Final    Comment: (NOTE) The GeneXpert MRSA Assay (FDA approved for NASAL specimens only), is one component of a comprehensive  MRSA colonization surveillance program. It is not intended to diagnose MRSA infection nor to guide or monitor treatment for MRSA infections. Test performance is not FDA approved in patients less than 3 years old. Performed at [facility], [place] [address] [place],  [address]          Radiology Studies: DG Chest Port 1 View  Result Date: 08/16/2020 CLINICAL DATA:  Patient's  respiratory dependent. Evaluate ETT. Pneumothorax. EXAM: PORTABLE CHEST 1 VIEW COMPARISON:  August 15, 2020 FINDINGS: The ETT is in good position. The NG tube terminates below today's film. The right dialysis catheter is stable. Opacity in left brace obscures the left hemidiaphragm, increased in the interval, likely atelectasis. The diffuse opacities in the lungs have resolved on the left and mildly improved on the right. Probable small layering right effusion. IMPRESSION: 1. The left-sided opacity on the previous study has resolved. The right-sided diffuse opacity has improved but persists. Probable layering effusion on the right. Findings are suspected to represent improving now asymmetric edema. An infectious process is not completely excluded. 2. Support apparatus as above. 3. No other abnormalities. Electronically Signed   By: Dorise Bullion III M.D.   On: 08/16/2020 10:36   DG Chest Portable 1 View  Result Date: 08/15/2020 CLINICAL DATA:  Post intubation EXAM: PORTABLE CHEST 1 VIEW COMPARISON:  March 13, 2020 FINDINGS: A double lumen catheter consistent with a dialysis catheter is stable terminating in the central SVC. No pneumothorax. Diffuse bilateral pulmonary opacities. Mild cardiomegaly. The ETT is in good position. No other acute abnormalities. IMPRESSION: Findings are most consistent with cardiomegaly and developing pulmonary edema. A diffuse infectious process could have a similar appearance in the appropriate clinical setting. Electronically Signed   By: Dorise Bullion III M.D.   On: 08/15/2020  15:38        Scheduled Meds:  heparin injection (subcutaneous)  5,000 Units Subcutaneous Q8H   mouth rinse  15 mL Mouth Rinse 10 times per day   metoprolol tartrate  25 mg Oral BID   Continuous Infusions:  sodium chloride            Aline August, MD [facility] 08/17/2020, 1:16 PM

## 2020-08-17 NOTE — Progress Notes (Signed)
Physical Therapy Evaluation Patient Details Name: Barbara Crawford MRN: [id number] DOB: [birth year] Today's Date: 08/17/2020   History of Present Illness  65 year old woman with ESRD on HD brought in by EMS for acute onset shortness of breath after being on dialysis for about an hour, combative on route and in the ED 8/13, confused, hypoxic and intubated 8/13-8/14 .  Found to have acute pulm edema and hypokalemia.  Clinical Impression  Pt admitted with above diagnosis. Pt was able to take a few pivotal steps to the recliner with assist.  Pt felt too weak to walk in room today.  Pt mostly steady with standing needing HHA to pivot. Should progress well.  Pt currently with functional limitations due to the deficits listed below (see PT Problem List). Pt will benefit from skilled PT to increase their independence and safety with mobility to allow discharge to the venue listed below.       Follow Up Recommendations Home health PT    Equipment Recommendations  None recommended by PT    Recommendations for Other Services       Precautions / Restrictions Precautions Precautions: Fall Restrictions Weight Bearing Restrictions: No      Mobility  Bed Mobility Overal bed mobility: Needs Assistance Bed Mobility: Supine to Sit     Supine to sit: Min guard     General bed mobility comments: A little guidance to EOB.    Transfers Overall transfer level: Needs assistance Equipment used: 2 person hand held assist Transfers: Sit to/from [facility] Sit to Stand: Min guard Stand pivot transfers: Min guard       General transfer comment: Pt slow to move but was able to stand and pivot to the recliner.  Ambulation/Gait                Stairs            Wheelchair Mobility    Modified Rankin (Stroke Patients Only)       Balance Overall balance assessment: Needs assistance Sitting-balance support: No upper extremity supported;Feet  supported Sitting balance-Leahy Scale: Fair     Standing balance support: Bilateral upper extremity supported;During functional activity Standing balance-Leahy Scale: Poor Standing balance comment: relies on UE support for balance.                             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     Hand Dominance   Dominant Hand: Right    Extremity/Trunk Assessment   Upper Extremity Assessment Upper Extremity Assessment: Defer to OT evaluation    Lower Extremity Assessment Lower Extremity Assessment: Generalized weakness    Cervical / Trunk Assessment Cervical / Trunk Assessment: Normal  Communication   Communication: No difficulties  Cognition Arousal/Alertness: Awake/alert Behavior During Therapy: WFL for tasks assessed/performed Overall Cognitive Status: Within Functional Limits for tasks assessed  General Comments General comments (skin integrity, edema, etc.): 106 bpm, 99% RA, 143/79    Exercises     Assessment/Plan    PT Assessment Patient needs continued PT services  PT Problem List Decreased mobility;Decreased balance;Decreased activity tolerance;Decreased knowledge of use of DME;Decreased safety awareness;Decreased knowledge of precautions;Cardiopulmonary status limiting activity       PT Treatment Interventions DME instruction;Gait  training;Functional mobility training;Therapeutic activities;Therapeutic exercise;Balance training;Patient/family education    PT Goals (Current goals can be found in the Care Plan section)  Acute Rehab PT Goals Patient Stated Goal: to go home PT Goal Formulation: With patient Time For Goal Achievement: 08/31/20 Potential to Achieve Goals: Good    Frequency Min 3X/week   Barriers to discharge        Co-evaluation               AM-PAC PT "6 Clicks" Mobility  Outcome Measure Help needed turning from your back to your side while in a flat bed without using bedrails?: A Little Help needed moving from lying on your back to sitting on the side of a flat bed without using bedrails?: A Little Help needed moving to and from a bed to a chair (including a wheelchair)?: A Little Help needed standing up from a chair using your arms (e.g., wheelchair or bedside chair)?: A Little Help needed to walk in hospital room?: A Little Help needed climbing 3-5 steps with a railing? : A Little 6 Click Score: 18    End of Session Equipment Utilized During Treatment: Gait belt Activity Tolerance: Patient limited by fatigue Patient left: in chair;with call bell/phone within reach;with chair alarm set Nurse Communication: Mobility status PT Visit Diagnosis: Muscle weakness (generalized) (M62.81)    Time: 1041-1101 PT Time Calculation (min) (ACUTE ONLY): 20 min   Charges:   PT Evaluation $PT Eval Moderate Complexity: 1 Mod          Carlise Stofer M,PT Acute Rehab Services [phone] (pager)   Alvira Philips 08/17/2020, 1:31 PM

## 2020-08-17 NOTE — Plan of Care (Signed)
  Problem: Education: Goal: Knowledge of General Education information will improve Description Including pain rating scale, medication(s)/side effects and non-pharmacologic comfort measures Outcome: Progressing   

## 2020-08-17 NOTE — Progress Notes (Signed)
Patient transported via Bed to Hemodialysis with monitor

## 2020-08-17 NOTE — Progress Notes (Signed)
  Echocardiogram 2D Echocardiogram has been performed.  Chessa Barrasso G Mishelle Hassan 08/17/2020, 1:08 PM

## 2020-08-17 NOTE — Progress Notes (Signed)
This Rn offered patient bath, pt. Declined bath and stated would like bath in the morning. Rn will continue to monitor patient.

## 2020-08-17 NOTE — Progress Notes (Signed)
  [facility] Progress Note   Assessment/ Plan:   OP HD: pending     Assessment/ Plan: Acute resp failure/ pulm edema: likely d/t volume overload- improving. BP is improving as well.  Had HD Saturday, next planned for Tuesday.   ESRD - on HD TTS, records still pending.  Next HD planned for tomorrow HTN - BP's sig high on presentation--> now better and only on metoprolol 25 mg BID H/o COVID infection 12/2019 H/o seizures - on Keppra Hypoglycemia: on D10 infusion Dispo: pending  Subjective:    Seen in room.  Extubated, eating breakfast.  No complaints.  BP is fairly well-controlled.     Objective:   BP (!) 143/79   Pulse 99   Temp 100 F (37.8 C) (Oral)   Resp (!) 22   Ht '5\' 2"'$  (1.575 m)   Wt 57.7 kg   SpO2 97%   BMI 23.27 kg/m   Physical Exam: Gen: NAD, sitting in bed off O2  CVS: RRR Resp: clear Abd: soft Ext: no LE edema, 1+ R forearm edema ACCESS: R [facility]   Labs: BMET Recent Labs  Lab 08/15/20 1510 08/15/20 1520 08/15/20 1615 08/15/20 2023 08/16/20 0256 08/16/20 0328 08/17/20 0158  NA 142 138 142 139 138 139 133*  K 3.7 2.4* 2.6* 3.1* 3.9 4.0 4.7  CL  --  103  --  103 106  --  102  CO2  --  21*  --  24 25  --  23  GLUCOSE  --  195*  --  73 84  --  91  BUN  --  13  --  15 18  --  13  CREATININE  --  4.67*  --  5.04* 5.41*  --  4.84*  CALCIUM  --  7.9*  --  7.8* 7.6*  --  7.9*  PHOS  --   --   --  2.9  --   --   --    CBC Recent Labs  Lab 08/15/20 1520 08/15/20 1615 08/16/20 0256 08/16/20 0328 08/17/20 0158  WBC 5.9  --  3.3*  --  5.8  HGB 10.4* 10.2* 8.5* 9.2* 9.3*  HCT 34.5* 30.0* 26.3* 27.0* 29.6*  MCV 96.1  --  90.1  --  92.5  PLT 113*  --  105*  --  103*      Medications:     heparin injection (subcutaneous)  5,000 Units Subcutaneous Q8H   mouth rinse  15 mL Mouth Rinse 10 times per day   metoprolol tartrate  25 mg Oral BID     Madelon Lips MD [facility]gr [phone] 08/17/2020, 11:29 AM

## 2020-08-18 DIAGNOSIS — E8770 Fluid overload, unspecified: Secondary | ICD-10-CM

## 2020-08-18 LAB — CBC WITH DIFFERENTIAL/PLATELET
Abs Immature Granulocytes: 0.03 10*3/uL (ref 0.00–0.07)
Basophils Absolute: 0.1 10*3/uL (ref 0.0–0.1)
Basophils Relative: 1 %
Eosinophils Absolute: 0 10*3/uL (ref 0.0–0.5)
Eosinophils Relative: 0 %
HCT: 29.8 % — ABNORMAL LOW (ref 36.0–46.0)
Hemoglobin: 9.3 g/dL — ABNORMAL LOW (ref 12.0–15.0)
Immature Granulocytes: 0 %
Lymphocytes Relative: 11 %
Lymphs Abs: 0.9 10*3/uL (ref 0.7–4.0)
MCH: 28.9 pg (ref 26.0–34.0)
MCHC: 31.2 g/dL (ref 30.0–36.0)
MCV: 92.5 fL (ref 80.0–100.0)
Monocytes Absolute: 0.9 10*3/uL (ref 0.1–1.0)
Monocytes Relative: 11 %
Neutro Abs: 6 10*3/uL (ref 1.7–7.7)
Neutrophils Relative %: 77 %
Platelets: 101 10*3/uL — ABNORMAL LOW (ref 150–400)
RBC: 3.22 MIL/uL — ABNORMAL LOW (ref 3.87–5.11)
RDW: 14.1 % (ref 11.5–15.5)
WBC: 7.9 10*3/uL (ref 4.0–10.5)
nRBC: 0 % (ref 0.0–0.2)

## 2020-08-18 LAB — GLUCOSE, CAPILLARY
Glucose-Capillary: 91 mg/dL (ref 70–99)
Glucose-Capillary: 78 mg/dL (ref 70–99)
Glucose-Capillary: 82 mg/dL (ref 70–99)
Glucose-Capillary: 106 mg/dL — ABNORMAL HIGH (ref 70–99)

## 2020-08-18 MED ORDER — METOPROLOL TARTRATE 25 MG PO TABS: 25.0000 mg | ORAL_TABLET | Freq: Two times a day (BID) | ORAL | 0 refills | Status: DC

## 2020-08-18 NOTE — TOC Transition Note (Addendum)
Transition of Care [facility]) - CM/SW Discharge Note   Patient Details  Name: Barbara Crawford MRN: [id number] Date of Birth: 1955-04-04  Transition of Care [facility]) CM/SW Contact:  Tom-Johnson, Renea Ee, RN Phone Number: 08/18/2020, 1:59 PM   Clinical Narrative:    Spoke with Steffanie Dunn [phone]) at [place] and agreed to give PT services at discharge. First visit will be on 08/20/20. Will call patient to schedule time. Patient lives with son and daughter. Daughter to pickup at discharge. No DME needs at this time. No further TOC at this time.  1522: Received a call from [place] at [facility] that they will not be able to provide services to patient due to patient's insurance out of net work. CM called Danae Chen at [facility] care and accepted to provide PT service. Patient updated and agency will call patient for first visit. Agency info on the AVS.   Final next level of care: [place] Barriers to Discharge: No Barriers Identified   Patient Goals and CMS Choice Patient states their goals for this hospitalization and ongoing recovery are:: To ggo home CMS Medicare.gov Compare Post Acute Care list provided to:: Patient    Discharge Placement                       Discharge Plan and Services                          HH Arranged: PT [facility] Agency: [facility] Date [facility] Agency Contacted: 08/18/20 Time [place]: 1130 Representative spoke with at [place]: [place] ([place]) Interventions     Readmission Risk Interventions No flowsheet data found.

## 2020-08-18 NOTE — Progress Notes (Addendum)
Patient given discharge instructions; AVS printed, reviewed, and given to pt; pt discharged via wheelchair to [place] with her son; she is stable at the time of discharge.

## 2020-08-18 NOTE — Progress Notes (Signed)
  [facility] Progress Note   Assessment/ Plan:   OP HD: pending     Assessment/ Plan: Acute resp failure/ pulm edema: likely d/t volume overload- resolved now.  TTE 08/17/20 with EF 45% and G2DD, global hypokinesis.    ESRD - on HD TTS, had HD Monday off schedule, next HD Thursday on schedule and then Saturday.   HTN - BP's sig high on presentation--> now better and only on metoprolol 25 mg BID--> I think volume had a lot to do with high BP  I discussed with her not to take her BP meds before dialysis so she could get all her fluid off.  She says that her EDW was 59 kg as OP and we have got her down to 53.5 kg which should be her new EDW. H/o COVID infection 12/2019 H/o seizures - on Keppra Hypoglycemia: on D10 infusion--> now off Dispo: OK to go from renal perspective  Subjective:    HD yesterday with 3L off.  TTE with EF 45% and G2DD, global hypokinesis.     Objective:   BP 125/84   Pulse (!) 101   Temp (!) 97.5 F (36.4 C) (Oral)   Resp (!) 21   Ht '5\' 2"'$  (1.575 m)   Wt 53.5 kg   SpO2 97%   BMI 21.57 kg/m   Physical Exam: Gen: NAD, sitting in bed off O2  CVS: RRR Resp: clear Abd: soft Ext: no LE edema,  ACCESS: R TDC   Labs: BMET Recent Labs  Lab 08/15/20 1510 08/15/20 1520 08/15/20 1615 08/15/20 2023 08/16/20 0256 08/16/20 0328 08/17/20 0158  NA 142 138 142 139 138 139 133*  K 3.7 2.4* 2.6* 3.1* 3.9 4.0 4.7  CL  --  103  --  103 106  --  102  CO2  --  21*  --  24 25  --  23  GLUCOSE  --  195*  --  73 84  --  91  BUN  --  13  --  15 18  --  13  CREATININE  --  4.67*  --  5.04* 5.41*  --  4.84*  CALCIUM  --  7.9*  --  7.8* 7.6*  --  7.9*  PHOS  --   --   --  2.9  --   --   --    CBC Recent Labs  Lab 08/15/20 1520 08/15/20 1615 08/16/20 0256 08/16/20 0328 08/17/20 0158 08/18/20 0102  WBC 5.9  --  3.3*  --  5.8 7.9  NEUTROABS  --   --   --   --   --  6.0  HGB 10.4*   < > 8.5* 9.2* 9.3* 9.3*  HCT 34.5*   < > 26.3* 27.0* 29.6* 29.8*   MCV 96.1  --  90.1  --  92.5 92.5  PLT 113*  --  105*  --  103* 101*   < > = values in this interval not displayed.      Medications:     heparin injection (subcutaneous)  5,000 Units Subcutaneous Q8H   metoprolol tartrate  25 mg Oral BID   NIFEdipine  60 mg Oral Daily     Madelon Lips MD [facility] Pgr [phone] 08/18/2020, 11:42 AM

## 2020-08-18 NOTE — Discharge Summary (Signed)
Physician Discharge Summary  Barbara Crawford [id number] DOB: [birth year] DOA: 08/15/2020  PCP: Vonna Drafts, FNP  Admit date: 08/15/2020 Discharge date: 08/18/2020  Admitted From: Home Disposition: Home  Recommendations for Outpatient Follow-up:  Follow up with PCP in 1 week with repeat CBC/BMP Outpatient follow-up with dialysis unit as scheduled Follow up in ED if symptoms worsen or new appear   Home Health: Home with PT Equipment/Devices: None  Discharge Condition: Stable CODE STATUS: Full Diet recommendation: Heart healthy/renal hemodialysis diet with fluid restriction of up to 1200 cc a day  Brief/Interim Summary: 65 year old female with 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  In the ED, patient was confused, hypoxic and was subsequently intubated.  Chest x-ray was consistent with pulmonary edema.  She was admitted under PCCM service.  Nephrology was consulted and patient underwent hemodialysis.  She was extubated on 08/16/2020.  She was transferred to [facility] service on 08/17/2020.  Subsequently she has remained stable.  PT recommended home with PT.  Nephrology has cleared the patient for discharge.  She will be discharged home today with outpatient follow-up with PCP in a week and dialysis unit as scheduled.  Discharge Diagnoses:   Acute hypoxic respiratory failure secondary to pulmonary edema/volume overload -Patient was intubated on presentation and admitted to ICU under PCCM service.  Extubated on 08/16/2020.  Transferred to [facility] service on 08/17/2020 -Respiratory status improved with dialysis inpatient.  Currently on room air.  Outpatient follow-up.   End-stage renal disease on hemodialysis -Nephrology following.  Patient is tolerating inpatient  hemodialysis.  Dialysis as per nephrology schedule. -Nephrology has cleared the patient for discharge home today.  Discharge patient home today with outpatient follow-up with dialysis unit as scheduled.  Acute metabolic encephalopathy -Patient presented confused and combative.  Mental status much improved and back to baseline.  History of seizures -No seizures in the hospital.  Keppra discontinued by her provider as an outpatient  Hypertension -blood pressure intermittently on the lower side.-Continue metoprolol at low-dose along with nifedipine.   Hypoglycemia -Due to n.p.o. status.  Treated with D10 drip.  Subsequently D10 drip has been discontinued.  Outpatient follow-up.  Tolerating diet well.  General deconditioning -PT recommends home health PT.  Discharge Instructions  Discharge Instructions     Diet - low sodium heart healthy   Complete by: As directed    Renal HD diet   Increase activity slowly   Complete by: As directed    No wound care   Complete by: As directed       Allergies as of 08/18/2020       Reactions   Shellfish-derived Products Swelling, Other (See Comments)   Sweat and Dizzy   Erythromycin Other (See Comments)   Pt does not recall   Penicillins    Has patient had a PCN reaction causing immediate rash, facial/tongue/throat swelling, SOB or lightheadedness with hypotension: no Has patient had a PCN reaction causing severe rash involving mucus membranes or skin necrosis: yes Has patient had a PCN reaction that required hospitalization: no Has patient had a PCN reaction occurring within the last 10 years: no If all of the above answers are "NO", then may proceed with Cephalosporin use.   Chlorhexidine Rash   Iodine Rash, Other (See Comments)   Arm flares up per pt  Medication List     STOP taking these medications    levETIRAcetam 250 MG tablet Commonly known as: KEPPRA   levETIRAcetam 500 MG tablet Commonly known as: KEPPRA        TAKE these medications    acetaminophen 500 MG tablet Commonly known as: TYLENOL Take 500 mg by mouth every 6 (six) hours as needed for mild pain or moderate pain.   aspirin 81 MG chewable tablet Chew 81 mg by mouth daily.   ferric citrate 1 GM 210 MG(Fe) tablet Commonly known as: AURYXIA Take 210 mg by mouth 3 (three) times daily with meals.   metoprolol tartrate 25 MG tablet Commonly known as: LOPRESSOR Take 1 tablet (25 mg total) by mouth 2 (two) times daily. What changed:  medication strength how much to take   NIFEdipine 60 MG 24 hr tablet Commonly known as: ADALAT CC Take 60 mg by mouth daily.        Follow-up Information     Vonna Drafts, FNP. Schedule an appointment as soon as possible for a visit in 1 week(s).   Specialty: Nurse Practitioner Contact information: [address] [place] [address] [phone]                Allergies  Allergen Reactions   Shellfish-Derived Products Swelling and Other (See Comments)    Sweat and Dizzy   Erythromycin Other (See Comments)    Pt does not recall   Penicillins     Has patient had a PCN reaction causing immediate rash, facial/tongue/throat swelling, SOB or lightheadedness with hypotension: no Has patient had a PCN reaction causing severe rash involving mucus membranes or skin necrosis: yes Has patient had a PCN reaction that required hospitalization: no Has patient had a PCN reaction occurring within the last 10 years: no If all of the above answers are "NO", then may proceed with Cephalosporin use.   Chlorhexidine Rash   Iodine Rash and Other (See Comments)    Arm flares up per pt    Consultations: PCCM/nephrology   Procedures/Studies: DG Chest Port 1 View  Result Date: 08/16/2020 CLINICAL DATA:  Patient's respiratory dependent. Evaluate ETT. Pneumothorax. EXAM: PORTABLE CHEST 1 VIEW COMPARISON:  August 15, 2020 FINDINGS: The ETT is in good position. The NG tube  terminates below today's film. The right dialysis catheter is stable. Opacity in left brace obscures the left hemidiaphragm, increased in the interval, likely atelectasis. The diffuse opacities in the lungs have resolved on the left and mildly improved on the right. Probable small layering right effusion. IMPRESSION: 1. The left-sided opacity on the previous study has resolved. The right-sided diffuse opacity has improved but persists. Probable layering effusion on the right. Findings are suspected to represent improving now asymmetric edema. An infectious process is not completely excluded. 2. Support apparatus as above. 3. No other abnormalities. Electronically Signed   By: Dorise Bullion III M.D.   On: 08/16/2020 10:36   DG Chest Portable 1 View  Result Date: 08/15/2020 CLINICAL DATA:  Post intubation EXAM: PORTABLE CHEST 1 VIEW COMPARISON:  March 13, 2020 FINDINGS: A double lumen catheter consistent with a dialysis catheter is stable terminating in the central SVC. No pneumothorax. Diffuse bilateral pulmonary opacities. Mild cardiomegaly. The ETT is in good position. No other acute abnormalities. IMPRESSION: Findings are most consistent with cardiomegaly and developing pulmonary edema. A diffuse infectious process could have a similar appearance in the appropriate clinical setting. Electronically Signed   By: Dorise Bullion  III M.D.   On: 08/15/2020 15:38   ECHOCARDIOGRAM COMPLETE  Result Date: 08/17/2020    ECHOCARDIOGRAM REPORT   Patient Name:   Barbara Crawford Date of Exam: 08/17/2020 Medical Rec #:  [id number]             Height:       62.0 in Accession #:    [id number]            Weight:       127.2 lb Date of Birth:  [birth year]            BSA:          1.577 m Patient Age:    45 years              BP:           143/91 mmHg Patient Gender: F                     HR:           103 bpm. Exam Location:  Inpatient Procedure: 2D Echo, Cardiac Doppler and Color Doppler Indications:    I50.33 Acute  on chronic diastolic (congestive) heart failure  History:        Patient has prior history of Echocardiogram examinations, most                 recent 03/06/2020. Risk Factors:Hypertension. CKD.  Sonographer:    Tiffany Dance RVT Referring Phys: [place]  1. Left ventricular ejection fraction, by estimation, is 45%. The left ventricle has mildly decreased function. The left ventricle demonstrates global hypokinesis. Left ventricular diastolic parameters are consistent with Grade II diastolic dysfunction (pseudonormalization).  2. Right ventricular systolic function is normal. The right ventricular size is normal. There is mildly elevated pulmonary artery systolic pressure. The estimated right ventricular systolic pressure is [id number] mmHg.  3. Left atrial size was moderately dilated.  4. Moderate pleural effusion.  5. The mitral valve is normal in structure. Mild to moderate mitral valve regurgitation. No evidence of mitral stenosis.  6. Tricuspid valve regurgitation is moderate.  7. The aortic valve is tricuspid. There is moderate thickening of the aortic valve. Aortic valve regurgitation is not visualized. No aortic stenosis is present. Comparison(s): A prior study was performed on 03/06/20. LV is less vigorous. FINDINGS  Left Ventricle: Left ventricular ejection fraction, by estimation, is 45%. The left ventricle has mildly decreased function. The left ventricle demonstrates global hypokinesis. The left ventricular internal cavity size was normal in size. There is no left ventricular hypertrophy. Left ventricular diastolic parameters are consistent with Grade II diastolic dysfunction (pseudonormalization). Right Ventricle: The right ventricular size is normal. No increase in right ventricular wall thickness. Right ventricular systolic function is normal. There is mildly elevated pulmonary artery systolic pressure. The tricuspid regurgitant velocity is 2.67  m/s, and with an assumed right atrial  pressure of 8 mmHg, the estimated right ventricular systolic pressure is [id number] mmHg. Left Atrium: Left atrial size was moderately dilated. Right Atrium: Right atrial size was normal in size. Pericardium: There is no evidence of pericardial effusion. Mitral Valve: The mitral valve is normal in structure. Mild to moderate mitral valve regurgitation. No evidence of mitral valve stenosis. Tricuspid Valve: The tricuspid valve is normal in structure. Tricuspid valve regurgitation is moderate. Aortic Valve: The aortic valve is tricuspid. There is moderate thickening of the aortic valve. Aortic valve regurgitation is not visualized. No aortic stenosis is  present. Pulmonic Valve: The pulmonic valve was grossly normal. Pulmonic valve regurgitation is not visualized. No evidence of pulmonic stenosis. Aorta: The aortic root is normal in size and structure. IAS/Shunts: The atrial septum is grossly normal. Additional Comments: There is a moderate pleural effusion.  LEFT VENTRICLE PLAX 2D LVIDd:         4.90 cm  Diastology LVIDs:         3.70 cm  LV e' medial:    6.53 cm/s LV PW:         1.00 cm  LV E/e' medial:  22.2 LV IVS:        1.00 cm  LV e' lateral:   7.40 cm/s LVOT diam:     1.90 cm  LV E/e' lateral: 19.6 LV SV:         44 LV SV Index:   28 LVOT Area:     2.84 cm  RIGHT VENTRICLE             IVC RV Basal diam:  2.90 cm     IVC diam: 1.90 cm RV S prime:     11.70 cm/s TAPSE (M-mode): 2.3 cm LEFT ATRIUM             Index       RIGHT ATRIUM           Index LA diam:        4.00 cm 2.54 cm/m  RA Area:     17.00 cm LA Vol (A2C):   68.1 ml 43.18 ml/m RA Volume:   40.50 ml  25.68 ml/m LA Vol (A4C):   69.0 ml 43.75 ml/m LA Biplane Vol: 68.6 ml 43.49 ml/m  AORTIC VALVE LVOT Vmax:   91.80 cm/s LVOT Vmean:  68.100 cm/s LVOT VTI:    0.156 m  AORTA Ao Root diam: 3.00 cm Ao Asc diam:  2.50 cm MITRAL VALVE                TRICUSPID VALVE MV Area (PHT): 5.75 cm     TR Peak grad:   28.5 mmHg MV Decel Time: 132 msec     TR Vmax:         267.00 cm/s MV E velocity: 145.00 cm/s MV A velocity: 148.00 cm/s  SHUNTS MV E/A ratio:  0.98         Systemic VTI:  0.16 m                             Systemic Diam: 1.90 cm Rudean Haskell MD Electronically signed by Rudean Haskell MD Signature Date/Time: 08/17/2020/2:50:00 PM    Final       Subjective: Patient seen and examined at bedside.  Denies worsening shortness of breath, fever, vomiting.   Discharge Exam: Vitals:   08/18/20 0800 08/18/20 0900  BP: 126/69 110/65  Pulse: 98 97  Resp: (!) 21 (!) 21  Temp:    SpO2: 95% 98%    General: Pt is alert, awake, not in acute distress.  Currently on room air. Cardiovascular: rate controlled, S1/S2 + Respiratory: bilateral decreased breath sounds at bases with some scattered crackles Abdominal: Soft, NT, ND, bowel sounds + Extremities: Trace lower extremity edema; no cyanosis    The results of significant diagnostics from this hospitalization (including imaging, microbiology, ancillary and laboratory) are listed below for reference.     Microbiology: Recent Results (from the past 240 hour(s))  Resp Panel by RT-PCR (  Flu A&B, Covid) Nasopharyngeal Swab     Status: None   Collection Time: 08/15/20  3:22 PM   Specimen: Nasopharyngeal Swab; Nasopharyngeal(NP) swabs in vial transport medium  Result Value Ref Range Status   SARS Coronavirus 2 by RT PCR NEGATIVE NEGATIVE Final    Comment: (NOTE) SARS-CoV-2 target nucleic acids are NOT DETECTED.  The SARS-CoV-2 RNA is generally detectable in upper respiratory specimens during the acute phase of infection. The lowest concentration of SARS-CoV-2 viral copies this assay can detect is 138 copies/mL. A negative result does not preclude SARS-Cov-2 infection and should not be used as the sole basis for treatment or other patient management decisions. A negative result may occur with  improper specimen collection/handling, submission of specimen other than nasopharyngeal swab,  presence of viral mutation(s) within the areas targeted by this assay, and inadequate number of viral copies(<138 copies/mL). A negative result must be combined with clinical observations, patient history, and epidemiological information. The expected result is Negative.  Fact Sheet for Patients:  EntrepreneurPulse.com.au  Fact Sheet for Healthcare Providers:  IncredibleEmployment.be  This test is no t yet approved or cleared by the [place] FDA and  has been authorized for detection and/or diagnosis of SARS-CoV-2 by FDA under an Emergency Use Authorization (EUA). This EUA will remain  in effect (meaning this test can be used) for the duration of the COVID-19 declaration under Section 564(b)(1) of the Act, 21 U.S.C.section 360bbb-3(b)(1), unless the authorization is terminated  or revoked sooner.       Influenza A by PCR NEGATIVE NEGATIVE Final   Influenza B by PCR NEGATIVE NEGATIVE Final    Comment: (NOTE) The Xpert Xpress SARS-CoV-2/FLU/RSV plus assay is intended as an aid in the diagnosis of influenza from Nasopharyngeal swab specimens and should not be used as a sole basis for treatment. Nasal washings and aspirates are unacceptable for Xpert Xpress SARS-CoV-2/FLU/RSV testing.  Fact Sheet for Patients: EntrepreneurPulse.com.au  Fact Sheet for Healthcare Providers: IncredibleEmployment.be  This test is not yet approved or cleared by the [place] FDA and has been authorized for detection and/or diagnosis of SARS-CoV-2 by FDA under an Emergency Use Authorization (EUA). This EUA will remain in effect (meaning this test can be used) for the duration of the COVID-19 declaration under Section 564(b)(1) of the Act, 21 U.S.C. section 360bbb-3(b)(1), unless the authorization is terminated or revoked.  Performed at [facility], [place] [address] [place], [place] [address]   Culture, blood  (routine x 2)     Status: None (Preliminary result)   Collection Time: 08/15/20  8:23 PM   Specimen: BLOOD  Result Value Ref Range Status   Specimen Description BLOOD LEFT HAND  Final   Special Requests   Final    BOTTLES DRAWN AEROBIC ONLY Blood Culture results may not be optimal due to an inadequate volume of blood received in culture bottles   Culture   Final    NO GROWTH 2 DAYS Performed at [facility], [place] [address], [place], [place] [address]    Report Status PENDING  Incomplete  Culture, blood (routine x 2)     Status: None (Preliminary result)   Collection Time: 08/15/20  8:23 PM   Specimen: BLOOD  Result Value Ref Range Status   Specimen Description BLOOD RIGHT HAND  Final   Special Requests   Final    BOTTLES DRAWN AEROBIC ONLY Blood Culture results may not be optimal due to an inadequate volume of blood received in  culture bottles   Culture   Final    NO GROWTH 2 DAYS Performed at [facility], [place] [address] [place], [place] [address]    Report Status PENDING  Incomplete  MRSA Next Gen by PCR, Nasal     Status: None   Collection Time: 08/15/20  9:49 PM   Specimen: Nasal Mucosa; Nasal Swab  Result Value Ref Range Status   MRSA by PCR Next Gen NOT DETECTED NOT DETECTED Final    Comment: (NOTE) The GeneXpert MRSA Assay (FDA approved for NASAL specimens only), is one component of a comprehensive MRSA colonization surveillance program. It is not intended to diagnose MRSA infection nor to guide or monitor treatment for MRSA infections. Test performance is not FDA approved in patients less than 4 years old. Performed at [facility], [place] [address], [place], [place] [address]      Labs: BNP (last 3 results) Recent Labs    02/25/20 2045  BNP [id number]   Basic Metabolic Panel: Recent Labs  Lab 08/15/20 1520 08/15/20 1615 08/15/20 2023 08/16/20 0256 08/16/20 0328 08/17/20 0158  NA 138 142 139 138 139 133*  K 2.4* 2.6* 3.1* 3.9 4.0 4.7  CL  103  --  103 106  --  102  CO2 21*  --  24 25  --  23  GLUCOSE 195*  --  73 84  --  91  BUN 13  --  15 18  --  13  CREATININE 4.67*  --  5.04* 5.41*  --  4.84*  CALCIUM 7.9*  --  7.8* 7.6*  --  7.9*  MG  --   --  1.6*  --   --   --   PHOS  --   --  2.9  --   --   --    Liver Function Tests: No results for input(s): AST, ALT, ALKPHOS, BILITOT, PROT, ALBUMIN in the last 168 hours. No results for input(s): LIPASE, AMYLASE in the last 168 hours. No results for input(s): AMMONIA in the last 168 hours. CBC: Recent Labs  Lab 08/15/20 1520 08/15/20 1615 08/16/20 0256 08/16/20 0328 08/17/20 0158 08/18/20 0102  WBC 5.9  --  3.3*  --  5.8 7.9  NEUTROABS  --   --   --   --   --  6.0  HGB 10.4* 10.2* 8.5* 9.2* 9.3* 9.3*  HCT 34.5* 30.0* 26.3* 27.0* 29.6* 29.8*  MCV 96.1  --  90.1  --  92.5 92.5  PLT 113*  --  105*  --  103* 101*   Cardiac Enzymes: No results for input(s): CKTOTAL, CKMB, CKMBINDEX, TROPONINI in the last 168 hours. BNP: Invalid input(s): POCBNP CBG: Recent Labs  Lab 08/17/20 1701 08/17/20 1949 08/17/20 2331 08/18/20 0331 08/18/20 0728  GLUCAP 77 114* 94 91 78   D-Dimer No results for input(s): DDIMER in the last 72 hours. Hgb A1c Recent Labs    08/15/20 2023  HGBA1C 5.4   Lipid Profile Recent Labs    08/16/20 0256  TRIG 68   Thyroid function studies No results for input(s): TSH, T4TOTAL, T3FREE, THYROIDAB in the last 72 hours.  Invalid input(s): FREET3 Anemia work up No results for input(s): VITAMINB12, FOLATE, FERRITIN, TIBC, IRON, RETICCTPCT in the last 72 hours. Urinalysis    Component Value Date/Time   COLORURINE YELLOW 03/14/2020 0300   APPEARANCEUR CLOUDY (A) 03/14/2020 0300   LABSPEC 1.023 03/14/2020 0300   PHURINE 8.0 03/14/2020 0300  GLUCOSEU 50 (A) 03/14/2020 0300   HGBUR SMALL (A) 03/14/2020 0300   BILIRUBINUR NEGATIVE 03/14/2020 0300   KETONESUR 5 (A) 03/14/2020 0300   PROTEINUR >=300 (A) 03/14/2020 0300   NITRITE NEGATIVE  03/14/2020 0300   LEUKOCYTESUR NEGATIVE 03/14/2020 0300   Sepsis Labs Invalid input(s): PROCALCITONIN,  WBC,  LACTICIDVEN Microbiology Recent Results (from the past 240 hour(s))  Resp Panel by RT-PCR (Flu A&B, Covid) Nasopharyngeal Swab     Status: None   Collection Time: 08/15/20  3:22 PM   Specimen: Nasopharyngeal Swab; Nasopharyngeal(NP) swabs in vial transport medium  Result Value Ref Range Status   SARS Coronavirus 2 by RT PCR NEGATIVE NEGATIVE Final    Comment: (NOTE) SARS-CoV-2 target nucleic acids are NOT DETECTED.  The SARS-CoV-2 RNA is generally detectable in upper respiratory specimens during the acute phase of infection. The lowest concentration of SARS-CoV-2 viral copies this assay can detect is 138 copies/mL. A negative result does not preclude SARS-Cov-2 infection and should not be used as the sole basis for treatment or other patient management decisions. A negative result may occur with  improper specimen collection/handling, submission of specimen other than nasopharyngeal swab, presence of viral mutation(s) within the areas targeted by this assay, and inadequate number of viral copies(<138 copies/mL). A negative result must be combined with clinical observations, patient history, and epidemiological information. The expected result is Negative.  Fact Sheet for Patients:  EntrepreneurPulse.com.au  Fact Sheet for Healthcare Providers:  IncredibleEmployment.be  This test is no t yet approved or cleared by the [place] FDA and  has been authorized for detection and/or diagnosis of SARS-CoV-2 by FDA under an Emergency Use Authorization (EUA). This EUA will remain  in effect (meaning this test can be used) for the duration of the COVID-19 declaration under Section 564(b)(1) of the Act, 21 U.S.C.section 360bbb-3(b)(1), unless the authorization is terminated  or revoked sooner.       Influenza A by PCR NEGATIVE NEGATIVE  Final   Influenza B by PCR NEGATIVE NEGATIVE Final    Comment: (NOTE) The Xpert Xpress SARS-CoV-2/FLU/RSV plus assay is intended as an aid in the diagnosis of influenza from Nasopharyngeal swab specimens and should not be used as a sole basis for treatment. Nasal washings and aspirates are unacceptable for Xpert Xpress SARS-CoV-2/FLU/RSV testing.  Fact Sheet for Patients: EntrepreneurPulse.com.au  Fact Sheet for Healthcare Providers: IncredibleEmployment.be  This test is not yet approved or cleared by the [place] FDA and has been authorized for detection and/or diagnosis of SARS-CoV-2 by FDA under an Emergency Use Authorization (EUA). This EUA will remain in effect (meaning this test can be used) for the duration of the COVID-19 declaration under Section 564(b)(1) of the Act, 21 U.S.C. section 360bbb-3(b)(1), unless the authorization is terminated or revoked.  Performed at [facility], [place] [address] [place], [place] [address]   Culture, blood (routine x 2)     Status: None (Preliminary result)   Collection Time: 08/15/20  8:23 PM   Specimen: BLOOD  Result Value Ref Range Status   Specimen Description BLOOD LEFT HAND  Final   Special Requests   Final    BOTTLES DRAWN AEROBIC ONLY Blood Culture results may not be optimal due to an inadequate volume of blood received in culture bottles   Culture   Final    NO GROWTH 2 DAYS Performed at [facility], [place] [address], [place], [place] [address]    Report Status PENDING  Incomplete  Culture, blood (  routine x 2)     Status: None (Preliminary result)   Collection Time: 08/15/20  8:23 PM   Specimen: BLOOD  Result Value Ref Range Status   Specimen Description BLOOD RIGHT HAND  Final   Special Requests   Final    BOTTLES DRAWN AEROBIC ONLY Blood Culture results may not be optimal due to an inadequate volume of blood received in culture bottles   Culture   Final    NO GROWTH 2  DAYS Performed at [facility], [place] [address] [place], [place] [address]    Report Status PENDING  Incomplete  MRSA Next Gen by PCR, Nasal     Status: None   Collection Time: 08/15/20  9:49 PM   Specimen: Nasal Mucosa; Nasal Swab  Result Value Ref Range Status   MRSA by PCR Next Gen NOT DETECTED NOT DETECTED Final    Comment: (NOTE) The GeneXpert MRSA Assay (FDA approved for NASAL specimens only), is one component of a comprehensive MRSA colonization surveillance program. It is not intended to diagnose MRSA infection nor to guide or monitor treatment for MRSA infections. Test performance is not FDA approved in patients less than 73 years old. Performed at [facility], [place] [address] [place], [place] [address]      Time coordinating discharge: 35 minutes  SIGNED:   Aline August, MD  [facility] 08/18/2020, 10:10 AM

## 2020-08-18 NOTE — Progress Notes (Signed)
Patient given discharge instructions; AVS printed, reviewed, and given to pt; pt discharged via wheelchair to [facility] with her son; she is stable at the time of discharge.

## 2020-08-20 LAB — CULTURE, BLOOD (ROUTINE X 2)
Culture: NO GROWTH
Culture: NO GROWTH

## 2021-01-14 LAB — COLOGUARD: COLOGUARD: NEGATIVE

## 2021-07-03 ENCOUNTER — Emergency Department (HOSPITAL_COMMUNITY)
Admission: EM | Admit: 2021-07-03 | Discharge: 2021-07-04 | Disposition: A | Discharge status: Home / Self Care | Payer: BLUE CROSS/BLUE SHIELD | Attending: Emergency Medicine | Admitting: Emergency Medicine

## 2021-07-03 ENCOUNTER — Other Ambulatory Visit: Payer: Self-pay

## 2021-07-03 ENCOUNTER — Encounter (HOSPITAL_COMMUNITY): Payer: Self-pay | Admitting: Emergency Medicine

## 2021-07-03 DIAGNOSIS — I132 Hypertensive heart and chronic kidney disease with heart failure and with stage 5 chronic kidney disease, or end stage renal disease: Secondary | ICD-10-CM | POA: Insufficient documentation

## 2021-07-03 DIAGNOSIS — T82838A Hemorrhage of vascular prosthetic devices, implants and grafts, initial encounter: Secondary | ICD-10-CM | POA: Insufficient documentation

## 2021-07-03 DIAGNOSIS — Z992 Dependence on renal dialysis: Secondary | ICD-10-CM | POA: Insufficient documentation

## 2021-07-03 DIAGNOSIS — I509 Heart failure, unspecified: Secondary | ICD-10-CM | POA: Insufficient documentation

## 2021-07-03 DIAGNOSIS — N186 End stage renal disease: Secondary | ICD-10-CM | POA: Insufficient documentation

## 2021-07-03 DIAGNOSIS — Z7982 Long term (current) use of aspirin: Secondary | ICD-10-CM | POA: Insufficient documentation

## 2021-07-03 DIAGNOSIS — R55 Syncope and collapse: Secondary | ICD-10-CM | POA: Insufficient documentation

## 2021-07-03 DIAGNOSIS — D649 Anemia, unspecified: Secondary | ICD-10-CM | POA: Insufficient documentation

## 2021-07-03 DIAGNOSIS — Z79899 Other long term (current) drug therapy: Secondary | ICD-10-CM | POA: Insufficient documentation

## 2021-07-03 LAB — CBC WITH DIFFERENTIAL/PLATELET
Abs Immature Granulocytes: 0.02 10*3/uL (ref 0.00–0.07)
Basophils Absolute: 0.1 10*3/uL (ref 0.0–0.1)
Basophils Relative: 1 %
Eosinophils Absolute: 0.1 10*3/uL (ref 0.0–0.5)
Eosinophils Relative: 2 %
HCT: 27.1 % — ABNORMAL LOW (ref 36.0–46.0)
Hemoglobin: 8.5 g/dL — ABNORMAL LOW (ref 12.0–15.0)
Immature Granulocytes: 1 %
Lymphocytes Relative: 26 %
Lymphs Abs: 1.1 10*3/uL (ref 0.7–4.0)
MCH: 30.7 pg (ref 26.0–34.0)
MCHC: 31.4 g/dL (ref 30.0–36.0)
MCV: 97.8 fL (ref 80.0–100.0)
Monocytes Absolute: 0.9 10*3/uL (ref 0.1–1.0)
Monocytes Relative: 20 %
Neutro Abs: 2.2 10*3/uL (ref 1.7–7.7)
Neutrophils Relative %: 50 %
Platelets: 70 10*3/uL — ABNORMAL LOW (ref 150–400)
RBC: 2.77 MIL/uL — ABNORMAL LOW (ref 3.87–5.11)
RDW: 14.3 % (ref 11.5–15.5)
WBC: 4.4 10*3/uL (ref 4.0–10.5)
nRBC: 0 % (ref 0.0–0.2)

## 2021-07-03 NOTE — ED Triage Notes (Signed)
Patient from home, was asleep on the couch, woke up in pool of blood.  She had dialysis this morning, using the new fistula in the left arm.  Blood soaked thru to bottom of cushion of couch and pooling under the cushion.  Patient is CAOx4, not tachy and not hypotensive at this time.  122/72, 92 HR.  EMS held direct pressure for about 5 mins and bleeding was stopped at this time.

## 2021-07-03 NOTE — ED Provider Notes (Addendum)
[facility] EMERGENCY DEPARTMENT Provider Note   CSN: [id number] Arrival date & time: 07/03/21  2038     History  Chief Complaint  Patient presents with   Vascular Access Problem    Barbara Crawford is a 66 y.o. female.  Patient with history of mixed heart failure, end-stage renal disease on hemodialysis, hypertension --presents to the emergency department for evaluation of bleeding from her left upper extremity graft.  Patient had dialysis today.  Session was unremarkable.  Returned home and took a nap.  She woke up with a large amount of bleeding from the graft, soaking the cushion of a couch.  EMS was called.  Patient family member attempted to hold pressure which was not successful.  Patient did have a syncopal spell during this time.  She reports prodrome of feeling very flushed and then woke up with family member calling her name.  EMS arrived and held direct pressure which stopped bleeding.  Patient is now feeling well and back to her baseline.  No lightheadedness or dizziness.  No associated chest pain or shortness of breath.       Home Medications Prior to Admission medications   Medication Sig Start Date End Date Taking? Authorizing Provider  acetaminophen (TYLENOL) 500 MG tablet Take 500 mg by mouth every 6 (six) hours as needed for mild pain or moderate pain.    [provider]  aspirin 81 MG chewable tablet Chew 81 mg by mouth daily.    [provider]  ferric citrate (AURYXIA) 1 GM 210 MG(Fe) tablet Take 210 mg by mouth 3 (three) times daily with meals.    [provider]  metoprolol tartrate (LOPRESSOR) 25 MG tablet Take 1 tablet (25 mg total) by mouth 2 (two) times daily. 08/18/20   Aline August, MD  NIFEdipine (ADALAT CC) 60 MG 24 hr tablet Take 60 mg by mouth daily. 07/23/20   [provider]      Allergies    Shellfish-derived products, Erythromycin, Penicillins, Chlorhexidine, and Iodine    Review of  Systems   Review of Systems  Physical Exam Updated Vital Signs BP 121/64   Pulse 83   Resp (!) 31   SpO2 100%   Physical Exam Vitals and nursing note reviewed.  Constitutional:      General: She is not in acute distress.    Appearance: She is well-developed.  HENT:     Head: Normocephalic and atraumatic.     Right Ear: External ear normal.     Left Ear: External ear normal.     Nose: Nose normal.  Eyes:     Conjunctiva/sclera: Conjunctivae normal.  Cardiovascular:     Rate and Rhythm: Normal rate and regular rhythm.     Heart sounds: No murmur heard. Pulmonary:     Effort: No respiratory distress.     Breath sounds: No wheezing, rhonchi or rales.  Abdominal:     Palpations: Abdomen is soft.     Tenderness: There is no abdominal tenderness. There is no guarding or rebound.  Musculoskeletal:     Cervical back: Normal range of motion and neck supple.     Right lower leg: No edema.     Left lower leg: No edema.     Comments: Bandage over left upper extremity graft.  No active bleeding, palpable thrill.  Skin:    General: Skin is warm and dry.     Findings: No rash.  Neurological:     General:  No focal deficit present.     Mental Status: She is alert. Mental status is at baseline.     Motor: No weakness.  Psychiatric:        Mood and Affect: Mood normal.     ED Results / Procedures / Treatments   Labs (all labs ordered are listed, but only abnormal results are displayed) Labs Reviewed  CBC WITH DIFFERENTIAL/PLATELET - Abnormal; Notable for the following components:      Result Value   RBC 2.77 (*)    Hemoglobin 8.5 (*)    HCT 27.1 (*)    Platelets 70 (*)    All other components within normal limits  BASIC METABOLIC PANEL    ED ECG REPORT   Date: 07/03/2021  Rate: 79  Rhythm: normal sinus rhythm  QRS Axis: left  Intervals: normal  ST/T Wave abnormalities: nonspecific T wave changes  Conduction Disutrbances:left anterior fascicular block  Narrative  Interpretation:   Old EKG Reviewed: changes noted, slower today  I have personally reviewed the EKG tracing and agree with the computerized printout as noted.   Radiology No results found.  Procedures Procedures    Medications Ordered in ED Medications - No data to display  ED Course/ Medical Decision Making/ A&P Clinical Course as of 07/03/21 2330  Sat Jul 03, 2021  2327 Repeat CBC at midnight, ok to go after [MK]    Clinical Course User Index [MK] Kommor, Debe Coder, MD   Patient seen and examined. History obtained directly from patient and family at bedside. Reviewed labs from external source.   Labs/EKG: Ordered CBC, EKG.  Medications/Fluids: None ordered.   Most recent vital signs reviewed and are as follows: BP 122/68   Pulse 79   Temp 98.2 F (36.8 C) (Oral)   Resp (!) 23   SpO2 100%   Initial impression: Acute blood loss from dialysis graft, now controlled.  10:51 PM Reassessment performed. Patient appears comfortable, resting.  Labs personally reviewed and interpreted including: CBC with white blood cell count 5.4, hemoglobin 8.5, down from 10.7 per [facility] records 2 days ago.  Thrombocytopenia noted platelets 70.  Reviewed pertinent lab work and imaging with patient at bedside. Questions answered.   Most current vital signs reviewed and are as follows: BP 112/68   Pulse 78   Temp 98.2 F (36.8 C) (Oral)   Resp 15   SpO2 100%   Plan: We will recheck hemoglobin and hematocrit around midnight to ensure stability of hemoglobin.  Patient agreeable.  11:31 PM Signout to Dr. Matilde Sprang and Ileene Patrick PA-C at shift change.                            Medical Decision Making Amount and/or Complexity of Data Reviewed Labs: ordered.   Patient with bleeding dialysis graft, now controlled, however hemoglobin is down from 2 days ago.    Final Clinical Impression(s) / ED Diagnoses Final diagnoses:  Hemorrhage from arteriovenous dialysis graft [facility])    Rx  / DC Orders ED Discharge Orders     None         Carlisle Cater, PA-C 07/03/21 2301    Carlisle Cater, PA-C 07/03/21 2331    Kommor, [place], MD 07/04/21 [phone]

## 2021-07-03 NOTE — Discharge Instructions (Signed)
Return with additional bleeding, if you become severely lightheaded, short of breath, or pass out again.   Your hemoglobin was lower after the bleeding tonight. Please let your providers know at your next dialysis appointment on Tuesday so they can recheck your levels.

## 2021-07-03 NOTE — ED Notes (Signed)
Patient states that she did not want an IV, therefore, blood obtained through butterfly needle. CMP order was placed after stick was finished. Patient stated that she doesn't want to be stuck anymore. PA Broadus John made aware. CBC obtained, CMP not.

## 2021-07-04 LAB — BASIC METABOLIC PANEL
Anion gap: 10 (ref 5–15)
BUN: 17 mg/dL (ref 8–23)
CO2: 26 mmol/L (ref 22–32)
Calcium: 7.8 mg/dL — ABNORMAL LOW (ref 8.9–10.3)
Chloride: 103 mmol/L (ref 98–111)
Creatinine, Ser: 4.39 mg/dL — ABNORMAL HIGH (ref 0.44–1.00)
GFR, Estimated: 11 mL/min — ABNORMAL LOW (ref >60)
Glucose, Bld: 78 mg/dL (ref 70–99)
Potassium: 3.7 mmol/L (ref 3.5–5.1)
Sodium: 139 mmol/L (ref 135–145)

## 2021-07-04 LAB — CBC
HCT: 26.7 % — ABNORMAL LOW (ref 36.0–46.0)
Hemoglobin: 8.3 g/dL — ABNORMAL LOW (ref 12.0–15.0)
MCH: 30.3 pg (ref 26.0–34.0)
MCHC: 31.1 g/dL (ref 30.0–36.0)
MCV: 97.4 fL (ref 80.0–100.0)
Platelets: 67 K/uL — ABNORMAL LOW (ref 150–400)
RBC: 2.74 MIL/uL — ABNORMAL LOW (ref 3.87–5.11)
RDW: 14.6 % (ref 11.5–15.5)
WBC: 4 K/uL (ref 4.0–10.5)
nRBC: 0 % (ref 0.0–0.2)

## 2021-10-05 ENCOUNTER — Emergency Department (HOSPITAL_COMMUNITY): Payer: Medicare Other

## 2021-10-05 ENCOUNTER — Encounter (HOSPITAL_COMMUNITY): Payer: Self-pay

## 2021-10-05 ENCOUNTER — Other Ambulatory Visit: Payer: Self-pay

## 2021-10-05 ENCOUNTER — Inpatient Hospital Stay (HOSPITAL_COMMUNITY)
Admission: EM | Admit: 2021-10-05 | Discharge: 2021-10-08 | DRG: 286 | Disposition: A | Discharge status: Home / Self Care | Payer: Medicare Other | Attending: Family Medicine | Admitting: Family Medicine

## 2021-10-05 DIAGNOSIS — R0602 Shortness of breath: Principal | ICD-10-CM

## 2021-10-05 DIAGNOSIS — Z880 Allergy status to penicillin: Secondary | ICD-10-CM

## 2021-10-05 DIAGNOSIS — F419 Anxiety disorder, unspecified: Secondary | ICD-10-CM

## 2021-10-05 DIAGNOSIS — I2489 Other forms of acute ischemic heart disease: Secondary | ICD-10-CM | POA: Diagnosis present

## 2021-10-05 DIAGNOSIS — D631 Anemia in chronic kidney disease: Secondary | ICD-10-CM | POA: Diagnosis present

## 2021-10-05 DIAGNOSIS — Z91041 Radiographic dye allergy status: Secondary | ICD-10-CM

## 2021-10-05 DIAGNOSIS — Z91013 Allergy to seafood: Secondary | ICD-10-CM

## 2021-10-05 DIAGNOSIS — E8770 Fluid overload, unspecified: Secondary | ICD-10-CM | POA: Diagnosis present

## 2021-10-05 DIAGNOSIS — I10 Essential (primary) hypertension: Secondary | ICD-10-CM | POA: Diagnosis present

## 2021-10-05 DIAGNOSIS — Z7982 Long term (current) use of aspirin: Secondary | ICD-10-CM

## 2021-10-05 DIAGNOSIS — J811 Chronic pulmonary edema: Secondary | ICD-10-CM | POA: Diagnosis present

## 2021-10-05 DIAGNOSIS — I509 Heart failure, unspecified: Secondary | ICD-10-CM

## 2021-10-05 DIAGNOSIS — R000 Tachycardia, unspecified: Secondary | ICD-10-CM | POA: Diagnosis present

## 2021-10-05 DIAGNOSIS — I5021 Acute systolic (congestive) heart failure: Secondary | ICD-10-CM

## 2021-10-05 DIAGNOSIS — Z992 Dependence on renal dialysis: Secondary | ICD-10-CM

## 2021-10-05 DIAGNOSIS — I444 Left anterior fascicular block: Secondary | ICD-10-CM | POA: Diagnosis present

## 2021-10-05 DIAGNOSIS — Z1152 Encounter for screening for COVID-19: Secondary | ICD-10-CM

## 2021-10-05 DIAGNOSIS — R0902 Hypoxemia: Secondary | ICD-10-CM | POA: Diagnosis present

## 2021-10-05 DIAGNOSIS — I428 Other cardiomyopathies: Secondary | ICD-10-CM | POA: Diagnosis present

## 2021-10-05 DIAGNOSIS — I5023 Acute on chronic systolic (congestive) heart failure: Secondary | ICD-10-CM | POA: Diagnosis present

## 2021-10-05 DIAGNOSIS — I132 Hypertensive heart and chronic kidney disease with heart failure and with stage 5 chronic kidney disease, or end stage renal disease: Principal | ICD-10-CM | POA: Diagnosis present

## 2021-10-05 DIAGNOSIS — N186 End stage renal disease: Secondary | ICD-10-CM

## 2021-10-05 DIAGNOSIS — Z79899 Other long term (current) drug therapy: Secondary | ICD-10-CM

## 2021-10-05 DIAGNOSIS — Z881 Allergy status to other antibiotic agents status: Secondary | ICD-10-CM

## 2021-10-05 DIAGNOSIS — Z91030 Bee allergy status: Secondary | ICD-10-CM

## 2021-10-05 DIAGNOSIS — J9601 Acute respiratory failure with hypoxia: Secondary | ICD-10-CM

## 2021-10-05 HISTORY — DX: Anxiety disorder, unspecified: F41.9

## 2021-10-05 HISTORY — DX: End stage renal disease: N18.6

## 2021-10-05 HISTORY — DX: Unspecified systolic (congestive) heart failure: I50.20

## 2021-10-05 HISTORY — DX: Dependence on renal dialysis: Z99.2

## 2021-10-05 HISTORY — DX: Chronic systolic (congestive) heart failure: I50.22

## 2021-10-05 LAB — CBC WITH DIFFERENTIAL/PLATELET
Abs Immature Granulocytes: 0.02 10*3/uL (ref 0.00–0.07)
Basophils Absolute: 0.1 10*3/uL (ref 0.0–0.1)
Basophils Relative: 2 %
Eosinophils Absolute: 0.1 10*3/uL (ref 0.0–0.5)
Eosinophils Relative: 2 %
HCT: 42.7 % (ref 36.0–46.0)
Hemoglobin: 12.8 g/dL (ref 12.0–15.0)
Immature Granulocytes: 0 %
Lymphocytes Relative: 32 %
Lymphs Abs: 2.2 10*3/uL (ref 0.7–4.0)
MCH: 30.1 pg (ref 26.0–34.0)
MCHC: 30 g/dL (ref 30.0–36.0)
MCV: 100.5 fL — ABNORMAL HIGH (ref 80.0–100.0)
Monocytes Absolute: 0.5 10*3/uL (ref 0.1–1.0)
Monocytes Relative: 7 %
Neutro Abs: 3.9 10*3/uL (ref 1.7–7.7)
Neutrophils Relative %: 57 %
Platelets: 158 10*3/uL (ref 150–400)
RBC: 4.25 MIL/uL (ref 3.87–5.11)
RDW: 15.9 % — ABNORMAL HIGH (ref 11.5–15.5)
WBC: 6.9 10*3/uL (ref 4.0–10.5)
nRBC: 0 % (ref 0.0–0.2)

## 2021-10-05 LAB — RESP PANEL BY RT-PCR (FLU A&B, COVID) ARPGX2
Influenza A by PCR: NEGATIVE
Influenza B by PCR: NEGATIVE
SARS Coronavirus 2 by RT PCR: NEGATIVE

## 2021-10-05 LAB — I-STAT VENOUS BLOOD GAS, ED
Acid-base deficit: 3 mmol/L — ABNORMAL HIGH (ref 0.0–2.0)
Bicarbonate: 26.1 mmol/L (ref 20.0–28.0)
Calcium, Ion: 1.04 mmol/L — ABNORMAL LOW (ref 1.15–1.40)
HCT: 43 % (ref 36.0–46.0)
Hemoglobin: 14.6 g/dL (ref 12.0–15.0)
O2 Saturation: 82 %
Potassium: 3.7 mmol/L (ref 3.5–5.1)
Sodium: 143 mmol/L (ref 135–145)
TCO2: 28 mmol/L (ref 22–32)
pCO2, Ven: 61.9 mmHg — ABNORMAL HIGH (ref 44–60)
pH, Ven: 7.234 — ABNORMAL LOW (ref 7.25–7.43)
pO2, Ven: 56 mmHg — ABNORMAL HIGH (ref 32–45)

## 2021-10-05 LAB — TROPONIN I (HIGH SENSITIVITY)
Troponin I (High Sensitivity): 64 ng/L — ABNORMAL HIGH (ref <18)
Troponin I (High Sensitivity): 77 ng/L — ABNORMAL HIGH (ref <18)

## 2021-10-05 LAB — COMPREHENSIVE METABOLIC PANEL
ALT: 24 U/L (ref 0–44)
AST: 34 U/L (ref 15–41)
Albumin: 3 g/dL — ABNORMAL LOW (ref 3.5–5.0)
Alkaline Phosphatase: 72 U/L (ref 38–126)
Anion gap: 18 — ABNORMAL HIGH (ref 5–15)
BUN: 55 mg/dL — ABNORMAL HIGH (ref 8–23)
CO2: 22 mmol/L (ref 22–32)
Calcium: 8.8 mg/dL — ABNORMAL LOW (ref 8.9–10.3)
Chloride: 103 mmol/L (ref 98–111)
Creatinine, Ser: 9.25 mg/dL — ABNORMAL HIGH (ref 0.44–1.00)
GFR, Estimated: 4 mL/min — ABNORMAL LOW (ref >60)
Glucose, Bld: 172 mg/dL — ABNORMAL HIGH (ref 70–99)
Potassium: 4.2 mmol/L (ref 3.5–5.1)
Sodium: 143 mmol/L (ref 135–145)
Total Bilirubin: 0.6 mg/dL (ref 0.3–1.2)
Total Protein: 8.1 g/dL (ref 6.5–8.1)

## 2021-10-05 LAB — BRAIN NATRIURETIC PEPTIDE: B Natriuretic Peptide: 2257.2 pg/mL — ABNORMAL HIGH (ref 0.0–100.0)

## 2021-10-05 LAB — HEPATITIS B SURFACE ANTIBODY,QUALITATIVE: Hep B S Ab: NONREACTIVE

## 2021-10-05 LAB — HEPATITIS B CORE ANTIBODY, TOTAL: Hep B Core Total Ab: NONREACTIVE

## 2021-10-05 LAB — HEPATITIS B SURFACE ANTIGEN: Hepatitis B Surface Ag: NONREACTIVE

## 2021-10-05 LAB — HEPATITIS C ANTIBODY: HCV Ab: NONREACTIVE

## 2021-10-05 LAB — MAGNESIUM: Magnesium: 2.2 mg/dL (ref 1.7–2.4)

## 2021-10-05 MED ORDER — LORAZEPAM 2 MG/ML IJ SOLN
0.5000 mg | Freq: Once | INTRAMUSCULAR | Status: AC | PRN
  Administered 2021-10-05: 0.5 mg via INTRAVENOUS
  Filled 2021-10-05: qty 1

## 2021-10-05 MED ORDER — HEPARIN SODIUM (PORCINE) 1000 UNIT/ML DIALYSIS
2000.0000 [IU] | INTRAMUSCULAR | Status: DC | PRN
  Filled 2021-10-05: qty 2

## 2021-10-05 MED ORDER — LORAZEPAM 2 MG/ML IJ SOLN
0.5000 mg | Freq: Once | INTRAMUSCULAR | Status: AC
  Administered 2021-10-05: 0.5 mg via INTRAVENOUS
  Filled 2021-10-05: qty 1

## 2021-10-05 MED ORDER — NITROGLYCERIN 0.4 MG SL SUBL: 0.4000 mg | SUBLINGUAL_TABLET | SUBLINGUAL | Status: DC | PRN

## 2021-10-05 MED ORDER — NITROGLYCERIN 0.4 MG SL SUBL
0.4000 mg | SUBLINGUAL_TABLET | Freq: Once | SUBLINGUAL | Status: AC
  Administered 2021-10-05: 0.4 mg via SUBLINGUAL
  Filled 2021-10-05: qty 1

## 2021-10-05 NOTE — ED Notes (Signed)
Family would like to be notified when pt comes back down to ED

## 2021-10-05 NOTE — Progress Notes (Signed)
Bipap patient transported from H/D to ED33 without any complications.

## 2021-10-05 NOTE — Progress Notes (Signed)
Called by ED to see patient for resp distress, improved on bipap this am.  Pt seen in ED, she is on HD at [facility], her dry wt is 53kg, she comes to all her HD sessions and stays on the machine. On exam has sig bilat pretib edema and of course CXR showed pulm edema. On bipap her distress has resolved. Will plan max UF and hopefully she can stand to weigh after HD. Suspect she has lost some body wt and dry wt will need to be lowered. Have d/w [facility] staff as well. Pt will be moved back to ED post HD for re-assessment by ED team.     OP HD: TTS [facility] 53kg   LUA AVG   No heparin  350/600   Kelly Splinter, MD 10/05/2021, 2:43 PM

## 2021-10-05 NOTE — Progress Notes (Signed)
Patient placed on bipap per MD order.  Patient is tolerating well at this time.

## 2021-10-05 NOTE — Procedures (Signed)
   I was present at this dialysis session, have reviewed the session itself and made  appropriate changes Rob Addalynn Kumari MD [facility] pager [phone]   10/05/2021, 12:25 PM   

## 2021-10-05 NOTE — ED Notes (Signed)
Consent signed and at the bedside  

## 2021-10-05 NOTE — ED Notes (Signed)
Pt requested a break from BIPAP. This Pt remains 100% on room air, reports improved SOB after dialysis. RT at bedside and is okay with pt staying off. Tegeler MD to be made aware

## 2021-10-05 NOTE — ED Triage Notes (Signed)
Patient bib [facility] from home with complaints of sudden onset of SOB that woke her up out of her sleep and is diaphoretic. Patient lost control of bowels. Per EMS patient was sating in the lower 80s when they arrived. Patient placed on nasal cannula at 6 liters. Patient is on dialysis tues thurs sat, hasnt missed any and complete her diaylsis on sat. BG 137.

## 2021-10-05 NOTE — Progress Notes (Signed)
Patient transported from ED Room 33 to Dialysis on BIPAP with no complications noted.

## 2021-10-05 NOTE — ED Provider Notes (Signed)
[place] EMERGENCY DEPARTMENT Provider Note   CSN: [id number] Arrival date & time: 10/05/21  0725     History  Chief Complaint  Patient presents with   Shortness of Breath    Barbara Crawford is a 66 y.o. female with a past medical history significant for hypertension, ESRD on dialysis Tuesday/Thursday/Saturday with last full session on Saturday, history of GI bleed who presents to the ED due to sudden onset of shortness of breath.  Patient states she was in her normal state of health yesterday and woke up early this morning with sudden shortness of breath.  Admits to a mild cough.  No fever or chills.  Denies associated chest pain.  Patient was found to be hypoxic upon EMS arrival in the lower 80s.  Patient placed on 6 L nasal cannula.  Patient is not on chronic oxygen. No history of CHF or COPD. Denies history of blood clots.   History obtained from patient and past medical records. No interpreter used during encounter.       Home Medications Prior to Admission medications   Medication Sig Start Date End Date Taking? Authorizing Provider  acetaminophen (TYLENOL) 500 MG tablet Take 500 mg by mouth every 6 (six) hours as needed for mild pain or moderate pain.   Yes [provider]  aspirin 81 MG chewable tablet Chew 81 mg by mouth daily.   Yes [provider]  ferric citrate (AURYXIA) 1 GM 210 MG(Fe) tablet Take 210 mg by mouth 3 (three) times daily with meals.   Yes [provider]  ferrous sulfate 325 (65 FE) MG tablet Take 325 mg by mouth daily with breakfast.   Yes [provider]  metoprolol tartrate (LOPRESSOR) 25 MG tablet Take 1 tablet (25 mg total) by mouth 2 (two) times daily. 08/18/20  Yes Aline August, MD  Multiple Vitamin (MULTIVITAMIN WITH MINERALS) TABS tablet Take 1 tablet by mouth daily.   Yes [provider]  NIFEdipine (ADALAT CC) 60 MG 24 hr tablet Take 60 mg by mouth daily. Patient not taking:  Reported on 10/05/2021 07/23/20   [provider]      Allergies    Bee venom, Shellfish-derived products, Erythromycin, Penicillins, Chlorhexidine, and Iodine    Review of Systems   Review of Systems  Constitutional:  Negative for chills and fever.  Respiratory:  Positive for cough and shortness of breath.   Cardiovascular:  Negative for chest pain and leg swelling.  Gastrointestinal:  Negative for abdominal pain, diarrhea, nausea and vomiting.  Genitourinary:  Negative for dysuria.  Musculoskeletal:  Negative for back pain.  All other systems reviewed and are negative.   Physical Exam Updated Vital Signs BP (!) 158/96   Pulse 98   Temp 98.2 F (36.8 C)   Resp (!) 28   SpO2 100%  Physical Exam Vitals and nursing note reviewed.  Constitutional:      General: She is not in acute distress.    Appearance: She is ill-appearing.  HENT:     Head: Normocephalic.  Eyes:     Pupils: Pupils are equal, round, and reactive to light.  Cardiovascular:     Rate and Rhythm: Normal rate and regular rhythm.     Pulses: Normal pulses.     Heart sounds: Normal heart sounds. No murmur heard.    No friction rub. No gallop.  Pulmonary:     Effort: Respiratory distress present.     Breath sounds: Wheezing and rhonchi  present.  Abdominal:     General: Abdomen is flat. There is no distension.     Palpations: Abdomen is soft.     Tenderness: There is no abdominal tenderness. There is no guarding or rebound.  Musculoskeletal:        General: Normal range of motion.     Cervical back: Neck supple.     Comments: Trace edema to lower extremities  Skin:    General: Skin is warm and dry.  Neurological:     General: No focal deficit present.     Mental Status: She is alert.  Psychiatric:        Mood and Affect: Mood normal.        Behavior: Behavior normal.     ED Results / Procedures / Treatments   Labs (all labs ordered are listed, but only abnormal results are displayed) Labs  Reviewed  CBC WITH DIFFERENTIAL/PLATELET - Abnormal; Notable for the following components:      Result Value   MCV 100.5 (*)    RDW 15.9 (*)    All other components within normal limits  BRAIN NATRIURETIC PEPTIDE - Abnormal; Notable for the following components:   B Natriuretic Peptide 2,257.2 (*)    All other components within normal limits  COMPREHENSIVE METABOLIC PANEL - Abnormal; Notable for the following components:   Glucose, Bld 172 (*)    BUN 55 (*)    Creatinine, Ser 9.25 (*)    Calcium 8.8 (*)    Albumin 3.0 (*)    GFR, Estimated 4 (*)    Anion gap 18 (*)    All other components within normal limits  I-STAT VENOUS BLOOD GAS, ED - Abnormal; Notable for the following components:   pH, Ven 7.234 (*)    pCO2, Ven 61.9 (*)    pO2, Ven 56 (*)    Acid-base deficit 3.0 (*)    Calcium, Ion 1.04 (*)    All other components within normal limits  TROPONIN I (HIGH SENSITIVITY) - Abnormal; Notable for the following components:   Troponin I (High Sensitivity) 64 (*)    All other components within normal limits  TROPONIN I (HIGH SENSITIVITY) - Abnormal; Notable for the following components:   Troponin I (High Sensitivity) 77 (*)    All other components within normal limits  RESP PANEL BY RT-PCR (FLU A&B, COVID) ARPGX2  MAGNESIUM  HEPATITIS B SURFACE ANTIGEN  HEPATITIS B SURFACE ANTIBODY,QUALITATIVE  HEPATITIS B SURFACE ANTIBODY, QUANTITATIVE  HEPATITIS B CORE ANTIBODY, TOTAL  HEPATITIS C ANTIBODY    EKG EKG Interpretation  Date/Time:  Tuesday October 05 2021 07:39:16 EDT Ventricular Rate:  129 PR Interval:  132 QRS Duration: 92 QT Interval:  328 QTC Calculation: 481 R Axis:   -70 Text Interpretation: Sinus tachycardia Probable left atrial enlargement Left anterior fascicular block Left ventricular hypertrophy Anterior infarct, old Nonspecific T abnormalities, lateral leads Baseline wander in lead(s) V2 Confirmed by Cindee Lame [phone]) on 10/05/2021 7:48:33  AM  Radiology DG Chest Port 1 View  Result Date: 10/05/2021 CLINICAL DATA:  Shortness of breath EXAM: PORTABLE CHEST 1 VIEW COMPARISON:  Previous studies including the examination of 08/16/2020 FINDINGS: Transverse diameter of heart is increased. Central pulmonary vessels are prominent. Diffuse alveolar densities are seen in both lungs, more so on the right side suggesting pulmonary edema. Possibility of underlying pneumonia is not excluded. Small left pleural effusion is seen. There is no pneumothorax. IMPRESSION: Cardiomegaly.  Pulmonary edema.  Small left pleural effusion. Electronically Signed  By: Elmer Picker M.D.   On: 10/05/2021 07:59    Procedures .Critical Care  Performed by: Suzy Bouchard, PA-C Authorized by: Suzy Bouchard, PA-C   Critical care provider statement:    Critical care time (minutes):  30   Critical care was necessary to treat or prevent imminent or life-threatening deterioration of the following conditions:  Respiratory failure   Critical care was time spent personally by me on the following activities:  Development of treatment plan with patient or surrogate, discussions with consultants, evaluation of patient's response to treatment, examination of patient, ordering and review of laboratory studies, ordering and review of radiographic studies, ordering and performing treatments and interventions, pulse oximetry, re-evaluation of patient's condition and review of old charts   I assumed direction of critical care for this patient from another provider in my specialty: no       Medications Ordered in ED Medications  heparin injection 2,000 Units (has no administration in time range)  LORazepam (ATIVAN) injection 0.5 mg (0.5 mg Intravenous Given 10/05/21 0754)  nitroGLYCERIN (NITROSTAT) SL tablet 0.4 mg (0.4 mg Sublingual Given 10/05/21 0754)  LORazepam (ATIVAN) injection 0.5 mg (0.5 mg Intravenous Given 10/05/21 1029)    ED Course/ Medical Decision  Making/ A&P Clinical Course as of 10/05/21 [place] Oct 05, 2021  0748 Called to RT for patient to be started on [place] Pt reevaluated. BP 150s after SL nitro. Pt on BiPAP and clinically improving, able to speak in full sentences and states she is breathing much easier. Patient needs dialysis will consult to nephrology. [HN]  0856 Creatinine(!): 9.25 [CA]  0857 BUN(!): 55 [CA]  0857 Glucose(!): 172 [CA]  0857 B Natriuretic Peptide(!): 2,257.2 [CA]  0857 Troponin I (High Sensitivity)(!): 64 [CA]  0857 pH, Ven(!): 7.234 [CA]  0858 Potassium: 4.2 [CA]  0932 Discussed with Dr. Jonnie Finner with nephrology. Patient will have dialysis today and then be brought back to the ED for reassessment for discharge vs. Admission. [CA]  1023 BP(!): 146/92 [CA]  1024 BP(!): 164/94 [CA]    Clinical Course User Index [CA] Suzy Bouchard, PA-C [HN] Audley Hose, MD                           Medical Decision Making Amount and/or Complexity of Data Reviewed Independent Historian: EMS External Data Reviewed: notes. Labs: ordered. Decision-making details documented in ED Course. Radiology: ordered and independent interpretation performed. Decision-making details documented in ED Course. ECG/medicine tests: ordered and independent interpretation performed. Decision-making details documented in ED Course.  Risk Prescription drug management.  This patient presents to the ED for concern of SOB, this involves an extensive number of treatment options, and is a complaint that carries with it a high risk of complications and morbidity.  The differential diagnosis includes pulmonary edema, COPD, PE, ACS, pneumonia, etc  66 year old female presents to the ED due to sudden onset of shortness of breath that started early this morning.  Patient has a history of ESRD on dialysis Tuesday, Thursday, and Saturday.  Last dialysis session on Saturday.  No associated chest pain.  Denies history of CHF and COPD.   Upon arrival, patient on 6 L nasal cannula, tachycardic at 132 and tachypneic. Afebrile. Patient hypertensive at 207/124. RT called to place patient on Bipap. Nitroglycerin given. Suspect patient is fluid overloaded from ESRD. No history of CHF. Routine labs ordered. CXR to rule out pneumonia. COVID/flu to rule  out infection. Low suspicion for PE/DVT.   8:11 AM reassessed patient at bedside.  Patient appears more comfortable.  Respiratory therapist at bedside about to start BiPAP.  Patient states improvement in shortness of breath after Ativan and nitroglycerin.  Will continue to monitor. BP improved to 170s.   CBC reassuring.  No leukocytosis and normal hemoglobin.  CMP significant for elevated creatinine at 9.25 and BUN at 55 secondary to ESRD.  Troponin elevated at 64 and BNP elevated likely due to ESRD.  Will obtain delta troponin to rule out ACS.  COVID/influenza negative.  Chest x-ray personally reviewed and interpreted which demonstrates cardiomegaly and pulmonary edema.  Small left pleural effusion.  Agree with radiology interpretation.  9:09 AM reassessed patient at bedside.  BP improved to 150s.  Patient admits to significant improvement in shortness of breath after nitroglycerin and Ativan.  Patient currently on BiPAP.  Will discuss with nephrology for emergent dialysis.  Patient brought up to dialysis and will need to be reassessed following treatment to determine disposition.   Discussed with Dr. Mayra Neer who evaluated patient at bedside and agrees with assessment and plan.         Final Clinical Impression(s) / ED Diagnoses Final diagnoses:  SOB (shortness of breath)  ESRD (end stage renal disease) [facility])    Rx / DC Orders ED Discharge Orders     None         Suzy Bouchard, PA-C 10/05/21 1502    Audley Hose, MD 10/05/21 2156

## 2021-10-05 NOTE — ED Provider Notes (Signed)
Patient returned from dialysis on BiPAP.  We are able to get her off the BiPAP and she is still having tachypnea in the 30s and 40s intermittently and tachycardia.  She still feels that she is short of breath and does not feel safe being at home at this time.  Although she is not requiring oxygen, due to her continued difficulty breathing, will call for admission for further management and monitoring and make sure she does not need further dialysis or intervention tomorrow.  Patient reassessed and she reports she is still having severe shortness of breath and is tachycardic and tachypneic.  She is off of BiPAP but does not feel safe being at home.  Called medicine and they will see her and admit for further management and making sure she does not need more dialysis tomorrow.   Clinical Impression: 1. SOB (shortness of breath)   2. ESRD (end stage renal disease) ([place])     Disposition: Admit  This note was prepared with assistance of Systems analyst. Occasional wrong-word or sound-a-like substitutions may have occurred due to the inherent limitations of voice recognition software.     Mckynna Vanloan, Gwenyth Allegra, MD 10/06/21 [phone]

## 2021-10-06 ENCOUNTER — Observation Stay (HOSPITAL_COMMUNITY): Payer: Medicare Other

## 2021-10-06 ENCOUNTER — Encounter (HOSPITAL_COMMUNITY): Payer: Self-pay | Admitting: Family Medicine

## 2021-10-06 DIAGNOSIS — Z7982 Long term (current) use of aspirin: Secondary | ICD-10-CM | POA: Diagnosis not present

## 2021-10-06 DIAGNOSIS — J811 Chronic pulmonary edema: Secondary | ICD-10-CM | POA: Diagnosis present

## 2021-10-06 DIAGNOSIS — Z79899 Other long term (current) drug therapy: Secondary | ICD-10-CM | POA: Diagnosis not present

## 2021-10-06 DIAGNOSIS — J9601 Acute respiratory failure with hypoxia: Secondary | ICD-10-CM | POA: Diagnosis not present

## 2021-10-06 DIAGNOSIS — I5043 Acute on chronic combined systolic (congestive) and diastolic (congestive) heart failure: Secondary | ICD-10-CM | POA: Diagnosis not present

## 2021-10-06 DIAGNOSIS — N186 End stage renal disease: Secondary | ICD-10-CM

## 2021-10-06 DIAGNOSIS — R000 Tachycardia, unspecified: Secondary | ICD-10-CM | POA: Diagnosis present

## 2021-10-06 DIAGNOSIS — I509 Heart failure, unspecified: Secondary | ICD-10-CM

## 2021-10-06 DIAGNOSIS — Z91041 Radiographic dye allergy status: Secondary | ICD-10-CM | POA: Diagnosis not present

## 2021-10-06 DIAGNOSIS — I2510 Atherosclerotic heart disease of native coronary artery without angina pectoris: Secondary | ICD-10-CM | POA: Diagnosis not present

## 2021-10-06 DIAGNOSIS — J810 Acute pulmonary edema: Secondary | ICD-10-CM

## 2021-10-06 DIAGNOSIS — E8770 Fluid overload, unspecified: Secondary | ICD-10-CM | POA: Diagnosis present

## 2021-10-06 DIAGNOSIS — I132 Hypertensive heart and chronic kidney disease with heart failure and with stage 5 chronic kidney disease, or end stage renal disease: Secondary | ICD-10-CM | POA: Diagnosis present

## 2021-10-06 DIAGNOSIS — R0609 Other forms of dyspnea: Secondary | ICD-10-CM | POA: Diagnosis not present

## 2021-10-06 DIAGNOSIS — F419 Anxiety disorder, unspecified: Secondary | ICD-10-CM

## 2021-10-06 DIAGNOSIS — Z1152 Encounter for screening for COVID-19: Secondary | ICD-10-CM | POA: Diagnosis not present

## 2021-10-06 DIAGNOSIS — I5020 Unspecified systolic (congestive) heart failure: Secondary | ICD-10-CM | POA: Diagnosis not present

## 2021-10-06 DIAGNOSIS — Z880 Allergy status to penicillin: Secondary | ICD-10-CM | POA: Diagnosis not present

## 2021-10-06 DIAGNOSIS — Z91030 Bee allergy status: Secondary | ICD-10-CM | POA: Diagnosis not present

## 2021-10-06 DIAGNOSIS — Z91013 Allergy to seafood: Secondary | ICD-10-CM | POA: Diagnosis not present

## 2021-10-06 DIAGNOSIS — Z992 Dependence on renal dialysis: Secondary | ICD-10-CM | POA: Diagnosis not present

## 2021-10-06 DIAGNOSIS — Z881 Allergy status to other antibiotic agents status: Secondary | ICD-10-CM | POA: Diagnosis not present

## 2021-10-06 DIAGNOSIS — R0902 Hypoxemia: Secondary | ICD-10-CM | POA: Diagnosis present

## 2021-10-06 DIAGNOSIS — I5021 Acute systolic (congestive) heart failure: Secondary | ICD-10-CM | POA: Diagnosis not present

## 2021-10-06 DIAGNOSIS — I2489 Other forms of acute ischemic heart disease: Secondary | ICD-10-CM | POA: Diagnosis present

## 2021-10-06 DIAGNOSIS — I5023 Acute on chronic systolic (congestive) heart failure: Secondary | ICD-10-CM | POA: Diagnosis present

## 2021-10-06 DIAGNOSIS — I444 Left anterior fascicular block: Secondary | ICD-10-CM | POA: Diagnosis present

## 2021-10-06 DIAGNOSIS — I428 Other cardiomyopathies: Secondary | ICD-10-CM | POA: Diagnosis present

## 2021-10-06 DIAGNOSIS — D631 Anemia in chronic kidney disease: Secondary | ICD-10-CM | POA: Diagnosis present

## 2021-10-06 LAB — RENAL FUNCTION PANEL
Albumin: 2.6 g/dL — ABNORMAL LOW (ref 3.5–5.0)
Anion gap: 11 (ref 5–15)
BUN: 40 mg/dL — ABNORMAL HIGH (ref 8–23)
CO2: 26 mmol/L (ref 22–32)
Calcium: 8 mg/dL — ABNORMAL LOW (ref 8.9–10.3)
Chloride: 99 mmol/L (ref 98–111)
Creatinine, Ser: 7.22 mg/dL — ABNORMAL HIGH (ref 0.44–1.00)
GFR, Estimated: 6 mL/min — ABNORMAL LOW (ref >60)
Glucose, Bld: 101 mg/dL — ABNORMAL HIGH (ref 70–99)
Phosphorus: 5.6 mg/dL — ABNORMAL HIGH (ref 2.5–4.6)
Potassium: 4.1 mmol/L (ref 3.5–5.1)
Sodium: 136 mmol/L (ref 135–145)

## 2021-10-06 LAB — CBC
HCT: 36.9 % (ref 36.0–46.0)
Hemoglobin: 11.3 g/dL — ABNORMAL LOW (ref 12.0–15.0)
MCH: 29.6 pg (ref 26.0–34.0)
MCHC: 30.6 g/dL (ref 30.0–36.0)
MCV: 96.6 fL (ref 80.0–100.0)
Platelets: 108 10*3/uL — ABNORMAL LOW (ref 150–400)
RBC: 3.82 MIL/uL — ABNORMAL LOW (ref 3.87–5.11)
RDW: 15.6 % — ABNORMAL HIGH (ref 11.5–15.5)
WBC: 8.3 10*3/uL (ref 4.0–10.5)
nRBC: 0 % (ref 0.0–0.2)

## 2021-10-06 LAB — ECHOCARDIOGRAM COMPLETE
Area-P 1/2: 5.66 cm2
Calc EF: 34.7 %
Height: 63 in
MV M vel: 4.75 m/s
MV Peak grad: 90.1 mmHg
Radius: 0.3 cm
S' Lateral: 4.1 cm
Single Plane A2C EF: 35.2 %
Single Plane A4C EF: 32.9 %
Weight: 1780.8 oz

## 2021-10-06 LAB — HIV ANTIBODY (ROUTINE TESTING W REFLEX): HIV Screen 4th Generation wRfx: NONREACTIVE

## 2021-10-06 LAB — TROPONIN I (HIGH SENSITIVITY)
Troponin I (High Sensitivity): 217 ng/L (ref <18)
Troponin I (High Sensitivity): 205 ng/L (ref <18)

## 2021-10-06 MED ORDER — PERFLUTREN LIPID MICROSPHERE
1.0000 mL | INTRAVENOUS | Status: DC | PRN
  Administered 2021-10-06: 4 mL via INTRAVENOUS

## 2021-10-06 MED ORDER — SODIUM CHLORIDE 0.9 % IV SOLN: INTRAVENOUS | Status: DC

## 2021-10-06 MED ORDER — SODIUM CHLORIDE 0.9% FLUSH
3.0000 mL | Freq: Two times a day (BID) | INTRAVENOUS | Status: DC
  Administered 2021-10-06 – 2021-10-07 (×2): 3 mL via INTRAVENOUS

## 2021-10-06 MED ORDER — ADULT MULTIVITAMIN W/MINERALS CH
1.0000 | ORAL_TABLET | Freq: Every day | ORAL | Status: DC
  Administered 2021-10-06 – 2021-10-08 (×3): 1 via ORAL
  Filled 2021-10-06 (×3): qty 1

## 2021-10-06 MED ORDER — HEPARIN SODIUM (PORCINE) 5000 UNIT/ML IJ SOLN
5000.0000 [IU] | Freq: Three times a day (TID) | INTRAMUSCULAR | Status: DC
  Administered 2021-10-06 – 2021-10-07 (×4): 5000 [IU] via SUBCUTANEOUS
  Filled 2021-10-06 (×4): qty 1

## 2021-10-06 MED ORDER — ACETAMINOPHEN 500 MG PO TABS: 500.0000 mg | ORAL_TABLET | Freq: Four times a day (QID) | ORAL | Status: DC | PRN

## 2021-10-06 MED ORDER — METOPROLOL TARTRATE 25 MG PO TABS
25.0000 mg | ORAL_TABLET | Freq: Two times a day (BID) | ORAL | Status: DC
  Administered 2021-10-06 (×2): 25 mg via ORAL
  Filled 2021-10-06 (×2): qty 1

## 2021-10-06 MED ORDER — PREDNISONE 20 MG PO TABS
50.0000 mg | ORAL_TABLET | Freq: Four times a day (QID) | ORAL | Status: DC
  Administered 2021-10-07 (×2): 50 mg via ORAL
  Filled 2021-10-06 (×3): qty 1

## 2021-10-06 MED ORDER — SODIUM CHLORIDE 0.9 % IV SOLN: 250.0000 mL | INTRAVENOUS | Status: DC | PRN

## 2021-10-06 MED ORDER — ASPIRIN 81 MG PO CHEW
81.0000 mg | CHEWABLE_TABLET | ORAL | Status: AC
  Administered 2021-10-07: 81 mg via ORAL
  Filled 2021-10-06: qty 1

## 2021-10-06 MED ORDER — SODIUM CHLORIDE 0.9% FLUSH: 3.0000 mL | INTRAVENOUS | Status: DC | PRN

## 2021-10-06 MED ORDER — CHLORHEXIDINE GLUCONATE CLOTH 2 % EX PADS: 6.0000 | MEDICATED_PAD | Freq: Every day | CUTANEOUS | Status: DC

## 2021-10-06 MED ORDER — FAMOTIDINE IN NACL 20-0.9 MG/50ML-% IV SOLN
20.0000 mg | Freq: Once | INTRAVENOUS | Status: DC
  Filled 2021-10-06: qty 50

## 2021-10-06 MED ORDER — METOPROLOL TARTRATE 12.5 MG HALF TABLET
12.5000 mg | ORAL_TABLET | Freq: Two times a day (BID) | ORAL | Status: DC
  Administered 2021-10-06 – 2021-10-07 (×2): 12.5 mg via ORAL
  Filled 2021-10-06 (×2): qty 1

## 2021-10-06 MED ORDER — ASPIRIN 81 MG PO CHEW
81.0000 mg | CHEWABLE_TABLET | Freq: Every day | ORAL | Status: DC
  Administered 2021-10-06 – 2021-10-08 (×2): 81 mg via ORAL
  Filled 2021-10-06 (×2): qty 1

## 2021-10-06 NOTE — Assessment & Plan Note (Addendum)
Patient underwent heart cath yesterday, no stent was needed.  Patient was placed on BiDil per cardiology, with consideration of ARB if blood pressure remains elevated.  ESRD, unable to start MRA no SGLT2. PT/OT signed off, no need for further inpatient or outpatient rehab. - Patient home with BiDil, can consider ARB outpatient - Follow-up with cardiology outpatient

## 2021-10-06 NOTE — Evaluation (Signed)
Physical Therapy Evaluation Patient Details Name: Barbara Crawford MRN: [id number] DOB: [birth year] Today's Date: 10/06/2021  History of Present Illness  66 y.o. female presents to [facility] hospital on 10.3.2023 with SOB. Pt admitted with concern for pulmonary edema 2/2 volume overload. PMH includes ESRD, HTN, Heart failure, anxiety.  Clinical Impression  Pt presents to PT with mild deficits in activity tolerance. Pt reports feeling much better than when arriving to the hospital yesterday, with her breathing returning closer to baseline. Pt does not leave the house much at baseline and requires assistance from family to safely negotiate stairs. Pt will benefit from aggressive mobilization in an effort to improve activity tolerance.       Recommendations for follow up therapy are one component of a multi-disciplinary discharge planning process, led by the attending physician.  Recommendations may be updated based on patient status, additional functional criteria and insurance authorization.  Follow Up Recommendations No PT follow up      Assistance Recommended at Discharge PRN  Patient can return home with the following  Help with stairs or ramp for entrance    Equipment Recommendations None recommended by PT  Recommendations for Other Services       Functional Status Assessment Patient has had a recent decline in their functional status and demonstrates the ability to make significant improvements in function in a reasonable and predictable amount of time.     Precautions / Restrictions Precautions Precautions: Other (comment) Precaution Comments: monitor dyspnea Restrictions Weight Bearing Restrictions: No      Mobility  Bed Mobility Overal bed mobility: Modified Independent             General bed mobility comments: HOB elevated    Transfers Overall transfer level: Independent                      Ambulation/Gait Ambulation/Gait assistance: Modified  independent (Device/Increase time) Gait Distance (Feet): 200 Feet Assistive device: None Gait Pattern/deviations: Step-through pattern, Decreased stride length Gait velocity: reduced Gait velocity interpretation: <1.8 ft/sec, indicate of risk for recurrent falls   General Gait Details: pt with slowed step-through gait, multiple brief standing rest breaks 2/2 fatigue  Stairs Stairs: Yes Stairs assistance: Supervision Stair Management: One rail Right, Step to pattern Number of Stairs: 4    Wheelchair Mobility    Modified Rankin (Stroke Patients Only)       Balance Overall balance assessment: Needs assistance Sitting-balance support: No upper extremity supported, Feet supported Sitting balance-Leahy Scale: Good     Standing balance support: No upper extremity supported, During functional activity Standing balance-Leahy Scale: Good                               Pertinent Vitals/Pain Pain Assessment Pain Assessment: No/denies pain    Home Living Family/patient expects to be discharged to:: Private residence Living Arrangements: Children Available Help at Discharge: Family;Available PRN/intermittently Type of Home: Apartment Home Access: Stairs to enter Entrance Stairs-Rails: Right;Left Entrance Stairs-Number of Steps: flight   Home Layout: One level Home Equipment: Cane - Holiday representative (2 wheels);BSC/3in1      Prior Function Prior Level of Function : Independent/Modified Independent;Working/employed;Driving             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       Hand Dominance   Dominant Hand: Right    Extremity/Trunk Assessment   Upper Extremity Assessment Upper Extremity Assessment: Overall [facility]  for tasks assessed    Lower Extremity Assessment Lower Extremity Assessment: Overall WFL for tasks assessed    Cervical / Trunk Assessment Cervical / Trunk Assessment: Normal  Communication   Communication: No difficulties   Cognition Arousal/Alertness: Awake/alert Behavior During Therapy: WFL for tasks assessed/performed Overall Cognitive Status: Within Functional Limits for tasks assessed                                          General Comments General comments (skin integrity, edema, etc.): VSS on RA    Exercises     Assessment/Plan    PT Assessment Patient needs continued PT services  PT Problem List Decreased activity tolerance;Cardiopulmonary status limiting activity       PT Treatment Interventions Gait training;Functional mobility training;Therapeutic activities;Therapeutic exercise;Patient/family education    PT Goals (Current goals can be found in the Care Plan section)  Acute Rehab PT Goals Patient Stated Goal: to return home PT Goal Formulation: With patient Time For Goal Achievement: 10/20/21 Potential to Achieve Goals: Good Additional Goals Additional Goal #1: Pt will report 1/4 DOE or less when ambulating for >250' to demonstrate improved activity tolerance    Frequency Min 2X/week     Co-evaluation               AM-PAC PT "6 Clicks" Mobility  Outcome Measure Help needed turning from your back to your side while in a flat bed without using bedrails?: None Help needed moving from lying on your back to sitting on the side of a flat bed without using bedrails?: None Help needed moving to and from a bed to a chair (including a wheelchair)?: None Help needed standing up from a chair using your arms (e.g., wheelchair or bedside chair)?: None Help needed to walk in hospital room?: None Help needed climbing 3-5 steps with a railing? : A Little 6 Click Score: 23    End of Session   Activity Tolerance: Patient tolerated treatment well Patient left: in chair;with call bell/phone within reach Nurse Communication: Mobility status PT Visit Diagnosis: Other abnormalities of gait and mobility (R26.89)    Time: 9604-5409 PT Time Calculation (min) (ACUTE  ONLY): 21 min   Charges:   PT Evaluation $PT Eval Low Complexity: [place], PT, DPT Acute Rehabilitation Office [phone]   Zenaida Niece 10/06/2021, 9:21 AM

## 2021-10-06 NOTE — Consult Note (Signed)
Renal Service Consult Note [facility]  Barbara Crawford 10/06/2021 Barbara Blazing, MD Requesting Physician: Dr. Thompson Crawford  Reason for Consult: ESRD pt w/ SOB/ resp distress HPI: The patient is a 66 y.o. year-old w/ hx of ESRD on HD, HFmrEF, HTN and anxiety who presented to ED after sudden onset of SOB and diaphoresis. EMS arrived to SpO2 in the 80s. In ED pt required placement of bipap for resp distress. CXR showed severe alveolar edema on R, vasc congestion on L side. We were asked to see for dialysis. Pt had HD yesterday morning upstairs w/ 3 L UF and post HD her wts was down to 50.5kg which is 2.5 kg under her dry wt. Today her SOB has resolved for the most part and she walked in the halls w/ the RN or PT and didn't have Barbara Crawford.  This has never happened to her before, she has been on HD for about 1 year, with [facility] in [facility].   ROS - denies CP, no joint pain, no HA, no blurry vision, no rash, no diarrhea, no nausea/ vomiting, no dysuria, no difficulty voiding   Past Medical History  Past Medical History:  Diagnosis Date   Anxiety    ESRD (end stage renal disease) on dialysis ([place])    Heart failure with mildly reduced ejection fraction (HFmrEF) ([place])    Hypertension    Past Surgical History  Past Surgical History:  Procedure Laterality Date   AV FISTULA PLACEMENT Left 03/13/2020   Procedure: Creation of Left Brachiocephalic fistula;  Surgeon: Barbara Heck, MD;  Location: [place];  Service: Vascular;  Laterality: Left;   IR FLUORO GUIDE CV LINE RIGHT  02/26/2020   IR FLUORO GUIDE CV LINE RIGHT  03/09/2020   IR US GUIDE VASC ACCESS RIGHT  02/26/2020   IR US GUIDE VASC ACCESS RIGHT  03/09/2020   Family History History reviewed. No pertinent family history. Social History  reports that she has never smoked. She has never used smokeless tobacco. She reports current alcohol use. She reports that she does not use drugs. Allergies  Allergies  Allergen  Reactions   Bee Venom Anaphylaxis   Shellfish-Derived Products Swelling and Other (See Comments)    Sweat and Dizzy   Erythromycin Other (See Comments)    Pt does not recall   Penicillins     Has patient had a PCN reaction causing immediate rash, facial/tongue/throat swelling, SOB or lightheadedness with hypotension: no Has patient had a PCN reaction causing severe rash involving mucus membranes or skin necrosis: yes Has patient had a PCN reaction that required hospitalization: no Has patient had a PCN reaction occurring within the last 10 years: no If all of the above answers are "NO", then may proceed with Cephalosporin use.   Chlorhexidine Rash   Iodine Rash and Other (See Comments)    Arm flares up per pt   Home medications Prior to Admission medications   Medication Sig Start Date End Date Taking? Authorizing Provider  acetaminophen (TYLENOL) 500 MG tablet Take 500 mg by mouth every 6 (six) hours as needed for mild pain or moderate pain.   Yes [provider]  aspirin 81 MG chewable tablet Chew 81 mg by mouth daily.   Yes [provider]  ferric citrate (AURYXIA) 1 GM 210 MG(Fe) tablet Take 210 mg by mouth 3 (three) times daily with meals.   Yes [provider]  ferrous sulfate 325 (65 FE) MG tablet Take  325 mg by mouth daily with breakfast.   Yes [provider]  metoprolol tartrate (LOPRESSOR) 25 MG tablet Take 1 tablet (25 mg total) by mouth 2 (two) times daily. 08/18/20  Yes Barbara August, MD  Multiple Vitamin (MULTIVITAMIN WITH MINERALS) TABS tablet Take 1 tablet by mouth daily.   Yes [provider]  NIFEdipine (ADALAT CC) 60 MG 24 hr tablet Take 60 mg by mouth daily. Patient not taking: Reported on 10/05/2021 07/23/20   [provider]     Vitals:   10/06/21 0834 10/06/21 1048 10/06/21 1118 10/06/21 1148  BP: 116/64 124/77 105/70 107/66  Pulse: 92 94 92 89  Resp:      Temp:      TempSrc:      SpO2: 97% 98% 97% 95%   Weight:      Height:       Exam Gen alert, no distress No rash, cyanosis or gangrene Sclera anicteric, throat clear  No jvd or bruits Chest clear bilat to bases, no rales/ wheezing RRR no MRG Abd soft ntnd no mass or ascites +bs GU defer MS no joint effusions or deformity Ext bilat ankle edema is resolved, no wounds or ulcers Neuro is alert, Ox 3 , nf    LUA AVG +bruit      Home meds include - aspirin, metoprolol 25 bid, MVI     OP HD: Barbara [address] / [facility] TTS   53kg  LUA AVF  Hep none  350/ 600   Assessment/ Plan: Resp distress/ pulm edema - due to vol overload most likely, suspect she has been losing body wt. She is quite compliant w/ her HD. Post HD she is 2.5 kg under dry wt. Will plan HD tomorrow and lower dry wt further as is tolerated.  ESRD - on HD TTS as above. HD tomorrow.  Barbara Crawford / EKG changes - being seen by cardiology today HTN - BP's are low, will lower dose of metoprolol for now as we will need BP up tomorrow to get more volume off.  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       Barbara Kilee Hedding  MD 10/06/2021, 1:57 PM Recent Labs  Lab 10/05/21 0744 10/05/21 0834 10/06/21 0440  HGB 12.8 14.6 11.3*  ALBUMIN 3.0*  --  2.6*  CALCIUM 8.8*  --  8.0*  PHOS  --   --  5.6*  CREATININE 9.25*  --  7.22*  K 4.2 3.7 4.1   Inpatient medications:  aspirin  81 mg Oral Daily   heparin  5,000 Units Subcutaneous Q8H   metoprolol tartrate  25 mg Oral BID   multivitamin with minerals  1 tablet Oral Daily   sodium chloride flush  3 mL Intravenous Q12H    [START ON 10/07/2021] famotidine (PEPCID) IV     acetaminophen, heparin

## 2021-10-06 NOTE — Assessment & Plan Note (Addendum)
HR <100 on metoprolol.  No further recs per cardiology. - Continue metoprolol

## 2021-10-06 NOTE — Assessment & Plan Note (Deleted)
Breathing comfortably on room air.  Edema improved after hemodialysis.  Likely secondary to worsening congestive heart failure and ESRD requiring hemodialysis.  Nephrology was consulted, and recommended continuing dialysis regimen as scheduled.  No need for emergent dialysis at this time. - Nephrology consulted, appreciate continued recs - HD TTS - Cardiac monitoring - O2 as needed, can escalate to BiPAP - Renal diet with fluid restriction

## 2021-10-06 NOTE — Assessment & Plan Note (Addendum)
Stable and WNL. Decreased slightly from admission after HD.  -Metoprolol 12.5 mg twice daily -Vitals per floor

## 2021-10-06 NOTE — Assessment & Plan Note (Deleted)
Anxiety is improved now that SOB has resolved.  This can be followed as outpatient.

## 2021-10-06 NOTE — Progress Notes (Signed)
FMTS Interim Progress Note  S: This patient was admitted after midnight, and I went to evaluate at bedside.  Patient says that she is feeling better, anxiety has resolved-due to being able to breathe again, would like a work note.  O: BP 102/70 (BP Location: Right Arm)   Pulse 91   Temp 98.6 F (37 C) (Oral)   Resp 20   Ht 5\' 3"  (1.6 m)   Wt 50.5 kg   SpO2 94%   BMI 19.72 kg/m   General: Not in acute distress, pleasant Cardio: Tachycardic with regular rhythm Respiratory: CTAB, normal work of breathing on room air Extremities: Minimal edema in left lower extremity  A/P: Pulmonary edema Patient has normal work of breathing on room air.  Appears relatively comfortable.  This is likely fluid volume overload 2/2 ESRD/CHF.  Well score for PE 1.5 for tachycardia, however there were no other risk factors for PE and dyspnea likely secondary to ESRD/CHF.  She is euvolemic on exam, and breathing well today. - Follow-up echo to assess cardiac function - O2 as needed - Consider VQ scan to assess for PE if clinical presentation worsens  Anxiety Patient reports improved anxiety. - Follow-up outpatient for anxiety  CHF tachycardia Heart rate is below 100.  -Continue home metoprolol 25 twice daily -Follow-up echo  Disposition: Home today or tomorrow Leslie Dales, DO 10/06/2021, 7:50 AM PGY-1, [place] Medicine Service pager [phone]

## 2021-10-06 NOTE — Progress Notes (Signed)
CSW received consult for food resources for patient. CSW spoke with patient at bedside. CSW offered patient food resources. Patient accepted. Patient reports her son and daughter will provide transport for her at dc. All questions answered. No further questions reported at this time.

## 2021-10-06 NOTE — H&P (Addendum)
Hospital Admission History and Physical Service Pager: [phone]  Patient name: Barbara Crawford Medical record number: [id number] Date of Birth: 03/12/1955 Age: 66 y.o. Gender: female  Primary Care Provider: Gerald Leitz Consultants: Nephrology  Code Status: FULL CODE  Preferred Emergency Contact:  Contact Information       Name Relation Home Work [place], [place] Daughter     [phone]                 Juanda Bond Sister     [phone]    Ford,Frederick  Son     [phone]    Chief Complaint: Shortness of breath   Assessment and Plan: Barbara Crawford is a 66 y.o. female presenting with SOB. Differential for this patient's presentation of this includes pulmonary edema 2/2 volume overload (most likely given increased fluid intake before presentation, elevated BNP with hx HFmrEF, and improvement of symptoms after HD), infection (less likely given afebrile and resolution of symptoms after HD), pulmonary embolism (less likely given no chest pain, overall improvement after HD), and ACS (less likely given no chest pain, reassuring EKG, and flat troponins).  * Pulmonary edema On room air with continued tachypnea to the 30s though appears relatively comfortable. Suspect etiology either worsening CHF vs increased volume intake in between HD. Exam consistent with pulmonary edema though overall reassuring given no respiratory distress on RA. Makes minimal urine, will need to rely on HD for fluid removal. Given initial severity of symptoms requiring BiPAP and continued tachypnea after HD, will admit to med-tele attending Dr. Owens Shark for observation and assessment for potential need for further off schedule HD. -Echo to assess updated EF from previous -F/u nephro recs for potential off schedule HD 10/4 -Cardiac monitoring given tachycardia -Vitals per unit with continuous pulse ox -O2 as needed, can escalate to BiPAP -Renal diet with fluid  restriction  Anxiety Has reported anxiety. At first she thought she may have been having a panic attack. She received 0.5 mg Ativan x2 in the ED with improvement in her anxiety.  -Monitor  -Consider starting SSRI outpatient   CHF (congestive heart failure) ([place]) Last EF was 45% in 2022. BNP today elevated to 2257. Will obtain echo as above to assess progression of HF as this is suspected as part of her presentation. Will also trend I/Os and daily weights.  Tachycardia Hx of chronic tachycardia per patient report. Asymptomatic without palpitations. Wells score for PE 1.5 for tachycardia- no other risk factors and given known pulmonary edema with elevated BNP feel that PE less likely. -Continuous telemetry -Consider V/Q scan to assess for PE if clinical presentation worsens   HTN (hypertension) Stable and WNL. Decreased slightly from admission after HD.  -Restarted home metoprolol 25 mg BID -Vitals per floor   FEN/GI: Renal  VTE Prophylaxis: Subq heparin given ESRD   Disposition: Med-tele   History of Present Illness:  Barbara Crawford is a 66 y.o. female presenting with shortness of breath since this AM.    States that she woke up and went to the bathroom as she always does. She then began to feel very short of breath. She put an ice pack over her heart. She thought perhaps it was due to anxiety. She called her daughter who recommended that she call an ambulance to be assessed. She reports that since coming to the ED she has had dialysis and they removed approximately 3.3 L. She reports hx of similar  sx but not recently. She is compliant with her dialysis sessions.    She notes that she tends to crave ice. She also does enjoy foods that contain salt. She thinks that she "over did it yesterday". She thinks she may have had too much salt yesterday. She had a beef patty with rice and vegetables yesterday along with juice and ice. She had more juice and ice than usual for her.     Denies any recent fevers, chills. Endorses nausea which is baseline for her. She endorses diaphoresis this AM when she was sitting outside. No CP, edema.    In the ED, was placed on 6L [place] and found to have elevated BP. She attended dialysis and was placed on BiPAP. She was eventually weaned off BiPAP but did not feel safe to go home and wanted to be observed overnight.   Review Of Systems: Per HPI.   Pertinent Past Medical History: ESRD on HD TTS; still makes small amount of urine  HTN HFmrEF  Anxiety  Remainder reviewed in history tab.    Pertinent Past Surgical History: Left AV fistula  Remainder reviewed in history tab.   Pertinent Social History: Tobacco use: Former, as a teenager "trying to fit in" (not daily) Alcohol use: No Other Substance use: Marijuana as a teenager  Lives with daughter.   Pertinent Family History: Type 2 DM in sister HTN in two sons  Remainder reviewed in history tab.    Important Outpatient Medications: Metoprolol 25 mg BID  Aspirin 81 mg  Ferrous sulfate 325 mg daily  Tylenol 500 mg PRN headaches MVI  daily  Remainder reviewed in medication history.  Objective: BP 117/65   Pulse 99   Temp 98.8 F (37.1 C) (Oral)   Resp (!) 25   Wt 51.3 kg   SpO2 94%   BMI 20.69 kg/m  Exam: General: Alert and oriented, in NAD Skin: Warm, dry, and intact HEENT: NCAT, EOM grossly normal, midline nasal septum Cardiac: Tachycardic, regular rhythm, no m/r/g Respiratory: Rhonchi heard in bilateral lower lung bases, breathing and speaking comfortably on RA, RR 30s Abdominal: Soft, nontender, nondistended, normoactive bowel sounds Extremities: Moves all extremities grossly equally, trace pitting edema of BLE up to knees Neurological: No gross focal deficit, speech is clear, answers questions appropriately, moves all extremities spontaneously Psychiatric: Appropriate mood and affect   Labs:  CBC BMET  Recent Labs  Lab 10/05/21 0744 10/05/21 0834  WBC  6.9  --   HGB 12.8 14.6  HCT 42.7 43.0  PLT 158  --    Recent Labs  Lab 10/05/21 0744 10/05/21 0834  NA 143 143  K 4.2 3.7  CL 103  --   CO2 22  --   BUN 55*  --   CREATININE 9.25*  --   GLUCOSE 172*  --   CALCIUM 8.8*  --      BNP: 2257.2 Troponin 64>77  EKG: sinus tachycardia without STEMI, BBB  Imaging Studies Performed:  CXR IMPRESSION: Cardiomegaly.  Pulmonary edema.  Small left pleural effusion.  Ethelene Hal, MD 10/06/2021, 1:12 AM PGY-1, [place] Intern pager: [phone], text pages welcome Secure chat group [place] Upper-Level Resident Addendum   I have independently interviewed and examined the patient. I have discussed the above with the original author and agree with their documentation. My edits for correction/addition/clarification are included where appropriate. Please see also any attending notes.   Sharion Settler, DO  PGY-3, [place] Medicine 10/06/2021 1:41 AM  FPTS Service pager: [phone] (text pages welcome through [facility])

## 2021-10-06 NOTE — Evaluation (Signed)
Occupational Therapy Evaluation Patient Details Name: Barbara Crawford MRN: [id number] DOB: Feb 22, 1955 Today's Date: 10/06/2021   History of Present Illness 66 y.o. female presents to [facility] hospital on 10.3.2023 with SOB. Pt admitted with concern for pulmonary edema 2/2 volume overload. PMH includes ESRD, HTN, Heart failure, anxiety.   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  Currently she is very close to her baseline, and no acute OT needs are identified.  Her primary concern, is that she is scheduled for a cardiac cath tomorrow, and she is very worried about it.  Advised the patient to talk with her RN, and make sure she understands what the procedure is.  Left with staff member from cardiac team.  No post acute OT anticipated.         Recommendations for follow up therapy are one component of a multi-disciplinary discharge planning process, led by the attending physician.  Recommendations may be updated based on patient status, additional functional criteria and insurance authorization.   Follow Up Recommendations  No OT follow up    Assistance Recommended at Discharge Set up Supervision/Assistance  Patient can return home with the following Assist for transportation;Assistance with cooking/housework    Functional Status Assessment  Patient has not had a recent decline in their functional status  Equipment Recommendations  None recommended by OT    Recommendations for Other Services       Precautions / Restrictions Precautions Precautions: None Restrictions Weight Bearing Restrictions: No      Mobility Bed Mobility Overal bed mobility: Modified Independent                  Transfers Overall transfer level: Independent                        Balance Overall balance assessment: Needs assistance Sitting-balance support: Feet supported Sitting balance-Leahy Scale: Good      Standing balance support: No upper extremity supported Standing balance-Leahy Scale: Good                             ADL either performed or assessed with clinical judgement   ADL Overall ADL's : At baseline                                             Vision Patient Visual Report: No change from baseline       Perception Perception Perception: Within Functional Limits   Praxis Praxis Praxis: Intact    Pertinent Vitals/Pain Pain Assessment Pain Assessment: No/denies pain     Hand Dominance Right   Extremity/Trunk Assessment Upper Extremity Assessment Upper Extremity Assessment: Overall WFL for tasks assessed   Lower Extremity Assessment Lower Extremity Assessment: Defer to PT evaluation   Cervical / Trunk Assessment Cervical / Trunk Assessment: Normal   Communication Communication Communication: No difficulties   Cognition Arousal/Alertness: Awake/alert Behavior During Therapy: WFL for tasks assessed/performed Overall Cognitive Status: Within Functional Limits for tasks assessed                                       General Comments   VSS on RA  Exercises     Shoulder Instructions      Home Living Family/patient expects to be discharged to:: Private residence Living Arrangements: Children Available Help at Discharge: Family;Available PRN/intermittently Type of Home: Apartment Home Access: Stairs to enter Entrance Stairs-Number of Steps: flight Entrance Stairs-Rails: Right;Left Home Layout: One level     Bathroom Shower/Tub: Occupational psychologist: Standard     Home Equipment: Cane - Holiday representative (2 wheels);BSC/3in1          Prior Functioning/Environment Prior Level of Function : Independent/Modified Independent;Working/employed;Driving             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          OT Problem List: Decreased activity tolerance      OT  Treatment/Interventions:      OT Goals(Current goals can be found in the care plan section) Acute Rehab OT Goals Patient Stated Goal: Understand what procedure I have tomorrow OT Goal Formulation: With patient Time For Goal Achievement: 10/11/21 Potential to Achieve Goals: Good  OT Frequency:      Co-evaluation              AM-PAC OT "6 Clicks" Daily Activity     Outcome Measure Help from another person eating meals?: None Help from another person taking care of personal grooming?: None Help from another person toileting, which includes using toliet, bedpan, or urinal?: None Help from another person bathing (including washing, rinsing, drying)?: None Help from another person to put on and taking off regular upper body clothing?: None Help from another person to put on and taking off regular lower body clothing?: None 6 Click Score: 24   End of Session Nurse Communication: Mobility status  Activity Tolerance: Patient tolerated treatment well Patient left: in bed;with call bell/phone within reach  OT Visit Diagnosis: Unsteadiness on feet (R26.81)                Time: 1829-9371 OT Time Calculation (min): 22 min Charges:  OT General Charges $OT Visit: 1 Visit OT Evaluation $OT Eval Moderate Complexity: 1 Mod  10/06/2021  RP, OTR/L  Acute Rehabilitation Services  Office:  [phone]   Metta Clines 10/06/2021, 2:11 PM

## 2021-10-06 NOTE — Progress Notes (Signed)
We were alerted by nursing staff that patient had elevated troponin. We ordered EKG.  EKG showed some T wave inversion in V4-V6, concerning for possible ischemic change.  Patient is asymptomatic at this time.  We placed cardiology consult.

## 2021-10-06 NOTE — Progress Notes (Signed)
CRITICAL VALUE ALERT  Critical value received:  troponin 216  Date of notification:  10/06/21  Time of notification:  3005  Critical value read back:Yes.    Nurse who received alert:  Dina Rich RN  MD notified (1st page):  Zola Button   Time of first page:  1027  Responding MD:  Zola Button   Time MD responded:  [phone]

## 2021-10-06 NOTE — Consult Note (Addendum)
Cardiology Consultation   Patient ID: Barbara Crawford MRN: [id number]; DOB: [birth year]  Admit date: 10/05/2021 Date of Consult: 10/06/2021  PCP:  Osborne, Tirrany T, [place] Providers Cardiologist:  None   New   Patient Profile:   Barbara Crawford is a 66 y.o. female with a hx of ESRD on HD, HFmrEF, HTN and anxiety who is being seen 10/06/2021 for the evaluation of elevated troponin at the request of Dr. Thompson Grayer.  History of Present Illness:   [name] is a 66 yo female with PMH noted above. Has not been seen by cardiology in the past. Has been on HD about 1 1/2 years and overall tolerates well. 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 She has not had any anginal symptoms with activity prior to admission.    Presented to the ED on 7/3 with complaints of sudden onset shortness of breath that woke her from sleep.  She was noted to be diaphoretic and had lost control of her bowels.  EMS was called to the house and she was noted to be hypoxic with room air sats in the 80s on their arrival.  She was placed on nasal cannula at 6 L with improvement and brought to the ED for further evaluation.   In the ED her labs showed sodium 143, potassium 4.2, creatinine 9.25, BNP 2257, high-sensitivity troponin 64>> 77, WBC 6.9, hemoglobin 12.8.  EKG showed sinus tachycardia, 129 bpm, left anterior fascicular block, LVH with nonspecific changes.  Chest x-ray with cardiomegaly, pulmonary edema with small left-sided pleural effusion.  COVID/flu negative.  She was seen by nephrology and taken for urgent HD.  Did have symptom improvement after dialysis session but continued to be short of breath therefore was admitted to internal medicine for further management.   Overall, reporting improvement in symptoms and was hopeful for discharge today. Additional troponin was check this morning and noted at 217. There was concern regarding EKG  changes with TWI in precordial leads when compared to admission EKG but TWI noted on prior EKG tracing from July. In talking with patient, she denies having had any chest pain prior to or during admission. Though does have risk factors for CAD. Echo done this morning is pending read.   Past Medical History:  Diagnosis Date   Anxiety    ESRD (end stage renal disease) on dialysis ([place])    Heart failure with mildly reduced ejection fraction (HFmrEF) (HCC)    Hypertension     Past Surgical History:  Procedure Laterality Date   AV FISTULA PLACEMENT Left 03/13/2020   Procedure: Creation of Left Brachiocephalic fistula;  Surgeon: Marty Heck, MD;  Location: [facility];  Service: Vascular;  Laterality: Left;   IR FLUORO GUIDE CV LINE RIGHT  02/26/2020   IR FLUORO GUIDE CV LINE RIGHT  03/09/2020   IR US GUIDE VASC ACCESS RIGHT  02/26/2020   IR US GUIDE VASC ACCESS RIGHT  03/09/2020     Home Medications:  Prior to Admission medications   Medication Sig Start Date End Date Taking? Authorizing Provider  acetaminophen (TYLENOL) 500 MG tablet Take 500 mg by mouth every 6 (six) hours as needed for mild pain or moderate pain.   Yes [provider]  aspirin 81 MG chewable tablet Chew 81 mg by mouth daily.   Yes [provider]  ferric citrate (AURYXIA) 1 GM 210 MG(Fe) tablet Take 210 mg by mouth  3 (three) times daily with meals.   Yes [provider]  ferrous sulfate 325 (65 FE) MG tablet Take 325 mg by mouth daily with breakfast.   Yes [provider]  metoprolol tartrate (LOPRESSOR) 25 MG tablet Take 1 tablet (25 mg total) by mouth 2 (two) times daily. 08/18/20  Yes Aline August, MD  Multiple Vitamin (MULTIVITAMIN WITH MINERALS) TABS tablet Take 1 tablet by mouth daily.   Yes [provider]  NIFEdipine (ADALAT CC) 60 MG 24 hr tablet Take 60 mg by mouth daily. Patient not taking: Reported on 10/05/2021 07/23/20   [provider]    Inpatient  Medications: Scheduled Meds:  aspirin  81 mg Oral Daily   heparin  5,000 Units Subcutaneous Q8H   metoprolol tartrate  25 mg Oral BID   multivitamin with minerals  1 tablet Oral Daily   Continuous Infusions:  PRN Meds: acetaminophen, heparin, perflutren lipid microspheres (DEFINITY) IV suspension  Allergies:    Allergies  Allergen Reactions   Bee Venom Anaphylaxis   Shellfish-Derived Products Swelling and Other (See Comments)    Sweat and Dizzy   Erythromycin Other (See Comments)    Pt does not recall   Penicillins     Has patient had a PCN reaction causing immediate rash, facial/tongue/throat swelling, SOB or lightheadedness with hypotension: no Has patient had a PCN reaction causing severe rash involving mucus membranes or skin necrosis: yes Has patient had a PCN reaction that required hospitalization: no Has patient had a PCN reaction occurring within the last 10 years: no If all of the above answers are "NO", then may proceed with Cephalosporin use.   Chlorhexidine Rash   Iodine Rash and Other (See Comments)    Arm flares up per pt    Social History:   Social History   Socioeconomic History   Marital status: Single    Spouse name: Not on file   Number of children: Not on file   Years of education: Not on file   Highest education level: Not on file  Occupational History   Not on file  Tobacco Use   Smoking status: Never   Smokeless tobacco: Never  Vaping Use   Vaping Use: Never used  Substance and Sexual Activity   Alcohol use: Yes   Drug use: No   Sexual activity: Not on file  Other Topics Concern   Not on file  Social History Narrative   Not on file   Social Determinants of Health   Financial Resource Strain: Not on file  Food Insecurity: Food Insecurity Present (10/06/2021)   Hunger Vital Sign    Worried About Running Out of Food in the Last Year: Sometimes true    Ran Out of Food in the Last Year: Sometimes true  Transportation Needs: No  Transportation Needs (10/06/2021)   PRAPARE - Hydrologist (Medical): No    Lack of Transportation (Non-Medical): No  Physical Activity: Not on file  Stress: Not on file  Social Connections: Not on file  Intimate Partner Violence: Not At Risk (10/06/2021)   Humiliation, Afraid, Rape, and Kick questionnaire    Fear of Current or Ex-Partner: No    Emotionally Abused: No    Physically Abused: No    Sexually Abused: No    Family History:   History reviewed. No pertinent family history.   ROS:  Please see the history of present illness.   All other ROS reviewed and negative.  Physical Exam/Data:   Vitals:   10/06/21 0335 10/06/21 1048 10/06/21 1118 10/06/21 1148  BP:  124/77 105/70 107/66  Pulse:  94 92 89  Resp:      Temp:      TempSrc:      SpO2:  98% 97% 95%  Weight: 50.5 kg     Height: 5\' 3"  (1.6 m)       Intake/Output Summary (Last 24 hours) at 10/06/2021 1210 Last data filed at 10/05/2021 1725 Gross per 24 hour  Intake --  Output 3300 ml  Net -3300 ml      10/06/2021    3:35 AM 10/06/2021    3:24 AM 10/05/2021    5:41 PM  Last 3 Weights  Weight (lbs) 111 lb 4.8 oz 113 lb 113 lb 1.5 oz  Weight (kg) 50.485 kg 51.256 kg 51.3 kg     Body mass index is 19.72 kg/m.  General:  Thin, older AAF sitting up in bed.  HEENT: normal Neck: no JVD Vascular: No carotid bruits; Distal pulses 2+ bilaterally Cardiac:  normal S1, S2; RRR; 1-4/4 systolic murmur RUSB with radiation into the apex.   Lungs:  clear to auscultation bilaterally, no wheezing, rhonchi or rales  Abd: soft, nontender, no hepatomegaly  Ext: no edema Musculoskeletal:  No deformities, BUE and BLE strength normal and equal Skin: warm and dry  Neuro:  CNs 2-12 intact, no focal abnormalities noted Psych:  Normal affect   EKG:  The EKG was personally reviewed and demonstrates:   Sinus tachycardia, 129 bpm, left anterior fascicular block, LVH with nonspecific changes  Sinus  Rhythm with TWI in precordial leads (similar to tracing from 07/2021)  Telemetry:  Telemetry was personally reviewed and demonstrates:  Sinus Rhythm  Relevant CV Studies:  Echo: 08/2020  IMPRESSIONS     1. Left ventricular ejection fraction, by estimation, is 45%. The left  ventricle has mildly decreased function. The left ventricle demonstrates  global hypokinesis. Left ventricular diastolic parameters are consistent  with Grade II diastolic dysfunction  (pseudonormalization).   2. Right ventricular systolic function is normal. The right ventricular  size is normal. There is mildly elevated pulmonary artery systolic  pressure. The estimated right ventricular systolic pressure is 81.8 mmHg.   3. Left atrial size was moderately dilated.   4. Moderate pleural effusion.   5. The mitral valve is normal in structure. Mild to moderate mitral valve  regurgitation. No evidence of mitral stenosis.   6. Tricuspid valve regurgitation is moderate.   7. The aortic valve is tricuspid. There is moderate thickening of the  aortic valve. Aortic valve regurgitation is not visualized. No aortic  stenosis is present.   Comparison(s): A prior study was performed on 03/06/20. LV is less vigorous.   FINDINGS   Left Ventricle: Left ventricular ejection fraction, by estimation, is  45%. The left ventricle has mildly decreased function. The left ventricle  demonstrates global hypokinesis. The left ventricular internal cavity size  was normal in size. There is no  left ventricular hypertrophy. Left ventricular diastolic parameters are  consistent with Grade II diastolic dysfunction (pseudonormalization).   Right Ventricle: The right ventricular size is normal. No increase in  right ventricular wall thickness. Right ventricular systolic function is  normal. There is mildly elevated pulmonary artery systolic pressure. The  tricuspid regurgitant velocity is 2.67   m/s, and with an assumed right atrial  pressure of 8 mmHg, the estimated  right ventricular systolic pressure is 56.3 mmHg.  Left Atrium: Left atrial size was moderately dilated.   Right Atrium: Right atrial size was normal in size.   Pericardium: There is no evidence of pericardial effusion.   Mitral Valve: The mitral valve is normal in structure. Mild to moderate  mitral valve regurgitation. No evidence of mitral valve stenosis.   Tricuspid Valve: The tricuspid valve is normal in structure. Tricuspid  valve regurgitation is moderate.   Aortic Valve: The aortic valve is tricuspid. There is moderate thickening  of the aortic valve. Aortic valve regurgitation is not visualized. No  aortic stenosis is present.   Pulmonic Valve: The pulmonic valve was grossly normal. Pulmonic valve  regurgitation is not visualized. No evidence of pulmonic stenosis.   Aorta: The aortic root is normal in size and structure.   IAS/Shunts: The atrial septum is grossly normal.   Additional Comments: There is a moderate pleural effusion.   Laboratory Data:  High Sensitivity Troponin:   Recent Labs  Lab 10/05/21 0744 10/05/21 0952 10/06/21 0918  TROPONINIHS 64* 77* 217*     Chemistry Recent Labs  Lab 10/05/21 0744 10/05/21 0834 10/06/21 0440  NA 143 143 136  K 4.2 3.7 4.1  CL 103  --  99  CO2 22  --  26  GLUCOSE 172*  --  101*  BUN 55*  --  40*  CREATININE 9.25*  --  7.22*  CALCIUM 8.8*  --  8.0*  MG 2.2  --   --   GFRNONAA 4*  --  6*  ANIONGAP 18*  --  11    Recent Labs  Lab 10/05/21 0744 10/06/21 0440  PROT 8.1  --   ALBUMIN 3.0* 2.6*  AST 34  --   ALT 24  --   ALKPHOS 72  --   BILITOT 0.6  --    Lipids No results for input(s): "CHOL", "TRIG", "HDL", "LABVLDL", "LDLCALC", "CHOLHDL" in the last 168 hours.  Hematology Recent Labs  Lab 10/05/21 0744 10/05/21 0834 10/06/21 0440  WBC 6.9  --  8.3  RBC 4.25  --  3.82*  HGB 12.8 14.6 11.3*  HCT 42.7 43.0 36.9  MCV 100.5*  --  96.6  MCH 30.1  --  29.6  MCHC  30.0  --  30.6  RDW 15.9*  --  15.6*  PLT 158  --  108*   Thyroid No results for input(s): "TSH", "FREET4" in the last 168 hours.  BNP Recent Labs  Lab 10/05/21 0735  BNP 2,257.2*    DDimer No results for input(s): "DDIMER" in the last 168 hours.   Radiology/Studies:  DG Chest Port 1 View  Result Date: 10/05/2021 CLINICAL DATA:  Shortness of breath EXAM: PORTABLE CHEST 1 VIEW COMPARISON:  Previous studies including the examination of 08/16/2020 FINDINGS: Transverse diameter of heart is increased. Central pulmonary vessels are prominent. Diffuse alveolar densities are seen in both lungs, more so on the right side suggesting pulmonary edema. Possibility of underlying pneumonia is not excluded. Small left pleural effusion is seen. There is no pneumothorax. IMPRESSION: Cardiomegaly.  Pulmonary edema.  Small left pleural effusion. Electronically Signed   By: Elmer Picker M.D.   On: 10/05/2021 07:59     Assessment and Plan:   Kiya Eno is a 66 y.o. female with a hx of ESRD on HD, HFmrEF, HTN and anxiety who is being seen 10/06/2021 for the evaluation of elevated troponin at the request of Dr. Thompson Grayer.  Pulmonary Edema HFmrEF -- presented with sudden onset  of shortness of breath. BNP elevated and CXR with pulmonary edema. Reported compliance with HD but suspect her dry weight needed to be lowered. Tolerated HD session with improved dyspnea -- echo 08/2020 with LVEF of 45%, global hypokinesis, g2DD, normal RV size and function, mild to moderate MR. Repeat echo pending official read, but EF does appear to be down. Given this finding would further evaluate with [facility] cath.  -- on metoprolol 25mg  BID, no room for ARB/spiro/SGLT2 in the setting of renal disease   Elevated troponin -- 64>>77 in the setting of volume overload and elevated blood pressures -- recheck of hsTn this morning noted at 217, could be demand ischemia but EF does appear to be down. Therefore would plan for  cardiac cath   ESRD on HD -- TTS schedule and reports compliance. Suspect her dry weight may have dropped, she reports her clothes have been fitting looser.  -- per nephrology  HTN -- initial BP >115 systolic, now significantly improved post HD -- continue metoprolol 25 mg BID  Systolic murmur -- mild to moderate MR on echo 2022, prominent murmur noted on exam -- need to rule out significant AS as a etiology for pulmonary edema -- echo pending  For questions or updates, please contact [place] Please consult [email or web address] for contact info under    Signed, Reino Bellis, NP  10/06/2021 12:10 PM  I have examined the patient and reviewed assessment and plan and discussed with patient.  Agree with above as stated.    Further Decreased EF by echo, which I personally reviewed.  EF was 40%, now appears to be in the 25% range.  Plan for left and right heart cath from femoral approach due to ESRD.  All questions about the procedure answered.  She is quite anxious and definitely wants to be well sedated for the procedure.  Has a dye allergy noted but states she cannot take Benadryl.  Will pretreat with steroids alone.   The patient understands that risks include but are not limited to stroke (1 in 1000), death (1 in 4), kidney failure [usually temporary] (1 in 500), bleeding (1 in 200), allergic reaction [possibly serious] (1 in 200), and agrees to proceed.    Further plans based on cath  [facility]

## 2021-10-06 NOTE — Plan of Care (Signed)
  Problem: Clinical Measurements: Goal: Respiratory complications will improve Outcome: Progressing Goal: Cardiovascular complication will be avoided Outcome: Progressing   Problem: Activity: Goal: Risk for activity intolerance will decrease Outcome: Progressing   Problem: Elimination: Goal: Will not experience complications related to bowel motility Outcome: Progressing Goal: Will not experience complications related to urinary retention Outcome: Not Applicable   Problem: Pain Managment: Goal: General experience of comfort will improve Outcome: Progressing   Problem: Safety: Goal: Ability to remain free from injury will improve Outcome: Progressing   Problem: Skin Integrity: Goal: Risk for impaired skin integrity will decrease Outcome: Progressing

## 2021-10-06 NOTE — H&P (View-Only) (Signed)
Cardiology Consultation   Patient ID: Datra Clary MRN: [id number]; DOB: 1955/05/29  Admit date: 10/05/2021 Date of Consult: 10/06/2021  PCP:  Osborne, Tirrany T, [place] Providers Cardiologist:  None   New   Patient Profile:   Barbara Crawford is a 66 y.o. female with a hx of ESRD on HD, HFmrEF, HTN and anxiety who is being seen 10/06/2021 for the evaluation of elevated troponin at the request of Dr. Thompson Grayer.  History of Present Illness:   Barbara Crawford is a 66 yo female with PMH noted above. Has not been seen by cardiology in the past. Has been on HD about 1 1/2 years and overall tolerates well. 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 She has not had any anginal symptoms with activity prior to admission.    Presented to the ED on 7/3 with complaints of sudden onset shortness of breath that woke her from sleep.  She was noted to be diaphoretic and had lost control of her bowels.  EMS was called to the house and she was noted to be hypoxic with room air sats in the 80s on their arrival.  She was placed on nasal cannula at 6 L with improvement and brought to the ED for further evaluation.   In the ED her labs showed sodium 143, potassium 4.2, creatinine 9.25, BNP 2257, high-sensitivity troponin 64>> 77, WBC 6.9, hemoglobin 12.8.  EKG showed sinus tachycardia, 129 bpm, left anterior fascicular block, LVH with nonspecific changes.  Chest x-ray with cardiomegaly, pulmonary edema with small left-sided pleural effusion.  COVID/flu negative.  She was seen by nephrology and taken for urgent HD.  Did have symptom improvement after dialysis session but continued to be short of breath therefore was admitted to internal medicine for further management.   Overall, reporting improvement in symptoms and was hopeful for discharge today. Additional troponin was check this morning and noted at 217. There was concern regarding EKG  changes with TWI in precordial leads when compared to admission EKG but TWI noted on prior EKG tracing from July. In talking with patient, she denies having had any chest pain prior to or during admission. Though does have risk factors for CAD. Echo done this morning is pending read.   Past Medical History:  Diagnosis Date   Anxiety    ESRD (end stage renal disease) on dialysis ([place])    Heart failure with mildly reduced ejection fraction (HFmrEF) (HCC)    Hypertension     Past Surgical History:  Procedure Laterality Date   AV FISTULA PLACEMENT Left 03/13/2020   Procedure: Creation of Left Brachiocephalic fistula;  Surgeon: Marty Heck, MD;  Location: [facility];  Service: Vascular;  Laterality: Left;   IR FLUORO GUIDE CV LINE RIGHT  02/26/2020   IR FLUORO GUIDE CV LINE RIGHT  03/09/2020   IR US GUIDE VASC ACCESS RIGHT  02/26/2020   IR US GUIDE VASC ACCESS RIGHT  03/09/2020     Home Medications:  Prior to Admission medications   Medication Sig Start Date End Date Taking? Authorizing Provider  acetaminophen (TYLENOL) 500 MG tablet Take 500 mg by mouth every 6 (six) hours as needed for mild pain or moderate pain.   Yes [provider]  aspirin 81 MG chewable tablet Chew 81 mg by mouth daily.   Yes [provider]  ferric citrate (AURYXIA) 1 GM 210 MG(Fe) tablet Take 210 mg by mouth  3 (three) times daily with meals.   Yes [provider]  ferrous sulfate 325 (65 FE) MG tablet Take 325 mg by mouth daily with breakfast.   Yes [provider]  metoprolol tartrate (LOPRESSOR) 25 MG tablet Take 1 tablet (25 mg total) by mouth 2 (two) times daily. 08/18/20  Yes Aline August, MD  Multiple Vitamin (MULTIVITAMIN WITH MINERALS) TABS tablet Take 1 tablet by mouth daily.   Yes [provider]  NIFEdipine (ADALAT CC) 60 MG 24 hr tablet Take 60 mg by mouth daily. Patient not taking: Reported on 10/05/2021 07/23/20   [provider]    Inpatient  Medications: Scheduled Meds:  aspirin  81 mg Oral Daily   heparin  5,000 Units Subcutaneous Q8H   metoprolol tartrate  25 mg Oral BID   multivitamin with minerals  1 tablet Oral Daily   Continuous Infusions:  PRN Meds: acetaminophen, heparin, perflutren lipid microspheres (DEFINITY) IV suspension  Allergies:    Allergies  Allergen Reactions   Bee Venom Anaphylaxis   Shellfish-Derived Products Swelling and Other (See Comments)    Sweat and Dizzy   Erythromycin Other (See Comments)    Pt does not recall   Penicillins     Has patient had a PCN reaction causing immediate rash, facial/tongue/throat swelling, SOB or lightheadedness with hypotension: no Has patient had a PCN reaction causing severe rash involving mucus membranes or skin necrosis: yes Has patient had a PCN reaction that required hospitalization: no Has patient had a PCN reaction occurring within the last 10 years: no If all of the above answers are "NO", then may proceed with Cephalosporin use.   Chlorhexidine Rash   Iodine Rash and Other (See Comments)    Arm flares up per pt    Social History:   Social History   Socioeconomic History   Marital status: Single    Spouse name: Not on file   Number of children: Not on file   Years of education: Not on file   Highest education level: Not on file  Occupational History   Not on file  Tobacco Use   Smoking status: Never   Smokeless tobacco: Never  Vaping Use   Vaping Use: Never used  Substance and Sexual Activity   Alcohol use: Yes   Drug use: No   Sexual activity: Not on file  Other Topics Concern   Not on file  Social History Narrative   Not on file   Social Determinants of Health   Financial Resource Strain: Not on file  Food Insecurity: Food Insecurity Present (10/06/2021)   Hunger Vital Sign    Worried About Running Out of Food in the Last Year: Sometimes true    Ran Out of Food in the Last Year: Sometimes true  Transportation Needs: No  Transportation Needs (10/06/2021)   PRAPARE - Hydrologist (Medical): No    Lack of Transportation (Non-Medical): No  Physical Activity: Not on file  Stress: Not on file  Social Connections: Not on file  Intimate Partner Violence: Not At Risk (10/06/2021)   Humiliation, Afraid, Rape, and Kick questionnaire    Fear of Current or Ex-Partner: No    Emotionally Abused: No    Physically Abused: No    Sexually Abused: No    Family History:   History reviewed. No pertinent family history.   ROS:  Please see the history of present illness.   All other ROS reviewed and negative.  Physical Exam/Data:   Vitals:   10/06/21 0335 10/06/21 1048 10/06/21 1118 10/06/21 1148  BP:  124/77 105/70 107/66  Pulse:  94 92 89  Resp:      Temp:      TempSrc:      SpO2:  98% 97% 95%  Weight: 50.5 kg     Height: 5\' 3"  (1.6 m)       Intake/Output Summary (Last 24 hours) at 10/06/2021 1210 Last data filed at 10/05/2021 1725 Gross per 24 hour  Intake --  Output 3300 ml  Net -3300 ml      10/06/2021    3:35 AM 10/06/2021    3:24 AM 10/05/2021    5:41 PM  Last 3 Weights  Weight (lbs) 111 lb 4.8 oz 113 lb 113 lb 1.5 oz  Weight (kg) 50.485 kg 51.256 kg 51.3 kg     Body mass index is 19.72 kg/m.  General:  Thin, older AAF sitting up in bed.  HEENT: normal Neck: no JVD Vascular: No carotid bruits; Distal pulses 2+ bilaterally Cardiac:  normal S1, S2; RRR; 3-5/3 systolic murmur RUSB with radiation into the apex.   Lungs:  clear to auscultation bilaterally, no wheezing, rhonchi or rales  Abd: soft, nontender, no hepatomegaly  Ext: no edema Musculoskeletal:  No deformities, BUE and BLE strength normal and equal Skin: warm and dry  Neuro:  CNs 2-12 intact, no focal abnormalities noted Psych:  Normal affect   EKG:  The EKG was personally reviewed and demonstrates:   Sinus tachycardia, 129 bpm, left anterior fascicular block, LVH with nonspecific changes  Sinus  Rhythm with TWI in precordial leads (similar to tracing from 07/2021)  Telemetry:  Telemetry was personally reviewed and demonstrates:  Sinus Rhythm  Relevant CV Studies:  Echo: 08/2020  IMPRESSIONS     1. Left ventricular ejection fraction, by estimation, is 45%. The left  ventricle has mildly decreased function. The left ventricle demonstrates  global hypokinesis. Left ventricular diastolic parameters are consistent  with Grade II diastolic dysfunction  (pseudonormalization).   2. Right ventricular systolic function is normal. The right ventricular  size is normal. There is mildly elevated pulmonary artery systolic  pressure. The estimated right ventricular systolic pressure is 29.9 mmHg.   3. Left atrial size was moderately dilated.   4. Moderate pleural effusion.   5. The mitral valve is normal in structure. Mild to moderate mitral valve  regurgitation. No evidence of mitral stenosis.   6. Tricuspid valve regurgitation is moderate.   7. The aortic valve is tricuspid. There is moderate thickening of the  aortic valve. Aortic valve regurgitation is not visualized. No aortic  stenosis is present.   Comparison(s): A prior study was performed on 03/06/20. LV is less vigorous.   FINDINGS   Left Ventricle: Left ventricular ejection fraction, by estimation, is  45%. The left ventricle has mildly decreased function. The left ventricle  demonstrates global hypokinesis. The left ventricular internal cavity size  was normal in size. There is no  left ventricular hypertrophy. Left ventricular diastolic parameters are  consistent with Grade II diastolic dysfunction (pseudonormalization).   Right Ventricle: The right ventricular size is normal. No increase in  right ventricular wall thickness. Right ventricular systolic function is  normal. There is mildly elevated pulmonary artery systolic pressure. The  tricuspid regurgitant velocity is 2.67   m/s, and with an assumed right atrial  pressure of 8 mmHg, the estimated  right ventricular systolic pressure is 24.2 mmHg.  Left Atrium: Left atrial size was moderately dilated.   Right Atrium: Right atrial size was normal in size.   Pericardium: There is no evidence of pericardial effusion.   Mitral Valve: The mitral valve is normal in structure. Mild to moderate  mitral valve regurgitation. No evidence of mitral valve stenosis.   Tricuspid Valve: The tricuspid valve is normal in structure. Tricuspid  valve regurgitation is moderate.   Aortic Valve: The aortic valve is tricuspid. There is moderate thickening  of the aortic valve. Aortic valve regurgitation is not visualized. No  aortic stenosis is present.   Pulmonic Valve: The pulmonic valve was grossly normal. Pulmonic valve  regurgitation is not visualized. No evidence of pulmonic stenosis.   Aorta: The aortic root is normal in size and structure.   IAS/Shunts: The atrial septum is grossly normal.   Additional Comments: There is a moderate pleural effusion.   Laboratory Data:  High Sensitivity Troponin:   Recent Labs  Lab 10/05/21 0744 10/05/21 0952 10/06/21 0918  TROPONINIHS 64* 77* 217*     Chemistry Recent Labs  Lab 10/05/21 0744 10/05/21 0834 10/06/21 0440  NA 143 143 136  K 4.2 3.7 4.1  CL 103  --  99  CO2 22  --  26  GLUCOSE 172*  --  101*  BUN 55*  --  40*  CREATININE 9.25*  --  7.22*  CALCIUM 8.8*  --  8.0*  MG 2.2  --   --   GFRNONAA 4*  --  6*  ANIONGAP 18*  --  11    Recent Labs  Lab 10/05/21 0744 10/06/21 0440  PROT 8.1  --   ALBUMIN 3.0* 2.6*  AST 34  --   ALT 24  --   ALKPHOS 72  --   BILITOT 0.6  --    Lipids No results for input(s): "CHOL", "TRIG", "HDL", "LABVLDL", "LDLCALC", "CHOLHDL" in the last 168 hours.  Hematology Recent Labs  Lab 10/05/21 0744 10/05/21 0834 10/06/21 0440  WBC 6.9  --  8.3  RBC 4.25  --  3.82*  HGB 12.8 14.6 11.3*  HCT 42.7 43.0 36.9  MCV 100.5*  --  96.6  MCH 30.1  --  29.6  MCHC  30.0  --  30.6  RDW 15.9*  --  15.6*  PLT 158  --  108*   Thyroid No results for input(s): "TSH", "FREET4" in the last 168 hours.  BNP Recent Labs  Lab 10/05/21 0735  BNP 2,257.2*    DDimer No results for input(s): "DDIMER" in the last 168 hours.   Radiology/Studies:  DG Chest Port 1 View  Result Date: 10/05/2021 CLINICAL DATA:  Shortness of breath EXAM: PORTABLE CHEST 1 VIEW COMPARISON:  Previous studies including the examination of 08/16/2020 FINDINGS: Transverse diameter of heart is increased. Central pulmonary vessels are prominent. Diffuse alveolar densities are seen in both lungs, more so on the right side suggesting pulmonary edema. Possibility of underlying pneumonia is not excluded. Small left pleural effusion is seen. There is no pneumothorax. IMPRESSION: Cardiomegaly.  Pulmonary edema.  Small left pleural effusion. Electronically Signed   By: Elmer Picker M.D.   On: 10/05/2021 07:59     Assessment and Plan:   Barbara Crawford is a 66 y.o. female with a hx of ESRD on HD, HFmrEF, HTN and anxiety who is being seen 10/06/2021 for the evaluation of elevated troponin at the request of Dr. Thompson Grayer.  Pulmonary Edema HFmrEF -- presented with sudden onset  of shortness of breath. BNP elevated and CXR with pulmonary edema. Reported compliance with HD but suspect her dry weight needed to be lowered. Tolerated HD session with improved dyspnea -- echo 08/2020 with LVEF of 45%, global hypokinesis, g2DD, normal RV size and function, mild to moderate MR. Repeat echo pending official read, but EF does appear to be down. Given this finding would further evaluate with [facility] cath.  -- on metoprolol 25mg  BID, no room for ARB/spiro/SGLT2 in the setting of renal disease   Elevated troponin -- 64>>77 in the setting of volume overload and elevated blood pressures -- recheck of hsTn this morning noted at 217, could be demand ischemia but EF does appear to be down. Therefore would plan for  cardiac cath   ESRD on HD -- TTS schedule and reports compliance. Suspect her dry weight may have dropped, she reports her clothes have been fitting looser.  -- per nephrology  HTN -- initial BP >701 systolic, now significantly improved post HD -- continue metoprolol 25 mg BID  Systolic murmur -- mild to moderate MR on echo 2022, prominent murmur noted on exam -- need to rule out significant AS as a etiology for pulmonary edema -- echo pending  For questions or updates, please contact Miguel Barrera Please consult [email or web address] for contact info under    Signed, Reino Bellis, NP  10/06/2021 12:10 PM  I have examined the patient and reviewed assessment and plan and discussed with patient.  Agree with above as stated.    Further Decreased EF by echo, which I personally reviewed.  EF was 40%, now appears to be in the 25% range.  Plan for left and right heart cath from femoral approach due to ESRD.  All questions about the procedure answered.  She is quite anxious and definitely wants to be well sedated for the procedure.  Has a dye allergy noted but states she cannot take Benadryl.  Will pretreat with steroids alone.   The patient understands that risks include but are not limited to stroke (1 in 1000), death (1 in 77), kidney failure [usually temporary] (1 in 500), bleeding (1 in 200), allergic reaction [possibly serious] (1 in 200), and agrees to proceed.    Further plans based on cath  [facility]

## 2021-10-07 ENCOUNTER — Inpatient Hospital Stay (HOSPITAL_COMMUNITY)
Admission: EM | Disposition: A | Discharge status: Home / Self Care | Payer: Self-pay | Source: Home / Self Care | Attending: Family Medicine

## 2021-10-07 ENCOUNTER — Telehealth (HOSPITAL_COMMUNITY): Payer: Self-pay | Admitting: Pharmacy Technician

## 2021-10-07 ENCOUNTER — Other Ambulatory Visit (HOSPITAL_COMMUNITY): Payer: Self-pay

## 2021-10-07 ENCOUNTER — Encounter (HOSPITAL_COMMUNITY): Payer: Self-pay | Admitting: Cardiology

## 2021-10-07 DIAGNOSIS — I2510 Atherosclerotic heart disease of native coronary artery without angina pectoris: Secondary | ICD-10-CM | POA: Diagnosis not present

## 2021-10-07 DIAGNOSIS — I5021 Acute systolic (congestive) heart failure: Secondary | ICD-10-CM

## 2021-10-07 HISTORY — PX: RIGHT/LEFT HEART CATH AND CORONARY ANGIOGRAPHY: CATH118266

## 2021-10-07 LAB — CBC
HCT: 34 % — ABNORMAL LOW (ref 36.0–46.0)
Hemoglobin: 10.9 g/dL — ABNORMAL LOW (ref 12.0–15.0)
MCH: 30.4 pg (ref 26.0–34.0)
MCHC: 32.1 g/dL (ref 30.0–36.0)
MCV: 95 fL (ref 80.0–100.0)
Platelets: 109 10*3/uL — ABNORMAL LOW (ref 150–400)
RBC: 3.58 MIL/uL — ABNORMAL LOW (ref 3.87–5.11)
RDW: 15.2 % (ref 11.5–15.5)
WBC: 5.1 10*3/uL (ref 4.0–10.5)
nRBC: 0 % (ref 0.0–0.2)

## 2021-10-07 LAB — RENAL FUNCTION PANEL
Albumin: 2.5 g/dL — ABNORMAL LOW (ref 3.5–5.0)
Anion gap: 12 (ref 5–15)
BUN: 67 mg/dL — ABNORMAL HIGH (ref 8–23)
CO2: 25 mmol/L (ref 22–32)
Calcium: 7.9 mg/dL — ABNORMAL LOW (ref 8.9–10.3)
Chloride: 101 mmol/L (ref 98–111)
Creatinine, Ser: 9.63 mg/dL — ABNORMAL HIGH (ref 0.44–1.00)
GFR, Estimated: 4 mL/min — ABNORMAL LOW (ref >60)
Glucose, Bld: 102 mg/dL — ABNORMAL HIGH (ref 70–99)
Phosphorus: 5.8 mg/dL — ABNORMAL HIGH (ref 2.5–4.6)
Potassium: 4.6 mmol/L (ref 3.5–5.1)
Sodium: 138 mmol/L (ref 135–145)

## 2021-10-07 LAB — HEPATITIS B SURFACE ANTIBODY, QUANTITATIVE: Hep B S AB Quant (Post): 4.5 m[IU]/mL — ABNORMAL LOW (ref >9.9)

## 2021-10-07 SURGERY — RIGHT/LEFT HEART CATH AND CORONARY ANGIOGRAPHY
Anesthesia: LOCAL

## 2021-10-07 MED ORDER — ISOSORB DINITRATE-HYDRALAZINE 20-37.5 MG PO TABS
0.5000 | ORAL_TABLET | Freq: Three times a day (TID) | ORAL | Status: DC
  Administered 2021-10-07 – 2021-10-08 (×2): 0.5 via ORAL
  Filled 2021-10-07 (×2): qty 1

## 2021-10-07 MED ORDER — LIDOCAINE HCL (PF) 1 % IJ SOLN
INTRAMUSCULAR | Status: DC | PRN
  Administered 2021-10-07: 5 mL

## 2021-10-07 MED ORDER — SODIUM CHLORIDE 0.9% FLUSH
3.0000 mL | Freq: Two times a day (BID) | INTRAVENOUS | Status: DC
  Administered 2021-10-07: 3 mL via INTRAVENOUS

## 2021-10-07 MED ORDER — HEPARIN (PORCINE) IN NACL 1000-0.9 UT/500ML-% IV SOLN
INTRAVENOUS | Status: AC
  Filled 2021-10-07: qty 1000

## 2021-10-07 MED ORDER — LIDOCAINE HCL (PF) 1 % IJ SOLN
INTRAMUSCULAR | Status: AC
  Filled 2021-10-07: qty 30

## 2021-10-07 MED ORDER — SODIUM CHLORIDE 0.9% FLUSH: 3.0000 mL | INTRAVENOUS | Status: DC | PRN

## 2021-10-07 MED ORDER — FENTANYL CITRATE (PF) 100 MCG/2ML IJ SOLN
INTRAMUSCULAR | Status: DC | PRN
  Administered 2021-10-07: 25 ug via INTRAVENOUS

## 2021-10-07 MED ORDER — DIPHENHYDRAMINE HCL 50 MG/ML IJ SOLN
25.0000 mg | Freq: Once | INTRAMUSCULAR | Status: AC
  Administered 2021-10-07: 25 mg via INTRAVENOUS
  Filled 2021-10-07: qty 1

## 2021-10-07 MED ORDER — MIDAZOLAM HCL 2 MG/2ML IJ SOLN
INTRAMUSCULAR | Status: AC
  Filled 2021-10-07: qty 2

## 2021-10-07 MED ORDER — DIPHENHYDRAMINE HCL 50 MG/ML IJ SOLN
INTRAMUSCULAR | Status: AC
  Filled 2021-10-07: qty 1

## 2021-10-07 MED ORDER — SODIUM CHLORIDE 0.9 % IV SOLN: 250.0000 mL | INTRAVENOUS | Status: DC | PRN

## 2021-10-07 MED ORDER — IOHEXOL 350 MG/ML SOLN
INTRAVENOUS | Status: DC | PRN
  Administered 2021-10-07: 40 mL

## 2021-10-07 MED ORDER — FENTANYL CITRATE (PF) 100 MCG/2ML IJ SOLN
INTRAMUSCULAR | Status: AC
  Filled 2021-10-07: qty 2

## 2021-10-07 MED ORDER — MIDAZOLAM HCL 2 MG/2ML IJ SOLN
INTRAMUSCULAR | Status: DC | PRN
  Administered 2021-10-07: 1 mg via INTRAVENOUS

## 2021-10-07 SURGICAL SUPPLY — 13 items
CATH INFINITI 5FR MULTPACK ANG (CATHETERS) IMPLANT
CATH SWAN GANZ 7F STRAIGHT (CATHETERS) IMPLANT
CLOSURE MYNX CONTROL 5F (Vascular Products) IMPLANT
ELECT DEFIB PAD ADLT CADENCE (PAD) IMPLANT
KIT HEART LEFT (KITS) ×1 IMPLANT
PACK CARDIAC CATHETERIZATION (CUSTOM PROCEDURE TRAY) ×1 IMPLANT
SHEATH PINNACLE 5F 10CM (SHEATH) IMPLANT
SHEATH PINNACLE 7F 10CM (SHEATH) IMPLANT
SHEATH PROBE COVER 6X72 (BAG) IMPLANT
TRANSDUCER W/STOPCOCK (MISCELLANEOUS) ×1 IMPLANT
TUBING CIL FLEX 10 FLL-RA (TUBING) ×1 IMPLANT
WIRE EMERALD 3MM-J .025X260CM (WIRE) IMPLANT
WIRE EMERALD 3MM-J .035X150CM (WIRE) IMPLANT

## 2021-10-07 NOTE — Progress Notes (Signed)
Mobility Specialist - Progress Note   10/07/21 1459  Mobility  Activity Ambulated with assistance in hallway  Activity Response Tolerated well  Distance Ambulated (ft) 200 ft  $Mobility charge 1 Mobility  Level of Assistance Standby assist, set-up cues, supervision of patient - no hands on  Assistive Device None  Mobility Referral Yes   Pt was received in bed and agreeable to mobility. Pt c/o leg weakness during ambulation. Pt was returned to EOB with all needs met.   Larey Seat

## 2021-10-07 NOTE — Telephone Encounter (Signed)
Pharmacy Patient Advocate Encounter  Insurance verification completed.    The patient is insured through [employer] Medicare Part D   The patient is currently admitted and ran test claims for the following: isosorbide-hydralazine (Bidil).  Copays and coinsurance results were relayed to Inpatient clinical team. 

## 2021-10-07 NOTE — Research (Cosign Needed)
   [place] Lung ultrasound validation trial Informed Consent   Subject Name: Barbara Crawford  Subject met inclusion and exclusion criteria.  The informed consent form, study requirements and expectations were reviewed with the subject and questions and concerns were addressed prior to the signing of the consent form.  The subject verbalized understanding of the trail requirements.  The subject agreed to participate in the [facility] trial and signed the informed consent.  The informed consent was obtained prior to performance of any protocol-specific procedures for the subject.  A copy of the signed informed consent was given to the subject and a copy was placed in the subject's medical record.  Both Validation lung ultrasounds completed.  [name] 10/07/2021, 2:07 PM

## 2021-10-07 NOTE — Progress Notes (Signed)
[facility] Progress Note  Subjective: went for LHC today due to low EF and does not have sig CAD.   Vitals:   10/07/21 1000 10/07/21 1038 10/07/21 1200 10/07/21 1347  BP: 125/78 128/78 116/68 114/66  Pulse:  83 82 81  Resp:  14  (!) 25  Temp:  97.8 F (36.6 C)    TempSrc:  Oral    SpO2:  94% 96% 96%  Weight:      Height:        Exam: Gen alert, no distress No jvd or bruits Chest clear bilat to bases RRR no MRG Abd soft ntnd no mass or ascites +bs Ext bilat ankle edema is resolved Neuro is alert, Ox 3 , nf    LUA AVG +bruit   Home meds include - aspirin, metoprolol 25 bid, MV    OP HD: [facility] [facility] TTS   53kg  LUA AVF  Hep none  350/ 600     Assessment/ Plan: Resp distress/ pulm edema - due to vol overload most likely, suspect she has been losing body wt. She is compliant w/ her HD. Got 2.5 L off w/ HD yesterday and SOB mostly resolved. Lower vol more today/tonight w HD if possible.  CHF - EF 25%, down from prior 45%. For LHC today.  ESRD - on HD TTS as above. HD today.  HTN - BP's are low, will lower dose of metoprolol for now as we will need BP up tomorrow to get more volume off.  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 Inpatient medications:  aspirin  81 mg Oral Daily   heparin  5,000 Units Subcutaneous Q8H   isosorbide-hydrALAZINE  0.5 tablet Oral TID   metoprolol tartrate  12.5 mg Oral BID   multivitamin with minerals  1 tablet Oral Daily   sodium chloride flush  3 mL Intravenous Q12H   sodium chloride flush  3 mL Intravenous Q12H    sodium chloride     famotidine (PEPCID) IV     sodium chloride, acetaminophen,  heparin, sodium chloride flush

## 2021-10-07 NOTE — Progress Notes (Signed)
Daily Progress Note Intern Pager: [phone]  Patient name: Barbara Crawford Medical record number: [id number] Date of birth: 1955/02/17 Age: 66 y.o. Gender: female  Primary Care Provider: Gerald Leitz Consultants: Cardiology, nephrology Code Status: Full  Pt Overview and Major Events to Date:  10/4: Admitted to FMTS, symptoms improved after HD.  Troponin elevated, EF <25%, cardiology recommended cath 10/5: Patient underwent heart cath  Assessment and Plan: Barbara Crawford is a 66 year old female presented with shortness of breath.  Pulmonary edema was present likely secondary to ESRD without fluid restriction and worsening HFrEF.  Pulmonary edema is resolved after HD, however patient was found to have ejection fraction of 25%.  Pertinent PMH/PSH includes: ESRD on HD TTS, HTN, HFrEF, anxiety.   CHF (congestive heart failure) (HCC) High-sensitivity troponin elevated 217 yesterday, trended down likely secondary to demand ischemia.  No STEMI on EKG. Cardiology was consulted, and they recommended heart cath for further evaluation after echo revealed LVEF of 25%.  No room for ACE/ARB, MRA, SGLT2 due to renal function. - Heart cath today (10/5) - Follow-up on results - Cardiology consulted, appreciate further recommendations - I/Os and daily weights  Pulmonary edema Breathing comfortably on room air.  Edema improved after hemodialysis.  Likely secondary to worsening congestive heart failure and ESRD requiring hemodialysis.  Nephrology was consulted, and recommended continuing dialysis regimen as scheduled.  No need for emergent dialysis at this time. - Nephrology consulted, appreciate continued recs - HD TTS - Cardiac monitoring - O2 as needed, can escalate to BiPAP - Renal diet with fluid restriction  Anxiety Anxiety is improved now that SOB has resolved.  This can be followed as outpatient.  Tachycardia HR <100 on metoprolol. - Continue metoprolol -  Cardiology consulted, appreciate recs  HTN (hypertension) Stable and WNL. Decreased slightly from admission after HD.  -Restarted home metoprolol 25 mg BID -Vitals per floor    FEN/GI: Renal, fluid restriction PPx: Subcu heparin Dispo:Home pending clinical improvement .   Subjective:  Patient was off the floor this morning during rounding.  We will circle back after patient comes back from Cath Lab.  Objective: Temp:  [98 F (36.7 C)-98.9 F (37.2 C)] 98 F (36.7 C) (10/05 0336) Pulse Rate:  [84-124] 89 (10/05 0844) Resp:  [18-29] 29 (10/05 0844) BP: (105-162)/(57-97) 124/77 (10/05 0844) SpO2:  [93 %-100 %] 100 % (10/05 0844) Weight:  [51.1 kg] 51.1 kg (10/05 0645) Physical Exam: Deferred until patient comes back from Cath Lab.  Laboratory: Most recent CBC Lab Results  Component Value Date   WBC 5.1 10/07/2021   HGB 10.9 (L) 10/07/2021   HCT 34.0 (L) 10/07/2021   MCV 95.0 10/07/2021   PLT 109 (L) 10/07/2021   Most recent BMP    Latest Ref Rng & Units 10/07/2021    5:56 AM  BMP  Glucose 70 - 99 mg/dL 102   BUN 8 - 23 mg/dL 67   Creatinine 0.44 - 1.00 mg/dL 9.63   Sodium 135 - 145 mmol/L 138   Potassium 3.5 - 5.1 mmol/L 4.6   Chloride 98 - 111 mmol/L 101   CO2 22 - 32 mmol/L 25   Calcium 8.9 - 10.3 mg/dL 7.9     Other pertinent labs:  Troponin high-sensitivity: 217>>> 205  Imaging/Diagnostic Tests: Echo 10/06/2021 LV ejection fraction of 25%, severely decreased from 45% 1 year ago.  Leslie Dales, DO 10/07/2021, 9:30 AM  PGY-1, [place] Intern pager:  [phone], text pages welcome Secure chat group [place]

## 2021-10-07 NOTE — Plan of Care (Signed)
  Problem: Clinical Measurements: Goal: Respiratory complications will improve Outcome: Progressing   Problem: Pain Managment: Goal: General experience of comfort will improve Outcome: Progressing   Problem: Safety: Goal: Ability to remain free from injury will improve Outcome: Progressing   Problem: Skin Integrity: Goal: Risk for impaired skin integrity will decrease Outcome: Progressing   

## 2021-10-07 NOTE — Progress Notes (Signed)
Pt receives out-pt HD at [facility] in [place] on TTS. Pt arrives at 11:40 for 11:55 chair time. Will assist as needed.   Melven Sartorius Renal Navigator [phone]

## 2021-10-07 NOTE — Progress Notes (Signed)
Rounding Note    Patient Name: Barbara Crawford Date of Encounter: 10/07/2021  [place] Cardiologist: new  Subjective   Did well with cath.  Slept through the procedure.   Inpatient Medications    Scheduled Meds:  aspirin  81 mg Oral Daily   heparin  5,000 Units Subcutaneous Q8H   isosorbide-hydrALAZINE  0.5 tablet Oral TID   metoprolol tartrate  12.5 mg Oral BID   multivitamin with minerals  1 tablet Oral Daily   sodium chloride flush  3 mL Intravenous Q12H   sodium chloride flush  3 mL Intravenous Q12H   Continuous Infusions:  sodium chloride     famotidine (PEPCID) IV     PRN Meds: sodium chloride, acetaminophen, heparin, sodium chloride flush   Vital Signs    Vitals:   10/07/21 0844 10/07/21 0908 10/07/21 1000 10/07/21 1038  BP: 124/77 117/77 125/78 128/78  Pulse: 89 82  83  Resp: (!) 29 13    Temp:  97.7 F (36.5 C)  97.8 F (36.6 C)  TempSrc:  Oral  Oral  SpO2: 100% 96%  94%  Weight:      Height:        Intake/Output Summary (Last 24 hours) at 10/07/2021 1101 Last data filed at 10/07/2021 0700 Gross per 24 hour  Intake 180 ml  Output --  Net 180 ml      10/07/2021    6:45 AM 10/06/2021    3:35 AM 10/06/2021    3:24 AM  Last 3 Weights  Weight (lbs) 112 lb 10.5 oz 111 lb 4.8 oz 113 lb  Weight (kg) 51.1 kg 50.485 kg 51.256 kg      Telemetry    NSR - Personally Reviewed  ECG      Physical Exam   GEN: No acute distress.   Neck: No JVD Cardiac: RRR, no murmurs, rubs, or gallops.  Respiratory: Clear to auscultation bilaterally. GI: Soft, nontender, non-distended  MS: No edema; No deformity. Right groin without hematoma.  Mynx sticker in place Neuro:  Nonfocal  Psych: Normal affect   Labs    High Sensitivity Troponin:   Recent Labs  Lab 10/05/21 0744 10/05/21 0952 10/06/21 0918 10/06/21 1306  TROPONINIHS 64* 77* 217* 205*     Chemistry Recent Labs  Lab 10/05/21 0744 10/05/21 0834 10/06/21 0440  10/07/21 0556  NA 143 143 136 138  K 4.2 3.7 4.1 4.6  CL 103  --  99 101  CO2 22  --  26 25  GLUCOSE 172*  --  101* 102*  BUN 55*  --  40* 67*  CREATININE 9.25*  --  7.22* 9.63*  CALCIUM 8.8*  --  8.0* 7.9*  MG 2.2  --   --   --   PROT 8.1  --   --   --   ALBUMIN 3.0*  --  2.6* 2.5*  AST 34  --   --   --   ALT 24  --   --   --   ALKPHOS 72  --   --   --   BILITOT 0.6  --   --   --   GFRNONAA 4*  --  6* 4*  ANIONGAP 18*  --  11 12    Lipids No results for input(s): "CHOL", "TRIG", "HDL", "LABVLDL", "LDLCALC", "CHOLHDL" in the last 168 hours.  Hematology Recent Labs  Lab 10/05/21 0744 10/05/21 0834 10/06/21 0440 10/07/21 0556  WBC 6.9  --  8.3 5.1  RBC 4.25  --  3.82* 3.58*  HGB 12.8 14.6 11.3* 10.9*  HCT 42.7 43.0 36.9 34.0*  MCV 100.5*  --  96.6 95.0  MCH 30.1  --  29.6 30.4  MCHC 30.0  --  30.6 32.1  RDW 15.9*  --  15.6* 15.2  PLT 158  --  108* 109*   Thyroid No results for input(s): "TSH", "FREET4" in the last 168 hours.  BNP Recent Labs  Lab 10/05/21 0735  BNP 2,257.2*    DDimer No results for input(s): "DDIMER" in the last 168 hours.   Radiology    CARDIAC CATHETERIZATION  Result Date: 10/07/2021   Prox LAD to Mid LAD lesion is 20% stenosed.   2nd Diag lesion is 80% stenosed.   Prox RCA to Mid RCA lesion is 10% stenosed.   LV end diastolic pressure is mildly elevated.   Hemodynamic findings consistent with mild pulmonary hypertension.   There is no aortic valve stenosis. 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 Otherwise minimal CAD Mildly elevated LV filling pressures. PCWP 23/27 with mean 19 mm Hg Mild pulmonary HTN. PAP 41/18 mean 28 mm Hg High cardiac output with index 6.41. Plan: medical management   ECHOCARDIOGRAM COMPLETE  Result Date: 10/06/2021    ECHOCARDIOGRAM REPORT   Patient Name:   Barbara Crawford Date of Exam: 10/06/2021 Medical Rec #:  [id number]             Height:       63.0 in Accession #:     [id number]            Weight:       111.3 lb Date of Birth:  1955/11/19            BSA:          1.507 m Patient Age:    61 years              BP:           102/70 mmHg Patient Gender: F                     HR:           86 bpm. Exam Location:  Inpatient Procedure: 2D Echo, Cardiac Doppler, Color Doppler, Intracardiac Opacification            Agent and 3D Echo Indications:    Dyspnea R06.00  History:        Patient has prior history of Echocardiogram examinations, most                 recent 08/17/2020. Signs/Symptoms:Dyspnea; Risk                 Factors:Hypertension.  Sonographer:    Bernadene Person RDCS Referring Phys: [id number] [place]  1. Left ventricular ejection fraction, by estimation, is 25%. The left ventricle has severely decreased function. The left ventricle demonstrates global hypokinesis. Prominent trabeculation toward the LV apex raises concern for LV noncompaction. There is mild concentric left ventricular hypertrophy. Left ventricular diastolic parameters are consistent with Grade I diastolic dysfunction (impaired relaxation).  2. Right ventricular systolic function is mildly reduced. The right ventricular size is normal. There is moderately elevated pulmonary artery systolic pressure. The estimated right ventricular systolic pressure is 02.7 mmHg.  3. Left atrial size was mildly dilated.  4. Right atrial size was mildly dilated.  5. The mitral valve  is normal in structure. Mild mitral valve regurgitation. No evidence of mitral stenosis.  6. Tricuspid valve regurgitation is moderate.  7. The aortic valve is tricuspid. There is mild calcification of the aortic valve. Aortic valve regurgitation is not visualized. No aortic stenosis is present.  8. The inferior vena cava is dilated in size with <50% respiratory variability, suggesting right atrial pressure of 15 mmHg. FINDINGS  Left Ventricle: Left ventricular ejection fraction, by estimation, is 25%. The left ventricle has  severely decreased function. The left ventricle demonstrates global hypokinesis. The left ventricular internal cavity size was normal in size. There is mild concentric left ventricular hypertrophy. Left ventricular diastolic parameters are consistent with Grade I diastolic dysfunction (impaired relaxation). Right Ventricle: The right ventricular size is normal. No increase in right ventricular wall thickness. Right ventricular systolic function is mildly reduced. There is moderately elevated pulmonary artery systolic pressure. The tricuspid regurgitant velocity is 3.05 m/s, and with an assumed right atrial pressure of 15 mmHg, the estimated right ventricular systolic pressure is 27.0 mmHg. Left Atrium: Left atrial size was mildly dilated. Right Atrium: Right atrial size was mildly dilated. Pericardium: There is no evidence of pericardial effusion. Mitral Valve: The mitral valve is normal in structure. Mild mitral valve regurgitation. No evidence of mitral valve stenosis. Tricuspid Valve: The tricuspid valve is normal in structure. Tricuspid valve regurgitation is moderate. Aortic Valve: The aortic valve is tricuspid. There is mild calcification of the aortic valve. Aortic valve regurgitation is not visualized. No aortic stenosis is present. Pulmonic Valve: The pulmonic valve was normal in structure. Pulmonic valve regurgitation is mild. Aorta: The aortic root is normal in size and structure. Venous: The inferior vena cava is dilated in size with less than 50% respiratory variability, suggesting right atrial pressure of 15 mmHg. IAS/Shunts: No atrial level shunt detected by color flow Doppler.  LEFT VENTRICLE PLAX 2D LVIDd:         5.30 cm      Diastology LVIDs:         4.10 cm      LV e' medial:    5.81 cm/s LV PW:         0.80 cm      LV E/e' medial:  18.8 LV IVS:        0.80 cm      LV e' lateral:   8.31 cm/s LVOT diam:     1.90 cm      LV E/e' lateral: 13.1 LV SV:         52 LV SV Index:   34 LVOT Area:     2.84  cm                              3D Volume EF: LV Volumes (MOD)            3D EF:        32 % LV vol d, MOD A2C: 132.0 ml LV EDV:       176 ml LV vol d, MOD A4C: 126.0 ml LV ESV:       120 ml LV vol s, MOD A2C: 85.5 ml  LV SV:        56 ml LV vol s, MOD A4C: 84.5 ml LV SV MOD A2C:     46.5 ml LV SV MOD A4C:     126.0 ml LV SV MOD BP:      45.4 ml  RIGHT VENTRICLE RV S prime:     10.40 cm/s TAPSE (M-mode): 1.9 cm LEFT ATRIUM             Index        RIGHT ATRIUM           Index LA diam:        4.10 cm 2.72 cm/m   RA Area:     19.10 cm LA Vol (A2C):   50.3 ml 33.37 ml/m  RA Volume:   47.60 ml  31.58 ml/m LA Vol (A4C):   53.7 ml 35.63 ml/m LA Biplane Vol: 57.4 ml 38.08 ml/m  AORTIC VALVE             PULMONIC VALVE LVOT Vmax:   115.00 cm/s PR End Diast Vel: 5.86 msec LVOT Vmean:  75.800 cm/s LVOT VTI:    0.183 m  AORTA Ao Root diam: 3.00 cm Ao Asc diam:  3.20 cm MITRAL VALVE                  TRICUSPID VALVE MV Area (PHT): 5.66 cm       TR Peak grad:   37.2 mmHg MV Decel Time: 134 msec       TR Vmax:        305.00 cm/s MR Peak grad:    90.1 mmHg MR Mean grad:    52.5 mmHg    SHUNTS MR Vmax:         474.50 cm/s  Systemic VTI:  0.18 m MR Vmean:        340.5 cm/s   Systemic Diam: 1.90 cm MR PISA:         0.57 cm MR PISA Eff ROA: 5 mm MR PISA Radius:  0.30 cm MV E velocity: 109.00 cm/s MV A velocity: 114.00 cm/s MV E/A ratio:  0.96 Dalton McleanMD Electronically signed by Franki Monte Signature Date/Time: 10/06/2021/1:51:12 PM    Final     Cardiac Studies   EF 25%; no significant CAD to explain low EF  Patient Profile     66 y.o. female with HFrEF, ESRD  Assessment & Plan    Medical Rx for HFrEF.  WIll start low dose BiDil.  BP should tolerate.  Already on low dose metoprolol.  Consider ARB if BP is elevated after BiDil titration.      For questions or updates, please contact [place] Please consult [email or web address] for contact info under        Signed, Larae Grooms, MD   10/07/2021, 11:01 AM

## 2021-10-07 NOTE — Interval H&P Note (Signed)
History and Physical Interval Note:  10/07/2021 7:17 AM  Barbara Crawford  has presented today for surgery, with the diagnosis of hf.  The various methods of treatment have been discussed with the patient and family. After consideration of risks, benefits and other options for treatment, the patient has consented to  Procedure(s): RIGHT/LEFT HEART CATH AND CORONARY ANGIOGRAPHY (N/A) as a surgical intervention.  The patient's history has been reviewed, patient examined, no change in status, stable for surgery.  I have reviewed the patient's chart and labs.  Questions were answered to the patient's satisfaction.   Cath Lab Visit (complete for each Cath Lab visit)  Clinical Evaluation Leading to the Procedure:   ACS: Yes.    Non-ACS:    Anginal Classification: CCS III  Anti-ischemic medical therapy: Maximal Therapy (2 or more classes of medications)  Non-Invasive Test Results: High-risk stress test findings: cardiac mortality >3%/year  Prior CABG: No previous CABG        Collier Salina [facility] 10/07/2021 7:17 AM

## 2021-10-07 NOTE — TOC Benefit Eligibility Note (Signed)
Patient Teacher, English as a foreign language completed.    The patient is currently admitted and upon discharge could be taking isosorbide-hydralazine (Bidil) 20-37.5 mg.  The current 30 day co-pay is $47.00.   The patient is insured through [place], [place] Patient Advocate Specialist [place] Patient Advocate Team Direct Number: [phone]  Fax: [phone]

## 2021-10-08 ENCOUNTER — Other Ambulatory Visit (HOSPITAL_COMMUNITY): Payer: Self-pay

## 2021-10-08 ENCOUNTER — Other Ambulatory Visit: Payer: Self-pay | Admitting: Student

## 2021-10-08 LAB — POCT I-STAT EG7
Acid-Base Excess: 1 mmol/L (ref 0.0–2.0)
Acid-Base Excess: 1 mmol/L (ref 0.0–2.0)
Bicarbonate: 26.1 mmol/L (ref 20.0–28.0)
Bicarbonate: 26.5 mmol/L (ref 20.0–28.0)
Calcium, Ion: 1.01 mmol/L — ABNORMAL LOW (ref 1.15–1.40)
Calcium, Ion: 1.02 mmol/L — ABNORMAL LOW (ref 1.15–1.40)
HCT: 34 % — ABNORMAL LOW (ref 36.0–46.0)
HCT: 34 % — ABNORMAL LOW (ref 36.0–46.0)
Hemoglobin: 11.6 g/dL — ABNORMAL LOW (ref 12.0–15.0)
Hemoglobin: 11.6 g/dL — ABNORMAL LOW (ref 12.0–15.0)
O2 Saturation: 84 %
O2 Saturation: 85 %
Potassium: 3.9 mmol/L (ref 3.5–5.1)
Potassium: 4 mmol/L (ref 3.5–5.1)
Sodium: 138 mmol/L (ref 135–145)
Sodium: 138 mmol/L (ref 135–145)
TCO2: 27 mmol/L (ref 22–32)
TCO2: 28 mmol/L (ref 22–32)
pCO2, Ven: 43.1 mmHg — ABNORMAL LOW (ref 44–60)
pCO2, Ven: 43.8 mmHg — ABNORMAL LOW (ref 44–60)
pH, Ven: 7.389 (ref 7.25–7.43)
pH, Ven: 7.39 (ref 7.25–7.43)
pO2, Ven: 50 mmHg — ABNORMAL HIGH (ref 32–45)
pO2, Ven: 50 mmHg — ABNORMAL HIGH (ref 32–45)

## 2021-10-08 LAB — BASIC METABOLIC PANEL
Anion gap: 14 (ref 5–15)
BUN: 85 mg/dL — ABNORMAL HIGH (ref 8–23)
CO2: 22 mmol/L (ref 22–32)
Calcium: 7.6 mg/dL — ABNORMAL LOW (ref 8.9–10.3)
Chloride: 101 mmol/L (ref 98–111)
Creatinine, Ser: 10.86 mg/dL — ABNORMAL HIGH (ref 0.44–1.00)
GFR, Estimated: 4 mL/min — ABNORMAL LOW (ref >60)
Glucose, Bld: 114 mg/dL — ABNORMAL HIGH (ref 70–99)
Potassium: 4.1 mmol/L (ref 3.5–5.1)
Sodium: 137 mmol/L (ref 135–145)

## 2021-10-08 LAB — POCT I-STAT 7, (LYTES, BLD GAS, ICA,H+H)
Acid-Base Excess: 1 mmol/L (ref 0.0–2.0)
Bicarbonate: 27 mmol/L (ref 20.0–28.0)
Calcium, Ion: 1.02 mmol/L — ABNORMAL LOW (ref 1.15–1.40)
HCT: 34 % — ABNORMAL LOW (ref 36.0–46.0)
Hemoglobin: 11.6 g/dL — ABNORMAL LOW (ref 12.0–15.0)
O2 Saturation: 99 %
Potassium: 3.9 mmol/L (ref 3.5–5.1)
Sodium: 137 mmol/L (ref 135–145)
TCO2: 28 mmol/L (ref 22–32)
pCO2 arterial: 45.2 mmHg (ref 32–48)
pH, Arterial: 7.384 (ref 7.35–7.45)
pO2, Arterial: 165 mmHg — ABNORMAL HIGH (ref 83–108)

## 2021-10-08 MED ORDER — ISOSORB DINITRATE-HYDRALAZINE 20-37.5 MG PO TABS
0.5000 | ORAL_TABLET | Freq: Three times a day (TID) | ORAL | 0 refills | Status: DC
  Filled 2021-10-08: qty 45, 30d supply, fill #0

## 2021-10-08 MED ORDER — METOPROLOL SUCCINATE ER 25 MG PO TB24
25.0000 mg | ORAL_TABLET | Freq: Every day | ORAL | 0 refills | Status: DC
  Filled 2021-10-08: qty 30, 30d supply, fill #0

## 2021-10-08 MED ORDER — ONDANSETRON 4 MG PO TBDP
4.0000 mg | ORAL_TABLET | Freq: Once | ORAL | Status: DC
  Filled 2021-10-08: qty 1

## 2021-10-08 MED FILL — Heparin Sod (Porcine)-NaCl IV Soln 1000 Unit/500ML-0.9%: INTRAVENOUS | Qty: 500 | Status: AC

## 2021-10-08 NOTE — Progress Notes (Addendum)
Received patient in bed to unit.  Alert and oriented.  Informed consent signed and in chart.   Treatment initiated: 0621 Treatment completed: 1006  Patient tolerated well.  Transported back to the room  Alert, without acute distress.  Hand-off given to patient's nurse.   Access used: Fistula (L) Access issues: N/A  Total UF removed: 1.5L Medication(s) given: N/A   10/08/21 1008  Vitals  Temp 98 F (36.7 C)  Temp Source Oral  BP (!) 108/55  MAP (mmHg) 72  BP Location Right Arm  BP Method Automatic  Pulse Rate 91  Resp 20  Oxygen Therapy  SpO2 100 %  O2 Device Non-rebreather Mask  Post Treatment  Dialyzer Clearance Lightly streaked  Duration of HD Treatment -hour(s) 3.5 hour(s)  Hemodialysis Intake (mL) 0 mL  Liters Processed 73.5  Fluid Removed 1500 mL  Tolerated HD Treatment Yes    Post HD weight: 50.7KG   [facility]

## 2021-10-08 NOTE — Progress Notes (Signed)
[facility] Progress Note  Subjective: went for LHC today due to low EF and does not have sig CAD.   Vitals:   10/08/21 1008 10/08/21 1035 10/08/21 1206 10/08/21 1444  BP: (!) 108/55 (!) 108/53 (!) 108/53 118/62  Pulse: 91 83  84  Resp: 20 18  (!) 21  Temp: 98 F (36.7 C) 98.1 F (36.7 C)  98.9 F (37.2 C)  TempSrc: Oral Oral  Oral  SpO2: 100% 98%  100%  Weight:      Height:        Exam: Gen alert, no distress No jvd or bruits Chest clear bilat to bases RRR no MRG Abd soft ntnd no mass or ascites +bs Ext bilat ankle edema is resolved Neuro is alert, Ox 3 , nf    LUA AVG +bruit   Home meds include - aspirin, metoprolol 25 bid, MV    OP HD: [facility] [address] / [facility] TTS   53kg  LUA AVF  Hep none  350/ 600     Assessment/ Plan: Resp distress/ pulm edema - due to vol overload most likely, suspect she has been losing body wt. She is compliant w/ her HD. Got 2.5 L off w/ HD yesterday and SOB mostly resolved. 1.5 L removed today. Dry wt should be lower around 50.5kg.  CHF - EF 25%, down from prior 45%. Heart cath showed no sig CAD.  ESRD - on HD TTS as above. Had HD here Tuesday and again this am (rollover from Thursday). Will adjust dry wt down as above.  HTN - BP's are lower here, metoprolol was lowered here to 12.5 bid.  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 Inpatient medications:  aspirin  81 mg Oral Daily   heparin  5,000 Units Subcutaneous Q8H   isosorbide-hydrALAZINE  0.5 tablet Oral TID   metoprolol tartrate  12.5 mg Oral BID   multivitamin with minerals  1 tablet Oral  Daily   ondansetron  4 mg Oral Once   sodium chloride flush  3 mL Intravenous Q12H   sodium chloride flush  3 mL Intravenous Q12H    sodium chloride     famotidine (PEPCID) IV     sodium chloride, acetaminophen, heparin, sodium chloride flush

## 2021-10-08 NOTE — Progress Notes (Addendum)
     Daily Progress Note Intern Pager: [phone]  Patient name: Barbara Crawford Medical record number: [id number] Date of birth: Feb 23, 1955 Age: 66 y.o. Gender: female  Primary Care Provider: Gerald Leitz Consultants: Cardiology, nephrology Code Status: Full  Pt Overview and Major Events to Date:  10/4: Admitted to FMTS, symptoms improved after HD.  Troponin elevated, EF less than 25%, cardiology recommended cath 10/5: Patient underwent heart cath, no stent 10/6: Patient medically stable for discharge  Assessment and Plan: Barbara Crawford is a 66 year old female who presented with shortness of breath.  Was found to have pulmonary edema on x-ray likely secondary to ESRD, worsening HFrEF.  Pulmonary edema resolved after hemodialysis.  However, patient was found to have LVEF of 25%, CHF treatment complicated by ESRD.  Placed on BiDil per cardiology, can consider ARB as outpatient.  Patient remains asymptomatic, no need for further PT/OT.  CHF (congestive heart failure) [facility]) Patient underwent heart cath yesterday, no stent was needed.  Patient was placed on BiDil per cardiology, with consideration of ARB if blood pressure remains elevated.  ESRD, unable to start MRA no SGLT2. PT/OT signed off, no need for further inpatient or outpatient rehab. - Patient home with BiDil, can consider ARB outpatient - Follow-up with cardiology outpatient   Tachycardia HR <100 on metoprolol.  No further recs per cardiology. - Continue metoprolol  HTN (hypertension) Stable and WNL. Decreased slightly from admission after HD.  -Metoprolol 12.5 mg twice daily -Vitals per floor   FEN/GI: Renal, fluid restriction PPx: Subcu heparin Dispo:Home today.   Subjective:  Patient hemodialysis during rounds, we will circle back.  Objective: Temp:  [97.6 F (36.4 C)-98.2 F (36.8 C)] 98 F (36.7 C) (10/06 0605) Pulse Rate:  [78-88] 85 (10/06 0846) Resp:  [13-26] 26 (10/06  0846) BP: (89-128)/(55-78) 111/55 (10/06 0846) SpO2:  [94 %-100 %] 100 % (10/06 0846) Weight:  [55.7 kg] 55.7 kg (10/06 0737) Physical Exam: Deferred.  Patient in hemodialysis during rounds, will circle back after rounding. Laboratory: Most recent CBC Lab Results  Component Value Date   WBC 5.1 10/07/2021   HGB 10.9 (L) 10/07/2021   HCT 34.0 (L) 10/07/2021   MCV 95.0 10/07/2021   PLT 109 (L) 10/07/2021   Most recent BMP    Latest Ref Rng & Units 10/08/2021    1:45 AM  BMP  Glucose 70 - 99 mg/dL 114   BUN 8 - 23 mg/dL 85   Creatinine 0.44 - 1.00 mg/dL 10.86   Sodium 135 - 145 mmol/L 137   Potassium 3.5 - 5.1 mmol/L 4.1   Chloride 98 - 111 mmol/L 101   CO2 22 - 32 mmol/L 22   Calcium 8.9 - 10.3 mg/dL 7.6     Leslie Dales, DO 10/08/2021, 9:01 AM  PGY-1, [place] Intern pager: [phone], text pages welcome Secure chat group [place]

## 2021-10-08 NOTE — Care Management (Signed)
  Transition of Care [facility]) Screening Note   Patient Details  Name: Barbara Crawford Date of Birth: 17-Sep-1955   Transition of Care [facility]) CM/SW Contact:    Bethena Roys, RN Phone Number: 10/08/2021, 1:04 PM    Transition of Care Department [facility]) has reviewed the patient. Case Manager received a consult for patient is not sure her insurance accepts [facility], can you please help her? If not, we can refer her to another cardiology group. Case Manager spoke with the patient regarding this consult: patient will call [facility] Medicare to make sure the office is in network. No further needs from Case Manager at this time.

## 2021-10-08 NOTE — Progress Notes (Signed)
Rounding Note    Patient Name: Barbara Crawford Date of Encounter: 10/08/2021  [place] Cardiologist: Larae Grooms, MD   Subjective   Feels well.  Wants to go home.   Inpatient Medications    Scheduled Meds:  aspirin  81 mg Oral Daily   heparin  5,000 Units Subcutaneous Q8H   isosorbide-hydrALAZINE  0.5 tablet Oral TID   metoprolol tartrate  12.5 mg Oral BID   multivitamin with minerals  1 tablet Oral Daily   ondansetron  4 mg Oral Once   sodium chloride flush  3 mL Intravenous Q12H   sodium chloride flush  3 mL Intravenous Q12H   Continuous Infusions:  sodium chloride     famotidine (PEPCID) IV     PRN Meds: sodium chloride, acetaminophen, heparin, sodium chloride flush   Vital Signs    Vitals:   10/08/21 0915 10/08/21 0930 10/08/21 1008 10/08/21 1035  BP:  (!) 103/54 (!) 108/55 (!) 108/53  Pulse: 84 (!) 57 91 83  Resp: (!) 22 (!) 30 20 18   Temp:   98 F (36.7 C) 98.1 F (36.7 C)  TempSrc:   Oral Oral  SpO2: 100% 100% 100% 98%  Weight:      Height:        Intake/Output Summary (Last 24 hours) at 10/08/2021 1128 Last data filed at 10/08/2021 1008 Gross per 24 hour  Intake 120 ml  Output 1500 ml  Net -1380 ml      10/08/2021    6:05 AM 10/07/2021    6:45 AM 10/06/2021    3:35 AM  Last 3 Weights  Weight (lbs) 122 lb 12.7 oz 112 lb 10.5 oz 111 lb 4.8 oz  Weight (kg) 55.7 kg 51.1 kg 50.485 kg      Telemetry    NSR - Personally Reviewed  ECG      Physical Exam   GEN: No acute distress.  Seen in dialysis Neck: No JVD Cardiac: RRR, no murmurs, rubs, or gallops.  Respiratory: Clear to auscultation bilaterally. GI: Soft, nontender, non-distended  MS: No edema; No deformity. Neuro:  Nonfocal  Psych: Normal affect   Labs    High Sensitivity Troponin:   Recent Labs  Lab 10/05/21 0744 10/05/21 0952 10/06/21 0918 10/06/21 1306  TROPONINIHS 64* 77* 217* 205*     Chemistry Recent Labs  Lab 10/05/21 0744  10/05/21 0834 10/06/21 0440 10/07/21 0556 10/08/21 0145  NA 143   < > 136 138 137  K 4.2   < > 4.1 4.6 4.1  CL 103  --  99 101 101  CO2 22  --  26 25 22   GLUCOSE 172*  --  101* 102* 114*  BUN 55*  --  40* 67* 85*  CREATININE 9.25*  --  7.22* 9.63* 10.86*  CALCIUM 8.8*  --  8.0* 7.9* 7.6*  MG 2.2  --   --   --   --   PROT 8.1  --   --   --   --   ALBUMIN 3.0*  --  2.6* 2.5*  --   AST 34  --   --   --   --   ALT 24  --   --   --   --   ALKPHOS 72  --   --   --   --   BILITOT 0.6  --   --   --   --   GFRNONAA 4*  --  6*  4* 4*  ANIONGAP 18*  --  11 12 14    < > = values in this interval not displayed.    Lipids No results for input(s): "CHOL", "TRIG", "HDL", "LABVLDL", "LDLCALC", "CHOLHDL" in the last 168 hours.  Hematology Recent Labs  Lab 10/05/21 0744 10/05/21 0834 10/06/21 0440 10/07/21 0556  WBC 6.9  --  8.3 5.1  RBC 4.25  --  3.82* 3.58*  HGB 12.8 14.6 11.3* 10.9*  HCT 42.7 43.0 36.9 34.0*  MCV 100.5*  --  96.6 95.0  MCH 30.1  --  29.6 30.4  MCHC 30.0  --  30.6 32.1  RDW 15.9*  --  15.6* 15.2  PLT 158  --  108* 109*   Thyroid No results for input(s): "TSH", "FREET4" in the last 168 hours.  BNP Recent Labs  Lab 10/05/21 0735  BNP 2,257.2*    DDimer No results for input(s): "DDIMER" in the last 168 hours.   Radiology    CARDIAC CATHETERIZATION  Result Date: 10/07/2021   Prox LAD to Mid LAD lesion is 20% stenosed.   2nd Diag lesion is 80% stenosed.   Prox RCA to Mid RCA lesion is 10% stenosed.   LV end diastolic pressure is mildly elevated.   Hemodynamic findings consistent with mild pulmonary hypertension.   There is no aortic valve stenosis. 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 Otherwise minimal CAD Mildly elevated LV filling pressures. PCWP 23/27 with mean 19 mm Hg Mild pulmonary HTN. PAP 41/18 mean 28 mm Hg High cardiac output with index 6.41. Plan: medical management   ECHOCARDIOGRAM COMPLETE  Result Date:  10/06/2021    ECHOCARDIOGRAM REPORT   Patient Name:   Barbara Crawford Date of Exam: 10/06/2021 Medical Rec #:  [id number]             Height:       63.0 in Accession #:    [id number]            Weight:       111.3 lb Date of Birth:  1955/05/23            BSA:          1.507 m Patient Age:    66 years              BP:           102/70 mmHg Patient Gender: F                     HR:           86 bpm. Exam Location:  Inpatient Procedure: 2D Echo, Cardiac Doppler, Color Doppler, Intracardiac Opacification            Agent and 3D Echo Indications:    Dyspnea R06.00  History:        Patient has prior history of Echocardiogram examinations, most                 recent 08/17/2020. Signs/Symptoms:Dyspnea; Risk                 Factors:Hypertension.  Sonographer:    Bernadene Person RDCS Referring Phys: [id number] [place]  1. Left ventricular ejection fraction, by estimation, is 25%. The left ventricle has severely decreased function. The left ventricle demonstrates global hypokinesis. Prominent trabeculation toward the LV apex raises concern for LV noncompaction. There is mild concentric left ventricular hypertrophy. Left ventricular diastolic parameters are  consistent with Grade I diastolic dysfunction (impaired relaxation).  2. Right ventricular systolic function is mildly reduced. The right ventricular size is normal. There is moderately elevated pulmonary artery systolic pressure. The estimated right ventricular systolic pressure is 28.3 mmHg.  3. Left atrial size was mildly dilated.  4. Right atrial size was mildly dilated.  5. The mitral valve is normal in structure. Mild mitral valve regurgitation. No evidence of mitral stenosis.  6. Tricuspid valve regurgitation is moderate.  7. The aortic valve is tricuspid. There is mild calcification of the aortic valve. Aortic valve regurgitation is not visualized. No aortic stenosis is present.  8. The inferior vena cava is dilated in size with <50%  respiratory variability, suggesting right atrial pressure of 15 mmHg. FINDINGS  Left Ventricle: Left ventricular ejection fraction, by estimation, is 25%. The left ventricle has severely decreased function. The left ventricle demonstrates global hypokinesis. The left ventricular internal cavity size was normal in size. There is mild concentric left ventricular hypertrophy. Left ventricular diastolic parameters are consistent with Grade I diastolic dysfunction (impaired relaxation). Right Ventricle: The right ventricular size is normal. No increase in right ventricular wall thickness. Right ventricular systolic function is mildly reduced. There is moderately elevated pulmonary artery systolic pressure. The tricuspid regurgitant velocity is 3.05 m/s, and with an assumed right atrial pressure of 15 mmHg, the estimated right ventricular systolic pressure is 15.1 mmHg. Left Atrium: Left atrial size was mildly dilated. Right Atrium: Right atrial size was mildly dilated. Pericardium: There is no evidence of pericardial effusion. Mitral Valve: The mitral valve is normal in structure. Mild mitral valve regurgitation. No evidence of mitral valve stenosis. Tricuspid Valve: The tricuspid valve is normal in structure. Tricuspid valve regurgitation is moderate. Aortic Valve: The aortic valve is tricuspid. There is mild calcification of the aortic valve. Aortic valve regurgitation is not visualized. No aortic stenosis is present. Pulmonic Valve: The pulmonic valve was normal in structure. Pulmonic valve regurgitation is mild. Aorta: The aortic root is normal in size and structure. Venous: The inferior vena cava is dilated in size with less than 50% respiratory variability, suggesting right atrial pressure of 15 mmHg. IAS/Shunts: No atrial level shunt detected by color flow Doppler.  LEFT VENTRICLE PLAX 2D LVIDd:         5.30 cm      Diastology LVIDs:         4.10 cm      LV e' medial:    5.81 cm/s LV PW:         0.80 cm      LV  E/e' medial:  18.8 LV IVS:        0.80 cm      LV e' lateral:   8.31 cm/s LVOT diam:     1.90 cm      LV E/e' lateral: 13.1 LV SV:         52 LV SV Index:   34 LVOT Area:     2.84 cm                              3D Volume EF: LV Volumes (MOD)            3D EF:        32 % LV vol d, MOD A2C: 132.0 ml LV EDV:       176 ml LV vol d, MOD A4C: 126.0 ml LV ESV:  120 ml LV vol s, MOD A2C: 85.5 ml  LV SV:        56 ml LV vol s, MOD A4C: 84.5 ml LV SV MOD A2C:     46.5 ml LV SV MOD A4C:     126.0 ml LV SV MOD BP:      45.4 ml RIGHT VENTRICLE RV S prime:     10.40 cm/s TAPSE (M-mode): 1.9 cm LEFT ATRIUM             Index        RIGHT ATRIUM           Index LA diam:        4.10 cm 2.72 cm/m   RA Area:     19.10 cm LA Vol (A2C):   50.3 ml 33.37 ml/m  RA Volume:   47.60 ml  31.58 ml/m LA Vol (A4C):   53.7 ml 35.63 ml/m LA Biplane Vol: 57.4 ml 38.08 ml/m  AORTIC VALVE             PULMONIC VALVE LVOT Vmax:   115.00 cm/s PR End Diast Vel: 5.86 msec LVOT Vmean:  75.800 cm/s LVOT VTI:    0.183 m  AORTA Ao Root diam: 3.00 cm Ao Asc diam:  3.20 cm MITRAL VALVE                  TRICUSPID VALVE MV Area (PHT): 5.66 cm       TR Peak grad:   37.2 mmHg MV Decel Time: 134 msec       TR Vmax:        305.00 cm/s MR Peak grad:    90.1 mmHg MR Mean grad:    52.5 mmHg    SHUNTS MR Vmax:         474.50 cm/s  Systemic VTI:  0.18 m MR Vmean:        340.5 cm/s   Systemic Diam: 1.90 cm MR PISA:         0.57 cm MR PISA Eff ROA: 5 mm MR PISA Radius:  0.30 cm MV E velocity: 109.00 cm/s MV A velocity: 114.00 cm/s MV E/A ratio:  0.96 Dalton McleanMD Electronically signed by Franki Monte Signature Date/Time: 10/06/2021/1:51:12 PM    Final     Cardiac Studies   EF 25% by echo. Cath without significant CAD  Patient Profile     66 y.o. female with HFrEF, NICM  Assessment & Plan    OK for discharge.  Home on metoprolol and BiDil.  Titrate as outpatient. Will arrange f/u.  Volume overload managed with dialysis.   ESRD: Tolerating  this well despite low EF.       For questions or updates, please contact [place] Please consult [email or web address] for contact info under        Signed, Larae Grooms, MD  10/08/2021, 11:28 AM

## 2021-10-08 NOTE — Progress Notes (Signed)
D/C order noted. Contacted [facility] and spoke to [place]. Clinic advised pt will d/c today and resume care tomorrow. Clinic advised that pt's dry weight may need to be reviewed/addressed per inpt nephrologist (asked that navigator make clinic aware of this info at d/c). Clinic has access to [facility] epic to obtain d/c summary and renal notes for continuation of care.   Melven Sartorius Renal Navigator [phone]

## 2021-10-08 NOTE — Hospital Course (Addendum)
Barbara Crawford. [name] is a 66 year old female who presented with shortness of breath.  Was found to have pulmonary edema on x-ray likely secondary to ESRD and worsening HFrEF.  Sinus tachycardia and hypertension were managed during this admission.  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  Her hospital course is detailed below.  Pulmonary edema Patient presents with shortness of breath, was found to have pulmonary edema on chest x-ray.  risk factors included ESRD on HD TTS, and HFrEF.  Symptoms improved after urgent round of hemodialysis on admission.  Patient remained euvolemic and asymptomatic during admission. She continued her HD on TTS schedule.  Additionally echo showed LVEF of 25%, down from 40-45% 1 year ago ago (see below for details).  Leading differentials of pulmonary edema are ESRD requiring HD and CHF exacerbation.  Patient stable at time of discharge.  CHF (congestive heart failure) ([place]) Patient was noted to have elevated hs-troponins which peaked in the 250s. EKG showed possible ischemic changes.  Cardiology was consulted and echo was ordered.  LVEF of 25%, which is reduced from her previous LVEF of 45% last year. Heart cath was completed which did not show any significant obstruction, no stents were placed.  Cardiology opted for medical management of CHF, however goal-directed therapy is complicated by ESRD.  Patient was started on BiDil, with consideration of ARB if blood pressure remains elevated as outpatient.  No room for SGLT2/MRA per cardiology, due to renal disease.  PT/OT had no recommendations, patient was ambulating and caring for herself well.  She will need to follow-up with cardiology as outpatient.   ESRD Nephrology was consulted for HD.  She continued HD on TTS schedule.   Sinus tachycardia Patient has chronic history of tachycardia.  Her heart rate remained within goal <100 with metoprolol 12.5 mg twice daily.    Hypertension Hypertension remained within normal limits during admission.  Can consider ARB if blood pressure is elevated as outpatient.   Follow-up instructions for PCP Please ensure patient has appropriate cardiology outpatient follow-up Ensure patient is adhering to fluid restriction and attending her hemodialysis Consider ARB therapy for hypertension and goal-directed therapy of congestive heart failure if blood pressure remains elevated as outpatient

## 2021-10-08 NOTE — Progress Notes (Signed)
Blood pressure dropped to 89/57, patient became nauseas, UF turned off,. Bp rechecked now 111/55. Provider Penninger ([place]) notified Zofran ordered, and will be administered.

## 2021-10-08 NOTE — Discharge Summary (Addendum)
[facility] Discharge Summary  Patient name: Barbara Crawford Medical record number: [id number] Date of birth: 07/18/1955 Age: 66 y.o. Gender: female Date of Admission: 10/05/2021  Date of Discharge: 10/6 Admitting Physician: Barbara Grip, MD  Primary Care Provider: Gerald Crawford Consultants: Cardiology  Indication for Hospitalization: Shortness of breath  Brief Hospital Course:  Barbara Crawford. Barbara Crawford is a 66 year old female who presented with shortness of breath.  Was found to have pulmonary edema on x-ray likely secondary to ESRD and worsening HFrEF.  Sinus tachycardia and hypertension were managed during this admission.  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  Her hospital course is detailed below.  Pulmonary edema Patient presents with shortness of breath, was found to have pulmonary edema on chest x-ray.  risk factors included ESRD on HD TTS, and HFrEF.  Symptoms improved after urgent round of hemodialysis on admission.  Patient remained euvolemic and asymptomatic during admission. She continued her HD on TTS schedule.  Additionally echo showed LVEF of 25%, down from 40-45% 1 year ago ago (see below for details).  Leading differentials of pulmonary edema are ESRD requiring HD and CHF exacerbation.  Patient stable at time of discharge.  CHF (congestive heart failure) ([place]) Patient was noted to have elevated hs-troponins which peaked in the 250s. EKG showed possible ischemic changes.  Cardiology was consulted and echo was ordered.  LVEF of 25%, which is reduced from her previous LVEF of 45% last year. Heart cath was completed which did not show any significant obstruction, no stents were placed.  Cardiology opted for medical management of CHF, however goal-directed therapy is complicated by ESRD.  Patient was started on BiDil, with consideration of ARB if blood pressure remains elevated as outpatient.  No  room for SGLT2/MRA per cardiology, due to renal disease.  PT/OT had no recommendations, patient was ambulating and caring for herself well.  She will need to follow-up with cardiology as outpatient.   ESRD Nephrology was consulted for HD.  She continued HD on TTS schedule.   Sinus tachycardia Patient has chronic history of tachycardia.  Her heart rate remained within goal <100 with metoprolol 12.5 mg twice daily.   Hypertension Hypertension remained within normal limits during admission.  Can consider ARB if blood pressure is elevated as outpatient.   Follow-up instructions for PCP Please ensure patient has appropriate cardiology outpatient follow-up Ensure patient is adhering to fluid restriction and attending her hemodialysis Consider ARB therapy for hypertension and goal-directed therapy of congestive heart failure if blood pressure remains elevated as outpatient   Discharge Diagnoses/Problem List:  CHF (congestive heart failure) (HCC) Tachycardia HTN (hypertension)  Disposition: Home  Discharge Condition: Stable  Discharge Exam:  General: Not in acute distress, eating in bed Cardio: Regular rate and rhythm Respiratory: Clear to auscultation bilaterally, normal work breathing on room air GI: Soft, nontender nondistended.  Bowel sounds present Neuro: Alert and oriented x4. Extremities: no edema   Significant Procedures: Heart cath  Significant Labs and Imaging:  Recent Labs  Lab 10/07/21 0556  WBC 5.1  HGB 10.9*  HCT 34.0*  PLT 109*   Recent Labs  Lab 10/07/21 0556 10/08/21 0145  NA 138 137  K 4.6 4.1  CL 101 101  CO2 25 22  GLUCOSE 102* 114*  BUN 67* 85*  CREATININE 9.63* 10.86*  CALCIUM 7.9* 7.6*  PHOS 5.8*  --   ALBUMIN 2.5*  --     Results/Tests  Pending at Time of Discharge: None   Imaging/Procedure findings CARDIAC CATHETERIZATION  Result Date: 10/07/2021   Prox LAD to Mid LAD lesion is 20% stenosed.   2nd Diag lesion is 80% stenosed.   Prox  RCA to Mid RCA lesion is 10% stenosed.   LV end diastolic pressure is mildly elevated.   Hemodynamic findings consistent with mild pulmonary hypertension.   There is no aortic valve stenosis. 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 Otherwise minimal CAD Mildly elevated LV filling pressures. PCWP 23/27 with mean 19 mm Hg Mild pulmonary HTN. PAP 41/18 mean 28 mm Hg High cardiac output with index 6.41. Plan: medical management   ECHOCARDIOGRAM COMPLETE  Result Date: 10/06/2021    ECHOCARDIOGRAM REPORT   Patient Name:   Barbara Crawford Date of Exam: 10/06/2021 Medical Rec #:  [id number]             Height:       63.0 in Accession #:    [id number]            Weight:       111.3 lb Date of Birth:  [birth year]            BSA:          1.507 m Patient Age:    38 years              BP:           102/70 mmHg Patient Gender: F                     HR:           86 bpm. Exam Location:  Inpatient Procedure: 2D Echo, Cardiac Doppler, Color Doppler, Intracardiac Opacification            Agent and 3D Echo Indications:    Dyspnea R06.00  History:        Patient has prior history of Echocardiogram examinations, most                 recent 08/17/2020. Signs/Symptoms:Dyspnea; Risk                 Factors:Hypertension.  Sonographer:    Bernadene Person RDCS Referring Phys: [id number] [place]  1. Left ventricular ejection fraction, by estimation, is 25%. The left ventricle has severely decreased function. The left ventricle demonstrates global hypokinesis. Prominent trabeculation toward the LV apex raises concern for LV noncompaction. There is mild concentric left ventricular hypertrophy. Left ventricular diastolic parameters are consistent with Grade I diastolic dysfunction (impaired relaxation).  2. Right ventricular systolic function is mildly reduced. The right ventricular size is normal. There is moderately elevated pulmonary artery systolic pressure. The estimated  right ventricular systolic pressure is 94.8 mmHg.  3. Left atrial size was mildly dilated.  4. Right atrial size was mildly dilated.  5. The mitral valve is normal in structure. Mild mitral valve regurgitation. No evidence of mitral stenosis.  6. Tricuspid valve regurgitation is moderate.  7. The aortic valve is tricuspid. There is mild calcification of the aortic valve. Aortic valve regurgitation is not visualized. No aortic stenosis is present.  8. The inferior vena cava is dilated in size with <50% respiratory variability, suggesting right atrial pressure of 15 mmHg. FINDINGS  Left Ventricle: Left ventricular ejection fraction, by estimation, is 25%. The left ventricle has severely decreased function. The left ventricle demonstrates global hypokinesis. The left ventricular internal  cavity size was normal in size. There is mild concentric left ventricular hypertrophy. Left ventricular diastolic parameters are consistent with Grade I diastolic dysfunction (impaired relaxation). Right Ventricle: The right ventricular size is normal. No increase in right ventricular wall thickness. Right ventricular systolic function is mildly reduced. There is moderately elevated pulmonary artery systolic pressure. The tricuspid regurgitant velocity is 3.05 m/s, and with an assumed right atrial pressure of 15 mmHg, the estimated right ventricular systolic pressure is 41.7 mmHg. Left Atrium: Left atrial size was mildly dilated. Right Atrium: Right atrial size was mildly dilated. Pericardium: There is no evidence of pericardial effusion. Mitral Valve: The mitral valve is normal in structure. Mild mitral valve regurgitation. No evidence of mitral valve stenosis. Tricuspid Valve: The tricuspid valve is normal in structure. Tricuspid valve regurgitation is moderate. Aortic Valve: The aortic valve is tricuspid. There is mild calcification of the aortic valve. Aortic valve regurgitation is not visualized. No aortic stenosis is present.  Pulmonic Valve: The pulmonic valve was normal in structure. Pulmonic valve regurgitation is mild. Aorta: The aortic root is normal in size and structure. Venous: The inferior vena cava is dilated in size with less than 50% respiratory variability, suggesting right atrial pressure of 15 mmHg. IAS/Shunts: No atrial level shunt detected by color flow Doppler.  LEFT VENTRICLE PLAX 2D LVIDd:         5.30 cm      Diastology LVIDs:         4.10 cm      LV e' medial:    5.81 cm/s LV PW:         0.80 cm      LV E/e' medial:  18.8 LV IVS:        0.80 cm      LV e' lateral:   8.31 cm/s LVOT diam:     1.90 cm      LV E/e' lateral: 13.1 LV SV:         52 LV SV Index:   34 LVOT Area:     2.84 cm                              3D Volume EF: LV Volumes (MOD)            3D EF:        32 % LV vol d, MOD A2C: 132.0 ml LV EDV:       176 ml LV vol d, MOD A4C: 126.0 ml LV ESV:       120 ml LV vol s, MOD A2C: 85.5 ml  LV SV:        56 ml LV vol s, MOD A4C: 84.5 ml LV SV MOD A2C:     46.5 ml LV SV MOD A4C:     126.0 ml LV SV MOD BP:      45.4 ml RIGHT VENTRICLE RV S prime:     10.40 cm/s TAPSE (M-mode): 1.9 cm LEFT ATRIUM             Index        RIGHT ATRIUM           Index LA diam:        4.10 cm 2.72 cm/m   RA Area:     19.10 cm LA Vol (A2C):   50.3 ml 33.37 ml/m  RA Volume:   47.60 ml  31.58 ml/m LA Vol (A4C):  53.7 ml 35.63 ml/m LA Biplane Vol: 57.4 ml 38.08 ml/m  AORTIC VALVE             PULMONIC VALVE LVOT Vmax:   115.00 cm/s PR End Diast Vel: 5.86 msec LVOT Vmean:  75.800 cm/s LVOT VTI:    0.183 m  AORTA Ao Root diam: 3.00 cm Ao Asc diam:  3.20 cm MITRAL VALVE                  TRICUSPID VALVE MV Area (PHT): 5.66 cm       TR Peak grad:   37.2 mmHg MV Decel Time: 134 msec       TR Vmax:        305.00 cm/s MR Peak grad:    90.1 mmHg MR Mean grad:    52.5 mmHg    SHUNTS MR Vmax:         474.50 cm/s  Systemic VTI:  0.18 m MR Vmean:        340.5 cm/s   Systemic Diam: 1.90 cm MR PISA:         0.57 cm MR PISA Eff ROA: 5 mm MR PISA  Radius:  0.30 cm MV E velocity: 109.00 cm/s MV A velocity: 114.00 cm/s MV E/A ratio:  0.96 Dalton McleanMD Electronically signed by Franki Monte Signature Date/Time: 10/06/2021/1:51:12 PM    Final      Discharge Medications:  Allergies as of 10/08/2021       Reactions   Bee Venom Anaphylaxis   Shellfish-derived Products Swelling, Other (See Comments)   Sweat and Dizzy   Erythromycin Other (See Comments)   Pt does not recall   Penicillins    Has patient had a PCN reaction causing immediate rash, facial/tongue/throat swelling, SOB or lightheadedness with hypotension: no Has patient had a PCN reaction causing severe rash involving mucus membranes or skin necrosis: yes Has patient had a PCN reaction that required hospitalization: no Has patient had a PCN reaction occurring within the last 10 years: no If all of the above answers are "NO", then may proceed with Cephalosporin use.   Chlorhexidine Rash   Iodine Rash, Other (See Comments)   Arm flares up per pt        Medication List     STOP taking these medications    metoprolol tartrate 25 MG tablet Commonly known as: LOPRESSOR   NIFEdipine 60 MG 24 hr tablet Commonly known as: ADALAT CC       TAKE these medications    acetaminophen 500 MG tablet Commonly known as: TYLENOL Take 500 mg by mouth every 6 (six) hours as needed for mild pain or moderate pain.   aspirin 81 MG chewable tablet Chew 81 mg by mouth daily.   ferric citrate 1 GM 210 MG(Fe) tablet Commonly known as: AURYXIA Take 210 mg by mouth 3 (three) times daily with meals.   ferrous sulfate 325 (65 FE) MG tablet Take 325 mg by mouth daily with breakfast.   isosorbide-hydrALAZINE 20-37.5 MG tablet Commonly known as: BIDIL Take 0.5 tablets by mouth 3 (three) times daily.   metoprolol succinate 25 MG 24 hr tablet Commonly known as: Toprol XL Take 1 tablet (25 mg total) by mouth daily.   multivitamin with minerals Tabs tablet Take 1 tablet by mouth  daily.        Discharge Instructions: Please refer to Patient Instructions section of EMR for full details.  Patient was counseled important signs and symptoms that should prompt return  to medical care, changes in medications, dietary instructions, activity restrictions, and follow up appointments.   Follow-Up Appointments:  Follow-up Information     Park Meo T, PA-C .   Specialty: Physician Assistant Contact information: [place] [address] [phone]                 Leslie Dales, DO 10/08/2021, 11:06 AM PGY-1, [facility]  I was personally present and performed or re-performed the history, physical exam and medical decision making activities of this service and have verified that the service and findings are accurately documented in the resident's note.  Zola Button, MD                  10/08/2021, 12:23 PM

## 2021-10-09 LAB — LIPOPROTEIN A (LPA): Lipoprotein (a): 172.9 nmol/L — ABNORMAL HIGH (ref <75.0)

## 2021-10-21 ENCOUNTER — Ambulatory Visit: Payer: Medicare Other | Admitting: Nurse Practitioner

## 2021-10-24 NOTE — Progress Notes (Unsigned)
Office Visit    Patient Name: Barbara Crawford Date of Encounter: 10/24/2021  Primary Care Provider:  Gerald Leitz Primary Cardiologist:  Larae Grooms, MD Primary Electrophysiologist: None  Chief Complaint    Barbara Crawford is a 66 y.o. female with PMH of ESRD on ESRD, HFmrEF, HTN and anxiety who presents today for follow-up of acute systolic CHF.  Past Medical History    Past Medical History:  Diagnosis Date   Anxiety    ESRD (end stage renal disease) on dialysis ([place])    Heart failure with mildly reduced ejection fraction (HFmrEF) ([place])    Hypertension    Past Surgical History:  Procedure Laterality Date   AV FISTULA PLACEMENT Left 03/13/2020   Procedure: Creation of Left Brachiocephalic fistula;  Surgeon: Marty Heck, MD;  Location: [facility];  Service: Vascular;  Laterality: Left;   IR FLUORO GUIDE CV LINE RIGHT  02/26/2020   IR FLUORO GUIDE CV LINE RIGHT  03/09/2020   IR US GUIDE VASC ACCESS RIGHT  02/26/2020   IR US GUIDE VASC ACCESS RIGHT  03/09/2020   RIGHT/LEFT HEART CATH AND CORONARY ANGIOGRAPHY N/A 10/07/2021   Procedure: RIGHT/LEFT HEART CATH AND CORONARY ANGIOGRAPHY;  Surgeon: Martinique, Peter M, MD;  Location: [facility];  Service: Cardiovascular;  Laterality: N/A;    Allergies  Allergies  Allergen Reactions   Bee Venom Anaphylaxis   Shellfish-Derived Products Swelling and Other (See Comments)    Sweat and Dizzy   Erythromycin Other (See Comments)    Pt does not recall   Penicillins     Has patient had a PCN reaction causing immediate rash, facial/tongue/throat swelling, SOB or lightheadedness with hypotension: no Has patient had a PCN reaction causing severe rash involving mucus membranes or skin necrosis: yes Has patient had a PCN reaction that required hospitalization: no Has patient had a PCN reaction occurring within the last 10 years: no If all of the above answers are "NO", then may proceed with Cephalosporin  use.   Chlorhexidine Rash   Iodine Rash and Other (See Comments)    Arm flares up per pt    History of Present Illness    Barbara Crawford  is a 66 year old female with the above mention past medical history who presents today for follow-up of hospitalization of acute on chronic CHF with elevated troponins.  Barbara Crawford presented to the ED via EMS on 10/3 with complaint of sudden onset of shortness of breath.  Patient was noted to be hypoxic and was placed on 6 L nasal cannula and brought to the ED for further follow-up.  EKG in the ED shows sinus tach with left anterior fascicular block, LVH with nonspecific T wave changes.  Patient's high-sensitivity troponin was 64>>74 and chest x-ray revealed cardiomegaly with pulmonary edema and small left-sided pleural effusion.  Patient has history of ESRD and was taken to HD for urgent dialysis.  Patient had improvement with HD however continued to have shortness of breath.  High-sensitivity troponins trended upward to 217.  2D echo was performed showing severely decreased LV function and EF of 25% with global hypokinesis with mild concentric LVH and grade 1 DD, with mildly reduced LV function and moderately elevated pulmonary artery systolic pressure of 27.0 mmHg, LA/RA mildly dilated with mild MV noted.  2D echo 08/2020 showed LVEF of 45%, global hypokinesis, g2DD, normal RV size and function, mild to moderate MR. Patient underwent R/LHC for further  evaluation that revealed 80% second diagonal lesion that was small caliber with minimal CAD and mildly elevated LV filling pressures with mild pulmonary hypertension with PAP 41/18 mean 28 mm Hg and cardiac output index of 6.41.  Medical management was indicated for treatment.  She was started on BiDil and metoprolol tartrate with no renal GDMT due to ESRD.  Consideration for ARB if blood pressure remains elevated and outpatient.  Since last being seen in the office patient reports***.  Patient denies chest pain,  palpitations, dyspnea, PND, orthopnea, nausea, vomiting, dizziness, syncope, edema, weight gain, or early satiety.   ***Notes: -GDMT with BiDil and metoprolol, -No room for SGLT2/MRA  Home Medications    Current Outpatient Medications  Medication Sig Dispense Refill   acetaminophen (TYLENOL) 500 MG tablet Take 500 mg by mouth every 6 (six) hours as needed for mild pain or moderate pain.     aspirin 81 MG chewable tablet Chew 81 mg by mouth daily.     ferric citrate (AURYXIA) 1 GM 210 MG(Fe) tablet Take 210 mg by mouth 3 (three) times daily with meals.     ferrous sulfate 325 (65 FE) MG tablet Take 325 mg by mouth daily with breakfast.     isosorbide-hydrALAZINE (BIDIL) 20-37.5 MG tablet Take 0.5 tablets by mouth 3 (three) times daily. 45 tablet 0   metoprolol succinate (TOPROL XL) 25 MG 24 hr tablet Take 1 tablet (25 mg total) by mouth daily. 30 tablet 0   Multiple Vitamin (MULTIVITAMIN WITH MINERALS) TABS tablet Take 1 tablet by mouth daily.     No current facility-administered medications for this visit.     Review of Systems  Please see the history of present illness.    (+)*** (+)***  All other systems reviewed and are otherwise negative except as noted above.  Physical Exam    Wt Readings from Last 3 Encounters:  10/08/21 112 lb (50.8 kg)  08/18/20 117 lb 15.1 oz (53.5 kg)  04/21/20 130 lb 6.4 oz (59.1 kg)   LT:JQZES were no vitals filed for this visit.,There is no height or weight on file to calculate BMI.  Constitutional:      Appearance: Healthy appearance. Not in distress.  Neck:     Vascular: JVD normal.  Pulmonary:     Effort: Pulmonary effort is normal.     Breath sounds: No wheezing. No rales. Diminished in the bases Cardiovascular:     Normal rate. Regular rhythm. Normal S1. Normal S2.      Murmurs: There is no murmur.  Edema:    Peripheral edema absent.  Abdominal:     Palpations: Abdomen is soft non tender. There is no hepatomegaly.  Skin:     General: Skin is warm and dry.  Neurological:     General: No focal deficit present.     Mental Status: Alert and oriented to person, place and time.     Cranial Nerves: Cranial nerves are intact.  EKG/LABS/Other Studies Reviewed    ECG personally reviewed by me today - ***  Risk Assessment/Calculations:   {Does this patient have ATRIAL FIBRILLATION?:[id number]}        Lab Results  Component Value Date   WBC 5.1 10/07/2021   HGB 11.6 (L) 10/07/2021   HCT 34.0 (L) 10/07/2021   MCV 95.0 10/07/2021   PLT 109 (L) 10/07/2021   Lab Results  Component Value Date   CREATININE 10.86 (H) 10/08/2021   BUN 85 (H) 10/08/2021   NA 137 10/08/2021  K 4.1 10/08/2021   CL 101 10/08/2021   CO2 22 10/08/2021   Lab Results  Component Value Date   ALT 24 10/05/2021   AST 34 10/05/2021   ALKPHOS 72 10/05/2021   BILITOT 0.6 10/05/2021   Lab Results  Component Value Date   CHOL 204 (H) 02/27/2020   HDL 52 02/27/2020   LDLCALC 129 (H) 02/27/2020   TRIG 68 08/16/2020   CHOLHDL 3.9 02/27/2020    Lab Results  Component Value Date   HGBA1C 5.4 08/15/2020    Assessment & Plan    1.  Combined chronic systolic and diastolic CHF: -2D echo was performed showing severely decreased LV function and EF of 25% with global hypokinesis with mild concentric LVH and grade 1 DD, with mildly reduced LV  -Today patient is*** -Patient currently not on SGLT2 or ARB due to ESRD -Volume currently managed by dialysis -Continue current GDMT with BiDil 20-37.5 mg 3 times daily and Toprol-XL 25 mg daily -Low sodium diet, fluid restriction <2L, and daily weights encouraged. Educated to contact our office for weight gain of 2 lbs overnight or 5 lbs in one week.   2.  Essential hypertension: -Patient's blood pressure today was*** -Continue Toprol and BiDil as noted above  3.  NICM: -R/LHC performed with nonobstructive CAD noted -  4.  Nonobstructive CAD: -R/LHC performed with 80% second diagonal lesion  that was small caliber with minimal CAD -Continue ASA 81 mg and Toprol 25 mg daily  5. ESRD: -HD on HD TTS       Disposition: Follow-up with Larae Grooms, MD or APP in *** months {Are you ordering a CV Procedure (e.g. stress test, cath, DCCV, TEE, etc)?   Press F2        :[id number]}   Medication Adjustments/Labs and Tests Ordered: Current medicines are reviewed at length with the patient today.  Concerns regarding medicines are outlined above.   Signed, Mable Fill, Marissa Nestle, NP 10/24/2021, 1:48 PM [facility]  Note:  This document was prepared using Dragon voice recognition software and may include unintentional dictation errors.

## 2021-10-25 ENCOUNTER — Encounter: Payer: Self-pay | Admitting: *Deleted

## 2021-10-25 ENCOUNTER — Encounter: Payer: Self-pay | Admitting: Nurse Practitioner

## 2021-10-25 ENCOUNTER — Ambulatory Visit: Payer: Medicare Other | Attending: Physician Assistant | Admitting: Nurse Practitioner

## 2021-10-25 VITALS — BP 148/70 | HR 100 | Ht 63.0 in | Wt 111.4 lb

## 2021-10-25 DIAGNOSIS — N186 End stage renal disease: Secondary | ICD-10-CM

## 2021-10-25 DIAGNOSIS — I5043 Acute on chronic combined systolic (congestive) and diastolic (congestive) heart failure: Secondary | ICD-10-CM | POA: Diagnosis not present

## 2021-10-25 DIAGNOSIS — I10 Essential (primary) hypertension: Secondary | ICD-10-CM

## 2021-10-25 DIAGNOSIS — I2510 Atherosclerotic heart disease of native coronary artery without angina pectoris: Secondary | ICD-10-CM

## 2021-10-25 DIAGNOSIS — I428 Other cardiomyopathies: Secondary | ICD-10-CM

## 2021-10-25 NOTE — Patient Instructions (Signed)
Medication Instructions:    Your physician recommends that you continue on your current medications as directed. Please refer to the Current Medication list given to you today.   *If you need a refill on your cardiac medications before your next appointment, please call your pharmacy*   Lab Work: [place]    If you have labs (blood work) drawn today and your tests are completely normal, you will receive your results only by: [place] (if you have MyChart) OR A paper copy in the mail If you have any lab test that is abnormal or we need to change your treatment, we will call you to review the results.   Testing/Procedures: NONE ORDERED  TODAY    Follow-Up: At [facility], you and your health needs are our priority.  As part of our continuing mission to provide you with exceptional heart care, we have created designated Provider Care Teams.  These Care Teams include your primary Cardiologist (physician) and Advanced Practice Providers (APPs -  Physician Assistants and Nurse Practitioners) who all work together to provide you with the care you need, when you need it.  We recommend signing up for the patient portal called "MyChart".  Sign up information is provided on this After Visit Summary.  MyChart is used to connect with patients for Virtual Visits (Telemedicine).  Patients are able to view lab/test results, encounter notes, upcoming appointments, etc.  Non-urgent messages can be sent to your provider as well.   To learn more about what you can do with MyChart, go to [email or web address].    Your next appointment:   3 month(s)  The format for your next appointment:   In Person  Provider:    Ambrose Pancoast, NP   Other Instructions  PLEASE [place]    Low-Sodium Eating Plan Sodium, which is an element that makes up salt, helps you maintain a healthy balance of fluids in your body. Too much sodium can  increase your blood pressure and cause fluid and waste to be held in your body. Your health care provider or dietitian may recommend following this plan if you have high blood pressure (hypertension), kidney disease, liver disease, or heart failure. Eating less sodium can help lower your blood pressure, reduce swelling, and protect your heart, liver, and kidneys. What are tips for following this plan? Reading food labels The Nutrition Facts label lists the amount of sodium in one serving of the food. If you eat more than one serving, you must multiply the listed amount of sodium by the number of servings. Choose foods with less than 140 mg of sodium per serving. Avoid foods with 300 mg of sodium or more per serving. Shopping  Look for lower-sodium products, often labeled as "low-sodium" or "no salt added." Always check the sodium content, even if foods are labeled as "unsalted" or "no salt added." Buy fresh foods. Avoid canned foods and pre-made or frozen meals. Avoid canned, cured, or processed meats. Buy breads that have less than 80 mg of sodium per slice. Cooking  Eat more home-cooked food and less restaurant, buffet, and fast food. Avoid adding salt when cooking. Use salt-free seasonings or herbs instead of table salt or sea salt. Check with your health care provider or pharmacist before using salt substitutes. Cook with plant-based oils, such as canola, sunflower, or olive oil. Meal planning When eating at a restaurant, ask that your food be prepared with less salt  or no salt, if possible. Avoid dishes labeled as brined, pickled, cured, smoked, or made with soy sauce, miso, or teriyaki sauce. Avoid foods that contain MSG (monosodium glutamate). MSG is sometimes added to [place] food, bouillon, and some canned foods. Make meals that can be grilled, baked, poached, roasted, or steamed. These are generally made with less sodium. General information Most people on this plan should limit  their sodium intake to 1,500-2,000 mg (milligrams) of sodium each day. What foods should I eat? Fruits Fresh, frozen, or canned fruit. Fruit juice. Vegetables Fresh or frozen vegetables. "No salt added" canned vegetables. "No salt added" tomato sauce and paste. Low-sodium or reduced-sodium tomato and vegetable juice. Grains Low-sodium cereals, including oats, puffed wheat and rice, and shredded wheat. Low-sodium crackers. Unsalted rice. Unsalted pasta. Low-sodium bread. Whole-grain breads and whole-grain pasta. Meats and other proteins Fresh or frozen (no salt added) meat, poultry, seafood, and fish. Low-sodium canned tuna and salmon. Unsalted nuts. Dried peas, beans, and lentils without added salt. Unsalted canned beans. Eggs. Unsalted nut butters. Dairy Milk. Soy milk. Cheese that is naturally low in sodium, such as ricotta cheese, fresh mozzarella, or Swiss cheese. Low-sodium or reduced-sodium cheese. Cream cheese. Yogurt. Seasonings and condiments Fresh and dried herbs and spices. Salt-free seasonings. Low-sodium mustard and ketchup. Sodium-free salad dressing. Sodium-free light mayonnaise. Fresh or refrigerated horseradish. Lemon juice. Vinegar. Other foods Homemade, reduced-sodium, or low-sodium soups. Unsalted popcorn and pretzels. Low-salt or salt-free chips. The items listed above may not be a complete list of foods and beverages you can eat. Contact a dietitian for more information. What foods should I avoid? Vegetables Sauerkraut, pickled vegetables, and relishes. Olives. [place] fries. Onion rings. Regular canned vegetables (not low-sodium or reduced-sodium). Regular canned tomato sauce and paste (not low-sodium or reduced-sodium). Regular tomato and vegetable juice (not low-sodium or reduced-sodium). Frozen vegetables in sauces. Grains Instant hot cereals. Bread stuffing, pancake, and biscuit mixes. Croutons. Seasoned rice or pasta mixes. Noodle soup cups. Boxed or frozen macaroni  and cheese. Regular salted crackers. Self-rising flour. Meats and other proteins Meat or fish that is salted, canned, smoked, spiced, or pickled. Precooked or cured meat, such as sausages or meat loaves. [place] Salines. Ham. Pepperoni. Hot dogs. Corned beef. Chipped beef. Salt pork. Jerky. Pickled herring. Anchovies and sardines. Regular canned tuna. Salted nuts. Dairy Processed cheese and cheese spreads. Hard cheeses. Cheese curds. Blue cheese. Feta cheese. String cheese. Regular cottage cheese. Buttermilk. Canned milk. Fats and oils Salted butter. Regular margarine. Ghee. Bacon fat. Seasonings and condiments Onion salt, garlic salt, seasoned salt, table salt, and sea salt. Canned and packaged gravies. Worcestershire sauce. Tartar sauce. Barbecue sauce. Teriyaki sauce. Soy sauce, including reduced-sodium. Steak sauce. Fish sauce. Oyster sauce. Cocktail sauce. Horseradish that you find on the shelf. Regular ketchup and mustard. Meat flavorings and tenderizers. Bouillon cubes. Hot sauce. Pre-made or packaged marinades. Pre-made or packaged taco seasonings. Relishes. Regular salad dressings. Salsa. Other foods Salted popcorn and pretzels. Corn chips and puffs. Potato and tortilla chips. Canned or dried soups. Pizza. Frozen entrees and pot pies. The items listed above may not be a complete list of foods and beverages you should avoid. Contact a dietitian for more information. Summary Eating less sodium can help lower your blood pressure, reduce swelling, and protect your heart, liver, and kidneys. Most people on this plan should limit their sodium intake to 1,500-2,000 mg (milligrams) of sodium each day. Canned, boxed, and frozen foods are high in sodium. Restaurant foods, fast foods, and pizza are also  very high in sodium. You also get sodium by adding salt to food. Try to cook at home, eat more fresh fruits and vegetables, and eat less fast food and canned, processed, or prepared foods. This information is  not intended to replace advice given to you by your health care provider. Make sure you discuss any questions you have with your health care provider. Document Revised: 01/25/2019 Document Reviewed: 11/21/2018 Elsevier Patient Education  [place] Eating a healthy diet is important for the health of your heart. A heart-healthy eating plan includes: Eating less unhealthy fats. Eating more healthy fats. Eating less salt in your food. Salt is also called sodium. Making other changes in your diet. Talk with your doctor or a diet specialist (dietitian) to create an eating plan that is right for you. What is my plan? Your doctor may recommend an eating plan that includes: Total fat: ______% or less of total calories a day. Saturated fat: ______% or less of total calories a day. Cholesterol: less than _________mg a day. Sodium: less than _________mg a day. What are tips for following this plan? Cooking Avoid frying your food. Try to bake, boil, grill, or broil it instead. You can also reduce fat by: Removing the skin from poultry. Removing all visible fats from meats. Steaming vegetables in water or broth. Meal planning  At meals, divide your plate into four equal parts: Fill one-half of your plate with vegetables and green salads. Fill one-fourth of your plate with whole grains. Fill one-fourth of your plate with lean protein foods. Eat 2-4 cups of vegetables per day. One cup of vegetables is: 1 cup (91 g) broccoli or cauliflower florets. 2 medium carrots. 1 large bell pepper. 1 large sweet potato. 1 large tomato. 1 medium white potato. 2 cups (150 g) raw leafy greens. Eat 1-2 cups of fruit per day. One cup of fruit is: 1 small apple 1 large banana 1 cup (237 g) mixed fruit, 1 large orange,  cup (82 g) dried fruit, 1 cup (240 mL) 100% fruit juice. Eat more foods that have soluble fiber. These are apples, broccoli, carrots, beans, peas,  and barley. Try to get 20-30 g of fiber per day. Eat 4-5 servings of nuts, legumes, and seeds per week: 1 serving of dried beans or legumes equals  cup (90 g) cooked. 1 serving of nuts is  oz (12 almonds, 24 pistachios, or 7 walnut halves). 1 serving of seeds equals  oz (8 g). General information Eat more home-cooked food. Eat less restaurant, buffet, and fast food. Limit or avoid alcohol. Limit foods that are high in starch and sugar. Avoid fried foods. Lose weight if you are overweight. Keep track of how much salt (sodium) you eat. This is important if you have high blood pressure. Ask your doctor to tell you more about this. Try to add vegetarian meals each week. Fats Choose healthy fats. These include olive oil and canola oil, flaxseeds, walnuts, almonds, and seeds. Eat more omega-3 fats. These include salmon, mackerel, sardines, tuna, flaxseed oil, and ground flaxseeds. Try to eat fish at least 2 times each week. Check food labels. Avoid foods with trans fats or high amounts of saturated fat. Limit saturated fats. These are often found in animal products, such as meats, butter, and cream. These are also found in plant foods, such as palm oil, palm kernel oil, and coconut oil. Avoid foods with partially hydrogenated oils in them. These have trans fats. Examples  are stick margarine, some tub margarines, cookies, crackers, and other baked goods. What foods should I eat? Fruits All fresh, canned (in natural juice), or frozen fruits. Vegetables Fresh or frozen vegetables (raw, steamed, roasted, or grilled). Green salads. Grains Most grains. Choose whole wheat and whole grains most of the time. Rice and pasta, including brown rice and pastas made with whole wheat. Meats and other proteins Lean, well-trimmed beef, veal, pork, and lamb. Chicken and [place] without skin. All fish and shellfish. Wild duck, rabbit, pheasant, and venison. Egg whites or low-cholesterol egg substitutes. Dried  beans, peas, lentils, and tofu. Seeds and most nuts. Dairy Low-fat or nonfat cheeses, including ricotta and mozzarella. Skim or 1% milk that is liquid, powdered, or evaporated. Buttermilk that is made with low-fat milk. Nonfat or low-fat yogurt. Fats and oils Non-hydrogenated (trans-free) margarines. Vegetable oils, including soybean, sesame, sunflower, olive, peanut, safflower, corn, canola, and cottonseed. Salad dressings or mayonnaise made with a vegetable oil. Beverages Mineral water. Coffee and tea. Diet carbonated beverages. Sweets and desserts Sherbet, gelatin, and fruit ice. Small amounts of dark chocolate. Limit all sweets and desserts. Seasonings and condiments All seasonings and condiments. The items listed above may not be a complete list of foods and drinks you can eat. Contact a dietitian for more options. What foods should I avoid? Fruits Canned fruit in heavy syrup. Fruit in cream or butter sauce. Fried fruit. Limit coconut. Vegetables Vegetables cooked in cheese, cream, or butter sauce. Fried vegetables. Grains Breads that are made with saturated or trans fats, oils, or whole milk. Croissants. Sweet rolls. Donuts. High-fat crackers, such as cheese crackers. Meats and other proteins Fatty meats, such as hot dogs, ribs, sausage, bacon, rib-eye roast or steak. High-fat deli meats, such as salami and bologna. Caviar. Domestic duck and goose. Organ meats, such as liver. Dairy Cream, sour cream, cream cheese, and creamed cottage cheese. Whole-milk cheeses. Whole or 2% milk that is liquid, evaporated, or condensed. Whole buttermilk. Cream sauce or high-fat cheese sauce. Yogurt that is made from whole milk. Fats and oils Meat fat, or shortening. Cocoa butter, hydrogenated oils, palm oil, coconut oil, palm kernel oil. Solid fats and shortenings, including bacon fat, salt pork, lard, and butter. Nondairy cream substitutes. Salad dressings with cheese or sour cream. Beverages Regular  sodas and juice drinks with added sugar. Sweets and desserts Frosting. Pudding. Cookies. Cakes. Pies. Milk chocolate or white chocolate. Buttered syrups. Full-fat ice cream or ice cream drinks. The items listed above may not be a complete list of foods and drinks to avoid. Contact a dietitian for more information. Summary Heart-healthy meal planning includes eating less unhealthy fats, eating more healthy fats, and making other changes in your diet. Eat a balanced diet. This includes fruits and vegetables, low-fat or nonfat dairy, lean protein, nuts and legumes, whole grains, and heart-healthy oils and fats. This information is not intended to replace advice given to you by your health care provider. Make sure you discuss any questions you have with your health care provider. Document Revised: 01/25/2021 Document Reviewed: 01/25/2021 Elsevier Patient Education  [place]

## 2021-11-05 ENCOUNTER — Ambulatory Visit: Payer: Medicare Other | Admitting: Physician Assistant

## 2021-11-17 ENCOUNTER — Ambulatory Visit: Payer: Medicare Other

## 2021-11-17 ENCOUNTER — Telehealth: Payer: Self-pay

## 2021-11-17 MED ORDER — HYDRALAZINE HCL 25 MG PO TABS: 25.0000 mg | ORAL_TABLET | Freq: Three times a day (TID) | ORAL | 3 refills | Status: DC

## 2021-11-17 NOTE — Telephone Encounter (Signed)
Rx(s) sent to pharmacy electronically.  Patient notified directly and voiced understanding.

## 2021-11-17 NOTE — Telephone Encounter (Signed)
Per Jaquelyn Bitter  Please discontinue isosorbide hydralazine 30-37.5 mg due to headache and changed to hydralazine 37.5 3 times daily.  Please advise patient to continue to check her blood pressures and contact us if she has any questions at this time.  Ambrose Pancoast, NP    Patient notified directly and agreeable. The Hydralazine does not come in 37.5mg  I only saw 10mg , 25mg  and 100mg . I advised the patient that I would speak with Jaquelyn Bitter and get back with her for the correct dose of medication. Patient agreeable and voiced understanding.

## 2021-12-01 ENCOUNTER — Ambulatory Visit: Payer: Medicare Other

## 2021-12-10 ENCOUNTER — Other Ambulatory Visit: Payer: Self-pay

## 2021-12-10 ENCOUNTER — Emergency Department (HOSPITAL_COMMUNITY): Payer: Medicare Other

## 2021-12-10 ENCOUNTER — Inpatient Hospital Stay (HOSPITAL_COMMUNITY)
Admission: EM | Admit: 2021-12-10 | Discharge: 2021-12-13 | DRG: 193 | Disposition: A | Discharge status: Home Health | Payer: Medicare Other | Attending: Family Medicine | Admitting: Family Medicine

## 2021-12-10 DIAGNOSIS — J069 Acute upper respiratory infection, unspecified: Secondary | ICD-10-CM

## 2021-12-10 DIAGNOSIS — I132 Hypertensive heart and chronic kidney disease with heart failure and with stage 5 chronic kidney disease, or end stage renal disease: Secondary | ICD-10-CM | POA: Diagnosis present

## 2021-12-10 DIAGNOSIS — Y95 Nosocomial condition: Secondary | ICD-10-CM | POA: Diagnosis present

## 2021-12-10 DIAGNOSIS — I2510 Atherosclerotic heart disease of native coronary artery without angina pectoris: Secondary | ICD-10-CM | POA: Diagnosis present

## 2021-12-10 DIAGNOSIS — Z91012 Allergy to eggs: Secondary | ICD-10-CM

## 2021-12-10 DIAGNOSIS — J189 Pneumonia, unspecified organism: Secondary | ICD-10-CM | POA: Diagnosis not present

## 2021-12-10 DIAGNOSIS — Z992 Dependence on renal dialysis: Secondary | ICD-10-CM

## 2021-12-10 DIAGNOSIS — Z881 Allergy status to other antibiotic agents status: Secondary | ICD-10-CM

## 2021-12-10 DIAGNOSIS — Z833 Family history of diabetes mellitus: Secondary | ICD-10-CM

## 2021-12-10 DIAGNOSIS — D631 Anemia in chronic kidney disease: Secondary | ICD-10-CM | POA: Diagnosis present

## 2021-12-10 DIAGNOSIS — I10 Essential (primary) hypertension: Secondary | ICD-10-CM | POA: Diagnosis present

## 2021-12-10 DIAGNOSIS — Z880 Allergy status to penicillin: Secondary | ICD-10-CM

## 2021-12-10 DIAGNOSIS — I959 Hypotension, unspecified: Secondary | ICD-10-CM | POA: Diagnosis present

## 2021-12-10 DIAGNOSIS — Z8249 Family history of ischemic heart disease and other diseases of the circulatory system: Secondary | ICD-10-CM

## 2021-12-10 DIAGNOSIS — Z888 Allergy status to other drugs, medicaments and biological substances status: Secondary | ICD-10-CM

## 2021-12-10 DIAGNOSIS — E8889 Other specified metabolic disorders: Secondary | ICD-10-CM | POA: Diagnosis present

## 2021-12-10 DIAGNOSIS — Z1152 Encounter for screening for COVID-19: Secondary | ICD-10-CM

## 2021-12-10 DIAGNOSIS — Z91013 Allergy to seafood: Secondary | ICD-10-CM

## 2021-12-10 DIAGNOSIS — I5022 Chronic systolic (congestive) heart failure: Secondary | ICD-10-CM | POA: Diagnosis present

## 2021-12-10 DIAGNOSIS — R9431 Abnormal electrocardiogram [ECG] [EKG]: Secondary | ICD-10-CM | POA: Diagnosis present

## 2021-12-10 DIAGNOSIS — Z91030 Bee allergy status: Secondary | ICD-10-CM

## 2021-12-10 DIAGNOSIS — F419 Anxiety disorder, unspecified: Secondary | ICD-10-CM | POA: Diagnosis present

## 2021-12-10 DIAGNOSIS — N186 End stage renal disease: Secondary | ICD-10-CM | POA: Diagnosis present

## 2021-12-10 DIAGNOSIS — N2581 Secondary hyperparathyroidism of renal origin: Secondary | ICD-10-CM | POA: Diagnosis present

## 2021-12-10 LAB — CBC
HCT: 41 % (ref 36.0–46.0)
Hemoglobin: 12.2 g/dL (ref 12.0–15.0)
MCH: 28.3 pg (ref 26.0–34.0)
MCHC: 29.8 g/dL — ABNORMAL LOW (ref 30.0–36.0)
MCV: 95.1 fL (ref 80.0–100.0)
Platelets: 205 10*3/uL (ref 150–400)
RBC: 4.31 MIL/uL (ref 3.87–5.11)
RDW: 15.9 % — ABNORMAL HIGH (ref 11.5–15.5)
WBC: 7 10*3/uL (ref 4.0–10.5)
nRBC: 0.4 % — ABNORMAL HIGH (ref 0.0–0.2)

## 2021-12-10 LAB — RESP PANEL BY RT-PCR (FLU A&B, COVID) ARPGX2
Influenza A by PCR: NEGATIVE
Influenza B by PCR: NEGATIVE
SARS Coronavirus 2 by RT PCR: NEGATIVE

## 2021-12-10 NOTE — ED Provider Notes (Signed)
[facility] EMERGENCY DEPARTMENT Provider Note   CSN: [id number] Arrival date & time: 12/10/21  1836     History  Chief Complaint  Patient presents with   Shortness of Breath    Torri Michalski is a 66 y.o. female.   Shortness of Breath    Patient has a history of hypertension, chronic kidney disease, heart failure, anxiety.  She presents to the ED with complaints of recent cough congestion fever and shortness of breath.  Patient has been compliant with her dialysis.  Patient has been sick over the last few days what fevers of 101102.  She has been coughing and feeling somewhat short of breath.  She has felt weaker than usual.  Patient was started on antibiotics by her doctor.  Home Medications Prior to Admission medications   Medication Sig Start Date End Date Taking? Authorizing Provider  acetaminophen (TYLENOL) 500 MG tablet Take 500 mg by mouth every 6 (six) hours as needed for mild pain or moderate pain.    [provider]  aspirin 81 MG chewable tablet Chew 81 mg by mouth daily.    [provider]  ferric citrate (AURYXIA) 1 GM 210 MG(Fe) tablet Take 210 mg by mouth 3 (three) times daily with meals.    [provider]  ferrous sulfate 325 (65 FE) MG tablet Take 325 mg by mouth daily with breakfast.    [provider]  hydrALAZINE (APRESOLINE) 25 MG tablet Take 1 tablet (25 mg total) by mouth 3 (three) times daily. 11/17/21   Marylu Lund., NP  metoprolol succinate (TOPROL XL) 25 MG 24 hr tablet Take 1 tablet (25 mg total) by mouth daily. Patient not taking: Reported on 10/25/2021 10/08/21 11/07/21  Zola Button, MD  metoprolol tartrate (LOPRESSOR) 25 MG tablet Take 25 mg by mouth 2 (two) times daily.    [provider]  Multiple Vitamin (MULTIVITAMIN WITH MINERALS) TABS tablet Take 1 tablet by mouth daily.    [provider]      Allergies    Bee venom, Shellfish-derived products,  Erythromycin, Penicillins, Chlorhexidine, and Iodine    Review of Systems   Review of Systems  Respiratory:  Positive for shortness of breath.     Physical Exam Updated Vital Signs BP 136/76   Pulse 90   Temp (!) 97.3 F (36.3 C)   Resp (!) 25   SpO2 100%  Physical Exam Vitals and nursing note reviewed.  Constitutional:      Appearance: She is well-developed. She is not diaphoretic.  HENT:     Head: Normocephalic and atraumatic.     Right Ear: External ear normal.     Left Ear: External ear normal.  Eyes:     General: No scleral icterus.       Right eye: No discharge.        Left eye: No discharge.     Conjunctiva/sclera: Conjunctivae normal.  Neck:     Trachea: No tracheal deviation.  Cardiovascular:     Rate and Rhythm: Normal rate and regular rhythm.  Pulmonary:     Effort: Pulmonary effort is normal. No respiratory distress.     Breath sounds: No stridor. Examination of the left-lower field reveals rales. Rales present. No wheezing.  Abdominal:     General: Bowel sounds are normal. There is no distension.     Palpations: Abdomen is soft.     Tenderness: There is no abdominal tenderness. There is no guarding or  rebound.  Musculoskeletal:        General: No tenderness or deformity.     Cervical back: Neck supple.  Skin:    General: Skin is warm and dry.     Findings: No rash.  Neurological:     General: No focal deficit present.     Mental Status: She is alert.     Cranial Nerves: No cranial nerve deficit, dysarthria or facial asymmetry.     Sensory: No sensory deficit.     Motor: No abnormal muscle tone or seizure activity.     Coordination: Coordination normal.  Psychiatric:        Mood and Affect: Mood normal.     ED Results / Procedures / Treatments   Labs (all labs ordered are listed, but only abnormal results are displayed) Labs Reviewed  CBC - Abnormal; Notable for the following components:      Result Value   MCHC 29.8 (*)    RDW 15.9 (*)     nRBC 0.4 (*)    All other components within normal limits  RESP PANEL BY RT-PCR (FLU A&B, COVID) ARPGX2  CULTURE, BLOOD (ROUTINE X 2)  CULTURE, BLOOD (ROUTINE X 2)  BASIC METABOLIC PANEL  LACTIC ACID, PLASMA  LACTIC ACID, PLASMA    EKG None  Radiology DG Chest 2 View  Result Date: 12/10/2021 CLINICAL DATA:  Shortness of breath for 1 week, initial encounter EXAM: CHEST - 2 VIEW COMPARISON:  10/05/2021 FINDINGS: Cardiac shadow is mildly enlarged. Lungs are clear bilaterally. No focal infiltrate or sizable effusion is seen. Previously noted pulmonary edema has resolved. No bony abnormality is noted. IMPRESSION: Acute abnormality noted. Electronically Signed   By: Inez Catalina M.D.   On: 12/10/2021 20:05    Procedures Procedures    Medications Ordered in ED Medications - No data to display  ED Course/ Medical Decision Making/ A&P Clinical Course as of 12/10/21 2332  Fri Dec 10, 2021  2018 Chest x-ray without acute abnormality [JK]  2019 CBC(!) Normal [JK]  2326 Covid flu negative [JK]    Clinical Course User Index [JK] Dorie Rank, MD                           Medical Decision Making Amount and/or Complexity of Data Reviewed Labs:  Decision-making details documented in ED Course.   She presented ED for evaluation of cough congestion fever.  Patient recently started on oral antibiotics.  X-ray today does not show any acute pneumonia.  Patient does not have an oxygen requirement.  CBC unremarkable.  Metabolic panel and lactic acid level are currently pending.   Care turned over to Dr Loma Sousa at shift change.  Anticipate dc if labs are unremarkable.        Final Clinical Impression(s) / ED Diagnoses Final diagnoses:  None    Rx / DC Orders ED Discharge Orders     None         Dorie Rank, MD 12/10/21 2332

## 2021-12-10 NOTE — ED Triage Notes (Signed)
BIB EMS from home.  Lives with daughter.  Sick for the last 3-4 days. Went to doctor and had chest x-ray and was started on antibiotics.  Hx of CHF.  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  Exertional shob.  Decrease in mobility this week.  BP 146/82, HR 104, RR 20

## 2021-12-10 NOTE — ED Provider Triage Note (Signed)
Emergency Medicine Provider Triage Evaluation Note  Barbara Crawford , a 66 y.o. female  was evaluated in triage.  Pt complains of shortness of breath.  Patient reports she has been short of breath the last 1 week.  The patient states that she was seen originally at her PCPs office, placed on doxycycline and then placed on Levaquin.  Patient reports she had outpatient chest x-ray done today but is unable to tell with the results of this chest x-ray.  Patient Dors is fevers, sore throat.  Patient denies any nausea, vomiting or diarrhea.  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  Review of Systems  Positive:  Negative:   Physical Exam  BP (!) 144/83 (BP Location: Right Arm)   Pulse (!) 105   Temp (!) 97.3 F (36.3 C)   Resp (!) 24   SpO2 96%  Gen:   Awake, no distress   Resp:  Normal effort  MSK:   Moves extremities without difficulty  Other:    Medical Decision Making  Medically screening exam initiated at 7:00 PM.  Appropriate orders placed.  Barbara Crawford was informed that the remainder of the evaluation will be completed by another provider, this initial triage assessment does not replace that evaluation, and the importance of remaining in the ED until their evaluation is complete.     [name], PA-C 12/10/21 1901

## 2021-12-11 ENCOUNTER — Encounter (HOSPITAL_COMMUNITY): Payer: Self-pay | Admitting: Student

## 2021-12-11 ENCOUNTER — Emergency Department (HOSPITAL_COMMUNITY): Payer: Medicare Other

## 2021-12-11 DIAGNOSIS — J189 Pneumonia, unspecified organism: Secondary | ICD-10-CM | POA: Diagnosis not present

## 2021-12-11 LAB — BASIC METABOLIC PANEL
Anion gap: 19 — ABNORMAL HIGH (ref 5–15)
BUN: 44 mg/dL — ABNORMAL HIGH (ref 8–23)
CO2: 20 mmol/L — ABNORMAL LOW (ref 22–32)
Calcium: 8.8 mg/dL — ABNORMAL LOW (ref 8.9–10.3)
Chloride: 99 mmol/L (ref 98–111)
Creatinine, Ser: 5.01 mg/dL — ABNORMAL HIGH (ref 0.44–1.00)
GFR, Estimated: 9 mL/min — ABNORMAL LOW (ref >60)
Glucose, Bld: 58 mg/dL — ABNORMAL LOW (ref 70–99)
Potassium: 5.1 mmol/L (ref 3.5–5.1)
Sodium: 138 mmol/L (ref 135–145)

## 2021-12-11 LAB — LACTIC ACID, PLASMA
Lactic Acid, Venous: 5.8 mmol/L (ref 0.5–1.9)
Lactic Acid, Venous: 2 mmol/L (ref 0.5–1.9)

## 2021-12-11 LAB — HEPATITIS B SURFACE ANTIGEN: Hepatitis B Surface Ag: NONREACTIVE

## 2021-12-11 LAB — CBG MONITORING, ED: Glucose-Capillary: 88 mg/dL (ref 70–99)

## 2021-12-11 MED ORDER — HEPARIN SODIUM (PORCINE) 5000 UNIT/ML IJ SOLN
5000.0000 [IU] | Freq: Three times a day (TID) | INTRAMUSCULAR | Status: DC
  Administered 2021-12-11 – 2021-12-13 (×5): 5000 [IU] via SUBCUTANEOUS
  Filled 2021-12-11 (×5): qty 1

## 2021-12-11 MED ORDER — ASPIRIN 81 MG PO CHEW
81.0000 mg | CHEWABLE_TABLET | Freq: Every day | ORAL | Status: DC
  Administered 2021-12-12 – 2021-12-13 (×2): 81 mg via ORAL
  Filled 2021-12-11 (×2): qty 1

## 2021-12-11 MED ORDER — METOPROLOL TARTRATE 25 MG PO TABS
25.0000 mg | ORAL_TABLET | Freq: Two times a day (BID) | ORAL | Status: DC
  Filled 2021-12-11 (×3): qty 1

## 2021-12-11 MED ORDER — ACETAMINOPHEN 500 MG PO TABS: 500.0000 mg | ORAL_TABLET | Freq: Four times a day (QID) | ORAL | Status: DC | PRN

## 2021-12-11 MED ORDER — SODIUM CHLORIDE 0.9 % IV SOLN
1.0000 g | Freq: Once | INTRAVENOUS | Status: AC
  Administered 2021-12-11: 1 g via INTRAVENOUS
  Filled 2021-12-11: qty 10

## 2021-12-11 MED ORDER — SODIUM CHLORIDE 0.9 % IV SOLN
100.0000 mg | Freq: Once | INTRAVENOUS | Status: AC
  Administered 2021-12-11: 100 mg via INTRAVENOUS
  Filled 2021-12-11: qty 100

## 2021-12-11 MED ORDER — PROSOURCE PLUS PO LIQD
30.0000 mL | Freq: Two times a day (BID) | ORAL | Status: DC
  Filled 2021-12-11 (×3): qty 30

## 2021-12-11 MED ORDER — ADULT MULTIVITAMIN W/MINERALS CH
1.0000 | ORAL_TABLET | Freq: Every day | ORAL | Status: DC
  Administered 2021-12-12: 1 via ORAL
  Filled 2021-12-11: qty 1

## 2021-12-11 MED ORDER — FERROUS SULFATE 325 (65 FE) MG PO TABS
325.0000 mg | ORAL_TABLET | Freq: Every day | ORAL | Status: DC
  Filled 2021-12-11 (×2): qty 1

## 2021-12-11 NOTE — ED Notes (Signed)
Consent for dialysis obtained.

## 2021-12-11 NOTE — Assessment & Plan Note (Addendum)
Vital signs stable, productive cough. Was placed on [place] during dialysis, but it does not appear she had a true desaturation warranting this. No tachypnea this AM. It seems she would be able to successfully transition to oral abx if can wean to RA. Has only received one dose of IV abx 12/09.  - Wean to RA  - Will need a transition to oral abx  - Guaifenesin for cough - IS  - Consider sputum cx if worsening

## 2021-12-11 NOTE — Plan of Care (Signed)
  Problem: Clinical Measurements: Goal: Cardiovascular complication will be avoided Outcome: Progressing   Problem: Clinical Measurements: Goal: Respiratory complications will improve Outcome: Not Progressing   Problem: Activity: Goal: Risk for activity intolerance will decrease Outcome: Not Progressing   

## 2021-12-11 NOTE — ED Notes (Signed)
Tiara (daughter) would like a status update on pt. Phone is [phone]

## 2021-12-11 NOTE — Consult Note (Signed)
[facility] Renal Consultation Note    Indication for Consultation:  Management of ESRD/hemodialysis, anemia, hypertension/volume, and secondary hyperparathyroidism. PCP:  HPI: Barbara Crawford is a 66 y.o. female with ESRD, HTN, HFrEF (EF 25%) who has been admitted with pneumonia.   Reports that symptoms started approximately 1 week ago - started with cough and mild dyspnea, then fever/chills and weakness, progressive throughout the week. Home COVID test was negative. She has also had low appetite. Her PCP had started her on PO doxycycline, says that it gave her a headache so unclear exactly how much she took. Presented to ED last night. Vitals were initially stable. Labs with Na 138, K 5.1, CO2 20, Ca 8.8, WBC 7, Hgb 12.2, LA 5.8 (?error) -> 2 on repeat. CXR clear, but ended up having chest CT which showed multifocal pneumonia. She was started on IV antibiotics and admitted.  Seen in ED bed this AM. She is feeling a little better, appetite has returned and she is very hungry - RN aware. Denies CP, abdominal pain, N/V/D. Afebrile today, still with productive cough.  Dialyzes on TTS schedule at [facility]. Last HD Thurs 12/8. She is due for HD today. No recent dialysis or AVG issues.  Past Medical History:  Diagnosis Date   Anxiety    ESRD (end stage renal disease) on dialysis ([place])    Heart failure with mildly reduced ejection fraction (HFmrEF) (HCC)    Hypertension    Past Surgical History:  Procedure Laterality Date   AV FISTULA PLACEMENT Left 03/13/2020   Procedure: Creation of Left Brachiocephalic fistula;  Surgeon: Marty Heck, MD;  Location: [facility];  Service: Vascular;  Laterality: Left;   IR FLUORO GUIDE CV LINE RIGHT  02/26/2020   IR FLUORO GUIDE CV LINE RIGHT  03/09/2020   IR US GUIDE VASC ACCESS RIGHT  02/26/2020   IR US GUIDE VASC ACCESS RIGHT  03/09/2020   RIGHT/LEFT HEART CATH AND CORONARY ANGIOGRAPHY N/A 10/07/2021   Procedure: RIGHT/LEFT HEART  CATH AND CORONARY ANGIOGRAPHY;  Surgeon: Martinique, Peter M, MD;  Location: [facility];  Service: Cardiovascular;  Laterality: N/A;   Family History  Problem Relation Age of Onset   Dementia Mother    Diabetes Sister    Hypertension Son    Social History:  reports that she has never smoked. She has never used smokeless tobacco. She reports that she does not currently use alcohol. She reports that she does not currently use drugs after having used the following drugs: Marijuana.  ROS: As per HPI otherwise negative.  Physical Exam: Vitals:   12/11/21 1100 12/11/21 1108 12/11/21 1208 12/11/21 1230  BP: 125/73  130/77 133/75  Pulse: 86  85 88  Resp: (!) 26  (!) 30 (!) 26  Temp:  (!) 97.5 F (36.4 C)    TempSrc:  Oral    SpO2: 95%  100% 100%     General: Well developed, well nourished, in no acute distress. Nasal O2 in place. Head: Normocephalic, atraumatic, sclera non-icteric, mucus membranes are moist. Neck: Supple without lymphadenopathy/masses. JVD not elevated. Lungs: Diffuse wheezing/rhonchi throughout all lung fields Heart: RRR with normal S1, S2. No murmurs, rubs, or gallops appreciated. Abdomen: Soft, non-tender, non-distended with normoactive bowel sounds.  Musculoskeletal:  Strength and tone appear normal for age. Lower extremities: No edema or ischemic changes, no open wounds. Neuro: Alert and oriented X 3. Moves all extremities spontaneously. Psych:  Responds to questions appropriately with a normal affect.  Dialysis Access: L AVG + bruit  Allergies  Allergen Reactions   Bee Venom Anaphylaxis   Shellfish-Derived Products Anaphylaxis, Swelling and Other (See Comments)    Sweat and Dizzy. Lobster    Eggs Or Egg-Derived Products Nausea Only   Erythromycin Nausea Only and Other (See Comments)    Cramps    Chlorhexidine Rash   Iodine Rash and Other (See Comments)    Arm flares up per pt   Penicillins Rash    Has patient had a PCN reaction causing immediate rash,  facial/tongue/throat swelling, SOB or lightheadedness with hypotension: no Has patient had a PCN reaction causing severe rash involving mucus membranes or skin necrosis: yes Has patient had a PCN reaction that required hospitalization: no Has patient had a PCN reaction occurring within the last 10 years: no If all of the above answers are "NO", then may proceed with Cephalosporin use.   Prior to Admission medications   Medication Sig Start Date End Date Taking? Authorizing Provider  acetaminophen (TYLENOL) 500 MG tablet Take 500 mg by mouth as needed for mild pain, moderate pain or fever.   Yes [provider]  aspirin 81 MG chewable tablet Chew 81 mg by mouth daily.   Yes [provider]  Dextromethorphan HBr (DELSYM PO) Take 10 mLs by mouth every 12 (twelve) hours as needed (cough).   Yes [provider]  docusate sodium (COLACE) 100 MG capsule Take 100 mg by mouth daily.   Yes [provider]  ferrous sulfate 325 (65 FE) MG tablet Take 325 mg by mouth in the morning and at bedtime.   Yes [provider]  levofloxacin (LEVAQUIN) 250 MG tablet Take 250 mg by mouth daily. 12/10/21  Yes [provider]  LORazepam (ATIVAN) 0.5 MG tablet Take 0.5 mg by mouth as needed for anxiety. 10/18/21  Yes [provider]  metoprolol tartrate (LOPRESSOR) 25 MG tablet Take 25 mg by mouth 2 (two) times daily.   Yes [provider]  multivitamin (RENA-VIT) TABS tablet Take 1 tablet by mouth at bedtime.   Yes [provider]  hydrALAZINE (APRESOLINE) 25 MG tablet Take 1 tablet (25 mg total) by mouth 3 (three) times daily. Patient not taking: Reported on 12/11/2021 11/17/21   Marylu Lund., NP  metoprolol succinate (TOPROL XL) 25 MG 24 hr tablet Take 1 tablet (25 mg total) by mouth daily. Patient not taking: Reported on 12/11/2021 10/08/21 12/11/21  Zola Button, MD   Current Facility-Administered Medications  Medication Dose Route  Frequency Provider Last Rate Last Admin   acetaminophen (TYLENOL) tablet 500 mg  500 mg Oral Q6H PRN Salvadore Oxford, MD       aspirin chewable tablet 81 mg  81 mg Oral Daily Salvadore Oxford, MD       doxycycline (VIBRAMYCIN) 100 mg in sodium chloride 0.9 % 250 mL IVPB  100 mg Intravenous Once Salvadore Oxford, MD 125 mL/hr at 12/11/21 1107 100 mg at 12/11/21 1107   [START ON 12/12/2021] ferrous sulfate tablet 325 mg  325 mg Oral Q breakfast Salvadore Oxford, MD       heparin injection 5,000 Units  5,000 Units Subcutaneous Q8H Salvadore Oxford, MD       metoprolol tartrate (LOPRESSOR) tablet 25 mg  25 mg Oral BID Salvadore Oxford, MD       multivitamin with minerals tablet 1 tablet  1 tablet Oral Daily Salvadore Oxford, MD       Current Outpatient Medications  Medication Sig  Dispense Refill   acetaminophen (TYLENOL) 500 MG tablet Take 500 mg by mouth as needed for mild pain, moderate pain or fever.     aspirin 81 MG chewable tablet Chew 81 mg by mouth daily.     Dextromethorphan HBr (DELSYM PO) Take 10 mLs by mouth every 12 (twelve) hours as needed (cough).     docusate sodium (COLACE) 100 MG capsule Take 100 mg by mouth daily.     ferrous sulfate 325 (65 FE) MG tablet Take 325 mg by mouth in the morning and at bedtime.     levofloxacin (LEVAQUIN) 250 MG tablet Take 250 mg by mouth daily.     LORazepam (ATIVAN) 0.5 MG tablet Take 0.5 mg by mouth as needed for anxiety.     metoprolol tartrate (LOPRESSOR) 25 MG tablet Take 25 mg by mouth 2 (two) times daily.     multivitamin (RENA-VIT) TABS tablet Take 1 tablet by mouth at bedtime.     hydrALAZINE (APRESOLINE) 25 MG tablet Take 1 tablet (25 mg total) by mouth 3 (three) times daily. (Patient not taking: Reported on 12/11/2021) 90 tablet 3   metoprolol succinate (TOPROL XL) 25 MG 24 hr tablet Take 1 tablet (25 mg total) by mouth daily. (Patient not taking: Reported on 12/11/2021) 30 tablet 0   Labs: Basic Metabolic Panel: Recent Labs  Lab  12/10/21 1919  NA 138  K 5.1  CL 99  CO2 20*  GLUCOSE 58*  BUN 44*  CREATININE 5.01*  CALCIUM 8.8*   CBC: Recent Labs  Lab 12/10/21 1919  WBC 7.0  HGB 12.2  HCT 41.0  MCV 95.1  PLT 205   Studies/Results: CT Chest Wo Contrast  Result Date: 12/11/2021 CLINICAL DATA:  Respiratory illness. EXAM: CT CHEST WITHOUT CONTRAST TECHNIQUE: Multidetector CT imaging of the chest was performed following the standard protocol without IV contrast. RADIATION DOSE REDUCTION: This exam was performed according to the departmental dose-optimization program which includes automated exposure control, adjustment of the mA and/or kV according to patient size and/or use of iterative reconstruction technique. COMPARISON:  Chest x-ray 12/10/2021.  CT chest 03/17/2020. FINDINGS: Cardiovascular: The heart size is normal. The heart is markedly enlarged. No substantial pericardial effusion. Coronary artery calcification is evident. Mild atherosclerotic calcification is noted in the wall of the thoracic aorta. Mediastinum/Nodes: No mediastinal lymphadenopathy. No evidence for gross hilar lymphadenopathy although assessment is limited by the lack of intravenous contrast on the current study. The esophagus has normal imaging features. There is no axillary lymphadenopathy. Lungs/Pleura: There is diffuse central predominant tree-in-bud ground-glass opacity in all lobes of both lungs with nodular components in the right upper lobe measuring up to 5 mm (image 46/4). Consolidative opacity with volume loss noted left lower lobe (105/4). No evidence for pulmonary edema or pleural effusion. Upper Abdomen: Apparent wall thickening in the stomach is likely related to underdistention. Kidneys are atrophic bilaterally. Musculoskeletal: Diffuse body wall edema evident. No worrisome lytic or sclerotic osseous abnormality. IMPRESSION: 1. Diffuse central predominant tree-in-bud ground-glass opacity in all lobes of both lungs with nodular  components in the right upper lobe measuring up to 5 mm. Imaging features are most suggestive of an infectious/inflammatory etiology including atypical etiology. Follow-up CT chest in 3 months recommended to assess for resolution. 2. Consolidative opacity with volume loss left lower lobe, likely atelectasis although pneumonia not excluded. 3. Marked cardiomegaly with coronary artery atherosclerosis. 4. Diffuse body wall edema. 5.  Aortic Atherosclerosis (ICD10-I70.0). Electronically Signed   By: Misty Stanley  M.D.   On: 12/11/2021 07:55   DG Chest 2 View  Result Date: 12/10/2021 CLINICAL DATA:  Shortness of breath for 1 week, initial encounter EXAM: CHEST - 2 VIEW COMPARISON:  10/05/2021 FINDINGS: Cardiac shadow is mildly enlarged. Lungs are clear bilaterally. No focal infiltrate or sizable effusion is seen. Previously noted pulmonary edema has resolved. No bony abnormality is noted. IMPRESSION: Acute abnormality noted. Electronically Signed   By: Inez Catalina M.D.   On: 12/10/2021 20:05    Dialysis Orders:  TTS at [facility] 3:30hr, L AVF  Assessment/Plan:  HCAP: Started on IV doxycycline/ceftriaxone.  ESRD: Usual TTS schedule -> due for HD today, will order.  Hypertension/volume: BP controlled, no edema on exam. UF as tolerated.  Anemia: Hgb > 12, no ESA needed.  Metabolic bone disease: Ca ok, Phos pending. Continue home meds.  Nutrition: Will add protein supplement.  HFrEF (25%)  Veneta Penton, PA-C 12/11/2021, 12:51 PM  [facility]

## 2021-12-11 NOTE — Progress Notes (Signed)
FMTS Brief Progress Note  S: Patient sitting up in bed, denies any questions or concerns at this time, denies shortness of breath.   O: BP 103/73 (BP Location: Right Arm)   Pulse 93   Temp 97.6 F (36.4 C) (Oral)   Resp 18   Ht 5\' 3"  (1.6 m)   Wt 48.4 kg   SpO2 100%   BMI 18.90 kg/m   General: Pleasant speak with, sitting up in bed Cardio: Well-perfused Respiratory: Breathing comfortably on room air  A/P: Multifocal pneumonia Normal work of breathing on room air.  S/p ceftriaxone and IV doxycycline, can consider transitioning to oral abx in the morning. - Orders reviewed. Labs for AM ordered, which was adjusted as needed.    Precious Gilding, DO 12/11/2021, 9:37 PM PGY-2, [facility] Night Resident  Please page [phone] with questions.

## 2021-12-11 NOTE — Assessment & Plan Note (Addendum)
Regular dialysis schedule -Nephro consulted, recs appreciated -Dialysis Tuesday Thursday Saturday

## 2021-12-11 NOTE — H&P (Addendum)
Hospital Admission History and Physical Service Pager: [phone]  Patient name: Barbara Crawford Medical record number: [id number] Date of Birth: 06/02/1955 Age: 66 y.o. Gender: female  Primary Care Provider: Andria Frames, PA-C Consultants: None Code Status: FULL Preferred Emergency Contact:  Contact Information     Name Relation Home Work Mobile   Leland Daughter   [phone]   Haskell Riling   [phone]   Alpha Gula   [phone]   Scott,Marcus Son   [phone]        Chief Complaint: shortness of breath  Assessment and Plan: Barbara Crawford is a 66 y.o. 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 Differential for this patient's presentation of this includes pneumonia, volume overload, PE.  Likely pneumonia in the setting of fevers at home dyspnea, productive cough, corresponding CT findings correspond.  Volume overload and PE unlikely without clinical findings on exam (patient has not missed dialysis) and a Well's score of 0.  * Multifocal pneumonia Vital signs stable except for slightly elevated respiratory rate.  No increased work of breathing. Fortunately, no leukocytosis. CT scan consistent with multifocal pneumonia. S/p first dose of CTX and IV doxy given QT prolongation and nausea to erythromycin.  -Admit to family medicine teaching service, attending Dr. Ardelia Mems, [place] -Continue abx, consider transitioning to oral if progressing well -Vitals per floor monitoring -Incentive spirometry -PT/OT eval and treat -AM CBC, BMP  ESRD (end stage renal disease) ([place]) Patient's vitals are stable and does not appear volume overloaded on exam.  Plan for regular dialysis schedule. No need for emergent dialysis at this time.  -Nephro consulted, recs appreciated -Dialysis Tuesday Thursday Saturday  Chronic  Stable Conditions HFmrEF/HTN - continue Lopressor 25 mg twice daily Anemia - continue ferrous sulfate 325 mg daily CAD - continue aspirin 81 mg daily  FEN/GI: Renal diet VTE Prophylaxis: Heparin subQ  Disposition: MedSurg  History of Present Illness:  Barbara Crawford is a 66 y.o. female presenting with shortness of breath.   Notes that for the whole week, she has not been feeling well. On Tuesday at dialysis, she was cold and had chills. COVID test at home was negative. Wednesday, she called her primary doctor, and was given antibiotics. She took it for 2 days, didn't make her feel "right". On Thursday at dialysis, she had a fever (103). Stayed home Friday, was given Levaquin by her PCP (via telehealth) and laid down. She walked with her walker to the bedroom, and began feeling short of breath which resolved briefly. She then continued to feel short of breath overnight. Endorses coughing up deep-yellow phlegm (no blood), poor appetite. Denies n/v/d, chest pain, headaches, blurry vision. She has been able to sit through dialysis appropriately. She has only taken Tylenol for fever.   In the ED, patient presented with elevated respiratory rate, and normal saturations on room air and stable vitals. Lactic acid elevated to 2.0. CT scan concerning for multilobular pneumonia. Patient started on IV doxycycline and ceftriaxone.  Review Of Systems: Per HPI.  Pertinent Past Medical History: Anxiety, ESRD on HD (TTS), HFmrEF, HTN, CAD Remainder reviewed in history tab.   Pertinent Past Surgical History: R/L heart cath, AV fistula placement  Remainder reviewed in history tab.  Pertinent Social History: Tobacco use: Never Alcohol use: Seldom wine, not in the last few weeks Other Substance use: marijuana years ago Lives with daughter  at home  Pertinent Family History: Dementia - Mother  Remainder reviewed in history tab.   Important Outpatient Medications: Tylenol prn ASA 81mg   daily Metoprolol tartrate 25mg  BID Ferrous Sulfate 325mg  daily MVI Remainder reviewed in medication history.   Objective: BP 116/69 (BP Location: Right Arm)   Pulse 88   Temp 98 F (36.7 C) (Oral)   Resp 20   Ht 5\' 3"  (1.6 m)   Wt 48.4 kg   SpO2 100%   BMI 18.90 kg/m  Exam: General: A&O, NAD, lying comfortably in hospital bed HEENT: No sign of trauma, EOM grossly intact, moist mucous membranes Cardiac: RRR, no m/r/g Respiratory: CTAB, normal WOB on room air, crackles heard throughout bilateral upper and lower lung fields GI: Soft, NTTP, non-distended, no rebound or guarding Extremities: NTTP, no peripheral edema. Neuro: Moves all four extremities appropriately. Psych: Appropriate mood and affect  Labs:  CBC BMET  Recent Labs  Lab 12/10/21 1919  WBC 7.0  HGB 12.2  HCT 41.0  PLT 205   Recent Labs  Lab 12/10/21 1919  NA 138  K 5.1  CL 99  CO2 20*  BUN 44*  CREATININE 5.01*  GLUCOSE 58*  CALCIUM 8.8*    Pertinent additional labs:  - LA: 2.0 <-- 5.8   EKG: My own interpretation (not copied from electronic read): Qtc prolonged at 502, sinus rhythm, no ST elevation, right axis deviation    Imaging Studies Performed:  CT chest without contrast 12/11/2021 IMPRESSION: 1. Diffuse central predominant tree-in-bud ground-glass opacity in all lobes of both lungs with nodular components in the right upper lobe measuring up to 5 mm. Imaging features are most suggestive of an infectious/inflammatory etiology including atypical etiology. Follow-up CT chest in 3 months recommended to assess for resolution. 2. Consolidative opacity with volume loss left lower lobe, likely atelectasis although pneumonia not excluded. 3. Marked cardiomegaly with coronary artery atherosclerosis. 4. Diffuse body wall edema. 5.  Aortic Atherosclerosis (ICD10-I70.0).  Salvadore Oxford, MD 12/11/2021, 5:22 PM PGY-1, [place] Intern pager: [phone], text pages  welcome Secure chat group [place]   I was personally present and performed or re-performed the history, physical exam and medical decision making activities of this service and have verified that the service and findings are accurately documented in the intern's note.  Wells Guiles, DO                  12/11/2021, 5:44 PM

## 2021-12-11 NOTE — ED Provider Notes (Signed)
Patient with URI symptoms. On Levaquin but has prolonged QTC. CT chest to see if pneumonia.  CT shows likely atypical infection and probably some lower lobe pneumonia.  Could be atelectasis but based on presentation I am worried it is pneumonia.  I personally viewed/interpreted these images.  Given she does not seem to be improving and her complicated medical history including dialysis, I think she will need admission.  She does have prolonged QTc so we will try IV doxycycline as azithromycin would be ideal.  Will also give IV Rocephin.  Will admit to family practice.   Sherwood Gambler, MD 12/11/21 [phone]

## 2021-12-12 DIAGNOSIS — I5022 Chronic systolic (congestive) heart failure: Secondary | ICD-10-CM | POA: Diagnosis present

## 2021-12-12 DIAGNOSIS — I132 Hypertensive heart and chronic kidney disease with heart failure and with stage 5 chronic kidney disease, or end stage renal disease: Secondary | ICD-10-CM | POA: Diagnosis present

## 2021-12-12 DIAGNOSIS — Z91030 Bee allergy status: Secondary | ICD-10-CM | POA: Diagnosis not present

## 2021-12-12 DIAGNOSIS — I959 Hypotension, unspecified: Secondary | ICD-10-CM | POA: Diagnosis present

## 2021-12-12 DIAGNOSIS — I2510 Atherosclerotic heart disease of native coronary artery without angina pectoris: Secondary | ICD-10-CM | POA: Diagnosis present

## 2021-12-12 DIAGNOSIS — Z888 Allergy status to other drugs, medicaments and biological substances status: Secondary | ICD-10-CM | POA: Diagnosis not present

## 2021-12-12 DIAGNOSIS — R9431 Abnormal electrocardiogram [ECG] [EKG]: Secondary | ICD-10-CM | POA: Diagnosis present

## 2021-12-12 DIAGNOSIS — E8889 Other specified metabolic disorders: Secondary | ICD-10-CM | POA: Diagnosis present

## 2021-12-12 DIAGNOSIS — Z992 Dependence on renal dialysis: Secondary | ICD-10-CM | POA: Diagnosis not present

## 2021-12-12 DIAGNOSIS — Z881 Allergy status to other antibiotic agents status: Secondary | ICD-10-CM | POA: Diagnosis not present

## 2021-12-12 DIAGNOSIS — Z1152 Encounter for screening for COVID-19: Secondary | ICD-10-CM | POA: Diagnosis not present

## 2021-12-12 DIAGNOSIS — J189 Pneumonia, unspecified organism: Secondary | ICD-10-CM | POA: Diagnosis present

## 2021-12-12 DIAGNOSIS — N2581 Secondary hyperparathyroidism of renal origin: Secondary | ICD-10-CM | POA: Diagnosis present

## 2021-12-12 DIAGNOSIS — Z91012 Allergy to eggs: Secondary | ICD-10-CM | POA: Diagnosis not present

## 2021-12-12 DIAGNOSIS — N186 End stage renal disease: Secondary | ICD-10-CM | POA: Diagnosis present

## 2021-12-12 DIAGNOSIS — Z833 Family history of diabetes mellitus: Secondary | ICD-10-CM | POA: Diagnosis not present

## 2021-12-12 DIAGNOSIS — Y95 Nosocomial condition: Secondary | ICD-10-CM | POA: Diagnosis present

## 2021-12-12 DIAGNOSIS — Z91013 Allergy to seafood: Secondary | ICD-10-CM | POA: Diagnosis not present

## 2021-12-12 DIAGNOSIS — F419 Anxiety disorder, unspecified: Secondary | ICD-10-CM | POA: Diagnosis present

## 2021-12-12 DIAGNOSIS — Z880 Allergy status to penicillin: Secondary | ICD-10-CM | POA: Diagnosis not present

## 2021-12-12 DIAGNOSIS — Z8249 Family history of ischemic heart disease and other diseases of the circulatory system: Secondary | ICD-10-CM | POA: Diagnosis not present

## 2021-12-12 DIAGNOSIS — D631 Anemia in chronic kidney disease: Secondary | ICD-10-CM | POA: Diagnosis present

## 2021-12-12 LAB — RENAL FUNCTION PANEL
Albumin: 2.3 g/dL — ABNORMAL LOW (ref 3.5–5.0)
Anion gap: 13 (ref 5–15)
BUN: 29 mg/dL — ABNORMAL HIGH (ref 8–23)
CO2: 28 mmol/L (ref 22–32)
Calcium: 7.7 mg/dL — ABNORMAL LOW (ref 8.9–10.3)
Chloride: 96 mmol/L — ABNORMAL LOW (ref 98–111)
Creatinine, Ser: 5.64 mg/dL — ABNORMAL HIGH (ref 0.44–1.00)
GFR, Estimated: 8 mL/min — ABNORMAL LOW (ref >60)
Glucose, Bld: 117 mg/dL — ABNORMAL HIGH (ref 70–99)
Phosphorus: 3.1 mg/dL (ref 2.5–4.6)
Potassium: 3.5 mmol/L (ref 3.5–5.1)
Sodium: 137 mmol/L (ref 135–145)

## 2021-12-12 LAB — CBC
HCT: 37.1 % (ref 36.0–46.0)
Hemoglobin: 11.9 g/dL — ABNORMAL LOW (ref 12.0–15.0)
MCH: 29 pg (ref 26.0–34.0)
MCHC: 32.1 g/dL (ref 30.0–36.0)
MCV: 90.5 fL (ref 80.0–100.0)
Platelets: 176 10*3/uL (ref 150–400)
RBC: 4.1 MIL/uL (ref 3.87–5.11)
RDW: 15.9 % — ABNORMAL HIGH (ref 11.5–15.5)
WBC: 7.2 10*3/uL (ref 4.0–10.5)
nRBC: 0.4 % — ABNORMAL HIGH (ref 0.0–0.2)

## 2021-12-12 MED ORDER — SODIUM CHLORIDE 0.9 % IV SOLN
2.0000 g | INTRAVENOUS | Status: DC
  Administered 2021-12-12 – 2021-12-13 (×2): 2 g via INTRAVENOUS
  Filled 2021-12-12 (×2): qty 20

## 2021-12-12 MED ORDER — SODIUM CHLORIDE 0.9 % IV SOLN
100.0000 mg | Freq: Two times a day (BID) | INTRAVENOUS | Status: DC
  Administered 2021-12-12 – 2021-12-13 (×3): 100 mg via INTRAVENOUS
  Filled 2021-12-12 (×5): qty 100

## 2021-12-12 MED ORDER — GUAIFENESIN 200 MG PO TABS
200.0000 mg | ORAL_TABLET | ORAL | Status: DC | PRN
  Administered 2021-12-12 (×2): 200 mg via ORAL
  Filled 2021-12-12 (×3): qty 1

## 2021-12-12 NOTE — Progress Notes (Signed)
Received patient in bed to unit.  Alert and oriented.  Informed consent signed and in chart.   Treatment initiated: 2239 Treatment completed: 0230  Patient tolerated well.  Transported back to the room  Alert, without acute distress.  Hand-off given to patient's nurse.   Access used: catheter Access issues: none  Total UF removed: 1000 Medication(s) given: none Post HD VS: 97.7 101/60 87 18 100% RN Post HD weight: 48.7kg   [facility]

## 2021-12-12 NOTE — Assessment & Plan Note (Signed)
Hold home Hydralazine, on Metop

## 2021-12-12 NOTE — Evaluation (Signed)
Physical Therapy Evaluation Patient Details Name: Barbara Crawford MRN: [id number] DOB: September 21, 1955 Today's Date: 12/12/2021  History of Present Illness  Barbara Crawford is a 66 y.o. female admitted on 12/10/2021 with pneumonia;  with a history of ESRD on HD, HFmrEF, hypertension, Focal segmental glomerulosclerosis  Clinical Impression   Pt admitted with above diagnosis. Lives at home with adult children, in a single-level home with a flight of steps to enter; Prior to admission, pt was able to manage independently, taking steps slowly; Presents to PT with deconditioning, generalized weakness, fatigue; Walked in hallway, slow cadence, on room air, and O2 sats stayed at or above 93%; Overall pt needing min assist to stand and walk the hallway with bil support on RW;  Pt currently with functional limitations due to the deficits listed below (see PT Problem List). Pt will benefit from skilled PT to increase their independence and safety with mobility to allow discharge to the venue listed below.    Pt must go to HD 3x/week, and the flight of steps to enter her home has the potential to become unsustainable; Discussed ideas for managing the flight of stairs, including adjusting the leg length of a shower chair to allow for her to take a seated rest break in the middle of the flight, if needed; Pt plans to ask her primary care physical for a letter of medical necessity.          Recommendations for follow up therapy are one component of a multi-disciplinary discharge planning process, led by the attending physician.  Recommendations may be updated based on patient status, additional functional criteria and insurance authorization.  Follow Up Recommendations Home health PT      Assistance Recommended at Discharge Set up Supervision/Assistance  Patient can return home with the following  Help with stairs or ramp for entrance    Equipment Recommendations Rollator (4 wheels);Other  (comment) (and shower chair to give pt the option to take a seated rest break if needed in the stairwell)  Recommendations for Other Services       Functional Status Assessment Patient has had a recent decline in their functional status and demonstrates the ability to make significant improvements in function in a reasonable and predictable amount of time.     Precautions / Restrictions Precautions Precautions: None Restrictions Weight Bearing Restrictions: No      Mobility  Bed Mobility                    Transfers Overall transfer level: Needs assistance Equipment used: Rolling walker (2 wheels) Transfers: Sit to/from Stand Sit to Stand: Supervision           General transfer comment: Cues for hand placement and safety    Ambulation/Gait Ambulation/Gait assistance: Supervision Gait Distance (Feet): 180 Feet Assistive device: Rolling walker (2 wheels), Rollator (4 wheels) Gait Pattern/deviations: Step-through pattern, Decreased step length - right, Decreased step length - left       General Gait Details: very slow cadence; cues to self-monitor for activity tolerance; Walked on room air and lowest observed O2 sat was 93%; R notified  Stairs            Wheelchair Mobility    Modified Rankin (Stroke Patients Only)       Balance Overall balance assessment: Mild deficits observed, not formally tested  Pertinent Vitals/Pain Pain Assessment Pain Assessment: No/denies pain    Home Living Family/patient expects to be discharged to:: Private residence Living Arrangements: Children Available Help at Discharge: Family;Available PRN/intermittently Type of Home: Apartment Home Access: Stairs to enter Entrance Stairs-Rails: Left Entrance Stairs-Number of Steps: flight   Home Layout: One level Home Equipment: Cane - Holiday representative (2 wheels);BSC/3in1      Prior Function Prior Level of  Function : Independent/Modified Independent;Working/employed;Driving             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       Hand Dominance   Dominant Hand: Right    Extremity/Trunk Assessment   Upper Extremity Assessment Upper Extremity Assessment: Defer to OT evaluation    Lower Extremity Assessment Lower Extremity Assessment: Generalized weakness       Communication   Communication: No difficulties  Cognition Arousal/Alertness: Awake/alert Behavior During Therapy: WFL for tasks assessed/performed Overall Cognitive Status: Within Functional Limits for tasks assessed                                          General Comments General comments (skin integrity, edema, etc.): Discussed ideas for managing the flight of stairs, including adjusting the leg length of a shower chair to allow for her to take a seated rest break in the middle of the flight, if needed    Exercises     Assessment/Plan    PT Assessment Patient needs continued PT services  PT Problem List Decreased strength;Decreased activity tolerance;Decreased balance;Decreased mobility;Decreased knowledge of use of DME;Decreased safety awareness;Decreased knowledge of precautions;Cardiopulmonary status limiting activity       PT Treatment Interventions DME instruction;Gait training;Stair training;Functional mobility training;Therapeutic activities;Therapeutic exercise;Patient/family education    PT Goals (Current goals can be found in the Care Plan section)  Acute Rehab PT Goals Patient Stated Goal: Wants to be able to manage PT Goal Formulation: With patient Time For Goal Achievement: 12/26/21 Potential to Achieve Goals: Good    Frequency Min 3X/week     Co-evaluation               AM-PAC PT "6 Clicks" Mobility  Outcome Measure Help needed turning from your back to your side while in a flat bed without using bedrails?: A Little Help needed moving from lying on your back to  sitting on the side of a flat bed without using bedrails?: A Little Help needed moving to and from a bed to a chair (including a wheelchair)?: A Little Help needed standing up from a chair using your arms (e.g., wheelchair or bedside chair)?: A Little Help needed to walk in hospital room?: A Little Help needed climbing 3-5 steps with a railing? : A Little 6 Click Score: 18    End of Session Equipment Utilized During Treatment: Oxygen;Other (comment) (O2 sat monitor) Activity Tolerance: Patient tolerated treatment well Patient left: in chair;with call bell/phone within reach Nurse Communication: Mobility status PT Visit Diagnosis: Unsteadiness on feet (R26.81);Other abnormalities of gait and mobility (R26.89);Muscle weakness (generalized) (M62.81)    Time: 1950-9326 PT Time Calculation (min) (ACUTE ONLY): 33 min   Charges:   PT Evaluation $PT Eval Low Complexity: 1 Low PT Treatments $Gait Training: 8-22 mins        Roney Marion, PT  Acute Rehabilitation Services Office [phone]   Colletta [place] 12/12/2021, 2:16 PM

## 2021-12-12 NOTE — Hospital Course (Addendum)
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 Her hospital course is detailed below:  Multifocal pneumonia  Patient presented with dyspnea after failed outpatient oral doxycycline, CT demonstrated multifocal PNA. Patient was treated initially with doxy IV and CTX due to prolonged QT interval seen on EKG. Throughout admission blood cultures were negative. At discharge patient was successfully transitioned to oral antibiotics and in stable respiratory condition.  ESRD TTS Nephrology was consulted on admission.  Patient was continued with regular dialysis schedule.  Other chronic conditions were medically managed with home medications and formulary alternatives as necessary (HFmrEF/HTN/Anemia/CAD)  PCP Follow-up Recommendations: Complete Doxycycline and Cefdinir 300mg  daily on 12/15 Stopped metoprolol on d/c due to softer BPs, revisit at PCP Continue outpatient dialysis and nephrology follow up

## 2021-12-12 NOTE — Progress Notes (Signed)
Pharmacy Antibiotic Note  Barbara Crawford is a 66 y.o. female admitted on 12/10/2021 with pneumonia.  Pharmacy has been consulted for doxycycline and ceftriaxone dosing.  Plan: Ceftriaxone 2 grams iv q24h Doxycycline 100 mg iv q12h  Height: 5\' 3"  (160 cm) Weight: 48.7 kg (107 lb 5.8 oz) IBW/kg (Calculated) : 52.4  Temp (24hrs), Avg:97.8 F (36.6 C), Min:97.5 F (36.4 C), Max:98.1 F (36.7 C)  Recent Labs  Lab 12/10/21 1919 12/11/21 0301  WBC 7.0  --   CREATININE 5.01*  --   LATICACIDVEN 5.8* 2.0*    Estimated Creatinine Clearance: 8.7 mL/min (A) (by C-G formula based on SCr of 5.01 mg/dL (H)).    Allergies  Allergen Reactions   Bee Venom Anaphylaxis   Shellfish-Derived Products Anaphylaxis, Swelling and Other (See Comments)    Sweat and Dizzy. Lobster    Eggs Or Egg-Derived Products Nausea Only   Erythromycin Nausea Only and Other (See Comments)    Cramps    Chlorhexidine Rash   Iodine Rash and Other (See Comments)    Arm flares up per pt   Penicillins Rash    Has patient had a PCN reaction causing immediate rash, facial/tongue/throat swelling, SOB or lightheadedness with hypotension: no Has patient had a PCN reaction causing severe rash involving mucus membranes or skin necrosis: yes Has patient had a PCN reaction that required hospitalization: no Has patient had a PCN reaction occurring within the last 10 years: no If all of the above answers are "NO", then may proceed with Cephalosporin use.    Antimicrobials this admission: Doxy 12/9 >>   CTX 12/9 >>    Dose adjustments this admission: No renal dose adjustments required for doxy and CTX  Microbiology results: 12/8 BCx: ip   Thank you for allowing pharmacy to be a part of this patient's care.  Vaughan Basta BS, PharmD, BCPS Clinical Pharmacist 12/12/2021 7:00 AM  Contact: [phone] after 3 PM  "Be curious, not judgmental..." -Jamal Maes

## 2021-12-12 NOTE — Progress Notes (Signed)
Physical Therapy  Note  (Full PT eval note to follow)  SATURATION QUALIFICATIONS: (This note is used to comply with regulatory documentation for home oxygen)  Patient Saturations on Room Air at Rest = 96%  Patient Saturations on Room Air while Ambulating = 93%   Patient does not require supplemental oxygen to maintain oxygen saturations at acceptable, safe levels with uncomplicated ambulation.  Roney Marion, [place] Office [phone]

## 2021-12-12 NOTE — Evaluation (Addendum)
Occupational Therapy Evaluation Patient Details Name: Avarey Yaeger MRN: [id number] DOB: 05-15-1955 Today's Date: 12/12/2021   History of Present Illness Kenyetta Wimbish is a 66 y.o. female admitted on 12/10/2021 with pneumonia;  with a history of ESRD on HD, HFmrEF, hypertension, Focal segmental glomerulosclerosis   Clinical Impression   66 yo female who is indep with ADLs, mobility, driving and working. She reports decline in her ability to perform self care activities as well as mobilize in and out of her 2nd floor apartment over the last week. She reports feeling like her strength is slowly improving. She is able to perform LB dressing task as well as apply lotion to BLEs with set-up A. She is able to stand with RW with sup. Pt has supportive family at home as well as access to RW. PT arrived to bedside and pt left with PT further assessing mobility. Will follow while in house but does not require follow-up OT once d/c home.      Recommendations for follow up therapy are one component of a multi-disciplinary discharge planning process, led by the attending physician.  Recommendations may be updated based on patient status, additional functional criteria and insurance authorization.   Follow Up Recommendations  No OT follow up     Assistance Recommended at Discharge Intermittent Supervision/Assistance  Patient can return home with the following A little help with walking and/or transfers;A little help with bathing/dressing/bathroom    Functional Status Assessment  Patient has had a recent decline in their functional status and demonstrates the ability to make significant improvements in function in a reasonable and predictable amount of time.  Equipment Recommendations  None recommended by OT       Precautions / Restrictions Precautions Precautions: None Restrictions Weight Bearing Restrictions: No      Mobility Bed Mobility      Received seated in bedside  chair   Transfers Overall transfer level: Needs assistance Equipment used: Rolling walker (2 wheels) Transfers: Sit to/from Stand Sit to Stand: Supervision                      ADL either performed or assessed with clinical judgement   ADL Overall ADL's : Needs assistance/impaired Eating/Feeding: Independent   Grooming: Sitting;Wash/dry hands   Upper Body Bathing: Set up   Lower Body Bathing: Set up   Upper Body Dressing : Set up   Lower Body Dressing: Set up   Toilet Transfer: Min guard   Toileting- Clothing Manipulation and Hygiene: Min guard               Vision Baseline Vision/History: 1 Wears glasses Ability to See in Adequate Light: 0 Adequate Patient Visual Report: No change from baseline Vision Assessment?: No apparent visual deficits            Pertinent Vitals/Pain Pain Assessment Pain Assessment: No/denies pain     Hand Dominance Right   Extremity/Trunk Assessment Upper Extremity Assessment Upper Extremity Assessment: Overall WFL for tasks assessed   Lower Extremity Assessment Lower Extremity Assessment: Defer to PT evaluation       Communication Communication Communication: No difficulties   Cognition Arousal/Alertness: Awake/alert Behavior During Therapy: WFL for tasks assessed/performed Overall Cognitive Status: Within Functional Limits for tasks assessed               General Comments  LE dry skin, likely related to edema resolution            Home Living  Family/patient expects to be discharged to:: Private residence Living Arrangements: Children Available Help at Discharge: Family;Available PRN/intermittently Type of Home: Apartment Home Access: Stairs to enter Entrance Stairs-Number of Steps: flight Entrance Stairs-Rails: Left Home Layout: One level     Bathroom Shower/Tub: Occupational psychologist: Standard     Home Equipment: Cane - Holiday representative (2 wheels);BSC/3in1           Prior Functioning/Environment Prior Level of Function : Independent/Modified Independent;Working/employed;Driving                        OT Problem List: Decreased strength;Decreased activity tolerance      OT Treatment/Interventions: Self-care/ADL training;DME and/or AE instruction    OT Goals(Current goals can be found in the care plan section) Acute Rehab OT Goals Patient Stated Goal: Return to independence with ADLs OT Goal Formulation: With patient Time For Goal Achievement: 12/26/21 Potential to Achieve Goals: Good ADL Goals Pt Will Perform Grooming: Independently;standing Pt Will Transfer to Toilet: with modified independence;regular height toilet Pt Will Perform Toileting - Clothing Manipulation and hygiene: with modified independence  OT Frequency: Min 2X/week       AM-PAC OT "6 Clicks" Daily Activity     Outcome Measure Help from another person eating meals?: None Help from another person taking care of personal grooming?: None Help from another person toileting, which includes using toliet, bedpan, or urinal?: A Little Help from another person bathing (including washing, rinsing, drying)?: A Little Help from another person to put on and taking off regular upper body clothing?: A Little Help from another person to put on and taking off regular lower body clothing?: A Little 6 Click Score: 20   End of Session Equipment Utilized During Treatment: Rolling walker (2 wheels)  Activity Tolerance: Patient tolerated treatment well Patient left: in chair  OT Visit Diagnosis: Muscle weakness (generalized) (M62.81);Unsteadiness on feet (R26.81)                Time: 6803-2122 OT Time Calculation (min): 26 min Charges:  OT General Charges $OT Visit: 1 Visit OT Evaluation $OT Eval Low Complexity: 1 Low OT Treatments $Self Care/Home Management : 8-22 mins   Naomie Dean Ramsie Ostrander, OTR/L 12/12/2021, 12:52 PM

## 2021-12-12 NOTE — Progress Notes (Addendum)
     Daily Progress Note Intern Pager: [phone]  Patient name: Barbara Crawford Medical record number: [id number] Date of birth: [birth year] Age: 66 y.o. Gender: female  Primary Care Provider: Andria Frames, PA-C Consultants: None  Code Status: FULL  Pt Overview and Major Events to Date:  Admitted 12/13/21 to FMTS  Assessment and Plan: Barbara Crawford is a 65 y.o. 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   * Multifocal pneumonia Vital signs stable, productive cough. Was placed on [place] during dialysis, but it does not appear she had a true desaturation warranting this. No tachypnea this AM. It seems she would be able to successfully transition to oral abx if can wean to RA. Has only received one dose of IV abx 12/09.  - Wean to RA  - Will need a transition to oral abx  - Guaifenesin for cough - IS  - Consider sputum cx if worsening   ESRD (end stage renal disease) ([place]) Regular dialysis schedule -Nephro consulted, recs appreciated -Dialysis Tuesday Thursday Saturday  HTN (hypertension) Hold home Hydralazine, on Metop   Chronic Stable Conditions HFmrEF - continue Lopressor 25 mg twice daily Anemia - continue ferrous sulfate 325 mg daily CAD - continue aspirin 81 mg daily     FEN/GI: Renal  PPx: Hep subq  Dispo:Home tomorrow. Barriers include weaning to RA and abx transition.   Subjective:  Patient reports of a cough onset after dialysis that is persistent and is requesting medication for treatment.   Objective: Temp:  [97.5 F (36.4 C)-98.1 F (36.7 C)] 97.7 F (36.5 C) (12/10 0524) Pulse Rate:  [78-95] 90 (12/10 0524) Resp:  [15-39] 18 (12/10 0524) BP: (99-146)/(59-82) 101/60 (12/10 0524) SpO2:  [95 %-100 %] 100 % (12/10 0524) Weight:  [48.4 kg] 48.4 kg (12/09  1609) General: Alert and oriented in no apparent distress; sitting up at bedside Heart: Regular rate and rhythm with no murmurs appreciated Lungs: Normal work of breathing with diffuse crackles heard on examination. No wheeze.  Skin: Warm and dry Psych: Appropriate mood and affect    Laboratory: Most recent CBC Lab Results  Component Value Date   WBC 7.0 12/10/2021   HGB 12.2 12/10/2021   HCT 41.0 12/10/2021   MCV 95.1 12/10/2021   PLT 205 12/10/2021   Most recent BMP    Latest Ref Rng & Units 12/10/2021    7:19 PM  BMP  Glucose 70 - 99 mg/dL 58   BUN 8 - 23 mg/dL 44   Creatinine 0.44 - 1.00 mg/dL 5.01   Sodium 135 - 145 mmol/L 138   Potassium 3.5 - 5.1 mmol/L 5.1   Chloride 98 - 111 mmol/L 99   CO2 22 - 32 mmol/L 20   Calcium 8.9 - 10.3 mg/dL 8.8      Barbara Emery, MD 12/12/2021, 5:52 AM  PGY-2, [place] Intern pager: [phone], text pages welcome Secure chat group [place]

## 2021-12-12 NOTE — Plan of Care (Signed)
  Problem: Clinical Measurements: Goal: Cardiovascular complication will be avoided Outcome: Progressing   Problem: Activity: Goal: Risk for activity intolerance will decrease Outcome: Progressing   Problem: Nutrition: Goal: Adequate nutrition will be maintained Outcome: Progressing   Problem: Pain Managment: Goal: General experience of comfort will improve Outcome: Progressing   

## 2021-12-12 NOTE — TOC Progression Note (Signed)
Transition of Care [facility]) - Progression Note    Patient Details  Name: Barbara Crawford MRN: [id number] Date of Birth: 1955/12/07  Transition of Care [facility] CM/SW Contact  Bartholomew Crews, RN Phone Number: [phone] 12/12/2021, 2:45 PM  Clinical Narrative:     Attempted to contact patient to discuss post acute transition. Call to hospital room phone - no answer. Call to cell phone - left voicemail requesting call back. TOC to follow for transition needs.        Expected Discharge Plan and Services                                                 Social Determinants of Health (SDOH) Interventions    Readmission Risk Interventions     No data to display

## 2021-12-12 NOTE — Progress Notes (Signed)
[facility] Progress Note   Subjective:  Seen in room - dialyzed overnight without incident - 1L off. BP low this AM, but feels ok. Some improvement in cough, still requiring O2.  Objective Vitals:   12/12/21 0253 12/12/21 0524 12/12/21 0616 12/12/21 0844  BP:  101/60  (!) 87/55  Pulse: 86 90  92  Resp: (!) 21 18    Temp:  97.7 F (36.5 C)    TempSrc:  Oral    SpO2: 100% 100%    Weight:   48.7 kg   Height:       Physical Exam General: Well appearing woman, NAD. [place] O2 in place Heart:RRR; no murmur Lungs: CTA in R lung, L with coarse breath sounds - overall improved from yesterday Abdomen: soft Extremities: no LE edema Dialysis Access:  L AVG + bruit  Additional Objective Labs: Basic Metabolic Panel: Recent Labs  Lab 12/10/21 1919  NA 138  K 5.1  CL 99  CO2 20*  GLUCOSE 58*  BUN 44*  CREATININE 5.01*  CALCIUM 8.8*   CBC: Recent Labs  Lab 12/10/21 1919  WBC 7.0  HGB 12.2  HCT 41.0  MCV 95.1  PLT 205   Studies/Results: CT Chest Wo Contrast  Result Date: 12/11/2021 CLINICAL DATA:  Respiratory illness. EXAM: CT CHEST WITHOUT CONTRAST TECHNIQUE: Multidetector CT imaging of the chest was performed following the standard protocol without IV contrast. RADIATION DOSE REDUCTION: This exam was performed according to the departmental dose-optimization program which includes automated exposure control, adjustment of the mA and/or kV according to patient size and/or use of iterative reconstruction technique. COMPARISON:  Chest x-ray 12/10/2021.  CT chest 03/17/2020. FINDINGS: Cardiovascular: The heart size is normal. The heart is markedly enlarged. No substantial pericardial effusion. Coronary artery calcification is evident. Mild atherosclerotic calcification is noted in the wall of the thoracic aorta. Mediastinum/Nodes: No mediastinal lymphadenopathy. No evidence for gross hilar lymphadenopathy although assessment is limited by the lack of intravenous contrast  on the current study. The esophagus has normal imaging features. There is no axillary lymphadenopathy. Lungs/Pleura: There is diffuse central predominant tree-in-bud ground-glass opacity in all lobes of both lungs with nodular components in the right upper lobe measuring up to 5 mm (image 46/4). Consolidative opacity with volume loss noted left lower lobe (105/4). No evidence for pulmonary edema or pleural effusion. Upper Abdomen: Apparent wall thickening in the stomach is likely related to underdistention. Kidneys are atrophic bilaterally. Musculoskeletal: Diffuse body wall edema evident. No worrisome lytic or sclerotic osseous abnormality. IMPRESSION: 1. Diffuse central predominant tree-in-bud ground-glass opacity in all lobes of both lungs with nodular components in the right upper lobe measuring up to 5 mm. Imaging features are most suggestive of an infectious/inflammatory etiology including atypical etiology. Follow-up CT chest in 3 months recommended to assess for resolution. 2. Consolidative opacity with volume loss left lower lobe, likely atelectasis although pneumonia not excluded. 3. Marked cardiomegaly with coronary artery atherosclerosis. 4. Diffuse body wall edema. 5.  Aortic Atherosclerosis (ICD10-I70.0). Electronically Signed   By: Misty Stanley M.D.   On: 12/11/2021 07:55   DG Chest 2 View  Result Date: 12/10/2021 CLINICAL DATA:  Shortness of breath for 1 week, initial encounter EXAM: CHEST - 2 VIEW COMPARISON:  10/05/2021 FINDINGS: Cardiac shadow is mildly enlarged. Lungs are clear bilaterally. No focal infiltrate or sizable effusion is seen. Previously noted pulmonary edema has resolved. No bony abnormality is noted. IMPRESSION: Acute abnormality noted. Electronically Signed   By: Linus Mako.D.  On: 12/10/2021 20:05   Medications:  cefTRIAXone (ROCEPHIN)  IV 2 g (12/12/21 0752)   doxycycline (VIBRAMYCIN) IV 100 mg (12/12/21 0842)    (feeding supplement) PROSource Plus  30 mL Oral BID  BM   aspirin  81 mg Oral Daily   ferrous sulfate  325 mg Oral Q breakfast   heparin  5,000 Units Subcutaneous Q8H   metoprolol tartrate  25 mg Oral BID   multivitamin with minerals  1 tablet Oral Daily    Dialysis Orders: TTS at [facility] 3:30hr, L AVF - do not have full records.   Assessment/Plan:  HCAP: Remains on IV doxycycline/ceftriaxone - some improvement today.  ESRD: Usual TTS schedule -> next HD 12/2.  BP/volume: hyPOtensive this AM, hold metop and monitor. no LE edema. Anemia: Hgb > 12, no ESA needed. Metabolic bone disease: Ca ok, Phos pending. Continue home meds.  Nutrition: Continue protein supplement.  HFrEF (25%)  Veneta Penton, PA-C 12/12/2021, 9:20 AM  [employer]

## 2021-12-13 ENCOUNTER — Other Ambulatory Visit (HOSPITAL_COMMUNITY): Payer: Self-pay

## 2021-12-13 MED ORDER — CEFDINIR 300 MG PO CAPS
300.0000 mg | ORAL_CAPSULE | Freq: Every day | ORAL | 0 refills | Status: AC
  Filled 2021-12-13: qty 4, 4d supply, fill #0

## 2021-12-13 MED ORDER — GUAIFENESIN 200 MG PO TABS
200.0000 mg | ORAL_TABLET | ORAL | 0 refills | Status: DC | PRN
  Filled 2021-12-13: qty 15, 3d supply, fill #0

## 2021-12-13 MED ORDER — CEFDINIR 300 MG PO CAPS
300.0000 mg | ORAL_CAPSULE | Freq: Every day | ORAL | 0 refills | Status: DC
  Filled 2021-12-13: qty 3, 3d supply, fill #0

## 2021-12-13 MED ORDER — DOXYCYCLINE HYCLATE 100 MG PO TABS
100.0000 mg | ORAL_TABLET | Freq: Two times a day (BID) | ORAL | 0 refills | Status: AC
  Filled 2021-12-13: qty 8, 4d supply, fill #0

## 2021-12-13 MED ORDER — RENA-VITE PO TABS: 1.0000 | ORAL_TABLET | Freq: Every day | ORAL | Status: DC

## 2021-12-13 NOTE — TOC Transition Note (Signed)
Transition of Care [facility]) - CM/SW Discharge Note   Patient Details  Name: Barbara Crawford MRN: [id number] Date of Birth: Sep 01, 1955  [facility] CM/SW Contact:  Tom-Johnson, Renea Ee, RN Phone Number: 12/13/2021, 1:38 PM   Clinical Narrative:     Patient is scheduled for discharge today. CM f/u on home health referral with [facility] states they do not have enough staff at this time. CM called in referral to [facility] per patient's request and Claiborne Billings voiced acceptance, info on AVS. RW and BSC  order called in to [employer] and Erasmo Downer to patient's home. Daughter to transport at discharge. No further TOC needs noted.     Final next level of care: [place] Barriers to Discharge: Barriers Resolved   Patient Goals and CMS Choice Patient states their goals for this hospitalization and ongoing recovery are:: To return home CMS Medicare.gov Compare Post Acute Care list provided to:: Patient Choice offered to / list presented to : Patient  Discharge Placement                Patient to be transferred to facility by: Daughter      Discharge Plan and Services                DME Arranged: N/A DME Agency: NA       HH Arranged: PT, OT HH Agency: [place] Date [place]: 12/13/21 Time [place]: 7741 Representative spoke with at [place]: [place] Determinants of Health ([place] [facility]) Interventions Transportation Interventions: Intervention Not Indicated, Inpatient TOC, Patient Resources (Friends/Family)   Readmission Risk Interventions     No data to display

## 2021-12-13 NOTE — Progress Notes (Signed)
DISCHARGE NOTE HOME Barbara Crawford to be discharged Home per MD order. Discussed prescriptions and follow up appointments with the patient. Prescriptions given to patient; medication list explained in detail. Patient verbalized understanding.  Skin clean, dry and intact without evidence of skin break down, no evidence of skin tears noted. IV catheter discontinued intact. Site without signs and symptoms of complications. Dressing and pressure applied. Pt denies pain at the site currently. No complaints noted.  Patient free of lines, drains, and wounds.   An After Visit Summary (AVS) was printed and given to the patient. Patient escorted via wheelchair, and discharged home via private auto.  Vira Agar, RN

## 2021-12-13 NOTE — Progress Notes (Signed)
    Durable Medical Equipment  (From admission, onward)           Start     Ordered   12/13/21 1409  For home use only DME 4 wheeled rolling walker with seat  Once       Question:  Patient needs a walker to treat with the following condition  Answer:  Gait instability   12/13/21 1411   12/13/21 1409  For home use only DME Bedside commode  Once       Comments: Generalized weakness and decreased activity tolerance necessitate recommendation for 3n1 to use as shower seat at home for safe completion of full showering/bathing tasks.  Question:  Patient needs a bedside commode to treat with the following condition  Answer:  Weakness   12/13/21 1411

## 2021-12-13 NOTE — Progress Notes (Signed)
SATURATION QUALIFICATIONS: (This note is used to comply with regulatory documentation for home oxygen)  Patient Saturations on Room Air at Rest = 100%  Patient Saturations on Room Air while Ambulating = 99%  Patient Saturations on 0 Liters of oxygen while Ambulating = 99%

## 2021-12-13 NOTE — Discharge Summary (Addendum)
[facility] Discharge Summary  Patient name: Barbara Crawford Medical record number: [id number] Date of birth: 09-20-1955 Age: 66 y.o. Gender: female Date of Admission: 12/10/2021  Date of Discharge: 12/13/2021 Admitting Physician: Salvadore Oxford, MD  Primary Care Provider: Gerald Leitz Consultants: Nephrology  Indication for Hospitalization: SOB  Discharge Diagnoses/Problem List:  Principal Problem for Admission: Multifocal PNA Other Problems addressed during stay:  Principal Problem:   Multifocal pneumonia Active Problems:   HTN (hypertension)   ESRD (end stage renal disease) [facility])   Pneumonia  Brief Hospital Course:  Barbara Crawford is a 66 y.o.female with a history of coronary artery disease, 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 Medicine Teaching Service at [facility] for dyspnea. Her hospital course is detailed below:  Multifocal pneumonia  Patient presented with dyspnea after failed outpatient oral doxycycline, CT demonstrated multifocal PNA. Patient was treated initially with doxy IV and CTX due to prolonged QT interval seen on EKG. Throughout admission blood cultures were negative. At discharge patient was successfully transitioned to oral antibiotics and in stable respiratory condition.  ESRD TTS Nephrology was consulted on admission.  Patient was continued with regular dialysis schedule.  Other chronic conditions were medically managed with home medications and formulary alternatives as necessary (HFmrEF/HTN/Anemia/CAD)  PCP Follow-up Recommendations: Complete Doxycycline and Cefdinir 300mg  daily on 12/15 Stopped metoprolol on d/c due to softer BPs, revisit at PCP Continue outpatient dialysis and nephrology follow up  Disposition: Home  Discharge Condition: Stable   Discharge Exam:  Vitals:   12/13/21 0916 12/13/21 0947  BP: (!) 92/55  (!) 103/57  Pulse: 92 92  Resp: 18 18  Temp: (!) 97.5 F (36.4 C) (!) 97.3 F (36.3 C)  SpO2: 99% 97%   General: Well-appearing. Alert. NAD CV: RRR without murmur Pulm: Normal WOB on RA. Mild crackles, no wheezing Abdomen: Soft, non-tender, non-distended. +BS Ext: Well perfused. Cap refill < 3 seconds  Significant Procedures: None  Significant Labs and Imaging:  Recent Labs  Lab 12/12/21 1011  WBC 7.2  HGB 11.9*  HCT 37.1  PLT 176   Recent Labs  Lab 12/12/21 1011  NA 137  K 3.5  CL 96*  CO2 28  GLUCOSE 117*  BUN 29*  CREATININE 5.64*  CALCIUM 7.7*  PHOS 3.1  ALBUMIN 2.3*    Pertinent Imaging: CT Chest Wo Contrast  Result Date: 12/11/2021 IMPRESSION: 1. Diffuse central predominant tree-in-bud ground-glass opacity in all lobes of both lungs with nodular components in the right upper lobe measuring up to 5 mm. Imaging features are most suggestive of an infectious/inflammatory etiology including atypical etiology. Follow-up CT chest in 3 months recommended to assess for resolution. 2. Consolidative opacity with volume loss left lower lobe, likely atelectasis although pneumonia not excluded. 3. Marked cardiomegaly with coronary artery atherosclerosis. 4. Diffuse body wall edema. 5.  Aortic Atherosclerosis (ICD10-I70.0).   DG Chest 2 View  Result Date: 12/10/2021 IMPRESSION: Acute abnormality noted. Electronically Signed   By: Inez Catalina M.D.   On: 12/10/2021 20:05     Results/Tests Pending at Time of Discharge: None  Discharge Medications:  Allergies as of 12/13/2021       Reactions   Bee Venom Anaphylaxis   Shellfish-derived Products Anaphylaxis, Swelling, Other (See Comments)   Sweat and Dizzy. Lobster    Eggs Or Egg-derived Products Nausea Only   Erythromycin Nausea Only, Other (See Comments)   Cramps  Chlorhexidine Rash   Iodine Rash, Other (See Comments)   Arm flares up per pt   Penicillins Rash   Has patient had a PCN reaction causing immediate  rash, facial/tongue/throat swelling, SOB or lightheadedness with hypotension: no Has patient had a PCN reaction causing severe rash involving mucus membranes or skin necrosis: yes Has patient had a PCN reaction that required hospitalization: no Has patient had a PCN reaction occurring within the last 10 years: no If all of the above answers are "NO", then may proceed with Cephalosporin use.        Medication List     STOP taking these medications    DELSYM PO   hydrALAZINE 25 MG tablet Commonly known as: APRESOLINE   levofloxacin 250 MG tablet Commonly known as: LEVAQUIN   metoprolol succinate 25 MG 24 hr tablet Commonly known as: Toprol XL   metoprolol tartrate 25 MG tablet Commonly known as: LOPRESSOR       TAKE these medications    acetaminophen 500 MG tablet Commonly known as: TYLENOL Take 500 mg by mouth as needed for mild pain, moderate pain or fever.   aspirin 81 MG chewable tablet Chew 81 mg by mouth daily.   cefdinir 300 MG capsule Commonly known as: OMNICEF Take 1 capsule (300 mg total) by mouth daily for 4 doses. Start taking on: December 14, 2021   docusate sodium 100 MG capsule Commonly known as: COLACE Take 100 mg by mouth daily.   doxycycline 100 MG tablet Commonly known as: VIBRA-TABS Take 1 tablet (100 mg total) by mouth 2 (two) times daily for 4 days.   ferrous sulfate 325 (65 FE) MG tablet Take 325 mg by mouth in the morning and at bedtime.   guaiFENesin 200 MG tablet Take 1 tablet (200 mg total) by mouth every 4 (four) hours as needed for cough.   LORazepam 0.5 MG tablet Commonly known as: ATIVAN Take 0.5 mg by mouth as needed for anxiety.   multivitamin Tabs tablet Take 1 tablet by mouth at bedtime.       Discharge Instructions: Please refer to Patient Instructions section of EMR for full details.  Patient was counseled important signs and symptoms that should prompt return to medical care, changes in medications, dietary  instructions, activity restrictions, and follow up appointments.   Follow-Up Appointments:  Follow-up Information     Arlyce Dice, MD Follow up.   Specialty: Family Medicine Why: 12/15 @ 8:45AM Contact information: [place] [address] [phone]                Colletta Maryland, MD 12/13/2021, 11:03 AM PGY-1, [place]   I was personally present and performed or re-performed the history, physical exam and medical decision making activities of this service and have verified that the service and findings are accurately documented in the intern's note.  Wells Guiles, DO                  12/13/2021, 12:56 PM

## 2021-12-13 NOTE — Discharge Instructions (Addendum)
Dear Barbara Crawford,   Thank you for letting us participate in your care! In this section, you will find a brief summary of why you were admitted to the hospital, what happened during your admission, your diagnosis/diagnoses, and recommended follow up.  You were admitted because you were experiencing shortness of breath and cough.  You were diagnosed with pneumonia. You were treated with IV antibiotics.  Your breathing improved and you were discharged from the hospital for meeting this goal.   POST-HOSPITAL & CARE INSTRUCTIONS Please continue to take your antibiotics Doxycycline and Cefdinir until 12/15. We are also sending in a prescription for the cough medication (Guaifenesin). Please stop taking your metoprolol as you did not have high blood pressure in the hospital and follow up Please let PCP/Specialists know of any changes in medications that were made.  Please see medications section of this packet for any medication changes.   DOCTOR'S APPOINTMENTS & FOLLOW UP Future Appointments  Date Time Provider [place]  01/14/2022  9:30 AM [facility] PHARMACIST [facility]  01/24/2022  8:00 AM Barbarann Ehlers Junius Creamer., NP [facility]     Thank you for choosing [facility]! Take care and be well!  [facility]  [address], [place] [address] [phone]

## 2021-12-13 NOTE — Progress Notes (Signed)
D/C order noted. Contacted [facility] and spoke to [place], [place] manager. Jeani Hawking advised pt will d/c today and resume care tomorrow. Jeani Hawking requesting that nephrologist or PA call clinic with book back orders. Contacted nephrology staff with clinic's request. Clinic has access to [facility] epic for needed clinicals.   Melven Sartorius Renal Navigator [phone]

## 2021-12-13 NOTE — Progress Notes (Signed)
  [facility] Progress Note   Subjective:   Possible d/c home today. She is feeling well, denies SOB, CP, dizziness, abdominal pain and nausea. Asks about getting home O2 because she is worried about walking up stairs.   Objective Vitals:   12/12/21 1712 12/12/21 2136 12/13/21 0503 12/13/21 0916  BP: 96/63 100/62 103/60 (!) 92/55  Pulse: 93 86 83 92  Resp:  18 18 18   Temp: 97.7 F (36.5 C) (!) 97.4 F (36.3 C) (!) 97.5 F (36.4 C) (!) 97.5 F (36.4 C)  TempSrc: Oral Oral Oral Oral  SpO2: 100% 100% 99% 99%  Weight:      Height:       Physical Exam General: Alert female in NAD, on RA Heart: RRR, no murmurs, rubs or gallops Lungs: CTA bilaterally without wheezing, rhonchi or rales  Abdomen: Soft, non-distended, +BS Extremities: no edema b/l lower extremities Dialysis Access: LUE AVG + bruit  Additional Objective Labs: Basic Metabolic Panel: Recent Labs  Lab 12/10/21 1919 12/12/21 1011  NA 138 137  K 5.1 3.5  CL 99 96*  CO2 20* 28  GLUCOSE 58* 117*  BUN 44* 29*  CREATININE 5.01* 5.64*  CALCIUM 8.8* 7.7*  PHOS  --  3.1   Liver Function Tests: Recent Labs  Lab 12/12/21 1011  ALBUMIN 2.3*   No results for input(s): "LIPASE", "AMYLASE" in the last 168 hours. CBC: Recent Labs  Lab 12/10/21 1919 12/12/21 1011  WBC 7.0 7.2  HGB 12.2 11.9*  HCT 41.0 37.1  MCV 95.1 90.5  PLT 205 176   Blood Culture    Component Value Date/Time   SDES BLOOD RIGHT HAND 12/10/2021 1919   SPECREQUEST  12/10/2021 1919    BOTTLES DRAWN AEROBIC ONLY Blood Culture adequate volume   CULT  12/10/2021 1919    NO GROWTH 2 DAYS Performed at [facility], [place] [address] [place], [place] [address]    REPTSTATUS PENDING 12/10/2021 1919    Cardiac Enzymes: No results for input(s): "CKTOTAL", "CKMB", "CKMBINDEX", "TROPONINI" in the last 168 hours. CBG: Recent Labs  Lab 12/11/21 0143  GLUCAP 88   Iron Studies: No results for input(s): "IRON", "TIBC",  "TRANSFERRIN", "FERRITIN" in the last 72 hours. @lablastinr3 @ Studies/Results: No results found. Medications:  cefTRIAXone (ROCEPHIN)  IV 2 g (12/13/21 0853)   doxycycline (VIBRAMYCIN) IV Stopped (12/12/21 2218)    (feeding supplement) PROSource Plus  30 mL Oral BID BM   aspirin  81 mg Oral Daily   ferrous sulfate  325 mg Oral Q breakfast   heparin  5,000 Units Subcutaneous Q8H   metoprolol tartrate  25 mg Oral BID   multivitamin  1 tablet Oral QHS    Dialysis Orders: TTS at [facility] 3:30hr, L AVF - do not have full records.  Assessment/Plan:  HCAP: Remains on IV doxycycline/ceftriaxone - some improvement today. Appears plan is to discharge her on oral antibiotics. On room air but is worried about dyspnea with stairs at home, will refer to primary team/PT  ESRD: Usual TTS schedule -> next HD 12/12.  BP/volume: hyPOtensive this admission, have been holding metoprolol. Likely need to hold at d/c and resume outpatient PRN Anemia: Hgb > 12, no ESA needed. Metabolic bone disease: Ca and phos at goal. Continue home meds.  Nutrition: Continue protein supplement.  HFrEF (25%)  Anice Paganini, PA-C 12/13/2021, 9:41 AM  [facility] Pager: [phone]

## 2021-12-13 NOTE — Plan of Care (Signed)
  Problem: Health Behavior/Discharge Planning: Goal: Ability to manage health-related needs will improve Outcome: Completed/Met   Problem: Clinical Measurements: Goal: Ability to maintain clinical measurements within normal limits will improve Outcome: Completed/Met Goal: Will remain free from infection Outcome: Completed/Met Goal: Diagnostic test results will improve Outcome: Completed/Met Goal: Respiratory complications will improve Outcome: Completed/Met Goal: Cardiovascular complication will be avoided Outcome: Completed/Met   Problem: Activity: Goal: Risk for activity intolerance will decrease Outcome: Completed/Met   Problem: Nutrition: Goal: Adequate nutrition will be maintained Outcome: Completed/Met   Problem: Elimination: Goal: Will not experience complications related to bowel motility Outcome: Completed/Met   Problem: Skin Integrity: Goal: Risk for impaired skin integrity will decrease Outcome: Completed/Met   Problem: Education: Goal: Knowledge of General Education information will improve Description: Including pain rating scale, medication(s)/side effects and non-pharmacologic comfort measures Outcome: Completed/Met   Problem: Health Behavior/Discharge Planning: Goal: Ability to manage health-related needs will improve Outcome: Completed/Met   Problem: Clinical Measurements: Goal: Ability to maintain clinical measurements within normal limits will improve Outcome: Completed/Met Goal: Will remain free from infection Outcome: Completed/Met Goal: Diagnostic test results will improve Outcome: Completed/Met Goal: Respiratory complications will improve Outcome: Completed/Met Goal: Cardiovascular complication will be avoided Outcome: Completed/Met   Problem: Activity: Goal: Risk for activity intolerance will decrease Outcome: Completed/Met   Problem: Nutrition: Goal: Adequate nutrition will be maintained Outcome: Completed/Met   Problem:  Coping: Goal: Level of anxiety will decrease Outcome: Completed/Met   Problem: Elimination: Goal: Will not experience complications related to bowel motility Outcome: Completed/Met Goal: Will not experience complications related to urinary retention Outcome: Completed/Met   Problem: Pain Managment: Goal: General experience of comfort will improve Outcome: Completed/Met   Problem: Safety: Goal: Ability to remain free from injury will improve Outcome: Completed/Met   Problem: Skin Integrity: Goal: Risk for impaired skin integrity will decrease Outcome: Completed/Met

## 2021-12-14 LAB — HEPATITIS B SURFACE ANTIBODY, QUANTITATIVE: Hep B S AB Quant (Post): 3.5 m[IU]/mL — ABNORMAL LOW (ref >9.9)

## 2021-12-15 LAB — CULTURE, BLOOD (ROUTINE X 2)
Culture: NO GROWTH
Culture: NO GROWTH
Special Requests: ADEQUATE

## 2022-01-10 MED ORDER — METOPROLOL SUCCINATE ER 25 MG PO TB24: 12.5000 mg | ORAL_TABLET | Freq: Every day | ORAL | 1 refills | Status: DC

## 2022-01-14 ENCOUNTER — Ambulatory Visit: Payer: Medicare Other

## 2022-01-23 NOTE — Progress Notes (Signed)
Office Visit    Patient Name: Barbara Crawford Date of Encounter: 01/23/2022  Primary Care Provider:  Gerald Leitz Primary Cardiologist:  Larae Grooms, MD Primary Electrophysiologist: None  Chief Complaint    [name] is a 67 y.o. 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  Past Medical History    Past Medical History:  Diagnosis Date   Anxiety    ESRD (end stage renal disease) on dialysis ([place])    Heart failure with mildly reduced ejection fraction (HFmrEF) ([place])    Hypertension    Past Surgical History:  Procedure Laterality Date   AV FISTULA PLACEMENT Left 03/13/2020   Procedure: Creation of Left Brachiocephalic fistula;  Surgeon: Marty Heck, MD;  Location: [facility];  Service: Vascular;  Laterality: Left;   IR FLUORO GUIDE CV LINE RIGHT  02/26/2020   IR FLUORO GUIDE CV LINE RIGHT  03/09/2020   IR US GUIDE VASC ACCESS RIGHT  02/26/2020   IR US GUIDE VASC ACCESS RIGHT  03/09/2020   RIGHT/LEFT HEART CATH AND CORONARY ANGIOGRAPHY N/A 10/07/2021   Procedure: RIGHT/LEFT HEART CATH AND CORONARY ANGIOGRAPHY;  Surgeon: Martinique, Peter M, MD;  Location: [facility];  Service: Cardiovascular;  Laterality: N/A;    Allergies  Allergies  Allergen Reactions   Bee Venom Anaphylaxis   Shellfish-Derived Products Anaphylaxis, Swelling and Other (See Comments)    Sweat and Dizzy. Lobster    Eggs Or Egg-Derived Products Nausea Only   Erythromycin Nausea Only and Other (See Comments)    Cramps    Chlorhexidine Rash   Iodine Rash and Other (See Comments)    Arm flares up per pt   Penicillins Rash    Has patient had a PCN reaction causing immediate rash, facial/tongue/throat swelling, SOB or lightheadedness with hypotension: no Has patient had a PCN reaction causing severe rash involving mucus membranes or skin necrosis: yes Has patient had a PCN reaction that required  hospitalization: no Has patient had a PCN reaction occurring within the last 10 years: no If all of the above answers are "NO", then may proceed with Cephalosporin use.    History of Present Illness   [name]  is a 67 year old female with the above mention past medical history who presents today for follow-up of hospitalization of acute on chronic CHF with elevated troponins.  [name] presented to the ED via EMS on 10/3 with complaint of sudden onset of shortness of breath.  Patient was noted to be hypoxic and was placed on 6 L nasal cannula and brought to the ED for further follow-up.  EKG in the ED showed sinus tach with left anterior fascicular block, LVH with nonspecific T wave changes.  Patient's high-sensitivity troponin was 64>>74 and chest x-ray revealed cardiomegaly with pulmonary edema and small left-sided pleural effusion.  Patient has history of ESRD and was taken to HD for urgent dialysis.  Patient had improvement with HD however continued to have shortness of breath.  High-sensitivity troponins repeated and trended upward to 217.  2D echo was performed showing severely decreased LV function and EF of 25% with global hypokinesis with mild concentric LVH and grade 1 DD, with mildly reduced LV function and moderately elevated pulmonary artery systolic pressure of 01.0 mmHg, LA/RA mildly dilated with mild MV noted.  2D echo 08/2020 showed LVEF of 45%, global hypokinesis, g2DD, normal  RV size and function, mild to moderate MR.  She underwent R/LHC for further evaluation that revealed 80% second diagonal lesion that was small caliber with minimal CAD and mildly elevated LV filling pressures with mild pulmonary hypertension with PAP 41/18 mean 28 mm Hg and cardiac output index of 6.41.  Medical management was indicated for treatment.  She was started on BiDil and metoprolol tartrate with no renal GDMT due to ESRD.  She was seen 10/25/2021 for post hospital follow-up of acute CHF  exacerbation.  During visit patient's blood pressures were elevated however she had not taken her metoprolol prior to visit.  Visit no adjustments were made and patient was encouraged to continue metoprolol and BiDil.  She contacted office 01/10/2022 with complaints of elevated blood pressure.  Advised her to take metoprolol succinate 12.5 mg daily.  [name] presents today for 53-month follow-up alone.  Since last being seen in the office patient reports she has been doing well since her previous visit.  Her blood pressure today well-controlled at 126/72 and heart rate is 90 bpm.  She reports no adverse reactions with her new Toprol-XL prescription.  She was recently hospitalized for pneumonia and underwent antibiotic therapy.  Today she reports no residual effects from pneumonia but does still have some phlegm and slight cough.  During our visit we discussed resuming physical activity and she is currently interested in joining [employer].  I we will provide her with information for the PREP program.  She is currently working working part-time in a daycare.  I advised her to continue to wear her mask and to wash her hands frequently.  Patient denies chest pain, palpitations, dyspnea, PND, orthopnea, nausea, vomiting, dizziness, syncope, edema, weight gain, or early satiety.    Home Medications    Current Outpatient Medications  Medication Sig Dispense Refill   acetaminophen (TYLENOL) 500 MG tablet Take 500 mg by mouth as needed for mild pain, moderate pain or fever.     aspirin 81 MG chewable tablet Chew 81 mg by mouth daily.     docusate sodium (COLACE) 100 MG capsule Take 100 mg by mouth daily.     ferrous sulfate 325 (65 FE) MG tablet Take 325 mg by mouth in the morning and at bedtime.     guaiFENesin 200 MG tablet Take 1 tablet (200 mg total) by mouth every 4 (four) hours as needed for cough. 15 tablet 0   LORazepam (ATIVAN) 0.5 MG tablet Take 0.5 mg by mouth as needed for anxiety.      metoprolol succinate (TOPROL XL) 25 MG 24 hr tablet Take 0.5 tablets (12.5 mg total) by mouth daily. 45 tablet 1   multivitamin (RENA-VIT) TABS tablet Take 1 tablet by mouth at bedtime.     No current facility-administered medications for this visit.     Review of Systems  Please see the history of present illness.    (+) Congestion (+) Fatigue  All other systems reviewed and are otherwise negative except as noted above.  Physical Exam    Wt Readings from Last 3 Encounters:  12/12/21 107 lb 5.8 oz (48.7 kg)  10/25/21 111 lb 6.4 oz (50.5 kg)  10/08/21 112 lb (50.8 kg)   XB:WIOMB were no vitals filed for this visit.,There is no height or weight on file to calculate BMI.  Constitutional:      Appearance: Healthy appearance. Not in distress.  Neck:     Vascular: JVD normal.  Pulmonary:  Effort: Pulmonary effort is normal.     Breath sounds: No wheezing. No rales. Diminished in the bases Cardiovascular:     Normal rate. Regular rhythm. Normal S1. Normal S2.      Murmurs: There is no murmur.  Edema:    Peripheral edema absent.  Abdominal:     Palpations: Abdomen is soft non tender. There is no hepatomegaly.  Skin:    General: Skin is warm and dry.  Neurological:     General: No focal deficit present.     Mental Status: Alert and oriented to person, place and time.     Cranial Nerves: Cranial nerves are intact.  EKG/LABS/Other Studies Reviewed    ECG personally reviewed by me today -none completed today  Lab Results  Component Value Date   WBC 7.2 12/12/2021   HGB 11.9 (L) 12/12/2021   HCT 37.1 12/12/2021   MCV 90.5 12/12/2021   PLT 176 12/12/2021   Lab Results  Component Value Date   CREATININE 5.64 (H) 12/12/2021   BUN 29 (H) 12/12/2021   NA 137 12/12/2021   K 3.5 12/12/2021   CL 96 (L) 12/12/2021   CO2 28 12/12/2021   Lab Results  Component Value Date   ALT 24 10/05/2021   AST 34 10/05/2021   ALKPHOS 72 10/05/2021   BILITOT 0.6 10/05/2021   Lab  Results  Component Value Date   CHOL 204 (H) 02/27/2020   HDL 52 02/27/2020   LDLCALC 129 (H) 02/27/2020   TRIG 68 08/16/2020   CHOLHDL 3.9 02/27/2020    Lab Results  Component Value Date   HGBA1C 5.4 08/15/2020    Assessment & Plan    1.  Combined chronic systolic and diastolic CHF: -2D echo was performed showing severely decreased LV function and EF of 25% with global hypokinesis with mild concentric LVH and grade 1 DD, with mildly reduced LV  -Today patient is euvolemic on examination -Patient currently not on SGLT2 or ARB due to ESRD -Volume currently managed by dialysis -Continue current GDMT with BiDil 20-37.5 mg 3 times daily and metoprolol 25 mg twice daily mg daily -Low sodium diet, fluid restriction <2L, and daily weights encouraged. Educated to contact our office for weight gain of 2 lbs overnight or 5 lbs in one week.    2.  Essential hypertension: -Patient's blood pressure today is well-controlled at 126/72  -Patient reports that she is tolerating Toprol-XL with no adverse reactions. -She reports that she is feeling much better and breathing has improved with Toprol-XL.   3.  NICM: -R/LHC performed with nonobstructive CAD noted -Patient is euvolemic on exam today  -She denies any chest pain or shortness of breath and is interested in joining [employer] at [employer].   4.  Nonobstructive CAD: -R/LHC performed with 80% second diagonal lesion that was small caliber with minimal CAD -She denies any chest pain or shortness of breath since previous visit. -Continue ASA 81 mg and Toprol 25 mg daily   5. ESRD: -HD on HD TTS per nephrology  Disposition: Follow-up with Larae Grooms, MD or APP in 6 months   Medication Adjustments/Labs and Tests Ordered: Current medicines are reviewed at length with the patient today.  Concerns regarding medicines are outlined above.   Signed, Mable Fill, Marissa Nestle, NP 01/23/2022, 2:57 PM [facility]  Note:  This document was prepared using Dragon voice recognition software and may include unintentional dictation errors.

## 2022-01-24 ENCOUNTER — Ambulatory Visit: Payer: Medicare HMO | Attending: Nurse Practitioner | Admitting: Nurse Practitioner

## 2022-01-24 ENCOUNTER — Encounter: Payer: Self-pay | Admitting: Nurse Practitioner

## 2022-01-24 VITALS — BP 126/72 | HR 90 | Ht 63.0 in | Wt 111.4 lb

## 2022-01-24 DIAGNOSIS — N186 End stage renal disease: Secondary | ICD-10-CM | POA: Diagnosis not present

## 2022-01-24 DIAGNOSIS — I10 Essential (primary) hypertension: Secondary | ICD-10-CM

## 2022-01-24 DIAGNOSIS — I5022 Chronic systolic (congestive) heart failure: Secondary | ICD-10-CM | POA: Diagnosis not present

## 2022-01-24 DIAGNOSIS — I428 Other cardiomyopathies: Secondary | ICD-10-CM

## 2022-01-24 DIAGNOSIS — I5043 Acute on chronic combined systolic (congestive) and diastolic (congestive) heart failure: Secondary | ICD-10-CM

## 2022-01-24 MED ORDER — METOPROLOL SUCCINATE ER 25 MG PO TB24: 12.5000 mg | ORAL_TABLET | Freq: Every day | ORAL | 2 refills | Status: DC

## 2022-01-24 NOTE — Patient Instructions (Signed)
Medication Instructions:  Your physician recommends that you continue on your current medications as directed. Please refer to the Current Medication list given to you today. *If you need a refill on your cardiac medications before your next appointment, please call your pharmacy*   Lab Work: None Ordered   Testing/Procedures: None Ordered   Follow-Up: At [facility], you and your health needs are our priority.  As part of our continuing mission to provide you with exceptional heart care, we have created designated Provider Care Teams.  These Care Teams include your primary Cardiologist (physician) and Advanced Practice Providers (APPs -  Physician Assistants and Nurse Practitioners) who all work together to provide you with the care you need, when you need it.  We recommend signing up for the patient portal called "MyChart".  Sign up information is provided on this After Visit Summary.  MyChart is used to connect with patients for Virtual Visits (Telemedicine).  Patients are able to view lab/test results, encounter notes, upcoming appointments, etc.  Non-urgent messages can be sent to your provider as well.   To learn more about what you can do with MyChart, go to [email or web address].    Your next appointment:   6 month(s)  Provider:   Larae Grooms, MD     Other Instructions

## 2022-02-25 ENCOUNTER — Ambulatory Visit: Payer: Medicare Other

## 2022-04-06 ENCOUNTER — Ambulatory Visit: Payer: Medicare HMO

## 2022-05-01 NOTE — Progress Notes (Unsigned)
Patient ID: Barbara Crawford                 DOB: June 06, 1955                      MRN: [id number]     HPI: Barbara Crawford is a 67 y.o. female patient of ______ referred by Robin Searing, NP, to pharmacy clinic for HF medication management. PMH is significant for CHF, ESRD. Most recent LVEF *** on ***.  Discussion with patient today included the following: cardiac medication indications, review of GDMT including reasoning behind medication titration, importance of medication adherence, and patient engagement. ***Denies dizziness, fatigue, LE edema, or SOB. Able to complete all ADLs. *** checks daily weights at home. *** adheres to a low-sodium diet.  Current CHF meds:  Previously tried:  BP goal:   Family History:   Social History:   Diet:   Exercise:   Home BP readings:   Wt Readings from Last 3 Encounters:  01/24/22 111 lb 6.4 oz (50.5 kg)  12/12/21 107 lb 5.8 oz (48.7 kg)  10/25/21 111 lb 6.4 oz (50.5 kg)   BP Readings from Last 3 Encounters:  01/24/22 126/72  12/13/21 (!) 103/57  10/25/21 (!) 148/70   Pulse Readings from Last 3 Encounters:  01/24/22 90  12/13/21 92  10/25/21 100    Renal function: CrCl cannot be calculated (Patient's most recent lab result is older than the maximum 21 days allowed.).  Past Medical History:  Diagnosis Date   Anxiety    ESRD (end stage renal disease) on dialysis ([facility])    Heart failure with mildly reduced ejection fraction (HFmrEF) ([facility])    Hypertension     Current Outpatient Medications on File Prior to Visit  Medication Sig Dispense Refill   acetaminophen (TYLENOL) 500 MG tablet Take 500 mg by mouth as needed for mild pain, moderate pain or fever.     aspirin 81 MG chewable tablet Chew 81 mg by mouth daily.     docusate sodium (COLACE) 100 MG capsule Take 100 mg by mouth as needed for moderate constipation or mild constipation.     ferrous sulfate 325 (65 FE) MG tablet Take 325 mg by mouth in the morning and at  bedtime.     LORazepam (ATIVAN) 0.5 MG tablet Take 0.5 mg by mouth as needed for anxiety.     metoprolol succinate (TOPROL XL) 25 MG 24 hr tablet Take 0.5 tablets (12.5 mg total) by mouth daily. 45 tablet 2   multivitamin (RENA-VIT) TABS tablet Take 1 tablet by mouth at bedtime.     No current facility-administered medications on file prior to visit.    Allergies  Allergen Reactions   Bee Venom Anaphylaxis   Shellfish-Derived Products Anaphylaxis, Swelling and Other (See Comments)    Sweat and Dizzy. Lobster    Egg-Derived Products Nausea Only   Erythromycin Nausea Only and Other (See Comments)    Cramps    Chlorhexidine Rash   Iodine Rash and Other (See Comments)    Arm flares up per pt   Penicillins Rash    Has patient had a PCN reaction causing immediate rash, facial/tongue/throat swelling, SOB or lightheadedness with hypotension: no Has patient had a PCN reaction causing severe rash involving mucus membranes or skin necrosis: yes Has patient had a PCN reaction that required hospitalization: no Has patient had a PCN reaction occurring within the last 10 years: no If all of the above  answers are "NO", then may proceed with Cephalosporin use.     Assessment/Plan:  1. CHF -

## 2022-05-02 ENCOUNTER — Emergency Department (HOSPITAL_COMMUNITY): Payer: Medicare HMO

## 2022-05-02 ENCOUNTER — Other Ambulatory Visit: Payer: Self-pay

## 2022-05-02 ENCOUNTER — Ambulatory Visit: Payer: Medicare HMO

## 2022-05-02 ENCOUNTER — Encounter (HOSPITAL_COMMUNITY): Payer: Self-pay

## 2022-05-02 ENCOUNTER — Emergency Department (HOSPITAL_COMMUNITY)
Admission: EM | Admit: 2022-05-02 | Discharge: 2022-05-02 | Disposition: A | Discharge status: Home / Self Care | Payer: Medicare HMO | Attending: Emergency Medicine | Admitting: Emergency Medicine

## 2022-05-02 DIAGNOSIS — R0602 Shortness of breath: Secondary | ICD-10-CM | POA: Diagnosis present

## 2022-05-02 DIAGNOSIS — I120 Hypertensive chronic kidney disease with stage 5 chronic kidney disease or end stage renal disease: Secondary | ICD-10-CM | POA: Insufficient documentation

## 2022-05-02 DIAGNOSIS — Z992 Dependence on renal dialysis: Secondary | ICD-10-CM | POA: Insufficient documentation

## 2022-05-02 DIAGNOSIS — Z79899 Other long term (current) drug therapy: Secondary | ICD-10-CM | POA: Insufficient documentation

## 2022-05-02 DIAGNOSIS — N186 End stage renal disease: Secondary | ICD-10-CM | POA: Diagnosis not present

## 2022-05-02 DIAGNOSIS — U071 COVID-19: Secondary | ICD-10-CM | POA: Diagnosis not present

## 2022-05-02 LAB — CBC WITH DIFFERENTIAL/PLATELET
Abs Immature Granulocytes: 0.24 10*3/uL — ABNORMAL HIGH (ref 0.00–0.07)
Basophils Absolute: 0.1 10*3/uL (ref 0.0–0.1)
Basophils Relative: 1 %
Eosinophils Absolute: 0.3 10*3/uL (ref 0.0–0.5)
Eosinophils Relative: 3 %
HCT: 40.5 % (ref 36.0–46.0)
Hemoglobin: 11.4 g/dL — ABNORMAL LOW (ref 12.0–15.0)
Immature Granulocytes: 2 %
Lymphocytes Relative: 10 %
Lymphs Abs: 1 10*3/uL (ref 0.7–4.0)
MCH: 27.9 pg (ref 26.0–34.0)
MCHC: 28.1 g/dL — ABNORMAL LOW (ref 30.0–36.0)
MCV: 99.3 fL (ref 80.0–100.0)
Monocytes Absolute: 1 10*3/uL (ref 0.1–1.0)
Monocytes Relative: 9 %
Neutro Abs: 8 10*3/uL — ABNORMAL HIGH (ref 1.7–7.7)
Neutrophils Relative %: 75 %
Platelets: 144 10*3/uL — ABNORMAL LOW (ref 150–400)
RBC: 4.08 MIL/uL (ref 3.87–5.11)
RDW: 15.6 % — ABNORMAL HIGH (ref 11.5–15.5)
WBC: 10.6 10*3/uL — ABNORMAL HIGH (ref 4.0–10.5)
nRBC: 0 % (ref 0.0–0.2)

## 2022-05-02 LAB — I-STAT CHEM 8, ED
BUN: 37 mg/dL — ABNORMAL HIGH (ref 8–23)
Calcium, Ion: 1.03 mmol/L — ABNORMAL LOW (ref 1.15–1.40)
Chloride: 104 mmol/L (ref 98–111)
Creatinine, Ser: 10.4 mg/dL — ABNORMAL HIGH (ref 0.44–1.00)
Glucose, Bld: 85 mg/dL (ref 70–99)
HCT: 35 % — ABNORMAL LOW (ref 36.0–46.0)
Hemoglobin: 11.9 g/dL — ABNORMAL LOW (ref 12.0–15.0)
Potassium: 3.7 mmol/L (ref 3.5–5.1)
Sodium: 138 mmol/L (ref 135–145)
TCO2: 23 mmol/L (ref 22–32)

## 2022-05-02 LAB — COMPREHENSIVE METABOLIC PANEL
ALT: 11 U/L (ref 0–44)
AST: 17 U/L (ref 15–41)
Albumin: 2.5 g/dL — ABNORMAL LOW (ref 3.5–5.0)
Alkaline Phosphatase: 38 U/L (ref 38–126)
Anion gap: 16 — ABNORMAL HIGH (ref 5–15)
BUN: 40 mg/dL — ABNORMAL HIGH (ref 8–23)
CO2: 23 mmol/L (ref 22–32)
Calcium: 8.5 mg/dL — ABNORMAL LOW (ref 8.9–10.3)
Chloride: 99 mmol/L (ref 98–111)
Creatinine, Ser: 8.78 mg/dL — ABNORMAL HIGH (ref 0.44–1.00)
GFR, Estimated: 5 mL/min — ABNORMAL LOW (ref >60)
Glucose, Bld: 89 mg/dL (ref 70–99)
Potassium: 3.4 mmol/L — ABNORMAL LOW (ref 3.5–5.1)
Sodium: 138 mmol/L (ref 135–145)
Total Bilirubin: 0.8 mg/dL (ref 0.3–1.2)
Total Protein: 7.8 g/dL (ref 6.5–8.1)

## 2022-05-02 MED ORDER — BENZONATATE 100 MG PO CAPS: 100.0000 mg | ORAL_CAPSULE | Freq: Three times a day (TID) | ORAL | 0 refills | Status: DC

## 2022-05-02 MED ORDER — MOLNUPIRAVIR EUA 200MG CAPSULE: 4.0000 | ORAL_CAPSULE | Freq: Two times a day (BID) | ORAL | 0 refills | Status: AC

## 2022-05-02 MED ORDER — DOXYCYCLINE HYCLATE 100 MG PO TABS
100.0000 mg | ORAL_TABLET | Freq: Once | ORAL | Status: AC
  Administered 2022-05-02: 100 mg via ORAL
  Filled 2022-05-02: qty 1

## 2022-05-02 MED ORDER — DOXYCYCLINE HYCLATE 100 MG PO CAPS: 100.0000 mg | ORAL_CAPSULE | Freq: Two times a day (BID) | ORAL | 0 refills | Status: DC

## 2022-05-02 MED ORDER — IBUPROFEN 800 MG PO TABS
800.0000 mg | ORAL_TABLET | Freq: Once | ORAL | Status: DC
  Filled 2022-05-02: qty 1

## 2022-05-02 MED ORDER — ACETAMINOPHEN 325 MG PO TABS
650.0000 mg | ORAL_TABLET | Freq: Once | ORAL | Status: AC
  Administered 2022-05-02: 650 mg via ORAL
  Filled 2022-05-02: qty 2

## 2022-05-02 NOTE — Discharge Instructions (Signed)
You have been diagnosed with COVID-19, your x-ray shows that you probably have some COVID in your lungs which is consistent with having pneumonia from the COVID.  It is very small but it may get bigger in which case she may feel increasing pain difficulty breathing fevers vomiting or weakness in which case I do want you to come back to the hospital.  In the meantime please take the following medications and follow-up with your doctor in 3 days for recheck  Doxycycline is an antibiotic which is taken twice a day, this treats bacterial infections that can cause staph infections, it treats sinus infections and some pneumonia.  In this case I would like for you to take the antibiotic exactly as prescribed until it is completed.  Please be aware that occasionally people will get a rash if they are in the sunlight for extended periods of time while taking this medicine.  Molnupiravir, take this twice a day for the next 5 days  Thank you for allowing [place] to treat you in the emergency department today.  After reviewing your examination and potential testing that was done it appears that you are safe to go home.  I would like for you to follow-up with your doctor within the next several days, have them obtain your results and follow-up with them to review all of these tests.  If you should develop severe or worsening symptoms return to the emergency department immediately  Please make sure that your dialysis center is aware that you have COVID-19 so they can make the appropriate arrangements.

## 2022-05-02 NOTE — ED Notes (Signed)
This RN reviewed discharge instructions with patient. She verbalized understanding and denied any further questions. PT well appearing upon discharge and reports tolerable pain. Pt wheeled to exit. Pt endorses ride home.

## 2022-05-02 NOTE — ED Triage Notes (Signed)
Pt arrived POV from urgent care stating she had not been feeling well for a few days. Pt tested positive for COVID at urgent care but due to her elevated heart rate they told her to come to the ED.

## 2022-05-02 NOTE — ED Provider Triage Note (Signed)
Emergency Medicine Provider Triage Evaluation Note  Barbara Crawford , a 67 y.o. female  was evaluated in triage.  Pt complains of cough, fever, chills, malaise, having some blood-streaked sputum today as well.  Seen in urgent care doses of COVID but told to come to the ED for elevated heart rate.  Has not had Tylenol since last night.  Review of Systems  Positive: Fever, Chills, cough Negative: Chest pain  Physical Exam  BP (!) 145/80 (BP Location: Right Arm)   Pulse (!) 117   Temp (!) 102.8 F (39.3 C) (Oral)   Resp (!) 22   Ht 5\' 3"  (1.6 m)   Wt 53.1 kg   SpO2 95%   BMI 20.73 kg/m  Gen:   Awake, no distress   Resp:  Normal effort  MSK:   Moves extremities without difficulty  Other:    Medical Decision Making  Medically screening exam initiated at 2:31 PM.  Appropriate orders placed.  Barbara Crawford was informed that the remainder of the evaluation will be completed by another provider, this initial triage assessment does not replace that evaluation, and the importance of remaining in the ED until their evaluation is complete.     Ma Rings, [place] 05/02/22 1433

## 2022-05-02 NOTE — ED Provider Notes (Signed)
EMERGENCY DEPARTMENT AT [facility] Provider Note   CSN: [id number] Arrival date & time: 05/02/22  1401     History  Chief Complaint  Patient presents with   Shortness of Breath   Covid Positive    Barbara Crawford is a 67 y.o. female.   Shortness of Breath  This patient is a very pleasant 67 year old female with end-stage renal disease on dialysis Tuesdays Thursdays and Saturdays.  She also has hypertension on metoprolol.  She presents to the hospital with a complaint of a fever and a cough which has been going on for at least 48 hours, she was initially seen at the urgent care earlier today, she had been diagnosed with COVID-19 on a home test but because of a fever of 102.5 and a slight increase in her heart rates she was referred to the emergency department.  She has had a cough productive of a little bit of yellow phlegm, she denies swelling of her legs, denies chest pain, she does have a little bit of shortness of breath but only with exertion and at rest she has an oxygen of 98% and is very comfortable.  No sore throat, no runny nose, body aches and chills have resolved.  She has not missed any dialysis last going on Saturday.    Home Medications Prior to Admission medications   Medication Sig Start Date End Date Taking? Authorizing Provider  benzonatate (TESSALON) 100 MG capsule Take 1 capsule (100 mg total) by mouth every 8 (eight) hours. 05/02/22  Yes Eber Hong, MD  doxycycline (VIBRAMYCIN) 100 MG capsule Take 1 capsule (100 mg total) by mouth 2 (two) times daily. 05/02/22  Yes Eber Hong, MD  molnupiravir EUA (LAGEVRIO) 200 mg CAPS capsule Take 4 capsules (800 mg total) by mouth 2 (two) times daily for 5 days. 05/02/22 05/07/22 Yes Eber Hong, MD  acetaminophen (TYLENOL) 500 MG tablet Take 500 mg by mouth as needed for mild pain, moderate pain or fever.    [provider]  aspirin 81 MG chewable tablet Chew 81 mg by mouth daily.     [provider]  docusate sodium (COLACE) 100 MG capsule Take 100 mg by mouth as needed for moderate constipation or mild constipation.    [provider]  ferrous sulfate 325 (65 FE) MG tablet Take 325 mg by mouth in the morning and at bedtime.    [provider]  LORazepam (ATIVAN) 0.5 MG tablet Take 0.5 mg by mouth as needed for anxiety. 10/18/21   [provider]  metoprolol succinate (TOPROL XL) 25 MG 24 hr tablet Take 0.5 tablets (12.5 mg total) by mouth daily. 01/24/22   Gaston Islam., NP  multivitamin (RENA-VIT) TABS tablet Take 1 tablet by mouth at bedtime.    [provider]      Allergies    Bee venom, Shellfish-derived products, Egg-derived products, Erythromycin, Chlorhexidine, Iodine, and Penicillins    Review of Systems   Review of Systems  Respiratory:  Positive for shortness of breath.   All other systems reviewed and are negative.   Physical Exam Updated Vital Signs BP 137/85 (BP Location: Right Arm)   Pulse (!) 110   Temp 99.5 F (37.5 C) (Oral)   Resp 16   Ht 1.6 m (5\' 3" )   Wt 53.1 kg   SpO2 96%   BMI 20.73 kg/m  Physical Exam Vitals and nursing note reviewed.  Constitutional:      General:  She is not in acute distress.    Appearance: She is well-developed.  HENT:     Head: Normocephalic and atraumatic.     Mouth/Throat:     Pharynx: No oropharyngeal exudate.  Eyes:     General: No scleral icterus.       Right eye: No discharge.        Left eye: No discharge.     Conjunctiva/sclera: Conjunctivae normal.     Pupils: Pupils are equal, round, and reactive to light.  Neck:     Thyroid: No thyromegaly.     Vascular: No JVD.  Cardiovascular:     Rate and Rhythm: Regular rhythm. Tachycardia present.     Heart sounds: Normal heart sounds. No murmur heard.    No friction rub. No gallop.  Pulmonary:     Effort: Pulmonary effort is normal. No respiratory distress.     Breath sounds: Normal breath sounds.  No wheezing or rales.  Abdominal:     General: Bowel sounds are normal. There is no distension.     Palpations: Abdomen is soft. There is no mass.     Tenderness: There is no abdominal tenderness.  Musculoskeletal:        General: No tenderness. Normal range of motion.     Cervical back: Normal range of motion and neck supple.     Right lower leg: No edema.     Left lower leg: No edema.  Lymphadenopathy:     Cervical: No cervical adenopathy.  Skin:    General: Skin is warm and dry.     Findings: No erythema or rash.  Neurological:     Mental Status: She is alert.     Coordination: Coordination normal.  Psychiatric:        Behavior: Behavior normal.     ED Results / Procedures / Treatments   Labs (all labs ordered are listed, but only abnormal results are displayed) Labs Reviewed  COMPREHENSIVE METABOLIC PANEL - Abnormal; Notable for the following components:      Result Value   Potassium 3.4 (*)    BUN 40 (*)    Creatinine, Ser 8.78 (*)    Calcium 8.5 (*)    Albumin 2.5 (*)    GFR, Estimated 5 (*)    Anion gap 16 (*)    All other components within normal limits  CBC WITH DIFFERENTIAL/PLATELET - Abnormal; Notable for the following components:   WBC 10.6 (*)    Hemoglobin 11.4 (*)    MCHC 28.1 (*)    RDW 15.6 (*)    Platelets 144 (*)    Neutro Abs 8.0 (*)    Abs Immature Granulocytes 0.24 (*)    All other components within normal limits  I-STAT CHEM 8, ED - Abnormal; Notable for the following components:   BUN 37 (*)    Creatinine, Ser 10.40 (*)    Calcium, Ion 1.03 (*)    Hemoglobin 11.9 (*)    HCT 35.0 (*)    All other components within normal limits    EKG EKG Interpretation  Date/Time:  Monday May 02 2022 14:42:23 EDT Ventricular Rate:  119 PR Interval:  88 QRS Duration: 84 QT Interval:  446 QTC Calculation: 627 R Axis:   -68 Text Interpretation:  Critical Test Result: Long QTc Sinus tachycardia with short PR Left anterior fascicular block Moderate  voltage criteria for LVH, may be normal variant ( R in aVL , Cornell product ) Anterior infarct , age undetermined T  wave abnormality, consider lateral ischemia Prolonged QT Abnormal ECG When compared with ECG of 12-Dec-2021 08:06, PREVIOUS ECG IS PRESENT QT possibley longer - flat t waves prevent accurate eval of this Confirmed by Eber Hong ([id number]) on 05/02/2022 3:22:31 PM  Radiology DG Chest 2 View  Result Date: 05/02/2022 CLINICAL DATA:  Fever and cough EXAM: CHEST - 2 VIEW COMPARISON:  12/10/2021 FINDINGS: Cardiomegaly. Pulmonary infiltrate in the right upper lobe and left lower lobe consistent with multifocal bronchopneumonia. No measurable effusion. Aortic atherosclerosis is noted. No acute bone finding. IMPRESSION: Multifocal bronchopneumonia in the right upper lobe and left lower lobe. Electronically Signed   By: Paulina Fusi M.D.   On: 05/02/2022 15:07    Procedures Procedures    Medications Ordered in ED Medications  ibuprofen (ADVIL) tablet 800 mg (800 mg Oral Not Given 05/02/22 1700)  doxycycline (VIBRA-TABS) tablet 100 mg (has no administration in time range)  acetaminophen (TYLENOL) tablet 650 mg (650 mg Oral Given 05/02/22 1436)    ED Course/ Medical Decision Making/ A&P                             Medical Decision Making Amount and/or Complexity of Data Reviewed ECG/medicine tests: ordered.  Risk Prescription drug management.    This patient presents to the ED for concern of coughing and shortness of breath differential diagnosis includes pneumonia, pneumothorax, COVID-pneumonia, pulmonary embolism, congestive heart failure    Additional history obtained:  Additional history obtained from patient and the family member as well as the medical record External records from outside source obtained and reviewed including multiple prior office visits, has been seen at different institutions including [facility], cardiology, has been treated multiple times for  upper respiratory illnesses and pneumonia over the last year and in fact had a right and left heart catheterization was performed in October 2023, this showed that she had single-vessel obstructive coronary disease, medical management was planned due to 1 single 80% lesion in a second diagonal Echocardiogram performed on October 06, 2021 showed that the patient had an ejection fraction of 25%, global hypokinesis   Lab Tests:  I Ordered, and personally interpreted labs.  The pertinent results include: CBC without significant leukocytosis, metabolic panel without hyper kalemia, creatinine is appropriately elevated, no significant anemia   Imaging Studies ordered:  I ordered imaging studies including chest x-ray, 2 view PA and lateral I independently visualized and interpreted imaging which showed subtle infiltrates in the right upper and left lower lobes.  These are very hard to see but I do recognize and a slight abnormality I agree with the radiologist interpretation   Medicines ordered and prescription drug management:  I ordered medication including antivirals and antibiotics for COVID-pneumonia or potential early bacterial pneumonia in this patient who is on dialysis.  Her fever has defervesced and her heart rate is improved, she is currently at a heart rate of 105 feeling no shortness of breath with an oxygen of 97 to 98% Reevaluation of the patient after these medicines showed that the patient improved I have reviewed the patients home medicines and have made adjustments as needed   Problem List / ED Course:  Patient informed that she would be able to go home, she is very happy by this and states that she will come back if anything gets worse, I discussed her care with pharmacy who recommends the medication management as prescribed   Social Determinants of Health:  End-stage renal disease on dialysis   I have discussed with the patient at the bedside the results, and the meaning  of these results.  They have expressed her understanding to the need for follow-up with primary care physician           Final Clinical Impression(s) / ED Diagnoses Final diagnoses:  COVID-19    Rx / DC Orders ED Discharge Orders          Ordered    doxycycline (VIBRAMYCIN) 100 MG capsule  2 times daily        05/02/22 1733    molnupiravir EUA (LAGEVRIO) 200 mg CAPS capsule  2 times daily        05/02/22 1733    benzonatate (TESSALON) 100 MG capsule  Every 8 hours        05/02/22 1733              Eber Hong, MD 05/02/22 1735

## 2022-05-25 ENCOUNTER — Ambulatory Visit: Payer: Medicare HMO

## 2022-06-20 ENCOUNTER — Ambulatory Visit: Payer: Medicare HMO

## 2022-06-29 ENCOUNTER — Telehealth: Payer: Self-pay | Admitting: Interventional Cardiology

## 2022-06-29 NOTE — Telephone Encounter (Signed)
Pt called requesting appointment with Ernest,NP to complete Verification of Chronic Condition document. Pt sated she is not able to drop off the form,  [employer] has requested  the provider to verify pt diagnosis of Congestive Heart Failure. The document has to be completed and faxed by 07/04/2022. Advised pt APP is currently not in the office and will return after 07/04/22.  Explained to the pt our process to requested forms; pay a fee,  the documents are dropped off at the front desk, we have up to  10 business days complete the document. Pt stated her new health insurance  policy begins on 7/1. Will forward to APP for advise.

## 2022-06-29 NOTE — Telephone Encounter (Signed)
Patient has insurance documents she will need signed and wants to come in on Friday.

## 2022-06-29 NOTE — Telephone Encounter (Signed)
I spoke with patient and explained paperwork process to her and let her know paperwork was not usually completed during an office visit.  Patient does not have an office visit scheduled.  She wants to come in on Friday this week and wait for paperwork to be completed.  She is unable to get paperwork to office prior to Friday.  She reports she needs this paperwork completed or she will not have insurance after 7/1.  I told patient I would forward to management for follow up.

## 2022-06-30 NOTE — Telephone Encounter (Signed)
Reviewed with nurse manager and will see if last office note will be sufficient.  I spoke with patient.  She will bring paperwork to office tomorrow. She is aware of 10 business day time frame to complete.  I told patient last office note could be faxed to [facility] tomorrow if she would like.  Patient would like to have this note faxed. She will provide fax number tomorrow when bringing in the paperwork.

## 2022-07-04 NOTE — Telephone Encounter (Signed)
Spoke to the pt, explained for the office to complete her documents for [facility] payment is the date the document is dropped off at the office. Pt voiced understanding.

## 2022-07-04 NOTE — Telephone Encounter (Signed)
Patient is requesting call back to inquire about the price of paperwork.

## 2022-07-11 ENCOUNTER — Telehealth: Payer: Self-pay | Admitting: Nurse Practitioner

## 2022-07-11 NOTE — Telephone Encounter (Signed)
Pt dropped off vcc to be signed by provider/providers office. Pw will be in providers box by eod.  Please fax form to [phone] when complete. And please call pt she may want to come and pick original copy up or have it be mailed.

## 2022-07-13 NOTE — Progress Notes (Deleted)
Patient ID: Barbara Crawford                 DOB: [birth year]                      MRN: [id number]     HPI: Barbara Crawford is a 67 y.o. female patient of Dr Eldridge Dace referred by Robin Searing, NP, to pharmacy clinic for HF medication management. PMH is significant for CHF, ESRD, HTN, anxiety. Most recent LVEF 25% on 10/06/2021.  Patient was hospitalized in 10/2021 for acute on chronic CHF with elevated troponins. She underwent R/LHC at the time, which showed 80% second diagonal lesion that was small caliber with minimal CAD and mildly elevated LV filling pressures with mild pulmonary HTN. Medical management was indicated for treatment. Patient was initiated on metoprolol succinate and BiDil given ESRD.   At last visit with Robin Searing, NP, patient reported doing well from a symptom standpoint and tolerating her medications well. Her BP at the time was 126/72 mmHg and HR was 90 bpm. Metoprolol succinate and BiDil were continued at this time. However, it seems that the patient was switched from BiDil to hydralazine in 11/2021 due to debilitating headaches. She has not filled hydralazine since 11/29/2021.  Pt referred to PharmD back in January 2024. She has canceled 5 PharmD appts since then.  Discussion with patient today included the following: cardiac medication indications, review of GDMT including reasoning behind medication titration, importance of medication adherence, and patient engagement. ***Denies dizziness, fatigue, LE edema, or SOB. Able to complete all ADLs. *** checks daily weights at home. *** adheres to a low-sodium diet.  Sees ernest next month  Current CHF meds: metoprolol succinate 12.5 mg daily, BiDil   BP goal: < 130/80 mmHg  Family History: Mother - dementia; sister - DM; son - HTN.  Social History: No smoking or alcohol use.  Diet:   Exercise:   Home BP readings:   Wt Readings from Last 3 Encounters:  05/02/22 117 lb (53.1 kg)  01/24/22 111 lb 6.4 oz  (50.5 kg)  12/12/21 107 lb 5.8 oz (48.7 kg)   BP Readings from Last 3 Encounters:  05/02/22 137/85  01/24/22 126/72  12/13/21 (!) 103/57   Pulse Readings from Last 3 Encounters:  05/02/22 (!) 110  01/24/22 90  12/13/21 92    Renal function: CrCl cannot be calculated (Patient's most recent lab result is older than the maximum 21 days allowed.).  Past Medical History:  Diagnosis Date   Anxiety    ESRD (end stage renal disease) on dialysis (HCC)    Heart failure with mildly reduced ejection fraction (HFmrEF) (HCC)    Hypertension     Current Outpatient Medications on File Prior to Visit  Medication Sig Dispense Refill   acetaminophen (TYLENOL) 500 MG tablet Take 500 mg by mouth as needed for mild pain, moderate pain or fever.     aspirin 81 MG chewable tablet Chew 81 mg by mouth daily.     benzonatate (TESSALON) 100 MG capsule Take 1 capsule (100 mg total) by mouth every 8 (eight) hours. 21 capsule 0   docusate sodium (COLACE) 100 MG capsule Take 100 mg by mouth as needed for moderate constipation or mild constipation.     doxycycline (VIBRAMYCIN) 100 MG capsule Take 1 capsule (100 mg total) by mouth 2 (two) times daily. 20 capsule 0   ferrous sulfate 325 (65 FE) MG tablet Take 325 mg by mouth  in the morning and at bedtime.     LORazepam (ATIVAN) 0.5 MG tablet Take 0.5 mg by mouth as needed for anxiety.     metoprolol succinate (TOPROL XL) 25 MG 24 hr tablet Take 0.5 tablets (12.5 mg total) by mouth daily. 45 tablet 2   multivitamin (RENA-VIT) TABS tablet Take 1 tablet by mouth at bedtime.     No current facility-administered medications on file prior to visit.    Allergies  Allergen Reactions   Bee Venom Anaphylaxis   Shellfish-Derived Products Anaphylaxis, Swelling and Other (See Comments)    Sweat and Dizzy. Lobster    Egg-Derived Products Nausea Only   Erythromycin Nausea Only and Other (See Comments)    Cramps    Chlorhexidine Rash   Iodine Rash and Other (See  Comments)    Arm flares up per pt   Penicillins Rash    Has patient had a PCN reaction causing immediate rash, facial/tongue/throat swelling, SOB or lightheadedness with hypotension: no Has patient had a PCN reaction causing severe rash involving mucus membranes or skin necrosis: yes Has patient had a PCN reaction that required hospitalization: no Has patient had a PCN reaction occurring within the last 10 years: no If all of the above answers are "NO", then may proceed with Cephalosporin use.     Assessment/Plan:  1. CHF -

## 2022-07-15 ENCOUNTER — Ambulatory Visit: Payer: Medicare HMO

## 2022-07-25 NOTE — Telephone Encounter (Signed)
Pt sent in MyChart message on 07/25/22 about paperwork. Paperwork being re-faxed and hard copy put up front was addressed in message. This closes this encounter. Please see MyChart message 07/25/22. Thank you.

## 2022-07-25 NOTE — Telephone Encounter (Signed)
Recalled Pt. Phone rang but no answer or VM. I went ahead and re-faxed paperwork to [phone]. Paperwork printed if pt would like to pick up. Will try to call again.

## 2022-07-25 NOTE — Telephone Encounter (Signed)
Pt called in about her paperwork. She would like an update on where it is or if she needs to come pick it up. Tee, CMA is out this week, unsure if anyone else is covering her. Please advise.

## 2022-08-08 ENCOUNTER — Ambulatory Visit: Payer: Medicare HMO | Attending: Cardiovascular Disease | Admitting: Pharmacist

## 2022-08-08 VITALS — BP 150/70 | HR 79 | Wt 120.0 lb

## 2022-08-08 DIAGNOSIS — I5043 Acute on chronic combined systolic (congestive) and diastolic (congestive) heart failure: Secondary | ICD-10-CM

## 2022-08-08 DIAGNOSIS — I10 Essential (primary) hypertension: Secondary | ICD-10-CM | POA: Diagnosis not present

## 2022-08-08 MED ORDER — METOPROLOL SUCCINATE ER 25 MG PO TB24: 25.0000 mg | ORAL_TABLET | Freq: Every day | ORAL | 3 refills | Status: DC

## 2022-08-08 NOTE — Progress Notes (Signed)
Patient ID: Lyna Odea                 DOB: 06-16-1955                      MRN: [id number]     HPI: Barbara Crawford is a 67 y.o. female patient of Dr Eldridge Dace referred by Robin Searing, NP, to pharmacy clinic for HF medication management. PMH is significant for CHF, ESRD on dialysis, HTN, nonobstructive CAD on cath, and anxiety. Most recent LVEF 25% on 10/06/2021.  Patient was hospitalized in 10/2021 for acute on chronic CHF with elevated troponins. She underwent R/LHC at the time, which showed 80% second diagonal lesion that was small caliber with minimal CAD and mildly elevated LV filling pressures with mild pulmonary HTN. Medical management was indicated for treatment. Patient was initiated on metoprolol succinate and Bidil given ESRD.   At last visit with Alden Server in January, patient reported doing well from a symptom standpoint and tolerating her medications well. Her BP at the time was 126/72 mmHg and HR was 90 bpm. Metoprolol succinate and Bidil were continued at this time. However, it seems that the patient was switched from Bidil to hydralazine in 11/2021 due to debilitating headaches, however hydralazine was then removed from her med list in 11/2021.  Pt referred to PharmD back in January 2024. She has canceled 6 PharmD appts since then secondary to having pneumonia 3x this year as well as COVID.  Pt presents today in good spirits. Reports tolerating her medications well. Denies dizziness, headache, and LE edema. Checks her BP twice daily at home. Lowest reading she's seen was 120/57,  recalls another of 149/76, HR in the 70s. Took her metoprolol 2 hours ago. On dialysis days, takes this after dialysis sessions. Hasn't had any low BPs at dialysis. Reports BP usually higher in the office due to feeling nervous. On home O2. Reported headache on both Bidil and hydralazine. 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  Current CHF meds: metoprolol succinate 12.5 mg  daily Previously tried: Bidil, hydralazine - headaches BP goal: < 130/80 mmHg  Family History: Mother - dementia; sister - DM; son - HTN.  Social History: No smoking or alcohol use.  Diet: 32 oz fluid restriction per day per renal. Watches her salt, uses Mrs Sharilyn [employer]  Exercise: on dialysis and home O2  Wt Readings from Last 3 Encounters:  05/02/22 117 lb (53.1 kg)  01/24/22 111 lb 6.4 oz (50.5 kg)  12/12/21 107 lb 5.8 oz (48.7 kg)   BP Readings from Last 3 Encounters:  05/02/22 137/85  01/24/22 126/72  12/13/21 (!) 103/57   Pulse Readings from Last 3 Encounters:  05/02/22 (!) 110  01/24/22 90  12/13/21 92    Renal function: CrCl cannot be calculated (Patient's most recent lab result is older than the maximum 21 days allowed.).  Past Medical History:  Diagnosis Date   Anxiety    ESRD (end stage renal disease) on dialysis (HCC)    Heart failure with mildly reduced ejection fraction (HFmrEF) (HCC)    Hypertension     Current Outpatient Medications on File Prior to Visit  Medication Sig Dispense Refill   acetaminophen (TYLENOL) 500 MG tablet Take 500 mg by mouth as needed for mild pain, moderate pain or fever.     aspirin 81 MG chewable tablet Chew 81 mg by mouth daily.     benzonatate (TESSALON)  100 MG capsule Take 1 capsule (100 mg total) by mouth every 8 (eight) hours. 21 capsule 0   docusate sodium (COLACE) 100 MG capsule Take 100 mg by mouth as needed for moderate constipation or mild constipation.     doxycycline (VIBRAMYCIN) 100 MG capsule Take 1 capsule (100 mg total) by mouth 2 (two) times daily. 20 capsule 0   ferrous sulfate 325 (65 FE) MG tablet Take 325 mg by mouth in the morning and at bedtime.     LORazepam (ATIVAN) 0.5 MG tablet Take 0.5 mg by mouth as needed for anxiety.     metoprolol succinate (TOPROL XL) 25 MG 24 hr tablet Take 0.5 tablets (12.5 mg total) by mouth daily. 45 tablet 2   multivitamin (RENA-VIT) TABS tablet Take 1 tablet by mouth at bedtime.      No current facility-administered medications on file prior to visit.    Allergies  Allergen Reactions   Bee Venom Anaphylaxis   Shellfish-Derived Products Anaphylaxis, Swelling and Other (See Comments)    Sweat and Dizzy. Lobster    Egg-Derived Products Nausea Only   Erythromycin Nausea Only and Other (See Comments)    Cramps    Chlorhexidine Rash   Iodine Rash and Other (See Comments)    Arm flares up per pt   Penicillins Rash    Has patient had a PCN reaction causing immediate rash, facial/tongue/throat swelling, SOB or lightheadedness with hypotension: no Has patient had a PCN reaction causing severe rash involving mucus membranes or skin necrosis: yes Has patient had a PCN reaction that required hospitalization: no Has patient had a PCN reaction occurring within the last 10 years: no If all of the above answers are "NO", then may proceed with Cephalosporin use.     Assessment/Plan:  1. HFrEF with EF 25% - BP elevated today with room to titrate her metoprolol. Will increase Toprol from 12.5mg  to 25mg  daily. Hopefully can titrate further at follow up appt with Alden Server in a few weeks. Avoiding SGLT2i, MRA, and ARNI due to dialysis so her beta blocker is her only HF med that can be optimized. Previously intolerant to Bidil and hydralazine (headache). F/u with PharmD as needed.   E. , PharmD, BCACP, CPP [facility] [address], [place], [place] [address] Phone: [phone]; Fax: [phone] 08/08/2022 10:04 AM

## 2022-08-08 NOTE — Patient Instructions (Signed)
Your blood pressure goal is < 130/40mmHg  Increase your metoprolol from 1/2 tablet to 1 tablet daily  Continue taking your other medications  Keep your follow up with Alden Server later this month

## 2022-08-18 NOTE — Progress Notes (Signed)
Office Visit    Patient Name: Barbara Crawford Date of Encounter: 08/19/2022  Primary Care Provider:  Chyrel Masson Primary Cardiologist:  Lance Muss, MD Primary Electrophysiologist: None   Past Medical History    Past Medical History:  Diagnosis Date   Anxiety    ESRD (end stage renal disease) on dialysis (HCC)    Heart failure with mildly reduced ejection fraction (HFmrEF) (HCC)    Hypertension    Past Surgical History:  Procedure Laterality Date   AV FISTULA PLACEMENT Left 03/13/2020   Procedure: Creation of Left Brachiocephalic fistula;  Surgeon: Cephus Shelling, MD;  Location: [facility] OR;  Service: Vascular;  Laterality: Left;   IR FLUORO GUIDE CV LINE RIGHT  02/26/2020   IR FLUORO GUIDE CV LINE RIGHT  03/09/2020   IR US GUIDE VASC ACCESS RIGHT  02/26/2020   IR US GUIDE VASC ACCESS RIGHT  03/09/2020   RIGHT/LEFT HEART CATH AND CORONARY ANGIOGRAPHY N/A 10/07/2021   Procedure: RIGHT/LEFT HEART CATH AND CORONARY ANGIOGRAPHY;  Surgeon: [place]az[place], Peter M, MD;  Location: [facility] INVASIVE CV LAB;  Service: Cardiovascular;  Laterality: N/A;    Allergies  Allergies  Allergen Reactions   Bee Venom Anaphylaxis   Shellfish-Derived Products Anaphylaxis, Swelling and Other (See Comments)    Sweat and Dizzy. Lobster    Azithromycin Hives    hives   Bidil [Isosorb Dinitrate-Hydralazine]     headache   Chlorhexidine Rash   Egg-Derived Products Nausea Only   Erythromycin Nausea Only and Other (See Comments)    Cramps    Hydralazine     headache   Iodine Rash and Other (See Comments)    Arm flares up per pt   Penicillins Rash    Has patient had a PCN reaction causing immediate rash, facial/tongue/throat swelling, SOB or lightheadedness with hypotension: no Has patient had a PCN reaction causing severe rash involving mucus membranes or skin necrosis: yes Has patient had a PCN reaction that required hospitalization: no Has patient had a PCN reaction occurring within  the last 10 years: no If all of the above answers are "NO", then may proceed with Cephalosporin use.     History of Present Illness    Barbara Crawford is a 67 y.o. 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   Barbara Crawford was seen initially in 10/26/21 for acute CHF. In the Ed EKG was completed and showed sinus tach with left anterior fascicular block, LVH with nonspecific T wave changes.  Patient's high-sensitivity troponin was 64>>74 and chest x-ray revealed cardiomegaly with pulmonary edema and small left-sided pleural effusion.  Patient has history of ESRD and was taken to HD for urgent dialysis.  2D echo 08/2020 showed LVEF of 45%, global hypokinesis, g2DD, normal RV size and function, mild to moderate MR.  She underwent R/LHC for further evaluation that revealed 80% second diagonal lesion that was small caliber with minimal CAD and medical management was indicated.  She was started on BiDil, metoprolol and was last seen on 10/25/2021 for follow-up.  During her  visit patient's blood pressures were elevated patient reported not taking metoprolol prior to his scheduled visit.  There were no changes made to medications at that time with plans to monitor at home.  Since last being seen in the office patient reports she is still recovering from a severe bout of pneumonia and COVID that occurred in  April.  She has noticed an improved appetite and her dry weight has recently been increased to 121 pounds.  She was recently seen by our Pharm.D. and was advised to increase her metoprolol to 25 mg twice daily.  She was hesitant to increase her medication and wanted to discuss further today.  Her blood pressure today was controlled at 130/70 and heart rate today is 87 bpm.  She has experienced some bouts of anxiety and depression since her previous visit.  She recently was fired from her job working at [employer] and has experience  in a recent loss in her family.  She has had some sodium indiscretions and ate recently hotdogs and pork and beans.  She is slightly volume up on examination today 3 pounds compared to her dry weight.  We discussed in detail the importance of following a low-sodium diet and she was very receptive.  Shedenies chest pain, palpitations, dyspnea, PND, orthopnea, nausea, vomiting, dizziness, syncope, edema, weight gain, or early satiety.    Home Medications    Current Outpatient Medications  Medication Sig Dispense Refill   acetaminophen (TYLENOL) 500 MG tablet Take 500 mg by mouth as needed for mild pain, moderate pain or fever.     aspirin 81 MG chewable tablet Chew 81 mg by mouth daily.     ferric citrate (AURYXIA) 1 GM 210 MG(Fe) tablet Take 420 mg by mouth 3 (three) times daily with meals.     LORazepam (ATIVAN) 0.5 MG tablet Take 0.5 mg by mouth as needed for anxiety.     metoprolol succinate (TOPROL XL) 25 MG 24 hr tablet Take 1 tablet (25 mg total) by mouth daily. 90 tablet 3   multivitamin (RENA-VIT) TABS tablet Take 1 tablet by mouth at bedtime.     No current facility-administered medications for this visit.     Review of Systems  Please see the history of present illness.    (+) Depression and anxiety  (+) Lower extremity swelling  All other systems reviewed and are otherwise negative except as noted above.  Physical Exam    Wt Readings from Last 3 Encounters:  08/19/22 124 lb (56.2 kg)  08/08/22 120 lb (54.4 kg)  05/02/22 117 lb (53.1 kg)   VS: Vitals:   08/19/22 0910  BP: 130/70  Pulse: 87  SpO2: 98%  ,Body mass index is 21.97 kg/m.  Constitutional:      Appearance: Healthy appearance. Not in distress.  Neck:     Vascular: JVD normal.  Pulmonary:     Effort: Pulmonary effort is normal.     Breath sounds: No wheezing. No rales. Diminished in the bases Cardiovascular:     Normal rate. Regular rhythm. Normal S1. Normal S2.      Murmurs: There is no murmur.   Edema:    Bilateral +1 lower extremity edema Abdominal:     Palpations: Abdomen is soft non tender. There is no hepatomegaly.  Skin:    General: Skin is warm and dry.  Neurological:     General: No focal deficit present.     Mental Status: Alert and oriented to person, place and time.     Cranial Nerves: Cranial nerves are intact.  EKG/LABS/ Recent Cardiac Studies    ECG personally reviewed by me today -none completed today   Lab Results  Component Value Date   WBC 10.6 (H) 05/02/2022   HGB 11.9 (L) 05/02/2022   HCT 35.0 (L) 05/02/2022   MCV 99.3 05/02/2022  PLT 144 (L) 05/02/2022   Lab Results  Component Value Date   CREATININE 10.40 (H) 05/02/2022   BUN 37 (H) 05/02/2022   NA 138 05/02/2022   K 3.7 05/02/2022   CL 104 05/02/2022   CO2 23 05/02/2022   Lab Results  Component Value Date   ALT 11 05/02/2022   AST 17 05/02/2022   ALKPHOS 38 05/02/2022   BILITOT 0.8 05/02/2022   Lab Results  Component Value Date   CHOL 204 (H) 02/27/2020   HDL 52 02/27/2020   LDLCALC 129 (H) 02/27/2020   TRIG 68 08/16/2020   CHOLHDL 3.9 02/27/2020    Lab Results  Component Value Date   HGBA1C 5.4 08/15/2020     Assessment & Plan    1.  Combined chronic systolic and diastolic CHF: -2D echo was performed showing severely decreased LV function and EF of 25% with global hypokinesis with mild concentric LVH and grade 1 DD, with mildly reduced LV  -Today patient is slightly volume up and reports some indiscretions with salt in her diet recently. -She reports that her dry weight was recently increased at dialysis and volume is managed per session. -We will increase metoprolol to 25 mg daily and patient was advised to monitor heart rate and blood pressure. -We also discussed rechallenge in BiDil and hydralazine to see if headaches resolve in order to improve GDMT. -We will refer to for assistance with lifestyle management and disease management.   2.  Essential  hypertension: -Patient's blood pressure today was controlled at 130/70 -Continue metoprolol 25 mg daily   3.  Anxiety: -Patient reports increased anxiety and bouts of worry and depression since losing her job and experiencing a recent loss in her family.  4.  Nonobstructive CAD: -R/LHC performed with 80% second diagonal lesion that was small caliber with minimal CAD -Continue ASA 81 mg and Toprol 25 mg daily   5. ESRD: -HD on HD TTS per nephrology -Patient has new dry weight and volume is managed per HD session  Disposition: Follow-up with Lance Muss, MD or APP in 3 months    Medication Adjustments/Labs and Tests Ordered: Current medicines are reviewed at length with the patient today.  Concerns regarding medicines are outlined above.   Signed, Napoleon Form, Leodis Rains, NP 08/19/2022, 9:19 AM [facility]

## 2022-08-19 ENCOUNTER — Encounter: Payer: Self-pay | Admitting: Nurse Practitioner

## 2022-08-19 ENCOUNTER — Ambulatory Visit: Payer: Medicare HMO | Attending: Nurse Practitioner | Admitting: Nurse Practitioner

## 2022-08-19 ENCOUNTER — Telehealth: Payer: Self-pay

## 2022-08-19 VITALS — BP 130/70 | HR 87 | Ht 63.0 in | Wt 124.0 lb

## 2022-08-19 DIAGNOSIS — I5043 Acute on chronic combined systolic (congestive) and diastolic (congestive) heart failure: Secondary | ICD-10-CM

## 2022-08-19 DIAGNOSIS — N186 End stage renal disease: Secondary | ICD-10-CM

## 2022-08-19 DIAGNOSIS — I10 Essential (primary) hypertension: Secondary | ICD-10-CM

## 2022-08-19 DIAGNOSIS — F419 Anxiety disorder, unspecified: Secondary | ICD-10-CM

## 2022-08-19 DIAGNOSIS — I2510 Atherosclerotic heart disease of native coronary artery without angina pectoris: Secondary | ICD-10-CM

## 2022-08-19 DIAGNOSIS — I428 Other cardiomyopathies: Secondary | ICD-10-CM

## 2022-08-19 DIAGNOSIS — Z0000 Encounter for general adult medical examination without abnormal findings: Secondary | ICD-10-CM

## 2022-08-19 NOTE — Telephone Encounter (Signed)
Called patient per health coaching referral for medication compliance from Robin Searing, NP. Patient did not answer. Left message informing patient that I have scheduled a time to call them back on 08/22/2022 at 9:30am. Provided patient my contact information as well.   Renaee Munda, MS, ERHD, [name]  Care Guide, Health & Wellness Coach [address], [address] [place] [address] Telephone: [phone] Email: [email or web address]

## 2022-08-19 NOTE — Patient Instructions (Addendum)
Medication Instructions:  INCREASE Metoprolol to 25mg  take 1 tablet once a day  *If you need a refill on your cardiac medications before your next appointment, please call your pharmacy*   Lab Work: None ordered   Testing/Procedures: None ordered   Follow-Up: At [facility], you and your health needs are our priority.  As part of our continuing mission to provide you with exceptional heart care, we have created designated Provider Care Teams.  These Care Teams include your primary Cardiologist (physician) and Advanced Practice Providers (APPs -  Physician Assistants and Nurse Practitioners) who all work together to provide you with the care you need, when you need it.  We recommend signing up for the patient portal called "MyChart".  Sign up information is provided on this After Visit Summary.  MyChart is used to connect with patients for Virtual Visits (Telemedicine).  Patients are able to view lab/test results, encounter notes, upcoming appointments, etc.  Non-urgent messages can be sent to your provider as well.   To learn more about what you can do with MyChart, go to [email or web address].    Your next appointment:   3 month(s)  Provider:   Lance Muss, MD  or Robin Searing, NP   Other Instructions Your dry weight is 121 Ask your primary care provider about starting medication for mood (SSRI or SNRI)  Limit your salt intake to 1500-2000mg  per day or 500mg  of Sodium per meal. Check your blood pressure daily for two weeks then contact the office with the readings You have been referred to Care navigator You have been referred to Dr Bosie Clos.

## 2022-08-22 ENCOUNTER — Ambulatory Visit: Payer: Medicare HMO | Attending: Cardiology

## 2022-08-22 DIAGNOSIS — Z0000 Encounter for general adult medical examination without abnormal findings: Secondary | ICD-10-CM

## 2022-08-22 NOTE — Progress Notes (Signed)
HEALTH & WELLNESS COACHING INITIAL INTAKE    Appointment Outcome: Completed, Session #: Initial                        Start time: 9:31am   End time: 9:40am   Subtotal Mins: 9 minutes Start time: 9:46am   End time: 10:26am Subtotal Mins: 40 minutes   Total Mins: 49 minutes Patient called the pharmacy during session that caused a gap in health coaching session.   AGREEMENTS SECTION   What are the Patient's goals from Coaching?  *Patient was originally referred for health coaching for medication compliance.  Patient wants to focus on improving her blood pressure by reducing her sodium intake and managing stress.    Why did they seek coaching now? Patient shared her concerns regarding her blood pressure being elevated due to stress. Patient is interested in learning more about monitoring her sodium intake as well to reduce her blood pressure.     Readiness  Today the patient is in the preparation stage of these behavior changes, as action steps were co-created to work towards improving the patient's blood pressure.    Agreement Signed & Returned? Reviewed Coaching Agreement and Code of Ethics with Patient during initial session. Answered any questions the patient had if any regarding the Coaching Agreement and Code of Ethics. Patient verbally agreed to adhere to the Coaching Agreement and to abide by the Code of Ethics.  Mailed patient with a hard/electronic copy of the Coaching Agreement and Code of Ethics.     Coaching Progress Notes:  Patient shared what her concerns are regarding her elevated blood pressure. Patient stated that she has white coat syndrome and when she goes to has a doctor's visit, she has an elevated blood pressure, but not when she checks her blood pressure readings prior to going to the doctor. Patient shared that she lost her job in May and her brother-in-law passed about a month ago.   Patient stated that these are significant stressors. Patient shared  that as a result to having elevated blood pressure readings in the doctor's office compared to at home, her dosage of metoprolol was increased by the pharmacist. Patient mentioned the cost of one of medications Rena-vit is the medication that she has a challenge purchasing due to the cost. Patient mentioned that she has support from her sister at times.  Discussed with patient her calming techniques when she is feeling stressed. Patient stated that she typically watches something on tv that is funny to help take her mind off what is bothering her. Discussed with patient additional stress triggers that she is currently experiencing. Patient rated her stress level at 7 out of 10 (1=low, 5=moderate, 10=high). Patient stated that due to the level of stress she has experienced over the past three months, she has been referred to therapy by Robin Searing, NP.   Discussed with patient what she wanted to get out of health coaching. Patient is wanting to learn how to cope with stress more effectively and monitor her sodium intake. Patient feels that it is important to monitor her sodium intake because of her kidney disease in addition to her blood pressure being elevated. Patient believes that stress plays a role in her blood pressure Patient expressed that her health was important to her and that she needs to prioritize herself. Discussed with patient additional ways that she can lower stress. Patient explained that she enjoys different types of music, arts and crafts, completing  word searches and using coloring books. Patient expressed that her faith plays a pivotal role in her life.     Coaching Outcomes Discussed concerns regarding potential change to [facility] to determine if that would help lower the cost of her medications. Patient inquired if they had mail delivery due to transportation.   Will discuss alternative pharmacy choice after determining if her cost would be cheaper. Spoke with Letta Kocher., LCSW  regarding this matter. Was provided flyer for Extra Help Savings Program and how to apply for Medicaid to share with patient.   Patient co-created action steps that she is interested in implementing over the next week to aid in reducing her daily stress level and sodium intake.    Overall Goal(s): Improve blood pressure:  Reducing stress level lower than 7 out of 10 over the next month Reducing sodium intake to 1500mg  per day over the next month  Agreement/Action Steps:  Reduce stress level lower than 7 out of 10 by engaging in these activities daily and as needed Watch a funny show/movie on tv Listen to music Practice deep breathing Complete word search puzzles and/or use coloring book Make arts and crafts  Reduce sodium intake to 1500mg  per day Read food labels to monitor sodium intake per serving size Use sodium tracker sheets or MyFitnessPal app to record daily sodium consumption   Resource(s): Patient was emailed/mailed supporting educational documents to support her action steps that included deep breathing exercises, how to read food labels, sodium tracker sheets, and a stress exploration worksheet. Patient was also mailed information on the Extra Help Medicare Savings Program and how to apply for Medicaid for assistance with medication cost.    Referral(s): Patient has been referred for therapy. Patient was called and a message was left for patient to return call to schedule an appointment. Will update patient regarding this call to ensure she is connected to this resource.

## 2022-08-29 ENCOUNTER — Ambulatory Visit: Payer: Medicare HMO | Attending: Cardiology

## 2022-08-29 DIAGNOSIS — Z0000 Encounter for general adult medical examination without abnormal findings: Secondary | ICD-10-CM

## 2022-08-29 NOTE — Progress Notes (Signed)
Appointment Outcome: Completed, Session #: 1                        Start time: 10:01am   End time: 10:50am   Total Mins: 49 minutes   AGREEMENTS SECTION     Overall Goal(s): Improve blood pressure:  Reducing stress level lower than 7 out of 10 over the next month Reducing sodium intake to 1500mg  per day over the next month  Agreement/Action Steps:  Reduce stress level lower than 7 out of 10 by engaging in these activities daily and as needed Watch a funny show/movie on tv Listen to music Practice deep breathing Complete word search puzzles and/or use coloring book Make arts and crafts  Reduce sodium intake to 1500mg  per day Read food labels to monitor sodium intake per serving size Use sodium tracker sheets or MyFitnessPal app to record daily sodium consumption    Progress Notes:  Patient reported that she received the package with documents that support some of her action steps. Patient rated her stress level at a 5 out of 10 (1=low, 5=moderate, 10=high). Patient mentioned that she practices gratitude in the mornings. Patient discussed what her current stressors are but expressed that she is not as stressed as she was prior to our last session. Patient has been in self-reflection over what she is finding concerning in her life and how to go about addressing them.   Patient shared that she has expressed her thoughts with someone to minimize conflict and stress. Patient explained that she also talked herself through stressful moments. Patient stated that she has reviewed the stress exploration sheet and asked clarifying questions about the directions. Patient verbally expressed she understands how to complete the worksheet. Patient shared that she has identified a personal characteristic that is challenging to navigate and contributes to some of her stress.   Patient shared that she has been working on practicing deep breathing to manage her stress and improve her shortness of breath  when she has overexerted herself completing her chores. Patient discussed ways in which she has thought about breaking down her chores and taking breaks so that she is not overwhelmed with her daily tasks. Patient expressed that she has recognized that she cannot do it all at once. Patient described a plan that she will implement to help her reduce her stress while cleaning throughout the week.  Educated patient on how to read food labels as she reviewed the documents that were mailed to her. Discussed with patient the differences between using the sodium tracker sheets compared to MyFitnessPal. Patient mentioned that she also needs to track her fluid intake due to having a restriction of 32 ounces per day. Patient expressed that she is not sure how much fluid is in food, which counts towards her daily intake.   Shared with patient that behavioral health reached out to her and left a voicemail and MyChart message to call to schedule. Discussed with patient the information provided regarding applying for Medicaid and Extra Help through Medicare. Patient shared that in the meantime, she was able to get support to purchase her vitamins.    Indicators of Success and Accountability:  Patient stressed level has reduced to 5/10 compared to 7/10 within one week.   Readiness: Patient is in the action stage of stress management.   Strengths and Supports: Patient's family is her support system. Patient is relying on being self-sufficient.  Challenges and Barriers: Patient is dealing with multiple stressors  at once where she feels the need for additional therapeutic support.    Coaching Outcomes: Did not discuss all action steps with patient as outlined above that she wanted to implement  over the past week due to focus on health coaching session. Patient implementing other strategies not outlined above that has helped lower her stress.   These action steps included the following:  Agreement/Action  Steps: Reduce stress level lower than 7 out of 10 by engaging in these activities daily and as needed Being assertive Engage in self-reflection Establishing healthy boundaries  Patient wants to work on tracking her daily fluid intake.    Attempted: Fulfilled - Patient completed the bi-weekly agreement in full and was able to meet the challenge.  Resources: Mailed/emailed patient information on how to calculate fluid in foods and fluid intake tracking sheets because she has a 32-ounce daily fluid restriction.

## 2022-09-12 ENCOUNTER — Other Ambulatory Visit: Payer: Self-pay

## 2022-09-12 ENCOUNTER — Ambulatory Visit: Payer: Medicare HMO | Attending: Cardiovascular Disease

## 2022-09-12 DIAGNOSIS — N186 End stage renal disease: Secondary | ICD-10-CM

## 2022-09-12 DIAGNOSIS — Z0000 Encounter for general adult medical examination without abnormal findings: Secondary | ICD-10-CM

## 2022-09-12 DIAGNOSIS — E639 Nutritional deficiency, unspecified: Secondary | ICD-10-CM

## 2022-09-12 NOTE — Progress Notes (Signed)
Appointment Outcome:  Completed, Session #: 2                         Start time: 10:01am   End time: 10:50am   Total Mins: 49 minutes  AGREEMENTS SECTION   Overall Goal(s): Improve blood pressure:  Reducing stress level lower than 7 out of 10 over the next month Reducing sodium intake to 1500mg  per day over the next month  Agreement/Action Steps: Reduce stress level lower than 7 out of 10 by engaging in these activities daily and as needed Being assertive Engage in self-reflection Establishing healthy boundaries Watch a funny show/movie on tv Listen to music Practice deep breathing Complete word search puzzles and/or use coloring book Make arts and crafts   Reduce sodium intake to 1500mg  per day Read food labels to monitor sodium intake per serving size Use sodium tracker sheets or MyFitnessPal app to record daily sodium consumption    Progress Notes:  Patient's focused today's session on stress management over the past two weeks. We did not discuss monitoring her sodium intake over the past two weeks. However, patient has been monitoring her blood pressure at home and sent her readings via MyChart for review.  Patient shared two main incidents that caused her the most stress and elaborated on how she reduced her stress level again. Patient reported that she set boundaries with herself and others to avoid partaking in situations that would cause her more stress. Patient mentioned that being in pain after dialysis and not having support was upsetting. Patient described the range of emotions that she experienced within this time as disappointed and alone to being okay.   Patient stated that her faith has been the most effective way of managing her stress. Patient shared that she continued to watch funny television shows and engage in self-reflection to either distract herself for some time from a stressor or to analyze her response to a stressor and determine what she felt should be  her next steps or an area that she can strengthen. Patient mentioned that she has been writing in her journal about the stressful events and what she wants for herself at this time.   Patient is seeking out a therapist and is in the process of filling out paperwork to be scheduled for an appointment. Patient has been engaging in self-talk to encourage herself through stressful moments because she feels that she does not have the support that she needs. Patient mentioned that she has been practicing gratitude as well to reflect on the positive aspects of her life.   Discussed with patient the idea of participating in support groups with other who have ESRD. Patient stated that she inquired about this opportunity previously and learned that the organization no longer had someone to lead the support group. Patient stated that she is interested in participating in a virtual support group.    Indicators of Success and Accountability: Patient has been able to implement additional action steps such as journaling to help her process her stressors.   Readiness: Patient is in the action phase of stress management.  Strengths and Supports: Patient is relying on having a sense of humor and her faith.  Challenges and Barriers: Patient does not have support that she desires to deal with the stress from her health condition/treatment.     Coaching Outcomes: Patient expressed that her focus is on improving her over health and quality of life by taking better care of  herself. Patient expressed that eating healthy for her kidney health is important to her and stated that she would like to meet with someone to discuss healthy eating for her dietary deficiencies.   Patient stated that she will continue to work on moving the process forward with getting scheduled for therapy appointments.   Patient is interested in participating in virtual support groups with other patients who have ESRD. Support groups will be  researched to determine what is available to patient.  Attempted: Fulfilled - Patient completed the bi-weekly agreement in full and was able to meet the challenge.  Referrals: Sent request to Robin Searing, NP regarding patient's interest in seeing a nutritionist to discuss healthy eating habits per her diagnosis of ESRD.

## 2022-09-26 ENCOUNTER — Ambulatory Visit: Payer: Medicare HMO

## 2022-09-26 DIAGNOSIS — Z0000 Encounter for general adult medical examination without abnormal findings: Secondary | ICD-10-CM

## 2022-09-26 NOTE — Progress Notes (Signed)
Appointment Outcome: Completed, Session #: 3                        Start time: 10:00am   End time: 10:43am   Total Mins: 43 minutes  AGREEMENTS SECTION   Overall Goal(s): Improve blood pressure by:  Reducing stress level lower than 7 out of 10 over the next month Reducing sodium intake to 1500mg  per day over the next month  Agreement/Action Steps: Reduce stress level lower than 7 out of 10 by engaging in these activities daily and as needed Being assertive Engage in self-reflection Establishing healthy boundaries Watch a funny show/movie on tv Listen to music Practice deep breathing Complete word search puzzles and/or use coloring book Make arts and crafts   Reduce sodium intake to 1500mg  per day Read food labels to monitor sodium intake per serving size Use sodium tracker sheets or MyFitnessPal app to record daily sodium consumption    Progress Notes:  Patient rated her stress level today at a 4 out of 10 (1=low, 5=moderate, 10=high) compared to a 7 out of 10 two weeks prior. Patient shared that she experienced two anxiety attacks over the past two weeks that she had to talk herself through. Patient mentioned that she checked in with herself to observe how she was feeling and determined that she needed to take a moment to relax and redirect her attention. Patient shared that on one occasion, she focused on deep breathing and counting to 5. Patient mentioned that she also watched something on tv that would make her laugh and not focus on the stressor.   Patient shared that there have been other times when she must enforce her healthy boundaries and be assertive. Patient expressed that this was challenging to do and caused her some emotional stress, but she was able to process it by praying, implementing positive self-talk, and writing in her journal to practice self-reflection. Patient stated that when she writes in her journal, she reflects on how she felt in the moment she was  experiencing the stressor and where she's at emotionally at the time when she is writing.   Patient discussed how she has begun to focus on how important her overall health is and how stress impacts her blood pressure. Patient shared that she has realized that her health is more important than worrying about others. Patient mentioned that she decided that there were things that she needed to let go of from the past so that she can be at peace. Patient shared that since her perspective has shifted, she has been more focused on being aware of her health and asking her providers questions and advocating for herself during doctor visits. Patient shared that her major concern is her blood pressure and ensuring that logs her readings at home daily.   Patient stated that she has been more mindful of monitoring the sodium that she has been consuming by not using sodium when she is preparing meals at home. Patient shared that she cooks with Mrs. Dash, onion and garlic powders, and parsley. Patient stated that her challenge is eating salty foods such as chips. Patient mentioned that she has read the food labels so she can pick items that are lower in sodium such as the [place] potato chips with 95mg  of sodium per serving or the no salt chips. Patient stated that sometimes she has a craving for salt but try to refrain from consuming it in large quantities because of her kidneys  and heart functioning. Patient stated that when she eats fast food such as [place], she eats a small portion of the meal and throw the rest away. Patient shared that she prefers to cook at home so that she knows how her food is prepared.     Indicators of Success and Accountability: Patient rated her stress level at a 4 out of 10 (1=low, 5=moderate, 10=high) by implementing her action steps. Patient continues to monitor her bp and log her readings.   Readiness: Patient is in the action stage of improving her bp by reducing her stress level and  sodium intake.   Strengths and Supports: Patient is seeking therapeutic support. Patient is relying on being optimistic and assertive.   Challenges and Barriers: Patient does not foresee any challenges to implementing her action steps over the next two weeks.    Coaching Outcomes: Patient expressed that going through the health coaching program is helping her rebuild her self-esteem, have a positive attitude, and reduce her stress. Patient shared that she is still interested in participating in therapy for additional support but explained her challenges to being able to participate with the current practice of interest. Discussed with patient additional local mental health resources that may be of interest to her.   Patient did not revise her action steps to implement over the next two weeks towards reducing her stress level. Patient will implement the action steps outlined below moving forward to reduce her sodium intake.   Reduce sodium intake to 1500mg  per day Read food labels to monitor sodium intake per serving size Reduce fast food consumption by preparing meals at home Use Mrs. DASH, onion and garlic powders, and herbs to season food instead of salt   Attempted: Fulfilled - patient completed the bi-weekly agreement in full and was able to meet the challenge.    Resources: Patient was mailed a copy of local mental health resources for her review.

## 2022-10-10 ENCOUNTER — Ambulatory Visit: Payer: Medicare HMO

## 2022-10-10 ENCOUNTER — Telehealth: Payer: Self-pay

## 2022-10-10 DIAGNOSIS — Z0000 Encounter for general adult medical examination without abnormal findings: Secondary | ICD-10-CM

## 2022-10-10 NOTE — Telephone Encounter (Signed)
Called patient to hold health coaching session over the phone. Patient stated that she was unable to hold session today because she was in the process of going to hospital due to son being admitted and requested to be rescheduled. Patient has been rescheduled for 10/11 at 10:00am. Patient will be called at this time.    Renaee Munda, MS, ERHD, [facility]  Care Guide, Health & Wellness Coach [address] [place] [address] Telephone: [phone] Email: [email or web address]

## 2022-10-14 ENCOUNTER — Ambulatory Visit: Payer: Medicare HMO | Attending: Cardiology

## 2022-10-14 DIAGNOSIS — Z0000 Encounter for general adult medical examination without abnormal findings: Secondary | ICD-10-CM

## 2022-10-14 NOTE — Progress Notes (Signed)
Appointment Outcome: Completed, Session #: 4                        Start time: 10:02am   End time: 10:38am   Total Mins: 36 minutes  AGREEMENTS SECTION   Overall Goal(s): Improve blood pressure by:  Reducing stress level lower than 7 out of 10 over the next month Reducing sodium intake to 1500mg  per day over the next month  Agreement/Action Steps: Reduce stress level lower than 7 out of 10 by engaging in these activities daily and as needed Being assertive Engage in self-reflection Establishing healthy boundaries Watch a funny show/movie on tv Listen to music Practice deep breathing Complete word search puzzles and/or use coloring book Make arts and crafts   Reduce sodium intake to 1500mg  per day Read food labels to monitor sodium intake per serving size Use sodium tracker sheets or MyFitnessPal app to record daily sodium consumption      Progress Notes:  Patient shared the different stressors that she encountered over the past two weeks that caused her to feel overwhelmed for some time. Patient shared that in the moment she tried working on her responses to other people and advocate for her health, but during doing so, she had an emotional breakdown because of how stressed she was from everything that was happening at once. Patient stated that there were times that she practiced deep breathing, and it did not help her calm down. Patient mentioned that she spent more time praying and talking to her pastor for support. Patient shared that she listened to music to help her relax and watch tv to have something funny to laugh at. Patient shared that she continued to enforce her boundaries and gave an example of when she talked to a friend and when she had to be assertive about her position on a particular subject to help minimize the stress during that conversation.   Patient stated that she has talked with her sons about certain issues that she is dealing with, and they have given her  advice. Patient shared that she is grappling with the advice as a mother but understands that she does need to prioritize herself and what that means for her health if she continues to worry and continuously be stressed. Patient expressed how she is trying to become solution focused instead of focusing on what she doesn't have or what's not going right. Patient stated that during all of her stressors, she was experiencing a stress level higher than a 10 out of 10 (1=low, 5=moderate, 10=high). Patient rated her stress level today at a 5 out of 10. Patient stated that being able to talk with her support system was the most helpful in bringing her stress level down.   Indicators of Success and Accountability:  Patient was able to bring her stress level from a 10 out of 10 down to a 5 out of 10 over the past two weeks.  Readiness: Patient is in the action stage of stress management. Patient is in the preparation stage of reducing daily sodium intake.   Strengths and Supports: Patient has her pastor for spiritual support and her sons for emotional support. Patient is relying on her faith as a strength.   Challenges and Barriers: Patient is dealing with several stressors at once that she finds difficult to manage at the same time.   Coaching Outcomes: Patient inquired about the kidney support group information that was previously sent after the last  session. Patient was not aware that the support resources were emailed to her. Patient was able to find email to access support groups around kidney health. Printed a copy of resources and mailed them to patient as well. Printed additional local mental health resources for patient to look at for support and mailed them to her.   Patient did not revise her stress management strategies during this session.   Was not able to discuss the following goal/action step in detail during this session, but the patient deems it important to discuss during the next session.  Agreed to start with this topic during next session.  Reduce sodium intake to 1500mg  per day Read food labels to monitor sodium intake per serving size Use sodium tracker sheets or MyFitnessPal app to record daily sodium consumption   Attempted: Fulfilled - patient completed the bi-weekly agreement for stress management in full and was able to meet the challenge.    Not attempted: Deferred - patient expressed the agreement is important but unable to work towards this week and want to continue on the path with this agreement.   Reduce sodium intake to 1500mg  per day Read food labels to monitor sodium intake per serving size Use sodium tracker sheets or MyFitnessPal app to record daily sodium consumption

## 2022-10-18 ENCOUNTER — Telehealth: Payer: Self-pay

## 2022-10-18 DIAGNOSIS — Z0000 Encounter for general adult medical examination without abnormal findings: Secondary | ICD-10-CM

## 2022-10-18 NOTE — Telephone Encounter (Signed)
Called patient to move appointment due to double booking on 10/28 at 10:00am. Patient requested to be scheduled for 11/1 at 10:00am. Patient will be called at this time.   Renaee Munda, MS, ERHD, [facility]  Care Guide, Health & Wellness Coach [address], [address] [place] [address] Telephone: [phone] Email: [email or web address]

## 2022-10-28 ENCOUNTER — Ambulatory Visit: Payer: Self-pay

## 2022-10-31 ENCOUNTER — Ambulatory Visit: Payer: Self-pay

## 2022-11-04 ENCOUNTER — Ambulatory Visit: Payer: Medicare HMO | Attending: Cardiovascular Disease

## 2022-11-04 DIAGNOSIS — Z0000 Encounter for general adult medical examination without abnormal findings: Secondary | ICD-10-CM

## 2022-11-04 NOTE — Progress Notes (Signed)
Appointment Outcome: Completed, Session #: 5                         Start time: 10:01am   End time: 10:49am   Total Mins: 48 minutes  AGREEMENTS SECTION   Overall Goal(s): Improve blood pressure by:  Reducing stress level lower than 7 out of 10 over the next month Reducing sodium intake to 1500mg  per day over the next month  Agreement/Action Steps: Reduce stress level lower than 7 out of 10 by engaging in these activities daily and as needed Being assertive Engage in self-reflection Establishing healthy boundaries Watch a funny show/movie on tv Listen to music Practice deep breathing Complete word search puzzles and/or use coloring book Make arts and crafts   Reduce sodium intake to 1500mg  per day Read food labels to monitor sodium intake per serving size Use sodium tracker sheets or MyFitnessPal app to record daily sodium consumption    Progress Notes:  Patient reported that her current stress level is at a 4 out of 10 (1=low, 5=moderate, 10=high). Patient stated that she is not really stressed but have minor things that she is dealing with that she is not allowing her to stress her to a certain point. Patient stated that she can maintain a low stress level by implementing her action steps consistently. Patient expressed that her major concern has been experiencing cramping after dialysis and feeling miserable, otherwise, she is okay.  Patient stated that she has not monitored her sodium well and shared that she has consumed bacon and sausage recently. Patient explained what the consequences of eating these foods had on her physically which included shortness of breath, swelling, fluid weight that was difficult to remove during dialysis, and cramping as a result in her hands, legs, and feet. Patient mentioned that tries to eat unsalted potato chips, use Mrs. DASH to season her food instead of cooking with salt, but finds it difficult not to want salty food.  Patient shared that she  eats fast food 2-3 times per week on days that she has dialysis because she does not have the energy to cook on those days. Discussed with patient the cost/benefit of consuming sodium after having dialysis and the consequences of consuming excessive amounts of sodium daily. Patient expressed due to her eating habits that she may end up hospitalized if she continues not be able to remove the excess fluid from her body, that she will continue to experience swelling and shortness of breath, along with the cramping during dialysis due to the high consumption of sodium.   Patient stated that it will take willpower for her to make this change because she is accustomed to eating a certain way but has been able to make other changes and is hopeful. Patient expressed that she is supposed to be eating more vegetables, and other foods to increase her potassium, while monitoring her phosphate level. Discussed with patient the importance of reading her food labels to identify different nutrients and ingredients in the foods that she consumes.   Indicators of Success and Accountability:  Patient reported that her stress level is a 4 out of 10.  Readiness: Patient is in the action stage of stress management and the preparation stage of reducing sodium intake.   Strengths and Supports: Patient is relying on being determined.   Challenges and Barriers: Patient stated that she does not have energy after dialysis to cook and rely on her daughter getting fast food  on those days.    Coaching Outcomes: Discussed with patient strategies to improve her eating habits while reducing consumption of fast food and sodium. Patient expressed that she would like to try prepping a meal and snack (fruit and/or vegetables) the night before having dialysis to help reduce consumption of fast food and sodium.   Patient will continue to read food labels to determine the amount of sodium per serving to choose low sodium to no salt added  products.   Patient will continue to implement her action steps towards reducing her stress level <= 5 out of 10 over the next two weeks.       Attempted: Fulfilled - Patient has reduced her stress to a 4 out of 10 by implementing her action steps to manage stress.  Not met - Patient has not monitored her sodium consumption per serving size or using sodium tracker sheets.     Referrals: Mailed patient information on the amount of sodium in table salt and sodium tracker sheets.

## 2022-11-18 ENCOUNTER — Telehealth: Payer: Self-pay | Admitting: Licensed Clinical Social Worker

## 2022-11-18 ENCOUNTER — Ambulatory Visit: Payer: Medicare HMO

## 2022-11-18 ENCOUNTER — Ambulatory Visit: Payer: Medicare HMO | Admitting: Nurse Practitioner

## 2022-11-18 NOTE — Telephone Encounter (Signed)
CSW contacted patient to inform that Health Coaching visit scheduled for today will need to be cancelled. Visit will be rescheduled next week. Message left. Lasandra Beech, LCSW, CCSW-MCS [phone]

## 2022-11-23 ENCOUNTER — Telehealth (HOSPITAL_BASED_OUTPATIENT_CLINIC_OR_DEPARTMENT_OTHER): Payer: Self-pay

## 2022-11-23 DIAGNOSIS — Z0000 Encounter for general adult medical examination without abnormal findings: Secondary | ICD-10-CM

## 2022-11-23 NOTE — Telephone Encounter (Signed)
Called patient to reschedule health coaching session due to being out of office on 11/15. Patient did not answer. Left patient a message to return call to reschedule.    Renaee Munda, MS, ERHD, [facility]  Care Guide, Health & Wellness Coach [address] [place] [address] Telephone: [phone] Email: [email or web address]

## 2022-11-29 ENCOUNTER — Telehealth: Payer: Self-pay

## 2022-11-29 DIAGNOSIS — Z0000 Encounter for general adult medical examination without abnormal findings: Secondary | ICD-10-CM

## 2022-11-29 NOTE — Telephone Encounter (Signed)
Returned patient's call to reschedule health coaching appointment due to being out of office. Patient requested to be rescheduled for next week. Patient has been scheduled for telephonic health coaching on 12/2 at 12:30pm. Patient will be called at that time.   Renaee Munda, MS, ERHD, [facility]  Care Guide, Health & Wellness Coach [address] [place] [address] Telephone: [phone] Email: [email or web address]

## 2022-12-05 ENCOUNTER — Ambulatory Visit: Payer: Medicare HMO | Attending: Cardiology

## 2022-12-05 DIAGNOSIS — Z0000 Encounter for general adult medical examination without abnormal findings: Secondary | ICD-10-CM

## 2022-12-05 NOTE — Progress Notes (Signed)
Appointment Outcome: Completed, Session #: 6                         Start time: 12:32pm   End time: 1:14pm   Total Mins: 42 minutes  AGREEMENTS SECTION   Overall Goal(s): Improve blood pressure by:  Reducing stress level to <= 5 out of 10 over the next month Reducing sodium intake to 1500mg  per day over the next month  Agreement/Action Steps: Reduce stress level  Being assertive Engage in self-reflection Establishing healthy boundaries Watch a funny show/movie on tv Listen to music Practice deep breathing Complete word search puzzles and/or use coloring book Make arts and crafts  Reduce sodium intake to 1500mg  per day Read food labels to monitor sodium intake per serving size Meal/snack prep the night prior to treatment days to reduce consumption of fast food and sodium      Progress Notes:  Patient shared multiple stressors that she has encountered over the past two weeks and explained how she had to manage those stressful situations by being assertive, utilizing support, and being proactive. Patient stated that she had to set healthy boundaries with someone to diminish the stress that has been created.   Patient mentioned that she has been focused on keeping herself together and finding solutions to issues she is dealing with currently. Patient explained that she reflects on why she needs to manage her stress to reduce the elevation of her bp, which affects her treatments. Patient shared that she still engages in watching funny tv shows/movies, engaging in arts and crafts by decorating for the upcoming holiday, and practice deep breathing.   Patient reported that her stress level over the past two weeks increased to a 10 out of 10 (1=low, 5=moderate, 10=high). Patient explained that by implementing the strategies, strengths, and utilizing support, it has helped her to reduce her stress level to a 5 out of 10.  Patient stated that she has been able to implement meal prepping the  night prior to her treatment days to help reduce fluid buildup and improve her treatments. Patient stated that she has worked on reducing her sodium intake by changing to no-salt potato chips and reducing her sugar intake. Patient stated that by meal prepping, she has been able to refrain from eating fast food as well. Patient expressed that she is looking forward to meeting with a nutritionist to learn more about an appropriate diet to improve her health conditions.    Indicators of Success and Accountability: Patient was able to implement the action steps to reduce her sodium intake and in turn having successful treatments.  Readiness: Patient is in the action stage of improving her blood pressure by reducing stress and reducing sodium intake.   Strengths and Supports: Patient is being supported by her family. Patient is relying on being assertive.   Challenges and Barriers: Patient experienced multiple stressors simultaneously that was challenging regarding lower her stress below a 5 out of 10 over the past two weeks.    Coaching Outcomes: Patient expressed that she is interested in additional support. Patient mentioned that she attempted to join a support group online but had technical difficulties. Patient stated that she is interested in an in-person meeting.   Discussed with the patient the previous resource mailed regarding local mental health resources. Patient requested a new copy be mailed to her so that he can review it again.     Attempted: Fulfilled - Patient practice deep breathing  when urge to smoke occurred.   Partial - Patient was limited in engaging in household chores/tasks as distractions due to pain.   Not met - Patient has not started using lozenges when nicotine craving is intense. Patient was not mindful of how many cigarettes smoking and when daily.    Resources: Mailed patient a copy of the local mental health resources in the area.

## 2023-01-05 DIAGNOSIS — N2581 Secondary hyperparathyroidism of renal origin: Secondary | ICD-10-CM | POA: Diagnosis not present

## 2023-01-05 DIAGNOSIS — D509 Iron deficiency anemia, unspecified: Secondary | ICD-10-CM | POA: Diagnosis not present

## 2023-01-05 DIAGNOSIS — N186 End stage renal disease: Secondary | ICD-10-CM | POA: Diagnosis not present

## 2023-01-05 DIAGNOSIS — D631 Anemia in chronic kidney disease: Secondary | ICD-10-CM | POA: Diagnosis not present

## 2023-01-06 ENCOUNTER — Ambulatory Visit: Payer: Medicare HMO | Admitting: Dietician

## 2023-01-07 DIAGNOSIS — D509 Iron deficiency anemia, unspecified: Secondary | ICD-10-CM | POA: Diagnosis not present

## 2023-01-07 DIAGNOSIS — N186 End stage renal disease: Secondary | ICD-10-CM | POA: Diagnosis not present

## 2023-01-07 DIAGNOSIS — D631 Anemia in chronic kidney disease: Secondary | ICD-10-CM | POA: Diagnosis not present

## 2023-01-07 DIAGNOSIS — N2581 Secondary hyperparathyroidism of renal origin: Secondary | ICD-10-CM | POA: Diagnosis not present

## 2023-01-09 ENCOUNTER — Ambulatory Visit: Payer: Medicare HMO

## 2023-01-09 DIAGNOSIS — Z0000 Encounter for general adult medical examination without abnormal findings: Secondary | ICD-10-CM

## 2023-01-09 NOTE — Progress Notes (Signed)
 Appointment Outcome: Completed, Session #: 54-month f/u Start time: 12:30pm   End time: 1:10pm   Total Mins: 40 minutes  AGREEMENTS SECTION   Overall Goal(s): Improve blood pressure by:  Reducing stress level to <= 5 out of 10 over the next month Reducing sodium intake to 1500mg  per day over the next month  Agreement/Action Steps: Reduce stress level  Being assertive Engage in self-reflection Establishing healthy boundaries Watch a funny show/movie on tv Listen to music Practice deep breathing Complete word search puzzles and/or use coloring book Make arts and crafts   Reduce sodium intake to 1500mg  per day Read food labels to monitor sodium intake per serving size Meal/snack prep the night prior to treatment days to reduce consumption of fast food and sodium    Progress Notes:  Patient reported that her current stress level is a 6 out of 10 (1=low, 5=moderate, 10=high). Patient shared that her major stress is financial. Patient shared that she could have rated her stress level at an 8 out of 10 due to the number of stressors that she has encountered in the past month. Patient stated that she has been working on not worrying, weighing out her options, and making the best decision for herself at the time to help manage her stress.   Patient has engaged with her support system to help her navigate a stressful situation. Patient mentioned that she was assertive in addressing some behaviors of others that was causing her stress, and came to acceptance of others' decisions via self-reflection, which helped her focus on improving her self-care. Patient expressed that she cannot let others stress her and has set healthy boundaries to avoid become overwhelmed and risking increasing her blood pressure and stress level.  Patient mentioned that she has had times where she woke up feeling anxious. Patient stated that she would sit on the edge of her bed and practice deep breathing for 5-10  minutes until she felt calm again. Patient shared that she made some adjustments in her night routine to help her reduce those moments she had begun experiencing.   Patient shared that she did get off track with eating as healthy as she normally would during the holidays and reported that it showed in her lab work. Patient stated that she is back on track with implement her eating habits by watching her sodium intake. Patient mentioned that during her recent visit to the grocery store, she read food labels to help her make some healthier food choices. Patient shared that she has continued to meal/snack prep as planned that has helped reduce her consumption of fast food.   Patient provided examples of meals that she has prepared recently and how she is mindful of what she is eating to help improve her experience during dialysis treatments. Patient stated that she is working on not going to sleep less than 1-2 hours after eating on dialysis days per instructions from her pulmonary doctor. Patient stated on other days she is able to refrain from eating after a certain time of time but has a challenge after eating on treatment days because she be drained. Patient stated that she has an upcoming appointment with the nutritionist in February to work on certain aspects of her nutrition she needs to improve on.   Patient stated that she is optimistic about her progress and being able to navigate her stressors moving forward. Patient stated that she is in a better frame of mind than she was previously but feels the need  for additional support from a therapist for additional concerns.   Indicators of Success and Accountability: Patient stated that she has come to a place of acceptance with some stressors and is in a better frame of mind than she was previously.  Readiness: Patient is in the action stage of lowering her blood pressure by reducing her stress and reducing sodium intake.   Strengths and Supports:  Patient is being supported by her family. Patient expressed that she is relying on her faith, prayer life, the ability to be assertive and optimistic as her strengths.  Challenges and Barriers: Patient stated that her main stressor is related to her finances.    Coaching Outcomes: Patient stated that she is going to focus on identifying a therapist to help with some additional concerns that are out of my scope of practice. Patient stated that if she is not successful in identifying a therapist that accepts her insurance via the local mental health resource list that she was mailed, she would talk with her provider for a referral.   Informed patient that she can also call the number on the back of her insurance card to find out who is in her network.   Patient will continue to implement her action steps as outlined above in addition to identifying a therapist over the next two months.    Attempted: Fulfilled - Patient continued to implement her action steps towards managing her stress to improve her blood pressure. Patient continues to read food labels and meal/snack prep as planned.   Partial - Patient rated her stress level at a 6 out of 10 with it varying at times, while the goal was to reduce stress to <= 5 out of 10.

## 2023-01-09 NOTE — Progress Notes (Deleted)
 Cardiology Office Note    Patient Name: Trudee Chirino Date of Encounter: 01/09/2023  Primary Care Provider:  Tammy Tari ONEIDA DEVONNA Primary Cardiologist:  Candyce Reek, MD Primary Electrophysiologist: None   Past Medical History    Past Medical History:  Diagnosis Date   Anxiety    ESRD (end stage renal disease) on dialysis (HCC)    Heart failure with mildly reduced ejection fraction (HFmrEF) (HCC)    Hypertension     History of Present Illness  Terralyn Matsumura is a 68 y.o. 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   Ms. Glendia was last seen on 08/19/2022 for complaints and was still recovering from PNA and  COVID. She was volume up on exam due to indiscretions with salt and volume currently managed by dialysis. She was advised to increase her metoprolol  to 25 mg every day. We also discussed re challenging the BiDil  in the future.  During today's visit the patient reports*** .  Patient denies chest pain, palpitations, dyspnea, PND, orthopnea, nausea, vomiting, dizziness, syncope, edema, weight gain, or early satiety.  ***Notes: -Last ischemic evaluation: -Last echo: -Interim ED visits: Review of Systems  Please see the history of present illness.    All other systems reviewed and are otherwise negative except as noted above.  Physical Exam    Wt Readings from Last 3 Encounters:  08/19/22 124 lb (56.2 kg)  08/08/22 120 lb (54.4 kg)  05/02/22 117 lb (53.1 kg)   CD:Uyzmz were no vitals filed for this visit.,There is no height or weight on file to calculate BMI. GEN: Well nourished, well developed in no acute distress Neck: No JVD; No carotid bruits Pulmonary: Clear to auscultation without rales, wheezing or rhonchi  Cardiovascular: Normal rate. Regular rhythm. Normal S1. Normal S2.   Murmurs: There is no murmur.  ABDOMEN: Soft, non-tender, non-distended EXTREMITIES:  No edema;  No deformity   EKG/LABS/ Recent Cardiac Studies   ECG personally reviewed by me today - ***  Risk Assessment/Calculations:   {Does this patient have ATRIAL FIBRILLATION?[phone]}      Lab Results  Component Value Date   WBC 10.6 (H) 05/02/2022   HGB 11.9 (L) 05/02/2022   HCT 35.0 (L) 05/02/2022   MCV 99.3 05/02/2022   PLT 144 (L) 05/02/2022   Lab Results  Component Value Date   CREATININE 10.40 (H) 05/02/2022   BUN 37 (H) 05/02/2022   NA 138 05/02/2022   K 3.7 05/02/2022   CL 104 05/02/2022   CO2 23 05/02/2022   Lab Results  Component Value Date   CHOL 204 (H) 02/27/2020   HDL 52 02/27/2020   LDLCALC 129 (H) 02/27/2020   TRIG 68 08/16/2020   CHOLHDL 3.9 02/27/2020    Lab Results  Component Value Date   HGBA1C 5.4 08/15/2020   Assessment & Plan    1. Combined chronic systolic and diastolic CHF: -2D echo was performed showing severely decreased LV function and EF of 25% with global hypokinesis with mild concentric LVH and grade 1 DD, with mildly reduced LV  -Today patient is  2.Essential hypertension: -Patient's blood pressure today was   3.Nonobstructive CAD: -R/LHC performed with 80% second diagonal lesion that was small caliber with minimal CAD  4.ESRD: -HD on HD TTS per nephrology      Disposition: Follow-up with Candyce Reek, MD or APP in *** months {Are you ordering a CV Procedure (e.g.  stress test, cath, DCCV, TEE, etc)?   Press F2        :[id number]}   Signed, Wyn Raddle, Jackee Shove, NP 01/09/2023, 9:59 AM [facility]

## 2023-01-10 DIAGNOSIS — N2581 Secondary hyperparathyroidism of renal origin: Secondary | ICD-10-CM | POA: Diagnosis not present

## 2023-01-10 DIAGNOSIS — D631 Anemia in chronic kidney disease: Secondary | ICD-10-CM | POA: Diagnosis not present

## 2023-01-10 DIAGNOSIS — D509 Iron deficiency anemia, unspecified: Secondary | ICD-10-CM | POA: Diagnosis not present

## 2023-01-10 DIAGNOSIS — N186 End stage renal disease: Secondary | ICD-10-CM | POA: Diagnosis not present

## 2023-01-11 ENCOUNTER — Ambulatory Visit: Payer: Medicare HMO | Admitting: Nurse Practitioner

## 2023-01-11 DIAGNOSIS — I428 Other cardiomyopathies: Secondary | ICD-10-CM

## 2023-01-11 DIAGNOSIS — I2510 Atherosclerotic heart disease of native coronary artery without angina pectoris: Secondary | ICD-10-CM

## 2023-01-11 DIAGNOSIS — I10 Essential (primary) hypertension: Secondary | ICD-10-CM

## 2023-01-11 DIAGNOSIS — I5022 Chronic systolic (congestive) heart failure: Secondary | ICD-10-CM

## 2023-01-11 DIAGNOSIS — N186 End stage renal disease: Secondary | ICD-10-CM

## 2023-01-12 DIAGNOSIS — D509 Iron deficiency anemia, unspecified: Secondary | ICD-10-CM | POA: Diagnosis not present

## 2023-01-12 DIAGNOSIS — D631 Anemia in chronic kidney disease: Secondary | ICD-10-CM | POA: Diagnosis not present

## 2023-01-12 DIAGNOSIS — N2581 Secondary hyperparathyroidism of renal origin: Secondary | ICD-10-CM | POA: Diagnosis not present

## 2023-01-12 DIAGNOSIS — N186 End stage renal disease: Secondary | ICD-10-CM | POA: Diagnosis not present

## 2023-01-14 DIAGNOSIS — D509 Iron deficiency anemia, unspecified: Secondary | ICD-10-CM | POA: Diagnosis not present

## 2023-01-14 DIAGNOSIS — N2581 Secondary hyperparathyroidism of renal origin: Secondary | ICD-10-CM | POA: Diagnosis not present

## 2023-01-14 DIAGNOSIS — D631 Anemia in chronic kidney disease: Secondary | ICD-10-CM | POA: Diagnosis not present

## 2023-01-14 DIAGNOSIS — N186 End stage renal disease: Secondary | ICD-10-CM | POA: Diagnosis not present

## 2023-01-17 DIAGNOSIS — D509 Iron deficiency anemia, unspecified: Secondary | ICD-10-CM | POA: Diagnosis not present

## 2023-01-17 DIAGNOSIS — N186 End stage renal disease: Secondary | ICD-10-CM | POA: Diagnosis not present

## 2023-01-17 DIAGNOSIS — N2581 Secondary hyperparathyroidism of renal origin: Secondary | ICD-10-CM | POA: Diagnosis not present

## 2023-01-17 DIAGNOSIS — D631 Anemia in chronic kidney disease: Secondary | ICD-10-CM | POA: Diagnosis not present

## 2023-01-19 DIAGNOSIS — N186 End stage renal disease: Secondary | ICD-10-CM | POA: Diagnosis not present

## 2023-01-19 DIAGNOSIS — D631 Anemia in chronic kidney disease: Secondary | ICD-10-CM | POA: Diagnosis not present

## 2023-01-19 DIAGNOSIS — N2581 Secondary hyperparathyroidism of renal origin: Secondary | ICD-10-CM | POA: Diagnosis not present

## 2023-01-19 DIAGNOSIS — D509 Iron deficiency anemia, unspecified: Secondary | ICD-10-CM | POA: Diagnosis not present

## 2023-01-21 DIAGNOSIS — D631 Anemia in chronic kidney disease: Secondary | ICD-10-CM | POA: Diagnosis not present

## 2023-01-21 DIAGNOSIS — N2581 Secondary hyperparathyroidism of renal origin: Secondary | ICD-10-CM | POA: Diagnosis not present

## 2023-01-21 DIAGNOSIS — D509 Iron deficiency anemia, unspecified: Secondary | ICD-10-CM | POA: Diagnosis not present

## 2023-01-21 DIAGNOSIS — N186 End stage renal disease: Secondary | ICD-10-CM | POA: Diagnosis not present

## 2023-01-24 DIAGNOSIS — D509 Iron deficiency anemia, unspecified: Secondary | ICD-10-CM | POA: Diagnosis not present

## 2023-01-24 DIAGNOSIS — N186 End stage renal disease: Secondary | ICD-10-CM | POA: Diagnosis not present

## 2023-01-24 DIAGNOSIS — N2581 Secondary hyperparathyroidism of renal origin: Secondary | ICD-10-CM | POA: Diagnosis not present

## 2023-01-24 DIAGNOSIS — D631 Anemia in chronic kidney disease: Secondary | ICD-10-CM | POA: Diagnosis not present

## 2023-01-26 DIAGNOSIS — D509 Iron deficiency anemia, unspecified: Secondary | ICD-10-CM | POA: Diagnosis not present

## 2023-01-26 DIAGNOSIS — N2581 Secondary hyperparathyroidism of renal origin: Secondary | ICD-10-CM | POA: Diagnosis not present

## 2023-01-26 DIAGNOSIS — D631 Anemia in chronic kidney disease: Secondary | ICD-10-CM | POA: Diagnosis not present

## 2023-01-26 DIAGNOSIS — N186 End stage renal disease: Secondary | ICD-10-CM | POA: Diagnosis not present

## 2023-01-28 DIAGNOSIS — N2581 Secondary hyperparathyroidism of renal origin: Secondary | ICD-10-CM | POA: Diagnosis not present

## 2023-01-28 DIAGNOSIS — D509 Iron deficiency anemia, unspecified: Secondary | ICD-10-CM | POA: Diagnosis not present

## 2023-01-28 DIAGNOSIS — D631 Anemia in chronic kidney disease: Secondary | ICD-10-CM | POA: Diagnosis not present

## 2023-01-28 DIAGNOSIS — N186 End stage renal disease: Secondary | ICD-10-CM | POA: Diagnosis not present

## 2023-01-31 DIAGNOSIS — D509 Iron deficiency anemia, unspecified: Secondary | ICD-10-CM | POA: Diagnosis not present

## 2023-01-31 DIAGNOSIS — D631 Anemia in chronic kidney disease: Secondary | ICD-10-CM | POA: Diagnosis not present

## 2023-01-31 DIAGNOSIS — N2581 Secondary hyperparathyroidism of renal origin: Secondary | ICD-10-CM | POA: Diagnosis not present

## 2023-01-31 DIAGNOSIS — N186 End stage renal disease: Secondary | ICD-10-CM | POA: Diagnosis not present

## 2023-02-01 ENCOUNTER — Ambulatory Visit: Payer: Medicare HMO | Admitting: Nurse Practitioner

## 2023-02-02 DIAGNOSIS — N2581 Secondary hyperparathyroidism of renal origin: Secondary | ICD-10-CM | POA: Diagnosis not present

## 2023-02-02 DIAGNOSIS — D631 Anemia in chronic kidney disease: Secondary | ICD-10-CM | POA: Diagnosis not present

## 2023-02-02 DIAGNOSIS — N186 End stage renal disease: Secondary | ICD-10-CM | POA: Diagnosis not present

## 2023-02-02 DIAGNOSIS — D509 Iron deficiency anemia, unspecified: Secondary | ICD-10-CM | POA: Diagnosis not present

## 2023-02-03 DIAGNOSIS — N186 End stage renal disease: Secondary | ICD-10-CM | POA: Diagnosis not present

## 2023-02-03 DIAGNOSIS — I120 Hypertensive chronic kidney disease with stage 5 chronic kidney disease or end stage renal disease: Secondary | ICD-10-CM | POA: Diagnosis not present

## 2023-02-03 DIAGNOSIS — F410 Panic disorder [episodic paroxysmal anxiety] without agoraphobia: Secondary | ICD-10-CM | POA: Diagnosis not present

## 2023-02-03 DIAGNOSIS — Z992 Dependence on renal dialysis: Secondary | ICD-10-CM | POA: Diagnosis not present

## 2023-02-03 DIAGNOSIS — H6123 Impacted cerumen, bilateral: Secondary | ICD-10-CM | POA: Diagnosis not present

## 2023-02-04 DIAGNOSIS — D631 Anemia in chronic kidney disease: Secondary | ICD-10-CM | POA: Diagnosis not present

## 2023-02-04 DIAGNOSIS — D509 Iron deficiency anemia, unspecified: Secondary | ICD-10-CM | POA: Diagnosis not present

## 2023-02-04 DIAGNOSIS — N186 End stage renal disease: Secondary | ICD-10-CM | POA: Diagnosis not present

## 2023-02-04 DIAGNOSIS — N2581 Secondary hyperparathyroidism of renal origin: Secondary | ICD-10-CM | POA: Diagnosis not present

## 2023-02-07 DIAGNOSIS — D631 Anemia in chronic kidney disease: Secondary | ICD-10-CM | POA: Diagnosis not present

## 2023-02-07 DIAGNOSIS — N186 End stage renal disease: Secondary | ICD-10-CM | POA: Diagnosis not present

## 2023-02-07 DIAGNOSIS — D509 Iron deficiency anemia, unspecified: Secondary | ICD-10-CM | POA: Diagnosis not present

## 2023-02-07 DIAGNOSIS — N2581 Secondary hyperparathyroidism of renal origin: Secondary | ICD-10-CM | POA: Diagnosis not present

## 2023-02-09 DIAGNOSIS — N186 End stage renal disease: Secondary | ICD-10-CM | POA: Diagnosis not present

## 2023-02-09 DIAGNOSIS — D631 Anemia in chronic kidney disease: Secondary | ICD-10-CM | POA: Diagnosis not present

## 2023-02-09 DIAGNOSIS — N2581 Secondary hyperparathyroidism of renal origin: Secondary | ICD-10-CM | POA: Diagnosis not present

## 2023-02-09 DIAGNOSIS — D509 Iron deficiency anemia, unspecified: Secondary | ICD-10-CM | POA: Diagnosis not present

## 2023-02-11 DIAGNOSIS — N186 End stage renal disease: Secondary | ICD-10-CM | POA: Diagnosis not present

## 2023-02-11 DIAGNOSIS — D509 Iron deficiency anemia, unspecified: Secondary | ICD-10-CM | POA: Diagnosis not present

## 2023-02-11 DIAGNOSIS — N2581 Secondary hyperparathyroidism of renal origin: Secondary | ICD-10-CM | POA: Diagnosis not present

## 2023-02-11 DIAGNOSIS — D631 Anemia in chronic kidney disease: Secondary | ICD-10-CM | POA: Diagnosis not present

## 2023-02-14 DIAGNOSIS — N2581 Secondary hyperparathyroidism of renal origin: Secondary | ICD-10-CM | POA: Diagnosis not present

## 2023-02-14 DIAGNOSIS — N186 End stage renal disease: Secondary | ICD-10-CM | POA: Diagnosis not present

## 2023-02-14 DIAGNOSIS — D631 Anemia in chronic kidney disease: Secondary | ICD-10-CM | POA: Diagnosis not present

## 2023-02-14 DIAGNOSIS — D509 Iron deficiency anemia, unspecified: Secondary | ICD-10-CM | POA: Diagnosis not present

## 2023-02-16 DIAGNOSIS — N186 End stage renal disease: Secondary | ICD-10-CM | POA: Diagnosis not present

## 2023-02-16 DIAGNOSIS — D631 Anemia in chronic kidney disease: Secondary | ICD-10-CM | POA: Diagnosis not present

## 2023-02-16 DIAGNOSIS — N2581 Secondary hyperparathyroidism of renal origin: Secondary | ICD-10-CM | POA: Diagnosis not present

## 2023-02-16 DIAGNOSIS — D509 Iron deficiency anemia, unspecified: Secondary | ICD-10-CM | POA: Diagnosis not present

## 2023-02-16 DIAGNOSIS — Z114 Encounter for screening for human immunodeficiency virus [HIV]: Secondary | ICD-10-CM | POA: Diagnosis not present

## 2023-02-16 DIAGNOSIS — Z1159 Encounter for screening for other viral diseases: Secondary | ICD-10-CM | POA: Diagnosis not present

## 2023-02-18 DIAGNOSIS — N2581 Secondary hyperparathyroidism of renal origin: Secondary | ICD-10-CM | POA: Diagnosis not present

## 2023-02-18 DIAGNOSIS — D631 Anemia in chronic kidney disease: Secondary | ICD-10-CM | POA: Diagnosis not present

## 2023-02-18 DIAGNOSIS — D509 Iron deficiency anemia, unspecified: Secondary | ICD-10-CM | POA: Diagnosis not present

## 2023-02-18 DIAGNOSIS — N186 End stage renal disease: Secondary | ICD-10-CM | POA: Diagnosis not present

## 2023-02-20 DIAGNOSIS — H40013 Open angle with borderline findings, low risk, bilateral: Secondary | ICD-10-CM | POA: Diagnosis not present

## 2023-02-21 DIAGNOSIS — N2581 Secondary hyperparathyroidism of renal origin: Secondary | ICD-10-CM | POA: Diagnosis not present

## 2023-02-21 DIAGNOSIS — D509 Iron deficiency anemia, unspecified: Secondary | ICD-10-CM | POA: Diagnosis not present

## 2023-02-21 DIAGNOSIS — D631 Anemia in chronic kidney disease: Secondary | ICD-10-CM | POA: Diagnosis not present

## 2023-02-21 DIAGNOSIS — N186 End stage renal disease: Secondary | ICD-10-CM | POA: Diagnosis not present

## 2023-02-23 DIAGNOSIS — N186 End stage renal disease: Secondary | ICD-10-CM | POA: Diagnosis not present

## 2023-02-23 DIAGNOSIS — N2581 Secondary hyperparathyroidism of renal origin: Secondary | ICD-10-CM | POA: Diagnosis not present

## 2023-02-23 DIAGNOSIS — D509 Iron deficiency anemia, unspecified: Secondary | ICD-10-CM | POA: Diagnosis not present

## 2023-02-23 DIAGNOSIS — D631 Anemia in chronic kidney disease: Secondary | ICD-10-CM | POA: Diagnosis not present

## 2023-02-25 DIAGNOSIS — D509 Iron deficiency anemia, unspecified: Secondary | ICD-10-CM | POA: Diagnosis not present

## 2023-02-25 DIAGNOSIS — D631 Anemia in chronic kidney disease: Secondary | ICD-10-CM | POA: Diagnosis not present

## 2023-02-25 DIAGNOSIS — N186 End stage renal disease: Secondary | ICD-10-CM | POA: Diagnosis not present

## 2023-02-25 DIAGNOSIS — N2581 Secondary hyperparathyroidism of renal origin: Secondary | ICD-10-CM | POA: Diagnosis not present

## 2023-02-28 DIAGNOSIS — D631 Anemia in chronic kidney disease: Secondary | ICD-10-CM | POA: Diagnosis not present

## 2023-02-28 DIAGNOSIS — D509 Iron deficiency anemia, unspecified: Secondary | ICD-10-CM | POA: Diagnosis not present

## 2023-02-28 DIAGNOSIS — N2581 Secondary hyperparathyroidism of renal origin: Secondary | ICD-10-CM | POA: Diagnosis not present

## 2023-02-28 DIAGNOSIS — N186 End stage renal disease: Secondary | ICD-10-CM | POA: Diagnosis not present

## 2023-03-02 DIAGNOSIS — N2581 Secondary hyperparathyroidism of renal origin: Secondary | ICD-10-CM | POA: Diagnosis not present

## 2023-03-02 DIAGNOSIS — D631 Anemia in chronic kidney disease: Secondary | ICD-10-CM | POA: Diagnosis not present

## 2023-03-02 DIAGNOSIS — D509 Iron deficiency anemia, unspecified: Secondary | ICD-10-CM | POA: Diagnosis not present

## 2023-03-02 DIAGNOSIS — N186 End stage renal disease: Secondary | ICD-10-CM | POA: Diagnosis not present

## 2023-03-03 ENCOUNTER — Encounter: Payer: Medicare Other | Attending: Nurse Practitioner | Admitting: Dietician

## 2023-03-03 ENCOUNTER — Encounter: Payer: Self-pay | Admitting: Dietician

## 2023-03-03 VITALS — Ht 63.0 in | Wt 123.0 lb

## 2023-03-03 DIAGNOSIS — Z992 Dependence on renal dialysis: Secondary | ICD-10-CM | POA: Diagnosis not present

## 2023-03-03 DIAGNOSIS — N186 End stage renal disease: Secondary | ICD-10-CM | POA: Insufficient documentation

## 2023-03-03 NOTE — Progress Notes (Signed)
 Medical Nutrition Therapy  Appointment Start time:  [phone]  Appointment End time:  1010  Primary concerns today: states that her phosphorous labs are variable.  States that her appetite varies a lot. Some foods give her Referral diagnosis: ESRD and nutritional deficiency Preferred learning style: no preference indicated Learning readiness: ready   NUTRITION ASSESSMENT  63" 123 lbs 03/03/2023 Wt Readings from Last 3 Encounters:  08/19/22 124 lb (56.2 kg)  08/08/22 120 lb (54.4 kg)  05/02/22 117 lb (53.1 kg)    Clinical Medical Hx: ESRD on HD (2022), CHF, home oxygen, HTN Medications: see list Labs:  noted Notable Signs/Symptoms: none Basic Nutrition Focused Physical exam completed.  Lack of muscle at clavicle region noted.   Patient skips meals due to dialysis, nutrition quality fair, inadequate protein due to skipping meals, aims to eat low sodium but due to energy level eats fast food at times.  Lifestyle & Dietary Hx Patient lives with her daughter.  She is trying to cook more at home.  They share shopping and cooking. She reads labels and limits her sodium intake. She states that she tries to follow her fluid restriction but states that she is more dehydrated on dialysis day.  Likes to eat ice. Lactose intolerant. She moved from [place] in 2015.  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  She is now retired. Uses a walker.  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, renal vit Sleep: good on dialysis days and fair on other days as she takes a nap Stress / self-care: fair Current average weekly physical activity: ADL's uses a walker  24-Hr Dietary Recall First Meal: skips or toast with butter on HD day OR egg whites, beef bacon, white toast, ketchup OR french toast, beef bacon Snack: none Second Meal: skips on dialysis days frequently OR ritz crackers, occasional cheese OR a LS [place] sandwich OR LS [place]  sandwich on white bread OR leftovers Snack: no salt potato chips OR swiss cake roll or duncan sticks Third Meal: taco bell last night OR Lasagne or spaghetti with salad and ranch or baked chicken wings, rice, green beans OR homemade chicken salad sandwich OR chicken salad with noodles Snack: grapes, apple, or little debbie, or no salt chips Beverages: water, VidaFuel (90 calories, 16 grams protein, 6 g carbohydrates in 2 oz shot), gingerale, cranberry juice, lemonade   Estimated Energy Needs Calories: 1700-1800 Protein: 65-75g  NUTRITION DIAGNOSIS  NB-1.1 Food and nutrition-related knowledge deficit As related to balance of carbohydrates, protein, and fat re: HD.  As evidenced by diet hx and patient report.   NUTRITION INTERVENTION  Nutrition education (E-1) on the following topics:  Resources for ESRD including WhyNotPoker.uy, Kidney.org, Calorie King app Simple meal planning and preparation for good nutrition quality Dining out with ESRD Supplementation Meal time schedule Snack options  Handouts Provided Include  Copies from the Choose a Meal book  Learning Style & Readiness for Change Teaching method utilized: Visual & Auditory  Demonstrated degree of understanding via: Teach Back  Barriers to learning/adherence to lifestyle change: energy, health  Goals Established by Pt Plan simple meals  Use the crock pot to have food ready when you get home from dialysis.  Plan ahead  Pack your lunch for dialysis   LS [place] sandwich on white, raw vegetables, fruit   Fruit, cheese, LS crackers, raw vegetables   Wrap with chicken or beans, lettuce, vege of choice,  fruit  Quality nutrition in your snacks and meals Avoid skipping meals Mindful eating out - ask for foods to be prepared without salt   MONITORING & EVALUATION Dietary intake, weekly physical activity, in 2 months.  Next Steps  Patient is to call for questions.

## 2023-03-03 NOTE — Patient Instructions (Addendum)
 Plan simple meals  Use the crock pot to have food ready when you get home from dialysis.  Plan ahead  Pack your lunch for dialysis   LS [place] sandwich on white, raw vegetables, fruit   Fruit, cheese, LS crackers, raw vegetables   Wrap with chicken or beans, lettuce, vege of choice, fruit  Quality nutrition in your snacks and meals Avoid skipping meals Mindful eating out - ask for foods to be prepared without salt  Resource: [employer] Kidney.org Calorie [employer]

## 2023-03-04 DIAGNOSIS — N186 End stage renal disease: Secondary | ICD-10-CM | POA: Diagnosis not present

## 2023-03-04 DIAGNOSIS — N2581 Secondary hyperparathyroidism of renal origin: Secondary | ICD-10-CM | POA: Diagnosis not present

## 2023-03-04 DIAGNOSIS — D509 Iron deficiency anemia, unspecified: Secondary | ICD-10-CM | POA: Diagnosis not present

## 2023-03-04 DIAGNOSIS — D631 Anemia in chronic kidney disease: Secondary | ICD-10-CM | POA: Diagnosis not present

## 2023-03-07 DIAGNOSIS — N2581 Secondary hyperparathyroidism of renal origin: Secondary | ICD-10-CM | POA: Diagnosis not present

## 2023-03-07 DIAGNOSIS — D631 Anemia in chronic kidney disease: Secondary | ICD-10-CM | POA: Diagnosis not present

## 2023-03-07 DIAGNOSIS — D509 Iron deficiency anemia, unspecified: Secondary | ICD-10-CM | POA: Diagnosis not present

## 2023-03-07 DIAGNOSIS — N186 End stage renal disease: Secondary | ICD-10-CM | POA: Diagnosis not present

## 2023-03-09 DIAGNOSIS — D509 Iron deficiency anemia, unspecified: Secondary | ICD-10-CM | POA: Diagnosis not present

## 2023-03-09 DIAGNOSIS — N186 End stage renal disease: Secondary | ICD-10-CM | POA: Diagnosis not present

## 2023-03-09 DIAGNOSIS — N2581 Secondary hyperparathyroidism of renal origin: Secondary | ICD-10-CM | POA: Diagnosis not present

## 2023-03-09 DIAGNOSIS — D631 Anemia in chronic kidney disease: Secondary | ICD-10-CM | POA: Diagnosis not present

## 2023-03-10 ENCOUNTER — Ambulatory Visit: Payer: Medicare Other | Attending: Internal Medicine

## 2023-03-10 DIAGNOSIS — Z0000 Encounter for general adult medical examination without abnormal findings: Secondary | ICD-10-CM

## 2023-03-10 NOTE — Progress Notes (Signed)
 Appointment Outcome: Completed, Session #: Final                         Start time: 12:31pm   End time: 1:01pm   Total Mins: 30 minutes  AGREEMENTS SECTION   Overall Goal(s): Improve blood pressure by:  Reducing stress level to <= 5 out of 10 over the next month Reducing sodium intake to 1500mg  per day over the next month  Agreement/Action Steps: Reduce stress level  Being assertive Engage in self-reflection Establishing healthy boundaries Watch a funny show/movie on tv Listen to music Practice deep breathing Complete word search puzzles and/or use coloring book Make arts and crafts   Reduce sodium intake to 1500mg  per day Read food labels to monitor sodium intake per serving size Meal/snack prep the night prior to treatment days to reduce consumption of fast food and sodium    Progress Notes:  Reduce sodium intake Patient shared how she has had a challenge with her dry weight when it comes to her eating and how it affects her treatments during dialysis. Patient stated that she has had [place] food prior to dialysis and it increased her dry weight, which caused cramping during treatment.   Patient stated as a result she later had trouble with walking and breathing. Patient was not able to perform many house chores like doing laundry and cooking within the same day.  Patient has started working with a Museum/gallery exhibitions officer for ESRD and nutritional deficiency to improve her eating habits.  Patient had a nutrition assessment, provided a 24-hour dietary recall, and assigned a nutrition intervention where resources (e.g., suggested apps, copies from the Choose a Meal book) were provided.  Patient established goals to plan simple meals, to continue planning lunch/snacks prior to dialysis days, practice mindful eating and avoid skipping meals.   Stress management Patient reported that her stress level was a 5 out of 10 (1=low, 5=moderate, 10=high). Patient stated that she has had a  day that caused her stress to be and 8 out of 10 because her health care and personal life were being challenged due to changes being implemented.   Patient shared to manage her stress in the moment, she was assertive, established boundaries, engaged in reflection and acceptance. Patient decompressed to reduce stress at home by watching funny tv shows, and selectively listening to music.   Patient stated that at times she has to get herself together and practices deep breathing. Patient tries not to engage in behaviors that will make her more stressed and anxious.  Patient has been socializing more for support, going to church, and getting out the house to reduce and manage stress. Patient is striving to manage stress better to avoid elevating her blood pressure more.    Coaching Outcomes: Patient recognized how her food choices and not managing her sodium intake affects her treatments at dialysis. Patient is currently working with a Museum/gallery exhibitions officer regarding her eating habits for ESRD and nutritional deficiency.   Patient has completed the health coaching program as of 03/10/2023.     Attempted: Fulfilled - Patient maintained her goal of having a stress level at <= 5 out of 10 utilizing her action steps.  Not met - Patient was not able to maintain the intake of 1500mg  of sodium per day based on food choices.

## 2023-03-11 DIAGNOSIS — D509 Iron deficiency anemia, unspecified: Secondary | ICD-10-CM | POA: Diagnosis not present

## 2023-03-11 DIAGNOSIS — N186 End stage renal disease: Secondary | ICD-10-CM | POA: Diagnosis not present

## 2023-03-11 DIAGNOSIS — N2581 Secondary hyperparathyroidism of renal origin: Secondary | ICD-10-CM | POA: Diagnosis not present

## 2023-03-11 DIAGNOSIS — D631 Anemia in chronic kidney disease: Secondary | ICD-10-CM | POA: Diagnosis not present

## 2023-03-14 DIAGNOSIS — N186 End stage renal disease: Secondary | ICD-10-CM | POA: Diagnosis not present

## 2023-03-14 DIAGNOSIS — D631 Anemia in chronic kidney disease: Secondary | ICD-10-CM | POA: Diagnosis not present

## 2023-03-14 DIAGNOSIS — N2581 Secondary hyperparathyroidism of renal origin: Secondary | ICD-10-CM | POA: Diagnosis not present

## 2023-03-14 DIAGNOSIS — D509 Iron deficiency anemia, unspecified: Secondary | ICD-10-CM | POA: Diagnosis not present

## 2023-03-16 DIAGNOSIS — J9611 Chronic respiratory failure with hypoxia: Secondary | ICD-10-CM | POA: Diagnosis not present

## 2023-03-16 DIAGNOSIS — N2581 Secondary hyperparathyroidism of renal origin: Secondary | ICD-10-CM | POA: Diagnosis not present

## 2023-03-16 DIAGNOSIS — D631 Anemia in chronic kidney disease: Secondary | ICD-10-CM | POA: Diagnosis not present

## 2023-03-16 DIAGNOSIS — N186 End stage renal disease: Secondary | ICD-10-CM | POA: Diagnosis not present

## 2023-03-16 DIAGNOSIS — D509 Iron deficiency anemia, unspecified: Secondary | ICD-10-CM | POA: Diagnosis not present

## 2023-03-18 DIAGNOSIS — D631 Anemia in chronic kidney disease: Secondary | ICD-10-CM | POA: Diagnosis not present

## 2023-03-18 DIAGNOSIS — N2581 Secondary hyperparathyroidism of renal origin: Secondary | ICD-10-CM | POA: Diagnosis not present

## 2023-03-18 DIAGNOSIS — D509 Iron deficiency anemia, unspecified: Secondary | ICD-10-CM | POA: Diagnosis not present

## 2023-03-18 DIAGNOSIS — N186 End stage renal disease: Secondary | ICD-10-CM | POA: Diagnosis not present

## 2023-03-19 NOTE — Progress Notes (Addendum)
 Cardiology Office Note    Patient Name: [name] Date of Encounter: 03/20/2023  Primary Care Provider:  Virgilio Belling, PA-C Primary Cardiologist:  None Primary Electrophysiologist: None   Past Medical History    Past Medical History:  Diagnosis Date   Anxiety    ESRD (end stage renal disease) on dialysis (HCC)    Heart failure with mildly reduced ejection fraction (HFmrEF) (HCC)    Hypertension     History of Present Illness  Barbara Crawford is a 68 y.o. female with PMH of ESRD on ESRD, HFmrEF, HTN and anxiety who presents today for 21-month follow-up.  [name] was last seen on 08/19/2022 for follow-up.  During her visit she was recovering from severe bout of pneumonia and COVID.  She was noted to be slightly volume up on examination.  She had rechallenge of BiDil and hydralazine to improve GDMT and was also referred to health and wellness for health coaching.  She was found to have stable blood pressure and volume continue to be managed by dialysis.  [name] presents today for 42-month follow-up.  She reports experiencing increased fatigue and shortness of breath with activities. The patient reports having to pace her activities, stating she cannot do too much in one day. The patient denies any chest pain. The patient also reports emotional stress related to dealing with her chronic illnesses and is actively seeking support groups. The patient's blood pressure was found to be elevated during the visit and was 150/70 and was 154/68 on recheck.  She reports compliance with her current medication and denies any adverse reactions.  She is euvolemic on examination and has no shortness of breath or fatigue during today's visit.  She denies chest pain, palpitations, dyspnea, PND, orthopnea, nausea, vomiting, dizziness, syncope, edema, weight gain, or early satiety.   Review of Systems  Please see the history of present illness.    All other systems reviewed and are  otherwise negative except as noted above.  Physical Exam    Wt Readings from Last 3 Encounters:  03/20/23 125 lb (56.7 kg)  03/03/23 123 lb (55.8 kg)  08/19/22 124 lb (56.2 kg)   VS: Vitals:   03/20/23 1122 03/20/23 1214  BP: (!) 150/70 (!) 154/68  Pulse: 82   SpO2: 92%   ,Body mass index is 22.14 kg/m. GEN: Well nourished, well developed in no acute distress Neck: No JVD; No carotid bruits Pulmonary: Clear to auscultation without rales, wheezing or rhonchi  Cardiovascular: Normal rate. Regular rhythm. Normal S1. Normal S2.   Murmurs: There is no murmur.  ABDOMEN: Soft, non-tender, non-distended EXTREMITIES:  No edema; No deformity   EKG/LABS/ Recent Cardiac Studies   ECG personally reviewed by me today -none completed today  Risk Assessment/Calculations:          Lab Results  Component Value Date   WBC 10.6 (H) 05/02/2022   HGB 11.9 (L) 05/02/2022   HCT 35.0 (L) 05/02/2022   MCV 99.3 05/02/2022   PLT 144 (L) 05/02/2022   Lab Results  Component Value Date   CREATININE 10.40 (H) 05/02/2022   BUN 37 (H) 05/02/2022   NA 138 05/02/2022   K 3.7 05/02/2022   CL 104 05/02/2022   CO2 23 05/02/2022   Lab Results  Component Value Date   CHOL 204 (H) 02/27/2020   HDL 52 02/27/2020   LDLCALC 129 (H) 02/27/2020   TRIG 68 08/16/2020   CHOLHDL 3.9 02/27/2020    Lab  Results  Component Value Date   HGBA1C 5.4 08/15/2020   Assessment & Plan    1.Combined chronic systolic and diastolic CHF: -2D echo was performed showing severely decreased LV function and EF of 25% with global hypokinesis with mild concentric LVH and grade 1 DD, with mildly reduced LV  -Today patient is on exam with NYHA class II symptoms. She reports episodes of dyspnea and fatigue suggest worsening CHF.   Patient was intolerant to Bidil/hydralazine due to headaches. Considering carvedilol for improved outcomes. - Order echocardiogram to assess current heart function. - Monitor blood pressure at  home for one week, twice daily. - Consider switching from metoprolol to carvedilol if blood pressure is not controlled. -Volume is currently managed by dialysis.  2.  Essential HTN: -HYPERTENSION CONTROL Vitals:   03/20/23 1122 03/20/23 1214  BP: (!) 150/70 (!) 154/68    The patient's blood pressure is elevated above target today.  In order to address the patient's elevated BP: Blood pressure will be monitored at home to determine if medication changes need to be made.    - Patient will monitor blood pressures over the next week and if remaining elevated may discontinue Toprol-XL and add carvedilol 3.25 mg twice daily  3.  Nonobstructive CAD: -R/LHC performed with 80% second diagonal lesion that was small caliber with minimal CAD -Continue ASA 81 mg and Toprol 25 mg daily -Patient reports no chest pain or angina since previous follow-up.  4.  ESRD: -HD on HD TTS per nephrology -Patient has new dry weight and volume is managed per HD session   - Continue dietary management with dietician. - Monitor phosphorus and potassium intake. - Encourage packing nutritious meals for dialysis days.  Disposition: Follow-up with None or APP in 3 months    Signed, Napoleon Form, Leodis Rains, NP 03/20/2023, 1:14 PM [facility]

## 2023-03-20 ENCOUNTER — Ambulatory Visit: Payer: Self-pay | Attending: Nurse Practitioner | Admitting: Nurse Practitioner

## 2023-03-20 ENCOUNTER — Encounter: Payer: Self-pay | Admitting: Nurse Practitioner

## 2023-03-20 VITALS — BP 154/68 | HR 82 | Ht 63.0 in | Wt 125.0 lb

## 2023-03-20 DIAGNOSIS — I10 Essential (primary) hypertension: Secondary | ICD-10-CM

## 2023-03-20 DIAGNOSIS — I5022 Chronic systolic (congestive) heart failure: Secondary | ICD-10-CM | POA: Diagnosis not present

## 2023-03-20 DIAGNOSIS — N186 End stage renal disease: Secondary | ICD-10-CM

## 2023-03-20 DIAGNOSIS — I2510 Atherosclerotic heart disease of native coronary artery without angina pectoris: Secondary | ICD-10-CM

## 2023-03-20 DIAGNOSIS — I428 Other cardiomyopathies: Secondary | ICD-10-CM

## 2023-03-20 NOTE — Patient Instructions (Addendum)
 Medication Instructions:  Your physician recommends that you continue on your current medications as directed. Please refer to the Current Medication list given to you today. *If you need a refill on your cardiac medications before your next appointment, please call your pharmacy*   Lab Work: None Ordered If you have labs (blood work) drawn today and your tests are completely normal, you will receive your results only by: MyChart Message (if you have MyChart) OR A paper copy in the mail If you have any lab test that is abnormal or we need to change your treatment, we will call you to review the results.   Testing/Procedures: Your physician has requested that you have an echocardiogram. Echocardiography is a painless test that uses sound waves to create images of your heart. It provides your doctor with information about the size and shape of your heart and how well your heart's chambers and valves are working. This procedure takes approximately one hour. There are no restrictions for this procedure. Please do NOT wear cologne, perfume, aftershave, or lotions (deodorant is allowed). Please arrive 15 minutes prior to your appointment time.  Please note: We ask at that you not bring children with you during ultrasound (echo/ vascular) testing. Due to room size and safety concerns, children are not allowed in the ultrasound rooms during exams. Our front office staff cannot provide observation of children in our lobby area while testing is being conducted. An adult accompanying a patient to their appointment will only be allowed in the ultrasound room at the discretion of the ultrasound technician under special circumstances. We apologize for any inconvenience.   Follow-Up: At [facility], you and your health needs are our priority.  As part of our continuing mission to provide you with exceptional heart care, we have created designated Provider Care Teams.  These Care Teams include your  primary Cardiologist (physician) and Advanced Practice Providers (APPs -  Physician Assistants and Nurse Practitioners) who all work together to provide you with the care you need, when you need it.  We recommend signing up for the patient portal called "MyChart".  Sign up information is provided on this After Visit Summary.  MyChart is used to connect with patients for Virtual Visits (Telemedicine).  Patients are able to view lab/test results, encounter notes, upcoming appointments, etc.  Non-urgent messages can be sent to your provider as well.   To learn more about what you can do with MyChart, go to [email or web address].    Your next appointment:   3 month(s)  Provider:   Robin Searing, NP     Then, Donato Schultz, MD will plan to see you again in 6 month(s).    Other Instructions Check your blood pressure daily for 1 week, then contact the office with your readings.  Contact the office either by phone or MyChart with your readings.  Make sure to check your blood pressure 2 hours after taking your medications.   AVOID these things for 30 minutes before checking your blood pressure: No Drinking caffeine. No Drinking alcohol. No Eating. No Smoking. No Exercising.  Five minutes before checking your blood pressure: Pee. Sit in a dining chair. Avoid sitting in a soft couch or armchair. Be quiet. Do not talk.

## 2023-03-21 DIAGNOSIS — D509 Iron deficiency anemia, unspecified: Secondary | ICD-10-CM | POA: Diagnosis not present

## 2023-03-21 DIAGNOSIS — N2581 Secondary hyperparathyroidism of renal origin: Secondary | ICD-10-CM | POA: Diagnosis not present

## 2023-03-21 DIAGNOSIS — D631 Anemia in chronic kidney disease: Secondary | ICD-10-CM | POA: Diagnosis not present

## 2023-03-21 DIAGNOSIS — N186 End stage renal disease: Secondary | ICD-10-CM | POA: Diagnosis not present

## 2023-03-23 DIAGNOSIS — N2581 Secondary hyperparathyroidism of renal origin: Secondary | ICD-10-CM | POA: Diagnosis not present

## 2023-03-23 DIAGNOSIS — N186 End stage renal disease: Secondary | ICD-10-CM | POA: Diagnosis not present

## 2023-03-23 DIAGNOSIS — D509 Iron deficiency anemia, unspecified: Secondary | ICD-10-CM | POA: Diagnosis not present

## 2023-03-23 DIAGNOSIS — D631 Anemia in chronic kidney disease: Secondary | ICD-10-CM | POA: Diagnosis not present

## 2023-03-25 DIAGNOSIS — D631 Anemia in chronic kidney disease: Secondary | ICD-10-CM | POA: Diagnosis not present

## 2023-03-25 DIAGNOSIS — D509 Iron deficiency anemia, unspecified: Secondary | ICD-10-CM | POA: Diagnosis not present

## 2023-03-25 DIAGNOSIS — N186 End stage renal disease: Secondary | ICD-10-CM | POA: Diagnosis not present

## 2023-03-25 DIAGNOSIS — N2581 Secondary hyperparathyroidism of renal origin: Secondary | ICD-10-CM | POA: Diagnosis not present

## 2023-03-28 DIAGNOSIS — D631 Anemia in chronic kidney disease: Secondary | ICD-10-CM | POA: Diagnosis not present

## 2023-03-28 DIAGNOSIS — N186 End stage renal disease: Secondary | ICD-10-CM | POA: Diagnosis not present

## 2023-03-28 DIAGNOSIS — N2581 Secondary hyperparathyroidism of renal origin: Secondary | ICD-10-CM | POA: Diagnosis not present

## 2023-03-28 DIAGNOSIS — D509 Iron deficiency anemia, unspecified: Secondary | ICD-10-CM | POA: Diagnosis not present

## 2023-03-30 DIAGNOSIS — N186 End stage renal disease: Secondary | ICD-10-CM | POA: Diagnosis not present

## 2023-03-30 DIAGNOSIS — D509 Iron deficiency anemia, unspecified: Secondary | ICD-10-CM | POA: Diagnosis not present

## 2023-03-30 DIAGNOSIS — D631 Anemia in chronic kidney disease: Secondary | ICD-10-CM | POA: Diagnosis not present

## 2023-03-30 DIAGNOSIS — N2581 Secondary hyperparathyroidism of renal origin: Secondary | ICD-10-CM | POA: Diagnosis not present

## 2023-04-01 DIAGNOSIS — D509 Iron deficiency anemia, unspecified: Secondary | ICD-10-CM | POA: Diagnosis not present

## 2023-04-01 DIAGNOSIS — N2581 Secondary hyperparathyroidism of renal origin: Secondary | ICD-10-CM | POA: Diagnosis not present

## 2023-04-01 DIAGNOSIS — D631 Anemia in chronic kidney disease: Secondary | ICD-10-CM | POA: Diagnosis not present

## 2023-04-01 DIAGNOSIS — N186 End stage renal disease: Secondary | ICD-10-CM | POA: Diagnosis not present

## 2023-04-03 DIAGNOSIS — Z992 Dependence on renal dialysis: Secondary | ICD-10-CM | POA: Diagnosis not present

## 2023-04-03 DIAGNOSIS — N186 End stage renal disease: Secondary | ICD-10-CM | POA: Diagnosis not present

## 2023-04-04 ENCOUNTER — Other Ambulatory Visit: Payer: Self-pay

## 2023-04-04 DIAGNOSIS — D631 Anemia in chronic kidney disease: Secondary | ICD-10-CM | POA: Diagnosis not present

## 2023-04-04 DIAGNOSIS — N186 End stage renal disease: Secondary | ICD-10-CM | POA: Diagnosis not present

## 2023-04-04 DIAGNOSIS — D509 Iron deficiency anemia, unspecified: Secondary | ICD-10-CM | POA: Diagnosis not present

## 2023-04-04 DIAGNOSIS — N2581 Secondary hyperparathyroidism of renal origin: Secondary | ICD-10-CM | POA: Diagnosis not present

## 2023-04-04 MED ORDER — CARVEDILOL 3.125 MG PO TABS: 3.1250 mg | ORAL_TABLET | Freq: Two times a day (BID) | ORAL | 3 refills | Status: DC

## 2023-04-06 DIAGNOSIS — N2581 Secondary hyperparathyroidism of renal origin: Secondary | ICD-10-CM | POA: Diagnosis not present

## 2023-04-06 DIAGNOSIS — D509 Iron deficiency anemia, unspecified: Secondary | ICD-10-CM | POA: Diagnosis not present

## 2023-04-06 DIAGNOSIS — D631 Anemia in chronic kidney disease: Secondary | ICD-10-CM | POA: Diagnosis not present

## 2023-04-06 DIAGNOSIS — N186 End stage renal disease: Secondary | ICD-10-CM | POA: Diagnosis not present

## 2023-04-08 DIAGNOSIS — N2581 Secondary hyperparathyroidism of renal origin: Secondary | ICD-10-CM | POA: Diagnosis not present

## 2023-04-08 DIAGNOSIS — N186 End stage renal disease: Secondary | ICD-10-CM | POA: Diagnosis not present

## 2023-04-08 DIAGNOSIS — D631 Anemia in chronic kidney disease: Secondary | ICD-10-CM | POA: Diagnosis not present

## 2023-04-08 DIAGNOSIS — D509 Iron deficiency anemia, unspecified: Secondary | ICD-10-CM | POA: Diagnosis not present

## 2023-04-11 ENCOUNTER — Other Ambulatory Visit: Payer: Self-pay

## 2023-04-11 DIAGNOSIS — D631 Anemia in chronic kidney disease: Secondary | ICD-10-CM | POA: Diagnosis not present

## 2023-04-11 DIAGNOSIS — D509 Iron deficiency anemia, unspecified: Secondary | ICD-10-CM | POA: Diagnosis not present

## 2023-04-11 DIAGNOSIS — N186 End stage renal disease: Secondary | ICD-10-CM | POA: Diagnosis not present

## 2023-04-11 DIAGNOSIS — N2581 Secondary hyperparathyroidism of renal origin: Secondary | ICD-10-CM | POA: Diagnosis not present

## 2023-04-11 MED ORDER — METOPROLOL SUCCINATE ER 25 MG PO TB24: 25.0000 mg | ORAL_TABLET | Freq: Every day | ORAL | 3 refills | Status: DC

## 2023-04-13 DIAGNOSIS — N2581 Secondary hyperparathyroidism of renal origin: Secondary | ICD-10-CM | POA: Diagnosis not present

## 2023-04-13 DIAGNOSIS — D631 Anemia in chronic kidney disease: Secondary | ICD-10-CM | POA: Diagnosis not present

## 2023-04-13 DIAGNOSIS — D509 Iron deficiency anemia, unspecified: Secondary | ICD-10-CM | POA: Diagnosis not present

## 2023-04-13 DIAGNOSIS — N186 End stage renal disease: Secondary | ICD-10-CM | POA: Diagnosis not present

## 2023-04-14 ENCOUNTER — Ambulatory Visit (HOSPITAL_COMMUNITY): Attending: Internal Medicine

## 2023-04-14 DIAGNOSIS — I2510 Atherosclerotic heart disease of native coronary artery without angina pectoris: Secondary | ICD-10-CM | POA: Insufficient documentation

## 2023-04-14 DIAGNOSIS — I428 Other cardiomyopathies: Secondary | ICD-10-CM | POA: Diagnosis not present

## 2023-04-14 DIAGNOSIS — I10 Essential (primary) hypertension: Secondary | ICD-10-CM | POA: Diagnosis not present

## 2023-04-14 DIAGNOSIS — I5022 Chronic systolic (congestive) heart failure: Secondary | ICD-10-CM | POA: Insufficient documentation

## 2023-04-14 DIAGNOSIS — N186 End stage renal disease: Secondary | ICD-10-CM | POA: Insufficient documentation

## 2023-04-14 LAB — ECHOCARDIOGRAM COMPLETE
Area-P 1/2: 4.1 cm2
S' Lateral: 4.16 cm

## 2023-04-14 MED ORDER — PERFLUTREN LIPID MICROSPHERE
1.0000 mL | INTRAVENOUS | Status: AC | PRN
  Administered 2023-04-14: 1 mL via INTRAVENOUS

## 2023-04-15 DIAGNOSIS — D509 Iron deficiency anemia, unspecified: Secondary | ICD-10-CM | POA: Diagnosis not present

## 2023-04-15 DIAGNOSIS — D631 Anemia in chronic kidney disease: Secondary | ICD-10-CM | POA: Diagnosis not present

## 2023-04-15 DIAGNOSIS — N186 End stage renal disease: Secondary | ICD-10-CM | POA: Diagnosis not present

## 2023-04-15 DIAGNOSIS — N2581 Secondary hyperparathyroidism of renal origin: Secondary | ICD-10-CM | POA: Diagnosis not present

## 2023-04-16 DIAGNOSIS — J9611 Chronic respiratory failure with hypoxia: Secondary | ICD-10-CM | POA: Diagnosis not present

## 2023-04-18 DIAGNOSIS — N2581 Secondary hyperparathyroidism of renal origin: Secondary | ICD-10-CM | POA: Diagnosis not present

## 2023-04-18 DIAGNOSIS — N186 End stage renal disease: Secondary | ICD-10-CM | POA: Diagnosis not present

## 2023-04-18 DIAGNOSIS — D509 Iron deficiency anemia, unspecified: Secondary | ICD-10-CM | POA: Diagnosis not present

## 2023-04-18 DIAGNOSIS — D631 Anemia in chronic kidney disease: Secondary | ICD-10-CM | POA: Diagnosis not present

## 2023-04-20 DIAGNOSIS — D509 Iron deficiency anemia, unspecified: Secondary | ICD-10-CM | POA: Diagnosis not present

## 2023-04-20 DIAGNOSIS — D631 Anemia in chronic kidney disease: Secondary | ICD-10-CM | POA: Diagnosis not present

## 2023-04-20 DIAGNOSIS — N2581 Secondary hyperparathyroidism of renal origin: Secondary | ICD-10-CM | POA: Diagnosis not present

## 2023-04-20 DIAGNOSIS — N186 End stage renal disease: Secondary | ICD-10-CM | POA: Diagnosis not present

## 2023-04-21 ENCOUNTER — Ambulatory Visit: Payer: Medicare Other | Admitting: Dietician

## 2023-04-22 DIAGNOSIS — N2581 Secondary hyperparathyroidism of renal origin: Secondary | ICD-10-CM | POA: Diagnosis not present

## 2023-04-22 DIAGNOSIS — D631 Anemia in chronic kidney disease: Secondary | ICD-10-CM | POA: Diagnosis not present

## 2023-04-22 DIAGNOSIS — D509 Iron deficiency anemia, unspecified: Secondary | ICD-10-CM | POA: Diagnosis not present

## 2023-04-22 DIAGNOSIS — N186 End stage renal disease: Secondary | ICD-10-CM | POA: Diagnosis not present

## 2023-04-24 ENCOUNTER — Other Ambulatory Visit: Payer: Self-pay

## 2023-04-24 DIAGNOSIS — I5022 Chronic systolic (congestive) heart failure: Secondary | ICD-10-CM

## 2023-04-24 DIAGNOSIS — I428 Other cardiomyopathies: Secondary | ICD-10-CM

## 2023-04-25 DIAGNOSIS — D631 Anemia in chronic kidney disease: Secondary | ICD-10-CM | POA: Diagnosis not present

## 2023-04-25 DIAGNOSIS — N2581 Secondary hyperparathyroidism of renal origin: Secondary | ICD-10-CM | POA: Diagnosis not present

## 2023-04-25 DIAGNOSIS — D509 Iron deficiency anemia, unspecified: Secondary | ICD-10-CM | POA: Diagnosis not present

## 2023-04-25 DIAGNOSIS — N186 End stage renal disease: Secondary | ICD-10-CM | POA: Diagnosis not present

## 2023-04-27 DIAGNOSIS — D631 Anemia in chronic kidney disease: Secondary | ICD-10-CM | POA: Diagnosis not present

## 2023-04-27 DIAGNOSIS — N2581 Secondary hyperparathyroidism of renal origin: Secondary | ICD-10-CM | POA: Diagnosis not present

## 2023-04-27 DIAGNOSIS — N186 End stage renal disease: Secondary | ICD-10-CM | POA: Diagnosis not present

## 2023-04-27 DIAGNOSIS — D509 Iron deficiency anemia, unspecified: Secondary | ICD-10-CM | POA: Diagnosis not present

## 2023-04-27 NOTE — H&P (View-Only) (Signed)
 Cardiology Office Note:   Date:  05/01/2023  ID:  Barbara Crawford, DOB 1955-12-20, MRN [id number] PCP:  Hermenia Loose  [facility] Providers Cardiologist:  Alyssa Backbone, MD Referring MD: Hermenia Loose  Chief Complaint/Reason for Referral: Severe TR ASSESSMENT:    1. Nonrheumatic tricuspid valve regurgitation   2. NICM (nonischemic cardiomyopathy) (HCC)   3. Nonobstructive atherosclerosis of coronary artery   4. Hyperlipidemia LDL goal <70   5. Aortic atherosclerosis (HCC)   6. Primary hypertension   7. ESRD (end stage renal disease) (HCC)   8. Pre-procedure lab exam   9. Heart failure with mildly reduced ejection fraction (HFmrEF) (HCC)     PLAN:   In order of problems listed above: Tricuspid regurgitation: The patient's TR looks somewhat worse on her most recent echocardiogram.  She did have substantial TR on her last echocardiogram.  Will refer the patient for right heart catheterization to evaluate further.  She is relatively asymptomatic without signs or symptoms of right-sided heart failure.  I do not think any intervention for her tricuspid valve is necessary at this point in time.  I will refer the patient for advanced heart failure opinion and medical optimization.  Certainly if she becomes more dyspneic with right-sided symptoms of heart failure then I think tricuspid intervention could be pursued. Nonischemic cardiomyopathy: Intolerant of BiDil .  On Toprol  25 mg daily.  Not a candidate for SGLT2 inhibitor given end-stage renal disease.  She is very compliant with dialysis.  She has been on dialysis for 3 years and has not missed the session.  I think she would be a good candidate for spironolactone, an ARB, or Entresto due to compliance with dialysis.  Will start losartan  25 mg..  Will refer to advanced heart failure for further recommendations.  My sense is if she can get on adequate doses of goal-directed medical therapy her TR will  improve. Nonobstructive coronary artery disease: Continue aspirin  81 mg.  Will refrain from statin given end-stage renal disease. Hyperlipidemia: Defer statin for end-stage renal disease. Hypertension: Continue Toprol  25 mg daily.  I did discuss with the patient the addition of more afterload reducing agents such as an ARB or Entresto.  Patient agrees; will start losartan  25 mg.  Will refer to advanced heart failure. End-stage renal disease: Dialysis per nephrology.  Extremely compliant with dialysis; start losartan  25 mg.           Dispo:  Return in about 6 months (around 10/31/2023).      Medication Adjustments/Labs and Tests Ordered: Current medicines are reviewed at length with the patient today.  Concerns regarding medicines are outlined above.  The following changes have been made:     Labs/tests ordered: Orders Placed This Encounter  Procedures   CBC   Basic metabolic panel with GFR   AMB referral to CHF clinic   EKG 12-Lead    Medication Changes: Meds ordered this encounter  Medications   losartan  (COZAAR ) 25 MG tablet    Sig: Take 1 tablet (25 mg total) by mouth daily.    Dispense:  90 tablet    Refill:  3    Current medicines are reviewed at length with the patient today.  The patient does not have concerns regarding medicines.  I spent 50 minutes reviewing all clinical data during and prior to this visit including all relevant imaging studies, laboratories, clinical information from other health systems and prior notes from both Cardiology and other specialties, interviewing the  patient, conducting a complete physical examination, and coordinating care in order to formulate a comprehensive and personalized evaluation and treatment plan.   History of Present Illness:      FOCUSED PROBLEM LIST:   Nonischemic cardiomyopathy EF 50 to 55%, TTE March 2022  EF 45%, G2DD TTE August 2022  EF 25%, mild LVH, G1 DD TTE October 2023 EF 30 to 35%, mild LVH, G2 DD TTE  April 2025 Intolerant of BiDil  TR Severe, malcoaptation April 2025 TTE  Moderate (looks more like moderate to severe) October 2023 TTE Mean RA 8, V waves 12; PA 41/18 PVR 1.4 cath 2023 TRISCORE 2/12 (low risk) CAD Mild to moderate; 20% prox/mid LAD, 80% D2, 10% RCA cath 2023 End-stage renal disease Hypertension Hyperlipidemia Aortic atherosclerosis Chest CT 2023 Left anterior fascicular block Chronic respiratory failure Recurrent PNA >> supplemental oxygen Followed by pulmonology ([facility])  April 2025:  Patient consents to use of AI scribe. The patient is 68 year old female with the above listed medical problems referred for conditions regarding severe TR.  The patient was first evaluated by cardiology in October 2023.  She had developed sudden shortness of breath at that time.  Her troponins were elevated.  Her echocardiogram demonstrated ejection fraction of 25%.  She underwent cardiac catheterization and right heart catheterization with findings as detailed above.  She was started on medical therapy.  She was seen in March of this year by general cardiology and was doing well.  She denied any symptoms of shortness of breath, orthopnea, or paroxysmal nocturnal dyspnea.  Her blood pressure is not well-controlled.  An echocardiogram was performed which demonstrated an ejection fraction of approximately 30 to 35% with severe TR.  She is referred for further recommendations.  She has a history of heart failure with a reduced ejection fraction of approximately 30%, leading to occasional shortness of breath, particularly with exertion, and heart fluttering during fluid overload. Her nephrologist has adjusted her dry weight to manage these symptoms. She is able to perform daily activities such as cooking and laundry but sometimes forgets her limitations due to her health issues.  She has been on dialysis for three years, attending sessions three times a week consistently. Dialysis days leave her  feeling exhausted, but she feels okay on her off days. She produces minimal urine.  She experiences pain related to a rotator cuff issue, which was exacerbated by a recent incident involving a jammed printer. Initially, she thought she had pneumonia due to the pain, but it was determined to be a pulled muscle.  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 She is scheduled to see her pulmonologist in July to reassess her need for oxygen.  No COPD or significant leg swelling. She sometimes feels full quickly after eating and avoids lying down immediately after meals to prevent reflux.  Her current medications include metoprolol , taken once a day. She has previously taken Bydil and hydralazine  but discontinued them due to headaches and dizziness.         Current Medications: Current Meds  Medication Sig   acetaminophen  (TYLENOL ) 500 MG tablet Take 500 mg by mouth as needed for mild pain, moderate pain or fever.   aspirin  81 MG chewable tablet Chew 81 mg by mouth daily.   ferric citrate (AURYXIA) 1 GM 210 MG(Fe) tablet Take 420 mg by mouth 3 (three) times daily with meals.   LORazepam  (ATIVAN ) 0.5 MG tablet Take 0.5 mg  by mouth as needed for anxiety.   losartan  (COZAAR ) 25 MG tablet Take 1 tablet (25 mg total) by mouth daily.   metoprolol  succinate (TOPROL  XL) 25 MG 24 hr tablet Take 1 tablet (25 mg total) by mouth daily.   multivitamin (RENA-VIT) TABS tablet Take 1 tablet by mouth at bedtime.     Review of Systems:   Please see the history of present illness.    All other systems reviewed and are negative.     EKGs/Labs/Other Test Reviewed:   EKG: 2024 sinus tachycardia with left anterior fascicular block  EKG Interpretation Date/Time:  Monday May 01 2023 11:40:22 EDT Ventricular Rate:  80 PR Interval:  150 QRS Duration:  92 QT Interval:  442 QTC Calculation: 509 R Axis:   -83  Text  Interpretation: Normal sinus rhythm Left anterior fascicular block Anterolateral infarct (cited on or before 12-Dec-2021) When compared with ECG of 02-May-2022 14:42, Vent. rate has decreased BY  39 BPM T wave inversion no longer evident in Anterior leads Confirmed by Alyssa Backbone ([id number]) on 05/01/2023 11:47:46 AM         Risk Assessment/Calculations:          Physical Exam:   VS:  BP (!) 150/80   Pulse 74   Resp 16   Ht 5\' 3"  (1.6 m)   Wt 126 lb 9.6 oz (57.4 kg)   SpO2 96%   BMI 22.43 kg/m    HYPERTENSION CONTROL Vitals:   05/01/23 1144 05/01/23 1217  BP: (!) 154/78 (!) 150/80    The patient's blood pressure is elevated above target today.  In order to address the patient's elevated BP: A new medication was prescribed today.      Wt Readings from Last 3 Encounters:  05/01/23 126 lb 9.6 oz (57.4 kg)  03/20/23 125 lb (56.7 kg)  03/03/23 123 lb (55.8 kg)      GENERAL:  No apparent distress, AOx3 HEENT:  No carotid bruits, +2 carotid impulses, no scleral icterus CAR: RRR with holosystolic murmur left sternal border increases with inspiration, no gallops, rubs, or thrills RES:  Clear to auscultation bilaterally ABD:  Soft, nontender, nondistended, positive bowel sounds x 4 VASC:  +2 radial pulses, +2 carotid pulses NEURO:  CN 2-12 grossly intact; motor and sensory grossly intact PSYCH:  No active depression or anxiety EXT:  No edema, ecchymosis, or cyanosis  Signed, Leylah Tarnow K Calbert Hulsebus, MD  05/01/2023 1:26 PM    [facility] [address] [place], [place], [place]  [address] Phone: [phone]; Fax: [phone]   Note:  This document was prepared using Dragon voice recognition software and may include unintentional dictation errors.

## 2023-04-27 NOTE — Progress Notes (Addendum)
 Cardiology Office Note:   Date:  05/01/2023  ID:  Barbara Crawford, DOB 1955-12-20, MRN [id number] PCP:  Hermenia Loose  [facility] Providers Cardiologist:  Alyssa Backbone, MD Referring MD: Hermenia Loose  Chief Complaint/Reason for Referral: Severe TR ASSESSMENT:    1. Nonrheumatic tricuspid valve regurgitation   2. NICM (nonischemic cardiomyopathy) (HCC)   3. Nonobstructive atherosclerosis of coronary artery   4. Hyperlipidemia LDL goal <70   5. Aortic atherosclerosis (HCC)   6. Primary hypertension   7. ESRD (end stage renal disease) (HCC)   8. Pre-procedure lab exam   9. Heart failure with mildly reduced ejection fraction (HFmrEF) (HCC)     PLAN:   In order of problems listed above: Tricuspid regurgitation: The patient's TR looks somewhat worse on her most recent echocardiogram.  She did have substantial TR on her last echocardiogram.  Will refer the patient for right heart catheterization to evaluate further.  She is relatively asymptomatic without signs or symptoms of right-sided heart failure.  I do not think any intervention for her tricuspid valve is necessary at this point in time.  I will refer the patient for advanced heart failure opinion and medical optimization.  Certainly if she becomes more dyspneic with right-sided symptoms of heart failure then I think tricuspid intervention could be pursued. Nonischemic cardiomyopathy: Intolerant of BiDil .  On Toprol  25 mg daily.  Not a candidate for SGLT2 inhibitor given end-stage renal disease.  She is very compliant with dialysis.  She has been on dialysis for 3 years and has not missed the session.  I think she would be a good candidate for spironolactone, an ARB, or Entresto due to compliance with dialysis.  Will start losartan  25 mg..  Will refer to advanced heart failure for further recommendations.  My sense is if she can get on adequate doses of goal-directed medical therapy her TR will  improve. Nonobstructive coronary artery disease: Continue aspirin  81 mg.  Will refrain from statin given end-stage renal disease. Hyperlipidemia: Defer statin for end-stage renal disease. Hypertension: Continue Toprol  25 mg daily.  I did discuss with the patient the addition of more afterload reducing agents such as an ARB or Entresto.  Patient agrees; will start losartan  25 mg.  Will refer to advanced heart failure. End-stage renal disease: Dialysis per nephrology.  Extremely compliant with dialysis; start losartan  25 mg.           Dispo:  Return in about 6 months (around 10/31/2023).      Medication Adjustments/Labs and Tests Ordered: Current medicines are reviewed at length with the patient today.  Concerns regarding medicines are outlined above.  The following changes have been made:     Labs/tests ordered: Orders Placed This Encounter  Procedures   CBC   Basic metabolic panel with GFR   AMB referral to CHF clinic   EKG 12-Lead    Medication Changes: Meds ordered this encounter  Medications   losartan  (COZAAR ) 25 MG tablet    Sig: Take 1 tablet (25 mg total) by mouth daily.    Dispense:  90 tablet    Refill:  3    Current medicines are reviewed at length with the patient today.  The patient does not have concerns regarding medicines.  I spent 50 minutes reviewing all clinical data during and prior to this visit including all relevant imaging studies, laboratories, clinical information from other health systems and prior notes from both Cardiology and other specialties, interviewing the  patient, conducting a complete physical examination, and coordinating care in order to formulate a comprehensive and personalized evaluation and treatment plan.   History of Present Illness:      FOCUSED PROBLEM LIST:   Nonischemic cardiomyopathy EF 50 to 55%, TTE March 2022  EF 45%, G2DD TTE August 2022  EF 25%, mild LVH, G1 DD TTE October 2023 EF 30 to 35%, mild LVH, G2 DD TTE  April 2025 Intolerant of BiDil  TR Severe, malcoaptation April 2025 TTE  Moderate (looks more like moderate to severe) October 2023 TTE Mean RA 8, V waves 12; PA 41/18 PVR 1.4 cath 2023 TRISCORE 2/12 (low risk) CAD Mild to moderate; 20% prox/mid LAD, 80% D2, 10% RCA cath 2023 End-stage renal disease Hypertension Hyperlipidemia Aortic atherosclerosis Chest CT 2023 Left anterior fascicular block Chronic respiratory failure Recurrent PNA >> supplemental oxygen Followed by pulmonology ([facility])  April 2025:  Patient consents to use of AI scribe. The patient is 68 year old female with the above listed medical problems referred for conditions regarding severe TR.  The patient was first evaluated by cardiology in October 2023.  She had developed sudden shortness of breath at that time.  Her troponins were elevated.  Her echocardiogram demonstrated ejection fraction of 25%.  She underwent cardiac catheterization and right heart catheterization with findings as detailed above.  She was started on medical therapy.  She was seen in March of this year by general cardiology and was doing well.  She denied any symptoms of shortness of breath, orthopnea, or paroxysmal nocturnal dyspnea.  Her blood pressure is not well-controlled.  An echocardiogram was performed which demonstrated an ejection fraction of approximately 30 to 35% with severe TR.  She is referred for further recommendations.  She has a history of heart failure with a reduced ejection fraction of approximately 30%, leading to occasional shortness of breath, particularly with exertion, and heart fluttering during fluid overload. Her nephrologist has adjusted her dry weight to manage these symptoms. She is able to perform daily activities such as cooking and laundry but sometimes forgets her limitations due to her health issues.  She has been on dialysis for three years, attending sessions three times a week consistently. Dialysis days leave her  feeling exhausted, but she feels okay on her off days. She produces minimal urine.  She experiences pain related to a rotator cuff issue, which was exacerbated by a recent incident involving a jammed printer. Initially, she thought she had pneumonia due to the pain, but it was determined to be a pulled muscle.  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 She is scheduled to see her pulmonologist in July to reassess her need for oxygen.  No COPD or significant leg swelling. She sometimes feels full quickly after eating and avoids lying down immediately after meals to prevent reflux.  Her current medications include metoprolol , taken once a day. She has previously taken Bydil and hydralazine  but discontinued them due to headaches and dizziness.         Current Medications: Current Meds  Medication Sig   acetaminophen  (TYLENOL ) 500 MG tablet Take 500 mg by mouth as needed for mild pain, moderate pain or fever.   aspirin  81 MG chewable tablet Chew 81 mg by mouth daily.   ferric citrate (AURYXIA) 1 GM 210 MG(Fe) tablet Take 420 mg by mouth 3 (three) times daily with meals.   LORazepam  (ATIVAN ) 0.5 MG tablet Take 0.5 mg  by mouth as needed for anxiety.   losartan  (COZAAR ) 25 MG tablet Take 1 tablet (25 mg total) by mouth daily.   metoprolol  succinate (TOPROL  XL) 25 MG 24 hr tablet Take 1 tablet (25 mg total) by mouth daily.   multivitamin (RENA-VIT) TABS tablet Take 1 tablet by mouth at bedtime.     Review of Systems:   Please see the history of present illness.    All other systems reviewed and are negative.     EKGs/Labs/Other Test Reviewed:   EKG: 2024 sinus tachycardia with left anterior fascicular block  EKG Interpretation Date/Time:  Monday May 01 2023 11:40:22 EDT Ventricular Rate:  80 PR Interval:  150 QRS Duration:  92 QT Interval:  442 QTC Calculation: 509 R Axis:   -83  Text  Interpretation: Normal sinus rhythm Left anterior fascicular block Anterolateral infarct (cited on or before 12-Dec-2021) When compared with ECG of 02-May-2022 14:42, Vent. rate has decreased BY  39 BPM T wave inversion no longer evident in Anterior leads Confirmed by Alyssa Backbone ([id number]) on 05/01/2023 11:47:46 AM         Risk Assessment/Calculations:          Physical Exam:   VS:  BP (!) 150/80   Pulse 74   Resp 16   Ht 5\' 3"  (1.6 m)   Wt 126 lb 9.6 oz (57.4 kg)   SpO2 96%   BMI 22.43 kg/m    HYPERTENSION CONTROL Vitals:   05/01/23 1144 05/01/23 1217  BP: (!) 154/78 (!) 150/80    The patient's blood pressure is elevated above target today.  In order to address the patient's elevated BP: A new medication was prescribed today.      Wt Readings from Last 3 Encounters:  05/01/23 126 lb 9.6 oz (57.4 kg)  03/20/23 125 lb (56.7 kg)  03/03/23 123 lb (55.8 kg)      GENERAL:  No apparent distress, AOx3 HEENT:  No carotid bruits, +2 carotid impulses, no scleral icterus CAR: RRR with holosystolic murmur left sternal border increases with inspiration, no gallops, rubs, or thrills RES:  Clear to auscultation bilaterally ABD:  Soft, nontender, nondistended, positive bowel sounds x 4 VASC:  +2 radial pulses, +2 carotid pulses NEURO:  CN 2-12 grossly intact; motor and sensory grossly intact PSYCH:  No active depression or anxiety EXT:  No edema, ecchymosis, or cyanosis  Signed, Leylah Tarnow K Calbert Hulsebus, MD  05/01/2023 1:26 PM    [facility] [address] [place], [place], [place]  [address] Phone: [phone]; Fax: [phone]   Note:  This document was prepared using Dragon voice recognition software and may include unintentional dictation errors.

## 2023-04-28 NOTE — Telephone Encounter (Signed)
 Paperwork has been discarded since patient hasn't picked it from our office.

## 2023-04-29 DIAGNOSIS — N186 End stage renal disease: Secondary | ICD-10-CM | POA: Diagnosis not present

## 2023-04-29 DIAGNOSIS — N2581 Secondary hyperparathyroidism of renal origin: Secondary | ICD-10-CM | POA: Diagnosis not present

## 2023-04-29 DIAGNOSIS — D631 Anemia in chronic kidney disease: Secondary | ICD-10-CM | POA: Diagnosis not present

## 2023-04-29 DIAGNOSIS — D509 Iron deficiency anemia, unspecified: Secondary | ICD-10-CM | POA: Diagnosis not present

## 2023-05-01 ENCOUNTER — Ambulatory Visit: Attending: Internal Medicine | Admitting: Internal Medicine

## 2023-05-01 ENCOUNTER — Encounter: Payer: Self-pay | Admitting: Internal Medicine

## 2023-05-01 VITALS — BP 150/80 | HR 74 | Resp 16 | Ht 63.0 in | Wt 126.6 lb

## 2023-05-01 DIAGNOSIS — E785 Hyperlipidemia, unspecified: Secondary | ICD-10-CM

## 2023-05-01 DIAGNOSIS — I10 Essential (primary) hypertension: Secondary | ICD-10-CM | POA: Diagnosis not present

## 2023-05-01 DIAGNOSIS — Z01812 Encounter for preprocedural laboratory examination: Secondary | ICD-10-CM | POA: Diagnosis not present

## 2023-05-01 DIAGNOSIS — N186 End stage renal disease: Secondary | ICD-10-CM

## 2023-05-01 DIAGNOSIS — I428 Other cardiomyopathies: Secondary | ICD-10-CM | POA: Diagnosis not present

## 2023-05-01 DIAGNOSIS — I2510 Atherosclerotic heart disease of native coronary artery without angina pectoris: Secondary | ICD-10-CM

## 2023-05-01 DIAGNOSIS — I361 Nonrheumatic tricuspid (valve) insufficiency: Secondary | ICD-10-CM

## 2023-05-01 DIAGNOSIS — I700 Atherosclerosis of aorta: Secondary | ICD-10-CM

## 2023-05-01 DIAGNOSIS — I5022 Chronic systolic (congestive) heart failure: Secondary | ICD-10-CM

## 2023-05-01 MED ORDER — LOSARTAN POTASSIUM 25 MG PO TABS: 25.0000 mg | ORAL_TABLET | Freq: Every day | ORAL | 3 refills | Status: DC

## 2023-05-01 NOTE — Patient Instructions (Signed)
 Medication Instructions:  Your physician has recommended you make the following change in your medication:   1) START losartan 25 mg daily  *If you need a refill on your cardiac medications before your next appointment, please call your pharmacy*  Lab Work: TODAY (lab on 1st floor): CBC, BMET If you have labs (blood work) drawn today and your tests are completely normal, you will receive your results only by: MyChart Message (if you have MyChart) OR A paper copy in the mail If you have any lab test that is abnormal or we need to change your treatment, we will call you to review the results.  Testing/Procedures: Your physician has requested that you have a cardiac catheterization. Cardiac catheterization is used to diagnose and/or treat various heart conditions. Doctors may recommend this procedure for a number of different reasons. The most common reason is to evaluate chest pain. Chest pain can be a symptom of coronary artery disease (CAD), and cardiac catheterization can show whether plaque is narrowing or blocking your heart's arteries. This procedure is also used to evaluate the valves, as well as measure the blood flow and oxygen levels in different parts of your heart. For further information please visit [email or web address] Please follow instruction sheet, as given.   Follow-Up: At [facility], you and your health needs are our priority.  As part of our continuing mission to provide you with exceptional heart care, our providers are all part of one team.  This team includes your primary Cardiologist (physician) and Advanced Practice Providers or APPs (Physician Assistants and Nurse Practitioners) who all work together to provide you with the care you need, when you need it.  Your next appointment:   6 month(s)  Provider:   Charles Connor, NP    Other Instructions You have been referred to our Advanced Heart Failure Clinic, their office will call you to schedule an  appointment to see one of their providers.        Cardiac/Peripheral Catheterization   You are scheduled for a Cardiac Catheterization on Friday, May 9 with Dr. Alyssa Backbone.  1. Please arrive at the [facility] (Main Entrance A) at [facility]: [address] [place], [place] [address] at 7:00 AM (This time is 2 hour(s) before your procedure to ensure your preparation).   Free valet parking service is available. You will check in at ADMITTING. The support person will be asked to wait in the waiting room.  It is OK to have someone drop you off and come back when you are ready to be discharged.        Special note: Every effort is made to have your procedure done on time. Please understand that emergencies sometimes delay scheduled procedures.  2. Diet: Do not eat solid foods after midnight.  You may have clear liquids until 5 AM the day of the procedure.  3. Labs: TODAY CBC, BMET  4. Medication instructions in preparation for your procedure:   Contrast Allergy: No  On the morning of your procedure, take Aspirin  81 mg and any morning medicines NOT listed above.  You may use sips of water.  5. Plan to go home the same day, you will only stay overnight if medically necessary. 6. You MUST have a responsible adult to drive you home. 7. An adult MUST be with you the first 24 hours after you arrive home. 8. Bring a current list of your medications, and the last time and date medication taken. 9. Bring ID  and current insurance cards. 10.Please wear clothes that are easy to get on and off and wear slip-on shoes.  Thank you for allowing us  to care for you!   -- [facility]

## 2023-05-02 ENCOUNTER — Encounter: Payer: Self-pay | Admitting: Internal Medicine

## 2023-05-02 DIAGNOSIS — D509 Iron deficiency anemia, unspecified: Secondary | ICD-10-CM | POA: Diagnosis not present

## 2023-05-02 DIAGNOSIS — D631 Anemia in chronic kidney disease: Secondary | ICD-10-CM | POA: Diagnosis not present

## 2023-05-02 DIAGNOSIS — N186 End stage renal disease: Secondary | ICD-10-CM | POA: Diagnosis not present

## 2023-05-02 DIAGNOSIS — N2581 Secondary hyperparathyroidism of renal origin: Secondary | ICD-10-CM | POA: Diagnosis not present

## 2023-05-02 LAB — CBC
Hematocrit: 34.6 % (ref 34.0–46.6)
Hemoglobin: 10.7 g/dL — ABNORMAL LOW (ref 11.1–15.9)
MCH: 29.5 pg (ref 26.6–33.0)
MCHC: 30.9 g/dL — ABNORMAL LOW (ref 31.5–35.7)
MCV: 95 fL (ref 79–97)
NRBC: 1 % — ABNORMAL HIGH (ref 0–0)
Platelets: 148 10*3/uL — ABNORMAL LOW (ref 150–450)
RBC: 3.63 x10E6/uL — ABNORMAL LOW (ref 3.77–5.28)
RDW: 14.5 % (ref 11.7–15.4)
WBC: 3.4 10*3/uL (ref 3.4–10.8)

## 2023-05-02 LAB — BASIC METABOLIC PANEL WITH GFR
BUN/Creatinine Ratio: 4 — ABNORMAL LOW (ref 12–28)
BUN: 27 mg/dL (ref 8–27)
CO2: 23 mmol/L (ref 20–29)
Calcium: 8.9 mg/dL (ref 8.7–10.3)
Chloride: 100 mmol/L (ref 96–106)
Creatinine, Ser: 7.08 mg/dL — ABNORMAL HIGH (ref 0.57–1.00)
Glucose: 68 mg/dL — ABNORMAL LOW (ref 70–99)
Potassium: 4.2 mmol/L (ref 3.5–5.2)
Sodium: 143 mmol/L (ref 134–144)
eGFR: 6 mL/min/{1.73_m2} — ABNORMAL LOW (ref >59)

## 2023-05-03 DIAGNOSIS — N186 End stage renal disease: Secondary | ICD-10-CM | POA: Diagnosis not present

## 2023-05-03 DIAGNOSIS — Z992 Dependence on renal dialysis: Secondary | ICD-10-CM | POA: Diagnosis not present

## 2023-05-04 DIAGNOSIS — N2581 Secondary hyperparathyroidism of renal origin: Secondary | ICD-10-CM | POA: Diagnosis not present

## 2023-05-04 DIAGNOSIS — D631 Anemia in chronic kidney disease: Secondary | ICD-10-CM | POA: Diagnosis not present

## 2023-05-04 DIAGNOSIS — N186 End stage renal disease: Secondary | ICD-10-CM | POA: Diagnosis not present

## 2023-05-04 DIAGNOSIS — D509 Iron deficiency anemia, unspecified: Secondary | ICD-10-CM | POA: Diagnosis not present

## 2023-05-06 DIAGNOSIS — D509 Iron deficiency anemia, unspecified: Secondary | ICD-10-CM | POA: Diagnosis not present

## 2023-05-06 DIAGNOSIS — N2581 Secondary hyperparathyroidism of renal origin: Secondary | ICD-10-CM | POA: Diagnosis not present

## 2023-05-06 DIAGNOSIS — D631 Anemia in chronic kidney disease: Secondary | ICD-10-CM | POA: Diagnosis not present

## 2023-05-06 DIAGNOSIS — N186 End stage renal disease: Secondary | ICD-10-CM | POA: Diagnosis not present

## 2023-05-09 DIAGNOSIS — D631 Anemia in chronic kidney disease: Secondary | ICD-10-CM | POA: Diagnosis not present

## 2023-05-09 DIAGNOSIS — N2581 Secondary hyperparathyroidism of renal origin: Secondary | ICD-10-CM | POA: Diagnosis not present

## 2023-05-09 DIAGNOSIS — N186 End stage renal disease: Secondary | ICD-10-CM | POA: Diagnosis not present

## 2023-05-09 DIAGNOSIS — D509 Iron deficiency anemia, unspecified: Secondary | ICD-10-CM | POA: Diagnosis not present

## 2023-05-10 ENCOUNTER — Telehealth: Payer: Self-pay | Admitting: *Deleted

## 2023-05-10 NOTE — Telephone Encounter (Signed)
 Duplicate encounter

## 2023-05-10 NOTE — Telephone Encounter (Addendum)
 Right Heart Cath scheduled at [facility] for: Friday May 12, 2023 9 AM Arrival time [facility] Main Entrance A at: 7 AM  Nothing to eat after midnight prior to procedure, clear liquids until 5 AM day of procedure.  Medication instructions: -Usual morning medications can be taken with sips of water.  Plan to go home the same day, you will only stay overnight if medically necessary.  You must have responsible adult to drive you home.  Someone must be with you the first 24 hours after you arrive home.  Reviewed procedure instructions with patient.  Confirmed TTS-dialysis schedule with patient.

## 2023-05-11 DIAGNOSIS — N186 End stage renal disease: Secondary | ICD-10-CM | POA: Diagnosis not present

## 2023-05-11 DIAGNOSIS — D509 Iron deficiency anemia, unspecified: Secondary | ICD-10-CM | POA: Diagnosis not present

## 2023-05-11 DIAGNOSIS — N2581 Secondary hyperparathyroidism of renal origin: Secondary | ICD-10-CM | POA: Diagnosis not present

## 2023-05-11 DIAGNOSIS — D631 Anemia in chronic kidney disease: Secondary | ICD-10-CM | POA: Diagnosis not present

## 2023-05-12 ENCOUNTER — Other Ambulatory Visit: Payer: Self-pay

## 2023-05-12 ENCOUNTER — Ambulatory Visit (HOSPITAL_COMMUNITY)
Admission: RE | Admit: 2023-05-12 | Discharge: 2023-05-12 | Disposition: A | Discharge status: Home / Self Care | Attending: Internal Medicine | Admitting: Internal Medicine

## 2023-05-12 ENCOUNTER — Encounter (HOSPITAL_COMMUNITY)
Admission: RE | Disposition: A | Discharge status: Home / Self Care | Payer: Self-pay | Source: Home / Self Care | Attending: Internal Medicine

## 2023-05-12 DIAGNOSIS — Z79899 Other long term (current) drug therapy: Secondary | ICD-10-CM | POA: Diagnosis not present

## 2023-05-12 DIAGNOSIS — N186 End stage renal disease: Secondary | ICD-10-CM | POA: Insufficient documentation

## 2023-05-12 DIAGNOSIS — I428 Other cardiomyopathies: Secondary | ICD-10-CM | POA: Insufficient documentation

## 2023-05-12 DIAGNOSIS — E785 Hyperlipidemia, unspecified: Secondary | ICD-10-CM | POA: Insufficient documentation

## 2023-05-12 DIAGNOSIS — Z992 Dependence on renal dialysis: Secondary | ICD-10-CM | POA: Insufficient documentation

## 2023-05-12 DIAGNOSIS — I700 Atherosclerosis of aorta: Secondary | ICD-10-CM | POA: Diagnosis not present

## 2023-05-12 DIAGNOSIS — I2510 Atherosclerotic heart disease of native coronary artery without angina pectoris: Secondary | ICD-10-CM | POA: Diagnosis not present

## 2023-05-12 DIAGNOSIS — I5020 Unspecified systolic (congestive) heart failure: Secondary | ICD-10-CM | POA: Insufficient documentation

## 2023-05-12 DIAGNOSIS — Z7982 Long term (current) use of aspirin: Secondary | ICD-10-CM | POA: Diagnosis not present

## 2023-05-12 DIAGNOSIS — I132 Hypertensive heart and chronic kidney disease with heart failure and with stage 5 chronic kidney disease, or end stage renal disease: Secondary | ICD-10-CM | POA: Diagnosis not present

## 2023-05-12 DIAGNOSIS — I361 Nonrheumatic tricuspid (valve) insufficiency: Secondary | ICD-10-CM | POA: Insufficient documentation

## 2023-05-12 HISTORY — PX: RIGHT HEART CATH: CATH118263

## 2023-05-12 LAB — POCT I-STAT EG7
Acid-Base Excess: 1 mmol/L (ref 0.0–2.0)
Acid-Base Excess: 1 mmol/L (ref 0.0–2.0)
Acid-Base Excess: 2 mmol/L (ref 0.0–2.0)
Acid-Base Excess: 3 mmol/L — ABNORMAL HIGH (ref 0.0–2.0)
Acid-Base Excess: 3 mmol/L — ABNORMAL HIGH (ref 0.0–2.0)
Acid-Base Excess: 5 mmol/L — ABNORMAL HIGH (ref 0.0–2.0)
Bicarbonate: 26.8 mmol/L (ref 20.0–28.0)
Bicarbonate: 26.9 mmol/L (ref 20.0–28.0)
Bicarbonate: 27.9 mmol/L (ref 20.0–28.0)
Bicarbonate: 27.9 mmol/L (ref 20.0–28.0)
Bicarbonate: 28.8 mmol/L — ABNORMAL HIGH (ref 20.0–28.0)
Bicarbonate: 30.9 mmol/L — ABNORMAL HIGH (ref 20.0–28.0)
Calcium, Ion: 1.04 mmol/L — ABNORMAL LOW (ref 1.15–1.40)
Calcium, Ion: 1.05 mmol/L — ABNORMAL LOW (ref 1.15–1.40)
Calcium, Ion: 1.09 mmol/L — ABNORMAL LOW (ref 1.15–1.40)
Calcium, Ion: 1.09 mmol/L — ABNORMAL LOW (ref 1.15–1.40)
Calcium, Ion: 1.09 mmol/L — ABNORMAL LOW (ref 1.15–1.40)
Calcium, Ion: 1.1 mmol/L — ABNORMAL LOW (ref 1.15–1.40)
HCT: 32 % — ABNORMAL LOW (ref 36.0–46.0)
HCT: 32 % — ABNORMAL LOW (ref 36.0–46.0)
HCT: 32 % — ABNORMAL LOW (ref 36.0–46.0)
HCT: 32 % — ABNORMAL LOW (ref 36.0–46.0)
HCT: 32 % — ABNORMAL LOW (ref 36.0–46.0)
HCT: 35 % — ABNORMAL LOW (ref 36.0–46.0)
Hemoglobin: 10.9 g/dL — ABNORMAL LOW (ref 12.0–15.0)
Hemoglobin: 10.9 g/dL — ABNORMAL LOW (ref 12.0–15.0)
Hemoglobin: 10.9 g/dL — ABNORMAL LOW (ref 12.0–15.0)
Hemoglobin: 10.9 g/dL — ABNORMAL LOW (ref 12.0–15.0)
Hemoglobin: 10.9 g/dL — ABNORMAL LOW (ref 12.0–15.0)
Hemoglobin: 11.9 g/dL — ABNORMAL LOW (ref 12.0–15.0)
O2 Saturation: 80 %
O2 Saturation: 82 %
O2 Saturation: 83 %
O2 Saturation: 83 %
O2 Saturation: 85 %
O2 Saturation: 86 %
Potassium: 2.9 mmol/L — ABNORMAL LOW (ref 3.5–5.1)
Potassium: 3 mmol/L — ABNORMAL LOW (ref 3.5–5.1)
Potassium: 3 mmol/L — ABNORMAL LOW (ref 3.5–5.1)
Potassium: 3 mmol/L — ABNORMAL LOW (ref 3.5–5.1)
Potassium: 3 mmol/L — ABNORMAL LOW (ref 3.5–5.1)
Potassium: 3.2 mmol/L — ABNORMAL LOW (ref 3.5–5.1)
Sodium: 140 mmol/L (ref 135–145)
Sodium: 140 mmol/L (ref 135–145)
Sodium: 141 mmol/L (ref 135–145)
Sodium: 141 mmol/L (ref 135–145)
Sodium: 141 mmol/L (ref 135–145)
Sodium: 141 mmol/L (ref 135–145)
TCO2: 28 mmol/L (ref 22–32)
TCO2: 28 mmol/L (ref 22–32)
TCO2: 29 mmol/L (ref 22–32)
TCO2: 29 mmol/L (ref 22–32)
TCO2: 30 mmol/L (ref 22–32)
TCO2: 32 mmol/L (ref 22–32)
pCO2, Ven: 44.5 mmHg (ref 44–60)
pCO2, Ven: 45.3 mmHg (ref 44–60)
pCO2, Ven: 45.4 mmHg (ref 44–60)
pCO2, Ven: 46.7 mmHg (ref 44–60)
pCO2, Ven: 48.6 mmHg (ref 44–60)
pCO2, Ven: 49.5 mmHg (ref 44–60)
pH, Ven: 7.381 (ref 7.25–7.43)
pH, Ven: 7.382 (ref 7.25–7.43)
pH, Ven: 7.384 (ref 7.25–7.43)
pH, Ven: 7.387 (ref 7.25–7.43)
pH, Ven: 7.396 (ref 7.25–7.43)
pH, Ven: 7.403 (ref 7.25–7.43)
pO2, Ven: 45 mmHg (ref 32–45)
pO2, Ven: 48 mmHg — ABNORMAL HIGH (ref 32–45)
pO2, Ven: 48 mmHg — ABNORMAL HIGH (ref 32–45)
pO2, Ven: 49 mmHg — ABNORMAL HIGH (ref 32–45)
pO2, Ven: 50 mmHg — ABNORMAL HIGH (ref 32–45)
pO2, Ven: 52 mmHg — ABNORMAL HIGH (ref 32–45)

## 2023-05-12 SURGERY — RIGHT HEART CATH

## 2023-05-12 MED ORDER — LIDOCAINE HCL (PF) 1 % IJ SOLN
INTRAMUSCULAR | Status: DC | PRN
  Administered 2023-05-12: 5 mL

## 2023-05-12 MED ORDER — FENTANYL CITRATE (PF) 100 MCG/2ML IJ SOLN
INTRAMUSCULAR | Status: AC
  Filled 2023-05-12: qty 2

## 2023-05-12 MED ORDER — SODIUM CHLORIDE 0.9% FLUSH: 3.0000 mL | INTRAVENOUS | Status: DC | PRN

## 2023-05-12 MED ORDER — ONDANSETRON HCL 4 MG/2ML IJ SOLN: 4.0000 mg | Freq: Four times a day (QID) | INTRAMUSCULAR | Status: DC | PRN

## 2023-05-12 MED ORDER — LABETALOL HCL 5 MG/ML IV SOLN: 10.0000 mg | INTRAVENOUS | Status: DC | PRN

## 2023-05-12 MED ORDER — ACETAMINOPHEN 325 MG PO TABS: 650.0000 mg | ORAL_TABLET | ORAL | Status: DC | PRN

## 2023-05-12 MED ORDER — HEPARIN (PORCINE) IN NACL 1000-0.9 UT/500ML-% IV SOLN
INTRAVENOUS | Status: DC | PRN
  Administered 2023-05-12 (×2): 500 mL

## 2023-05-12 MED ORDER — MIDAZOLAM HCL 2 MG/2ML IJ SOLN
INTRAMUSCULAR | Status: AC
  Filled 2023-05-12: qty 2

## 2023-05-12 MED ORDER — FENTANYL CITRATE (PF) 100 MCG/2ML IJ SOLN
INTRAMUSCULAR | Status: DC | PRN
  Administered 2023-05-12 (×2): 25 ug via INTRAVENOUS

## 2023-05-12 MED ORDER — SODIUM CHLORIDE 0.9% FLUSH: 3.0000 mL | Freq: Two times a day (BID) | INTRAVENOUS | Status: DC

## 2023-05-12 MED ORDER — HYDRALAZINE HCL 20 MG/ML IJ SOLN: 10.0000 mg | INTRAMUSCULAR | Status: DC | PRN

## 2023-05-12 MED ORDER — SODIUM CHLORIDE 0.9 % IV SOLN: INTRAVENOUS | Status: DC

## 2023-05-12 MED ORDER — LIDOCAINE HCL (PF) 1 % IJ SOLN
INTRAMUSCULAR | Status: AC
  Filled 2023-05-12: qty 30

## 2023-05-12 MED ORDER — MIDAZOLAM HCL 2 MG/2ML IJ SOLN
INTRAMUSCULAR | Status: DC | PRN
  Administered 2023-05-12 (×2): 1 mg via INTRAVENOUS

## 2023-05-12 MED ORDER — SODIUM CHLORIDE 0.9 % IV SOLN: 250.0000 mL | INTRAVENOUS | Status: DC | PRN

## 2023-05-12 SURGICAL SUPPLY — 9 items
CATH BALLN WEDGE 5F 110CM (CATHETERS) IMPLANT
GUIDEWIRE .025 260CM (WIRE) IMPLANT
SHEATH GLIDE SLENDER 4/5FR (SHEATH) IMPLANT
SHEATH PINNACLE 5F 10CM (SHEATH) IMPLANT
SHEATH PROBE COVER 6X72 (BAG) IMPLANT
TRANSDUCER W/STOPCOCK (MISCELLANEOUS) IMPLANT
TUBING ART PRESS 72 MALE/FEM (TUBING) IMPLANT
WIRE ASAHI PROWATER 300CM (WIRE) IMPLANT
WIRE MICRO SET SILHO 5FR 7 (SHEATH) IMPLANT

## 2023-05-12 NOTE — Interval H&P Note (Signed)
 History and Physical Interval Note:  05/12/2023 7:19 AM  Barbara Crawford  has presented today for surgery, with the diagnosis of regurgitation.  The various methods of treatment have been discussed with the patient and family. After consideration of risks, benefits and other options for treatment, the patient has consented to  Procedure(s): RIGHT HEART CATH (N/A) as a surgical intervention.  The patient's history has been reviewed, patient examined, no change in status, stable for surgery.  I have reviewed the patient's chart and labs.  Questions were answered to the patient's satisfaction.     Tameisha Covell K Tanav Orsak

## 2023-05-12 NOTE — Discharge Instructions (Signed)
 Femoral Site Care The following information offers guidance on how to care for yourself after your procedure. Your health care provider may also give you more specific instructions. If you have problems or questions, contact your health care provider. What can I expect after the procedure? After the procedure, it is common to have bruising and tenderness at the incision site. This usually fades within 1-2 weeks. Follow these instructions at home: Incision site care  Follow instructions from your health care provider about how to take care of your incision site. Make sure you: Wash your hands with soap and water for at least 20 seconds before and after you change your bandage (dressing). If soap and water are not available, use hand sanitizer. Remove your dressing in 24 hours. Leave stitches (sutures), skin glue, or adhesive strips in place. These skin closures may need to stay in place for 2 weeks or longer. If adhesive strip edges start to loosen and curl up, you may trim the loose edges. Do not remove adhesive strips completely unless your health care provider tells you to do that. Do not take baths, swim, or use a hot tub for at least 1 week. You may shower 24 hours after the procedure or as told by your health care provider. Gently wash the incision site with plain soap and water. Pat the area dry with a clean towel. Do not rub the site. This may cause bleeding. Do not apply powder or lotion to the site. Keep the site clean and dry. Check your femoral site every day for signs of infection. Check for: Redness, swelling, or pain. Fluid or blood. Warmth. Pus or a bad smell. Activity If you were given a sedative during the procedure, it can affect you for several hours. Do not drive or operate machinery until your health care provider says that it is safe. Rest as told by your health care provider. Avoid sitting for a long time without moving. Get up to take short walks every 1-2 hours. This  is important to improve blood flow and breathing. Ask for help if you feel weak or unsteady. Return to your normal activities as told by your health care provider. Ask your health care provider what activities are safe for you and when you can return to work. Avoid activities that take a lot of effort for the first 2-3 days after your procedure, or as long as directed. Do not lift anything that is heavier than 10 lb (4.5 kg), or the limit that you are told, until your health care provider says that it is safe. General instructions Take over-the-counter and prescription medicines only as told by your health care provider. If you will be going home right after the procedure, plan to have a responsible adult care for you for the time you are told. This is important. Keep all follow-up visits. This is important. Contact a health care provider if: You have a fever or chills. You have any of these signs of infection at your incision site: Redness, swelling, or pain. Fluid or blood. Warmth. Pus or a bad smell. Get help right away if: The incision area swells very fast. The incision area is bleeding, and the bleeding does not stop when you hold steady pressure on the area. Your leg or foot becomes pale, cool, tingly, or numb. These symptoms may represent a serious problem that is an emergency. Do not wait to see if the symptoms will go away. Get medical help right away. Call your local emergency  services (911 in the [place] Do not drive yourself to the hospital. Summary After the procedure, it is common to have bruising and tenderness that fade within 1-2 weeks. Check your femoral site every day for signs of infection. Do not lift anything that is heavier than 10 lb (4.5 kg), or the limit that you are told, until your health care provider says that it is safe. Get help right away if the incision area swells very fast, you have bleeding at the incision area that does not stop, or your leg or foot  becomes pale, cool, or numb. This information is not intended to replace advice given to you by your health care provider. Make sure you discuss any questions you have with your health care provider. Document Revised: 09/09/2020 Document Reviewed: 02/09/2020 Elsevier Patient Education  2024 [employer]Brachial Site Care   This sheet gives you information about how to care for yourself after your procedure. Your health care provider may also give you more specific instructions. If you have problems or questions, contact your health care provider. What can I expect after the procedure? After the procedure, it is common to have: Bruising and tenderness at the catheter insertion area. Follow these instructions at home:  Insertion site care Follow instructions from your health care provider about how to take care of your insertion site. Make sure you: Wash your hands with soap and water before you change your bandage (dressing). If soap and water are not available, use hand sanitizer. Remove your dressing as told by your health care provider. In 24 hours Check your insertion site every day for signs of infection. Check for: Redness, swelling, or pain. Pus or a bad smell. Warmth. You may shower 24  hours after the procedure. Do not apply powder or lotion to the site.  Activity For 24 hours after the procedure, or as directed by your health care provider: Do not push or pull heavy objects with the affected arm. Do not drive yourself home from the hospital or clinic. You may drive 24 hours after the procedure unless your health care provider tells you not to. Do not lift anything that is heavier than 10 lb (4.5 kg), or the limit that you are told, until your health care provider says that it is safe.  For 24 hours

## 2023-05-13 ENCOUNTER — Encounter (HOSPITAL_COMMUNITY): Payer: Self-pay | Admitting: Internal Medicine

## 2023-05-13 DIAGNOSIS — N2581 Secondary hyperparathyroidism of renal origin: Secondary | ICD-10-CM | POA: Diagnosis not present

## 2023-05-13 DIAGNOSIS — N186 End stage renal disease: Secondary | ICD-10-CM | POA: Diagnosis not present

## 2023-05-13 DIAGNOSIS — D509 Iron deficiency anemia, unspecified: Secondary | ICD-10-CM | POA: Diagnosis not present

## 2023-05-13 DIAGNOSIS — D631 Anemia in chronic kidney disease: Secondary | ICD-10-CM | POA: Diagnosis not present

## 2023-05-16 DIAGNOSIS — N186 End stage renal disease: Secondary | ICD-10-CM | POA: Diagnosis not present

## 2023-05-16 DIAGNOSIS — D509 Iron deficiency anemia, unspecified: Secondary | ICD-10-CM | POA: Diagnosis not present

## 2023-05-16 DIAGNOSIS — D631 Anemia in chronic kidney disease: Secondary | ICD-10-CM | POA: Diagnosis not present

## 2023-05-16 DIAGNOSIS — N2581 Secondary hyperparathyroidism of renal origin: Secondary | ICD-10-CM | POA: Diagnosis not present

## 2023-05-18 DIAGNOSIS — N186 End stage renal disease: Secondary | ICD-10-CM | POA: Diagnosis not present

## 2023-05-18 DIAGNOSIS — N2581 Secondary hyperparathyroidism of renal origin: Secondary | ICD-10-CM | POA: Diagnosis not present

## 2023-05-18 DIAGNOSIS — D631 Anemia in chronic kidney disease: Secondary | ICD-10-CM | POA: Diagnosis not present

## 2023-05-18 DIAGNOSIS — D509 Iron deficiency anemia, unspecified: Secondary | ICD-10-CM | POA: Diagnosis not present

## 2023-05-20 DIAGNOSIS — N186 End stage renal disease: Secondary | ICD-10-CM | POA: Diagnosis not present

## 2023-05-20 DIAGNOSIS — D509 Iron deficiency anemia, unspecified: Secondary | ICD-10-CM | POA: Diagnosis not present

## 2023-05-20 DIAGNOSIS — N2581 Secondary hyperparathyroidism of renal origin: Secondary | ICD-10-CM | POA: Diagnosis not present

## 2023-05-20 DIAGNOSIS — D631 Anemia in chronic kidney disease: Secondary | ICD-10-CM | POA: Diagnosis not present

## 2023-05-23 DIAGNOSIS — D509 Iron deficiency anemia, unspecified: Secondary | ICD-10-CM | POA: Diagnosis not present

## 2023-05-23 DIAGNOSIS — N2581 Secondary hyperparathyroidism of renal origin: Secondary | ICD-10-CM | POA: Diagnosis not present

## 2023-05-23 DIAGNOSIS — D631 Anemia in chronic kidney disease: Secondary | ICD-10-CM | POA: Diagnosis not present

## 2023-05-23 DIAGNOSIS — N186 End stage renal disease: Secondary | ICD-10-CM | POA: Diagnosis not present

## 2023-05-24 DIAGNOSIS — J018 Other acute sinusitis: Secondary | ICD-10-CM | POA: Diagnosis not present

## 2023-05-25 DIAGNOSIS — D509 Iron deficiency anemia, unspecified: Secondary | ICD-10-CM | POA: Diagnosis not present

## 2023-05-25 DIAGNOSIS — D631 Anemia in chronic kidney disease: Secondary | ICD-10-CM | POA: Diagnosis not present

## 2023-05-25 DIAGNOSIS — N186 End stage renal disease: Secondary | ICD-10-CM | POA: Diagnosis not present

## 2023-05-25 DIAGNOSIS — N2581 Secondary hyperparathyroidism of renal origin: Secondary | ICD-10-CM | POA: Diagnosis not present

## 2023-05-27 DIAGNOSIS — D509 Iron deficiency anemia, unspecified: Secondary | ICD-10-CM | POA: Diagnosis not present

## 2023-05-27 DIAGNOSIS — D631 Anemia in chronic kidney disease: Secondary | ICD-10-CM | POA: Diagnosis not present

## 2023-05-27 DIAGNOSIS — N2581 Secondary hyperparathyroidism of renal origin: Secondary | ICD-10-CM | POA: Diagnosis not present

## 2023-05-27 DIAGNOSIS — N186 End stage renal disease: Secondary | ICD-10-CM | POA: Diagnosis not present

## 2023-05-30 DIAGNOSIS — D509 Iron deficiency anemia, unspecified: Secondary | ICD-10-CM | POA: Diagnosis not present

## 2023-05-30 DIAGNOSIS — D631 Anemia in chronic kidney disease: Secondary | ICD-10-CM | POA: Diagnosis not present

## 2023-05-30 DIAGNOSIS — N2581 Secondary hyperparathyroidism of renal origin: Secondary | ICD-10-CM | POA: Diagnosis not present

## 2023-05-30 DIAGNOSIS — N186 End stage renal disease: Secondary | ICD-10-CM | POA: Diagnosis not present

## 2023-06-01 DIAGNOSIS — D509 Iron deficiency anemia, unspecified: Secondary | ICD-10-CM | POA: Diagnosis not present

## 2023-06-01 DIAGNOSIS — N186 End stage renal disease: Secondary | ICD-10-CM | POA: Diagnosis not present

## 2023-06-01 DIAGNOSIS — N2581 Secondary hyperparathyroidism of renal origin: Secondary | ICD-10-CM | POA: Diagnosis not present

## 2023-06-01 DIAGNOSIS — D631 Anemia in chronic kidney disease: Secondary | ICD-10-CM | POA: Diagnosis not present

## 2023-06-03 DIAGNOSIS — N2581 Secondary hyperparathyroidism of renal origin: Secondary | ICD-10-CM | POA: Diagnosis not present

## 2023-06-03 DIAGNOSIS — N186 End stage renal disease: Secondary | ICD-10-CM | POA: Diagnosis not present

## 2023-06-03 DIAGNOSIS — D631 Anemia in chronic kidney disease: Secondary | ICD-10-CM | POA: Diagnosis not present

## 2023-06-03 DIAGNOSIS — D509 Iron deficiency anemia, unspecified: Secondary | ICD-10-CM | POA: Diagnosis not present

## 2023-06-05 ENCOUNTER — Encounter (HOSPITAL_COMMUNITY): Admitting: Cardiology

## 2023-06-09 ENCOUNTER — Ambulatory Visit: Admitting: Dietician

## 2023-06-14 ENCOUNTER — Ambulatory Visit: Admitting: Nurse Practitioner

## 2023-06-15 DIAGNOSIS — Z992 Dependence on renal dialysis: Secondary | ICD-10-CM

## 2023-06-15 DIAGNOSIS — I132 Hypertensive heart and chronic kidney disease with heart failure and with stage 5 chronic kidney disease, or end stage renal disease: Secondary | ICD-10-CM | POA: Diagnosis present

## 2023-06-15 DIAGNOSIS — Z7982 Long term (current) use of aspirin: Secondary | ICD-10-CM

## 2023-06-21 ENCOUNTER — Encounter (HOSPITAL_COMMUNITY): Admitting: Cardiology

## 2023-06-23 ENCOUNTER — Ambulatory Visit: Admitting: Dietician

## 2023-07-12 ENCOUNTER — Encounter (HOSPITAL_COMMUNITY): Admitting: Cardiology

## 2023-08-04 ENCOUNTER — Encounter: Attending: Nurse Practitioner | Admitting: Dietician

## 2023-08-04 ENCOUNTER — Encounter: Payer: Self-pay | Admitting: Dietician

## 2023-08-04 DIAGNOSIS — N186 End stage renal disease: Secondary | ICD-10-CM | POA: Insufficient documentation

## 2023-08-04 DIAGNOSIS — Z992 Dependence on renal dialysis: Secondary | ICD-10-CM | POA: Insufficient documentation

## 2023-08-04 DIAGNOSIS — N185 Chronic kidney disease, stage 5: Secondary | ICD-10-CM | POA: Diagnosis present

## 2023-08-04 DIAGNOSIS — Z713 Dietary counseling and surveillance: Secondary | ICD-10-CM | POA: Insufficient documentation

## 2023-08-04 DIAGNOSIS — E639 Nutritional deficiency, unspecified: Secondary | ICD-10-CM | POA: Insufficient documentation

## 2023-08-04 NOTE — Patient Instructions (Addendum)
 Great job on changes made! Continue to have consistent meals with good nutrition.  Plan simple meals  Use the crock pot to have food ready when you get home from dialysis.  Plan ahead  Pack your lunch for dialysis   LS [place] sandwich on white, raw vegetables, fruit   Fruit, swiss or LS cheese, LS crackers, raw vegetables   Wrap with chicken or beans, lettuce, vege of choice, fruit  Quality nutrition in your snacks and meals Avoid skipping meals Mindful eating out - ask for foods to be prepared without salt

## 2023-08-04 NOTE — Progress Notes (Signed)
 Medical Nutrition Therapy  Appointment Start time:  1116 Appointment End time:  1145 She was last seen by this RD on 03/03/2023.  States that she had a heart cath and has a leak in her left ventricle. Appetite is good.   She states that she has been taking food to dialysis (1/2 sandwich and fruit to HD). Reports increased energy and strength. She is cooking more. She is currently in therapy to deal with her health and childhood.  States that she gets depressed at times. She walks with a cane.  Primary concerns today: states that her phosphorous labs are variable.  States that her appetite varies a lot. Some foods give her Referral diagnosis: ESRD and nutritional deficiency Preferred learning style: no preference indicated Learning readiness: ready  NUTRITION ASSESSMENT  63 123 lbs 03/03/2023 and 08/04/2023 Wt Readings from Last 3 Encounters:  05/12/23 123 lb (55.8 kg)  05/01/23 126 lb 9.6 oz (57.4 kg)  03/20/23 125 lb (56.7 kg)   Clinical Medical Hx: ESRD on HD (2022), CHF, home oxygen , HTN Medications: see list Labs:  noted Notable Signs/Symptoms: none Basic Nutrition Focused Physical exam completed.  Lack of muscle at clavicle region noted.   Patient skips meals due to dialysis, nutrition quality fair, inadequate protein due to skipping meals, aims to eat low sodium but due to energy level eats fast food at times.  Lifestyle & Dietary Hx Patient lives with her daughter.  She is trying to cook more at home.  They share shopping and cooking. She reads labels and limits her sodium intake. She states that she tries to follow her fluid restriction but states that she is more dehydrated on dialysis day.  Likes to eat ice. Lactose intolerant. She moved from [place] in 2015.  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 Uses a walker.  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, renal vit Sleep:  good on dialysis days and fair on other days as she takes a nap Stress / self-care: fair Current average weekly physical activity: ADL's uses a walker  24-Hr Dietary Recall First Meal: American cheese on a bagel Snack: none Second Meal: 1/2 LS malawi sandwich with mayo Snack: snowball cake Third Meal: baked chicken, rice Snack: LS potato chip Beverages: water, VidaFuel (90 calories, 16 grams protein, 6 g carbohydrates in 2 oz shot), gingerale, cranberry juice, lemonade   Estimated Energy Needs Calories: 1700-1800 Protein: 65-75g  NUTRITION DIAGNOSIS  NB-1.1 Food and nutrition-related knowledge deficit As related to balance of carbohydrates, protein, and fat re: HD.  As evidenced by diet hx and patient report.   NUTRITION INTERVENTION  Nutrition education (E-1) on the following topics:  Discussion of her current habits to improve her nutrition Discussed tips to increase nutrition composition of meals Sodium and label reading Portion sizes to improve nutritional intake  Handouts Provided Include  (initial visit) Copies from the Choose a Meal book  Learning Style & Readiness for Change Teaching method utilized: Visual & Auditory  Demonstrated degree of understanding via: Teach Back  Barriers to learning/adherence to lifestyle change: energy, health  Goals Established by Pt Great job on changes made! Continue to have consistent meals with good nutrition.  Plan simple meals  Use the crock pot to have food ready when you get home from dialysis.  Plan ahead  Pack your lunch for dialysis   LS malawi sandwich on white, raw  vegetables, fruit   Fruit, swiss or LS cheese, LS crackers, raw vegetables   Wrap with chicken or beans, lettuce, vege of choice, fruit  Quality nutrition in your snacks and meals Avoid skipping meals Mindful eating out - ask for foods to be prepared without salt  MONITORING & EVALUATION Dietary intake, weekly physical activity, in 2 months.  Next  Steps  Patient is to call for questions.

## 2023-08-10 ENCOUNTER — Telehealth (HOSPITAL_BASED_OUTPATIENT_CLINIC_OR_DEPARTMENT_OTHER): Payer: Self-pay

## 2023-08-10 DIAGNOSIS — N185 Chronic kidney disease, stage 5: Secondary | ICD-10-CM

## 2023-08-10 DIAGNOSIS — E639 Nutritional deficiency, unspecified: Secondary | ICD-10-CM

## 2023-08-10 NOTE — Telephone Encounter (Signed)
-----   Message from Josefa CHRISTELLA Beauvais sent at 08/04/2023  1:27 PM EDT ----- Covering for Marnee Maine to continue with nutrition and diabetes education services for CKD.  Please place order.  Thank you for your help.  Josefa CHRISTELLA. Cleaver NP-C  [image]   08/04/2023, 1:28 PM [facility] [address] Office [phone] Fax [phone] ----- Message ----- From: Knox Leita CROME, RD Sent: 08/04/2023   1:16 PM EDT To: Jackee VEAR Wyn Mickey., NP  Patient would like to continue seeing me.  Please enter a new referral to Nutrition and Diabetes Education Services for CKD.  Thanks.  Leita Knox, RD, LDN, CDCES, DipACLM

## 2023-09-06 ENCOUNTER — Encounter (HOSPITAL_COMMUNITY): Admitting: Cardiology

## 2023-09-06 ENCOUNTER — Encounter (HOSPITAL_COMMUNITY): Payer: Self-pay

## 2023-09-07 ENCOUNTER — Telehealth (HOSPITAL_COMMUNITY): Payer: Self-pay | Admitting: Cardiology

## 2023-10-26 ENCOUNTER — Emergency Department (HOSPITAL_COMMUNITY)
Admission: EM | Admit: 2023-10-26 | Discharge: 2023-10-27 | Disposition: A | Discharge status: Home / Self Care | Attending: Emergency Medicine | Admitting: Emergency Medicine

## 2023-10-26 DIAGNOSIS — D696 Thrombocytopenia, unspecified: Secondary | ICD-10-CM | POA: Insufficient documentation

## 2023-10-26 DIAGNOSIS — S4992XA Unspecified injury of left shoulder and upper arm, initial encounter: Secondary | ICD-10-CM | POA: Diagnosis present

## 2023-10-26 DIAGNOSIS — Z7982 Long term (current) use of aspirin: Secondary | ICD-10-CM | POA: Diagnosis not present

## 2023-10-26 DIAGNOSIS — R58 Hemorrhage, not elsewhere classified: Secondary | ICD-10-CM

## 2023-10-26 DIAGNOSIS — S41112A Laceration without foreign body of left upper arm, initial encounter: Secondary | ICD-10-CM | POA: Insufficient documentation

## 2023-10-26 DIAGNOSIS — Z992 Dependence on renal dialysis: Secondary | ICD-10-CM | POA: Insufficient documentation

## 2023-10-26 DIAGNOSIS — N186 End stage renal disease: Secondary | ICD-10-CM | POA: Diagnosis not present

## 2023-10-26 DIAGNOSIS — X58XXXA Exposure to other specified factors, initial encounter: Secondary | ICD-10-CM | POA: Insufficient documentation

## 2023-10-26 NOTE — ED Triage Notes (Addendum)
 PT arrives via EMS from home with a complaint of her dialysis fistula bleeding after her appointment today. Pt reports her dialysis went well, but the graft started bleeding again once she got home, was unable to stop it with pressure at home. Pt arrives with a dressing from EMS which is saturated, puncture wound slowly oozing blood. Pt has no other complaints.

## 2023-10-27 ENCOUNTER — Telehealth (HOSPITAL_COMMUNITY): Payer: Self-pay | Admitting: Cardiology

## 2023-10-27 ENCOUNTER — Telehealth: Payer: Self-pay

## 2023-10-27 LAB — COMPREHENSIVE METABOLIC PANEL WITH GFR
ALT: 10 U/L (ref 0–44)
AST: 17 U/L (ref 15–41)
Albumin: 2.8 g/dL — ABNORMAL LOW (ref 3.5–5.0)
Alkaline Phosphatase: 43 U/L (ref 38–126)
Anion gap: 12 (ref 5–15)
BUN: 17 mg/dL (ref 8–23)
CO2: 27 mmol/L (ref 22–32)
Calcium: 8.4 mg/dL — ABNORMAL LOW (ref 8.9–10.3)
Chloride: 96 mmol/L — ABNORMAL LOW (ref 98–111)
Creatinine, Ser: 4.75 mg/dL — ABNORMAL HIGH (ref 0.44–1.00)
GFR, Estimated: 10 mL/min — ABNORMAL LOW (ref >60)
Glucose, Bld: 109 mg/dL — ABNORMAL HIGH (ref 70–99)
Potassium: 3.2 mmol/L — ABNORMAL LOW (ref 3.5–5.1)
Sodium: 135 mmol/L (ref 135–145)
Total Bilirubin: 0.6 mg/dL (ref 0.0–1.2)
Total Protein: 7.5 g/dL (ref 6.5–8.1)

## 2023-10-27 LAB — CBC WITH DIFFERENTIAL/PLATELET
Abs Immature Granulocytes: 0.01 K/uL (ref 0.00–0.07)
Basophils Absolute: 0.1 K/uL (ref 0.0–0.1)
Basophils Relative: 2 %
Eosinophils Absolute: 0.1 K/uL (ref 0.0–0.5)
Eosinophils Relative: 2 %
HCT: 33.4 % — ABNORMAL LOW (ref 36.0–46.0)
Hemoglobin: 10.1 g/dL — ABNORMAL LOW (ref 12.0–15.0)
Immature Granulocytes: 0 %
Lymphocytes Relative: 21 %
Lymphs Abs: 0.7 K/uL (ref 0.7–4.0)
MCH: 29.7 pg (ref 26.0–34.0)
MCHC: 30.2 g/dL (ref 30.0–36.0)
MCV: 98.2 fL (ref 80.0–100.0)
Monocytes Absolute: 0.7 K/uL (ref 0.1–1.0)
Monocytes Relative: 21 %
Neutro Abs: 1.8 K/uL (ref 1.7–7.7)
Neutrophils Relative %: 54 %
Platelets: 62 K/uL — ABNORMAL LOW (ref 150–400)
RBC: 3.4 MIL/uL — ABNORMAL LOW (ref 3.87–5.11)
RDW: 14.7 % (ref 11.5–15.5)
WBC: 3.3 K/uL — ABNORMAL LOW (ref 4.0–10.5)
nRBC: 0 % (ref 0.0–0.2)

## 2023-10-27 LAB — PROTIME-INR
INR: 1.1 (ref 0.8–1.2)
Prothrombin Time: 15 s (ref 11.4–15.2)

## 2023-10-27 MED ORDER — TRANEXAMIC ACID FOR EPISTAXIS
500.0000 mg | Freq: Once | TOPICAL | Status: AC
  Administered 2023-10-27: 500 mg via TOPICAL
  Filled 2023-10-27: qty 10

## 2023-10-27 NOTE — Telephone Encounter (Signed)
 Patient called [facility] asking if she should go to dialysis on 10/28/23 given she just got released from the ED after bleeding fistula.  Patient advised to go to dialysis and they will make any determination about safety of cannulation.    Patient knows to apply pressure to her arm and call EMS should she begin to bleed.

## 2023-10-27 NOTE — ED Notes (Addendum)
 Dr. Lorette at bedside and placed 1 stitch over patient's puncture wound on her fistula. Puncture sight bleeding has slowed to a slow drip, still hasn't stopped.

## 2023-10-27 NOTE — ED Provider Notes (Signed)
 [place] EMERGENCY DEPARTMENT AT [facility] Provider Note   CSN: [id number] Arrival date & time: 10/26/23  2334     Patient presents with: Vascular Access Problem   Barbara Crawford is a 68 y.o. female.   68 year old female with a history of end-stage renal disease on dialysis Tuesday Thursday Saturday.  With dialysis as per normal today.  After removing the needles she blood normally and they dressed her left AV fistula normally.  She states she went home and took a nap.  When she woke up blood was coming from the wound and had saturated a couple towels beneath her arm.  They could not get it to stop at home so they called EMS.  EMS placed a pressure dressing and bleeding has stopped at this point.  Patient does not feel lightheaded or short of breath or weak.  No other associated symptoms.        Prior to Admission medications   Medication Sig Start Date End Date Taking? Authorizing Provider  acetaminophen  (TYLENOL ) 500 MG tablet Take 500 mg by mouth as needed for mild pain, moderate pain or fever.    [provider]  aspirin  EC 81 MG tablet Take 81 mg by mouth daily.    [provider]  ferric citrate (AURYXIA) 1 GM 210 MG(Fe) tablet Take 210-420 mg by mouth See admin instructions. 420 mg with meals, 210 mg with snacks    [provider]  LORazepam  (ATIVAN ) 0.5 MG tablet Take 0.5 mg by mouth 2 (two) times daily as needed for anxiety. 10/18/21   [provider]  losartan  (COZAAR ) 25 MG tablet Take 1 tablet (25 mg total) by mouth daily. 05/01/23   Thukkani, Arun K, MD  metoprolol  succinate (TOPROL  XL) 25 MG 24 hr tablet Take 1 tablet (25 mg total) by mouth daily. 04/11/23   Wyn Jackee VEAR Mickey., NP  multivitamin (RENA-VIT) TABS tablet Take 1 tablet by mouth at bedtime.    [provider]  OXYGEN  Inhale 2 L/min into the lungs continuous.    [provider]    Allergies: Bee venom; Shellfish protein-containing drug  products; Azithromycin ; Egg solids, whole; Bidil  [isosorb dinitrate-hydralazine ]; Chlorhexidine ; Egg protein-containing drug products; Erythromycin; Hydralazine ; Iodine; and Penicillins    Review of Systems  Updated Vital Signs BP (!) 148/64 (BP Location: Right Arm)   Pulse 76   Temp 98.7 F (37.1 C) (Oral)   Resp 14   Ht 5' 3 (1.6 m)   Wt 56.2 kg   SpO2 100%   BMI 21.97 kg/m   Physical Exam Vitals and nursing note reviewed.  Constitutional:      Appearance: She is well-developed.  HENT:     Head: Normocephalic and atraumatic.  Cardiovascular:     Rate and Rhythm: Normal rate and regular rhythm.     Comments: Patient with pressure dressing in place cannot evaluate her fistula but has distal radial pulse and no obvious edema to the extremity.  Does have blood soaked paper tape above with a pressure dressing.  No obvious active bleeding. Pulmonary:     Effort: No respiratory distress.     Breath sounds: No stridor.  Abdominal:     General: There is no distension.  Musculoskeletal:     Cervical back: Normal range of motion.  Neurological:     Mental Status: She is alert.     (all labs ordered are listed, but only abnormal results are displayed) Labs Reviewed  CBC WITH  DIFFERENTIAL/PLATELET - Abnormal; Notable for the following components:      Result Value   WBC 3.3 (*)    RBC 3.40 (*)    Hemoglobin 10.1 (*)    HCT 33.4 (*)    Platelets 62 (*)    All other components within normal limits  COMPREHENSIVE METABOLIC PANEL WITH GFR - Abnormal; Notable for the following components:   Potassium 3.2 (*)    Chloride 96 (*)    Glucose, Bld 109 (*)    Creatinine, Ser 4.75 (*)    Calcium  8.4 (*)    Albumin  2.8 (*)    GFR, Estimated 10 (*)    All other components within normal limits  PROTIME-INR    EKG: None  Radiology: No results found.   .Critical Care  Performed by: Lorette Mayo, MD Authorized by: Lorette Mayo, MD   Critical care provider statement:     Critical care time (minutes):  30   Critical care was necessary to treat or prevent imminent or life-threatening deterioration of the following conditions:  Circulatory failure   Critical care was time spent personally by me on the following activities:  Development of treatment plan with patient or surrogate, discussions with consultants, evaluation of patient's response to treatment, examination of patient, ordering and review of laboratory studies, ordering and review of radiographic studies, ordering and performing treatments and interventions, pulse oximetry, re-evaluation of patient's condition and review of old charts .Laceration Repair  Date/Time: 10/27/2023 7:15 AM  Performed by: Lorette Mayo, MD Authorized by: Lorette Mayo, MD   Consent:    Consent obtained:  Verbal   Consent given by:  Patient   Risks, benefits, and alternatives were discussed: yes     Risks discussed:  Infection, need for additional repair, nerve damage, poor wound healing, poor cosmetic result, pain and retained foreign body   Alternatives discussed:  No treatment, delayed treatment, observation and referral Universal protocol:    Procedure explained and questions answered to patient or proxy's satisfaction: yes     Immediately prior to procedure, a time out was called: yes     Patient identity confirmed:  Verbally with patient and arm band Anesthesia:    Anesthesia method:  Topical application   Topical anesthetic:  LET Laceration details:    Location:  Shoulder/arm   Shoulder/arm location:  L upper arm   Length (cm):  0.2 Pre-procedure details:    Preparation:  Patient was prepped and draped in usual sterile fashion Exploration:    Wound exploration: wound explored through full range of motion and entire depth of wound visualized   Treatment:    Area cleansed with:  Shur-Clens and soap and water   Amount of cleaning:  Standard   Irrigation solution:  Sterile water   Irrigation method:  Syringe Skin  repair:    Repair method:  Sutures   Suture size:  4-0   Suture material:  Prolene   Suture technique:  Simple interrupted   Number of sutures:  2 Approximation:    Approximation:  Close Repair type:    Repair type:  Simple Post-procedure details:    Dressing:  Sterile dressing and tube gauze   Procedure completion:  Tolerated well, no immediate complications    Medications Ordered in the ED  tranexamic acid (CYKLOKAPRON) 1000 MG/10ML topical solution 500 mg (500 mg Topical Given 10/27/23 0441)  Medical Decision Making Amount and/or Complexity of Data Reviewed Labs: ordered.   Will be pressure dressing in place for an hour or 2 before taking it off and evaluating the site. Due to ESRD and reported amount of bleeding will check labs.  Patient with thrombocytopenia but near baseline.    Initially removed pressure dressing and had persistent oozing from the needle site.  Discussed with vascular surgery (Dr. Magda) who recommended suture and follow-up in the office for arteriogram.  Suture placed with significant improvement in bleeding but still persistently oozing around it.  Attempted topical thrombin without improvement.  Attempted placing a second suture which significantly slowed down the bleeding again.  Used TXA soaked gauze and a light pressure dressing with resolution of bleeding.  Patient still with good thrill and distal pulses after 45 minutes of dressing in place with no lower arm edema.  Patient's hemoglobin is stable.  Stable for discharge. Final diagnoses:  Bleeding  Thrombocytopenia    ED Discharge Orders     None          Bijan Ridgley, Selinda, MD 10/27/23 [phone]

## 2023-10-27 NOTE — Telephone Encounter (Signed)
 Called to confirm/remind patient of their appointment at the [facility] on 10/27/23.   Appointment:   [] Confirmed  [x] Left mess   [] No answer/No voice mail  [] VM Full/unable to leave message  [] Phone not in service  Patient reminded to bring all medications and/or complete list.  Confirmed patient has transportation. Gave directions, instructed to utilize valet parking.

## 2023-10-30 ENCOUNTER — Encounter (HOSPITAL_COMMUNITY): Admitting: Cardiology

## 2023-11-05 ENCOUNTER — Other Ambulatory Visit: Payer: Self-pay | Admitting: Nurse Practitioner

## 2023-11-06 ENCOUNTER — Other Ambulatory Visit: Payer: Self-pay | Admitting: Cardiology

## 2023-11-10 ENCOUNTER — Ambulatory Visit: Admitting: Dietician

## 2023-11-16 NOTE — Progress Notes (Unsigned)
 Cardiology Office Note:   Date:  11/17/2023  ID:  Dalal Livengood, DOB [birth year], MRN [id number] PCP:  Tammy Tari ONEIDA DEVONNA  [facility] Providers Cardiologist:  Wendel Haws, MD Referring MD: Tammy Tari ONEIDA DEVONNA  Chief Complaint/Reason for Referral: Severe TR ASSESSMENT:    1. Nonrheumatic tricuspid valve regurgitation   2. NICM (nonischemic cardiomyopathy) (HCC)   3. Nonobstructive atherosclerosis of coronary artery   4. Hyperlipidemia LDL goal <70   5. Aortic atherosclerosis   6. Primary hypertension   7. ESRD (end stage renal disease) (HCC)      PLAN:   In order of problems listed above: Tricuspid regurgitation:   No significant right sided symptoms.  TR likely still severe be exam but patient is asymptomatic.  Will obtain echocardiogram.  Patient is seeing advanced heart failure soon. Nonischemic cardiomyopathy: Intolerant of BiDil .  Continue Toprol  25 mg daily, losartan  25mg .  Not a candidate for SGLT2 inhibitor given end-stage renal disease.  She is very compliant with dialysis.  She has been on dialysis for 3 years and has not missed the session.  I think she would be a good candidate for spironolactone.   Patient to see advanced heart failure.    Nonobstructive coronary artery disease: Continue aspirin  81 mg.  Will refrain from statin given end-stage renal disease. Hyperlipidemia: Defer statin for end-stage renal disease. Aortic atherosclerosis: Continue aspirin  81 mg; defer statin Hypertension: Continue Toprol  25 mg daily, losartan  25 mg.  Patient tells me blood pressures at home are in the 120s to 130s systolic.  She tells me she has whitecoat hypertension. End-stage renal disease: Dialysis per nephrology.  Extremely compliant with dialysis; continue losartan  25 mg.           Dispo:  Return in about 6 months (around 05/16/2024).      Medication Adjustments/Labs and Tests Ordered: Current medicines are reviewed at length with the patient today.   Concerns regarding medicines are outlined above.  The following changes have been made:     Labs/tests ordered: Orders Placed This Encounter  Procedures   ECHOCARDIOGRAM COMPLETE    Medication Changes: No orders of the defined types were placed in this encounter.   Current medicines are reviewed at length with the patient today.  The patient does not have concerns regarding medicines.  I spent 33 minutes reviewing all clinical data during and prior to this visit including all relevant imaging studies, laboratories, clinical information from other health systems and prior notes from both Cardiology and other specialties, interviewing the patient, conducting a complete physical examination, and coordinating care in order to formulate a comprehensive and personalized evaluation and treatment plan.   History of Present Illness:      FOCUSED PROBLEM LIST:   Nonischemic cardiomyopathy EF 50 to 55%, TTE March 2022  EF 45%, G2DD TTE August 2022  EF 25%, mild LVH, G1 DD TTE October 2023 EF 30 to 35%, mild LVH, G2 DD TTE April 2025 Intolerant of BiDil  TR Severe, malcoaptation April 2025 TTE  Moderate (looks more like moderate to severe) October 2023 TTE Mean RA 8, V waves 12; PA 41/18 PVR 1.4 cath 2023 TRISCORE 2/12 (low risk) Echo RV/PA coupling index 71mm/37.66mmHg=0.58 Invasive RV/PA coupling index 85mm/49mmHg=0.44 CAD Mild to moderate; 20% prox/mid LAD, 80% D2, 10% RCA cath 2023 End-stage renal disease Hypertension Hyperlipidemia Aortic atherosclerosis Chest CT 2023 Left anterior fascicular block Chronic respiratory failure Recurrent PNA >> supplemental oxygen  Followed by pulmonology ([facility])  April 2025:  Patient consents  to use of AI scribe. The patient is 68 year old female with the above listed medical problems referred for conditions regarding severe TR.  The patient was first evaluated by cardiology in October 2023.  She had developed sudden shortness of breath at that  time.  Her troponins were elevated.  Her echocardiogram demonstrated ejection fraction of 25%.  She underwent cardiac catheterization and right heart catheterization with findings as detailed above.  She was started on medical therapy.  She was seen in March of this year by general cardiology and was doing well.  She denied any symptoms of shortness of breath, orthopnea, or paroxysmal nocturnal dyspnea.  Her blood pressure is not well-controlled.  An echocardiogram was performed which demonstrated an ejection fraction of approximately 30 to 35% with severe TR.  She is referred for further recommendations.  She has a history of heart failure with a reduced ejection fraction of approximately 30%, leading to occasional shortness of breath, particularly with exertion, and heart fluttering during fluid overload. Her nephrologist has adjusted her dry weight to manage these symptoms. She is able to perform daily activities such as cooking and laundry but sometimes forgets her limitations due to her health issues.  She has been on dialysis for three years, attending sessions three times a week consistently. Dialysis days leave her feeling exhausted, but she feels okay on her off days. She produces minimal urine.  She experiences pain related to a rotator cuff issue, which was exacerbated by a recent incident involving a jammed printer. Initially, she thought she had pneumonia due to the pain, but it was determined to be a pulled muscle.  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 She is scheduled to see her pulmonologist in July to reassess her need for oxygen .  No COPD or significant leg swelling. She sometimes feels full quickly after eating and avoids lying down immediately after meals to prevent reflux.  Her current medications include metoprolol , taken once a day. She has previously taken Bydil and hydralazine  but discontinued  them due to headaches and dizziness.  Plan: Refer for right heart catheterization; heart failure opinion.  Start losartan  25 mg.  November 2025:  Patient consents to use of AI scribe. Right heart catheterization demonstrated preserved cardiac output and index.  PVR was 2.0 Woods units and PA pulsatility index was 2.2.  Her PA pressure was 49/18 with a mean of 32 mmHg.  She is scheduled to see advanced heart failure later this month.  She is here for routine follow-up.  She is compliant with her dialysis sessions, attending them every Tuesday, Thursday, and Saturday. Her home blood pressure ranges from 120/60 to 130s and tends to decrease after a couple of hours. During dialysis, her blood pressure ranges from 142 to 150.  She is scheduled to see a new doctor next Wednesday as her previous doctor is no longer available. She uses a wheelchair only for long distances during visits and is not dependent on it at home. She uses oxygen  as needed, particularly when she feels fluid accumulation, which she notices from Saturday to Tuesday. She uses the oxygen  concentrator at home for short periods when needed.  She is currently taking losartan  and feels better after starting the medication. She emphasizes her compliance with dialysis, stating 'I faithfully go to my treatments.'  No shortness of breath, swelling in her legs, and she reports a good appetite.  Current Medications: No outpatient medications have been marked as taking for the 11/17/23 encounter (Office Visit) with Shailyn Weyandt K, MD.     Review of Systems:   Please see the history of present illness.    All other systems reviewed and are negative.     EKGs/Labs/Other Test Reviewed:   EKG: 2025 normal sinus rhythm left anterior fascicular block  EKG Interpretation Date/Time:    Ventricular Rate:    PR Interval:    QRS Duration:    QT Interval:    QTC Calculation:   R Axis:      Text Interpretation:           Risk  Assessment/Calculations:          Physical Exam:   VS:  BP (!) 160/70   Pulse 86   Ht 5' 3 (1.6 m)   Wt 123 lb (55.8 kg)   SpO2 96%   BMI 21.79 kg/m    HYPERTENSION CONTROL Vitals:   11/17/23 0925 11/17/23 0940  BP: (!) 165/79 (!) 160/70    The patient's blood pressure is elevated above target today.  In order to address the patient's elevated BP: Blood pressure will be monitored at home to determine if medication changes need to be made.      Wt Readings from Last 3 Encounters:  11/17/23 123 lb (55.8 kg)  10/26/23 124 lb (56.2 kg)  05/12/23 123 lb (55.8 kg)      GENERAL:  No apparent distress, AOx3 HEENT:  No carotid bruits, +2 carotid impulses, no scleral icterus CAR: RRR with holosystolic murmur left sternal border increases with inspiration, no gallops, rubs, or thrills RES:  Clear to auscultation bilaterally ABD:  Soft, nontender, nondistended, positive bowel sounds x 4 VASC:  +2 radial pulses, +2 carotid pulses NEURO:  CN 2-12 grossly intact; motor and sensory grossly intact PSYCH:  No active depression or anxiety EXT:  No edema, ecchymosis, or cyanosis  Signed, Lurena MARLA Red, MD  11/17/2023 10:03 AM    [facility] [address] [place], [place], [place]  [address] Phone: [phone]; Fax: [phone]   Note:  This document was prepared using Dragon voice recognition software and may include unintentional dictation errors.

## 2023-11-17 ENCOUNTER — Encounter: Payer: Self-pay | Admitting: Internal Medicine

## 2023-11-17 ENCOUNTER — Ambulatory Visit: Attending: Internal Medicine | Admitting: Internal Medicine

## 2023-11-17 VITALS — BP 160/70 | HR 86 | Ht 63.0 in | Wt 123.0 lb

## 2023-11-17 DIAGNOSIS — I2510 Atherosclerotic heart disease of native coronary artery without angina pectoris: Secondary | ICD-10-CM | POA: Diagnosis not present

## 2023-11-17 DIAGNOSIS — I700 Atherosclerosis of aorta: Secondary | ICD-10-CM

## 2023-11-17 DIAGNOSIS — E785 Hyperlipidemia, unspecified: Secondary | ICD-10-CM

## 2023-11-17 DIAGNOSIS — I428 Other cardiomyopathies: Secondary | ICD-10-CM | POA: Diagnosis not present

## 2023-11-17 DIAGNOSIS — I10 Essential (primary) hypertension: Secondary | ICD-10-CM

## 2023-11-17 DIAGNOSIS — I361 Nonrheumatic tricuspid (valve) insufficiency: Secondary | ICD-10-CM | POA: Diagnosis not present

## 2023-11-17 DIAGNOSIS — N186 End stage renal disease: Secondary | ICD-10-CM

## 2023-11-17 NOTE — Patient Instructions (Addendum)
 Medication Instructions:  Your physician recommends that you continue on your current medications as directed. Please refer to the Current Medication list given to you today.  *If you need a refill on your cardiac medications before your next appointment, please call your pharmacy*  Lab Work: None ordered.  You may go to any [facility] Location for your lab work:  [facility] - [address] ([facility]) - [address] [phone] [address]  [place] - [address]   [place]  - [address]   [place] - [address] - [address] [employer] [place]  - [address] [place] - [address] ([facility]   If you have labs (blood work) drawn today and your tests are completely normal, you will receive your results only by: [employer] (if you have MyChart)  If you have any lab test that is abnormal or we need to change your treatment, we will call you or send a MyChart message to review the results.  Testing/Procedures: Your physician has requested that you have an echocardiogram. Echocardiography is a painless test that uses sound waves to create images of your heart. It provides your doctor with information about the size and shape of your heart and how well your heart's chambers and valves are working. This procedure takes approximately one hour. There are no restrictions for this procedure. Please do NOT wear cologne, perfume, aftershave, or lotions (deodorant is allowed). Please arrive 15 minutes prior to your appointment time.  Please note: We ask at that you not bring children with you during ultrasound (echo/ vascular) testing. Due to room size and safety concerns, children are not allowed in the ultrasound rooms during exams. Our front office staff cannot provide observation of children in our lobby area while testing is being conducted. An adult accompanying a patient  to their appointment will only be allowed in the ultrasound room at the discretion of the ultrasound technician under special circumstances. We apologize for any inconvenience.   Follow-Up: At [facility], you and your health needs are our priority.  As part of our continuing mission to provide you with exceptional heart care, we have created designated Provider Care Teams.  These Care Teams include your primary Cardiologist (physician) and Advanced Practice Providers (APPs -  Physician Assistants and Nurse Practitioners) who all work together to provide you with the care you need, when you need it.   Your next appointment:   6 months   Provider: Lurena Red, MD

## 2023-11-22 ENCOUNTER — Ambulatory Visit (HOSPITAL_COMMUNITY): Admitting: Cardiology

## 2023-12-04 ENCOUNTER — Ambulatory Visit: Attending: Physician Assistant

## 2023-12-18 ENCOUNTER — Encounter: Admitting: Dietician

## 2023-12-30 ENCOUNTER — Other Ambulatory Visit: Payer: Self-pay

## 2023-12-30 ENCOUNTER — Emergency Department (HOSPITAL_COMMUNITY)

## 2023-12-30 ENCOUNTER — Encounter (HOSPITAL_COMMUNITY): Payer: Self-pay | Admitting: Emergency Medicine

## 2023-12-30 ENCOUNTER — Inpatient Hospital Stay (HOSPITAL_COMMUNITY): Admission: EM | Admit: 2023-12-30 | Discharge: 2024-01-02 | DRG: 871 | Disposition: A | Discharge status: Home Health

## 2023-12-30 DIAGNOSIS — Z992 Dependence on renal dialysis: Secondary | ICD-10-CM

## 2023-12-30 DIAGNOSIS — Z833 Family history of diabetes mellitus: Secondary | ICD-10-CM

## 2023-12-30 DIAGNOSIS — Z888 Allergy status to other drugs, medicaments and biological substances status: Secondary | ICD-10-CM | POA: Diagnosis not present

## 2023-12-30 DIAGNOSIS — Z1152 Encounter for screening for COVID-19: Secondary | ICD-10-CM | POA: Diagnosis not present

## 2023-12-30 DIAGNOSIS — Z91013 Allergy to seafood: Secondary | ICD-10-CM

## 2023-12-30 DIAGNOSIS — J9611 Chronic respiratory failure with hypoxia: Secondary | ICD-10-CM | POA: Diagnosis present

## 2023-12-30 DIAGNOSIS — J189 Pneumonia, unspecified organism: Secondary | ICD-10-CM | POA: Diagnosis present

## 2023-12-30 DIAGNOSIS — Z91041 Radiographic dye allergy status: Secondary | ICD-10-CM

## 2023-12-30 DIAGNOSIS — R6520 Severe sepsis without septic shock: Secondary | ICD-10-CM | POA: Diagnosis present

## 2023-12-30 DIAGNOSIS — Z883 Allergy status to other anti-infective agents status: Secondary | ICD-10-CM

## 2023-12-30 DIAGNOSIS — N186 End stage renal disease: Secondary | ICD-10-CM | POA: Diagnosis present

## 2023-12-30 DIAGNOSIS — I5023 Acute on chronic systolic (congestive) heart failure: Secondary | ICD-10-CM | POA: Diagnosis present

## 2023-12-30 DIAGNOSIS — Z880 Allergy status to penicillin: Secondary | ICD-10-CM

## 2023-12-30 DIAGNOSIS — N2581 Secondary hyperparathyroidism of renal origin: Secondary | ICD-10-CM | POA: Diagnosis present

## 2023-12-30 DIAGNOSIS — Z9981 Dependence on supplemental oxygen: Secondary | ICD-10-CM

## 2023-12-30 DIAGNOSIS — Z91012 Allergy to eggs, unspecified: Secondary | ICD-10-CM

## 2023-12-30 DIAGNOSIS — I132 Hypertensive heart and chronic kidney disease with heart failure and with stage 5 chronic kidney disease, or end stage renal disease: Secondary | ICD-10-CM | POA: Diagnosis present

## 2023-12-30 DIAGNOSIS — Z91030 Bee allergy status: Secondary | ICD-10-CM | POA: Diagnosis not present

## 2023-12-30 DIAGNOSIS — Z7982 Long term (current) use of aspirin: Secondary | ICD-10-CM | POA: Diagnosis not present

## 2023-12-30 DIAGNOSIS — I2510 Atherosclerotic heart disease of native coronary artery without angina pectoris: Secondary | ICD-10-CM | POA: Diagnosis present

## 2023-12-30 DIAGNOSIS — Z79899 Other long term (current) drug therapy: Secondary | ICD-10-CM

## 2023-12-30 DIAGNOSIS — F419 Anxiety disorder, unspecified: Secondary | ICD-10-CM | POA: Diagnosis present

## 2023-12-30 DIAGNOSIS — R54 Age-related physical debility: Secondary | ICD-10-CM | POA: Diagnosis present

## 2023-12-30 DIAGNOSIS — Z8249 Family history of ischemic heart disease and other diseases of the circulatory system: Secondary | ICD-10-CM | POA: Diagnosis not present

## 2023-12-30 DIAGNOSIS — Z881 Allergy status to other antibiotic agents status: Secondary | ICD-10-CM | POA: Diagnosis not present

## 2023-12-30 DIAGNOSIS — E872 Acidosis, unspecified: Secondary | ICD-10-CM | POA: Diagnosis present

## 2023-12-30 DIAGNOSIS — D631 Anemia in chronic kidney disease: Secondary | ICD-10-CM | POA: Diagnosis present

## 2023-12-30 DIAGNOSIS — A411 Sepsis due to other specified staphylococcus: Principal | ICD-10-CM | POA: Diagnosis present

## 2023-12-30 LAB — BLOOD CULTURE ID PANEL (REFLEXED) - BCID2

## 2023-12-30 LAB — HEPATITIS B SURFACE ANTIGEN: Hepatitis B Surface Ag: NONREACTIVE

## 2023-12-30 LAB — I-STAT CHEM 8, ED
BUN: 38 mg/dL — ABNORMAL HIGH (ref 8–23)
Calcium, Ion: 1.02 mmol/L — ABNORMAL LOW (ref 1.15–1.40)
Chloride: 101 mmol/L (ref 98–111)
Creatinine, Ser: 9 mg/dL — ABNORMAL HIGH (ref 0.44–1.00)
Glucose, Bld: 74 mg/dL (ref 70–99)
HCT: 40 % (ref 36.0–46.0)
Hemoglobin: 13.6 g/dL (ref 12.0–15.0)
Potassium: 3.3 mmol/L — ABNORMAL LOW (ref 3.5–5.1)
Sodium: 140 mmol/L (ref 135–145)
TCO2: 23 mmol/L (ref 22–32)

## 2023-12-30 LAB — CBC WITH DIFFERENTIAL/PLATELET
Abs Immature Granulocytes: 0.09 K/uL — ABNORMAL HIGH (ref 0.00–0.07)
Basophils Absolute: 0 K/uL (ref 0.0–0.1)
Basophils Relative: 1 %
Eosinophils Absolute: 0 K/uL (ref 0.0–0.5)
Eosinophils Relative: 0 %
HCT: 37 % (ref 36.0–46.0)
Hemoglobin: 11.4 g/dL — ABNORMAL LOW (ref 12.0–15.0)
Immature Granulocytes: 1 %
Lymphocytes Relative: 11 %
Lymphs Abs: 1 K/uL (ref 0.7–4.0)
MCH: 30.9 pg (ref 26.0–34.0)
MCHC: 30.8 g/dL (ref 30.0–36.0)
MCV: 100.3 fL — ABNORMAL HIGH (ref 80.0–100.0)
Monocytes Absolute: 0.6 K/uL (ref 0.1–1.0)
Monocytes Relative: 7 %
Neutro Abs: 7.1 K/uL (ref 1.7–7.7)
Neutrophils Relative %: 80 %
Platelets: 140 K/uL — ABNORMAL LOW (ref 150–400)
RBC: 3.69 MIL/uL — ABNORMAL LOW (ref 3.87–5.11)
RDW: 15.9 % — ABNORMAL HIGH (ref 11.5–15.5)
WBC: 8.8 K/uL (ref 4.0–10.5)
nRBC: 0 % (ref 0.0–0.2)

## 2023-12-30 LAB — CBC
HCT: 33.2 % — ABNORMAL LOW (ref 36.0–46.0)
Hemoglobin: 10.1 g/dL — ABNORMAL LOW (ref 12.0–15.0)
MCH: 30.9 pg (ref 26.0–34.0)
MCHC: 30.4 g/dL (ref 30.0–36.0)
MCV: 101.5 fL — ABNORMAL HIGH (ref 80.0–100.0)
Platelets: 136 K/uL — ABNORMAL LOW (ref 150–400)
RBC: 3.27 MIL/uL — ABNORMAL LOW (ref 3.87–5.11)
RDW: 15.9 % — ABNORMAL HIGH (ref 11.5–15.5)
WBC: 9.4 K/uL (ref 4.0–10.5)
nRBC: 0 % (ref 0.0–0.2)

## 2023-12-30 LAB — COMPREHENSIVE METABOLIC PANEL WITH GFR
ALT: 13 U/L (ref 0–44)
AST: 41 U/L (ref 15–41)
Albumin: 3.7 g/dL (ref 3.5–5.0)
Alkaline Phosphatase: 54 U/L (ref 38–126)
Anion gap: 23 — ABNORMAL HIGH (ref 5–15)
BUN: 39 mg/dL — ABNORMAL HIGH (ref 8–23)
CO2: 21 mmol/L — ABNORMAL LOW (ref 22–32)
Calcium: 9.9 mg/dL (ref 8.9–10.3)
Chloride: 96 mmol/L — ABNORMAL LOW (ref 98–111)
Creatinine, Ser: 8.69 mg/dL — ABNORMAL HIGH (ref 0.44–1.00)
GFR, Estimated: 5 mL/min — ABNORMAL LOW
Glucose, Bld: 79 mg/dL (ref 70–99)
Potassium: 3.5 mmol/L (ref 3.5–5.1)
Sodium: 140 mmol/L (ref 135–145)
Total Bilirubin: 0.9 mg/dL (ref 0.0–1.2)
Total Protein: 8.9 g/dL — ABNORMAL HIGH (ref 6.5–8.1)

## 2023-12-30 LAB — I-STAT CG4 LACTIC ACID, ED
Lactic Acid, Venous: 7 mmol/L (ref 0.5–1.9)
Lactic Acid, Venous: 6.7 mmol/L (ref 0.5–1.9)

## 2023-12-30 LAB — RESP PANEL BY RT-PCR (RSV, FLU A&B, COVID)  RVPGX2
Influenza A by PCR: NEGATIVE
Influenza B by PCR: NEGATIVE
Resp Syncytial Virus by PCR: NEGATIVE
SARS Coronavirus 2 by RT PCR: NEGATIVE

## 2023-12-30 LAB — TROPONIN T, HIGH SENSITIVITY
Troponin T High Sensitivity: 124 ng/L (ref 0–19)
Troponin T High Sensitivity: 120 ng/L (ref 0–19)

## 2023-12-30 LAB — CREATININE, SERUM
Creatinine, Ser: 9.18 mg/dL — ABNORMAL HIGH (ref 0.44–1.00)
GFR, Estimated: 4 mL/min — ABNORMAL LOW

## 2023-12-30 LAB — HIV ANTIBODY (ROUTINE TESTING W REFLEX): HIV Screen 4th Generation wRfx: NONREACTIVE

## 2023-12-30 LAB — PRO BRAIN NATRIURETIC PEPTIDE: Pro Brain Natriuretic Peptide: >35000 pg/mL — ABNORMAL HIGH

## 2023-12-30 MED ORDER — SODIUM CHLORIDE 0.9 % IV SOLN
1.0000 g | INTRAVENOUS | Status: DC
  Administered 2023-12-31 – 2024-01-01 (×2): 1 g via INTRAVENOUS
  Filled 2023-12-30: qty 10

## 2023-12-30 MED ORDER — RENA-VITE PO TABS
1.0000 | ORAL_TABLET | Freq: Every day | ORAL | Status: DC
  Administered 2023-12-30 – 2024-01-02 (×4): 1 via ORAL
  Filled 2023-12-30 (×3): qty 1

## 2023-12-30 MED ORDER — LOSARTAN POTASSIUM 25 MG PO TABS
25.0000 mg | ORAL_TABLET | Freq: Every day | ORAL | Status: DC
  Filled 2023-12-30 (×2): qty 1

## 2023-12-30 MED ORDER — GUAIFENESIN-DM 100-10 MG/5ML PO SYRP: 10.0000 mL | ORAL_SOLUTION | ORAL | Status: DC | PRN

## 2023-12-30 MED ORDER — ONDANSETRON HCL 4 MG PO TABS: 4.0000 mg | ORAL_TABLET | Freq: Four times a day (QID) | ORAL | Status: DC | PRN

## 2023-12-30 MED ORDER — DOXYCYCLINE HYCLATE 100 MG PO TABS
100.0000 mg | ORAL_TABLET | Freq: Once | ORAL | Status: AC
  Administered 2023-12-30: 100 mg via ORAL
  Filled 2023-12-30: qty 1

## 2023-12-30 MED ORDER — SODIUM CHLORIDE 0.9 % IV SOLN
1.0000 g | Freq: Once | INTRAVENOUS | Status: AC
  Administered 2023-12-30: 1 g via INTRAVENOUS
  Filled 2023-12-30: qty 10

## 2023-12-30 MED ORDER — ASPIRIN 81 MG PO TBEC
81.0000 mg | DELAYED_RELEASE_TABLET | Freq: Every day | ORAL | Status: DC
  Administered 2023-12-30 – 2024-01-02 (×4): 81 mg via ORAL
  Filled 2023-12-30 (×2): qty 1

## 2023-12-30 MED ORDER — ONDANSETRON HCL 4 MG/2ML IJ SOLN: 4.0000 mg | Freq: Four times a day (QID) | INTRAMUSCULAR | Status: DC | PRN

## 2023-12-30 MED ORDER — LORAZEPAM 0.5 MG PO TABS: 0.5000 mg | ORAL_TABLET | Freq: Two times a day (BID) | ORAL | Status: DC | PRN

## 2023-12-30 MED ORDER — METOPROLOL SUCCINATE ER 25 MG PO TB24
25.0000 mg | ORAL_TABLET | Freq: Every day | ORAL | Status: DC
  Administered 2023-12-30 – 2024-01-02 (×4): 25 mg via ORAL
  Filled 2023-12-30 (×2): qty 1

## 2023-12-30 MED ORDER — ACETAMINOPHEN 650 MG RE SUPP: 650.0000 mg | Freq: Four times a day (QID) | RECTAL | Status: DC | PRN

## 2023-12-30 MED ORDER — HEPARIN SODIUM (PORCINE) 5000 UNIT/ML IJ SOLN
5000.0000 [IU] | Freq: Three times a day (TID) | INTRAMUSCULAR | Status: DC
  Administered 2023-12-30 – 2024-01-01 (×6): 5000 [IU] via SUBCUTANEOUS
  Filled 2023-12-30 (×5): qty 1

## 2023-12-30 MED ORDER — DOXYCYCLINE HYCLATE 100 MG PO TABS
100.0000 mg | ORAL_TABLET | Freq: Two times a day (BID) | ORAL | Status: DC
  Administered 2023-12-30 – 2023-12-31 (×3): 100 mg via ORAL
  Filled 2023-12-30 (×3): qty 1

## 2023-12-30 MED ORDER — IPRATROPIUM-ALBUTEROL 0.5-2.5 (3) MG/3ML IN SOLN
3.0000 mL | Freq: Four times a day (QID) | RESPIRATORY_TRACT | Status: DC | PRN
  Administered 2023-12-31: 3 mL via RESPIRATORY_TRACT
  Filled 2023-12-30: qty 3

## 2023-12-30 MED ORDER — ACETAMINOPHEN 325 MG PO TABS
650.0000 mg | ORAL_TABLET | Freq: Four times a day (QID) | ORAL | Status: DC | PRN
  Administered 2023-12-31: 650 mg via ORAL
  Filled 2023-12-30: qty 2

## 2023-12-30 NOTE — ED Triage Notes (Signed)
 Pt feeling unwell for a week with cough and fevers. Dialysis patient with last treatment Wedns. 2 [place] given by EMS for rhonchi and wheezing. Pt from home.

## 2023-12-30 NOTE — ED Notes (Signed)
 Report given to [facility] - Hemodialysis.

## 2023-12-30 NOTE — Procedures (Signed)
 HD Note:  Some information was entered later than the data was gathered due to patient care needs. The stated time with the data is accurate.  Received patient in bed to unit. She was short of breath and on 5 l/m of O2  Alert and oriented.   Informed consent signed and in chart.   Access used: Left upper arm fistula Access issues: Venous line failed to continue treatment at the 2 hour and 23 minute mark of the treatment.  Patient refused to be stuck again.  Treatment ended.  Ronnald Acosta, PA notified.  At the beginning of treatment she was weighed on the scale. She stated that she was only .5 kilo away from her dry weight. She stated should only wanted 1000 ml UF. The venous line started to have increase pressures and then stopped working. Patient refused to be stuck again.   Post treatment, she is back to her baseline of 2 l/m of O2. She continues to have a congested cough.    TX duration: 2 hours and 23 min  Alert, without acute distress.  Total UF removed: 900 ml  Hand-off given to patient's nurse.   Transported back to the room   Recardo Linn L. Lenon, RN Kid[facility] Dialysis Unit.

## 2023-12-30 NOTE — Consult Note (Signed)
 [facility] Renal Consultation Note    Indication for Consultation:  Management of ESRD/hemodialysis; anemia, hypertension/volume and secondary hyperparathyroidism   HPI: Barbara Crawford is a 68 y.o. female with ESRD on HD TTS, HTN, CAD, HFrEF, chronic respiratory failure on 2L [place] who is admitted with pneumonia. Patient reports productive cough and fevers for past few days. Tachycardia and tachypnea on arrival. CXR showing nodular opacity within the left upper lung zone, possibly infectious or inflammatory. Flu/Covid panel negative.   Labs  Na 140, K 3.5, bicarb 21, BUN 39, WBC 8.8, Hgb 11.4. Lactic acid 7.0. ProBNP >35K, Troponin 124>120. Antibiotics started. Blood cultures collected.   Seen and examined in the ED. Endorses weakness, dyspnea. She had some loose stools at home but better now. Denies chest pain, nausea/vomiting.   Dialysis TTS at [facility]. Has L arm AVG. Last dialysis 12/24 due to holiday schedule. States she overdid it during Christmas. Feels like she has excess fluid on. Will have dialysis today.  Past Medical History:  Diagnosis Date   Anxiety    ESRD (end stage renal disease) on dialysis (HCC)    Heart failure with mildly reduced ejection fraction (HFmrEF) (HCC)    Hypertension    Past Surgical History:  Procedure Laterality Date   AV FISTULA PLACEMENT Left 03/13/2020   Procedure: Creation of Left Brachiocephalic fistula;  Surgeon: Gretta Lonni PARAS, MD;  Location: [facility] OR;  Service: Vascular;  Laterality: Left;   IR FLUORO GUIDE CV LINE RIGHT  02/26/2020   IR FLUORO GUIDE CV LINE RIGHT  03/09/2020   IR US  GUIDE VASC ACCESS RIGHT  02/26/2020   IR US  GUIDE VASC ACCESS RIGHT  03/09/2020   RIGHT HEART CATH N/A 05/12/2023   Procedure: RIGHT HEART CATH;  Surgeon: Wendel Lurena POUR, MD;  Location: [facility] INVASIVE CV LAB;  Service: Cardiovascular;  Laterality: N/A;   RIGHT/LEFT HEART CATH AND CORONARY ANGIOGRAPHY N/A 10/07/2021   Procedure: RIGHT/LEFT HEART CATH  AND CORONARY ANGIOGRAPHY;  Surgeon: Jordan, Peter M, MD;  Location: [facility] INVASIVE CV LAB;  Service: Cardiovascular;  Laterality: N/A;   Family History  Problem Relation Age of Onset   Dementia Mother    Diabetes Sister    Hypertension Son    Social History:  reports that she has never smoked. She has never used smokeless tobacco. She reports that she does not currently use alcohol. She reports that she does not currently use drugs after having used the following drugs: Marijuana. Allergies[1] Prior to Admission medications  Medication Sig Start Date End Date Taking? Authorizing Provider  acetaminophen  (TYLENOL ) 500 MG tablet Take 500 mg by mouth as needed for mild pain, moderate pain or fever.    [provider]  aspirin  EC 81 MG tablet Take 81 mg by mouth daily.    [provider]  ferric citrate (AURYXIA) 1 GM 210 MG(Fe) tablet Take 210-420 mg by mouth See admin instructions. 420 mg with meals, 210 mg with snacks    [provider]  LORazepam  (ATIVAN ) 0.5 MG tablet Take 0.5 mg by mouth 2 (two) times daily as needed for anxiety. 10/18/21   [provider]  losartan  (COZAAR ) 25 MG tablet Take 1 tablet (25 mg total) by mouth daily. 05/01/23   Thukkani, Arun K, MD  metoprolol  succinate (TOPROL -XL) 25 MG 24 hr tablet Take 1 tablet (25 mg total) by mouth daily. 11/07/23   Thukkani, Arun K, MD  multivitamin (RENA-VIT) TABS tablet Take 1 tablet by mouth at bedtime.  [provider]  OXYGEN  Inhale 2 L/min into the lungs continuous.    [provider]   Current Facility-Administered Medications  Medication Dose Route Frequency Provider Last Rate Last Admin   aspirin  EC tablet 81 mg  81 mg Oral Daily Georgina Basket, MD   81 mg at 12/30/23 1033   [START ON 12/31/2023] cefTRIAXone  (ROCEPHIN ) 1 g in sodium chloride  0.9 % 100 mL IVPB  1 g Intravenous Q24H Moore, Willie, MD       doxycycline  (VIBRA -TABS) tablet 100 mg  100 mg Oral Q12H Georgina Basket, MD        heparin  injection 5,000 Units  5,000 Units Subcutaneous Q8H Georgina Basket, MD   5,000 Units at 12/30/23 1033   ipratropium-albuterol  (DUONEB) 0.5-2.5 (3) MG/3ML nebulizer solution 3 mL  3 mL Nebulization Q6H PRN Georgina Basket, MD       LORazepam  (ATIVAN ) tablet 0.5 mg  0.5 mg Oral BID PRN Georgina Basket, MD       losartan  (COZAAR ) tablet 25 mg  25 mg Oral Daily Georgina Basket, MD       metoprolol  succinate (TOPROL -XL) 24 hr tablet 25 mg  25 mg Oral Daily Georgina Basket, MD   25 mg at 12/30/23 1033   multivitamin (RENA-VIT) tablet 1 tablet  1 tablet Oral QHS Georgina Basket, MD       Current Outpatient Medications  Medication Sig Dispense Refill   acetaminophen  (TYLENOL ) 500 MG tablet Take 500 mg by mouth as needed for mild pain, moderate pain or fever.     aspirin  EC 81 MG tablet Take 81 mg by mouth daily.     ferric citrate (AURYXIA) 1 GM 210 MG(Fe) tablet Take 210-420 mg by mouth See admin instructions. 420 mg with meals, 210 mg with snacks     LORazepam  (ATIVAN ) 0.5 MG tablet Take 0.5 mg by mouth 2 (two) times daily as needed for anxiety.     losartan  (COZAAR ) 25 MG tablet Take 1 tablet (25 mg total) by mouth daily. 90 tablet 3   metoprolol  succinate (TOPROL -XL) 25 MG 24 hr tablet Take 1 tablet (25 mg total) by mouth daily. 90 tablet 1   multivitamin (RENA-VIT) TABS tablet Take 1 tablet by mouth at bedtime.     OXYGEN  Inhale 2 L/min into the lungs continuous.      ROS: As per HPI otherwise negative.  Physical Exam: Vitals:   12/30/23 0900 12/30/23 0945 12/30/23 1030 12/30/23 1046  BP: 130/67 137/71 (!) 150/77   Pulse: (!) 101 (!) 103 (!) 106   Resp: 16  18   Temp:    99.7 F (37.6 C)  TempSrc:    Oral  SpO2: 100% 100% 100%      General: Alert, on nasal oxygen , nad  Head: NCAT sclera not icteric CV: Tachy, regular  Pulm: coarse rhonchi, wheeze throughout  Abdomen: non-tender, no masses  Lower extremities: 1+ edema bilaterally  Neuro: A & O X 3. Moves all extremities  spontaneously. Psych:  Normal affect Dialysis Access: LUE AVG +bruit   Labs: Basic Metabolic Panel: Recent Labs  Lab 12/30/23 0242 12/30/23 0257 12/30/23 0953  NA 140 140  --   K 3.5 3.3*  --   CL 96* 101  --   CO2 21*  --   --   GLUCOSE 79 74  --   BUN 39* 38*  --   CREATININE 8.69* 9.00* 9.18*  CALCIUM  9.9  --   --    Liver Function Tests:  Recent Labs  Lab 12/30/23 0242  AST 41  ALT 13  ALKPHOS 54  BILITOT 0.9  PROT 8.9*  ALBUMIN  3.7   No results for input(s): LIPASE, AMYLASE in the last 168 hours. No results for input(s): AMMONIA in the last 168 hours. CBC: Recent Labs  Lab 12/30/23 0242 12/30/23 0257 12/30/23 0953  WBC 8.8  --  9.4  NEUTROABS 7.1  --   --   HGB 11.4* 13.6 10.1*  HCT 37.0 40.0 33.2*  MCV 100.3*  --  101.5*  PLT 140*  --  136*   Cardiac Enzymes: No results for input(s): CKTOTAL, CKMB, CKMBINDEX, TROPONINI in the last 168 hours. CBG: No results for input(s): GLUCAP in the last 168 hours. Iron Studies: No results for input(s): IRON, TIBC, TRANSFERRIN, FERRITIN in the last 72 hours. Studies/Results: DG Chest Port 1 View Result Date: 12/30/2023 EXAM: 1 VIEW(S) XRAY OF THE CHEST 12/30/2023 02:57:43 AM COMPARISON: None available. CLINICAL HISTORY: sob, fever FINDINGS: LUNGS AND PLEURA: Nodular opacity within the left upper lung zone appears new from prior examination and may be infectious or inflammatory in the acute setting. Follow-up chest radiograph is recommended in 3-4 weeks, following conservative therapy, to document resolution. If persistent at that time, dedicated contrast enhanced CT imaging of the chest is recommended for further evaluation. No pleural effusion. No pneumothorax. HEART AND MEDIASTINUM: Cardiomegaly. Aortic arch calcifications. BONES AND SOFT TISSUES: No acute osseous abnormality. IMPRESSION: 1. Nodular opacity within the left upper lung zone, possibly infectious or inflammatory; follow-up chest  radiograph is recommended in 3-4 weeks, following conservative therapy, to document resolution, and if persistent at that time, dedicated contrast enhanced CT imaging of the chest is recommended for further evaluation. 2. Cardiomegaly and aortic arch calcifications. Electronically signed by: Dorethia Molt MD 12/30/2023 03:29 AM EST RP Workstation: [id number]    Dialysis Orders:  [facility] TTS EDW 55.5kg per patient   Assessment/Plan: Sepsis 2/2 PNA.  Antibiotics per primary team Blood cultures pending.  ESRD.  HD TTS. Continue per schedule. HD today  Access. L arm AVG  Hypertension.  BP elevated. Resume home meds. Should improve with UF as well  HFrEF. Optimize volume with HD  Anemia. Hgb 9-11. Follow trends.  Metabolic bone disease.  Ca ok. Check phos with HD.  Nutrition. Renal diet with fluid restriction Chronic resp failure. 2L [place] at baseline.   Maisie Ronnald Acosta PA-C [facility] 12/30/2023, 11:18 AM        [1]  Allergies Allergen Reactions   Bee Venom Anaphylaxis   Shellfish Protein-Containing Drug Products Anaphylaxis, Swelling and Other (See Comments)    Sweat and Dizzy. Lobster    Azithromycin  Hives    hives   Egg Solids, Whole     Other Reaction(s): GI Intolerance   Bidil  [Isosorb Dinitrate-Hydralazine ]     headache   Chlorhexidine  Rash   Egg Protein-Containing Drug Products Nausea Only   Erythromycin Nausea Only and Other (See Comments)    Cramps    Hydralazine      headache   Iodine Rash and Other (See Comments)    Arm flares up per pt   Penicillins Rash    Has patient had a PCN reaction causing immediate rash, facial/tongue/throat swelling, SOB or lightheadedness with hypotension: no  Has patient had a PCN reaction causing severe rash involving mucus membranes or skin necrosis: yes  Has patient had a PCN reaction that required hospitalization: no  Has patient had a PCN reaction occurring within the last 10  years: no  If all of the above  answers are NO, then may proceed with Cephalosporin use.  Other Reaction(s): GI Intolerance

## 2023-12-30 NOTE — ED Notes (Signed)
 This phlebotomist attempted to collect second set of cultures. Unable to collect due to poor vascularity. RN aware

## 2023-12-30 NOTE — ED Provider Notes (Signed)
 " [place] EMERGENCY DEPARTMENT AT [facility] Provider Note   CSN: [id number] Arrival date & time: 12/30/23  9794     Patient presents with: Cough   Barbara Crawford is a 68 y.o. female.   The history is provided by the patient, the EMS personnel and medical records.  Cough Barbara Crawford is a 68 y.o. female who presents to the Emergency Department complaining of shortness of breath.  She presents to the emergency department by EMS for evaluation of difficulty breathing, fever to 101, cough, diarrhea.  She has ESRD and dialysis Monday, Wednesday, Friday.  Her last dialysis session was on Wednesday and she did receive a full session at that time.  She felt okay on Tuesday but did feel somewhat unwell on Wednesday.  Fever started on Thursday.  She is not having any vomiting or hematochezia.  No hemoptysis.  No chest pain.  She takes aspirin .  She has a history of ESRD, CHF.  She is oxygen  dependent on 2 L at baseline.     Prior to Admission medications  Medication Sig Start Date End Date Taking? Authorizing Provider  acetaminophen  (TYLENOL ) 500 MG tablet Take 500 mg by mouth as needed for mild pain, moderate pain or fever.    [provider]  aspirin  EC 81 MG tablet Take 81 mg by mouth daily.    [provider]  ferric citrate (AURYXIA) 1 GM 210 MG(Fe) tablet Take 210-420 mg by mouth See admin instructions. 420 mg with meals, 210 mg with snacks    [provider]  LORazepam  (ATIVAN ) 0.5 MG tablet Take 0.5 mg by mouth 2 (two) times daily as needed for anxiety. 10/18/21   [provider]  losartan  (COZAAR ) 25 MG tablet Take 1 tablet (25 mg total) by mouth daily. 05/01/23   Thukkani, Arun K, MD  metoprolol  succinate (TOPROL -XL) 25 MG 24 hr tablet Take 1 tablet (25 mg total) by mouth daily. 11/07/23   Thukkani, Arun K, MD  multivitamin (RENA-VIT) TABS tablet Take 1 tablet by mouth at bedtime.    [provider]  OXYGEN  Inhale 2  L/min into the lungs continuous.    [provider]    Allergies: Bee venom; Shellfish protein-containing drug products; Azithromycin ; Egg solids, whole; Bidil  [isosorb dinitrate-hydralazine ]; Chlorhexidine ; Egg protein-containing drug products; Erythromycin; Hydralazine ; Iodine; and Penicillins    Review of Systems  Respiratory:  Positive for cough.   All other systems reviewed and are negative.   Updated Vital Signs BP 134/72   Pulse (!) 104   Temp 99.2 F (37.3 C) (Oral)   Resp 18   SpO2 100%   Physical Exam Vitals and nursing note reviewed.  Constitutional:      Appearance: She is well-developed.  HENT:     Head: Normocephalic and atraumatic.  Cardiovascular:     Rate and Rhythm: Regular rhythm. Tachycardia present.     Heart sounds: Murmur heard.  Pulmonary:     Comments: Tachypnea, crackles in left lung base Abdominal:     Palpations: Abdomen is soft.     Tenderness: There is no abdominal tenderness. There is no guarding or rebound.  Musculoskeletal:        General: No tenderness.     Comments: Nonpitting edema to BLE. Fistula in LUE  Skin:    General: Skin is warm and dry.  Neurological:     Mental Status: She is alert and oriented to person, place, and time.  Psychiatric:  Behavior: Behavior normal.     (all labs ordered are listed, but only abnormal results are displayed) Labs Reviewed  COMPREHENSIVE METABOLIC PANEL WITH GFR - Abnormal; Notable for the following components:      Result Value   Chloride 96 (*)    CO2 21 (*)    BUN 39 (*)    Creatinine, Ser 8.69 (*)    Total Protein 8.9 (*)    GFR, Estimated 5 (*)    Anion gap 23 (*)    All other components within normal limits  CBC WITH DIFFERENTIAL/PLATELET - Abnormal; Notable for the following components:   RBC 3.69 (*)    Hemoglobin 11.4 (*)    MCV 100.3 (*)    RDW 15.9 (*)    Platelets 140 (*)    Abs Immature Granulocytes 0.09 (*)    All other components within normal limits   PRO BRAIN NATRIURETIC PEPTIDE - Abnormal; Notable for the following components:   Pro Brain Natriuretic Peptide >35,000.0 (*)    All other components within normal limits  I-STAT CG4 LACTIC ACID, ED - Abnormal; Notable for the following components:   Lactic Acid, Venous 7.0 (*)    All other components within normal limits  I-STAT CHEM 8, ED - Abnormal; Notable for the following components:   Potassium 3.3 (*)    BUN 38 (*)    Creatinine, Ser 9.00 (*)    Calcium , Ion 1.02 (*)    All other components within normal limits  I-STAT CG4 LACTIC ACID, ED - Abnormal; Notable for the following components:   Lactic Acid, Venous 6.7 (*)    All other components within normal limits  TROPONIN T, HIGH SENSITIVITY - Abnormal; Notable for the following components:   Troponin T High Sensitivity 124 (*)    All other components within normal limits  TROPONIN T, HIGH SENSITIVITY - Abnormal; Notable for the following components:   Troponin T High Sensitivity 120 (*)    All other components within normal limits  RESP PANEL BY RT-PCR (RSV, FLU A&B, COVID)  RVPGX2  CULTURE, BLOOD (ROUTINE X 2)  CULTURE, BLOOD (ROUTINE X 2)  HIV ANTIBODY (ROUTINE TESTING W REFLEX)  CBC  CREATININE, SERUM    EKG: EKG Interpretation Date/Time:  Saturday December 30 2023 02:19:27 EST Ventricular Rate:  123 PR Interval:  135 QRS Duration:  97 QT Interval:  311 QTC Calculation: 445 R Axis:   -66  Text Interpretation: Sinus tachycardia Ventricular premature complex Left atrial enlargement Left ventricular hypertrophy Inferior infarct, old Anterior infarct, old Lateral leads are also involved Artifact in lead(s) I III aVR aVL V2 Confirmed by Griselda Norris [phone]) on 12/30/2023 2:36:01 AM  Radiology: [facility] Chest Port 1 View Result Date: 12/30/2023 EXAM: 1 VIEW(S) XRAY OF THE CHEST 12/30/2023 02:57:43 AM COMPARISON: None available. CLINICAL HISTORY: sob, fever FINDINGS: LUNGS AND PLEURA: Nodular opacity within the left  upper lung zone appears new from prior examination and may be infectious or inflammatory in the acute setting. Follow-up chest radiograph is recommended in 3-4 weeks, following conservative therapy, to document resolution. If persistent at that time, dedicated contrast enhanced CT imaging of the chest is recommended for further evaluation. No pleural effusion. No pneumothorax. HEART AND MEDIASTINUM: Cardiomegaly. Aortic arch calcifications. BONES AND SOFT TISSUES: No acute osseous abnormality. IMPRESSION: 1. Nodular opacity within the left upper lung zone, possibly infectious or inflammatory; follow-up chest radiograph is recommended in 3-4 weeks, following conservative therapy, to document resolution, and if persistent at that time, dedicated  contrast enhanced CT imaging of the chest is recommended for further evaluation. 2. Cardiomegaly and aortic arch calcifications. Electronically signed by: Dorethia Molt MD 12/30/2023 03:29 AM EST RP Workstation: [id number]     Procedures   Medications Ordered in the ED  cefTRIAXone  (ROCEPHIN ) 1 g in sodium chloride  0.9 % 100 mL IVPB (has no administration in time range)  doxycycline  (VIBRA -TABS) tablet 100 mg (has no administration in time range)  heparin  injection 5,000 Units (has no administration in time range)  aspirin  EC tablet 81 mg (has no administration in time range)  LORazepam  (ATIVAN ) tablet 0.5 mg (has no administration in time range)  losartan  (COZAAR ) tablet 25 mg (has no administration in time range)  metoprolol  succinate (TOPROL -XL) 24 hr tablet 25 mg (has no administration in time range)  multivitamin (RENA-VIT) tablet 1 tablet (has no administration in time range)  cefTRIAXone  (ROCEPHIN ) 1 g in sodium chloride  0.9 % 100 mL IVPB (0 g Intravenous Stopped 12/30/23 0442)  doxycycline  (VIBRA -TABS) tablet 100 mg (100 mg Oral Given 12/30/23 0412)                                    Medical Decision Making Amount and/or Complexity of Data  Reviewed Labs: ordered. Radiology: ordered.  Risk Prescription drug management. Decision regarding hospitalization.  Patient was ESRD on hemodialysis here for evaluation of fever, cough, diarrhea.  She is tachycardic with crackles to the left lung base, tachypnea.  Work of breathing is within normal limits.  Chest x-ray with possible pneumonia, will start on antibiotics.  She is negative for COVID, flu.  Renal function is stable.  Lactic acid is elevated, patient is euvolemic to hypervolemic on evaluation, blood pressures are elevated.  But not treated with pressors or IV fluids at this time.  Unclear source of her elevated lactic acid.  BNP and troponin are elevated, she does not have any active chest pain.  Do suspect some of this is secondary to renal clearance.  Discussed with patient recommendation for admission for dialysis and given her volume overload state as well as acute infectious process.  Medicine consulted for admission.  Discussed with Dr. Dolan with nephrology, nephrology service will see the patient.     Final diagnoses:  None    ED Discharge Orders     None          Griselda Norris, MD 12/30/23 [phone]  "

## 2023-12-30 NOTE — ED Notes (Signed)
 Georgina, MD notified of patient temperature. No PRNs at this time.

## 2023-12-30 NOTE — H&P (Signed)
 " History and Physical    Patient: Barbara Crawford FMW:[id number] DOB: 05-02-1955 DOA: 12/30/2023 DOS: the patient was seen and examined on 12/30/2023 PCP: Tammy Tari DASEN, PA-C  Patient coming from: Home  Chief Complaint:  Chief Complaint  Patient presents with   Cough   HPI: Barbara Crawford is a 67 y.o. 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  The patient reported experiencing shortness of breath since Monday, with a hard cough starting on Sunday. The cough was non-productive and persistent. The patient attended all scheduled dialysis sessions, with no sessions shortened. By Wednesday (her birthday), the patient felt unwell after having dinner at her son's house. The patient expressed having no energy, no appetite, feeling feverish, and experiencing continued shortness of breath; as such, she reported to the ED after her symptoms failed to improve.  In the ED, pt hypertensive, tachycardic, and tachypneic with baseline 2L [place]. Labs notable for K 3.3, BUN/Cr 38/9, pro-BNP >35,000, troponin 124-->120, and lactic acid 7-->6.7. CXR showed nodular opacity within the left upper lung zone, possibly infectious or Inflammatory. EDP started IV CTX/doxycycline  (given azithromycin  allergy), requested renal consult for urgent dialysis, and medicine admission  . Review of Systems: As mentioned in the history of present illness. All other systems reviewed and are negative. Past Medical History:  Diagnosis Date   Anxiety    ESRD (end stage renal disease) on dialysis (HCC)    Heart failure with mildly reduced ejection fraction (HFmrEF) (HCC)    Hypertension    Past Surgical History:  Procedure Laterality Date   AV FISTULA PLACEMENT Left 03/13/2020   Procedure: Creation of Left Brachiocephalic  fistula;  Surgeon: Gretta Lonni PARAS, MD;  Location: [facility] OR;  Service: Vascular;  Laterality: Left;   IR FLUORO GUIDE CV LINE RIGHT  02/26/2020   IR FLUORO GUIDE CV LINE RIGHT  03/09/2020   IR US  GUIDE VASC ACCESS RIGHT  02/26/2020   IR US  GUIDE VASC ACCESS RIGHT  03/09/2020   RIGHT HEART CATH N/A 05/12/2023   Procedure: RIGHT HEART CATH;  Surgeon: Wendel Lurena POUR, MD;  Location: [facility] INVASIVE CV LAB;  Service: Cardiovascular;  Laterality: N/A;   RIGHT/LEFT HEART CATH AND CORONARY ANGIOGRAPHY N/A 10/07/2021   Procedure: RIGHT/LEFT HEART CATH AND CORONARY ANGIOGRAPHY;  Surgeon: Jordan, Peter M, MD;  Location: [facility] INVASIVE CV LAB;  Service: Cardiovascular;  Laterality: N/A;   Social History:  reports that she has never smoked. She has never used smokeless tobacco. She reports that she does not currently use alcohol. She reports that she does not currently use drugs after having used the following drugs: Marijuana.  Allergies[1]  Family History  Problem Relation Age of Onset   Dementia Mother    Diabetes Sister    Hypertension Son     Prior to Admission medications  Medication Sig Start Date End Date Taking? Authorizing Provider  acetaminophen  (TYLENOL ) 500 MG tablet Take 500 mg by mouth as needed for mild pain, moderate pain or fever.    [provider]  aspirin  EC 81 MG tablet Take 81 mg by mouth daily.    [provider]  ferric citrate (AURYXIA) 1 GM 210 MG(Fe) tablet Take 210-420 mg by mouth See admin instructions. 420 mg with meals, 210 mg with snacks  [provider]  LORazepam  (ATIVAN ) 0.5 MG tablet Take 0.5 mg by mouth 2 (two) times daily as needed for anxiety. 10/18/21   [provider]  losartan  (COZAAR ) 25 MG tablet Take 1 tablet (25 mg total) by mouth daily. 05/01/23   Thukkani, Arun K, MD  metoprolol  succinate (TOPROL -XL) 25 MG 24 hr tablet Take 1 tablet (25 mg total) by mouth daily. 11/07/23   Thukkani, Arun K, MD  multivitamin (RENA-VIT) TABS  tablet Take 1 tablet by mouth at bedtime.    [provider]  OXYGEN  Inhale 2 L/min into the lungs continuous.    [provider]    Physical Exam: Vitals:   12/30/23 0445 12/30/23 0600 12/30/23 0700 12/30/23 0701  BP: (!) 148/72 (!) 145/69 134/72   Pulse: (!) 121 (!) 109 (!) 104   Resp: 17 18    Temp:    99.2 F (37.3 C)  TempSrc:    Oral  SpO2: 100% 100% 100%    General: Alert, oriented x3, resting comfortably in no acute distress Respiratory: Bibasilar rales and rhonchi Cardiovascular: Regular rate and rhythm w/o m/r/g Abdomen: Soft, nontender, nondistended. Positive bowel sounds   Data Reviewed:  Lab Results  Component Value Date   WBC 8.8 12/30/2023   HGB 13.6 12/30/2023   HCT 40.0 12/30/2023   MCV 100.3 (H) 12/30/2023   PLT 140 (L) 12/30/2023   Lab Results  Component Value Date   GLUCOSE 74 12/30/2023   CALCIUM  9.9 12/30/2023   NA 140 12/30/2023   K 3.3 (L) 12/30/2023   CO2 21 (L) 12/30/2023   CL 101 12/30/2023   BUN 38 (H) 12/30/2023   CREATININE 9.00 (H) 12/30/2023   Lab Results  Component Value Date   ALT 13 12/30/2023   AST 41 12/30/2023   ALKPHOS 54 12/30/2023   BILITOT 0.9 12/30/2023   Lab Results  Component Value Date   INR 1.1 10/27/2023   INR 1.2 03/05/2020   INR 1.0 02/26/2020   Radiology: [facility] Chest Port 1 View Result Date: 12/30/2023 EXAM: 1 VIEW(S) XRAY OF THE CHEST 12/30/2023 02:57:43 AM COMPARISON: None available. CLINICAL HISTORY: sob, fever FINDINGS: LUNGS AND PLEURA: Nodular opacity within the left upper lung zone appears new from prior examination and may be infectious or inflammatory in the acute setting. Follow-up chest radiograph is recommended in 3-4 weeks, following conservative therapy, to document resolution. If persistent at that time, dedicated contrast enhanced CT imaging of the chest is recommended for further evaluation. No pleural effusion. No pneumothorax. HEART AND MEDIASTINUM: Cardiomegaly. Aortic arch  calcifications. BONES AND SOFT TISSUES: No acute osseous abnormality. IMPRESSION: 1. Nodular opacity within the left upper lung zone, possibly infectious or inflammatory; follow-up chest radiograph is recommended in 3-4 weeks, following conservative therapy, to document resolution, and if persistent at that time, dedicated contrast enhanced CT imaging of the chest is recommended for further evaluation. 2. Cardiomegaly and aortic arch calcifications. 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  Sepsis 2/2 LUL CAP As evidenced by lactic acid 7 -IV CTX 1g daily to complete 5 day CAP course -PO doxycycline  100mg  BID to complete 5 day CAP course -Duonebs prn -Wean O2 as tolerated -Ambulatory pulse  ox prior to d/c -F/u blood cultures  Volume overload iso ESRD -Renal consulted for HD; apprec eval/recs -F/u BMP/Mg/Phos daily -Renally dose medications for CrCl -Avoid lovenox, NSAIDs, morphine, Fleet's phosphate enema, regular insulin , contrast; no gadolinium for MRI to avoid nephrogenic systemic fibrosis  HFrEF -RD consulted for low sodium/renal diet teaching; appreciate eval/recs -Dialysis per above -PTA metoprolol  XL 25mg  daily  HTN -PTA losartan  25mg  daily   Advance Care Planning:   Code Status: Full Code   Consults: Renal  Family Communication: Daughter  Severity of Illness: The appropriate patient status for this patient is INPATIENT. Inpatient status is judged to be reasonable and necessary in order to provide the required intensity of service to ensure the patient's safety. The patient's presenting symptoms, physical exam findings, and initial radiographic and laboratory data in the context of their  chronic comorbidities is felt to place them at high risk for further clinical deterioration. Furthermore, it is not anticipated that the patient will be medically stable for discharge from the hospital within 2 midnights of admission.   * I certify that at the point of admission it is my clinical judgment that the patient will require inpatient hospital care spanning beyond 2 midnights from the point of admission due to high intensity of service, high risk for further deterioration and high frequency of surveillance required.*   ------- I spent 55 minutes reviewing previous notes, at the bedside counseling/discussing the treatment plan, and performing clinical documentation.  Author: Marsha Ada, MD 12/30/2023 7:40 AM  For on call review [email or web address].      [1]  Allergies Allergen Reactions   Bee Venom Anaphylaxis   Shellfish Protein-Containing Drug Products Anaphylaxis, Swelling and Other (See Comments)    Sweat and Dizzy. Lobster    Azithromycin  Hives    hives   Egg Solids, Whole     Other Reaction(s): GI Intolerance   Bidil  [Isosorb Dinitrate-Hydralazine ]     headache   Chlorhexidine  Rash   Egg Protein-Containing Drug Products Nausea Only   Erythromycin Nausea Only and Other (See Comments)    Cramps    Hydralazine      headache   Iodine Rash and Other (See Comments)    Arm flares up per pt   Penicillins Rash    Has patient had a PCN reaction causing immediate rash, facial/tongue/throat swelling, SOB or lightheadedness with hypotension: no  Has patient had a PCN reaction causing severe rash involving mucus membranes or skin necrosis: yes  Has patient had a PCN reaction that required hospitalization: no  Has patient had a PCN reaction occurring within the last 10 years: no  If all of the above answers are NO, then may proceed with Cephalosporin use.  Other Reaction(s): GI Intolerance   "

## 2023-12-30 NOTE — ED Notes (Signed)
 Pt transported to dialysis

## 2023-12-30 NOTE — Progress Notes (Signed)
 PHARMACY - PHYSICIAN COMMUNICATION CRITICAL VALUE ALERT - BLOOD CULTURE IDENTIFICATION (BCID)  Barbara Crawford is an 68 y.o. female who presented to [facility] on 12/30/2023 with a chief complaint of cough and fever.   Assessment:  Staph epi 1 of 4 bottles, no resistance, likely contaminant.  Name of physician (or Provider) Contacted: [place]  Current antibiotics: Ceftriaxone , Doxy  Changes to prescribed antibiotics recommended:  Patient is on recommended antibiotics - No changes needed, likely contaminant, and if not it is covered by current abx.  Results for orders placed or performed during the hospital encounter of 12/30/23  Blood Culture ID Panel (Reflexed) (Collected: 12/30/2023  2:42 AM)  Result Value Ref Range   Enterococcus faecalis NOT DETECTED NOT DETECTED   Enterococcus Faecium NOT DETECTED NOT DETECTED   Listeria monocytogenes NOT DETECTED NOT DETECTED   Staphylococcus species DETECTED (A) NOT DETECTED   Staphylococcus aureus (BCID) NOT DETECTED NOT DETECTED   Staphylococcus epidermidis DETECTED (A) NOT DETECTED   Staphylococcus lugdunensis NOT DETECTED NOT DETECTED   Streptococcus species NOT DETECTED NOT DETECTED   Streptococcus agalactiae NOT DETECTED NOT DETECTED   Streptococcus pneumoniae NOT DETECTED NOT DETECTED   Streptococcus pyogenes NOT DETECTED NOT DETECTED   A.calcoaceticus-baumannii NOT DETECTED NOT DETECTED   Bacteroides fragilis NOT DETECTED NOT DETECTED   Enterobacterales NOT DETECTED NOT DETECTED   Enterobacter cloacae complex NOT DETECTED NOT DETECTED   Escherichia coli NOT DETECTED NOT DETECTED   Klebsiella aerogenes NOT DETECTED NOT DETECTED   Klebsiella oxytoca NOT DETECTED NOT DETECTED   Klebsiella pneumoniae NOT DETECTED NOT DETECTED   Proteus species NOT DETECTED NOT DETECTED   Salmonella species NOT DETECTED NOT DETECTED   Serratia marcescens NOT DETECTED NOT DETECTED   Haemophilus influenzae NOT DETECTED NOT DETECTED   Neisseria  meningitidis NOT DETECTED NOT DETECTED   Pseudomonas aeruginosa NOT DETECTED NOT DETECTED   Stenotrophomonas maltophilia NOT DETECTED NOT DETECTED   Candida albicans NOT DETECTED NOT DETECTED   Candida auris NOT DETECTED NOT DETECTED   Candida glabrata NOT DETECTED NOT DETECTED   Candida krusei NOT DETECTED NOT DETECTED   Candida parapsilosis NOT DETECTED NOT DETECTED   Candida tropicalis NOT DETECTED NOT DETECTED   Cryptococcus neoformans/gattii NOT DETECTED NOT DETECTED   Methicillin resistance mecA/C NOT DETECTED NOT DETECTED   Larraine Brazier, PharmD Clinical Pharmacist 12/30/2023  8:14 PM **Pharmacist phone directory can now be found on [email or web address] ([email or web address]).  Listed under [facility].

## 2023-12-30 NOTE — Progress Notes (Signed)
" ° °  Brief Progress Note   _____________________________________________________________________________________________________________  Patient Name: Barbara Crawford Patient DOB: 10-01-1955 Date: @TODAY @      Data: 68 yo female currently awaiting admission to a Telemetry bed at [facility].     Action: Reviewed the patient's vital signs, lab results, and clinical notes to assess if patient appropriate to level-load from [facility] for admission.    Response:  Pt has a history of ESRD and on HD. Patient will remain at [facility], awaiting bed assignment.  _____________________________________________________________________________________________________________  [facility] RN [employer] [address] Please contact us  directly via secure chat (search for [facility]) or by calling us  at [phone] [facility]).  "

## 2023-12-31 DIAGNOSIS — Z992 Dependence on renal dialysis: Secondary | ICD-10-CM

## 2023-12-31 DIAGNOSIS — N186 End stage renal disease: Secondary | ICD-10-CM

## 2023-12-31 LAB — BASIC METABOLIC PANEL WITH GFR
Anion gap: 16 — ABNORMAL HIGH (ref 5–15)
BUN: 33 mg/dL — ABNORMAL HIGH (ref 8–23)
CO2: 24 mmol/L (ref 22–32)
Calcium: 8.7 mg/dL — ABNORMAL LOW (ref 8.9–10.3)
Chloride: 94 mmol/L — ABNORMAL LOW (ref 98–111)
Creatinine, Ser: 6.58 mg/dL — ABNORMAL HIGH (ref 0.44–1.00)
GFR, Estimated: 6 mL/min — ABNORMAL LOW
Glucose, Bld: 58 mg/dL — ABNORMAL LOW (ref 70–99)
Potassium: 4 mmol/L (ref 3.5–5.1)
Sodium: 134 mmol/L — ABNORMAL LOW (ref 135–145)

## 2023-12-31 LAB — CBC
HCT: 33.2 % — ABNORMAL LOW (ref 36.0–46.0)
Hemoglobin: 10.3 g/dL — ABNORMAL LOW (ref 12.0–15.0)
MCH: 29.7 pg (ref 26.0–34.0)
MCHC: 31 g/dL (ref 30.0–36.0)
MCV: 95.7 fL (ref 80.0–100.0)
Platelets: 140 K/uL — ABNORMAL LOW (ref 150–400)
RBC: 3.47 MIL/uL — ABNORMAL LOW (ref 3.87–5.11)
RDW: 15.7 % — ABNORMAL HIGH (ref 11.5–15.5)
WBC: 9.5 K/uL (ref 4.0–10.5)
nRBC: 0 % (ref 0.0–0.2)

## 2023-12-31 LAB — LACTIC ACID, PLASMA
Lactic Acid, Venous: 1.5 mmol/L (ref 0.5–1.9)
Lactic Acid, Venous: 2.2 mmol/L (ref 0.5–1.9)

## 2023-12-31 LAB — PHOSPHORUS: Phosphorus: 5.1 mg/dL — ABNORMAL HIGH (ref 2.5–4.6)

## 2023-12-31 LAB — HEPATITIS B SURFACE ANTIBODY, QUANTITATIVE: Hep B S AB Quant (Post): <3.5 m[IU]/mL — ABNORMAL LOW

## 2023-12-31 MED ORDER — SODIUM CHLORIDE 0.9 % IV BOLUS: 500.0000 mL | Freq: Once | INTRAVENOUS | Status: DC

## 2023-12-31 MED ORDER — NEPRO/CARBSTEADY PO LIQD
237.0000 mL | Freq: Two times a day (BID) | ORAL | Status: DC
  Administered 2023-12-31 – 2024-01-02 (×5): 237 mL via ORAL

## 2023-12-31 NOTE — Discharge Instructions (Signed)
 Barbara Crawford

## 2023-12-31 NOTE — Progress Notes (Signed)
 " PROGRESS NOTE    Barbara Crawford  FMW:[id number] DOB: 1955-10-02 DOA: 12/30/2023 PCP: Tammy Rasmussen T, PA-C  Subjective: No acute events overnight. Seen and examined at bedside. Reports feeling only slightly better in terms of shortness of breath. Still with a lot of cough. Tolerating oral intake without n/v. Denies constipation.   Hospital Course:  68 y.o. 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   The patient reported experiencing shortness of breath since Monday, with a hard cough starting on Sunday. The cough was non-productive and persistent. The patient attended all scheduled dialysis sessions, with no sessions shortened. By Wednesday (her birthday), the patient felt unwell after having dinner at her son's house. The patient expressed having no energy, no appetite, feeling feverish, and experiencing continued shortness of breath; as such, she reported to the ED after her symptoms failed to improve.   In the ED, pt hypertensive, tachycardic, and tachypneic with baseline 2L [place]. Labs notable for K 3.3, BUN/Cr 38/9, pro-BNP >35,000, troponin 124-->120, and lactic acid 7-->6.7. CXR showed nodular opacity within the left upper lung zone, possibly infectious or Inflammatory. 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   Sepsis  LUL CAP S. epidermidis bacteremia - 1 set Bcx 12/27 showed S. Epidermidis in both aerobic and anaerobic bottles - 2nd set Bcx 12/27 with NGTD,  final read pending - concern for likely contamination  -IV CTX 1g daily to complete 5 day CAP course -PO doxycycline  100mg  BID to complete 5 day CAP course -Duonebs prn -Wean O2 as tolerated -Ambulatory pulse ox prior to d/c -F/u blood cultures   Lactic acidosis - in the setting of sepsis - resolved with IV hydration in the ED - monitor clinically  ESRD -Renally dose medications for CrCl -Avoid lovenox, NSAIDs, morphine, Fleet's phosphate enema, regular insulin , contrast; no gadolinium for MRI to avoid nephrogenic systemic fibrosis - HD as per nephro recs - nephrology following   HFrEF -RD consulted for low sodium/renal diet teaching; appreciate eval/recs -Dialysis per above -PTA metoprolol  XL 25mg  daily  Hypoxia Resolved Likely in the setting of CAP Currently on 2 < 4L, at home on O2 See CAP treatment as below Monitor clinically   HTN -PTA losartan  25mg  daily   DVT prophylaxis: heparin  injection 5,000 Units Start: 12/30/23 0730      Code Status: Full Code  Disposition Plan: Home Reason for continuing need for hospitalization: Monitor for clinical improvement  Objective: Vitals:   12/30/23 2306 12/31/23 0331 12/31/23 0733 12/31/23 1151  BP: (!) 157/81 (!) 146/77 (!) 144/72 135/76  Pulse:   81 81  Resp: 19 18 17 17   Temp: 99.7 F (37.6 C) 98.4 F (36.9 C) 97.9 F (36.6 C) 97.9 F (36.6 C)  TempSrc: Oral Oral Oral Oral  SpO2: 97% 99% 100%   Weight:      Height:        Intake/Output Summary (Last 24 hours) at 12/31/2023 1322 Last data filed at 12/31/2023 1156 Gross  per 24 hour  Intake 600 ml  Output 900 ml  Net -300 ml   Filed Weights   12/30/23 1312 12/30/23 2117  Weight: 55.2 kg 55.1 kg    Examination:  Physical Exam Vitals and nursing note reviewed.  Constitutional:      General: She is not in acute distress.    Appearance: She is ill-appearing.  HENT:     Head: Normocephalic and atraumatic.  Cardiovascular:     Rate and Rhythm: Normal  rate and regular rhythm.     Pulses: Normal pulses.     Heart sounds: Normal heart sounds.  Pulmonary:     Effort: Pulmonary effort is normal.     Breath sounds: Normal breath sounds.  Abdominal:     General: Bowel sounds are normal.     Palpations: Abdomen is soft.  Neurological:     Mental Status: She is alert.     Data Reviewed: I have personally reviewed following labs and imaging studies  CBC: Recent Labs  Lab 12/30/23 0242 12/30/23 0257 12/30/23 0953 12/31/23 0307  WBC 8.8  --  9.4 9.5  NEUTROABS 7.1  --   --   --   HGB 11.4* 13.6 10.1* 10.3*  HCT 37.0 40.0 33.2* 33.2*  MCV 100.3*  --  101.5* 95.7  PLT 140*  --  136* 140*   Basic Metabolic Panel: Recent Labs  Lab 12/30/23 0242 12/30/23 0257 12/30/23 0953 12/31/23 0307  NA 140 140  --  134*  K 3.5 3.3*  --  4.0  CL 96* 101  --  94*  CO2 21*  --   --  24  GLUCOSE 79 74  --  58*  BUN 39* 38*  --  33*  CREATININE 8.69* 9.00* 9.18* 6.58*  CALCIUM  9.9  --   --  8.7*  PHOS  --   --   --  5.1*   GFR: Estimated Creatinine Clearance: 6.9 mL/min (A) (by C-G formula based on SCr of 6.58 mg/dL (H)). Liver Function Tests: Recent Labs  Lab 12/30/23 0242  AST 41  ALT 13  ALKPHOS 54  BILITOT 0.9  PROT 8.9*  ALBUMIN  3.7   No results for input(s): LIPASE, AMYLASE in the last 168 hours. No results for input(s): AMMONIA in the last 168 hours. Coagulation Profile: No results for input(s): INR, PROTIME in the last 168 hours. Cardiac Enzymes: No results for input(s): CKTOTAL, CKMB, CKMBINDEX, TROPONINI in the last 168 hours. ProBNP, BNP (last 5 results) Recent Labs    12/30/23 0242  PROBNP >35,000.0*   HbA1C: No results for input(s): HGBA1C in the last 72 hours. CBG: No results for input(s): GLUCAP in the last 168 hours. Lipid Profile: No results for input(s): CHOL, HDL, LDLCALC, TRIG, CHOLHDL, LDLDIRECT in the last 72 hours. Thyroid Function Tests: No results for  input(s): TSH, T4TOTAL, FREET4, T3FREE, THYROIDAB in the last 72 hours. Anemia Panel: No results for input(s): VITAMINB12, FOLATE, FERRITIN, TIBC, IRON, RETICCTPCT in the last 72 hours. Sepsis Labs: Recent Labs  Lab 12/30/23 0257 12/30/23 0420 12/31/23 1057  LATICACIDVEN 7.0* 6.7* 1.5    Recent Results (from the past 240 hours)  Resp panel by RT-PCR (RSV, Flu A&B, Covid) Anterior Nasal Swab     Status: None   Collection Time: 12/30/23  2:35 AM   Specimen: Anterior Nasal Swab  Result Value Ref Range Status   SARS Coronavirus 2 by RT PCR NEGATIVE NEGATIVE Final   Influenza A by PCR NEGATIVE NEGATIVE Final  Influenza B by PCR NEGATIVE NEGATIVE Final    Comment: (NOTE) The Xpert Xpress SARS-CoV-2/FLU/RSV plus assay is intended as an aid in the diagnosis of influenza from Nasopharyngeal swab specimens and should not be used as a sole basis for treatment. Nasal washings and aspirates are unacceptable for Xpert Xpress SARS-CoV-2/FLU/RSV testing.  Fact Sheet for Patients: bloggercourse.com  Fact Sheet for Healthcare Providers: seriousbroker.it  This test is not yet approved or cleared by the [place]  FDA and has been authorized for detection and/or diagnosis of SARS-CoV-2 by FDA under an Emergency Use Authorization (EUA). This EUA will remain in effect (meaning this test can be used) for the duration of the COVID-19 declaration under Section 564(b)(1) of the Act, 21 U.S.C. section 360bbb-3(b)(1), unless the authorization is terminated or revoked.     Resp Syncytial Virus by PCR NEGATIVE NEGATIVE Final    Comment: (NOTE) Fact Sheet for Patients: bloggercourse.com  Fact Sheet for Healthcare Providers: seriousbroker.it  This test is not yet approved or cleared by the [place]  FDA and has been authorized for detection and/or diagnosis of SARS-CoV-2  by FDA under an Emergency Use Authorization (EUA). This EUA will remain in effect (meaning this test can be used) for the duration of the COVID-19 declaration under Section 564(b)(1) of the Act, 21 U.S.C. section 360bbb-3(b)(1), unless the authorization is terminated or revoked.  Performed at [facility], [address] [place], [place] [address]   Culture, blood (routine x 2)     Status: Abnormal (Preliminary result)   Collection Time: 12/30/23  2:42 AM   Specimen: BLOOD RIGHT FOREARM  Result Value Ref Range Status   Specimen Description BLOOD RIGHT FOREARM  Final   Special Requests   Final    BOTTLES DRAWN AEROBIC AND ANAEROBIC Blood Culture adequate volume   Culture  Setup Time   Final    GRAM POSITIVE COCCI IN BOTH AEROBIC AND ANAEROBIC BOTTLES CRITICAL RESULT CALLED TO, READ BACK BY AND VERIFIED WITH: PHARMD HONORA BRAZIER [id number] AT 1930 BY EC    Culture (A)  Final    STAPHYLOCOCCUS EPIDERMIDIS THE SIGNIFICANCE OF ISOLATING THIS ORGANISM FROM A SINGLE SET OF BLOOD CULTURES WHEN MULTIPLE SETS ARE DRAWN IS UNCERTAIN. PLEASE NOTIFY THE MICROBIOLOGY DEPARTMENT WITHIN ONE WEEK IF SPECIATION AND SENSITIVITIES ARE REQUIRED. Performed at [facility], [address], [place], [place] [address]    Report Status PENDING  Incomplete  Blood Culture ID Panel (Reflexed)     Status: Abnormal   Collection Time: 12/30/23  2:42 AM  Result Value Ref Range Status   Enterococcus faecalis NOT DETECTED NOT DETECTED Final   Enterococcus Faecium NOT DETECTED NOT DETECTED Final   Listeria monocytogenes NOT DETECTED NOT DETECTED Final   Staphylococcus species DETECTED (A) NOT DETECTED Final    Comment: CRITICAL RESULT CALLED TO, READ BACK BY AND VERIFIED WITH: PHARMD HONORA BRAZIER [id number] AT 1930 BY EC    Staphylococcus aureus (BCID) NOT DETECTED NOT DETECTED Final   Staphylococcus epidermidis DETECTED (A) NOT DETECTED Final    Comment: CRITICAL RESULT CALLED TO, READ BACK BY AND  VERIFIED WITH: PHARMD HONORA BRAZIER [id number] AT [id number] BY EC    Staphylococcus lugdunensis NOT DETECTED NOT DETECTED Final   Streptococcus species NOT DETECTED NOT DETECTED Final   Streptococcus agalactiae NOT DETECTED NOT DETECTED Final   Streptococcus pneumoniae NOT DETECTED NOT DETECTED Final   Streptococcus pyogenes NOT DETECTED NOT DETECTED Final   A.calcoaceticus-baumannii NOT DETECTED NOT DETECTED Final   Bacteroides  fragilis NOT DETECTED NOT DETECTED Final   Enterobacterales NOT DETECTED NOT DETECTED Final   Enterobacter cloacae complex NOT DETECTED NOT DETECTED Final   Escherichia coli NOT DETECTED NOT DETECTED Final   Klebsiella aerogenes NOT DETECTED NOT DETECTED Final   Klebsiella oxytoca NOT DETECTED NOT DETECTED Final   Klebsiella pneumoniae NOT DETECTED NOT DETECTED Final   Proteus species NOT DETECTED NOT DETECTED Final   Salmonella species NOT DETECTED NOT DETECTED Final   Serratia marcescens NOT DETECTED NOT DETECTED Final   Haemophilus influenzae NOT DETECTED NOT DETECTED Final   Neisseria meningitidis NOT DETECTED NOT DETECTED Final   Pseudomonas aeruginosa NOT DETECTED NOT DETECTED Final   Stenotrophomonas maltophilia NOT DETECTED NOT DETECTED Final   Candida albicans NOT DETECTED NOT DETECTED Final   Candida auris NOT DETECTED NOT DETECTED Final   Candida glabrata NOT DETECTED NOT DETECTED Final   Candida krusei NOT DETECTED NOT DETECTED Final   Candida parapsilosis NOT DETECTED NOT DETECTED Final   Candida tropicalis NOT DETECTED NOT DETECTED Final   Cryptococcus neoformans/gattii NOT DETECTED NOT DETECTED Final   Methicillin resistance mecA/C NOT DETECTED NOT DETECTED Final    Comment: Performed at [facility], [address] [place], [place] [address]  Culture, blood (routine x 2)     Status: None (Preliminary result)   Collection Time: 12/30/23 10:10 AM   Specimen: BLOOD RIGHT HAND  Result Value Ref Range Status   Specimen Description BLOOD RIGHT  HAND  Final   Special Requests   Final    BOTTLES DRAWN AEROBIC AND ANAEROBIC Blood Culture adequate volume   Culture   Final    NO GROWTH < 24 HOURS Performed at [facility], [address] [place], [place] [address]    Report Status PENDING  Incomplete     Radiology Studies: DG Chest Port 1 View Result Date: 12/30/2023 EXAM: 1 VIEW(S) XRAY OF THE CHEST 12/30/2023 02:57:43 AM COMPARISON: None available. CLINICAL HISTORY: sob, fever FINDINGS: LUNGS AND PLEURA: Nodular opacity within the left upper lung zone appears new from prior examination and may be infectious or inflammatory in the acute setting. Follow-up chest radiograph is recommended in 3-4 weeks, following conservative therapy, to document resolution. If persistent at that time, dedicated contrast enhanced CT imaging of the chest is recommended for further evaluation. No pleural effusion. No pneumothorax. HEART AND MEDIASTINUM: Cardiomegaly. Aortic arch calcifications. BONES AND SOFT TISSUES: No acute osseous abnormality. IMPRESSION: 1. Nodular opacity within the left upper lung zone, possibly infectious or inflammatory; follow-up chest radiograph is recommended in 3-4 weeks, following conservative therapy, to document resolution, and if persistent at that time, dedicated contrast enhanced CT imaging of the chest is recommended for further evaluation. 2. Cardiomegaly and aortic arch calcifications. Electronically signed by: Dorethia Molt MD 12/30/2023 03:29 AM EST RP Workstation: [id number]    Scheduled Meds:  aspirin  EC  81 mg Oral Daily   doxycycline   100 mg Oral Q12H   feeding supplement (NEPRO CARB STEADY)  237 mL Oral BID BM   heparin   5,000 Units Subcutaneous Q8H   losartan   25 mg Oral Daily   metoprolol  succinate  25 mg Oral Daily   multivitamin  1 tablet Oral QHS   Continuous Infusions:  cefTRIAXone  (ROCEPHIN )  IV 1 g (12/31/23 0944)     LOS: 1 day   Norval Bar, MD  [facility]  12/31/2023, 1:22 PM    "

## 2023-12-31 NOTE — Progress Notes (Signed)
 Initial Nutrition Assessment  DOCUMENTATION CODES:   Not applicable  INTERVENTION:   -Continue renal diet with 1.2 L fluid restriction -Nepro Shake po BID, each supplement provides 425 kcal and 19 grams protein  -Continue renal MVI daily -RD provided Nutrition for Dialysis handout from [facility]; attached to AVS/ discharge summary -Patient also has access to outpatient RD at home HD center; [facility] can also be-consulted for additional support as an outpatient  NUTRITION DIAGNOSIS:   Increased nutrient needs related to chronic illness (ESRD on HD) as evidenced by estimated needs.  GOAL:   Patient will meet greater than or equal to 90% of their needs  MONITOR:   PO intake, Supplement acceptance  REASON FOR ASSESSMENT:   Consult Diet education  ASSESSMENT:   68 y.o. 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  Patient admitted with sepsis secondary to CAP and volume overload.   Reviewed I/O's: -660 ml x 24 hours  Patient unavailable at time of visit. Attempted to speak with patient via call to hospital room phone, however, unable to reach. RD unable to obtain further nutrition-related history or complete nutrition-focused physical exam at this time.    Patient currently on a renal diet with a 1.2 L fluid restriction. No meal completion data currently documented to review at this time.   Patient was evaluated by outpatient RD Francie Constable) on 02/2023 and 08/2023. Per notes, patient often skin meals secondary to HD sessions. She often feels too tired to cook meals after HD and sometimes reports to fast food. Per H&P, patient reports she over did it due to her birthday and the holidays and  presents with volume overload secondary to high sodium intake.   Reviewed weight history. Weight has ranged from 55.1-56.7 kg over the past 9 months. Suspect some weight fluctuations secondary to ESRD in HD and fluid overload. Patient's last HD was on 12/30/23 (on a Tuesday, Thursday, Saturday schedule at baseline). Per nephrology notes, her dry weight is 55.5 kg. She is currently below her dry weight.  Given patient's elevated phosphorus levels, will continue with renal diet and also add renal friendly nutritional supplements to help optimize intake.   Medications reviewed and include renal MVI.   Labs reviewed: Na: 134, K WDL, Phos: 5.1.    Diet Order:   Diet Order             Diet renal with fluid restriction Fluid restriction: 1200 mL Fluid; Room service appropriate? Yes; Fluid consistency: Thin  Diet effective now                   EDUCATION NEEDS:   Education needs have been addressed  Skin:     Last BM:  12/31/23 (type 6)  Height:   Ht Readings from Last 1 Encounters:  12/30/23 5' 3 (1.6 m)    Weight:   Wt Readings from Last 1 Encounters:  12/30/23 55.1 kg    Ideal Body Weight:  52.3 kg  BMI:  Body mass index is 21.52 kg/m.  Estimated Nutritional Needs:   Kcal:  1650-1850  Protein:  85-100 grams  Fluid:  1000 ml + UOP    Margery ORN, RD, LDN, CDCES Registered Dietitian III Certified Diabetes Care and Education  Specialist If unable to reach this RD, please use RD Inpatient group chat on secure chat between hours of 8am-4 pm daily

## 2023-12-31 NOTE — Progress Notes (Signed)
 [facility] NEPHROLOGY PROGRESS NOTE  Assessment/ Plan: Pt is a 68 y.o. yo female with ESRD on HD TTS, HTN, CAD, HFrEF, chronic respiratory failure on 2L [place] who is admitted with pneumonia.   Dialysis Orders:  [facility] T[facility] EDW 55.5kg per patient  # Sepsis 2/2 PNA.  Currently on ceftriaxone  and doxycycline .  Antibiotics per primary team, follow culture.  #ESRD.  HD TTS.  Last dialysis yesterday with around 1 L UF, plan for next HD on Tuesday. Access. L arm AVG.  #Hypertension/vol:  BP mostly acceptable, continue home medication.  UF with HD.    #Anemia. Hgb 9-11. Follow trends.   #CKD-Metabolic bone disease.  Ca, phos ok.   #Chronic resp failure. 2L [place] at baseline.   Subjective: Seen and examined.  Mild improvement in breathing.  Denies nausea, vomiting, chest pain.  No event overnight. Objective Vital signs in last 24 hours: Vitals:   12/30/23 2117 12/30/23 2306 12/31/23 0331 12/31/23 0733  BP: (!) 144/89 (!) 157/81 (!) 146/77 (!) 144/72  Pulse: (!) 107   81  Resp: 20 19 18 17   Temp: (!) 101.8 F (38.8 C) 99.7 F (37.6 C) 98.4 F (36.9 C) 97.9 F (36.6 C)  TempSrc: Oral Oral Oral Oral  SpO2: 98% 97% 99% 100%  Weight: 55.1 kg     Height: 5' 3 (1.6 m)      Weight change:   Intake/Output Summary (Last 24 hours) at 12/31/2023 1055 Last data filed at 12/31/2023 9666 Gross per 24 hour  Intake 240 ml  Output 900 ml  Net -660 ml       Labs: RENAL PANEL Recent Labs  Lab 12/30/23 0242 12/30/23 0257 12/30/23 0953 12/31/23 0307  NA 140 140  --  134*  K 3.5 3.3*  --  4.0  CL 96* 101  --  94*  CO2 21*  --   --  24  GLUCOSE 79 74  --  58*  BUN 39* 38*  --  33*  CREATININE 8.69* 9.00* 9.18* 6.58*  CALCIUM  9.9  --   --  8.7*  PHOS  --   --   --  5.1*  ALBUMIN  3.7  --   --   --     Liver Function Tests: Recent Labs  Lab 12/30/23 0242  AST 41  ALT 13  ALKPHOS 54  BILITOT 0.9  PROT 8.9*  ALBUMIN  3.7   No results for input(s): LIPASE,  AMYLASE in the last 168 hours. No results for input(s): AMMONIA in the last 168 hours. CBC: Recent Labs    10/27/23 0038 12/30/23 0242 12/30/23 0257 12/30/23 0953 12/31/23 0307  HGB 10.1* 11.4* 13.6 10.1* 10.3*  MCV 98.2 100.3*  --  101.5* 95.7    Cardiac Enzymes: No results for input(s): CKTOTAL, CKMB, CKMBINDEX, TROPONINI in the last 168 hours. CBG: No results for input(s): GLUCAP in the last 168 hours.  Iron Studies: No results for input(s): IRON, TIBC, TRANSFERRIN, FERRITIN in the last 72 hours. Studies/Results: DG Chest Port 1 View Result Date: 12/30/2023 EXAM: 1 VIEW(S) XRAY OF THE CHEST 12/30/2023 02:57:43 AM COMPARISON: None available. CLINICAL HISTORY: sob, fever FINDINGS: LUNGS AND PLEURA: Nodular opacity within the left upper lung zone appears new from prior examination and may be infectious or inflammatory in the acute setting. Follow-up chest radiograph is recommended in 3-4 weeks, following conservative therapy, to document resolution. If persistent at that time, dedicated contrast enhanced CT imaging of the chest is recommended for further evaluation.  No pleural effusion. No pneumothorax. HEART AND MEDIASTINUM: Cardiomegaly. Aortic arch calcifications. BONES AND SOFT TISSUES: No acute osseous abnormality. IMPRESSION: 1. Nodular opacity within the left upper lung zone, possibly infectious or inflammatory; follow-up chest radiograph is recommended in 3-4 weeks, following conservative therapy, to document resolution, and if persistent at that time, dedicated contrast enhanced CT imaging of the chest is recommended for further evaluation. 2. Cardiomegaly and aortic arch calcifications. Electronically signed by: Dorethia Molt MD 12/30/2023 03:29 AM EST RP Workstation: [id number]    Medications: Infusions:  cefTRIAXone  (ROCEPHIN )  IV 1 g (12/31/23 0944)    Scheduled Medications:  aspirin  EC  81 mg Oral Daily   doxycycline   100 mg Oral Q12H   feeding  supplement (NEPRO CARB STEADY)  237 mL Oral BID BM   heparin   5,000 Units Subcutaneous Q8H   losartan   25 mg Oral Daily   metoprolol  succinate  25 mg Oral Daily   multivitamin  1 tablet Oral QHS    have reviewed scheduled and prn medications.  Physical Exam: General:NAD, comfortable Heart:RRR, s1s2 nl Lungs: Some rhonchi on bases. Abdomen:soft, Non-tender, non-distended Extremities:No edema Dialysis Access: AV graft has thrill  Barbara Crawford Barbara Crawford 12/31/2023,10:55 AM  LOS: 1 day

## 2024-01-01 ENCOUNTER — Ambulatory Visit (HOSPITAL_COMMUNITY)

## 2024-01-01 LAB — BASIC METABOLIC PANEL WITH GFR
Anion gap: 19 — ABNORMAL HIGH (ref 5–15)
BUN: 52 mg/dL — ABNORMAL HIGH (ref 8–23)
CO2: 22 mmol/L (ref 22–32)
Calcium: 8.3 mg/dL — ABNORMAL LOW (ref 8.9–10.3)
Chloride: 94 mmol/L — ABNORMAL LOW (ref 98–111)
Creatinine, Ser: 8.18 mg/dL — ABNORMAL HIGH (ref 0.44–1.00)
GFR, Estimated: 5 mL/min — ABNORMAL LOW
Glucose, Bld: 80 mg/dL (ref 70–99)
Potassium: 4.3 mmol/L (ref 3.5–5.1)
Sodium: 134 mmol/L — ABNORMAL LOW (ref 135–145)

## 2024-01-01 LAB — CULTURE, BLOOD (ROUTINE X 2): Special Requests: ADEQUATE

## 2024-01-01 LAB — LACTIC ACID, PLASMA: Lactic Acid, Venous: 2 mmol/L (ref 0.5–1.9)

## 2024-01-01 MED ORDER — DOXYCYCLINE HYCLATE 100 MG PO TABS
100.0000 mg | ORAL_TABLET | Freq: Two times a day (BID) | ORAL | Status: DC
  Administered 2024-01-01 – 2024-01-02 (×3): 100 mg via ORAL
  Filled 2024-01-01 (×4): qty 1

## 2024-01-01 MED ORDER — HYDROCORTISONE 1 % EX CREA
1.0000 | TOPICAL_CREAM | Freq: Three times a day (TID) | CUTANEOUS | Status: DC | PRN
  Administered 2024-01-01: 1 via TOPICAL
  Filled 2024-01-01: qty 28

## 2024-01-01 MED ORDER — HEPARIN SODIUM (PORCINE) 5000 UNIT/ML IJ SOLN
5000.0000 [IU] | Freq: Three times a day (TID) | INTRAMUSCULAR | Status: DC
  Administered 2024-01-01 – 2024-01-02 (×4): 5000 [IU] via SUBCUTANEOUS
  Filled 2024-01-01 (×4): qty 1

## 2024-01-01 NOTE — TOC Initial Note (Signed)
 Transition of Care [facility]) - Initial/Assessment Note    Patient Details  Name: Barbara Crawford MRN: [id number] Date of Birth: 15-Jan-1955  Transition of [facility] CM/SW Contact:    Andrez JULIANNA George, RN Phone Number: 01/01/2024, 3:15 PM  Clinical Narrative:                 Barbara Crawford is a 68 y.o. 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  Pt is from home with her daughter. Daughter works during the daytime.  Pt is ESRD and goes to HD T,Th,Sat Pt manages her own medications and denies any issues.  Pt drives or her daughter can provide needed transportation.  Home health arranged with [facility] per pt choice. Information on the AVS. [facility] will contact her for the first home visit.  IP Care management following for further d/c needs.   Expected Discharge Plan: Home w Home Health Services Barriers to Discharge: Continued Medical Work up   Patient Goals and CMS Choice   CMS Medicare.gov Compare Post Acute Care list provided to:: Patient Choice offered to / list presented to : Patient      Expected Discharge Plan and Services   Discharge Planning Services: CM Consult Post Acute Care Choice: Home Health Living arrangements for the past 2 months: Apartment                           HH Arranged: PT HH Agency: [facility] Date [facility] Agency Contacted: 01/01/24   Representative spoke with at [facility] Agency: [place]  Prior Living Arrangements/Services Living arrangements for the past 2 months: Apartment Lives with:: Adult Children Patient language and need for interpreter reviewed:: Yes Do you feel safe going back to the place where you live?: Yes        Care giver support system in place?: Yes (comment) Current home services: DME (BSC/ walker/ cane/) Criminal  Activity/Legal Involvement Pertinent to Current Situation/Hospitalization: No - Comment as needed  Activities of Daily Living   ADL Screening (condition at time of admission) Independently performs ADLs?: Yes (appropriate for developmental age) Is the patient deaf or have difficulty hearing?: No Does the patient have difficulty seeing, even when wearing glasses/contacts?: No Does the patient have difficulty concentrating, remembering, or making decisions?: No  Permission Sought/Granted                  Emotional Assessment Appearance:: Appears stated age Attitude/Demeanor/Rapport: Screaming Affect (typically observed): Accepting Orientation: : Oriented to Self, Oriented to Place, Oriented to  Time, Oriented to Situation   Psych Involvement: No (comment)  Admission diagnosis:  ESRD (end stage renal disease) on dialysis (HCC) [N18.6, Z99.2] Patient Active Problem List   Diagnosis Date Noted   ESRD (end stage renal disease) on dialysis (HCC) 12/30/2023   Pneumonia 12/12/2021   Acute systolic CHF (congestive heart failure) (HCC)    CHF (congestive heart failure) (HCC) 10/06/2021   ESRD (end stage renal disease) (HCC)    Acute hypoxemic respiratory failure (HCC)    Respiratory failure (HCC) 08/15/2020   Encounter for orogastric (OG) tube placement    FSGS (focal segmental glomerulosclerosis)    Pleural effusion 03/07/2020   ABLA (acute blood loss anemia) 03/06/2020   Sepsis (HCC) versus SIRS  due to autoimmune process 03/05/2020   Fever    Glomerulonephritis    Perinephric hematoma 03/04/2020   Seizure (HCC) 03/04/2020   Multifocal pneumonia 03/04/2020   Acute renal failure 02/25/2020   Normocytic anemia 02/25/2020   Uremia 02/25/2020   HTN (hypertension) 02/25/2020   Acute bronchitis 08/16/2017   Tachycardia 08/16/2017   Stiffness of left shoulder joint 01/09/2017   Cough 02/08/2015   PCP:  Tammy Tari DASEN, PA-C Pharmacy:   [id number] [place], [place] - [address] AT [facility] [address] [place] [address] Phone: [phone] Fax: [phone]     Social Drivers of Health (SDOH) Social History: SDOH Screenings   Food Insecurity: No Food Insecurity (12/30/2023)  Housing: Low Risk (12/30/2023)  Transportation Needs: No Transportation Needs (12/30/2023)  Utilities: Not At Risk (12/30/2023)  Depression (PHQ2-9): Low Risk (03/03/2023)  Financial Resource Strain: Low Risk (04/04/2021)   Received from [facility]  Physical Activity: Unknown (04/04/2021)   Received from [facility] visits prior to 03/05/2022., [facility]  Social Connections: Moderately Isolated (12/30/2023)  Stress: No Stress Concern Present (04/04/2021)   Received from [facility] visits prior to 03/05/2022., [facility]  Tobacco Use: Low Risk (12/30/2023)   SDOH Interventions:     Readmission Risk Interventions     No data to display

## 2024-01-01 NOTE — Evaluation (Signed)
 Physical Therapy Evaluation Patient Details Name: Barbara Crawford MRN: [id number] DOB: 13-Nov-1955 Today's Date: 01/01/2024  History of Present Illness  68y.o. female presented on 12/30/23 from home due to SOB/DOE and found to have volume overload. PMH: ESRD, chronic hypoxic respiratory failure on 2L, HFrEF, CAD, HTN, anxiety. Chest xray completed showed nodular opacity within the left upper lung zone, possibly infectious or inflammatory.  Clinical Impression  PTA, pt was IND with functional mobility with use of quad cane or RW as needed depending on her energy levels. She has transportation that takes her to/from HD TuThSat and requires 2L supplemental oxygen  at baseline. Currently she requires CGA for transfers and ambulation up to 84ft with RW on 2L with sats 93-96%. She is functioning below her baseline with decreased functional strength and activity tolerance; see additional deficits listed below. She will benefit from skilled therapy while admitted to acute and follow up therapy with HHPT at discharge.         If plan is discharge home, recommend the following: A little help with walking and/or transfers;A little help with bathing/dressing/bathroom;Assist for transportation;Help with stairs or ramp for entrance   Can travel by private vehicle        Equipment Recommendations None recommended by PT  Recommendations for Other Services       Functional Status Assessment Patient has had a recent decline in their functional status and demonstrates the ability to make significant improvements in function in a reasonable and predictable amount of time.     Precautions / Restrictions Precautions Precautions: None Restrictions Weight Bearing Restrictions Per Provider Order: No      Mobility  Bed Mobility Overal bed mobility: Needs Assistance Bed Mobility: Supine to Sit, Sit to Supine     Supine to sit: HOB elevated, Used rails, Supervision Sit to supine: HOB elevated,  Used rails, Supervision   General bed mobility comments: increased time to complete task secondary to fatigue    Transfers Overall transfer level: Needs assistance Equipment used: Rolling walker (2 wheels) Transfers: Sit to/from Stand Sit to Stand: Contact guard assist                Ambulation/Gait Ambulation/Gait assistance: Contact guard assist Gait Distance (Feet): 50 Feet Assistive device: Rolling walker (2 wheels) Gait Pattern/deviations: Narrow base of support, Step-through pattern, Decreased step length - right, Decreased step length - left, Decreased stride length Gait velocity: decreased     General Gait Details: heavy reliance on RW secondary to decreased functional activity tolerance  Stairs            Wheelchair Mobility     Tilt Bed    Modified Rankin (Stroke Patients Only)       Balance Overall balance assessment: Needs assistance Sitting-balance support: Feet supported Sitting balance-Leahy Scale: Good     Standing balance support: During functional activity, Reliant on assistive device for balance Standing balance-Leahy Scale: Poor Standing balance comment: relies heavily on RW for mobility                             Pertinent Vitals/Pain Pain Assessment Pain Assessment: No/denies pain    Home Living Family/patient expects to be discharged to:: Private residence Living Arrangements: Children Available Help at Discharge: Family;Available PRN/intermittently Type of Home: Apartment Home Access: Stairs to enter Entrance Stairs-Rails: Left Entrance Stairs-Number of Steps: flight   Home Layout: One level Home Equipment: Agricultural Consultant (2 wheels);Cane - quad;BSC/3in1  Prior Function Prior Level of Function : Independent/Modified Independent;Driving             Mobility Comments: IND with quad cane or RW depending on energy levels; for the most part uses quad cane for household distances and RW for  community ADLs Comments: IND all ADLs, shared IADLs but daughter completes for the most part; energy dependent     Extremity/Trunk Assessment   Upper Extremity Assessment Upper Extremity Assessment: Generalized weakness    Lower Extremity Assessment Lower Extremity Assessment: Generalized weakness    Cervical / Trunk Assessment Cervical / Trunk Assessment: Normal  Communication   Communication Communication: No apparent difficulties    Cognition Arousal: Alert Behavior During Therapy: WFL for tasks assessed/performed   PT - Cognitive impairments: No apparent impairments                         Following commands: Intact       Cueing Cueing Techniques: Verbal cues     General Comments      Exercises     Assessment/Plan    PT Assessment Patient needs continued PT services  PT Problem List Cardiopulmonary status limiting activity;Decreased activity tolerance;Decreased balance;Decreased mobility;Decreased strength       PT Treatment Interventions Gait training;Stair training;Functional mobility training;Therapeutic exercise;DME instruction    PT Goals (Current goals can be found in the Care Plan section)  Acute Rehab PT Goals Patient Stated Goal: get my strength back PT Goal Formulation: With patient Time For Goal Achievement: 01/15/24 Potential to Achieve Goals: Good    Frequency Min 2X/week     Co-evaluation               AM-PAC PT 6 Clicks Mobility  Outcome Measure Help needed turning from your back to your side while in a flat bed without using bedrails?: A Little Help needed moving from lying on your back to sitting on the side of a flat bed without using bedrails?: A Little Help needed moving to and from a bed to a chair (including a wheelchair)?: A Little Help needed standing up from a chair using your arms (e.g., wheelchair or bedside chair)?: A Little Help needed to walk in hospital room?: A Little Help needed climbing 3-5  steps with a railing? : A Lot 6 Click Score: 17    End of Session Equipment Utilized During Treatment: Gait belt;Oxygen  Activity Tolerance: Patient tolerated treatment well;Patient limited by fatigue Patient left: in bed;with call bell/phone within reach Nurse Communication: Mobility status;Other (comment) (O2 sats with mobility) PT Visit Diagnosis: Muscle weakness (generalized) (M62.81);Difficulty in walking, not elsewhere classified (R26.2)    Time: 8975-8940 PT Time Calculation (min) (ACUTE ONLY): 35 min   Charges:   PT Evaluation $PT Eval Low Complexity: 1 Low PT Treatments $Therapeutic Activity: 8-22 mins           Isaiah DEL. Suriah Peragine, PT, DPT   [employer] 01/01/2024, 12:05 PM

## 2024-01-01 NOTE — Plan of Care (Signed)

## 2024-01-01 NOTE — Progress Notes (Signed)
 Bedside RN notified CSW that patient would like to speak to CSW. Patient would like to fill out Advance Directives. CSW informed patient that the Chaplain can assist. CSW notified bedside RN to place consult for Chaplain.   Barbara Crawford, MSW, LCSWA Transitions of Care [phone]

## 2024-01-01 NOTE — Progress Notes (Signed)
 " PROGRESS NOTE    Barbara Crawford  FMW:[id number] DOB: 1955/12/31 DOA: 12/30/2023 PCP: Tammy Rasmussen T, PA-C  Subjective: No acute events overnight. Seen and examined at bedside. Reports feeling better with improvement in shortness of breath. Tolerating oral intake without n/v. Denies constipation.   Hospital Course:  68 y.o. 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   The patient reported experiencing shortness of breath since Monday, with a hard cough starting on Sunday. The cough was non-productive and persistent. The patient attended all scheduled dialysis sessions, with no sessions shortened. By Wednesday (her birthday), the patient felt unwell after having dinner at her son's house. The patient expressed having no energy, no appetite, feeling feverish, and experiencing continued shortness of breath; as such, she reported to the ED after her symptoms failed to improve.   In the ED, pt hypertensive, tachycardic, and tachypneic with baseline 2L [place]. Labs notable for K 3.3, BUN/Cr 38/9, pro-BNP >35,000, troponin 124-->120, and lactic acid 7-->6.7. CXR showed nodular opacity within the left upper lung zone, possibly infectious or Inflammatory. 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   Sepsis  LUL CAP S. epidermidis bacteremia - 1 set Bcx 12/27 showed S. Epidermidis in both aerobic and anaerobic bottles - 2nd set Bcx 12/27 with NGTD, final read pending - concern for  likely contamination  -IV CTX 1g daily to complete 5 day CAP course -PO doxycycline  100mg  BID to complete 5 day CAP course -Duonebs prn -Wean O2 as tolerated -F/u blood cultures   Lactic acidosis - in the setting of sepsis - resolved with IV hydration in the ED - monitor clinically   ESRD -Renally dose medications for CrCl -Avoid lovenox, NSAIDs, morphine, Fleet's phosphate enema, regular insulin , contrast; no gadolinium for MRI to avoid nephrogenic systemic fibrosis - HD as per nephro recs - nephrology following   HFrEF -RD consulted for low sodium/renal diet teaching; appreciate eval/recs -Dialysis per above -PTA metoprolol  XL 25mg  daily   Hypoxia Resolved Likely in the setting of CAP Currently on 2 < 4L, at home on O2 See CAP treatment as below Monitor clinically   HTN -PTA losartan  25mg  daily  DVT prophylaxis: heparin  injection 5,000 Units Start: 01/01/24 1400 SCDs Start: 01/01/24 1134      Code Status: Full Code  Disposition Plan: Home Reason for continuing need for hospitalization: plan to discharge after HD session tomorrow to avoid a gap in therapy due to holiday scheduling of outpatient dialysis center  Objective: Vitals:   12/31/23 2300 01/01/24 0300 01/01/24 0722 01/01/24 1136  BP: 123/70 115/68 (!) 146/74 129/62  Pulse: 84 70 83 74  Resp: 18 18 18 18   Temp: (!) 97.5 F (36.4 C) 97.8 F (36.6 C) (!) 97.4 F (36.3 C) 97.6 F (36.4 C)  TempSrc: Oral Oral Oral Oral  SpO2: 100% 100% 100% 100%  Weight:      Height:        Intake/Output Summary (Last 24 hours) at 01/01/2024  1344 Last data filed at 01/01/2024 0821 Gross per 24 hour  Intake 660 ml  Output --  Net 660 ml   Filed Weights   12/30/23 1312 12/30/23 2117  Weight: 55.2 kg 55.1 kg    Examination:  Physical Exam Vitals and nursing note reviewed.  Constitutional:      General: She is not in acute distress.    Appearance: She is ill-appearing.     Comments: frail  HENT:      Head: Normocephalic and atraumatic.  Cardiovascular:     Rate and Rhythm: Normal rate and regular rhythm.     Pulses: Normal pulses.     Heart sounds: Normal heart sounds.  Pulmonary:     Effort: Pulmonary effort is normal.     Breath sounds: Normal breath sounds.  Abdominal:     General: Bowel sounds are normal.     Palpations: Abdomen is soft.  Neurological:     Mental Status: She is alert. Mental status is at baseline.     Data Reviewed: I have personally reviewed following labs and imaging studies  CBC: Recent Labs  Lab 12/30/23 0242 12/30/23 0257 12/30/23 0953 12/31/23 0307  WBC 8.8  --  9.4 9.5  NEUTROABS 7.1  --   --   --   HGB 11.4* 13.6 10.1* 10.3*  HCT 37.0 40.0 33.2* 33.2*  MCV 100.3*  --  101.5* 95.7  PLT 140*  --  136* 140*   Basic Metabolic Panel: Recent Labs  Lab 12/30/23 0242 12/30/23 0257 12/30/23 0953 12/31/23 0307 01/01/24 0231  NA 140 140  --  134* 134*  K 3.5 3.3*  --  4.0 4.3  CL 96* 101  --  94* 94*  CO2 21*  --   --  24 22  GLUCOSE 79 74  --  58* 80  BUN 39* 38*  --  33* 52*  CREATININE 8.69* 9.00* 9.18* 6.58* 8.18*  CALCIUM  9.9  --   --  8.7* 8.3*  PHOS  --   --   --  5.1*  --    GFR: Estimated Creatinine Clearance: 5.5 mL/min (A) (by C-G formula based on SCr of 8.18 mg/dL (H)). Liver Function Tests: Recent Labs  Lab 12/30/23 0242  AST 41  ALT 13  ALKPHOS 54  BILITOT 0.9  PROT 8.9*  ALBUMIN  3.7   No results for input(s): LIPASE, AMYLASE in the last 168 hours. No results for input(s): AMMONIA in the last 168 hours. Coagulation Profile: No results for input(s): INR, PROTIME in the last 168 hours. Cardiac Enzymes: No results for input(s): CKTOTAL, CKMB, CKMBINDEX, TROPONINI in the last 168 hours. ProBNP, BNP (last 5 results) Recent Labs    12/30/23 0242  PROBNP >35,000.0*   HbA1C: No results for input(s): HGBA1C in the last 72 hours. CBG: No results for input(s): GLUCAP in the last 168  hours. Lipid Profile: No results for input(s): CHOL, HDL, LDLCALC, TRIG, CHOLHDL, LDLDIRECT in the last 72 hours. Thyroid Function Tests: No results for input(s): TSH, T4TOTAL, FREET4, T3FREE, THYROIDAB in the last 72 hours. Anemia Panel: No results for input(s): VITAMINB12, FOLATE, FERRITIN, TIBC, IRON, RETICCTPCT in the last 72 hours. Sepsis Labs: Recent Labs  Lab 12/30/23 0420 12/31/23 1057 12/31/23 1503 01/01/24 0231  LATICACIDVEN 6.7* 1.5 2.2* 2.0*    Recent Results (from the past 240 hours)  Resp panel by RT-PCR (RSV, Flu A&B, Covid) Anterior Nasal Swab     Status: None   Collection Time: 12/30/23  2:35 AM   Specimen: Anterior Nasal Swab  Result Value Ref Range Status   SARS Coronavirus 2 by RT PCR NEGATIVE NEGATIVE Final   Influenza A by PCR NEGATIVE NEGATIVE Final   Influenza B by PCR NEGATIVE NEGATIVE Final    Comment: (NOTE) The Xpert Xpress SARS-CoV-2/FLU/RSV plus assay is intended as an aid in the diagnosis of influenza from Nasopharyngeal swab specimens and should not be used as a sole basis for treatment. Nasal washings and aspirates are unacceptable for Xpert Xpress SARS-CoV-2/FLU/RSV testing.  Fact Sheet for Patients: bloggercourse.com  Fact Sheet for Healthcare Providers: seriousbroker.it  This test is not yet approved or cleared by the [place]  FDA and has been authorized for detection and/or diagnosis of SARS-CoV-2 by FDA under an Emergency Use Authorization (EUA). This EUA will remain in effect (meaning this test can be used) for the duration of the COVID-19 declaration under Section 564(b)(1) of the Act, 21 U.S.C. section 360bbb-3(b)(1), unless the authorization is terminated or revoked.     Resp Syncytial Virus by PCR NEGATIVE NEGATIVE Final    Comment: (NOTE) Fact Sheet for Patients: bloggercourse.com  Fact Sheet for Healthcare  Providers: seriousbroker.it  This test is not yet approved or cleared by the [place]  FDA and has been authorized for detection and/or diagnosis of SARS-CoV-2 by FDA under an Emergency Use Authorization (EUA). This EUA will remain in effect (meaning this test can be used) for the duration of the COVID-19 declaration under Section 564(b)(1) of the Act, 21 U.S.C. section 360bbb-3(b)(1), unless the authorization is terminated or revoked.  Performed at [facility], [address], [place], [place] [address]   Culture, blood (routine x 2)     Status: Abnormal   Collection Time: 12/30/23  2:42 AM   Specimen: BLOOD RIGHT FOREARM  Result Value Ref Range Status   Specimen Description BLOOD RIGHT FOREARM  Final   Special Requests   Final    BOTTLES DRAWN AEROBIC AND ANAEROBIC Blood Culture adequate volume   Culture  Setup Time   Final    GRAM POSITIVE COCCI IN BOTH AEROBIC AND ANAEROBIC BOTTLES CRITICAL RESULT CALLED TO, READ BACK BY AND VERIFIED WITH: PHARMD HONORA BRAZIER [id number] AT 1930 BY EC    Culture (A)  Final    STAPHYLOCOCCUS EPIDERMIDIS THE SIGNIFICANCE OF ISOLATING THIS ORGANISM FROM A SINGLE SET OF BLOOD CULTURES WHEN MULTIPLE SETS ARE DRAWN IS UNCERTAIN. PLEASE NOTIFY THE MICROBIOLOGY DEPARTMENT WITHIN ONE WEEK IF SPECIATION AND SENSITIVITIES ARE REQUIRED. Performed at [facility], [address], [place], [place] [address]    Report Status 01/01/2024 FINAL  Final  Blood Culture ID Panel (Reflexed)     Status: Abnormal   Collection Time: 12/30/23  2:42 AM  Result Value Ref Range Status   Enterococcus faecalis NOT DETECTED NOT DETECTED Final   Enterococcus Faecium NOT DETECTED NOT DETECTED Final   Listeria monocytogenes NOT DETECTED NOT DETECTED Final   Staphylococcus species DETECTED (A) NOT DETECTED Final    Comment: CRITICAL RESULT CALLED TO, READ BACK BY AND VERIFIED WITH: PHARMD HONORA BRAZIER [id number] AT 1930 BY EC     Staphylococcus aureus (BCID) NOT DETECTED NOT DETECTED Final   Staphylococcus epidermidis DETECTED (A) NOT DETECTED Final    Comment: CRITICAL RESULT CALLED TO, READ BACK BY AND VERIFIED WITH: PHARMD HONORA BRAZIER [id number] AT [id number] BY EC    Staphylococcus lugdunensis NOT DETECTED NOT DETECTED Final   Streptococcus species NOT DETECTED NOT DETECTED Final   Streptococcus  agalactiae NOT DETECTED NOT DETECTED Final   Streptococcus pneumoniae NOT DETECTED NOT DETECTED Final   Streptococcus pyogenes NOT DETECTED NOT DETECTED Final   A.calcoaceticus-baumannii NOT DETECTED NOT DETECTED Final   Bacteroides fragilis NOT DETECTED NOT DETECTED Final   Enterobacterales NOT DETECTED NOT DETECTED Final   Enterobacter cloacae complex NOT DETECTED NOT DETECTED Final   Escherichia coli NOT DETECTED NOT DETECTED Final   Klebsiella aerogenes NOT DETECTED NOT DETECTED Final   Klebsiella oxytoca NOT DETECTED NOT DETECTED Final   Klebsiella pneumoniae NOT DETECTED NOT DETECTED Final   Proteus species NOT DETECTED NOT DETECTED Final   Salmonella species NOT DETECTED NOT DETECTED Final   Serratia marcescens NOT DETECTED NOT DETECTED Final   Haemophilus influenzae NOT DETECTED NOT DETECTED Final   Neisseria meningitidis NOT DETECTED NOT DETECTED Final   Pseudomonas aeruginosa NOT DETECTED NOT DETECTED Final   Stenotrophomonas maltophilia NOT DETECTED NOT DETECTED Final   Candida albicans NOT DETECTED NOT DETECTED Final   Candida auris NOT DETECTED NOT DETECTED Final   Candida glabrata NOT DETECTED NOT DETECTED Final   Candida krusei NOT DETECTED NOT DETECTED Final   Candida parapsilosis NOT DETECTED NOT DETECTED Final   Candida tropicalis NOT DETECTED NOT DETECTED Final   Cryptococcus neoformans/gattii NOT DETECTED NOT DETECTED Final   Methicillin resistance mecA/C NOT DETECTED NOT DETECTED Final    Comment: Performed at [facility], [address], [place], [place] [address]  Culture, blood (routine  x 2)     Status: None (Preliminary result)   Collection Time: 12/30/23 10:10 AM   Specimen: BLOOD RIGHT HAND  Result Value Ref Range Status   Specimen Description BLOOD RIGHT HAND  Final   Special Requests   Final    BOTTLES DRAWN AEROBIC AND ANAEROBIC Blood Culture adequate volume   Culture   Final    NO GROWTH 2 DAYS Performed at [facility], [address], [place], [place] [address]    Report Status PENDING  Incomplete     Radiology Studies: No results found.  Scheduled Meds:  aspirin  EC  81 mg Oral Daily   doxycycline   100 mg Oral Q12H   feeding supplement (NEPRO CARB STEADY)  237 mL Oral BID BM   heparin   5,000 Units Subcutaneous Q8H   metoprolol  succinate  25 mg Oral Daily   multivitamin  1 tablet Oral QHS   Continuous Infusions:  cefTRIAXone  (ROCEPHIN )  IV 1 g (01/01/24 1037)     LOS: 2 days   Norval Bar, MD  [facility]  01/01/2024, 1:44 PM   "

## 2024-01-01 NOTE — Progress Notes (Signed)
" °   01/01/24 1457  Spiritual Encounters  Type of Visit Initial  Care provided to: Patient  Reason for visit Advance directives  OnCall Visit No  Spiritual Framework  Presenting Themes Impactful experiences and emotions  Community/Connection Family  Patient Stress Factors Health changes  Family Stress Factors None identified  Interventions  Spiritual Care Interventions Made Compassionate presence;Established relationship of care and support;Normalization of emotions  Intervention Outcomes  Outcomes Awareness of support;Awareness of health   Chaplain provided education to Pt about the Advance Directive. Pt did state she would like to complete to documentation. Chaplain and Pt was able to complete the AD, however was not able to have documentation notarized. Will follow up with Notary and volunteer service tomorrow. "

## 2024-01-01 NOTE — Progress Notes (Signed)
 [facility] NEPHROLOGY PROGRESS NOTE  Assessment/ Plan: Pt is a 68 y.o. yo female with ESRD on HD TTS, HTN, CAD, HFrEF, chronic respiratory failure on 2L [place] who is admitted with pneumonia.    # Sepsis 2/2 PNA.   - Currently on ceftriaxone  and doxycycline .   - Antibiotics per primary team, follow cultures; 12/27 blood culture with staph epidermidis in 1/2 bottles  - repeat blood cultures - may be contaminated   #ESRD   - Continue HD per TTS schedule  - [facility] as below - would lower EDW to 54.5 kg per her last outpatient post weight.  Follow trends as may need to further adjust  #Hypertension/volume:   - controlled on current regimen - optimizing volume with HD.  Note that she left 1 kg below her EDW; it would seem that volume is less of an issue. Lower EDW as above - continue home medications    #Anemia of CKD. Hgb acceptable; follow for now   #CKD-Metabolic bone disease.  Mild hyperphos - at goal   #Chronic resp failure. 2L [place] at baseline.   Disposition - per primary team     Subjective:  She had one unmeasured urine void over 12/28 charted.  Last HD on 12/27 with 0.9 kg UF.  Not currently wearing oxygen  and states has it at home but doesn't always need it.  Felt awful before admission   Review of systems:  Reports her shortness of breath has improved Denies n/v No cp     Objective Vital signs in last 24 hours: Vitals:   12/31/23 2300 01/01/24 0300 01/01/24 0722 01/01/24 1136  BP: 123/70 115/68 (!) 146/74 129/62  Pulse: 84 70 83 74  Resp: 18 18 18 18   Temp: (!) 97.5 F (36.4 C) 97.8 F (36.6 C) (!) 97.4 F (36.3 C) 97.6 F (36.4 C)  TempSrc: Oral Oral Oral Oral  SpO2: 100% 100% 100% 100%  Weight:      Height:       Weight change:   Intake/Output Summary (Last 24 hours) at 01/01/2024 1235 Last data filed at 01/01/2024 9178 Gross per 24 hour  Intake 1020 ml  Output --  Net 1020 ml        Labs: RENAL PANEL Recent Labs  Lab 12/30/23 0242 12/30/23 0257 12/30/23 0953 12/31/23 0307 01/01/24 0231  NA 140 140  --  134* 134*  K 3.5 3.3*  --  4.0 4.3  CL 96* 101  --  94* 94*  CO2 21*  --   --  24 22  GLUCOSE 79 74  --  58* 80  BUN 39* 38*  --  33* 52*  CREATININE 8.69* 9.00* 9.18* 6.58* 8.18*  CALCIUM  9.9  --   --  8.7* 8.3*  PHOS  --   --   --  5.1*  --   ALBUMIN  3.7  --   --   --   --     Liver Function Tests: Recent Labs  Lab 12/30/23 0242  AST 41  ALT 13  ALKPHOS 54  BILITOT 0.9  PROT 8.9*  ALBUMIN  3.7   No results for input(s): LIPASE, AMYLASE in the last 168 hours. No results for input(s): AMMONIA in the last 168 hours. CBC: Recent Labs    10/27/23 0038 12/30/23 0242 12/30/23 0257 12/30/23 0953 12/31/23 0307  HGB 10.1* 11.4* 13.6 10.1* 10.3*  MCV 98.2 100.3*  --  101.5* 95.7  Cardiac Enzymes: No results for input(s): CKTOTAL, CKMB, CKMBINDEX, TROPONINI in the last 168 hours. CBG: No results for input(s): GLUCAP in the last 168 hours.  Iron Studies: No results for input(s): IRON, TIBC, TRANSFERRIN, FERRITIN in the last 72 hours. Studies/Results: No results found.   Medications: Infusions:  cefTRIAXone  (ROCEPHIN )  IV 1 g (01/01/24 1037)    Scheduled Medications:  aspirin  EC  81 mg Oral Daily   doxycycline   100 mg Oral Q12H   feeding supplement (NEPRO CARB STEADY)  237 mL Oral BID BM   heparin   5,000 Units Subcutaneous Q8H   metoprolol  succinate  25 mg Oral Daily   multivitamin  1 tablet Oral QHS    have reviewed scheduled and prn medications.  Physical Exam:    General adult female in bed in no acute distress HEENT normocephalic atraumatic extraocular movements intact sclera anicteric Neck supple trachea midline Lungs decreased breath sounds and occ coarse breath sounds; normal work of breathing at rest on room air; occ wet-sounding cough Heart S1S2 no rub Abdomen soft nontender  nondistended Extremities trace edema lower extremities Psych normal mood and affect Neuro alert and oriented x 3 provides hx and follows commands  Access LUE AVG with bruit   Outpatient Dialysis Orders:  [facility] TTS  Access. L arm AVG 2k/2.5 ca F180 dialyzer EDW 55.5 kg  BF 400 DF 600 No heparin   3.5 hours  Last outpatient HD on 12/24 - post weight was 54.5 kg (1 kg below EDW) Meds: 5600 units of epo each tx Zemplar 4.6 mcg per tx   Barbara Crawford 01/01/2024,1:11 PM  LOS: 2 days

## 2024-01-02 ENCOUNTER — Other Ambulatory Visit (HOSPITAL_COMMUNITY): Payer: Self-pay

## 2024-01-02 MED ORDER — ANTICOAGULANT SODIUM CITRATE 4% (200MG/5ML) IV SOLN: 5.0000 mL | Status: DC | PRN

## 2024-01-02 MED ORDER — LIDOCAINE HCL (PF) 1 % IJ SOLN: 5.0000 mL | INTRAMUSCULAR | Status: DC | PRN

## 2024-01-02 MED ORDER — DOXYCYCLINE HYCLATE 100 MG PO TABS
100.0000 mg | ORAL_TABLET | Freq: Two times a day (BID) | ORAL | 0 refills | Status: AC
  Filled 2024-01-02: qty 2, 1d supply, fill #0

## 2024-01-02 MED ORDER — CEFPODOXIME PROXETIL 200 MG PO TABS
200.0000 mg | ORAL_TABLET | Freq: Two times a day (BID) | ORAL | 0 refills | Status: DC
  Filled 2024-01-02: qty 2, 1d supply, fill #0

## 2024-01-02 MED ORDER — HEPARIN SODIUM (PORCINE) 1000 UNIT/ML DIALYSIS: 1000.0000 [IU] | INTRAMUSCULAR | Status: DC | PRN

## 2024-01-02 MED ORDER — PENTAFLUOROPROP-TETRAFLUOROETH EX AERO: 1.0000 | INHALATION_SPRAY | CUTANEOUS | Status: DC | PRN

## 2024-01-02 MED ORDER — LIDOCAINE-PRILOCAINE 2.5-2.5 % EX CREA: 1.0000 | TOPICAL_CREAM | CUTANEOUS | Status: DC | PRN

## 2024-01-02 MED ORDER — ALTEPLASE 2 MG IJ SOLR: 2.0000 mg | Freq: Once | INTRAMUSCULAR | Status: DC | PRN

## 2024-01-02 MED ORDER — CEFPODOXIME PROXETIL 200 MG PO TABS
200.0000 mg | ORAL_TABLET | Freq: Every day | ORAL | 0 refills | Status: AC
  Filled 2024-01-02: qty 1, 1d supply, fill #0

## 2024-01-02 MED ORDER — GUAIFENESIN-DM 100-10 MG/5ML PO SYRP
10.0000 mL | ORAL_SOLUTION | ORAL | 0 refills | Status: DC | PRN
  Filled 2024-01-02: qty 118, 2d supply, fill #0

## 2024-01-02 MED ORDER — PENTAFLUOROPROP-TETRAFLUOROETH EX AERO
INHALATION_SPRAY | CUTANEOUS | Status: AC
  Filled 2024-01-02: qty 30

## 2024-01-02 NOTE — Progress Notes (Signed)
 Patient would like her daily meds and antibiotics to be administered after HD. RN verbalize understanding

## 2024-01-02 NOTE — Discharge Summary (Addendum)
 " [facility] Phys[facility] Discharge Summary   Patient name: Barbara Crawford  Admit date:     12/30/2023  Discharge date: 01/02/2024  Attending Physician: GEORGINA BASKET [[id number]]  Discharge Physician: Norval Bar   PCP: Tammy Tari DASEN, PA-C  Admitted From: Home  Disposition:  Home  Recommendations for Outpatient Follow-up:  Follow up with PCP in 1-2 weeks Finished 5 day antibiotic course on 12/31  Home Health:No Equipment/Devices: @ECDMELIST @  Discharge Condition:Stable CODE STATUS:FULL Diet recommendation: Renal Fluid Restriction: 1200 ml/day  Hospital Summary:   68 y.o. 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   Patient treated with IV antibiotics and transitioned to PO doxycycline  and cefpodoxime to finish 5 days total course. Found to have S. epidermidis bacteremia in only 1 of 2 sets that was a contaminant. Tolerated dialysis while inpatient well. Stable for discharge home.   Discharge Diagnoses:  Principal Problem:   ESRD (end stage renal disease) on dialysis [facility])   Discharge Instructions  Discharge Instructions     Diet renal with fluid restriction   Complete by: As directed    Increase activity slowly   Complete by: As directed    Increase activity slowly   Complete by: As directed    No wound care   Complete by: As directed       Allergies as of 01/02/2024       Reactions   Bee Venom Anaphylaxis   Shellfish Protein-containing Drug Products Anaphylaxis, Swelling, Other (See Comments)   Sweat and Dizzy. Lobster    Azithromycin  Hives   hives   Amoxicillin    Shortness of breath    Darbepoetin Alfa  Other (See Comments)   Egg Solids, Whole    Other Reaction(s): GI Intolerance   Iodinated Contrast Media    Other Reaction(s): Unknown   Bidil  [isosorb  Dinitrate-hydralazine ]    headache   Chlorhexidine  Rash   Egg Protein-containing Drug Products Nausea Only   Erythromycin Nausea Only, Other (See Comments)   Cramps    Hydralazine     headache   Iodine Rash, Other (See Comments)   Arm flares up per pt   Penicillins Rash   Has patient had a PCN reaction causing immediate rash, facial/tongue/throat swelling, SOB or lightheadedness with hypotension: no Has patient had a PCN reaction causing severe rash involving mucus membranes or skin necrosis: yes Has patient had a PCN reaction that required hospitalization: no Has patient had a PCN reaction occurring within the last 10 years: no If all of the above answers are NO, then may proceed with Cephalosporin use. Other Reaction(s): GI Intolerance        Medication List     STOP taking these medications    losartan  25 MG tablet Commonly known as: COZAAR        TAKE these medications    acetaminophen  500 MG tablet Commonly known as: TYLENOL  Take 500 mg by mouth as needed for mild pain, moderate pain or fever.   aspirin  EC 81 MG tablet Take 81 mg by mouth daily.   cefpodoxime 200 MG tablet Commonly known as: VANTIN Take 1 tablet (200 mg total) by mouth daily for 1 day.   diphenhydrAMINE  2 % cream Commonly known as: BENADRYL  Apply 1 Application topically 2 (two) times daily as needed for itching.   doxycycline  100 MG tablet  Commonly known as: VIBRA -TABS Take 1 tablet (100 mg total) by mouth every 12 (twelve) hours for 1 day.   ferric citrate 1 GM 210 MG(Fe) tablet Commonly known as: AURYXIA Take 210-420 mg by mouth See admin instructions. 420 mg with meals, 210 mg with snacks   guaiFENesin -dextromethorphan  100-10 MG/5ML syrup Commonly known as: ROBITUSSIN DM Take 10 mLs by mouth every 4 (four) hours as needed for cough.   LORazepam  0.5 MG tablet Commonly known as: ATIVAN  Take 0.5 mg by mouth 2 (two) times daily as needed for anxiety.   metoprolol  succinate 25 MG 24  hr tablet Commonly known as: TOPROL -XL Take 1 tablet (25 mg total) by mouth daily.   multivitamin Tabs tablet Take 1 tablet by mouth at bedtime.   OXYGEN  Inhale 2 L/min into the lungs continuous.        Contact information for after-discharge care     Home Medical Care     [facility]) .   Service: Home Health Services Contact information: [address] [place]  [address] [phone]                    Allergies[1]  Discharge Exam: Vitals:   01/02/24 0714 01/02/24 0855  BP: 125/66   Pulse: 72 77  Resp: 18   Temp: (!) 97.4 F (36.3 C)   SpO2: 99% 100%    Physical Exam Vitals and nursing note reviewed.  Constitutional:      General: She is not in acute distress.    Appearance: She is ill-appearing.     Comments: frail  HENT:     Head: Normocephalic and atraumatic.  Cardiovascular:     Rate and Rhythm: Normal rate and regular rhythm.     Pulses: Normal pulses.     Heart sounds: Normal heart sounds.  Pulmonary:     Effort: Pulmonary effort is normal.     Breath sounds: Normal breath sounds.  Abdominal:     General: Bowel sounds are normal. There is no distension.     Palpations: Abdomen is soft.     Tenderness: There is no abdominal tenderness.  Neurological:     Mental Status: She is alert. Mental status is at baseline.     The results of significant diagnostics from this hospitalization (including imaging, microbiology, ancillary and laboratory) are listed below for reference.    Microbiology: Recent Results (from the past 240 hours)  Resp panel by RT-PCR (RSV, Flu A&B, Covid) Anterior Nasal Swab     Status: None   Collection Time: 12/30/23  2:35 AM   Specimen: Anterior Nasal Swab  Result Value Ref Range Status   SARS Coronavirus 2 by RT PCR NEGATIVE NEGATIVE Final   Influenza A by PCR NEGATIVE NEGATIVE Final   Influenza B by PCR NEGATIVE NEGATIVE Final    Comment:  (NOTE) The Xpert Xpress SARS-CoV-2/FLU/RSV plus assay is intended as an aid in the diagnosis of influenza from Nasopharyngeal swab specimens and should not be used as a sole basis for treatment. Nasal washings and aspirates are unacceptable for Xpert Xpress SARS-CoV-2/FLU/RSV testing.  Fact Sheet for Patients: bloggercourse.com  Fact Sheet for Healthcare Providers: seriousbroker.it  This test is not yet approved or cleared by the [place]  FDA and has been authorized for detection and/or diagnosis of SARS-CoV-2 by FDA under an Emergency Use Authorization (EUA). This EUA will remain in effect (meaning this test can be used) for the duration of the  COVID-19 declaration under Section 564(b)(1) of the Act, 21 U.S.C. section 360bbb-3(b)(1), unless the authorization is terminated or revoked.     Resp Syncytial Virus by PCR NEGATIVE NEGATIVE Final    Comment: (NOTE) Fact Sheet for Patients: bloggercourse.com  Fact Sheet for Healthcare Providers: seriousbroker.it  This test is not yet approved or cleared by the [place]  FDA and has been authorized for detection and/or diagnosis of SARS-CoV-2 by FDA under an Emergency Use Authorization (EUA). This EUA will remain in effect (meaning this test can be used) for the duration of the COVID-19 declaration under Section 564(b)(1) of the Act, 21 U.S.C. section 360bbb-3(b)(1), unless the authorization is terminated or revoked.  Performed at [facility], [address], [place], [place] [address]   Culture, blood (routine x 2)     Status: Abnormal   Collection Time: 12/30/23  2:42 AM   Specimen: BLOOD RIGHT FOREARM  Result Value Ref Range Status   Specimen Description BLOOD RIGHT FOREARM  Final   Special Requests   Final    BOTTLES DRAWN AEROBIC AND ANAEROBIC Blood Culture adequate volume   Culture  Setup Time   Final    GRAM  POSITIVE COCCI IN BOTH AEROBIC AND ANAEROBIC BOTTLES CRITICAL RESULT CALLED TO, READ BACK BY AND VERIFIED WITH: PHARMD HONORA BRAZIER [id number] AT 1930 BY EC    Culture (A)  Final    STAPHYLOCOCCUS EPIDERMIDIS THE SIGNIFICANCE OF ISOLATING THIS ORGANISM FROM A SINGLE SET OF BLOOD CULTURES WHEN MULTIPLE SETS ARE DRAWN IS UNCERTAIN. PLEASE NOTIFY THE MICROBIOLOGY DEPARTMENT WITHIN ONE WEEK IF SPECIATION AND SENSITIVITIES ARE REQUIRED. Performed at [facility], [address], [place], [place] [address]    Report Status 01/01/2024 FINAL  Final  Blood Culture ID Panel (Reflexed)     Status: Abnormal   Collection Time: 12/30/23  2:42 AM  Result Value Ref Range Status   Enterococcus faecalis NOT DETECTED NOT DETECTED Final   Enterococcus Faecium NOT DETECTED NOT DETECTED Final   Listeria monocytogenes NOT DETECTED NOT DETECTED Final   Staphylococcus species DETECTED (A) NOT DETECTED Final    Comment: CRITICAL RESULT CALLED TO, READ BACK BY AND VERIFIED WITH: PHARMD HONORA BRAZIER [id number] AT 1930 BY EC    Staphylococcus aureus (BCID) NOT DETECTED NOT DETECTED Final   Staphylococcus epidermidis DETECTED (A) NOT DETECTED Final    Comment: CRITICAL RESULT CALLED TO, READ BACK BY AND VERIFIED WITH: PHARMD HONORA BRAZIER [id number] AT [id number] BY EC    Staphylococcus lugdunensis NOT DETECTED NOT DETECTED Final   Streptococcus species NOT DETECTED NOT DETECTED Final   Streptococcus agalactiae NOT DETECTED NOT DETECTED Final   Streptococcus pneumoniae NOT DETECTED NOT DETECTED Final   Streptococcus pyogenes NOT DETECTED NOT DETECTED Final   A.calcoaceticus-baumannii NOT DETECTED NOT DETECTED Final   Bacteroides fragilis NOT DETECTED NOT DETECTED Final   Enterobacterales NOT DETECTED NOT DETECTED Final   Enterobacter cloacae complex NOT DETECTED NOT DETECTED Final   Escherichia coli NOT DETECTED NOT DETECTED Final   Klebsiella aerogenes NOT DETECTED NOT DETECTED Final   Klebsiella oxytoca NOT  DETECTED NOT DETECTED Final   Klebsiella pneumoniae NOT DETECTED NOT DETECTED Final   Proteus species NOT DETECTED NOT DETECTED Final   Salmonella species NOT DETECTED NOT DETECTED Final   Serratia marcescens NOT DETECTED NOT DETECTED Final   Haemophilus influenzae NOT DETECTED NOT DETECTED Final   Neisseria meningitidis NOT DETECTED NOT DETECTED Final   Pseudomonas aeruginosa NOT DETECTED NOT DETECTED Final   Stenotrophomonas  maltophilia NOT DETECTED NOT DETECTED Final   Candida albicans NOT DETECTED NOT DETECTED Final   Candida auris NOT DETECTED NOT DETECTED Final   Candida glabrata NOT DETECTED NOT DETECTED Final   Candida krusei NOT DETECTED NOT DETECTED Final   Candida parapsilosis NOT DETECTED NOT DETECTED Final   Candida tropicalis NOT DETECTED NOT DETECTED Final   Cryptococcus neoformans/gattii NOT DETECTED NOT DETECTED Final   Methicillin resistance mecA/C NOT DETECTED NOT DETECTED Final    Comment: Performed at [facility], [address] [place], [place] [address]  Culture, blood (routine x 2)     Status: None (Preliminary result)   Collection Time: 12/30/23 10:10 AM   Specimen: BLOOD RIGHT HAND  Result Value Ref Range Status   Specimen Description BLOOD RIGHT HAND  Final   Special Requests   Final    BOTTLES DRAWN AEROBIC AND ANAEROBIC Blood Culture adequate volume   Culture   Final    NO GROWTH 3 DAYS Performed at [facility], [address], [place], [place] [address]    Report Status PENDING  Incomplete  Culture, blood (Routine X 2) w Reflex to ID Panel     Status: None (Preliminary result)   Collection Time: 01/01/24  1:16 PM   Specimen: BLOOD RIGHT ARM  Result Value Ref Range Status   Specimen Description BLOOD RIGHT ARM  Final   Special Requests   Final    BOTTLES DRAWN AEROBIC ONLY Blood Culture adequate volume   Culture   Final    NO GROWTH < 24 HOURS Performed at [facility], [address], [place], [place] [address]    Report  Status PENDING  Incomplete  Culture, blood (Routine X 2) w Reflex to ID Panel     Status: None (Preliminary result)   Collection Time: 01/01/24  1:16 PM   Specimen: BLOOD RIGHT ARM  Result Value Ref Range Status   Specimen Description BLOOD RIGHT ARM  Final   Special Requests   Final    BOTTLES DRAWN AEROBIC ONLY Blood Culture adequate volume   Culture   Final    NO GROWTH < 24 HOURS Performed at [facility], [address] [place], [place] [address]    Report Status PENDING  Incomplete     Labs: ProBNP, BNP (last 5 results) Recent Labs    12/30/23 0242  PROBNP >35,000.0*   Basic Metabolic Panel: Recent Labs  Lab 12/30/23 0242 12/30/23 0257 12/30/23 0953 12/31/23 0307 01/01/24 0231  NA 140 140  --  134* 134*  K 3.5 3.3*  --  4.0 4.3  CL 96* 101  --  94* 94*  CO2 21*  --   --  24 22  GLUCOSE 79 74  --  58* 80  BUN 39* 38*  --  33* 52*  CREATININE 8.69* 9.00* 9.18* 6.58* 8.18*  CALCIUM  9.9  --   --  8.7* 8.3*  PHOS  --   --   --  5.1*  --    Liver Function Tests: Recent Labs  Lab 12/30/23 0242  AST 41  ALT 13  ALKPHOS 54  BILITOT 0.9  PROT 8.9*  ALBUMIN  3.7   No results for input(s): LIPASE, AMYLASE in the last 168 hours. No results for input(s): AMMONIA in the last 168 hours. CBC: Recent Labs  Lab 12/30/23 0242 12/30/23 0257 12/30/23 0953 12/31/23 0307  WBC 8.8  --  9.4 9.5  NEUTROABS 7.1  --   --   --  HGB 11.4* 13.6 10.1* 10.3*  HCT 37.0 40.0 33.2* 33.2*  MCV 100.3*  --  101.5* 95.7  PLT 140*  --  136* 140*   Cardiac Enzymes: No results for input(s): CKTOTAL, CKMB, CKMBINDEX, TROPONINI, TROPONINIHS in the last 168 hours. BNP: No results for input(s): BNP in the last 168 hours. CBG: No results for input(s): GLUCAP in the last 168 hours. D-Dimer No results for input(s): DDIMER in the last 72 hours. Hgb A1c No results for input(s): HGBA1C in the last 72 hours. Lipid Profile No results for input(s): CHOL,  HDL, LDLCALC, TRIG, CHOLHDL, LDLDIRECT in the last 72 hours. Thyroid function studies No results for input(s): TSH, T4TOTAL, FREET4, T3FREE, THYROIDAB in the last 72 hours.  Invalid input(s): FREET3 Anemia work up No results for input(s): VITAMINB12, FOLATE, FERRITIN, TIBC, IRON, RETICCTPCT in the last 72 hours. Urinalysis    Component Value Date/Time   COLORURINE YELLOW 03/14/2020 0300   APPEARANCEUR CLOUDY (A) 03/14/2020 0300   LABSPEC 1.023 03/14/2020 0300   PHURINE 8.0 03/14/2020 0300   GLUCOSEU 50 (A) 03/14/2020 0300   HGBUR SMALL (A) 03/14/2020 0300   BILIRUBINUR NEGATIVE 03/14/2020 0300   KETONESUR 5 (A) 03/14/2020 0300   PROTEINUR >=300 (A) 03/14/2020 0300   NITRITE NEGATIVE 03/14/2020 0300   LEUKOCYTESUR NEGATIVE 03/14/2020 0300   Sepsis Labs Recent Labs  Lab 12/30/23 0242 12/30/23 0953 12/31/23 0307  WBC 8.8 9.4 9.5    Procedures/Studies: DG Chest Port 1 View Result Date: 12/30/2023 EXAM: 1 VIEW(S) XRAY OF THE CHEST 12/30/2023 02:57:43 AM COMPARISON: None available. CLINICAL HISTORY: sob, fever FINDINGS: LUNGS AND PLEURA: Nodular opacity within the left upper lung zone appears new from prior examination and may be infectious or inflammatory in the acute setting. Follow-up chest radiograph is recommended in 3-4 weeks, following conservative therapy, to document resolution. If persistent at that time, dedicated contrast enhanced CT imaging of the chest is recommended for further evaluation. No pleural effusion. No pneumothorax. HEART AND MEDIASTINUM: Cardiomegaly. Aortic arch calcifications. BONES AND SOFT TISSUES: No acute osseous abnormality. IMPRESSION: 1. Nodular opacity within the left upper lung zone, possibly infectious or inflammatory; follow-up chest radiograph is recommended in 3-4 weeks, following conservative therapy, to document resolution, and if persistent at that time, dedicated contrast enhanced CT imaging of the chest is  recommended for further evaluation. 2. Cardiomegaly and aortic arch calcifications. Electronically signed by: Dorethia Molt MD 12/30/2023 03:29 AM EST RP Workstation: [id number]    Time coordinating discharge: 40 mins  SIGNED:  Norval Bar, MD [facility] 01/02/2024, 10:53 AM     [1]  Allergies Allergen Reactions   Bee Venom Anaphylaxis   Shellfish Protein-Containing Drug Products Anaphylaxis, Swelling and Other (See Comments)    Sweat and Dizzy. Lobster    Azithromycin  Hives    hives   Amoxicillin     Shortness of breath    Darbepoetin Alfa  Other (See Comments)   Egg Solids, Whole     Other Reaction(s): GI Intolerance   Iodinated Contrast Media     Other Reaction(s): Unknown   Bidil  [Isosorb Dinitrate-Hydralazine ]     headache   Chlorhexidine  Rash   Egg Protein-Containing Drug Products Nausea Only   Erythromycin Nausea Only and Other (See Comments)    Cramps    Hydralazine      headache   Iodine Rash and Other (See Comments)    Arm flares up per pt   Penicillins Rash    Has patient had a PCN reaction causing immediate rash, facial/tongue/throat  swelling, SOB or lightheadedness with hypotension: no  Has patient had a PCN reaction causing severe rash involving mucus membranes or skin necrosis: yes  Has patient had a PCN reaction that required hospitalization: no  Has patient had a PCN reaction occurring within the last 10 years: no  If all of the above answers are NO, then may proceed with Cephalosporin use.  Other Reaction(s): GI Intolerance   "

## 2024-01-02 NOTE — TOC Transition Note (Signed)
 Transition of Care [facility]) - Discharge Note   Patient Details  Name: Barbara Crawford MRN: [id number] Date of Birth: [birth year]  [facility] CM/SW Contact:  Marval Gell, RN Phone Number: 01/02/2024, 12:00 PM   Clinical Narrative:     Notified Burnard w [facility] that patient will DC today   Final next level of care: Home w Home Health Services Barriers to Discharge: No Barriers Identified   Patient Goals and CMS Choice   CMS Medicare.gov Compare Post Acute Care list provided to:: Patient Choice offered to / list presented to : Patient      Discharge Placement                       Discharge Plan and Services Additional resources added to the After Visit Summary for     Discharge Planning Services: CM Consult Post Acute Care Choice: Home Health                    HH Arranged: PT [facility] Agency: [facility] Date [facility] Agency Contacted: 01/02/24 Time HH Agency Contacted: 1200 Representative spoke with at [facility] Agency: Burnard  Social Drivers of Health (SDOH) Interventions SDOH Screenings   Food Insecurity: No Food Insecurity (12/30/2023)  Housing: Low Risk (12/30/2023)  Transportation Needs: No Transportation Needs (12/30/2023)  Utilities: Not At Risk (12/30/2023)  Depression (PHQ2-9): Low Risk (03/03/2023)  Financial Resource Strain: Low Risk (04/04/2021)   Received from [facility]  Physical Activity: Unknown (04/04/2021)   Received from [facility] visits prior to 03/05/2022., [facility]  Social Connections: Moderately Isolated (12/30/2023)  Stress: No Stress Concern Present (04/04/2021)   Received from [facility] visits prior to 03/05/2022., [facility]  Tobacco Use: Low Risk (12/30/2023)     Readmission Risk Interventions     No data to display

## 2024-01-02 NOTE — Progress Notes (Addendum)
 D/C order noted. Contacted nephrologist to inquire if pt would be appropriate for out-pt HD later today or tomorrow since d/c order is already in and to avoid HD on day of d/c. Nephrologist to assess pt and will provide update.   Randine Mungo Dialysis Navigator [phone]  Addendum at 1:40 pm: Pt receiving inpt HD here today prior to d/c. Contacted [facility] and spoke to Marcey, Consulting Civil Engineer. Marcey states he spoke to pt yesterday and pt aware she needs to come to clinic tomorrow for treatment due to holiday schedule. Marcey advised of nephrology rec of pt's dry weight being lowered by 1kg. D/C summary and most recent renal note faxed to clinic for continuation of care.   Addendum at 4:25 pm: Today's renal note faxed to [facility] this afternoon.

## 2024-01-02 NOTE — Progress Notes (Signed)
" °   01/02/24 1821  Vitals  Temp 98.4 F (36.9 C)  Pulse Rate 85  Resp 20  BP (!) 131/56  SpO2 100 %  O2 Device Nasal Cannula  Type of Weight Post-Dialysis  Oxygen  Therapy  O2 Flow Rate (L/min) 2 L/min  Post Treatment  Dialyzer Clearance Heavily streaked  Liters Processed 63  Fluid Removed (mL) 2000 mL  Tolerated HD Treatment Yes  Post-Hemodialysis Comments Pt. tolerated tx well  AVG/AVF Arterial Site Held (minutes) 10 minutes  AVG/AVF Venous Site Held (minutes) 10 minutes    "

## 2024-01-02 NOTE — Progress Notes (Signed)
 [facility] NEPHROLOGY PROGRESS NOTE  Assessment/ Plan: Pt is a 68 y.o. yo female with ESRD on HD TTS, HTN, CAD, HFrEF, chronic respiratory failure on 2L [place] who is admitted with pneumonia.    # Sepsis 2/2 PNA.   - Currently on ceftriaxone  and doxycycline .   - Antibiotics per primary team, follow cultures; 12/27 blood culture with staph epidermidis in 1/2 bottles  - repeat blood cultures are NGTD - 1/2 initial sets positive and felt likely to be contaminant  #ESRD   - HD today and then going to outpatient HD at her regular unit tomorrow (modified due to holiday).  Usually HD per TTS schedule  - would lower EDW to 54.1 kg and follow trends as may need to further adjust (note that if accurate this is a pre-weight from today)  #Hypertension/volume:   - controlled on current regimen - optimizing volume with HD.  Note that she left 1 kg below her EDW; it would seem that volume is less of an issue. Lower EDW as above - continue home medications    #Anemia of CKD. Hgb acceptable; follow for now   #CKD-Metabolic bone disease.  Mild hyperphos - at goal   #Chronic resp failure. 2L [place] at baseline.   Disposition - per primary team     Subjective:  She had three unmeasured urine voids over 12/29 charted.  Seen and examined on dialysis.  Procedure supervised.  Blood pressure 133/62 and HR 85.  Tolerating goal.  Left AV graft in use.  She states she is going home today after HD.  States that she is dialyzing tomorrow at her outpatient unit due to holiday schedule.  She feels like she's lost weight because hasn't had an appetite.    Review of systems:    Reports her shortness of breath has improved Denies n/v No cp     Objective Vital signs in last 24 hours: Vitals:   01/02/24 0714 01/02/24 0855 01/02/24 1053 01/02/24 1336  BP: 125/66  125/67 134/62  Pulse: 72 77 77 79  Resp: 18  18 (!) 23  Temp: (!) 97.4 F (36.3 C)  (!) 97.5 F (36.4 C) 97.8 F (36.6 C)  TempSrc:  Oral  Oral   SpO2: 99% 100% 100% 100%  Weight:    54.1 kg  Height:       Weight change:   Intake/Output Summary (Last 24 hours) at 01/02/2024 1414 Last data filed at 01/02/2024 1304 Gross per 24 hour  Intake 460 ml  Output --  Net 460 ml     Labs: RENAL PANEL Recent Labs  Lab 12/30/23 0242 12/30/23 0257 12/30/23 0953 12/31/23 0307 01/01/24 0231  NA 140 140  --  134* 134*  K 3.5 3.3*  --  4.0 4.3  CL 96* 101  --  94* 94*  CO2 21*  --   --  24 22  GLUCOSE 79 74  --  58* 80  BUN 39* 38*  --  33* 52*  CREATININE 8.69* 9.00* 9.18* 6.58* 8.18*  CALCIUM  9.9  --   --  8.7* 8.3*  PHOS  --   --   --  5.1*  --   ALBUMIN  3.7  --   --   --   --     Liver Function Tests: Recent Labs  Lab 12/30/23 0242  AST 41  ALT 13  ALKPHOS 54  BILITOT 0.9  PROT 8.9*  ALBUMIN  3.7   No results for input(s): LIPASE, AMYLASE  in the last 168 hours. No results for input(s): AMMONIA in the last 168 hours. CBC: Recent Labs    10/27/23 0038 12/30/23 0242 12/30/23 0257 12/30/23 0953 12/31/23 0307  HGB 10.1* 11.4* 13.6 10.1* 10.3*  MCV 98.2 100.3*  --  101.5* 95.7    Cardiac Enzymes: No results for input(s): CKTOTAL, CKMB, CKMBINDEX, TROPONINI in the last 168 hours. CBG: No results for input(s): GLUCAP in the last 168 hours.  Iron Studies: No results for input(s): IRON, TIBC, TRANSFERRIN, FERRITIN in the last 72 hours. Studies/Results: No results found.   Medications: Infusions:  anticoagulant sodium citrate     cefTRIAXone  (ROCEPHIN )  IV Stopped (01/02/24 0853)    Scheduled Medications:  aspirin  EC  81 mg Oral Daily   doxycycline   100 mg Oral Q12H   feeding supplement (NEPRO CARB STEADY)  237 mL Oral BID BM   heparin   5,000 Units Subcutaneous Q8H   metoprolol  succinate  25 mg Oral Daily   multivitamin  1 tablet Oral QHS    have reviewed scheduled and prn medications.  Physical Exam:    General adult female in bed in no acute distress HEENT  normocephalic atraumatic extraocular movements intact sclera anicteric Neck supple trachea midline Lungs decreased breath sounds and occ coarse breath sounds; normal work of breathing at rest on 2 liters; occ wet-sounding cough Heart S1S2 no rub Abdomen soft nontender nondistended Extremities trace edema lower extremities Psych normal mood and affect Neuro alert and oriented x 3 provides hx and follows commands  Access LUE AVG in use   Outpatient Dialysis Orders:  [facility]  Access. L arm AVG 2k/2.5 ca F180 dialyzer EDW 55.5 kg  BF 400 DF 600 No heparin   3.5 hours  Last outpatient HD on 12/24 - post weight was 54.5 kg (1 kg below EDW) Meds: 5600 units of epo each tx Zemplar 4.6 mcg per tx   Barbara Crawford 01/02/2024,2:44 PM  LOS: 3 days

## 2024-01-02 NOTE — Progress Notes (Signed)
 This chaplain responded to Chaplain Jerry's referral for notarizing the Pt. Advance Directive:  HCPOA only. The Pt. is not completing a Living Will.  The Pt. is awake and able to answer clarifying questions after AD education. Family is not at the bedside.  The Pt. named Barbara Crawford as her healthcare agent. If this person is unable or unwilling to serve as her healthcare agent, the Pt. next choice is [name].  The chaplain is present with the Pt., notary, and witnesses for the notarizing of the Pt. AD. The chaplain gave the Pt. the original AD along with two copies. The chaplain scanned the Pt. AD into the Pt. EMR.  This chaplain is available for F/U spiritual care as needed.  Chaplain Leeroy Hummer [phone]

## 2024-01-02 NOTE — Progress Notes (Signed)
 Mobility Specialist Progress Note:    01/02/24 0948  Mobility  Activity Ambulated with assistance  Level of Assistance Standby assist, set-up cues, supervision of patient - no hands on  Assistive Device Front wheel walker  Distance Ambulated (ft) 50 ft  Range of Motion/Exercises Active  Activity Response Tolerated fair  Mobility Referral Yes  Mobility visit 1 Mobility  Mobility Specialist Start Time (ACUTE ONLY) [id number]  Mobility Specialist Stop Time (ACUTE ONLY) 1023  Mobility Specialist Time Calculation (min) (ACUTE ONLY) 35 min   Received pt sitting EOB agreeable to session. No c/o any symptoms. Pt able to stand w/ light CGA and ambulate w/ SV. Pt fatigue fairly quickly but aware of when she needs to turn around. Returned pt to room requesting some exercises to perform before ending session. Left pt w/ all needs met.   Venetia Keel Mobility Specialist Please Neurosurgeon or Rehab Office at [phone]

## 2024-01-02 NOTE — Progress Notes (Signed)
 Pt. Came in awake and oriented, hooked on 02 at 2LPM. Consent verified and on file.  Started with no complaints  UF removal: Tx duration: 3.5 hours  Access used: Left AVG Access issue: High venous pressure, needed to decrease BFR to 365ml/L  Tx completed and tolerated. Pressure dressing applied. Endorsed to floor nurse. Transported to room.  Barbara Crawford Kegan Shepardson, RN [facility]

## 2024-01-02 NOTE — Progress Notes (Signed)
" °   01/02/24 1841  Vitals  Temp 97.7 F (36.5 C)  Temp Source Oral  BP 126/63  MAP (mmHg) 80  BP Location Right Arm  BP Method Automatic  Patient Position (if appropriate) Lying  Pulse Rate 91  Pulse Rate Source Monitor  ECG Heart Rate 89  Resp 20  Level of Consciousness  Level of Consciousness Alert  MEWS COLOR  MEWS Score Color Green  Oxygen  Therapy  SpO2 100 %  O2 Device Nasal Cannula  O2 Flow Rate (L/min) 2 L/min  Pain Assessment  Pain Scale 0-10  Pain Score 0  MEWS Score  MEWS Temp 0  MEWS Systolic 0  MEWS Pulse 0  MEWS RR 0  MEWS LOC 0  MEWS Score 0    Patient arrived back from HD, asymptomatic, via transport. Call bell within reach. RN explained patient has discharge orders and can initiate the discharge process. Patient requests to be discharge after she eats dinner. Patient didn't allow RN to remove PIV.  "

## 2024-01-02 NOTE — Progress Notes (Incomplete)
 The patient needs dialysis and will discharge after hemo.Thanks Altamese

## 2024-01-02 NOTE — Plan of Care (Signed)
  Problem: Clinical Measurements: Goal: Diagnostic test results will improve Outcome: Progressing Goal: Respiratory complications will improve Outcome: Progressing   Problem: Activity: Goal: Risk for activity intolerance will decrease Outcome: Progressing   

## 2024-01-04 LAB — CULTURE, BLOOD (ROUTINE X 2)
Culture: NO GROWTH
Special Requests: ADEQUATE

## 2024-01-06 LAB — CULTURE, BLOOD (ROUTINE X 2)
Culture: NO GROWTH
Culture: NO GROWTH
Special Requests: ADEQUATE
Special Requests: ADEQUATE

## 2024-01-08 ENCOUNTER — Encounter: Admitting: Dietician

## 2024-01-10 ENCOUNTER — Ambulatory Visit: Payer: Self-pay | Admitting: Internal Medicine

## 2024-01-10 ENCOUNTER — Ambulatory Visit (HOSPITAL_COMMUNITY)
Admission: RE | Admit: 2024-01-10 | Discharge: 2024-01-10 | Disposition: A | Discharge status: Home / Self Care | Payer: Self-pay | Source: Ambulatory Visit | Attending: Internal Medicine | Admitting: Internal Medicine

## 2024-01-10 DIAGNOSIS — I361 Nonrheumatic tricuspid (valve) insufficiency: Secondary | ICD-10-CM | POA: Diagnosis present

## 2024-01-10 LAB — ECHOCARDIOGRAM COMPLETE
Area-P 1/2: 3.72 cm2
MV M vel: 4.64 m/s
MV Peak grad: 86.1 mmHg
S' Lateral: 4.6 cm

## 2024-01-11 NOTE — Telephone Encounter (Signed)
 Spoke with the patient. She cancelled her previous HF consult.  Rescheduled her with Dr. Zenaida for CHF consult on 2/4. She was grateful for call and agreed with plan.

## 2024-01-18 ENCOUNTER — Encounter (HOSPITAL_COMMUNITY): Payer: Self-pay | Admitting: Internal Medicine

## 2024-01-18 ENCOUNTER — Inpatient Hospital Stay (HOSPITAL_COMMUNITY)
Admission: EM | Admit: 2024-01-18 | Discharge: 2024-01-30 | DRG: 480 | Disposition: A | Discharge status: Rehabilitation Facility | Attending: Internal Medicine | Admitting: Internal Medicine

## 2024-01-18 ENCOUNTER — Emergency Department (HOSPITAL_COMMUNITY)

## 2024-01-18 DIAGNOSIS — I132 Hypertensive heart and chronic kidney disease with heart failure and with stage 5 chronic kidney disease, or end stage renal disease: Secondary | ICD-10-CM | POA: Diagnosis present

## 2024-01-18 DIAGNOSIS — I5020 Unspecified systolic (congestive) heart failure: Secondary | ICD-10-CM | POA: Insufficient documentation

## 2024-01-18 DIAGNOSIS — N186 End stage renal disease: Secondary | ICD-10-CM | POA: Diagnosis present

## 2024-01-18 DIAGNOSIS — Z6820 Body mass index (BMI) 20.0-20.9, adult: Secondary | ICD-10-CM

## 2024-01-18 DIAGNOSIS — F411 Generalized anxiety disorder: Secondary | ICD-10-CM | POA: Diagnosis present

## 2024-01-18 DIAGNOSIS — S72002A Fracture of unspecified part of neck of left femur, initial encounter for closed fracture: Secondary | ICD-10-CM | POA: Diagnosis not present

## 2024-01-18 DIAGNOSIS — Z8249 Family history of ischemic heart disease and other diseases of the circulatory system: Secondary | ICD-10-CM | POA: Diagnosis not present

## 2024-01-18 DIAGNOSIS — E875 Hyperkalemia: Secondary | ICD-10-CM | POA: Diagnosis present

## 2024-01-18 DIAGNOSIS — I5022 Chronic systolic (congestive) heart failure: Secondary | ICD-10-CM | POA: Diagnosis present

## 2024-01-18 DIAGNOSIS — I5021 Acute systolic (congestive) heart failure: Secondary | ICD-10-CM | POA: Diagnosis not present

## 2024-01-18 DIAGNOSIS — Z9981 Dependence on supplemental oxygen: Secondary | ICD-10-CM

## 2024-01-18 DIAGNOSIS — Z79899 Other long term (current) drug therapy: Secondary | ICD-10-CM | POA: Diagnosis not present

## 2024-01-18 DIAGNOSIS — I2510 Atherosclerotic heart disease of native coronary artery without angina pectoris: Secondary | ICD-10-CM | POA: Diagnosis present

## 2024-01-18 DIAGNOSIS — I10 Essential (primary) hypertension: Secondary | ICD-10-CM | POA: Diagnosis present

## 2024-01-18 DIAGNOSIS — J9611 Chronic respiratory failure with hypoxia: Secondary | ICD-10-CM | POA: Diagnosis present

## 2024-01-18 DIAGNOSIS — R051 Acute cough: Secondary | ICD-10-CM | POA: Diagnosis not present

## 2024-01-18 DIAGNOSIS — Z881 Allergy status to other antibiotic agents status: Secondary | ICD-10-CM

## 2024-01-18 DIAGNOSIS — Z888 Allergy status to other drugs, medicaments and biological substances status: Secondary | ICD-10-CM

## 2024-01-18 DIAGNOSIS — E876 Hypokalemia: Secondary | ICD-10-CM | POA: Diagnosis not present

## 2024-01-18 DIAGNOSIS — Z7982 Long term (current) use of aspirin: Secondary | ICD-10-CM | POA: Diagnosis not present

## 2024-01-18 DIAGNOSIS — Z91013 Allergy to seafood: Secondary | ICD-10-CM

## 2024-01-18 DIAGNOSIS — Z833 Family history of diabetes mellitus: Secondary | ICD-10-CM

## 2024-01-18 DIAGNOSIS — E559 Vitamin D deficiency, unspecified: Secondary | ICD-10-CM | POA: Diagnosis present

## 2024-01-18 DIAGNOSIS — Z681 Body mass index (BMI) 19 or less, adult: Secondary | ICD-10-CM

## 2024-01-18 DIAGNOSIS — Z992 Dependence on renal dialysis: Secondary | ICD-10-CM | POA: Diagnosis not present

## 2024-01-18 DIAGNOSIS — F32A Depression, unspecified: Secondary | ICD-10-CM | POA: Diagnosis present

## 2024-01-18 DIAGNOSIS — Z87891 Personal history of nicotine dependence: Secondary | ICD-10-CM

## 2024-01-18 DIAGNOSIS — Z8679 Personal history of other diseases of the circulatory system: Secondary | ICD-10-CM | POA: Diagnosis not present

## 2024-01-18 DIAGNOSIS — N2581 Secondary hyperparathyroidism of renal origin: Secondary | ICD-10-CM | POA: Diagnosis present

## 2024-01-18 DIAGNOSIS — Z91041 Radiographic dye allergy status: Secondary | ICD-10-CM

## 2024-01-18 DIAGNOSIS — Z880 Allergy status to penicillin: Secondary | ICD-10-CM

## 2024-01-18 DIAGNOSIS — S72012A Unspecified intracapsular fracture of left femur, initial encounter for closed fracture: Secondary | ICD-10-CM | POA: Diagnosis present

## 2024-01-18 DIAGNOSIS — Z91030 Bee allergy status: Secondary | ICD-10-CM | POA: Diagnosis not present

## 2024-01-18 DIAGNOSIS — S7292XA Unspecified fracture of left femur, initial encounter for closed fracture: Secondary | ICD-10-CM | POA: Diagnosis present

## 2024-01-18 DIAGNOSIS — W1830XA Fall on same level, unspecified, initial encounter: Secondary | ICD-10-CM | POA: Diagnosis present

## 2024-01-18 DIAGNOSIS — D631 Anemia in chronic kidney disease: Secondary | ICD-10-CM | POA: Diagnosis present

## 2024-01-18 DIAGNOSIS — N185 Chronic kidney disease, stage 5: Secondary | ICD-10-CM | POA: Diagnosis not present

## 2024-01-18 DIAGNOSIS — S728X2A Other fracture of left femur, initial encounter for closed fracture: Secondary | ICD-10-CM | POA: Diagnosis not present

## 2024-01-18 DIAGNOSIS — R509 Fever, unspecified: Secondary | ICD-10-CM | POA: Diagnosis not present

## 2024-01-18 DIAGNOSIS — E440 Moderate protein-calorie malnutrition: Secondary | ICD-10-CM | POA: Diagnosis present

## 2024-01-18 DIAGNOSIS — Z91012 Allergy to eggs, unspecified: Secondary | ICD-10-CM

## 2024-01-18 DIAGNOSIS — Z751 Person awaiting admission to adequate facility elsewhere: Secondary | ICD-10-CM

## 2024-01-18 MED ORDER — ACETAMINOPHEN 325 MG PO TABS: 650.0000 mg | ORAL_TABLET | Freq: Four times a day (QID) | ORAL | Status: DC | PRN

## 2024-01-18 MED ORDER — FERRIC CITRATE 1 GM 210 MG(FE) PO TABS
420.0000 mg | ORAL_TABLET | Freq: Three times a day (TID) | ORAL | Status: DC
  Filled 2024-01-18 (×36): qty 2

## 2024-01-18 MED ORDER — HEPARIN SODIUM (PORCINE) 5000 UNIT/ML IJ SOLN
5000.0000 [IU] | Freq: Three times a day (TID) | INTRAMUSCULAR | Status: DC
  Administered 2024-01-19: 5000 [IU] via SUBCUTANEOUS
  Filled 2024-01-18: qty 1

## 2024-01-18 MED ORDER — METHOCARBAMOL 1000 MG/10ML IJ SOLN: 500.0000 mg | Freq: Four times a day (QID) | INTRAMUSCULAR | Status: DC | PRN

## 2024-01-18 MED ORDER — SODIUM CHLORIDE 0.9% FLUSH
3.0000 mL | Freq: Two times a day (BID) | INTRAVENOUS | Status: DC
  Administered 2024-01-19 – 2024-01-30 (×20): 3 mL via INTRAVENOUS

## 2024-01-18 MED ORDER — METOPROLOL SUCCINATE ER 25 MG PO TB24
25.0000 mg | ORAL_TABLET | Freq: Every day | ORAL | Status: DC
  Administered 2024-01-19 – 2024-01-29 (×10): 25 mg via ORAL
  Filled 2024-01-18 (×10): qty 1

## 2024-01-18 MED ORDER — ACETAMINOPHEN 500 MG PO TABS
500.0000 mg | ORAL_TABLET | Freq: Once | ORAL | Status: AC
  Administered 2024-01-18: 500 mg via ORAL
  Filled 2024-01-18: qty 1

## 2024-01-18 MED ORDER — FENTANYL CITRATE (PF) 50 MCG/ML IJ SOSY: 12.5000 ug | PREFILLED_SYRINGE | INTRAMUSCULAR | Status: DC | PRN

## 2024-01-18 MED ORDER — ACETAMINOPHEN 500 MG PO TABS
1000.0000 mg | ORAL_TABLET | Freq: Once | ORAL | Status: DC
  Filled 2024-01-18: qty 2

## 2024-01-18 MED ORDER — FERRIC CITRATE 1 GM 210 MG(FE) PO TABS: 210.0000 mg | ORAL_TABLET | ORAL | Status: DC | PRN

## 2024-01-18 MED ORDER — SODIUM CHLORIDE 0.9% FLUSH
3.0000 mL | Freq: Two times a day (BID) | INTRAVENOUS | Status: DC
  Administered 2024-01-19 – 2024-01-28 (×14): 3 mL via INTRAVENOUS

## 2024-01-18 MED ORDER — RENA-VITE PO TABS
1.0000 | ORAL_TABLET | Freq: Every day | ORAL | Status: DC
  Administered 2024-01-19 – 2024-01-29 (×7): 1 via ORAL
  Filled 2024-01-18 (×8): qty 1

## 2024-01-18 MED ORDER — ONDANSETRON HCL 4 MG/2ML IJ SOLN: 4.0000 mg | Freq: Four times a day (QID) | INTRAMUSCULAR | Status: DC | PRN

## 2024-01-18 MED ORDER — SODIUM CHLORIDE 0.9% FLUSH: 3.0000 mL | INTRAVENOUS | Status: DC | PRN

## 2024-01-18 MED ORDER — ONDANSETRON HCL 4 MG PO TABS: 4.0000 mg | ORAL_TABLET | Freq: Four times a day (QID) | ORAL | Status: DC | PRN

## 2024-01-18 MED ORDER — SODIUM CHLORIDE 0.9 % IV SOLN: 250.0000 mL | INTRAVENOUS | Status: AC | PRN

## 2024-01-18 NOTE — H&P (Signed)
 " History and Physical    Barbara Crawford FMW:[id number] DOB: 08/13/1955 DOA: 01/18/2024  PCP: Tammy Tari DASEN, PA-C   Patient coming from: Home   Chief Complaint: Ground-level fall  ED TRIAGE note:PT BIB EMS complaining L hip and leg pain from a fall in a vehicle while returning from dialysis earlier today. Took 500 mg of tylenol  before calling EMS. Wears 3L [place] at home   EMS VS 132/80 79 HR 99% 3L  HPI:  Barbara Crawford is a 68 y.o. female with medical history significant of ESRD on dialysis TTS schedule, chronic hypoxic respiratory failure 2 L oxygen  at baseline, HFrEF 30 to 35%, CAD, essential hypertension, generalized anxiety disorder and chronic dyspnea presented to emergency department complaining of left-sided leg pain and hip pain after falling from vehicle returning from dialysis unit today.  Patient denies any head injury and loss of consciousness.  Denies any chest pain, palpitation shortness of breath.  No other complaint at this time.  Patient's daughter at the bedside reported that for the pain control they do not prefer any kind of narcotics however they are agreeable to manage pain with oral or IV Tylenol .  ED Course:  At presentation to ED patient is hemodynamically stable. Pending lab work include BMP CMP.  X-ray femur showed acute impacted subcapital left femoral neck fracture. X-ray pelvis showed  Impacted subcapital left femoral neck fracture.and degenerative changes of the right hip.   EDP discussed case with orthopedic surgeon Dr. Sharl recommended CT he for assessment for treatment options, n.p.o. after midnight and orthopedics will do formal consult in the a.m.  Pending CT scan of the left hip joint.  Hospitalist consulted for further management of left-sided hip fracture and chronic medical management.  Significant labs in the ED: Lab Orders         Basic metabolic panel         CBC with Differential         CBC         Comprehensive  metabolic panel       Review of Systems:  Review of Systems  Constitutional:  Negative for chills, fever, malaise/fatigue and weight loss.  Respiratory:  Negative for shortness of breath.   Cardiovascular:  Negative for chest pain.  Gastrointestinal:  Negative for abdominal pain.  Musculoskeletal:  Positive for falls and joint pain.  Neurological:  Negative for dizziness, weakness and headaches.    Past Medical History:  Diagnosis Date   Anxiety    ESRD (end stage renal disease) on dialysis (HCC)    Heart failure with mildly reduced ejection fraction (HFmrEF) (HCC)    Hypertension     Past Surgical History:  Procedure Laterality Date   AV FISTULA PLACEMENT Left 03/13/2020   Procedure: Creation of Left Brachiocephalic fistula;  Surgeon: Gretta Lonni PARAS, MD;  Location: [facility] OR;  Service: Vascular;  Laterality: Left;   IR FLUORO GUIDE CV LINE RIGHT  02/26/2020   IR FLUORO GUIDE CV LINE RIGHT  03/09/2020   IR US  GUIDE VASC ACCESS RIGHT  02/26/2020   IR US  GUIDE VASC ACCESS RIGHT  03/09/2020   RIGHT HEART CATH N/A 05/12/2023   Procedure: RIGHT HEART CATH;  Surgeon: Wendel Lurena POUR, MD;  Location: [facility] INVASIVE CV LAB;  Service: Cardiovascular;  Laterality: N/A;   RIGHT/LEFT HEART CATH AND CORONARY ANGIOGRAPHY N/A 10/07/2021   Procedure: RIGHT/LEFT HEART CATH AND CORONARY ANGIOGRAPHY;  Surgeon: Jordan, Peter M, MD;  Location: [facility] INVASIVE CV LAB;  Service: Cardiovascular;  Laterality: N/A;     reports that she has never smoked. She has never used smokeless tobacco. She reports that she does not currently use alcohol. She reports that she does not currently use drugs after having used the following drugs: Marijuana.  Allergies[1]  Family History  Problem Relation Age of Onset   Dementia Mother    Diabetes Sister    Hypertension Son     Prior to Admission medications  Medication Sig Start Date End Date Taking? Authorizing Provider  acetaminophen  (TYLENOL ) 500 MG tablet Take 500 mg by  mouth as needed for mild pain, moderate pain or fever.    [provider]  aspirin  EC 81 MG tablet Take 81 mg by mouth daily.    [provider]  diphenhydrAMINE  (BENADRYL ) 2 % cream Apply 1 Application topically 2 (two) times daily as needed for itching.    [provider]  ferric citrate  (AURYXIA ) 1 GM 210 MG(Fe) tablet Take 210-420 mg by mouth See admin instructions. 420 mg with meals, 210 mg with snacks    [provider]  guaiFENesin -dextromethorphan  (ROBITUSSIN DM) 100-10 MG/5ML syrup Take 10 mLs by mouth every 4 (four) hours as needed for cough. 01/02/24   Cosette Blackwater, MD  LORazepam  (ATIVAN ) 0.5 MG tablet Take 0.5 mg by mouth 2 (two) times daily as needed for anxiety. 10/18/21   [provider]  metoprolol  succinate (TOPROL -XL) 25 MG 24 hr tablet Take 1 tablet (25 mg total) by mouth daily. 11/07/23   Thukkani, Arun K, MD  multivitamin (RENA-VIT) TABS tablet Take 1 tablet by mouth at bedtime.    [provider]  OXYGEN  Inhale 2 L/min into the lungs continuous.    [provider]     Physical Exam: Vitals:   01/18/24 2124  BP: (!) 114/59  Pulse: 81  Resp: 18  Temp: 98.6 F (37 C)  TempSrc: Oral  SpO2: 100%    Physical Exam Vitals reviewed.  Constitutional:      General: She is not in acute distress.    Appearance: She is not ill-appearing.  HENT:     Mouth/Throat:     Mouth: Mucous membranes are moist.  Eyes:     Pupils: Pupils are equal, round, and reactive to light.  Cardiovascular:     Rate and Rhythm: Normal rate and regular rhythm.     Pulses: Normal pulses.     Heart sounds: Normal heart sounds.  Pulmonary:     Effort: Pulmonary effort is normal.     Breath sounds: Normal breath sounds.  Abdominal:     Palpations: Abdomen is soft.  Musculoskeletal:     Cervical back: Neck supple.  Skin:    Capillary Refill: Capillary refill takes less than 2 seconds.  Neurological:     Mental Status: She is  alert and oriented to person, place, and time.  Psychiatric:        Mood and Affect: Mood normal.      Labs on Admission: I have personally reviewed following labs and imaging studies  CBC: No results for input(s): WBC, NEUTROABS, HGB, HCT, MCV, PLT in the last 168 hours. Basic Metabolic Panel: No results for input(s): NA, K, CL, CO2, GLUCOSE, BUN, CREATININE, CALCIUM , MG, PHOS in the last 168 hours. GFR: CrCl cannot be calculated (Unknown ideal weight.). Liver Function Tests: No results for input(s): AST, ALT, ALKPHOS, BILITOT, PROT, ALBUMIN  in the last 168 hours. No results for input(s): LIPASE, AMYLASE in the last 168  hours. No results for input(s): AMMONIA in the last 168 hours. Coagulation Profile: No results for input(s): INR, PROTIME in the last 168 hours. Cardiac Enzymes: No results for input(s): CKTOTAL, CKMB, CKMBINDEX, TROPONINI, TROPONINIHS in the last 168 hours. BNP (last 3 results) No results for input(s): BNP in the last 8760 hours. HbA1C: No results for input(s): HGBA1C in the last 72 hours. CBG: No results for input(s): GLUCAP in the last 168 hours. Lipid Profile: No results for input(s): CHOL, HDL, LDLCALC, TRIG, CHOLHDL, LDLDIRECT in the last 72 hours. Thyroid Function Tests: No results for input(s): TSH, T4TOTAL, FREET4, T3FREE, THYROIDAB in the last 72 hours. Anemia Panel: No results for input(s): VITAMINB12, FOLATE, FERRITIN, TIBC, IRON , RETICCTPCT in the last 72 hours. Urine analysis:    Component Value Date/Time   COLORURINE YELLOW 03/14/2020 0300   APPEARANCEUR CLOUDY (A) 03/14/2020 0300   LABSPEC 1.023 03/14/2020 0300   PHURINE 8.0 03/14/2020 0300   GLUCOSEU 50 (A) 03/14/2020 0300   HGBUR SMALL (A) 03/14/2020 0300   BILIRUBINUR NEGATIVE 03/14/2020 0300   KETONESUR 5 (A) 03/14/2020 0300   PROTEINUR >=300 (A) 03/14/2020 0300   NITRITE NEGATIVE  03/14/2020 0300   LEUKOCYTESUR NEGATIVE 03/14/2020 0300    Radiological Exams on Admission: I have personally reviewed images CT Hip Left Wo Contrast Result Date: 01/18/2024 EXAM: CT OF THE LEFT HIP WITHOUT IV CONTRAST 01/18/2024 11:36:46 PM TECHNIQUE: CT of the left hip was performed without the administration of intravenous contrast. Multiplanar reformatted images are provided for review. Automated exposure control, iterative reconstruction, and/or weight based adjustment of the mA/kV was utilized to reduce the radiation dose to as low as reasonably achievable. COMPARISON: X-ray left hip 01/18/2024 CLINICAL HISTORY: Hip surgical planning FINDINGS: BONES: Acute nondisplaced left subcapital femoral neck fracture. No hip dislocation. No acute fractures of the partially visualized left pelvic bones. No aggressive appearing osseous abnormality or periostitis. SOFT TISSUE: Lipohemarthrosis of the left hip (8:41). No soft tissue mass. JOINT: Lipohemarthrosis of the left hip (8:41). No significant degenerative changes. No osseous erosions. INTRAPELVIC CONTENTS: Limited images of the intrapelvic contents are unremarkable. IMPRESSION: 1. Acute nondisplaced left subcapital femoral neck fracture with associated lipohemarthrosis. Electronically signed by: Morgane Naveau MD 01/18/2024 11:46 PM EST RP Workstation: [id number]   DG Pelvis 1-2 Views Result Date: 01/18/2024 EXAM: 1 or 2 VIEW(S) XRAY OF THE PELVIS 01/18/2024 10:26:00 PM COMPARISON: None available. CLINICAL HISTORY: Hip pain FINDINGS: BONES AND JOINTS: Impacted subcapital left femoral neck fracture is noted. Degenerative changes of the right hip. No malalignment. SOFT TISSUES: Subcutaneous soft tissue edema. IMPRESSION: 1. Impacted subcapital left femoral neck fracture. 2. Degenerative changes of the right hip. Electronically signed by: Oneil Devonshire MD 01/18/2024 10:32 PM EST RP Workstation: [id number]   DG Femur Min 2 Views Left Result Date:  01/18/2024 EXAM: 2 VIEW(S) XRAY OF THE LEFT FEMUR 01/18/2024 10:26:00 PM COMPARISON: None available. CLINICAL HISTORY: Left hip pain Left hip pain Left hip pain Left hip pain Left hip pain FINDINGS: BONES AND JOINTS: Acute impacted subcapital left femoral neck fracture. No malalignment. SOFT TISSUES: Unremarkable. IMPRESSION: 1. Acute impacted subcapital left femoral neck fracture. Electronically signed by: Oneil Devonshire MD 01/18/2024 10:31 PM EST RP Workstation: [id number]      Assessment/Plan: Principal Problem:   Closed left femoral fracture (HCC) Active Problems:   Essential hypertension   ESRD (end stage renal disease) (HCC)   Chronic hypoxic respiratory failure (HCC)   HFrEF (heart failure with reduced ejection fraction) (HCC)   History of CAD (coronary  artery disease)   Generalized anxiety disorder    Assessment and Plan: Left femoral neck fracture -Presented emergency department with complaining of left-sided hip joint pain from ground-level from from the car.  Further workup in the ED revealed left femoral neck fracture.  Physical exam and neurovascularly -At presentation to ED patient is hemodynamically stable. Pending lab work include BMP CMP. - X-ray femur showed acute impacted subcapital left femoral neck fracture. X-ray pelvis showed  Impacted subcapital left femoral neck fracture.and degenerative changes of the right hip. - EDP discussed case with orthopedic surgeon Dr. Sharl recommended CT he for assessment for treatment options, n.p.o. after midnight and orthopedics will do formal consult in the a.m. - Pending CT scan of the left hip joint. -Continue pain control with Tylenol  only per patient and family request. - Keep patient n.p.o. after midnight. -Need to consult PT OT postsurgery   Essential hypertension History of HFrEF - Continue Toprol -XL 25 mg daily  ESRD on dialysis TTS schedule -Last dialysis session 1/15.  Please consult nephrology and daytime for  dialysis on Saturday.  Chronic hypoxic respiratory failure -Stable.  Continue to liter oxygen   History of CAD -Continue Toprol -XL.  Holding aspirin  in the setting of surgical intervention.    DVT prophylaxis:  SQ Heparin  Code Status:  Full Code Diet: NPO. Family Communication:   Family was present at bedside, at the time of interview. Opportunity was given to ask question and all questions were answered satisfactorily.  Disposition Plan: Continue monitor improvement of pain.  Postsurgery need to consult PT and OT Consults: Orthopedic surgery Admission status:   Inpatient, Telemetry bed  Severity of Illness: The appropriate patient status for this patient is INPATIENT. Inpatient status is judged to be reasonable and necessary in order to provide the required intensity of service to ensure the patient's safety. The patient's presenting symptoms, physical exam findings, and initial radiographic and laboratory data in the context of their chronic comorbidities is felt to place them at high risk for further clinical deterioration. Furthermore, it is not anticipated that the patient will be medically stable for discharge from the hospital within 2 midnights of admission.   * I certify that at the point of admission it is my clinical judgment that the patient will require inpatient hospital care spanning beyond 2 midnights from the point of admission due to high intensity of service, high risk for further deterioration and high frequency of surveillance required.DEWAINE    Hiro Vipond, MD [facility]  How to contact the [facility] Attending or Consulting provider 7A - 7P or covering provider during after hours 7P -7A, for this patient.  Check the care team in [facility] and look for a) attending/consulting [facility] provider listed and b) the [facility] team listed Log into [email or web address] and use [facility]'s universal password to access. If you do not have the password, please contact the hospital  operator. Locate the [facility] provider you are looking for under [facility] and page to a number that you can be directly reached. If you still have difficulty reaching the provider, please page the [facility] (Director on Call) for the Hospitalists listed on amion for assistance.  01/19/2024, 12:19 AM          [1]  Allergies Allergen Reactions   Bee Venom Anaphylaxis   Shellfish Protein-Containing Drug Products Anaphylaxis, Swelling and Other (See Comments)    Sweat and Dizzy. Lobster    Azithromycin  Hives    hives   Amoxicillin     Shortness of  breath    Darbepoetin Alfa  Other (See Comments)   Egg Solids, Whole     Other Reaction(s): GI Intolerance   Iodinated Contrast Media     Other Reaction(s): Unknown   Bidil  [Isosorb Dinitrate-Hydralazine ]     headache   Chlorhexidine  Rash   Egg Protein-Containing Drug Products Nausea Only   Erythromycin Nausea Only and Other (See Comments)    Cramps    Hydralazine      headache   Iodine  Rash and Other (See Comments)    Arm flares up per pt   Penicillins Rash    Has patient had a PCN reaction causing immediate rash, facial/tongue/throat swelling, SOB or lightheadedness with hypotension: no  Has patient had a PCN reaction causing severe rash involving mucus membranes or skin necrosis: yes  Has patient had a PCN reaction that required hospitalization: no  Has patient had a PCN reaction occurring within the last 10 years: no  If all of the above answers are NO, then may proceed with Cephalosporin use.  Other Reaction(s): GI Intolerance   "

## 2024-01-18 NOTE — ED Triage Notes (Signed)
 PT BIB EMS complaining L hip and leg pain from a fall in a vehicle while returning from dialysis earlier today. Took 500 mg of tylenol  before calling EMS. Wears 3L [place] at home  EMS VS 132/80 79 HR 99% 3L
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
## 2024-01-19 ENCOUNTER — Inpatient Hospital Stay (HOSPITAL_COMMUNITY): Admitting: Anesthesiology

## 2024-01-19 ENCOUNTER — Inpatient Hospital Stay (HOSPITAL_COMMUNITY)

## 2024-01-19 ENCOUNTER — Encounter (HOSPITAL_COMMUNITY): Payer: Self-pay | Admitting: Internal Medicine

## 2024-01-19 ENCOUNTER — Encounter (HOSPITAL_COMMUNITY)
Admission: EM | Disposition: A | Discharge status: Rehabilitation Facility | Payer: Self-pay | Source: Home / Self Care | Attending: Internal Medicine

## 2024-01-19 ENCOUNTER — Other Ambulatory Visit: Payer: Self-pay

## 2024-01-19 DIAGNOSIS — S72002A Fracture of unspecified part of neck of left femur, initial encounter for closed fracture: Secondary | ICD-10-CM

## 2024-01-19 DIAGNOSIS — N186 End stage renal disease: Secondary | ICD-10-CM | POA: Diagnosis not present

## 2024-01-19 DIAGNOSIS — I10 Essential (primary) hypertension: Secondary | ICD-10-CM | POA: Diagnosis not present

## 2024-01-19 DIAGNOSIS — I132 Hypertensive heart and chronic kidney disease with heart failure and with stage 5 chronic kidney disease, or end stage renal disease: Secondary | ICD-10-CM | POA: Diagnosis not present

## 2024-01-19 DIAGNOSIS — Z8679 Personal history of other diseases of the circulatory system: Secondary | ICD-10-CM | POA: Diagnosis not present

## 2024-01-19 DIAGNOSIS — I5021 Acute systolic (congestive) heart failure: Secondary | ICD-10-CM

## 2024-01-19 DIAGNOSIS — J9611 Chronic respiratory failure with hypoxia: Secondary | ICD-10-CM | POA: Diagnosis not present

## 2024-01-19 HISTORY — PX: HIP PINNING,CANNULATED: SHX1758

## 2024-01-19 LAB — COMPREHENSIVE METABOLIC PANEL WITH GFR
ALT: 8 U/L (ref 0–44)
AST: 23 U/L (ref 15–41)
Albumin: 3.1 g/dL — ABNORMAL LOW (ref 3.5–5.0)
Alkaline Phosphatase: 51 U/L (ref 38–126)
Anion gap: 13 (ref 5–15)
BUN: 21 mg/dL (ref 8–23)
CO2: 24 mmol/L (ref 22–32)
Calcium: 8.9 mg/dL (ref 8.9–10.3)
Chloride: 97 mmol/L — ABNORMAL LOW (ref 98–111)
Creatinine, Ser: 4.99 mg/dL — ABNORMAL HIGH (ref 0.44–1.00)
GFR, Estimated: 9 mL/min — ABNORMAL LOW
Glucose, Bld: 79 mg/dL (ref 70–99)
Potassium: 3.8 mmol/L (ref 3.5–5.1)
Sodium: 135 mmol/L (ref 135–145)
Total Bilirubin: 0.6 mg/dL (ref 0.0–1.2)
Total Protein: 8 g/dL (ref 6.5–8.1)

## 2024-01-19 LAB — CBC
HCT: 30.2 % — ABNORMAL LOW (ref 36.0–46.0)
Hemoglobin: 9.5 g/dL — ABNORMAL LOW (ref 12.0–15.0)
MCH: 31.3 pg (ref 26.0–34.0)
MCHC: 31.5 g/dL (ref 30.0–36.0)
MCV: 99.3 fL (ref 80.0–100.0)
Platelets: 200 K/uL (ref 150–400)
RBC: 3.04 MIL/uL — ABNORMAL LOW (ref 3.87–5.11)
RDW: 15.9 % — ABNORMAL HIGH (ref 11.5–15.5)
WBC: 5.7 K/uL (ref 4.0–10.5)
nRBC: 0 % (ref 0.0–0.2)

## 2024-01-19 LAB — CBC WITH DIFFERENTIAL/PLATELET
Abs Immature Granulocytes: 0.03 K/uL (ref 0.00–0.07)
Basophils Absolute: 0.1 K/uL (ref 0.0–0.1)
Basophils Relative: 1 %
Eosinophils Absolute: 0 K/uL (ref 0.0–0.5)
Eosinophils Relative: 0 %
HCT: 29.9 % — ABNORMAL LOW (ref 36.0–46.0)
Hemoglobin: 9.3 g/dL — ABNORMAL LOW (ref 12.0–15.0)
Immature Granulocytes: 1 %
Lymphocytes Relative: 9 %
Lymphs Abs: 0.5 K/uL — ABNORMAL LOW (ref 0.7–4.0)
MCH: 31.4 pg (ref 26.0–34.0)
MCHC: 31.1 g/dL (ref 30.0–36.0)
MCV: 101 fL — ABNORMAL HIGH (ref 80.0–100.0)
Monocytes Absolute: 0.7 K/uL (ref 0.1–1.0)
Monocytes Relative: 12 %
Neutro Abs: 4.6 K/uL (ref 1.7–7.7)
Neutrophils Relative %: 77 %
Platelets: 113 K/uL — ABNORMAL LOW (ref 150–400)
RBC: 2.96 MIL/uL — ABNORMAL LOW (ref 3.87–5.11)
RDW: 15.4 % (ref 11.5–15.5)
WBC: 5.9 K/uL (ref 4.0–10.5)
nRBC: 0 % (ref 0.0–0.2)

## 2024-01-19 LAB — VITAMIN D 25 HYDROXY (VIT D DEFICIENCY, FRACTURES): Vit D, 25-Hydroxy: 8 ng/mL — ABNORMAL LOW (ref 30–100)

## 2024-01-19 LAB — BASIC METABOLIC PANEL WITH GFR
Anion gap: 9 (ref 5–15)
BUN: 16 mg/dL (ref 8–23)
CO2: 29 mmol/L (ref 22–32)
Calcium: 8.1 mg/dL — ABNORMAL LOW (ref 8.9–10.3)
Chloride: 97 mmol/L — ABNORMAL LOW (ref 98–111)
Creatinine, Ser: 4.21 mg/dL — ABNORMAL HIGH (ref 0.44–1.00)
GFR, Estimated: 11 mL/min — ABNORMAL LOW
Glucose, Bld: 96 mg/dL (ref 70–99)
Potassium: 3.4 mmol/L — ABNORMAL LOW (ref 3.5–5.1)
Sodium: 135 mmol/L (ref 135–145)

## 2024-01-19 LAB — HEPATITIS B SURFACE ANTIGEN: Hepatitis B Surface Ag: NONREACTIVE

## 2024-01-19 MED ORDER — ROCURONIUM BROMIDE 10 MG/ML (PF) SYRINGE
PREFILLED_SYRINGE | INTRAVENOUS | Status: AC
  Filled 2024-01-19: qty 10

## 2024-01-19 MED ORDER — ROCURONIUM BROMIDE 10 MG/ML (PF) SYRINGE
PREFILLED_SYRINGE | INTRAVENOUS | Status: DC | PRN
  Administered 2024-01-19: 50 mg via INTRAVENOUS
  Administered 2024-01-19: 20 mg via INTRAVENOUS

## 2024-01-19 MED ORDER — POLYETHYLENE GLYCOL 3350 17 G PO PACK
17.0000 g | PACK | Freq: Every day | ORAL | Status: DC | PRN
  Administered 2024-01-24: 17 g via ORAL
  Filled 2024-01-19: qty 1

## 2024-01-19 MED ORDER — SODIUM CHLORIDE 0.9 % IV SOLN: INTRAVENOUS | Status: DC

## 2024-01-19 MED ORDER — PHENYLEPHRINE 80 MCG/ML (10ML) SYRINGE FOR IV PUSH (FOR BLOOD PRESSURE SUPPORT)
PREFILLED_SYRINGE | INTRAVENOUS | Status: AC
  Filled 2024-01-19: qty 10

## 2024-01-19 MED ORDER — FENTANYL CITRATE (PF) 250 MCG/5ML IJ SOLN
INTRAMUSCULAR | Status: AC
  Filled 2024-01-19: qty 5

## 2024-01-19 MED ORDER — PROPOFOL 10 MG/ML IV BOLUS
INTRAVENOUS | Status: AC
  Filled 2024-01-19: qty 20

## 2024-01-19 MED ORDER — CEFAZOLIN SODIUM-DEXTROSE 1-4 GM/50ML-% IV SOLN
1.0000 g | INTRAVENOUS | Status: AC
  Administered 2024-01-19: 1 g via INTRAVENOUS

## 2024-01-19 MED ORDER — METOCLOPRAMIDE HCL 5 MG/ML IJ SOLN: 5.0000 mg | Freq: Three times a day (TID) | INTRAMUSCULAR | Status: DC | PRN

## 2024-01-19 MED ORDER — PROPOFOL 10 MG/ML IV BOLUS
INTRAVENOUS | Status: DC | PRN
  Administered 2024-01-19: 100 mg via INTRAVENOUS

## 2024-01-19 MED ORDER — ONDANSETRON HCL 4 MG/2ML IJ SOLN
INTRAMUSCULAR | Status: AC
  Filled 2024-01-19: qty 2

## 2024-01-19 MED ORDER — FENTANYL CITRATE (PF) 100 MCG/2ML IJ SOLN: 25.0000 ug | INTRAMUSCULAR | Status: DC | PRN

## 2024-01-19 MED ORDER — CEFAZOLIN SODIUM-DEXTROSE 2-4 GM/100ML-% IV SOLN
INTRAVENOUS | Status: AC
  Filled 2024-01-19: qty 100

## 2024-01-19 MED ORDER — 0.9 % SODIUM CHLORIDE (POUR BTL) OPTIME
TOPICAL | Status: DC | PRN
  Administered 2024-01-19: 1000 mL

## 2024-01-19 MED ORDER — MIDAZOLAM HCL 2 MG/2ML IJ SOLN
INTRAMUSCULAR | Status: AC
  Filled 2024-01-19: qty 2

## 2024-01-19 MED ORDER — PARICALCITOL 5 MCG/ML IV SOLN
4.5000 ug | INTRAVENOUS | Status: DC
  Administered 2024-01-20 – 2024-01-30 (×3): 4.5 ug via INTRAVENOUS
  Filled 2024-01-19 (×8): qty 0.9

## 2024-01-19 MED ORDER — DEXAMETHASONE SOD PHOSPHATE PF 10 MG/ML IJ SOLN
INTRAMUSCULAR | Status: DC | PRN
  Administered 2024-01-19: 10 mg via INTRAVENOUS

## 2024-01-19 MED ORDER — HYDROMORPHONE HCL 1 MG/ML IJ SOLN: 0.5000 mg | INTRAMUSCULAR | Status: DC | PRN

## 2024-01-19 MED ORDER — LIDOCAINE 2% (20 MG/ML) 5 ML SYRINGE
INTRAMUSCULAR | Status: DC | PRN
  Administered 2024-01-19: 40 mg via INTRAVENOUS

## 2024-01-19 MED ORDER — AMISULPRIDE (ANTIEMETIC) 5 MG/2ML IV SOLN: 10.0000 mg | Freq: Once | INTRAVENOUS | Status: DC | PRN

## 2024-01-19 MED ORDER — POVIDONE-IODINE 10 % EX SWAB: 2.0000 | Freq: Once | CUTANEOUS | Status: DC

## 2024-01-19 MED ORDER — ASPIRIN 325 MG PO TBEC
325.0000 mg | DELAYED_RELEASE_TABLET | Freq: Every day | ORAL | Status: DC
  Administered 2024-01-20 – 2024-01-30 (×9): 325 mg via ORAL
  Filled 2024-01-19 (×11): qty 1

## 2024-01-19 MED ORDER — CEFAZOLIN SODIUM-DEXTROSE 2-4 GM/100ML-% IV SOLN: 2.0000 g | Freq: Three times a day (TID) | INTRAVENOUS | Status: DC

## 2024-01-19 MED ORDER — DOCUSATE SODIUM 100 MG PO CAPS
100.0000 mg | ORAL_CAPSULE | Freq: Two times a day (BID) | ORAL | Status: DC
  Administered 2024-01-19 – 2024-01-29 (×14): 100 mg via ORAL
  Filled 2024-01-19 (×18): qty 1

## 2024-01-19 MED ORDER — BENZONATATE 100 MG PO CAPS
200.0000 mg | ORAL_CAPSULE | Freq: Three times a day (TID) | ORAL | Status: DC
  Administered 2024-01-19: 200 mg via ORAL
  Filled 2024-01-19 (×10): qty 2

## 2024-01-19 MED ORDER — ACETAMINOPHEN 325 MG PO TABS
325.0000 mg | ORAL_TABLET | Freq: Four times a day (QID) | ORAL | Status: DC | PRN
  Administered 2024-01-20 – 2024-01-28 (×12): 325 mg via ORAL
  Administered 2024-01-28 – 2024-01-30 (×3): 650 mg via ORAL
  Filled 2024-01-19: qty 2
  Filled 2024-01-19 (×3): qty 1
  Filled 2024-01-19: qty 2
  Filled 2024-01-19: qty 1
  Filled 2024-01-19 (×2): qty 2
  Filled 2024-01-19: qty 1
  Filled 2024-01-19 (×2): qty 2
  Filled 2024-01-19 (×5): qty 1

## 2024-01-19 MED ORDER — LIDOCAINE 2% (20 MG/ML) 5 ML SYRINGE
INTRAMUSCULAR | Status: AC
  Filled 2024-01-19: qty 5

## 2024-01-19 MED ORDER — ONDANSETRON HCL 4 MG/2ML IJ SOLN: 4.0000 mg | Freq: Four times a day (QID) | INTRAMUSCULAR | Status: DC | PRN

## 2024-01-19 MED ORDER — ACETAMINOPHEN 10 MG/ML IV SOLN: 1000.0000 mg | Freq: Once | INTRAVENOUS | Status: DC | PRN

## 2024-01-19 MED ORDER — POTASSIUM CHLORIDE CRYS ER 20 MEQ PO TBCR
40.0000 meq | EXTENDED_RELEASE_TABLET | Freq: Once | ORAL | Status: AC
  Administered 2024-01-19: 40 meq via ORAL
  Filled 2024-01-19: qty 2

## 2024-01-19 MED ORDER — TRANEXAMIC ACID-NACL 1000-0.7 MG/100ML-% IV SOLN
1000.0000 mg | Freq: Once | INTRAVENOUS | Status: AC
  Administered 2024-01-19: 1000 mg via INTRAVENOUS
  Filled 2024-01-19: qty 100

## 2024-01-19 MED ORDER — ACETAMINOPHEN 10 MG/ML IV SOLN
1000.0000 mg | Freq: Once | INTRAVENOUS | Status: AC
  Administered 2024-01-19: 1000 mg via INTRAVENOUS
  Filled 2024-01-19: qty 100

## 2024-01-19 MED ORDER — CHLORHEXIDINE GLUCONATE 0.12 % MT SOLN
15.0000 mL | Freq: Once | OROMUCOSAL | Status: AC
  Filled 2024-01-19: qty 15

## 2024-01-19 MED ORDER — DIPHENHYDRAMINE HCL 12.5 MG/5ML PO ELIX: 12.5000 mg | ORAL_SOLUTION | ORAL | Status: DC | PRN

## 2024-01-19 MED ORDER — ORAL CARE MOUTH RINSE
15.0000 mL | Freq: Once | OROMUCOSAL | Status: AC
  Administered 2024-01-19: 15 mL via OROMUCOSAL

## 2024-01-19 MED ORDER — ONDANSETRON HCL 4 MG/2ML IJ SOLN
INTRAMUSCULAR | Status: DC | PRN
  Administered 2024-01-19: 4 mg via INTRAVENOUS

## 2024-01-19 MED ORDER — HYDROCODONE-ACETAMINOPHEN 5-325 MG PO TABS: 1.0000 | ORAL_TABLET | Freq: Four times a day (QID) | ORAL | Status: DC | PRN

## 2024-01-19 MED ORDER — ONDANSETRON HCL 4 MG PO TABS: 4.0000 mg | ORAL_TABLET | Freq: Four times a day (QID) | ORAL | Status: DC | PRN

## 2024-01-19 MED ORDER — MIDAZOLAM HCL (PF) 2 MG/2ML IJ SOLN
INTRAMUSCULAR | Status: DC | PRN
  Administered 2024-01-19: 2 mg via INTRAVENOUS

## 2024-01-19 MED ORDER — DEXAMETHASONE SOD PHOSPHATE PF 10 MG/ML IJ SOLN
INTRAMUSCULAR | Status: AC
  Filled 2024-01-19: qty 1

## 2024-01-19 MED ORDER — ONDANSETRON HCL 4 MG/2ML IJ SOLN: 4.0000 mg | Freq: Once | INTRAMUSCULAR | Status: DC | PRN

## 2024-01-19 MED ORDER — METOCLOPRAMIDE HCL 5 MG PO TABS: 5.0000 mg | ORAL_TABLET | Freq: Three times a day (TID) | ORAL | Status: DC | PRN

## 2024-01-19 MED ORDER — FENTANYL CITRATE (PF) 250 MCG/5ML IJ SOLN
INTRAMUSCULAR | Status: DC | PRN
  Administered 2024-01-19 (×2): 50 ug via INTRAVENOUS

## 2024-01-19 MED ORDER — GUAIFENESIN ER 600 MG PO TB12
600.0000 mg | ORAL_TABLET | Freq: Two times a day (BID) | ORAL | Status: DC
  Filled 2024-01-19 (×10): qty 1

## 2024-01-19 MED ORDER — SUGAMMADEX SODIUM 200 MG/2ML IV SOLN
INTRAVENOUS | Status: DC | PRN
  Administered 2024-01-19: 200 mg via INTRAVENOUS

## 2024-01-19 NOTE — Anesthesia Preprocedure Evaluation (Addendum)
"                                    Anesthesia Evaluation  Patient identified by MRN, date of birth, ID band Patient awake    Reviewed: Allergy & Precautions, NPO status , Patient's Chart, lab work & pertinent test results  Airway Mallampati: II  TM Distance: >3 FB Neck ROM: Full    Dental no notable dental hx.    Pulmonary pneumonia, unresolved Home oxygen    Pulmonary exam normal        Cardiovascular hypertension, Pt. on home beta blockers +CHF  Normal cardiovascular exam  ECHO: 1. Left ventricular ejection fraction, by estimation, is 30 to 35%. Left  ventricular ejection fraction by 3D volume is 39 %. The left ventricle has  moderately decreased function. The left ventricle demonstrates global  hypokinesis. The left ventricular  internal cavity size was mildly to moderately dilated. There is mild  concentric left ventricular hypertrophy. Left ventricular diastolic  parameters are consistent with Grade II diastolic dysfunction  (pseudonormalization). Elevated left atrial pressure.   2. Right ventricular systolic function is normal. The right ventricular  size is moderately enlarged. There is moderately elevated pulmonary artery  systolic pressure. The estimated right ventricular systolic pressure is  52.7 mmHg.   3. Left atrial size was severely dilated.   4. Right atrial size was moderately dilated.   5. The mitral valve is degenerative. Trivial mitral valve regurgitation.  No evidence of mitral stenosis.   6. Tricuspid valve regurgitation is moderate to severe.   7. The aortic valve is tricuspid. Aortic valve regurgitation is not  visualized. Aortic valve sclerosis/calcification is present, without any  evidence of aortic stenosis.   8. The inferior vena cava is dilated in size with <50% respiratory  variability, suggesting right atrial pressure of 15 mmHg.     Neuro/Psych Seizures -,  PSYCHIATRIC DISORDERS Anxiety        GI/Hepatic negative GI ROS,  Neg liver ROS,,,  Endo/Other  negative endocrine ROS    Renal/GU Dialysis and ESRFRenal diseaseOn HD T, R, Sat     Musculoskeletal negative musculoskeletal ROS (+)    Abdominal   Peds  Hematology  (+) Blood dyscrasia, anemia   Anesthesia Other Findings  Left femoral neck fracture  Reproductive/Obstetrics                              Anesthesia Physical Anesthesia Plan  ASA: 4  Anesthesia Plan: General   Post-op Pain Management:    Induction: Intravenous  PONV Risk Score and Plan: 3 and Ondansetron , Dexamethasone  and Treatment may vary due to age or medical condition  Airway Management Planned: Oral ETT  Additional Equipment:   Intra-op Plan:   Post-operative Plan: Extubation in OR  Informed Consent: I have reviewed the patients History and Physical, chart, labs and discussed the procedure including the risks, benefits and alternatives for the proposed anesthesia with the patient or authorized representative who has indicated his/her understanding and acceptance.     Dental advisory given  Plan Discussed with: CRNA  Anesthesia Plan Comments:          Anesthesia Quick Evaluation  "

## 2024-01-19 NOTE — Anesthesia Procedure Notes (Signed)
 Procedure Name: Intubation Date/Time: 01/19/2024 12:36 PM  Performed by: Julien Manus, CRNAPre-anesthesia Checklist: Patient identified, Emergency Drugs available, Suction available and Patient being monitored Patient Re-evaluated:Patient Re-evaluated prior to induction Oxygen  Delivery Method: Circle System Utilized Preoxygenation: Pre-oxygenation with 100% oxygen  Induction Type: IV induction Ventilation: Mask ventilation without difficulty Laryngoscope Size: Mac and 3 Grade View: Grade I Tube type: Oral Tube size: 7.0 mm Number of attempts: 1 Airway Equipment and Method: Stylet and Oral airway Placement Confirmation: ETT inserted through vocal cords under direct vision, positive ETCO2 and breath sounds checked- equal and bilateral Secured at: 22 cm Tube secured with: Tape Dental Injury: Teeth and Oropharynx as per pre-operative assessment

## 2024-01-19 NOTE — Progress Notes (Signed)
 "          [facility]                                                                              Barbara Crawford, is a 69 y.o. female, DOB - Nov 10, 1955, FMW:[id number] Admit date - 01/18/2024    Outpatient Primary MD for the patient is Tammy Rasmussen T, PA-C  LOS - 1  days  Chief Complaint  Patient presents with   Hip Pain       Brief summary   Patient is a 69 year old female with ESRD on HD, TTS, chronic hypoxic respiratory failure on 2 L O2 at baseline, HFrEF 30 to 35%, CAD, HTN, anxiety, chronic dyspnea presented to ED with left leg pain and hip pain.  Patient reported that she was in the medical transport after she had finished dialysis manage stopped short and she fell into the aisle.  She had immediate left hip pain, could not get up again after sitting down.  Presented to ED where x-rays showed a left hip fracture.  Orthopedic surgery consulted. At baseline, lives at home with her daughter and not use any assistive devices.  Assessment & Plan       Closed left femoral fracture (HCC) -Orthopedics consulted, n.p.o. plan for surgery today - Pain control, DVT prophylaxis per orthopedics.     ESRD (end stage renal disease) (HCC) on hemodialysis, TTS - Nephrology consulted, patient had completed hemodialysis on 1/15 prior to the fall - Continue HD per schedule  Essential hypertension chronic HFrEF - Continue Toprol -XL 25 mg daily    Chronic hypoxic respiratory failure - Currently stable, no wheezing, continue 2 L O2 via [place], at baseline   History of CAD -Continue Toprol -XL.   - Hold aspirin , plan for OR today    Estimated body mass index is 21.13 kg/m as calculated from the following:   Height as of 12/30/23: 5' 3 (1.6 m).   Weight as of 01/02/24: 54.1 kg.  Code Status: Full code DVT Prophylaxis:  heparin  injection 5,000 Units Start: 01/19/24 0600 SCDs Start: 01/18/24 2341 Place TED hose Start: 01/18/24 2341   Level of Care: Level of care:  Telemetry Family Communication: Updated patient Disposition Plan:      Remains inpatient appropriate: Plan for surgery today   Procedures:    Consultants:   Orthopedics Nephrology  Antimicrobials:   Anti-infectives (From admission, onward)    None          Medications  ferric citrate   420 mg Oral TID WC   heparin   5,000 Units Subcutaneous Q8H   metoprolol  succinate  25 mg Oral Daily   multivitamin  1 tablet Oral QHS   sodium chloride  flush  3 mL Intravenous Q12H   sodium chloride  flush  3 mL Intravenous Q12H      Subjective:   Barbara Crawford was seen and examined today.  Currently left hip pain, awaiting surgery.  Patient denies dizziness, chest pain, shortness of breath, abdominal pain, N/V.  No fevers.  Having some cough occasionally. Objective:   Vitals:   01/19/24 0211 01/19/24 0700 01/19/24 0804 01/19/24 0910  BP: 138/71 (!) 145/71  (!) 145/71  Pulse:  80 85  85  Resp: 20 (!) 24    Temp: 98.2 F (36.8 C)  97.6 F (36.4 C)   TempSrc: Oral  Oral   SpO2: 100% 100%      Intake/Output Summary (Last 24 hours) at 01/19/2024 0932 Last data filed at 01/19/2024 0303 Gross per 24 hour  Intake 100 ml  Output --  Net 100 ml     Wt Readings from Last 3 Encounters:  01/02/24 54.1 kg  11/17/23 55.8 kg  10/26/23 56.2 kg     Exam General: Alert and oriented x 3, NAD Cardiovascular: S1 S2 auscultated,  RRR Respiratory: Clear to auscultation bilaterally, no wheezing Gastrointestinal: Soft, nontender, nondistended, + bowel sounds Ext: no pedal edema bilaterally Neuro: No new deficits Psych: Normal affect     Data Reviewed:  I have personally reviewed following labs    CBC Lab Results  Component Value Date   WBC 5.7 01/19/2024   RBC 3.04 (L) 01/19/2024   HGB 9.5 (L) 01/19/2024   HCT 30.2 (L) 01/19/2024   MCV 99.3 01/19/2024   MCH 31.3 01/19/2024   PLT 200 01/19/2024   MCHC 31.5 01/19/2024   RDW 15.9 (H) 01/19/2024   LYMPHSABS 0.5 (L)  01/19/2024   MONOABS 0.7 01/19/2024   EOSABS 0.0 01/19/2024   BASOSABS 0.1 01/19/2024     Last metabolic panel Lab Results  Component Value Date   NA 135 01/19/2024   K 3.4 (L) 01/19/2024   CL 97 (L) 01/19/2024   CO2 29 01/19/2024   BUN 16 01/19/2024   CREATININE 4.21 (H) 01/19/2024   GLUCOSE 96 01/19/2024   GFRNONAA 11 (L) 01/19/2024   GFRAA >60 01/28/2016   CALCIUM  8.1 (L) 01/19/2024   PHOS 5.1 (H) 12/31/2023   PROT 8.9 (H) 12/30/2023   ALBUMIN  3.7 12/30/2023   LABGLOB 4.1 (H) 03/16/2020   AGRATIO 0.6 (L) 03/16/2020   BILITOT 0.9 12/30/2023   ALKPHOS 54 12/30/2023   AST 41 12/30/2023   ALT 13 12/30/2023   ANIONGAP 9 01/19/2024    CBG (last 3)  No results for input(s): GLUCAP in the last 72 hours.    Coagulation Profile: No results for input(s): INR, PROTIME in the last 168 hours.   Radiology Studies: I have personally reviewed the imaging studies  CT Hip Left Wo Contrast Result Date: 01/18/2024 EXAM: CT OF THE LEFT HIP WITHOUT IV CONTRAST 01/18/2024 11:36:46 PM TECHNIQUE: CT of the left hip was performed without the administration of intravenous contrast. Multiplanar reformatted images are provided for review. Automated exposure control, iterative reconstruction, and/or weight based adjustment of the mA/kV was utilized to reduce the radiation dose to as low as reasonably achievable. COMPARISON: X-ray left hip 01/18/2024 CLINICAL HISTORY: Hip surgical planning FINDINGS: BONES: Acute nondisplaced left subcapital femoral neck fracture. No hip dislocation. No acute fractures of the partially visualized left pelvic bones. No aggressive appearing osseous abnormality or periostitis. SOFT TISSUE: Lipohemarthrosis of the left hip (8:41). No soft tissue mass. JOINT: Lipohemarthrosis of the left hip (8:41). No significant degenerative changes. No osseous erosions. INTRAPELVIC CONTENTS: Limited images of the intrapelvic contents are unremarkable. IMPRESSION: 1. Acute  nondisplaced left subcapital femoral neck fracture with associated lipohemarthrosis. Electronically signed by: Morgane Naveau MD 01/18/2024 11:46 PM EST RP Workstation: [id number]   DG Pelvis 1-2 Views Result Date: 01/18/2024 EXAM: 1 or 2 VIEW(S) XRAY OF THE PELVIS 01/18/2024 10:26:00 PM COMPARISON: None available. CLINICAL HISTORY: Hip pain FINDINGS: BONES AND JOINTS: Impacted subcapital left femoral neck fracture is  noted. Degenerative changes of the right hip. No malalignment. SOFT TISSUES: Subcutaneous soft tissue edema. IMPRESSION: 1. Impacted subcapital left femoral neck fracture. 2. Degenerative changes of the right hip. Electronically signed by: Oneil Devonshire MD 01/18/2024 10:32 PM EST RP Workstation: [id number]   DG Femur Min 2 Views Left Result Date: 01/18/2024 EXAM: 2 VIEW(S) XRAY OF THE LEFT FEMUR 01/18/2024 10:26:00 PM COMPARISON: None available. CLINICAL HISTORY: Left hip pain Left hip pain Left hip pain Left hip pain Left hip pain FINDINGS: BONES AND JOINTS: Acute impacted subcapital left femoral neck fracture. No malalignment. SOFT TISSUES: Unremarkable. IMPRESSION: 1. Acute impacted subcapital left femoral neck fracture. Electronically signed by: Oneil Devonshire MD 01/18/2024 10:31 PM EST RP Workstation: [id number] Nydia Distance M.D. [facility] 01/19/2024, 9:32 AM  Available via Epic secure chat 7am-7pm After 7 pm, please refer to night coverage provider listed on amion.    "

## 2024-01-19 NOTE — ED Notes (Signed)
 Patient placed on bedpan to attempt to have BM. Pt is passing flatus, but at this time states she does not need to go. This RN performed peric are, repositioned and placed new brief on patient. Patient has no other complaints at this time.

## 2024-01-19 NOTE — ED Notes (Signed)
 PT was just changed and had a BM

## 2024-01-19 NOTE — Op Note (Signed)
 Orthopaedic Surgery Operative Note (CSN: [id number] ) Date of Surgery: 01/19/2024  Admit Date: 01/18/2024   Diagnoses: Pre-Op Diagnoses: Left valgus impacted femoral neck fracture  Post-Op Diagnosis: Same  Procedures: CPT 27235-Percutaneous fixation of left femoral neck  Surgeons : Primary: Kendal Franky SQUIBB, MD  Assistant: Lauraine Moores, PA-C  Location: OR 3   Anesthesia: General   Antibiotics: Ancef  2g preop   Tourniquet time: None    Estimated Blood Loss: Minimal  Complications:* No complications entered in OR log *   Specimens:* No specimens in log *   Implants: Implant Name Type Inv. Item Serial No. Manufacturer Lot No. LRB No. Used Action  SCREW FULLY THREADED 6.5X80 - [id number] Screw SCREW FULLY THREADED 6.5X80  [employer](ORTH,TRAU,BIO,SG)  Left 3 Implanted     Indications for Surgery: 69 year old female who sustained a ground-level fall with a valgus impacted femoral neck fracture.  Due to her age and mobility and the minimal displacement I recommend proceeding with percutaneous fixation of her left femoral neck.  Risks and benefits were discussed with the patient and her family.  Risks include but not limited to bleeding, infection, malunion, nonunion, hardware failure, hardware rotation, nerve and blood vessel injury, DVT, and the possibility anesthetic complications.  She agreed to proceed with surgery and consent was obtained.  Operative Findings: 1.  Percutaneous fixation of left valgus impacted femoral neck fracture using [employer] 6.5 mm titanium cannulated screws x 3  Procedure: The patient was identified in the preoperative holding area. Consent was confirmed with the patient and their family and all questions were answered. The operative extremity was marked after confirmation with the patient. she was then brought back to the operating room by our anesthesia colleagues.  She was placed under general anesthetic and carefully transferred over to  radiolucent flattop table.  A bump was placed under her operative hip.  The left lower extremity was then prepped and draped in usual sterile fashion.  A timeout was performed to verify the patient, the procedure, and the extremity.  Preoperative antibiotics were dosed.  Fluoroscopic imaging showed that the fracture did not displace.  We then made a percutaneous incision along the lateral aspect of the femur.  I went through the IT band and then proceeded to direct the guidewires in an inverted triangle fashion into the femoral neck.  I confirmed adequate position of the K wires using fluoroscopic imaging.  Confirmed that they were all within the femoral head and not within the articular surface.  I then measured drilled the outer cortex and then placed a fully threaded 6.5 mm cannulated screws.  All the screws are 80 mm in length.  Excellent fixation was obtained.  The guidewires were removed and final fluoroscopic imaging was obtained.  The incision was irrigated and closed with 2-0 Monocryl and Dermabond.  Sterile dressing was applied.  The patient was then awoke from anesthesia and taken to the PACU in stable condition.  Post Op Plan/Instructions: Patient will be weightbearing as tolerated to the left lower extremity.  She will receive postoperative Ancef .  She will receive aspirin  for DVT prophylaxis.  I was present and performed the entire surgery.  Lauraine Moores, PA-C did assist me throughout the case. An assistant was necessary given the difficulty in approach, maintenance of reduction and ability to instrument the fracture.   Franky Kendal, MD [facility]

## 2024-01-19 NOTE — ED Notes (Signed)
 Patient requesting to have Tylenol  500 mg IV and not full dose.  Rate and amount to be administered changed per patient request.  Patient given Tylenol  500 mg IV per request, and not Tylenol  1000 mg IV.

## 2024-01-19 NOTE — Progress Notes (Signed)
 PHARMACY NOTE:  ANTIMICROBIAL RENAL DOSAGE ADJUSTMENT  Current antimicrobial regimen includes a mismatch between antimicrobial dosage and estimated renal function.  As per policy approved by the Pharmacy & Therapeutics and Medical Executive Committees, the antimicrobial dosage will be adjusted accordingly.  Current antimicrobial dosage:  Ancef  2gm IV q8h x3  Indication: surgical prophylaxis  Renal Function:  Estimated Creatinine Clearance: 10.7 mL/min (A) (by C-G formula based on SCr of 4.21 mg/dL (H)). [x]      On intermittent HD, scheduled: TTS    Antimicrobial dosage has been changed to:  No further Ancef  needed, pre-op dose will cover post-op period  Thank you for allowing pharmacy to be a part of this patient's care.  Vito Ralph, PharmD, BCPS Please see amion for complete clinical pharmacist phone list 01/19/2024 2:54 PM

## 2024-01-19 NOTE — Plan of Care (Signed)
" °  Problem: Education: Goal: Knowledge of General Education information will improve Description: Including pain rating scale, medication(s)/side effects and non-pharmacologic comfort measures 01/19/2024 1926 by Ronia Hazelett M, RN Outcome: Progressing 01/19/2024 1905 by Juan Kissoon M, RN Outcome: Progressing   Problem: Health Behavior/Discharge Planning: Goal: Ability to manage health-related needs will improve Outcome: Progressing   Problem: Clinical Measurements: Goal: Ability to maintain clinical measurements within normal limits will improve Outcome: Progressing Goal: Will remain free from infection Outcome: Progressing Goal: Diagnostic test results will improve 01/19/2024 1926 by Neasia Fleeman M, RN Outcome: Progressing 01/19/2024 1905 by Wolf Boulay M, RN Outcome: Progressing Goal: Respiratory complications will improve 01/19/2024 1926 by Darianny Momon M, RN Outcome: Progressing 01/19/2024 1905 by Kedrick Mcnamee M, RN Outcome: Progressing Goal: Cardiovascular complication will be avoided Outcome: Progressing   Problem: Activity: Goal: Risk for activity intolerance will decrease Outcome: Progressing   Problem: Nutrition: Goal: Adequate nutrition will be maintained Outcome: Progressing   Problem: Coping: Goal: Level of anxiety will decrease Outcome: Progressing   Problem: Elimination: Goal: Will not experience complications related to bowel motility Outcome: Progressing Goal: Will not experience complications related to urinary retention Outcome: Progressing   Problem: Pain Managment: Goal: General experience of comfort will improve and/or be controlled Outcome: Progressing   Problem: Safety: Goal: Ability to remain free from injury will improve Outcome: Progressing   Problem: Skin Integrity: Goal: Risk for impaired skin integrity will decrease Outcome: Progressing   Problem: Education: Goal: Knowledge of the prescribed therapeutic  regimen will improve Outcome: Progressing   Problem: Bowel/Gastric: Goal: Gastrointestinal status for postoperative course will improve Outcome: Progressing   Problem: Cardiac: Goal: Ability to maintain an adequate cardiac output Outcome: Progressing Goal: Will show no evidence of cardiac arrhythmias Outcome: Progressing   Problem: Nutritional: Goal: Will attain and maintain optimal nutritional status Outcome: Progressing   Problem: Neurological: Goal: Will regain or maintain usual level of consciousness Outcome: Progressing   Problem: Clinical Measurements: Goal: Ability to maintain clinical measurements within normal limits Outcome: Progressing Goal: Postoperative complications will be avoided or minimized Outcome: Progressing   Problem: Respiratory: Goal: Will regain and/or maintain adequate ventilation Outcome: Progressing Goal: Respiratory status will improve Outcome: Progressing   Problem: Skin Integrity: Goal: Demonstrates signs of wound healing without infection Outcome: Progressing   Problem: Urinary Elimination: Goal: Will remain free from infection Outcome: Progressing Goal: Ability to achieve and maintain adequate urine output Outcome: Progressing   "

## 2024-01-19 NOTE — Transfer of Care (Signed)
 Immediate Anesthesia Transfer of Care Note  Patient: Barbara Crawford  Procedure(s) Performed: FIXATION, FEMUR, NECK, PERCUTANEOUS, USING SCREW (Left: Hip)  Patient Location: PACU  Anesthesia Type:General  Level of Consciousness: awake and alert   Airway & Oxygen  Therapy: Patient Spontanous Breathing and Patient connected to face mask oxygen   Post-op Assessment: Report given to RN and Post -op Vital signs reviewed and stable  Post vital signs: Reviewed and stable  Last Vitals:  Vitals Value Taken Time  BP 129/76 01/19/24 13:38  Temp    Pulse 84 01/19/24 13:43  Resp 22 01/19/24 13:43  SpO2 96 % 01/19/24 13:43  Vitals shown include unfiled device data.  Last Pain:  Vitals:   01/19/24 1103  TempSrc: Oral  PainSc:          Complications: No notable events documented.

## 2024-01-19 NOTE — Anesthesia Postprocedure Evaluation (Signed)
"   Anesthesia Post Note  Patient: Barbara Crawford  Procedure(s) Performed: FIXATION, FEMUR, NECK, PERCUTANEOUS, USING SCREW (Left: Hip)     Patient location during evaluation: PACU Anesthesia Type: General Level of consciousness: awake and alert Pain management: pain level controlled Vital Signs Assessment: post-procedure vital signs reviewed and stable Respiratory status: spontaneous breathing, nonlabored ventilation and respiratory function stable Cardiovascular status: blood pressure returned to baseline and stable Postop Assessment: no apparent nausea or vomiting Anesthetic complications: no   No notable events documented.  Last Vitals:  Vitals:   01/19/24 1415 01/19/24 1450  BP: (!) 159/82 (!) 155/75  Pulse: 87 89  Resp: (!) 23 16  Temp: 36.6 C 36.4 C  SpO2: 98% 100%    Last Pain:  Vitals:   01/19/24 1451  TempSrc:   PainSc: 0-No pain                 Butler Levander Pinal      "

## 2024-01-19 NOTE — ED Notes (Signed)
Attempted IV with no success. 
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
## 2024-01-19 NOTE — Plan of Care (Signed)

## 2024-01-19 NOTE — Interval H&P Note (Signed)
 History and Physical Interval Note:  01/19/2024 11:43 AM  Reena KATHEE Debby Glendia  has presented today for surgery, with the diagnosis of Left femoral neck fracture.  The various methods of treatment have been discussed with the patient and family. After consideration of risks, benefits and other options for treatment, the patient has consented to  Procedures: FIXATION, FEMUR, NECK, PERCUTANEOUS, USING SCREW (Left) as a surgical intervention.  The patient's history has been reviewed, patient examined, no change in status, stable for surgery.  I have reviewed the patient's chart and labs.  Questions were answered to the patient's satisfaction.     Elsbeth Yearick P Hykeem Ojeda

## 2024-01-19 NOTE — Telephone Encounter (Signed)
Verbal orders given via voice-mail.

## 2024-01-19 NOTE — Telephone Encounter (Signed)
 Copied from CRM #[id number]. Topic: Clinical Concerns - Home Health/Living Facility >> Jan 19, 2024 10:04 AM Borris J wrote: GRACIE FROM [facility] is calling other request    Include all details related to the request(s) below:  GRACIE FROM [facility] VERBAL ORDERS  FOR A NEW STARTER CARE DATE FOR 01/23/2024 HOME HEALTH PHYSICAL THERAPY  CALL BACK - [phone]     Confirm and type the Best Contact Number below:  Patient/caller contact number: [phone]            [] Home  [x] Mobile  [] Work [] Other   [x] Okay to leave a voicemail   Medication List:  Current Outpatient Medications:    acetaminophen  (TYLENOL ) 500 mg tablet, Take 500 mg by mouth every 6 (six) hours., Disp: , Rfl:    aspirin  81 mg chewable tablet, Take 81 mg by mouth Once Daily for 90 days., Disp: 30 tablet, Rfl: 2   B complex-vitamin C-folic acid  (Rena-Vite) 0.8 mg tab tablet, Take 1 tablet by mouth nightly., Disp: 90 tablet, Rfl: 3   diphenhydramine -zinc  acetate (BENADRYL  EXTRA STRENGTH) 2-0.1 % cream, Apply 1 Application topically 2 (two) times a day as needed., Disp: , Rfl:    ferric citrate  (AURYXIA ) 210 mg iron  tab tablet, Take 420 mg by mouth in the morning and 420 mg at noon and 420 mg in the evening. Take with meals., Disp: , Rfl:    LORazepam  (ATIVAN ) 0.5 mg tablet, Take 1 tablet (0.5 mg total) by mouth every 8 (eight) hours as needed for anxiety., Disp: 10 tablet, Rfl: 0   metoprolol  succinate (TOPROL  XL) 25 mg 24 hr tablet, Take 25 mg by mouth daily., Disp: , Rfl:      Medication Request/Refills: Pharmacy Information (if applicable)   [] Not Applicable       []  Pharmacy listed  Send Medication Request to:                                                 [] Pharmacy not listed (added to pharmacy list in Epic) Send Medication Request to:      Listed Pharmacies: [id number] [place], [place] - [address] AT [facility] OF [address] - PHONE:  [phone] - FAX: [phone]
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
## 2024-01-20 DIAGNOSIS — J9611 Chronic respiratory failure with hypoxia: Secondary | ICD-10-CM | POA: Diagnosis not present

## 2024-01-20 DIAGNOSIS — S72002A Fracture of unspecified part of neck of left femur, initial encounter for closed fracture: Secondary | ICD-10-CM | POA: Diagnosis not present

## 2024-01-20 DIAGNOSIS — N186 End stage renal disease: Secondary | ICD-10-CM | POA: Diagnosis not present

## 2024-01-20 LAB — CBC
HCT: 32.2 % — ABNORMAL LOW (ref 36.0–46.0)
HCT: 33.1 % — ABNORMAL LOW (ref 36.0–46.0)
Hemoglobin: 10.3 g/dL — ABNORMAL LOW (ref 12.0–15.0)
Hemoglobin: 10.7 g/dL — ABNORMAL LOW (ref 12.0–15.0)
MCH: 30.8 pg (ref 26.0–34.0)
MCH: 31.2 pg (ref 26.0–34.0)
MCHC: 32 g/dL (ref 30.0–36.0)
MCHC: 32.3 g/dL (ref 30.0–36.0)
MCV: 96.4 fL (ref 80.0–100.0)
MCV: 96.5 fL (ref 80.0–100.0)
Platelets: 213 K/uL (ref 150–400)
Platelets: 177 K/uL (ref 150–400)
RBC: 3.34 MIL/uL — ABNORMAL LOW (ref 3.87–5.11)
RBC: 3.43 MIL/uL — ABNORMAL LOW (ref 3.87–5.11)
RDW: 15.4 % (ref 11.5–15.5)
RDW: 15.4 % (ref 11.5–15.5)
WBC: 6.5 K/uL (ref 4.0–10.5)
WBC: 5.6 K/uL (ref 4.0–10.5)
nRBC: 0 % (ref 0.0–0.2)
nRBC: 0 % (ref 0.0–0.2)

## 2024-01-20 LAB — IRON AND TIBC
Iron: 56 ug/dL (ref 28–170)
Saturation Ratios: 33 % — ABNORMAL HIGH (ref 10.4–31.8)
TIBC: 167 ug/dL — ABNORMAL LOW (ref 250–450)
UIBC: 111 ug/dL

## 2024-01-20 LAB — RENAL FUNCTION PANEL
Albumin: 2.9 g/dL — ABNORMAL LOW (ref 3.5–5.0)
Anion gap: 15 (ref 5–15)
BUN: 28 mg/dL — ABNORMAL HIGH (ref 8–23)
CO2: 23 mmol/L (ref 22–32)
Calcium: 8.7 mg/dL — ABNORMAL LOW (ref 8.9–10.3)
Chloride: 97 mmol/L — ABNORMAL LOW (ref 98–111)
Creatinine, Ser: 5.95 mg/dL — ABNORMAL HIGH (ref 0.44–1.00)
GFR, Estimated: 7 mL/min — ABNORMAL LOW
Glucose, Bld: 103 mg/dL — ABNORMAL HIGH (ref 70–99)
Phosphorus: 6.5 mg/dL — ABNORMAL HIGH (ref 2.5–4.6)
Potassium: 4.5 mmol/L (ref 3.5–5.1)
Sodium: 135 mmol/L (ref 135–145)

## 2024-01-20 LAB — BASIC METABOLIC PANEL WITH GFR
Anion gap: 10 (ref 5–15)
BUN: 12 mg/dL (ref 8–23)
CO2: 27 mmol/L (ref 22–32)
Calcium: 8.3 mg/dL — ABNORMAL LOW (ref 8.9–10.3)
Chloride: 95 mmol/L — ABNORMAL LOW (ref 98–111)
Creatinine, Ser: 3 mg/dL — ABNORMAL HIGH (ref 0.44–1.00)
GFR, Estimated: 16 mL/min — ABNORMAL LOW
Glucose, Bld: 70 mg/dL (ref 70–99)
Potassium: 3.7 mmol/L (ref 3.5–5.1)
Sodium: 132 mmol/L — ABNORMAL LOW (ref 135–145)

## 2024-01-20 LAB — HEPATITIS B SURFACE ANTIBODY, QUANTITATIVE: Hep B S AB Quant (Post): <3.5 m[IU]/mL — ABNORMAL LOW

## 2024-01-20 LAB — FERRITIN: Ferritin: 1493 ng/mL — ABNORMAL HIGH (ref 11–307)

## 2024-01-20 MED ORDER — ANTICOAGULANT SODIUM CITRATE 4% (200MG/5ML) IV SOLN: 5.0000 mL | Status: DC | PRN

## 2024-01-20 MED ORDER — LIDOCAINE HCL (PF) 1 % IJ SOLN: 5.0000 mL | INTRAMUSCULAR | Status: DC | PRN

## 2024-01-20 MED ORDER — LIDOCAINE-PRILOCAINE 2.5-2.5 % EX CREA: 1.0000 | TOPICAL_CREAM | CUTANEOUS | Status: DC | PRN

## 2024-01-20 MED ORDER — PENTAFLUOROPROP-TETRAFLUOROETH EX AERO: 1.0000 | INHALATION_SPRAY | CUTANEOUS | Status: DC | PRN

## 2024-01-20 MED ORDER — HEPARIN SODIUM (PORCINE) 1000 UNIT/ML DIALYSIS: 1000.0000 [IU] | INTRAMUSCULAR | Status: DC | PRN
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
## 2024-01-20 NOTE — Procedures (Signed)
 I was present at this dialysis session. I have reviewed the session itself and made appropriate changes.   Filed Weights   01/19/24 1103 01/20/24 0907  Weight: 54.9 kg 53.2 kg    Recent Labs  Lab 01/20/24 0453  NA 135  K 4.5  CL 97*  CO2 23  GLUCOSE 103*  BUN 28*  CREATININE 5.95*  CALCIUM  8.7*  PHOS 6.5*    Recent Labs  Lab 01/19/24 0110 01/19/24 0500 01/20/24 0730  WBC 5.9 5.7 6.5  NEUTROABS 4.6  --   --   HGB 9.3* 9.5* 10.3*  HCT 29.9* 30.2* 32.2*  MCV 101.0* 99.3 96.4  PLT 113* 200 213    Scheduled Meds:  aspirin  EC  325 mg Oral Daily   benzonatate   200 mg Oral TID   docusate sodium   100 mg Oral BID   ferric citrate   420 mg Oral TID WC   guaiFENesin   600 mg Oral BID   metoprolol  succinate  25 mg Oral Daily   multivitamin  1 tablet Oral QHS   paricalcitol   4.5 mcg Intravenous Q T,Th,Sa-HD   sodium chloride  flush  3 mL Intravenous Q12H   sodium chloride  flush  3 mL Intravenous Q12H   Continuous Infusions:  anticoagulant sodium citrate      PRN Meds:.acetaminophen , anticoagulant sodium citrate , diphenhydrAMINE , ferric citrate , heparin , HYDROcodone -acetaminophen , HYDROmorphone  (DILAUDID ) injection, lidocaine  (PF), lidocaine -prilocaine , methocarbamol  (ROBAXIN ) injection, metoCLOPramide  **OR** metoCLOPramide  (REGLAN ) injection, ondansetron  **OR** ondansetron  (ZOFRAN ) IV, pentafluoroprop-tetrafluoroeth, polyethylene glycol, sodium chloride  flush   Ephriam Stank, MD [facility] 01/20/2024, 11:56 AM

## 2024-01-20 NOTE — Progress Notes (Signed)
" ° °  ORTHOPAEDIC PROGRESS NOTE  s/p Procedures: FIXATION, FEMUR, NECK, PERCUTANEOUS, USING SCREW on 01/19/24 with Dr. Kendal  SUBJECTIVE: Reports mild pain about operative site. Was able to sit on the side of the bed earlier this morning. She is motivated to work with therapy.   No chest pain. No SOB. No nausea/vomiting. No other complaints.  OBJECTIVE: PE: General: resting in hospital bed, NAD LLE: dressing CDI, tenderness left thigh as to be expect, compartments soft and compressible, intact EHL/TA/GSC, warm well perfused foot   Vitals:   01/19/24 2100 01/20/24 0337  BP: (!) 144/85 (!) 144/69  Pulse: 89 83  Resp: 16   Temp: 97.6 F (36.4 C) 98 F (36.7 C)  SpO2: 100%     Opiates Today (MME): Today's  total administered Morphine Milligram Equivalents: 0 Opiates Yesterday (MME): Yesterday's total administered Morphine Milligram Equivalents: 30 Opiates Used in the last two days:  Inpatient Morphine Milligram Equivalents Per Day 1/15 - 1/17   Values displayed are in units of MME/Day    Order Start / End Date 1/15 Yesterday Today    fentaNYL  (SUBLIMAZE ) injection 12.5-25 mcg 1/15 - 1/16 0 of 3.75-7.5 -- --    fentaNYL  (SUBLIMAZE ) injection 25-50 mcg 1/16 - 1/16 -- 0 of 45-90 --    fentaNYL  citrate (PF) (SUBLIMAZE ) injection 1/16 - 1/16 -- *30 of 30 --    HYDROcodone -acetaminophen  (NORCO/VICODIN) 5-325 MG per tablet 1-2 tablet 1/16 - No end date -- 0 of 10-20 0 of 20-40    HYDROmorphone  (DILAUDID ) injection 0.5 mg 1/16 - No end date -- 0 of 40 0 of 80    Daily Totals  0 of 3.75-7.5 * 30 of 125-180 0 of 100-120  *One-Step medication    Stable post op images  ASSESSMENT: Arshia Spellman is a 69 y.o. female POD#1  PLAN: Weightbearing: WBAT LLE Insicional and dressing care: Reinforce dressings as needed Orthopedic device(s): None Showering: hold for now VTE prophylaxis: Aspirin  Pain control: PRN pain medication, minimize narcotics as able ABLA: Hgb 9.5 yesterday,  morning labs pending Follow - up plan: 2 weeks in office with Dr. Kendal Dispo: TBD. PT/OT evals today. Likely SNF.  Contact information: After hours and holidays please check [email or web address] for group call information for [facility]  Aleck Stalling, PA-C 01/20/24    "

## 2024-01-20 NOTE — Progress Notes (Signed)
 " [facility] Progress Note    Assessment/ Plan:   ESRD  -outpatient HD orders: [facility].  EDW 54 kg.  3.5 hours.  2K/2.5 calcium .  AVG flow rates: 400/600.  Heparin : None.  Meds: EPO 6200 units every treatment, Venofer, Zemplar  4.5 mcg every treatment.  Binders: Auryxia  3 tabs 3 times daily AC.  Has been having low albumin  therefore needs protein supplementation -HD per TTS schedule   Closed femoral fracture -s/p surgery 1/16 -cont to work with PT, will need to be able to do HD in recliner eventually   Volume/ hypertension  -UF as tolerated   Anemia of Chronic Kidney Disease -hgb 10.3 -iron  panel ordered -Transfuse PRN for Hgb <7   Secondary Hyperparathyroidism/Hyperphosphatemia - check phos, auryxia  resumed  Subjective:   Patient seen on dialysis. Tolerating treatment. Denies any pain. Was able to sit up at the edge of bed overnight.   Objective:   BP 128/68   Pulse 78   Temp (!) 97.5 F (36.4 C)   Resp 18   Ht 5' 3 (1.6 m)   Wt 53.2 kg   SpO2 100%   BMI 20.78 kg/m   Intake/Output Summary (Last 24 hours) at 01/20/2024 1156 Last data filed at 01/19/2024 1332 Gross per 24 hour  Intake 325 ml  Output 20 ml  Net 305 ml   Weight change:   Physical Exam: Gen: NAD CVS: RRR Resp: normal wob, unlabored Jai:dnqu, nt/nd Ext: no sig edema Neuro: awake, alert Dialysis access: RUE AVG  Imaging: DG HIP PORT UNILAT W OR W/O PELVIS 1V LEFT Result Date: 01/19/2024 CLINICAL DATA:  Fracture, postop. EXAM: DG HIP (WITH OR WITHOUT PELVIS) 1V PORT LEFT COMPARISON:  Preoperative imaging FINDINGS: Three screws traverse left femoral neck fracture. Unchanged fracture alignment. Recent postsurgical change includes air and edema in the soft tissues. Degenerative change of the right hip. Probable calcified uterine fibroids. IMPRESSION: Pinning of left femoral neck fracture. No immediate postoperative complication. Electronically Signed   By: Andrea Gasman M.D.    On: 01/19/2024 17:18   DG HIP UNILAT WITH PELVIS 2-3 VIEWS LEFT Result Date: 01/19/2024 CLINICAL DATA:  Elective surgery EXAM: DG HIP (WITH OR WITHOUT PELVIS) 2-3V LEFT COMPARISON:  Preoperative imaging FINDINGS: Six fluoroscopic spot views of the pelvis and left hip obtained in the operating room. Sequential images during hip pinning. Fluoroscopy time 1 minute 14 seconds. Dose 6.65 mGy. IMPRESSION: Intraoperative fluoroscopy during left hip pinning. Electronically Signed   By: Andrea Gasman M.D.   On: 01/19/2024 17:17   DG C-Arm 1-60 Min-No Report Result Date: 01/19/2024 Fluoroscopy was utilized by the requesting physician.  No radiographic interpretation.   CT Hip Left Wo Contrast Result Date: 01/18/2024 EXAM: CT OF THE LEFT HIP WITHOUT IV CONTRAST 01/18/2024 11:36:46 PM TECHNIQUE: CT of the left hip was performed without the administration of intravenous contrast. Multiplanar reformatted images are provided for review. Automated exposure control, iterative reconstruction, and/or weight based adjustment of the mA/kV was utilized to reduce the radiation dose to as low as reasonably achievable. COMPARISON: X-ray left hip 01/18/2024 CLINICAL HISTORY: Hip surgical planning FINDINGS: BONES: Acute nondisplaced left subcapital femoral neck fracture. No hip dislocation. No acute fractures of the partially visualized left pelvic bones. No aggressive appearing osseous abnormality or periostitis. SOFT TISSUE: Lipohemarthrosis of the left hip (8:41). No soft tissue mass. JOINT: Lipohemarthrosis of the left hip (8:41). No significant degenerative changes. No osseous erosions. INTRAPELVIC CONTENTS: Limited images of the intrapelvic contents are unremarkable.  IMPRESSION: 1. Acute nondisplaced left subcapital femoral neck fracture with associated lipohemarthrosis. Electronically signed by: Morgane Naveau MD 01/18/2024 11:46 PM EST RP Workstation: [id number]   DG Pelvis 1-2 Views Result Date: 01/18/2024 EXAM: 1 or 2  VIEW(S) XRAY OF THE PELVIS 01/18/2024 10:26:00 PM COMPARISON: None available. CLINICAL HISTORY: Hip pain FINDINGS: BONES AND JOINTS: Impacted subcapital left femoral neck fracture is noted. Degenerative changes of the right hip. No malalignment. SOFT TISSUES: Subcutaneous soft tissue edema. IMPRESSION: 1. Impacted subcapital left femoral neck fracture. 2. Degenerative changes of the right hip. Electronically signed by: Oneil Devonshire MD 01/18/2024 10:32 PM EST RP Workstation: [id number]   DG Femur Min 2 Views Left Result Date: 01/18/2024 EXAM: 2 VIEW(S) XRAY OF THE LEFT FEMUR 01/18/2024 10:26:00 PM COMPARISON: None available. CLINICAL HISTORY: Left hip pain Left hip pain Left hip pain Left hip pain Left hip pain FINDINGS: BONES AND JOINTS: Acute impacted subcapital left femoral neck fracture. No malalignment. SOFT TISSUES: Unremarkable. IMPRESSION: 1. Acute impacted subcapital left femoral neck fracture. Electronically signed by: Oneil Devonshire MD 01/18/2024 10:31 PM EST RP Workstation: [id number]    Labs: BMET Recent Labs  Lab 01/19/24 0110 01/19/24 1515 01/20/24 0453  NA 135 135 135  K 3.4* 3.8 4.5  CL 97* 97* 97*  CO2 29 24 23   GLUCOSE 96 79 103*  BUN 16 21 28*  CREATININE 4.21* 4.99* 5.95*  CALCIUM  8.1* 8.9 8.7*  PHOS  --   --  6.5*   CBC Recent Labs  Lab 01/19/24 0110 01/19/24 0500 01/20/24 0730  WBC 5.9 5.7 6.5  NEUTROABS 4.6  --   --   HGB 9.3* 9.5* 10.3*  HCT 29.9* 30.2* 32.2*  MCV 101.0* 99.3 96.4  PLT 113* 200 213    Medications:     aspirin  EC  325 mg Oral Daily   benzonatate   200 mg Oral TID   docusate sodium   100 mg Oral BID   ferric citrate   420 mg Oral TID WC   guaiFENesin   600 mg Oral BID   metoprolol  succinate  25 mg Oral Daily   multivitamin  1 tablet Oral QHS   paricalcitol   4.5 mcg Intravenous Q T,Th,Sa-HD   sodium chloride  flush  3 mL Intravenous Q12H   sodium chloride  flush  3 mL Intravenous Q12H      Ephriam Stank, MD [facility] 01/20/2024, 11:56 AM   "

## 2024-01-20 NOTE — Progress Notes (Signed)
" °   01/20/24 1241  Vitals  Temp 98.1 F (36.7 C)  Pulse Rate 90  Resp (!) 27  BP (!) 140/70  SpO2 100 %  O2 Device Nasal Cannula  Type of Weight Post-Dialysis  Oxygen  Therapy  O2 Flow Rate (L/min) 2 L/min  Patient Activity (if Appropriate) In bed  Pulse Oximetry Type Continuous  Oximetry Probe Site Changed No  Post Treatment  Dialyzer Clearance Lightly streaked  Hemodialysis Intake (mL) 0 mL  Liters Processed 78  Fluid Removed (mL) 1000 mL  Tolerated HD Treatment Yes  AVG/AVF Arterial Site Held (minutes) 10 minutes  AVG/AVF Venous Site Held (minutes) 10 minutes     01/20/24 1241  Vitals  Temp 98.1 F (36.7 C)  Pulse Rate 90  Resp (!) 27  BP (!) 140/70  SpO2 100 %  O2 Device Nasal Cannula  Type of Weight Post-Dialysis  Oxygen  Therapy  O2 Flow Rate (L/min) 2 L/min  Patient Activity (if Appropriate) In bed  Pulse Oximetry Type Continuous  Oximetry Probe Site Changed No  Post Treatment  Dialyzer Clearance Lightly streaked  Hemodialysis Intake (mL) 0 mL  Liters Processed 78  Fluid Removed (mL) 1000 mL  Tolerated HD Treatment Yes  AVG/AVF Arterial Site Held (minutes) 10 minutes  AVG/AVF Venous Site Held (minutes) 10 minutes   Received patient in bed to unit.  Alert and oriented.  Informed consent signed and in chart.   TX duration:3.25  Patient tolerated well.  Transported back to the room  Alert, without acute distress.  Hand-off given to patient's nurse.   Access used: LUAG Access issues: no complications  Total UF removed: 1000--nephrology is aware--pt request Medication(s) given: zemplar  4.5mcg iv x 1   Delon LITTIE [facility] "
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
## 2024-01-20 NOTE — Progress Notes (Signed)
 Transition of Care [facility]) - CAGE-AID Screening   Patient Details  Name: Barbara Crawford MRN: [id number] Date of Birth: [birth year]    MARINDA LIONEL Sora, RN Phone Number: 01/20/2024, 7:42 AM   Clinical Narrative: Former tobacco user Former [place] user  Denies etoh usage No current resources needed   CAGE-AID Screening:    Have You Ever Felt You Ought to Cut Down on Your Drinking or Drug Use?: No Have People Annoyed You By [employer] Your Drinking Or Drug Use?: No Have You Felt Bad Or Guilty About Your Drinking Or Drug Use?: No Have You Ever Had a Drink or Used Drugs First Thing In The Morning to Steady Your Nerves or to Get Rid of a Hangover?: No CAGE-AID Score: 0  Substance Abuse Education Offered: No

## 2024-01-20 NOTE — Progress Notes (Signed)
 PT Cancellation Note  Patient Details Name: Barbara Crawford MRN: [id number] DOB: 1955/11/17   Cancelled Treatment:    Reason Eval/Treat Not Completed: Patient at procedure or test/unavailable (Pt off the floor at dialysis. Will follow up later if time allows.)   [name] 01/20/2024, 10:51 AM

## 2024-01-20 NOTE — Progress Notes (Signed)
 OT Cancellation Note  Patient Details Name: Barbara Crawford MRN: [id number] DOB: June 16, 1955   Cancelled Treatment:    Reason Eval/Treat Not Completed: Other (comment) Pt off floor for HD. OT to follow up as appropriate and schedule allows.  Maurilio CROME, OTR/LSABRA  [facility]  Office: [phone]   Maurilio PARAS Shanina Kepple 01/20/2024, 10:37 AM

## 2024-01-20 NOTE — Progress Notes (Signed)
 "          [facility]                                                                              Barbara Crawford, is a 69 y.o. female, DOB - [birth year], FMW:[id number] Admit date - 01/18/2024    Outpatient Primary MD for the patient is Tammy, Tari DASEN, PA-C  LOS - 2  days  Chief Complaint  Patient presents with   Hip Pain       Brief summary   Patient is a 69 year old female with ESRD on HD, TTS, chronic hypoxic respiratory failure on 2 L O2 at baseline, HFrEF 30 to 35%, CAD, HTN, anxiety, chronic dyspnea presented to ED with left leg pain and hip pain.  Patient reported that she was in the medical transport after she had finished dialysis manage stopped short and she fell into the aisle.  She had immediate left hip pain, could not get up again after sitting down.  Presented to ED where x-rays showed a left hip fracture.  Orthopedic surgery consulted. At baseline, lives at home with her daughter and not use any assistive devices.  Assessment & Plan       Closed left femoral fracture (HCC) -Orthopedics consulted, s/p Percutaneous fixation of left femoral neck on 1/16 (Dr Kendal) - Pain control, DVT prophylaxis/ASA per orthopedics. - PT, OT pending      ESRD (end stage renal disease) (HCC) on hemodialysis, TTS - Nephrology consulted, patient had completed hemodialysis on 1/15 prior to the fall - Continue HD per schedule  Essential hypertension chronic HFrEF - Continue Toprol -XL 25 mg daily    Chronic hypoxic respiratory failure - Currently stable, no wheezing, continue 2 L O2 via [place], at baseline   History of CAD -Continue Toprol -XL.   - resume aspirin     Estimated body mass index is 20.78 kg/m as calculated from the following:   Height as of this encounter: 5' 3 (1.6 m).   Weight as of this encounter: 53.2 kg.  Code Status: Full code DVT Prophylaxis:  SCDs Start: 01/19/24 1450 SCDs Start: 01/18/24 2341 Place TED hose Start: 01/18/24  2341   Level of Care: Level of care: Telemetry Family Communication: Updated patient Disposition Plan:      Remains inpatient appropriate: awaiting PT eval    Procedures:    Consultants:   Orthopedics Nephrology  Antimicrobials:   Anti-infectives (From admission, onward)    Start     Dose/Rate Route Frequency Ordered Stop   01/19/24 1545  ceFAZolin  (ANCEF ) IVPB 2g/100 mL premix  Status:  Discontinued        2 g 200 mL/hr over 30 Minutes Intravenous Every 8 hours 01/19/24 1450 01/19/24 1453   01/19/24 1100  ceFAZolin  (ANCEF ) IVPB 1 g/50 mL premix        1 g 100 mL/hr over 30 Minutes Intravenous On call to O.R. 01/19/24 1048 01/19/24 1247   01/19/24 1047  ceFAZolin  (ANCEF ) 2-4 GM/100ML-% IVPB       Note to Pharmacy: Edsel Toni HERO: cabinet override      01/19/24 1047 01/19/24 2259          Medications  aspirin  EC  325 mg Oral Daily   benzonatate   200 mg Oral TID   docusate sodium   100 mg Oral BID   ferric citrate   420 mg Oral TID WC   guaiFENesin   600 mg Oral BID   metoprolol  succinate  25 mg Oral Daily   multivitamin  1 tablet Oral QHS   paricalcitol   4.5 mcg Intravenous Q T,Th,Sa-HD   sodium chloride  flush  3 mL Intravenous Q12H   sodium chloride  flush  3 mL Intravenous Q12H      Subjective:   Barbara Crawford was seen and examined today.  No acute complaints, pain controlled.   Patient denies dizziness, chest pain, shortness of breath, abdominal pain, N/V.  Objective:   Vitals:   01/20/24 1130 01/20/24 1209 01/20/24 1240 01/20/24 1241  BP: 128/73 133/80 (!) 140/70   Pulse: 92 81 89 90  Resp: (!) 23 17 (!) 22 (!) 27  Temp:    98.1 F (36.7 C)  TempSrc:      SpO2: 100% 100% 100% 100%  Weight:      Height:        Intake/Output Summary (Last 24 hours) at 01/20/2024 1244 Last data filed at 01/19/2024 1332 Gross per 24 hour  Intake 325 ml  Output 20 ml  Net 305 ml     Wt Readings from Last 3 Encounters:  01/20/24 53.2 kg  01/02/24 54.1  kg  11/17/23 55.8 kg   Physical Exam General: Alert and oriented x 3, NAD Cardiovascular: S1 S2 clear, RRR.  Respiratory: CTAB, no wheezing Gastrointestinal: Soft, nontender, nondistended, NBS Ext: no pedal edema bilaterally Neuro: no new deficits Psych: Normal affect        Data Reviewed:  I have personally reviewed following labs    CBC Lab Results  Component Value Date   WBC 6.5 01/20/2024   RBC 3.34 (L) 01/20/2024   HGB 10.3 (L) 01/20/2024   HCT 32.2 (L) 01/20/2024   MCV 96.4 01/20/2024   MCH 30.8 01/20/2024   PLT 213 01/20/2024   MCHC 32.0 01/20/2024   RDW 15.4 01/20/2024   LYMPHSABS 0.5 (L) 01/19/2024   MONOABS 0.7 01/19/2024   EOSABS 0.0 01/19/2024   BASOSABS 0.1 01/19/2024     Last metabolic panel Lab Results  Component Value Date   NA 135 01/20/2024   K 4.5 01/20/2024   CL 97 (L) 01/20/2024   CO2 23 01/20/2024   BUN 28 (H) 01/20/2024   CREATININE 5.95 (H) 01/20/2024   GLUCOSE 103 (H) 01/20/2024   GFRNONAA 7 (L) 01/20/2024   GFRAA >60 01/28/2016   CALCIUM  8.7 (L) 01/20/2024   PHOS 6.5 (H) 01/20/2024   PROT 8.0 01/19/2024   ALBUMIN  2.9 (L) 01/20/2024   LABGLOB 4.1 (H) 03/16/2020   AGRATIO 0.6 (L) 03/16/2020   BILITOT 0.6 01/19/2024   ALKPHOS 51 01/19/2024   AST 23 01/19/2024   ALT 8 01/19/2024   ANIONGAP 15 01/20/2024    CBG (last 3)  No results for input(s): GLUCAP in the last 72 hours.    Coagulation Profile: No results for input(s): INR, PROTIME in the last 168 hours.   Radiology Studies: I have personally reviewed the imaging studies  DG HIP PORT UNILAT W OR W/O PELVIS 1V LEFT Result Date: 01/19/2024 CLINICAL DATA:  Fracture, postop. EXAM: DG HIP (WITH OR WITHOUT PELVIS) 1V PORT LEFT COMPARISON:  Preoperative imaging FINDINGS: Three screws traverse left femoral neck fracture. Unchanged fracture alignment. Recent postsurgical change includes air and edema  in the soft tissues. Degenerative change of the right hip. Probable  calcified uterine fibroids. IMPRESSION: Pinning of left femoral neck fracture. No immediate postoperative complication. Electronically Signed   By: Andrea Gasman M.D.   On: 01/19/2024 17:18   DG HIP UNILAT WITH PELVIS 2-3 VIEWS LEFT Result Date: 01/19/2024 CLINICAL DATA:  Elective surgery EXAM: DG HIP (WITH OR WITHOUT PELVIS) 2-3V LEFT COMPARISON:  Preoperative imaging FINDINGS: Six fluoroscopic spot views of the pelvis and left hip obtained in the operating room. Sequential images during hip pinning. Fluoroscopy time 1 minute 14 seconds. Dose 6.65 mGy. IMPRESSION: Intraoperative fluoroscopy during left hip pinning. Electronically Signed   By: Andrea Gasman M.D.   On: 01/19/2024 17:17   DG C-Arm 1-60 Min-No Report Result Date: 01/19/2024 Fluoroscopy was utilized by the requesting physician.  No radiographic interpretation.   CT Hip Left Wo Contrast Result Date: 01/18/2024 EXAM: CT OF THE LEFT HIP WITHOUT IV CONTRAST 01/18/2024 11:36:46 PM TECHNIQUE: CT of the left hip was performed without the administration of intravenous contrast. Multiplanar reformatted images are provided for review. Automated exposure control, iterative reconstruction, and/or weight based adjustment of the mA/kV was utilized to reduce the radiation dose to as low as reasonably achievable. COMPARISON: X-ray left hip 01/18/2024 CLINICAL HISTORY: Hip surgical planning FINDINGS: BONES: Acute nondisplaced left subcapital femoral neck fracture. No hip dislocation. No acute fractures of the partially visualized left pelvic bones. No aggressive appearing osseous abnormality or periostitis. SOFT TISSUE: Lipohemarthrosis of the left hip (8:41). No soft tissue mass. JOINT: Lipohemarthrosis of the left hip (8:41). No significant degenerative changes. No osseous erosions. INTRAPELVIC CONTENTS: Limited images of the intrapelvic contents are unremarkable. IMPRESSION: 1. Acute nondisplaced left subcapital femoral neck fracture with associated  lipohemarthrosis. Electronically signed by: Morgane Naveau MD 01/18/2024 11:46 PM EST RP Workstation: [id number]   DG Pelvis 1-2 Views Result Date: 01/18/2024 EXAM: 1 or 2 VIEW(S) XRAY OF THE PELVIS 01/18/2024 10:26:00 PM COMPARISON: None available. CLINICAL HISTORY: Hip pain FINDINGS: BONES AND JOINTS: Impacted subcapital left femoral neck fracture is noted. Degenerative changes of the right hip. No malalignment. SOFT TISSUES: Subcutaneous soft tissue edema. IMPRESSION: 1. Impacted subcapital left femoral neck fracture. 2. Degenerative changes of the right hip. Electronically signed by: Oneil Devonshire MD 01/18/2024 10:32 PM EST RP Workstation: [id number]   DG Femur Min 2 Views Left Result Date: 01/18/2024 EXAM: 2 VIEW(S) XRAY OF THE LEFT FEMUR 01/18/2024 10:26:00 PM COMPARISON: None available. CLINICAL HISTORY: Left hip pain Left hip pain Left hip pain Left hip pain Left hip pain FINDINGS: BONES AND JOINTS: Acute impacted subcapital left femoral neck fracture. No malalignment. SOFT TISSUES: Unremarkable. IMPRESSION: 1. Acute impacted subcapital left femoral neck fracture. Electronically signed by: Oneil Devonshire MD 01/18/2024 10:31 PM EST RP Workstation: [id number] Nydia Distance M.D. [facility] 01/20/2024, 12:44 PM  Available via Epic secure chat 7am-7pm After 7 pm, please refer to night coverage provider listed on amion.    "

## 2024-01-21 ENCOUNTER — Inpatient Hospital Stay (HOSPITAL_COMMUNITY)

## 2024-01-21 DIAGNOSIS — N186 End stage renal disease: Secondary | ICD-10-CM | POA: Diagnosis not present

## 2024-01-21 DIAGNOSIS — S72002A Fracture of unspecified part of neck of left femur, initial encounter for closed fracture: Secondary | ICD-10-CM | POA: Diagnosis not present

## 2024-01-21 DIAGNOSIS — J9611 Chronic respiratory failure with hypoxia: Secondary | ICD-10-CM | POA: Diagnosis not present

## 2024-01-21 DIAGNOSIS — I10 Essential (primary) hypertension: Secondary | ICD-10-CM | POA: Diagnosis not present

## 2024-01-21 LAB — CBC
HCT: 32.9 % — ABNORMAL LOW (ref 36.0–46.0)
Hemoglobin: 10.4 g/dL — ABNORMAL LOW (ref 12.0–15.0)
MCH: 31 pg (ref 26.0–34.0)
MCHC: 31.6 g/dL (ref 30.0–36.0)
MCV: 97.9 fL (ref 80.0–100.0)
Platelets: 175 K/uL (ref 150–400)
RBC: 3.36 MIL/uL — ABNORMAL LOW (ref 3.87–5.11)
RDW: 15.8 % — ABNORMAL HIGH (ref 11.5–15.5)
WBC: 5.8 K/uL (ref 4.0–10.5)
nRBC: 0 % (ref 0.0–0.2)

## 2024-01-21 MED ORDER — HYDROCODONE-ACETAMINOPHEN 5-325 MG PO TABS: 1.0000 | ORAL_TABLET | Freq: Four times a day (QID) | ORAL | 0 refills | Status: AC | PRN

## 2024-01-21 MED ORDER — ASPIRIN 325 MG PO TBEC: 325.0000 mg | DELAYED_RELEASE_TABLET | Freq: Every day | ORAL | Status: AC

## 2024-01-21 MED ORDER — VITAMIN D 25 MCG (1000 UNIT) PO TABS
2000.0000 [IU] | ORAL_TABLET | Freq: Every day | ORAL | Status: DC
  Administered 2024-01-21 – 2024-01-30 (×8): 2000 [IU] via ORAL
  Filled 2024-01-21 (×9): qty 2

## 2024-01-21 MED ORDER — VITAMIN D3 25 MCG PO TABS: 2000.0000 [IU] | ORAL_TABLET | Freq: Every day | ORAL | Status: AC

## 2024-01-21 MED ORDER — HYDROCOD POLI-CHLORPHE POLI ER 10-8 MG/5ML PO SUER
5.0000 mL | Freq: Two times a day (BID) | ORAL | Status: DC
  Filled 2024-01-21 (×6): qty 5

## 2024-01-21 NOTE — Progress Notes (Signed)
 ORTHOPAEDIC PROGRESS NOTE  s/p Procedures: FIXATION, FEMUR, NECK, PERCUTANEOUS, USING SCREW on 01/19/24 with Dr. Kendal  SUBJECTIVE: Reports mild pain about operative site, controlled with Tylenol . She worked with PT yesterday afternoon, was able to step pivot transfer to the recliner. Would like to get up again today.  No chest pain. No SOB. No nausea/vomiting. No other complaints. Tolerating diet and fluids. No BM since Friday. +Flatus  OBJECTIVE: PE: General: resting in hospital bed, NAD LLE: dressing removed, incision is CDI, tenderness to left thigh as to be expected, compartments soft and compressible, intact EHL/TA/GSC, warm well perfused foot   Vitals:   01/20/24 2013 01/21/24 0600  BP: (!) 119/58 131/64  Pulse: 84 81  Resp: 18 18  Temp: 98.4 F (36.9 C) 98.5 F (36.9 C)  SpO2: 100% 95%    Opiates Today (MME): Today's  total administered Morphine Milligram Equivalents: 0 Opiates Yesterday (MME): Yesterday's total administered Morphine Milligram Equivalents: 0 Opiates Used in the last two days:  Inpatient Morphine Milligram Equivalents Per Day 1/15 - 1/18   Values displayed are in units of MME/Day    Order Start / End Date 1/15 1/16 Yesterday Today    fentaNYL  (SUBLIMAZE ) injection 12.5-25 mcg 1/15 - 1/16 0 of 3.75-7.5 -- -- --    fentaNYL  (SUBLIMAZE ) injection 25-50 mcg 1/16 - 1/16 -- 0 of 45-90 -- --    fentaNYL  citrate (PF) (SUBLIMAZE ) injection 1/16 - 1/16 -- *30 of 30 -- --    HYDROcodone -acetaminophen  (NORCO/VICODIN) 5-325 MG per tablet 1-2 tablet 1/16 - No end date -- 0 of 10-20 0 of 20-40 0 of 20-40    HYDROmorphone  (DILAUDID ) injection 0.5 mg 1/16 - No end date -- 0 of 40 0 of 80 0 of 80    Daily Totals  0 of 3.75-7.5 * 30 of 125-180 0 of 100-120 0 of 100-120  *One-Step medication    Stable post op images  ASSESSMENT: Barbara Crawford is a 69 y.o. female POD#2  PLAN: Weightbearing: WBAT LLE Insicional and dressing care: Removed today. Change  PRN Orthopedic device(s): None Showering: Ok to begin getting incision wet 01/22/23. Do not submerge VTE prophylaxis: Aspirin  Pain control: PRN pain medication, minimize narcotics as able Blood Loss/CV: Hgb 10.4 this AM. Stable and returning to baseline. Follow - up plan: 2 weeks in office with Dr. Kendal Dispo: PT/OT evals ongoing, currently recommending SNF. Patient would prefer CIR if she is a candidate. Ortho issues stable  Contact information: After hours and holidays please check [email or web address] for group call information for [facility]   Lauraine PATRIC Moores PA-C [facility] [phone] (office) [email or web address]

## 2024-01-21 NOTE — TOC Initial Note (Signed)
 Transition of Care [facility]) - Initial/Assessment Note    Patient Details  Name: Barbara Crawford MRN: [id number] Date of Birth: 10/29/1955  [facility]) CM/SW Contact:    Cena Ligas, LCSW Phone Number: 01/21/2024, 4:11 PM  Clinical Narrative:                  CSW received SNF consult. CSW met with pt on the phone. CSW introduced self and explained role at the hospital. Pt reports that PTA she used a cane but was independent with mobility and ADL's. Pt reports she was still driving. Patient goes to HD on a TTS schedule at [facility].   CSW reviewed PT/OT recommendations for SNF. Pt reports she is not going to SNF but wants to go to CIR. LCSW will inform CIR team for review. Patient reports she lives in a 2nd story apartment with her daughter. Her daughter works outside the home.   CSW will continue to follow.   Expected Discharge Plan: Skilled Nursing Facility Barriers to Discharge: Continued Medical Work up   Patient Goals and CMS Choice Patient states their goals for this hospitalization and ongoing recovery are:: Pt would prefer to go to CIR          Expected Discharge Plan and Services       Living arrangements for the past 2 months: Apartment                                      Prior Living Arrangements/Services Living arrangements for the past 2 months: Apartment Lives with:: Adult Children Patient language and need for interpreter reviewed:: Yes Do you feel safe going back to the place where you live?: Yes      Need for Family Participation in Patient Care: Yes (Comment) Care giver support system in place?: Yes (comment) (Pt lives with daughter, daughter works outside of the home) Current home services: DME Criminal Activity/Legal Involvement Pertinent to Current Situation/Hospitalization: No - Comment as needed  Activities of Daily Living   ADL Screening (condition at time of admission) Independently performs ADLs?: No Does the  patient have a NEW difficulty with bathing/dressing/toileting/self-feeding that is expected to last >3 days?: Yes (Initiates electronic notice to provider for possible OT consult) Does the patient have a NEW difficulty with getting in/out of bed, walking, or climbing stairs that is expected to last >3 days?: Yes (Initiates electronic notice to provider for possible PT consult) Does the patient have a NEW difficulty with communication that is expected to last >3 days?: No Is the patient deaf or have difficulty hearing?: No Does the patient have difficulty seeing, even when wearing glasses/contacts?: No Does the patient have difficulty concentrating, remembering, or making decisions?: No  Permission Sought/Granted                  Emotional Assessment   Attitude/Demeanor/Rapport: Engaged Affect (typically observed): Appropriate Orientation: : Oriented to Self, Oriented to Place, Oriented to  Time, Oriented to Situation Alcohol / Substance Use: Not Applicable Psych Involvement: No (comment)  Admission diagnosis:  Closed left femoral fracture (HCC) [S72.92XA] Closed fracture of neck of left femur, initial encounter (HCC) [S72.002A] Patient Active Problem List   Diagnosis Date Noted   Closed left femoral fracture (HCC) 01/18/2024   Chronic hypoxic respiratory failure (HCC) 01/18/2024   HFrEF (heart failure with reduced ejection fraction) (HCC) 01/18/2024   History of  CAD (coronary artery disease) 01/18/2024   Generalized anxiety disorder 01/18/2024   ESRD (end stage renal disease) on dialysis (HCC) 12/30/2023   Pneumonia 12/12/2021   Acute systolic CHF (congestive heart failure) (HCC)    CHF (congestive heart failure) (HCC) 10/06/2021   ESRD (end stage renal disease) (HCC)    Acute hypoxemic respiratory failure (HCC)    Respiratory failure (HCC) 08/15/2020   Encounter for orogastric (OG) tube placement    FSGS (focal segmental glomerulosclerosis)    Pleural effusion 03/07/2020    ABLA (acute blood loss anemia) 03/06/2020   Sepsis (HCC) versus SIRS due to autoimmune process 03/05/2020   Fever    Glomerulonephritis    Perinephric hematoma 03/04/2020   Seizure (HCC) 03/04/2020   Multifocal pneumonia 03/04/2020   Acute renal failure 02/25/2020   Normocytic anemia 02/25/2020   Uremia 02/25/2020   Essential hypertension 02/25/2020   Acute bronchitis 08/16/2017   Tachycardia 08/16/2017   Stiffness of left shoulder joint 01/09/2017   Cough 02/08/2015   PCP:  Tammy Tari DASEN, PA-C Pharmacy:   [id number] [place], [place] - [address] AT [facility] [address] [place] [address] Phone: [phone] Fax: [phone]     Social Drivers of Health (SDOH) Social History: SDOH Screenings   Food Insecurity: No Food Insecurity (01/19/2024)  Housing: Low Risk (01/19/2024)  Transportation Needs: No Transportation Needs (01/19/2024)  Utilities: Not At Risk (01/19/2024)  Depression (PHQ2-9): Low Risk (03/03/2023)  Financial Resource Strain: Low Risk (04/04/2021)   Received from [facility]  Physical Activity: Unknown (04/04/2021)   Received from [facility] visits prior to 03/05/2022., [facility]  Social Connections: Moderately Isolated (01/19/2024)  Stress: No Stress Concern Present (04/04/2021)   Received from [facility] visits prior to 03/05/2022., [facility]  Tobacco Use: Low Risk (01/19/2024)  Recent Concern: Tobacco Use - Medium Risk (01/12/2024)   Received from [facility]   SDOH Interventions:     Readmission Risk Interventions     No data to display         Cena Ligas, LCSW Clinical Social Worker [facility]

## 2024-01-21 NOTE — Progress Notes (Signed)
 " Barbara Crawford Progress Note    Assessment/ Plan:   ESRD  -outpatient HD orders: [facility] TTS.  EDW 54 kg.  3.5 hours.  2K/2.5 calcium .  AVG flow rates: 400/600.  Heparin : None.  Meds: EPO 6200 units every treatment, Venofer, Zemplar  4.5 mcg every treatment.  Binders: Auryxia  3 tabs 3 times daily AC.  Has been having low albumin  therefore needs protein supplementation -HD per TTS schedule, next HD on tues   Closed femoral fracture -s/p surgery 1/16 -cont to work with PT, will need to do HD in recliner eventually   Volume/ hypertension  -UF as tolerated   Anemia of Chronic Kidney Disease -hgb 10.4 -iron  replete. R/S ESA once hgb <10 -Transfuse PRN for Hgb <7   Secondary Hyperparathyroidism/Hyperphosphatemia - check phos, auryxia  resumed  Dispo: patient wishes to be in CIR  Subjective:   Patient seen in room. Doing well. Was able to sit in chair in room yesterday for about 2 hrs She is hoping for CIR   Objective:   BP 131/64 (BP Location: Right Arm)   Pulse 81   Temp 98.5 F (36.9 C) (Oral)   Resp 18   Ht 5' 3 (1.6 m)   Wt 52.2 kg   SpO2 95%   BMI 20.39 kg/m   Intake/Output Summary (Last 24 hours) at 01/21/2024 9077 Last data filed at 01/20/2024 1241 Gross per 24 hour  Intake --  Output 1000 ml  Net -1000 ml   Weight change: -1.685 kg  Physical Exam: Gen: NAD CVS: RRR Resp: normal wob, unlabored Jai:dnqu, nt/nd Ext: no sig edema Neuro: awake, alert Dialysis access: RUE AVG  Imaging: DG HIP PORT UNILAT W OR W/O PELVIS 1V LEFT Result Date: 01/19/2024 CLINICAL DATA:  Fracture, postop. EXAM: DG HIP (WITH OR WITHOUT PELVIS) 1V PORT LEFT COMPARISON:  Preoperative imaging FINDINGS: Three screws traverse left femoral neck fracture. Unchanged fracture alignment. Recent postsurgical change includes air and edema in the soft tissues. Degenerative change of the right hip. Probable calcified uterine fibroids. IMPRESSION: Pinning of left femoral neck  fracture. No immediate postoperative complication. Electronically Signed   By: Andrea Gasman M.D.   On: 01/19/2024 17:18   DG HIP UNILAT WITH PELVIS 2-3 VIEWS LEFT Result Date: 01/19/2024 CLINICAL DATA:  Elective surgery EXAM: DG HIP (WITH OR WITHOUT PELVIS) 2-3V LEFT COMPARISON:  Preoperative imaging FINDINGS: Six fluoroscopic spot views of the pelvis and left hip obtained in the operating room. Sequential images during hip pinning. Fluoroscopy time 1 minute 14 seconds. Dose 6.65 mGy. IMPRESSION: Intraoperative fluoroscopy during left hip pinning. Electronically Signed   By: Andrea Gasman M.D.   On: 01/19/2024 17:17   DG C-Arm 1-60 Min-No Report Result Date: 01/19/2024 Fluoroscopy was utilized by the requesting physician.  No radiographic interpretation.    Labs: BMET Recent Labs  Lab 01/19/24 0110 01/19/24 1515 01/20/24 0453 01/20/24 1356  NA 135 135 135 132*  K 3.4* 3.8 4.5 3.7  CL 97* 97* 97* 95*  CO2 29 24 23 27   GLUCOSE 96 79 103* 70  BUN 16 21 28* 12  CREATININE 4.21* 4.99* 5.95* 3.00*  CALCIUM  8.1* 8.9 8.7* 8.3*  PHOS  --   --  6.5*  --    CBC Recent Labs  Lab 01/19/24 0110 01/19/24 0500 01/20/24 0730 01/20/24 1356 01/21/24 0502  WBC 5.9 5.7 6.5 5.6 5.8  NEUTROABS 4.6  --   --   --   --   HGB 9.3* 9.5* 10.3* 10.7*  10.4*  HCT 29.9* 30.2* 32.2* 33.1* 32.9*  MCV 101.0* 99.3 96.4 96.5 97.9  PLT 113* 200 213 177 175    Medications:     aspirin  EC  325 mg Oral Daily   benzonatate   200 mg Oral TID   cholecalciferol   2,000 Units Oral Daily   docusate sodium   100 mg Oral BID   ferric citrate   420 mg Oral TID WC   guaiFENesin   600 mg Oral BID   metoprolol  succinate  25 mg Oral Daily   multivitamin  1 tablet Oral QHS   paricalcitol   4.5 mcg Intravenous Q T,Th,Sa-HD   sodium chloride  flush  3 mL Intravenous Q12H   sodium chloride  flush  3 mL Intravenous Q12H      Ephriam Stank, MD [facility] 01/21/2024, 9:22 AM   "

## 2024-01-21 NOTE — Progress Notes (Signed)
 Mobility Specialist Progress Note:    01/21/24 1205  Mobility  Activity Ambulated with assistance (In room)  Level of Assistance Minimal assist, patient does 75% or more (+2 and chair follow)  Assistive Device Front wheel walker  Distance Ambulated (ft) 8 ft  LLE Weight Bearing Per Provider Order WBAT  Activity Response Tolerated well  Mobility Referral Yes  Mobility visit 1 Mobility  Mobility Specialist Start Time (ACUTE ONLY) 1150  Mobility Specialist Stop Time (ACUTE ONLY) 1205  Mobility Specialist Time Calculation (min) (ACUTE ONLY) 15 min   Received pt in bed and eager for mobility. Pt required MinA +2 to EOB/ STS and chair follow for safety. Pt able to take steps in room. Left pt in chair with personal belongings and call light within reach. All needs met.  Lavanda Pollack Mobility Specialist  Please contact via [employer] or  Rehab Office [phone]

## 2024-01-21 NOTE — Discharge Instructions (Signed)
 "  Orthopaedic Trauma Service Discharge Instructions   General Discharge Instructions  WEIGHT BEARING STATUS:Weightbearing as tolerated  RANGE OF MOTION/ACTIVITY: OK for hip and knee motion as tolerated  Wound Care: You may remove your surgical dressing on post op day 2 (Sunday 01/21/24). Incisions can be left open to air if there is no drainage. Once the incision is completely dry and without drainage, it may be left open to air out.  Showering may begin post op day 3 (Monday 01/22/24).  Clean incision gently with soap and water.  DVT/PE prophylaxis: Aspirin   Diet: as you were eating previously.  Can use over the counter stool softeners and bowel preparations, such as Miralax , to help with bowel movements.  Narcotics can be constipating.  Be sure to drink plenty of fluids  PAIN MEDICATION USE AND EXPECTATIONS  You have likely been given narcotic medications to help control your pain.  After a traumatic event that results in an fracture (broken bone) with or without surgery, it is ok to use narcotic pain medications to help control one's pain.  We understand that everyone responds to pain differently and each individual patient will be evaluated on a regular basis for the continued need for narcotic medications. Ideally, narcotic medication use should last no more than 6-8 weeks (coinciding with fracture healing).   As a patient it is your responsibility as well to monitor narcotic medication use and report the amount and frequency you use these medications when you come to your office visit.   We would also advise that if you are using narcotic medications, you should take a dose prior to therapy to maximize you participation.  IF YOU ARE ON NARCOTIC MEDICATIONS IT IS NOT PERMISSIBLE TO OPERATE A MOTOR VEHICLE (MOTORCYCLE/CAR/TRUCK/MOPED) OR HEAVY MACHINERY DO NOT MIX NARCOTICS WITH OTHER CNS (CENTRAL NERVOUS SYSTEM) DEPRESSANTS SUCH AS ALCOHOL  POST-OPERATIVE OPIOID TAPER INSTRUCTIONS: It is  important to wean off of your opioid medication as soon as possible. If you do not need pain medication after your surgery it is ok to stop day one. Opioids include: Codeine, Hydrocodone (Norco, Vicodin), Oxycodone (Percocet, oxycontin ) and hydromorphone  amongst others.  Long term and even short term use of opiods can cause: Increased pain response Dependence Constipation Depression Respiratory depression And more.  Withdrawal symptoms can include Flu like symptoms Nausea, vomiting And more Techniques to manage these symptoms Hydrate well Eat regular healthy meals Stay active Use relaxation techniques(deep breathing, meditating, yoga) Do Not substitute Alcohol to help with tapering If you have been on opioids for less than two weeks and do not have pain than it is ok to stop all together.  Plan to wean off of opioids This plan should start within one week post op of your fracture surgery  Maintain the same interval or time between taking each dose and first decrease the dose.  Cut the total daily intake of opioids by one tablet each day Next start to increase the time between doses. The last dose that should be eliminated is the evening dose.    STOP SMOKING OR USING NICOTINE PRODUCTS!!!!  As discussed nicotine severely impairs your body's ability to heal surgical and traumatic wounds but also impairs bone healing.  Wounds and bone heal by forming microscopic blood vessels (angiogenesis) and nicotine is a vasoconstrictor (essentially, shrinks blood vessels).  Therefore, if vasoconstriction occurs to these microscopic blood vessels they essentially disappear and are unable to deliver necessary nutrients to the healing tissue.  This is one modifiable factor that you  can do to dramatically increase your chances of healing your injury.  (This means no smoking, no nicotine gum, patches, etc)  DO NOT USE NONSTEROIDAL ANTI-INFLAMMATORY DRUGS (NSAID'S)  Using products such as Advil   (ibuprofen ), Aleve (naproxen), Motrin  (ibuprofen ) for additional pain control during fracture healing can delay and/or prevent the healing response.  If you would like to take over the counter (OTC) medication, Tylenol  (acetaminophen ) is ok.  However, some narcotic medications that are given for pain control contain acetaminophen  as well. Therefore, you should not exceed more than 4000 mg of tylenol  in a day if you do not have liver disease.  Also note that there are may OTC medicines, such as cold medicines and allergy medicines that my contain tylenol  as well.  If you have any questions about medications and/or interactions please ask your doctor/PA or your pharmacist.      ICE AND ELEVATE INJURED/OPERATIVE EXTREMITY  Using ice and elevating the injured extremity above your heart can help with swelling and pain control.  Icing in a pulsatile fashion, such as 20 minutes on and 20 minutes off, can be followed.    Do not place ice directly on skin. Make sure there is a barrier between to skin and the ice pack.    Using frozen items such as frozen peas works well as the conform nicely to the are that needs to be iced.  USE AN ACE WRAP OR TED HOSE FOR SWELLING CONTROL  In addition to icing and elevation, Ace wraps or TED hose are used to help limit and resolve swelling.  It is recommended to use Ace wraps or TED hose until you are informed to stop.    When using Ace Wraps start the wrapping distally (farthest away from the body) and wrap proximally (closer to the body)   Example: If you had surgery on your leg or thing and you do not have a splint on, start the ace wrap at the toes and work your way up to the thigh        If you had surgery on your upper extremity and do not have a splint on, start the ace wrap at your fingers and work your way up to the upper arm  CALL THE OFFICE FOR MEDICATION REFILLS OR WITH ANY QUESTIONS/CONCERNS: [phone]   VISIT OUR WEBSITE FOR ADDITIONAL INFORMATION:  [email or web address]   Discharge Wound Care Instructions  Do NOT apply any ointments, solutions or lotions to pin sites or surgical wounds.  These prevent needed drainage and even though solutions like hydrogen peroxide kill bacteria, they also damage cells lining the pin sites that help fight infection.  Applying lotions or ointments can keep the wounds moist and can cause them to breakdown and open up as well. This can increase the risk for infection. When in doubt call the office.  Surgical incisions should be dressed daily.  If any drainage is noted, use foam dressing - These dressing supplies should be available at local medical supply stores [facility], [facility], etc) as well as insurance claims handler ([employer], [facility], [employer], etc)   Call office for the following: Temperature greater than 101F Persistent nausea and vomiting Severe uncontrolled pain Redness, tenderness, or signs of infection (pain, swelling, redness, odor or green/yellow discharge around the site) Difficulty breathing, headache or visual disturbances Hives Persistent dizziness or light-headedness Extreme fatigue Any other questions or concerns you may have after discharge  In an emergency, call 911 or go to an Emergency Department at a nearby  hospital  OTHER HELPFUL INFORMATION  If you had a block, it will wear off between 8-24 hrs postop typically.  This is period when your pain may go from nearly zero to the pain you would have had postop without the block.  This is an abrupt transition but nothing dangerous is happening.  You may take an extra dose of narcotic when this happens.  You should wean off your narcotic medicines as soon as you are able.  Most patients will be off or using minimal narcotics before their first postop appointment.   We suggest you use the pain medication the first night prior to going to bed, in order to ease any pain when the anesthesia wears off. You should avoid taking pain  medications on an empty stomach as it will make you nauseous.  Do not drink alcoholic beverages or take illicit drugs when taking pain medications.  In most states it is against the law to drive while you are in a splint or sling.  And certainly against the law to drive while taking narcotics.  You may return to work/school in the next couple of days when you feel up to it.   Pain medication may make you constipated.  Below are a few solutions to try in this order: Decrease the amount of pain medication if you arent having pain. Drink lots of decaffeinated fluids. Drink prune juice and/or each dried prunes  If the first 3 dont work start with additional solutions Take Colace - an over-the-counter stool softener Take Senokot - an over-the-counter laxative Take Miralax  - a stronger over-the-counter laxative     "

## 2024-01-21 NOTE — Progress Notes (Signed)
 Mobility Specialist Progress Note:    01/21/24 1210  Mobility  Activity Pivoted/transferred from bed to chair  Level of Assistance Minimal assist, patient does 75% or more (+2)  Assistive Device Front wheel walker  Distance Ambulated (ft) 3 ft  Range of Motion/Exercises Active  LLE Weight Bearing Per Provider Order WBAT  Activity Response Tolerated well  Mobility Referral Yes  Mobility visit 1 Mobility  Mobility Specialist Start Time (ACUTE ONLY) 1210  Mobility Specialist Stop Time (ACUTE ONLY) 1225  Mobility Specialist Time Calculation (min) (ACUTE ONLY) 15 min   Received pt laying in bed agreeable to session. Pt feeling anxious about possible leg pain and falling needing reassurance and taking time. Pt able to transfer to EOB w/ light CGA. Pt able to stand twice using light CGA. Pt able to stand and take small steps over to chair. Left pt in recliner w/ all needs met.   Venetia Keel Mobility Specialist Please Neurosurgeon or Rehab Office at [phone]

## 2024-01-21 NOTE — Progress Notes (Signed)
 "          [facility]                                                                              Barbara Crawford, is a 69 y.o. female, DOB - February 17, 1955, FMW:[id number] Admit date - 01/18/2024    Outpatient Primary MD for the patient is Tammy Rasmussen T, PA-C  LOS - 3  days  Chief Complaint  Patient presents with   Hip Pain       Brief summary   Patient is a 69 year old female with ESRD on HD, TTS, chronic hypoxic respiratory failure on 2 L O2 at baseline, HFrEF 30 to 35%, CAD, HTN, anxiety, chronic dyspnea presented to ED with left leg pain and hip pain.  Patient reported that she was in the medical transport after she had finished dialysis that stopped short and she fell into the aisle.  She had immediate left hip pain, could not get up again after sitting down.  Presented to ED where x-rays showed a left hip fracture.  Orthopedic surgery consulted. At baseline, lives at home with her daughter and not use any assistive devices.  Assessment & Plan       Closed left femoral fracture (HCC) -Orthopedics consulted, s/p Percutaneous fixation of left femoral neck on 1/16 (Dr Kendal) - Continue pain control, aspirin  for DVT prophylaxis - H&H stable - Per orthopedics, WBAT LLE, continue PT, 2 weeks follow-up with Dr. Kendal - Patient now prefers going to CIR-consult placed for evaluation     ESRD (end stage renal disease) (HCC) on hemodialysis, TTS - Nephrology consulted, patient had completed hemodialysis on 1/15 prior to the fall - Continue HD per schedule  Essential hypertension chronic HFrEF - Continue Toprol -XL 25 mg daily    Chronic hypoxic respiratory failure - Currently stable, no wheezing, continue 2 L O2 via [place], at baseline   History of CAD -Continue Toprol -XL.   - resume aspirin    Cough - Likely residual from recent pneumonia 3 weeks ago, was admitted 12/30/2023 -01/02/2024, completed the course of antibiotics -will check chest x-ray. -   Continue  Tessalon  Perles, Mucinex , added Tussionex q12   Estimated body mass index is 20.39 kg/m as calculated from the following:   Height as of this encounter: 5' 3 (1.6 m).   Weight as of this encounter: 52.2 kg.  Code Status: Full code DVT Prophylaxis:  SCDs Start: 01/19/24 1450 SCDs Start: 01/18/24 2341 Place TED hose Start: 01/18/24 2341   Level of Care: Level of care: Telemetry Family Communication: Updated patient Disposition Plan:      Remains inpatient appropriate: PT evaluation recommended SNF, patient requesting CIR   Procedures:    Consultants:   Orthopedics Nephrology  Antimicrobials:   Anti-infectives (From admission, onward)    Start     Dose/Rate Route Frequency Ordered Stop   01/19/24 1545  ceFAZolin  (ANCEF ) IVPB 2g/100 mL premix  Status:  Discontinued        2 g 200 mL/hr over 30 Minutes Intravenous Every 8 hours 01/19/24 1450 01/19/24 1453   01/19/24 1100  ceFAZolin  (ANCEF ) IVPB 1 g/50 mL premix        1  g 100 mL/hr over 30 Minutes Intravenous On call to O.R. 01/19/24 1048 01/19/24 1247   01/19/24 1047  ceFAZolin  (ANCEF ) 2-4 GM/100ML-% IVPB       Note to Pharmacy: Edsel Toni HERO: cabinet override      01/19/24 1047 01/19/24 2259          Medications  aspirin  EC  325 mg Oral Daily   benzonatate   200 mg Oral TID   cholecalciferol   2,000 Units Oral Daily   docusate sodium   100 mg Oral BID   ferric citrate   420 mg Oral TID WC   guaiFENesin   600 mg Oral BID   metoprolol  succinate  25 mg Oral Daily   multivitamin  1 tablet Oral QHS   paricalcitol   4.5 mcg Intravenous Q T,Th,Sa-HD   sodium chloride  flush  3 mL Intravenous Q12H   sodium chloride  flush  3 mL Intravenous Q12H      Subjective:   Barbara Crawford was seen and examined today.  No acute complaints, pain is controlled, asking about CIR, still having cough.  No chest pain or acute shortness of breath.  No fevers.   Objective:   Vitals:   01/20/24 1241 01/20/24 1407 01/20/24  2013 01/21/24 0600  BP: (!) 140/70 134/66 (!) 119/58 131/64  Pulse: 90 94 84 81  Resp: (!) 27 16 18 18   Temp: 98.1 F (36.7 C) 98.9 F (37.2 C) 98.4 F (36.9 C) 98.5 F (36.9 C)  TempSrc:   Oral Oral  SpO2: 100% 100% 100% 95%  Weight: 52.2 kg     Height:       No intake or output data in the 24 hours ending 01/21/24 1311    Wt Readings from Last 3 Encounters:  01/20/24 52.2 kg  01/02/24 54.1 kg  11/17/23 55.8 kg    Physical Exam General: Alert and oriented x 3, NAD Cardiovascular: S1 S2 clear, RRR.  Respiratory: CTAB, no wheezing Gastrointestinal: Soft, nontender, nondistended, NBS Ext: no pedal edema bilaterally Neuro: no new deficits Psych: Normal affect       Data Reviewed:  I have personally reviewed following labs    CBC Lab Results  Component Value Date   WBC 5.8 01/21/2024   RBC 3.36 (L) 01/21/2024   HGB 10.4 (L) 01/21/2024   HCT 32.9 (L) 01/21/2024   MCV 97.9 01/21/2024   MCH 31.0 01/21/2024   PLT 175 01/21/2024   MCHC 31.6 01/21/2024   RDW 15.8 (H) 01/21/2024   LYMPHSABS 0.5 (L) 01/19/2024   MONOABS 0.7 01/19/2024   EOSABS 0.0 01/19/2024   BASOSABS 0.1 01/19/2024     Last metabolic panel Lab Results  Component Value Date   NA 132 (L) 01/20/2024   K 3.7 01/20/2024   CL 95 (L) 01/20/2024   CO2 27 01/20/2024   BUN 12 01/20/2024   CREATININE 3.00 (H) 01/20/2024   GLUCOSE 70 01/20/2024   GFRNONAA 16 (L) 01/20/2024   GFRAA >60 01/28/2016   CALCIUM  8.3 (L) 01/20/2024   PHOS 6.5 (H) 01/20/2024   PROT 8.0 01/19/2024   ALBUMIN  2.9 (L) 01/20/2024   LABGLOB 4.1 (H) 03/16/2020   AGRATIO 0.6 (L) 03/16/2020   BILITOT 0.6 01/19/2024   ALKPHOS 51 01/19/2024   AST 23 01/19/2024   ALT 8 01/19/2024   ANIONGAP 10 01/20/2024    CBG (last 3)  No results for input(s): GLUCAP in the last 72 hours.    Coagulation Profile: No results for input(s): INR, PROTIME in the last 168  hours.   Radiology Studies: I have personally reviewed the  imaging studies  DG HIP PORT UNILAT W OR W/O PELVIS 1V LEFT Result Date: 01/19/2024 CLINICAL DATA:  Fracture, postop. EXAM: DG HIP (WITH OR WITHOUT PELVIS) 1V PORT LEFT COMPARISON:  Preoperative imaging FINDINGS: Three screws traverse left femoral neck fracture. Unchanged fracture alignment. Recent postsurgical change includes air and edema in the soft tissues. Degenerative change of the right hip. Probable calcified uterine fibroids. IMPRESSION: Pinning of left femoral neck fracture. No immediate postoperative complication. Electronically Signed   By: Andrea Gasman M.D.   On: 01/19/2024 17:18   DG HIP UNILAT WITH PELVIS 2-3 VIEWS LEFT Result Date: 01/19/2024 CLINICAL DATA:  Elective surgery EXAM: DG HIP (WITH OR WITHOUT PELVIS) 2-3V LEFT COMPARISON:  Preoperative imaging FINDINGS: Six fluoroscopic spot views of the pelvis and left hip obtained in the operating room. Sequential images during hip pinning. Fluoroscopy time 1 minute 14 seconds. Dose 6.65 mGy. IMPRESSION: Intraoperative fluoroscopy during left hip pinning. Electronically Signed   By: Andrea Gasman M.D.   On: 01/19/2024 17:17   DG C-Arm 1-60 Min-No Report Result Date: 01/19/2024 Fluoroscopy was utilized by the requesting physician.  No radiographic interpretation.       Nydia Distance M.D. [facility] 01/21/2024, 1:11 PM  Available via Epic secure chat 7am-7pm After 7 pm, please refer to night coverage provider listed on amion.    "

## 2024-01-22 ENCOUNTER — Encounter (HOSPITAL_COMMUNITY): Payer: Self-pay | Admitting: Student

## 2024-01-22 DIAGNOSIS — S72002A Fracture of unspecified part of neck of left femur, initial encounter for closed fracture: Secondary | ICD-10-CM | POA: Diagnosis not present

## 2024-01-22 DIAGNOSIS — N186 End stage renal disease: Secondary | ICD-10-CM | POA: Diagnosis not present

## 2024-01-22 DIAGNOSIS — J9611 Chronic respiratory failure with hypoxia: Secondary | ICD-10-CM | POA: Diagnosis not present

## 2024-01-22 DIAGNOSIS — I10 Essential (primary) hypertension: Secondary | ICD-10-CM | POA: Diagnosis not present

## 2024-01-22 LAB — CBC
HCT: 29.4 % — ABNORMAL LOW (ref 36.0–46.0)
Hemoglobin: 9.3 g/dL — ABNORMAL LOW (ref 12.0–15.0)
MCH: 31.2 pg (ref 26.0–34.0)
MCHC: 31.6 g/dL (ref 30.0–36.0)
MCV: 98.7 fL (ref 80.0–100.0)
Platelets: 181 K/uL (ref 150–400)
RBC: 2.98 MIL/uL — ABNORMAL LOW (ref 3.87–5.11)
RDW: 15.4 % (ref 11.5–15.5)
WBC: 4.2 K/uL (ref 4.0–10.5)
nRBC: 0 % (ref 0.0–0.2)

## 2024-01-22 NOTE — Progress Notes (Signed)
 Pt receives out-pt HD at [facility] on TTS 10:30 am chair time. Will assist as needed.   Randine Mungo Dialysis Navigator [phone]

## 2024-01-22 NOTE — Progress Notes (Signed)
 IP rehab admissions - I spoke with patient and she prefers inpatient rehab stay.  She has had a bad experience with SNF with her husband recently.  I called her daughter.  Dtr and son both work.  Dtr is going to check with her employer and see if she can take some time off to stay with patient after a potential CIR stay.  Dtr to call me back after she speaks with her employer.  We would then need pre-auth prior to a rehab stay.  [phone]

## 2024-01-22 NOTE — Progress Notes (Signed)
 Inpatient Rehab Admissions Coordinator:   Per pt request pt was screened for CIR by Reche Lowers, PT, DPT.  Note admitted from home following a fall with L femoral neck fracture, now s/p percutaneous fixation and WBAT on LLE.  She carries the comorbidities of ESRD on HD, chronic hypoxic respiratory failure on 2L O2, and heart failure with EF of 30-35%.  With therapy evaluations she was min assist of 2 to transfer to the bedside chair, but was somewhat limited by pain.  I do think she could potentially be a rehab candidate if she demonstrates good progression with therapy.  We will rescreen after her next therapy session for assessment.    Reche Lowers, PT, DPT Admissions Coordinator [phone] 01/22/24 12:15 PM

## 2024-01-22 NOTE — Care Management Important Message (Signed)
 Important Message  Patient Details  Name: Barbara Crawford MRN: [id number] Date of Birth: 26-Nov-1955   Important Message Given:  Yes - Medicare IM     [name] 01/22/2024, 3:20 PM

## 2024-01-22 NOTE — Progress Notes (Signed)
 "          [facility]                                                                              Barbara Crawford, is a 69 y.o. female, DOB - [birth year], FMW:[id number] Admit date - 01/18/2024    Outpatient Primary MD for the patient is Tammy Rasmussen T, PA-C  LOS - 4  days  Chief Complaint  Patient presents with   Hip Pain       Brief summary   Patient is a 69 year old female with ESRD on HD, TTS, chronic hypoxic respiratory failure on 2 L O2 at baseline, HFrEF 30 to 35%, CAD, HTN, anxiety, chronic dyspnea presented to ED with left leg pain and hip pain.  Patient reported that she was in the medical transport after she had finished dialysis that stopped short and she fell into the aisle.  She had immediate left hip pain, could not get up again after sitting down.  Presented to ED where x-rays showed a left hip fracture.  Orthopedic surgery consulted. At baseline, lives at home with her daughter and not use any assistive devices.  Assessment & Plan       Closed left femoral fracture (HCC) -Orthopedics consulted, s/p Percutaneous fixation of left femoral neck on 1/16 (Dr Kendal) - Continue pain control, aspirin  for DVT prophylaxis - Hemoglobin 9.3, stable - Per orthopedics, WBAT LLE, continue PT, 2 weeks follow-up with Dr. Kendal - Patient prefers going to CIR, consult placed     ESRD (end stage renal disease) (HCC) on hemodialysis, TTS - Nephrology consulted, patient had completed hemodialysis on 1/15 prior to the fall - Continue HD per schedule  Vitamin D  deficiency - Vitamin D  8.0, continue vitamin D  2000 units daily supplement  Essential hypertension chronic HFrEF - Continue Toprol -XL 25 mg daily    Chronic hypoxic respiratory failure - Currently stable, no wheezing, continue 2 L O2 via [place], at baseline   History of CAD -Continue Toprol -XL, aspirin .    Cough - , was admitted 12/30/2023 -01/02/2024 for pneumonia, completed the course of  antibiotics - Chest x-ray showed left-sided pneumonia with pleural effusion, likely residual from recent pneumonia. No fever or chills, respiratory symptoms except cough, no leukocytosis.  No need of antibiotics.  Estimated body mass index is 20.39 kg/m as calculated from the following:   Height as of this encounter: 5' 3 (1.6 m).   Weight as of this encounter: 52.2 kg.  Code Status: Full code DVT Prophylaxis:  SCDs Start: 01/19/24 1450 SCDs Start: 01/18/24 2341 Place TED hose Start: 01/18/24 2341   Level of Care: Level of care: Telemetry Family Communication: Updated patient Disposition Plan:      Remains inpatient appropriate: PT evaluation recommended SNF, patient requesting CIR   Procedures:    Consultants:   Orthopedics Nephrology  Antimicrobials:   Anti-infectives (From admission, onward)    Start     Dose/Rate Route Frequency Ordered Stop   01/19/24 1545  ceFAZolin  (ANCEF ) IVPB 2g/100 mL premix  Status:  Discontinued        2 g 200 mL/hr over 30 Minutes Intravenous Every 8 hours 01/19/24 1450 01/19/24 1453  01/19/24 1100  ceFAZolin  (ANCEF ) IVPB 1 g/50 mL premix        1 g 100 mL/hr over 30 Minutes Intravenous On call to O.R. 01/19/24 1048 01/19/24 1247   01/19/24 1047  ceFAZolin  (ANCEF ) 2-4 GM/100ML-% IVPB       Note to Pharmacy: Edsel Toni HERO: cabinet override      01/19/24 1047 01/19/24 2259          Medications  aspirin  EC  325 mg Oral Daily   benzonatate   200 mg Oral TID   chlorpheniramine-HYDROcodone   5 mL Oral Q12H   cholecalciferol   2,000 Units Oral Daily   docusate sodium   100 mg Oral BID   ferric citrate   420 mg Oral TID WC   guaiFENesin   600 mg Oral BID   metoprolol  succinate  25 mg Oral Daily   multivitamin  1 tablet Oral QHS   paricalcitol   4.5 mcg Intravenous Q T,Th,Sa-HD   sodium chloride  flush  3 mL Intravenous Q12H   sodium chloride  flush  3 mL Intravenous Q12H      Subjective:   [name] was seen and examined  today.  No acute complaints.   Objective:   Vitals:   01/21/24 1548 01/21/24 1934 01/22/24 0236 01/22/24 0734  BP: 126/64 (!) 105/46 120/65 135/64  Pulse: 80 79 81 78  Resp:  17 17 17   Temp:  98.2 F (36.8 C) 97.7 F (36.5 C) 98.1 F (36.7 C)  TempSrc: Oral Oral Oral Oral  SpO2: 100% 100% 100% 99%  Weight:      Height:        Intake/Output Summary (Last 24 hours) at 01/22/2024 1237 Last data filed at 01/22/2024 0900 Gross per 24 hour  Intake 360 ml  Output --  Net 360 ml      Wt Readings from Last 3 Encounters:  01/20/24 52.2 kg  01/02/24 54.1 kg  11/17/23 55.8 kg   Physical Exam General: Alert and oriented x 3, NAD Cardiovascular: S1 S2 clear, RRR.  Respiratory: CTAB, no wheezing Gastrointestinal: Soft, nontender, nondistended, NBS Ext: no pedal edema bilaterally Neuro: no new deficits Psych: Normal affect      Data Reviewed:  I have personally reviewed following labs    CBC Lab Results  Component Value Date   WBC 4.2 01/22/2024   RBC 2.98 (L) 01/22/2024   HGB 9.3 (L) 01/22/2024   HCT 29.4 (L) 01/22/2024   MCV 98.7 01/22/2024   MCH 31.2 01/22/2024   PLT 181 01/22/2024   MCHC 31.6 01/22/2024   RDW 15.4 01/22/2024   LYMPHSABS 0.5 (L) 01/19/2024   MONOABS 0.7 01/19/2024   EOSABS 0.0 01/19/2024   BASOSABS 0.1 01/19/2024     Last metabolic panel Lab Results  Component Value Date   NA 132 (L) 01/20/2024   K 3.7 01/20/2024   CL 95 (L) 01/20/2024   CO2 27 01/20/2024   BUN 12 01/20/2024   CREATININE 3.00 (H) 01/20/2024   GLUCOSE 70 01/20/2024   GFRNONAA 16 (L) 01/20/2024   GFRAA >60 01/28/2016   CALCIUM  8.3 (L) 01/20/2024   PHOS 6.5 (H) 01/20/2024   PROT 8.0 01/19/2024   ALBUMIN  2.9 (L) 01/20/2024   LABGLOB 4.1 (H) 03/16/2020   AGRATIO 0.6 (L) 03/16/2020   BILITOT 0.6 01/19/2024   ALKPHOS 51 01/19/2024   AST 23 01/19/2024   ALT 8 01/19/2024   ANIONGAP 10 01/20/2024    CBG (last 3)  No results for input(s): GLUCAP in the last 72  hours.    Coagulation Profile: No results for input(s): INR, PROTIME in the last 168 hours.   Radiology Studies: I have personally reviewed the imaging studies  DG Chest Port 1 View Result Date: 01/21/2024 CLINICAL DATA:  Cough. EXAM: PORTABLE CHEST 1 VIEW COMPARISON:  12/30/2023 FINDINGS: The heart is enlarged. Mediastinal contours are stable. Ill-defined left lung base opacity with possible left pleural effusion. The previous nodular density in the left upper lung zone is not definitively seen on the current exam. No pulmonary edema. No pneumothorax. IMPRESSION: 1. Ill-defined left lung base opacity with possible left pleural effusion. This may represent pneumonia in the appropriate clinical setting. 2. Cardiomegaly. Electronically Signed   By: Andrea Gasman M.D.   On: 01/21/2024 13:44       Ghassan Coggeshall M.D. [facility] 01/22/2024, 12:37 PM  Available via Epic secure chat 7am-7pm After 7 pm, please refer to night coverage provider listed on amion.    "

## 2024-01-22 NOTE — Plan of Care (Signed)
  Problem: Education: Goal: Knowledge of General Education information will improve Description: Including pain rating scale, medication(s)/side effects and non-pharmacologic comfort measures Outcome: Progressing   Problem: Clinical Measurements: Goal: Will remain free from infection Outcome: Progressing Goal: Diagnostic test results will improve Outcome: Progressing   Problem: Activity: Goal: Risk for activity intolerance will decrease Outcome: Progressing   Problem: Coping: Goal: Level of anxiety will decrease Outcome: Progressing   

## 2024-01-22 NOTE — Progress Notes (Addendum)
 " [facility] Progress Note   Subjective:    Seen and examined patient at bedside. She became tearful when telling me the reason she was admitted here. Otherwise, is doing fine with no acute complaints. Next HD 1/20. She is wanting to go to inpatient rehab and not a SNF.  Objective Vitals:   01/21/24 1548 01/21/24 1934 01/22/24 0236 01/22/24 0734  BP: 126/64 (!) 105/46 120/65 135/64  Pulse: 80 79 81 78  Resp:  17 17 17   Temp:  98.2 F (36.8 C) 97.7 F (36.5 C) 98.1 F (36.7 C)  TempSrc: Oral Oral Oral Oral  SpO2: 100% 100% 100% 99%  Weight:      Height:       Physical Exam Gen: Awake, alert, NAD CVS: RRR Resp: normal wob, unlabored Barbara Crawford:dnqu, nt/nd Ext: no sig edema Neuro: awake, alert Dialysis access: RUE AVG  Filed Weights   01/19/24 1103 01/20/24 0907 01/20/24 1241  Weight: 54.9 kg 53.2 kg 52.2 kg    Intake/Output Summary (Last 24 hours) at 01/22/2024 1014 Last data filed at 01/21/2024 1700 Gross per 24 hour  Intake 240 ml  Output --  Net 240 ml    Additional Objective Labs: Basic Metabolic Panel: Recent Labs  Lab 01/19/24 1515 01/20/24 0453 01/20/24 1356  NA 135 135 132*  K 3.8 4.5 3.7  CL 97* 97* 95*  CO2 24 23 27   GLUCOSE 79 103* 70  BUN 21 28* 12  CREATININE 4.99* 5.95* 3.00*  CALCIUM  8.9 8.7* 8.3*  PHOS  --  6.5*  --    Liver Function Tests: Recent Labs  Lab 01/19/24 1515 01/20/24 0453  AST 23  --   ALT 8  --   ALKPHOS 51  --   BILITOT 0.6  --   PROT 8.0  --   ALBUMIN  3.1* 2.9*   No results for input(s): LIPASE, AMYLASE in the last 168 hours. CBC: Recent Labs  Lab 01/19/24 0110 01/19/24 0500 01/20/24 0730 01/20/24 1356 01/21/24 0502 01/22/24 0500  WBC 5.9 5.7 6.5 5.6 5.8 4.2  NEUTROABS 4.6  --   --   --   --   --   HGB 9.3* 9.5* 10.3* 10.7* 10.4* 9.3*  HCT 29.9* 30.2* 32.2* 33.1* 32.9* 29.4*  MCV 101.0* 99.3 96.4 96.5 97.9 98.7  PLT 113* 200 213 177 175 181   Blood Culture    Component Value Date/Time    SDES BLOOD RIGHT ARM 01/01/2024 1316   SDES BLOOD RIGHT ARM 01/01/2024 1316   SPECREQUEST  01/01/2024 1316    BOTTLES DRAWN AEROBIC ONLY Blood Culture adequate volume   SPECREQUEST  01/01/2024 1316    BOTTLES DRAWN AEROBIC ONLY Blood Culture adequate volume   CULT  01/01/2024 1316    NO GROWTH 5 DAYS Performed at [facility], [address], [place], [place] [address]    CULT  01/01/2024 1316    NO GROWTH 5 DAYS Performed at [facility], [address] [place], [place] [address]    REPTSTATUS 01/06/2024 FINAL 01/01/2024 1316   REPTSTATUS 01/06/2024 FINAL 01/01/2024 1316    Cardiac Enzymes: No results for input(s): CKTOTAL, CKMB, CKMBINDEX, TROPONINI in the last 168 hours. CBG: No results for input(s): GLUCAP in the last 168 hours. Iron  Studies:  Recent Labs    01/20/24 1356  IRON  56  TIBC 167*  FERRITIN 1,493*   Lab Results  Component Value Date   INR 1.1 10/27/2023   INR 1.2 03/05/2020  INR 1.0 02/26/2020   Studies/Results: DG Chest Port 1 View Result Date: 01/21/2024 CLINICAL DATA:  Cough. EXAM: PORTABLE CHEST 1 VIEW COMPARISON:  12/30/2023 FINDINGS: The heart is enlarged. Mediastinal contours are stable. Ill-defined left lung base opacity with possible left pleural effusion. The previous nodular density in the left upper lung zone is not definitively seen on the current exam. No pulmonary edema. No pneumothorax. IMPRESSION: 1. Ill-defined left lung base opacity with possible left pleural effusion. This may represent pneumonia in the appropriate clinical setting. 2. Cardiomegaly. Electronically Signed   By: Andrea Gasman M.D.   On: 01/21/2024 13:44    Medications:   aspirin  EC  325 mg Oral Daily   benzonatate   200 mg Oral TID   chlorpheniramine-HYDROcodone   5 mL Oral Q12H   cholecalciferol   2,000 Units Oral Daily   docusate sodium   100 mg Oral BID   ferric citrate   420 mg Oral TID WC   guaiFENesin   600 mg Oral BID   metoprolol   succinate  25 mg Oral Daily   multivitamin  1 tablet Oral QHS   paricalcitol   4.5 mcg Intravenous Q T,Th,Sa-HD   sodium chloride  flush  3 mL Intravenous Q12H   sodium chloride  flush  3 mL Intravenous Q12H    Dialysis Orders: [facility]. EDW 54 kg. 3.5 hours. 2K/2.5 calcium . AVG flow rates: 400/600. Heparin : None. Meds: EPO 6200 units every treatment, Venofer, Zemplar  4.5 mcg every treatment. Binders: Auryxia  3 tabs 3 times daily AC.   Assessment/Plan:  ESRD  -outpatient HD orders: [facility].  EDW 54 kg.  3.5 hours.  2K/2.5 calcium .  AVG flow rates: 400/600.  Heparin : None.  Meds: EPO 6200 units every treatment, Venofer, Zemplar  4.5 mcg every treatment.  Binders: Auryxia  3 tabs 3 times daily AC.  Has been having low albumin  therefore needs protein supplementation -HD per TTS schedule, next HD 1/20   Closed femoral fracture -s/p surgery 1/16 -cont to work with PT, will need to do HD in recliner eventually   Volume/ hypertension  -UF as tolerated -Leaving under EDW here lately. May need to lower at discharge   Anemia of Chronic Kidney Disease -hgb 10.4 -iron  replete. R/S ESA once hgb <10 -Transfuse PRN for Hgb <7   Secondary Hyperparathyroidism/Hyperphosphatemia - check phos, auryxia  resumed   Dispo -patient wishes to be in CIR and prefers NOT going to a outpatient SNF  Barbara Piety, NP [facility] 01/22/2024,10:14 AM  LOS: 4 days    "

## 2024-01-23 DIAGNOSIS — J9611 Chronic respiratory failure with hypoxia: Secondary | ICD-10-CM | POA: Diagnosis not present

## 2024-01-23 DIAGNOSIS — I10 Essential (primary) hypertension: Secondary | ICD-10-CM | POA: Diagnosis not present

## 2024-01-23 DIAGNOSIS — S72002A Fracture of unspecified part of neck of left femur, initial encounter for closed fracture: Secondary | ICD-10-CM | POA: Diagnosis not present

## 2024-01-23 DIAGNOSIS — N186 End stage renal disease: Secondary | ICD-10-CM | POA: Diagnosis not present

## 2024-01-23 LAB — CBC
HCT: 28.5 % — ABNORMAL LOW (ref 36.0–46.0)
Hemoglobin: 9.2 g/dL — ABNORMAL LOW (ref 12.0–15.0)
MCH: 31.4 pg (ref 26.0–34.0)
MCHC: 32.3 g/dL (ref 30.0–36.0)
MCV: 97.3 fL (ref 80.0–100.0)
Platelets: 198 K/uL (ref 150–400)
RBC: 2.93 MIL/uL — ABNORMAL LOW (ref 3.87–5.11)
RDW: 15.4 % (ref 11.5–15.5)
WBC: 4.9 K/uL (ref 4.0–10.5)
nRBC: 0 % (ref 0.0–0.2)

## 2024-01-23 LAB — BASIC METABOLIC PANEL WITH GFR
Anion gap: 11 (ref 5–15)
BUN: 39 mg/dL — ABNORMAL HIGH (ref 8–23)
CO2: 28 mmol/L (ref 22–32)
Calcium: 8.3 mg/dL — ABNORMAL LOW (ref 8.9–10.3)
Chloride: 93 mmol/L — ABNORMAL LOW (ref 98–111)
Creatinine, Ser: 7.83 mg/dL — ABNORMAL HIGH (ref 0.44–1.00)
GFR, Estimated: 5 mL/min — ABNORMAL LOW
Glucose, Bld: 99 mg/dL (ref 70–99)
Potassium: 4.1 mmol/L (ref 3.5–5.1)
Sodium: 132 mmol/L — ABNORMAL LOW (ref 135–145)

## 2024-01-23 MED ORDER — LIDOCAINE-PRILOCAINE 2.5-2.5 % EX CREA: 1.0000 | TOPICAL_CREAM | CUTANEOUS | Status: DC | PRN

## 2024-01-23 MED ORDER — LIDOCAINE HCL (PF) 1 % IJ SOLN: 5.0000 mL | INTRAMUSCULAR | Status: DC | PRN

## 2024-01-23 MED ORDER — HEPARIN SODIUM (PORCINE) 1000 UNIT/ML DIALYSIS: 1000.0000 [IU] | INTRAMUSCULAR | Status: DC | PRN

## 2024-01-23 MED ORDER — PENTAFLUOROPROP-TETRAFLUOROETH EX AERO: 1.0000 | INHALATION_SPRAY | CUTANEOUS | Status: DC | PRN

## 2024-01-23 NOTE — Progress Notes (Signed)
 "          [facility]                                                                              [name], is a 69 y.o. female, DOB - 03-25-1955, FMW:[id number] Admit date - 01/18/2024    Outpatient Primary MD for the patient is Tammy Rasmussen T, PA-C  LOS - 5  days  Chief Complaint  Patient presents with   Hip Pain       Brief summary   Patient is a 69 year old female with ESRD on HD, TTS, chronic hypoxic respiratory failure on 2 L O2 at baseline, HFrEF 30 to 35%, CAD, HTN, anxiety, chronic dyspnea presented to ED with left leg pain and hip pain.  Patient reported that she was in the medical transport after she had finished dialysis that stopped short and she fell into the aisle.  She had immediate left hip pain, could not get up again after sitting down.  Presented to ED where x-rays showed a left hip fracture.  Orthopedic surgery consulted. At baseline, lives at home with her daughter and not use any assistive devices.  Awaiting CIR   Assessment & Plan       Closed left femoral fracture (HCC) -Orthopedics consulted, s/p Percutaneous fixation of left femoral neck on 1/16 (Dr Kendal) - Continue pain control, aspirin  for DVT prophylaxis - Per orthopedics, WBAT LLE, continue PT, 2 weeks follow-up with Dr. Kendal - h/h stable, 9.2, awaiting CIR      ESRD (end stage renal disease) (HCC) on hemodialysis, TTS - Nephrology consulted, patient had completed hemodialysis on 1/15 prior to the fall - Continue HD per schedule  Vitamin D  deficiency - Vitamin D  8.0, continue vitamin D  2000 units daily supplement  Essential hypertension chronic HFrEF - Continue Toprol -XL 25 mg daily    Chronic hypoxic respiratory failure - Currently stable, no wheezing, continue 2 L O2 via [place], at baseline   History of CAD -Continue Toprol -XL, aspirin .    Cough - , was admitted 12/30/2023 -01/02/2024 for pneumonia, completed the course of antibiotics - Chest x-ray showed  left-sided pneumonia with pleural effusion, likely residual from recent pneumonia. No fever or chills, respiratory symptoms except cough, no leukocytosis.  No need of antibiotics.  Estimated body mass index is 20.39 kg/m as calculated from the following:   Height as of this encounter: 5' 3 (1.6 m).   Weight as of this encounter: 52.2 kg.  Code Status: Full code DVT Prophylaxis:  SCDs Start: 01/19/24 1450 SCDs Start: 01/18/24 2341 Place TED hose Start: 01/18/24 2341   Level of Care: Level of care: Telemetry Family Communication: Updated patient Disposition Plan:      Remains inpatient appropriate: awaiting CIR    Procedures:    Consultants:   Orthopedics Nephrology  Antimicrobials:   Anti-infectives (From admission, onward)    Start     Dose/Rate Route Frequency Ordered Stop   01/19/24 1545  ceFAZolin  (ANCEF ) IVPB 2g/100 mL premix  Status:  Discontinued        2 g 200 mL/hr over 30 Minutes Intravenous Every 8 hours 01/19/24 1450 01/19/24 1453   01/19/24 1100  ceFAZolin  (ANCEF ) IVPB 1 g/50 mL premix        1 g 100 mL/hr over 30 Minutes Intravenous On call to O.R. 01/19/24 1048 01/19/24 1247   01/19/24 1047  ceFAZolin  (ANCEF ) 2-4 GM/100ML-% IVPB       Note to Pharmacy: Edsel Toni HERO: cabinet override      01/19/24 1047 01/19/24 2259          Medications  aspirin  EC  325 mg Oral Daily   benzonatate   200 mg Oral TID   chlorpheniramine-HYDROcodone   5 mL Oral Q12H   cholecalciferol   2,000 Units Oral Daily   docusate sodium   100 mg Oral BID   ferric citrate   420 mg Oral TID WC   guaiFENesin   600 mg Oral BID   metoprolol  succinate  25 mg Oral Daily   multivitamin  1 tablet Oral QHS   paricalcitol   4.5 mcg Intravenous Q T,Th,Sa-HD   sodium chloride  flush  3 mL Intravenous Q12H   sodium chloride  flush  3 mL Intravenous Q12H      Subjective:   Barbara Crawford was seen and examined today.  No acute complaints, awaiting CIR. No CP, SOB, N/V. Motivated to  start rehab  Objective:   Vitals:   01/22/24 1611 01/22/24 2052 01/23/24 0359 01/23/24 0722  BP: 129/65 (!) 119/57 103/79 129/67  Pulse: 91 86 82 88  Resp: 16  16 16   Temp:  98 F (36.7 C) 98.5 F (36.9 C) 98.2 F (36.8 C)  TempSrc:  Oral Oral Oral  SpO2: 100% 97% 100% 95%  Weight:      Height:        Intake/Output Summary (Last 24 hours) at 01/23/2024 1031 Last data filed at 01/23/2024 9177 Gross per 24 hour  Intake 480 ml  Output --  Net 480 ml      Wt Readings from Last 3 Encounters:  01/20/24 52.2 kg  01/02/24 54.1 kg  11/17/23 55.8 kg    Physical Exam General: Alert and oriented x 3, NAD Cardiovascular: S1 S2 clear, RRR.  Respiratory: CTAB, no wheezing Gastrointestinal: Soft, nontender, nondistended, NBS Ext: no pedal edema bilaterally Neuro: no new deficits Psych: Normal affect, pleasant        Data Reviewed:  I have personally reviewed following labs    CBC Lab Results  Component Value Date   WBC 4.9 01/23/2024   RBC 2.93 (L) 01/23/2024   HGB 9.2 (L) 01/23/2024   HCT 28.5 (L) 01/23/2024   MCV 97.3 01/23/2024   MCH 31.4 01/23/2024   PLT 198 01/23/2024   MCHC 32.3 01/23/2024   RDW 15.4 01/23/2024   LYMPHSABS 0.5 (L) 01/19/2024   MONOABS 0.7 01/19/2024   EOSABS 0.0 01/19/2024   BASOSABS 0.1 01/19/2024     Last metabolic panel Lab Results  Component Value Date   NA 132 (L) 01/23/2024   K 4.1 01/23/2024   CL 93 (L) 01/23/2024   CO2 28 01/23/2024   BUN 39 (H) 01/23/2024   CREATININE 7.83 (H) 01/23/2024   GLUCOSE 99 01/23/2024   GFRNONAA 5 (L) 01/23/2024   GFRAA >60 01/28/2016   CALCIUM  8.3 (L) 01/23/2024   PHOS 6.5 (H) 01/20/2024   PROT 8.0 01/19/2024   ALBUMIN  2.9 (L) 01/20/2024   LABGLOB 4.1 (H) 03/16/2020   AGRATIO 0.6 (L) 03/16/2020   BILITOT 0.6 01/19/2024   ALKPHOS 51 01/19/2024   AST 23 01/19/2024   ALT 8 01/19/2024   ANIONGAP 11 01/23/2024    CBG (last  3)  No results for input(s): GLUCAP in the last 72 hours.     Coagulation Profile: No results for input(s): INR, PROTIME in the last 168 hours.   Radiology Studies: I have personally reviewed the imaging studies  No results found.      Nydia Distance M.D. [facility] 01/23/2024, 10:31 AM  Available via Epic secure chat 7am-7pm After 7 pm, please refer to night coverage provider listed on amion.    "

## 2024-01-23 NOTE — Progress Notes (Signed)
" °   01/23/24 1805  Vitals  Temp 98 F (36.7 C)  Temp Source Oral  BP (!) 145/63  Pulse Rate 94  Resp (!) 21  Oxygen  Therapy  SpO2 100 %  O2 Device Nasal Cannula  Patient Activity (if Appropriate) In bed  Pulse Oximetry Type Continuous  Oximetry Probe Site Changed No  Post Treatment  Dialyzer Clearance Lightly streaked  Hemodialysis Intake (mL) 0 mL  Liters Processed 84  Fluid Removed (mL) 2000 mL  Tolerated HD Treatment Yes  AVG/AVF Arterial Site Held (minutes) 5 minutes  AVG/AVF Venous Site Held (minutes) 5 minutes  Fistula / Graft Left Upper arm Arteriovenous fistula  Placement Date/Time: 03/13/20 1101   Orientation: Left  Access Location: Upper arm  Access Type: Arteriovenous fistula  Site Condition No complications  Fistula / Graft Assessment Present;Thrill;Bruit  Status Deaccessed  Drainage Description None    "

## 2024-01-23 NOTE — Progress Notes (Signed)
 Inpatient Rehab Admissions Coordinator      CIR following. I called and left VM for her daughter to discuss d/c plan as she was hoping to be able to take time off work for [employer].  Pt. Does appear to be an appropriate candidate, but it is not clear If she will have support at d/c or if she can d/c to a location that lacks stairs, as she currently has to climb a flight of stairs 3x a week to get to her HD appts.   Leita Kleine, MS, CCC-SLP Rehab Admissions Coordinator  [phone] (celll) [phone] (office)
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
## 2024-01-23 NOTE — Progress Notes (Signed)
 " [facility] Progress Note   Subjective:    Seen and examined patient at bedside. Denies SOB, CP, and N/V. Plan for HD this afternoon. CIR is following and final decision is pending.  Objective Vitals:   01/22/24 2052 01/23/24 0359 01/23/24 0722 01/23/24 1411  BP: (!) 119/57 103/79 129/67 127/60  Pulse: 86 82 88 86  Resp:  16 16 20   Temp: 98 F (36.7 C) 98.5 F (36.9 C) 98.2 F (36.8 C) 98.5 F (36.9 C)  TempSrc: Oral Oral Oral   SpO2: 97% 100% 95% 100%  Weight:    54.2 kg  Height:       Physical Exam Gen: Awake, alert, NAD CVS: RRR Resp: normal wob, unlabored Jai:dnqu, nt/nd Ext: no sig edema Neuro: awake, alert Dialysis access: RUE AVG  Filed Weights   01/20/24 0907 01/20/24 1241 01/23/24 1411  Weight: 53.2 kg 52.2 kg 54.2 kg    Intake/Output Summary (Last 24 hours) at 01/23/2024 1420 Last data filed at 01/23/2024 9177 Gross per 24 hour  Intake 480 ml  Output --  Net 480 ml    Additional Objective Labs: Basic Metabolic Panel: Recent Labs  Lab 01/20/24 0453 01/20/24 1356 01/23/24 0418  NA 135 132* 132*  K 4.5 3.7 4.1  CL 97* 95* 93*  CO2 23 27 28   GLUCOSE 103* 70 99  BUN 28* 12 39*  CREATININE 5.95* 3.00* 7.83*  CALCIUM  8.7* 8.3* 8.3*  PHOS 6.5*  --   --    Liver Function Tests: Recent Labs  Lab 01/19/24 1515 01/20/24 0453  AST 23  --   ALT 8  --   ALKPHOS 51  --   BILITOT 0.6  --   PROT 8.0  --   ALBUMIN  3.1* 2.9*   No results for input(s): LIPASE, AMYLASE in the last 168 hours. CBC: Recent Labs  Lab 01/19/24 0110 01/19/24 0500 01/20/24 0730 01/20/24 1356 01/21/24 0502 01/22/24 0500 01/23/24 0418  WBC 5.9   < > 6.5 5.6 5.8 4.2 4.9  NEUTROABS 4.6  --   --   --   --   --   --   HGB 9.3*   < > 10.3* 10.7* 10.4* 9.3* 9.2*  HCT 29.9*   < > 32.2* 33.1* 32.9* 29.4* 28.5*  MCV 101.0*   < > 96.4 96.5 97.9 98.7 97.3  PLT 113*   < > 213 177 175 181 198   < > = values in this interval not displayed.   Blood Culture     Component Value Date/Time   SDES BLOOD RIGHT ARM 01/01/2024 1316   SDES BLOOD RIGHT ARM 01/01/2024 1316   SPECREQUEST  01/01/2024 1316    BOTTLES DRAWN AEROBIC ONLY Blood Culture adequate volume   SPECREQUEST  01/01/2024 1316    BOTTLES DRAWN AEROBIC ONLY Blood Culture adequate volume   CULT  01/01/2024 1316    NO GROWTH 5 DAYS Performed at [facility], [address] [place], [place] [address]    CULT  01/01/2024 1316    NO GROWTH 5 DAYS Performed at [facility], [address] [place], [place] [address]    REPTSTATUS 01/06/2024 FINAL 01/01/2024 1316   REPTSTATUS 01/06/2024 FINAL 01/01/2024 1316    Cardiac Enzymes: No results for input(s): CKTOTAL, CKMB, CKMBINDEX, TROPONINI in the last 168 hours. CBG: No results for input(s): GLUCAP in the last 168 hours. Iron  Studies: No results for input(s): IRON , TIBC, TRANSFERRIN, FERRITIN in the last 72  hours. Lab Results  Component Value Date   INR 1.1 10/27/2023   INR 1.2 03/05/2020   INR 1.0 02/26/2020   Studies/Results: No results found.  Medications:   aspirin  EC  325 mg Oral Daily   benzonatate   200 mg Oral TID   chlorpheniramine-HYDROcodone   5 mL Oral Q12H   cholecalciferol   2,000 Units Oral Daily   docusate sodium   100 mg Oral BID   ferric citrate   420 mg Oral TID WC   guaiFENesin   600 mg Oral BID   metoprolol  succinate  25 mg Oral Daily   multivitamin  1 tablet Oral QHS   paricalcitol   4.5 mcg Intravenous Q T,Th,Sa-HD   sodium chloride  flush  3 mL Intravenous Q12H   sodium chloride  flush  3 mL Intravenous Q12H    Dialysis Orders: [facility]. EDW 54 kg. 3.5 hours. 2K/2.5 calcium . AVG flow rates: 400/600. Heparin : None. Meds: EPO 6200 units every treatment, Venofer, Zemplar  4.5 mcg every treatment. Binders: Auryxia  3 tabs 3 times daily AC.   Assessment/Plan: ESRD  -outpatient HD orders: [facility].  EDW 54 kg.  3.5 hours.  2K/2.5 calcium .  AVG flow rates: 400/600.  Heparin :  None.  Meds: EPO 6200 units every treatment, Venofer, Zemplar  4.5 mcg every treatment.  Binders: Auryxia  3 tabs 3 times daily AC.  Has been having low albumin  therefore needs protein supplementation -HD per TTS schedule, next HD this afternoon. Plan to next HD in the chair.   Closed femoral fracture -s/p surgery 1/16 -cont to work with PT, discussed with PT plan for next HD in the recliner.    Volume/ hypertension  -UF as tolerated -Leaving under EDW here lately. May need to lower at discharge   Anemia of Chronic Kidney Disease -hgb 10.4 -iron  replete. R/S ESA once hgb <10 -Transfuse PRN for Hgb <7   Secondary Hyperparathyroidism/Hyperphosphatemia - check phos, auryxia  resumed   Dispo -patient wishes to be in CIR and prefers NOT going to a outpatient SNF -Final decision on CIR is pending  Charmaine Piety, NP [facility] 01/23/2024,2:20 PM  LOS: 5 days    "

## 2024-01-24 DIAGNOSIS — S728X2A Other fracture of left femur, initial encounter for closed fracture: Secondary | ICD-10-CM | POA: Diagnosis not present

## 2024-01-24 DIAGNOSIS — S72002A Fracture of unspecified part of neck of left femur, initial encounter for closed fracture: Secondary | ICD-10-CM | POA: Diagnosis not present

## 2024-01-24 LAB — CBC
HCT: 28.7 % — ABNORMAL LOW (ref 36.0–46.0)
Hemoglobin: 9.3 g/dL — ABNORMAL LOW (ref 12.0–15.0)
MCH: 31.2 pg (ref 26.0–34.0)
MCHC: 32.4 g/dL (ref 30.0–36.0)
MCV: 96.3 fL (ref 80.0–100.0)
Platelets: 169 K/uL (ref 150–400)
RBC: 2.98 MIL/uL — ABNORMAL LOW (ref 3.87–5.11)
RDW: 15.3 % (ref 11.5–15.5)
WBC: 4.6 K/uL (ref 4.0–10.5)
nRBC: 0 % (ref 0.0–0.2)

## 2024-01-24 LAB — BASIC METABOLIC PANEL WITH GFR
Anion gap: 5 (ref 5–15)
BUN: 19 mg/dL (ref 8–23)
CO2: 33 mmol/L — ABNORMAL HIGH (ref 22–32)
Calcium: 8.1 mg/dL — ABNORMAL LOW (ref 8.9–10.3)
Chloride: 94 mmol/L — ABNORMAL LOW (ref 98–111)
Creatinine, Ser: 4.39 mg/dL — ABNORMAL HIGH (ref 0.44–1.00)
GFR, Estimated: 10 mL/min — ABNORMAL LOW
Glucose, Bld: 88 mg/dL (ref 70–99)
Potassium: 3.9 mmol/L (ref 3.5–5.1)
Sodium: 131 mmol/L — ABNORMAL LOW (ref 135–145)
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
## 2024-01-24 NOTE — Progress Notes (Signed)
 Inpatient Rehab Admissions Coordinator:   I spoke with Pt.'s daughter and she confirms she can take FMLA to be with Pt. At d/c. I will send her case to insurance once updated OT note is in.   Leita Kleine, MS, CCC-SLP Rehab Admissions Coordinator  [phone] (celll) [phone] (office)
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
## 2024-01-24 NOTE — PMR Pre-admission (Signed)
 PMR Admission Coordinator Pre-Admission Assessment  Patient: Barbara Crawford is an 69 y.o., female MRN: [id number] DOB: 1955-12-24 Height: 5' 3 (160 cm) Weight: 50.4 kg  Insurance Information HMO: yes    PPO:      PCP:      IPA:      80/20:     OTHER:  PRIMARY: BCBS Medicare      Policy#: [id number]      Subscriber: Pt CM Name: Barbara Crawford     Phone#:  [phone]    Fax#:  [phone] Pre-Cert#: [id number]  I received auth for CIR from  appeals department  at [facility]  for admit 01/26/24 through 01/29/24.  Updates due to [facility] at fax listed above.     Employer:  Benefits:  Phone #:      Name:  Eff Date: 01/04/2024 - 01/02/2198 Deductible: does not have one OOP Max: $4,900 ($0 met)   CIR: $400 co-pay day for days 1-6, $0 copay for days 7-90 SNF: $0.00 Copayment per day for days 1-20; $218 Copayment per day for days 21-60; $0.00 copayment for days 61-100 Maximum of 100 days/benefit period Outpatient:  $15 copay/visit Home Health:  100% coverage DME: 80% coverage, 20% co-insurance Providers: in network  SECONDARY:       Policy#:      Phone#:    Artist:       Phone#:   The Best Boy for patients in Inpatient Rehabilitation Facilities with attached Privacy Act Statement-Health Care Records was provided and verbally reviewed with: Pt  Emergency Contact Information Contact Information     Name Relation Home Work Mobile   Bradley Daughter   [phone]   [name]   [phone]   [name] [phone]  [phone]      Other Contacts     Name Relation Home Work Mobile   Scott,James Jerona Blades   [phone]       Current Medical History  Patient Admitting Diagnosis: Femur fx History of Present Illness: Barbara Crawford is a 69 y.o. female with medical history significant of ESRD on dialysis TTS schedule, chronic hypoxic respiratory failure 2 L oxygen  at baseline, HFrEF 30 to 35%,  CAD, essential hypertension, generalized anxiety disorder and chronic dyspnea presented to [facility] emergency department  on 01/18/24 complaining of left-sided leg pain and hip pain after falling from vehicle returning from dialysis.At presentation to ED patient was  hemodynamically stable.  X-ray femur showed acute impacted subcapital left femoral neck fracture.X-ray pelvis showed  Impacted subcapital left femoral neck fracture.and degenerative changes of the right hip.Underwent percutaneous fixation left femoral neck 1/16 (Dr Kendal). She was seen by PT/OT and they recommended CIR to assist return to PLOF.         Patient's medical record from [facility]  has been reviewed by the rehabilitation admission coordinator and physician.  Past Medical History  Past Medical History:  Diagnosis Date   Anxiety    ESRD (end stage renal disease) on dialysis (HCC)    Heart failure with mildly reduced ejection fraction (HFmrEF) (HCC)    Hypertension     Has the patient had major surgery during 100 days prior to admission? Yes  Family History   family history includes Dementia in her mother; Diabetes in her sister; Hypertension in her son.  Current Medications Current Medications[1]  Patients Current Diet:  Diet Order  Diet Heart Room service appropriate? Yes; Fluid consistency: Thin  Diet effective now                   Precautions / Restrictions Precautions Precautions: Fall Restrictions Weight Bearing Restrictions Per Provider Order: Yes LLE Weight Bearing Per Provider Order: Weight bearing as tolerated   Has the patient had 2 or more falls or a fall with injury in the past year? Yes  Prior Activity Level Community (5-7x/wk): Pt. active in the community PTA  Prior Functional Level Self Care: Did the patient need help bathing, dressing, using the toilet or eating? Independent  Indoor Mobility: Did the patient need assistance  with walking from room to room (with or without device)? Independent  Stairs: Did the patient need assistance with internal or external stairs (with or without device)? Independent  Functional Cognition: Did the patient need help planning regular tasks such as shopping or remembering to take medications? Independent  Patient Information Are you of Hispanic, Latino/a,or Spanish origin?: A. No, not of Hispanic, Latino/a, or Spanish origin What is your race?: B. Black or African American Do you need or want an interpreter to communicate with a doctor or health care staff?: 0. No  Patient's Response To:  Health Literacy and Transportation Is the patient able to respond to health literacy and transportation needs?: Yes Health Literacy - How often do you need to have someone help you when you read instructions, pamphlets, or other written material from your doctor or pharmacy?: Never In the past 12 months, has lack of transportation kept you from medical appointments or from getting medications?: No In the past 12 months, has lack of transportation kept you from meetings, work, or from getting things needed for daily living?: No  Home Assistive Devices / Equipment Home Equipment: [employer] (2 wheels), [employer] - quad, BSC/3in1  Prior Device Use: Indicate devices/aids used by the patient prior to current illness, exacerbation or injury? None of the above  Current Functional Level Cognition  Orientation Level: Oriented X4    Extremity Assessment (includes Sensation/Coordination)  Upper Extremity Assessment: Defer to OT evaluation  Lower Extremity Assessment: LLE deficits/detail LLE Deficits / Details: L femur fx    ADLs  Overall ADL's : Needs assistance/impaired Eating/Feeding: Set up, Sitting Grooming: Set up, Sitting Upper Body Bathing: Minimal assistance, Sitting Lower Body Bathing: Maximal assistance Upper Body Dressing : Set up, Sitting Lower Body Dressing: Maximal  assistance Toilet Transfer: Minimal assistance Toileting- Clothing Manipulation and Hygiene: Minimal assistance    Mobility  Overal bed mobility: Needs Assistance Bed Mobility: Supine to Sit, Sit to Supine Supine to sit: Mod assist, HOB elevated Sit to supine: Min assist General bed mobility comments: Mod A for management of LLE; practiced with use of belt around L foot to incr pt independence with task    Transfers  Overall transfer level: Needs assistance Equipment used: Rolling walker (2 wheels) Transfers: Sit to/from Stand Sit to Stand: Min assist Bed to/from chair/wheelchair/BSC transfer type:: Step pivot Step pivot transfers: Min assist, +2 physical assistance, +2 safety/equipment General transfer comment: STS from lowest bed surface with RW, minA +1. cues for setup/sequencing. Able to perform step pivot into recliner w/ min cues for sequencing and RW management, reduced eccentric control but able to mitigate with UE support on armrest. additional STS from recliner with CGA and RW    Ambulation / Gait / Stairs / Wheelchair Mobility  Ambulation/Gait Ambulation/Gait assistance: Contact guard assist Gait Distance (Feet): 30 Feet Assistive device:  Rolling walker (2 wheels) Gait Pattern/deviations: Step-to pattern, Narrow base of support, Trunk flexed General Gait Details: cues for posture/pacing, RW management in close environment, inc time/effort.    Posture / Balance Dynamic Sitting Balance Sitting balance - Comments: received sitting EOB without UE support, working on lunch. no apparent instability. Balance Overall balance assessment: Needs assistance Sitting-balance support: No upper extremity supported, Feet supported Sitting balance-Leahy Scale: Good Sitting balance - Comments: received sitting EOB without UE support, working on lunch. no apparent instability. Standing balance support: No upper extremity supported, During functional activity Standing balance-Leahy Scale:  Good Standing balance comment: use of RW for safety/comfort, but is able to wash hands at sink without reliance on device. also able to reach to dry hands without instability/LOB. CGA provided for safety    Special considerations/life events  Dialysis: Hemodialysis Tuesday, Thursday, and Saturday   Previous Home Environment (from acute therapy documentation) Living Arrangements: Children  Lives With: Spouse Available Help at Discharge: Family, Available PRN/intermittently Type of Home: Apartment Home Layout: One level Home Access: Stairs to enter Entrance Stairs-Rails: Left Entrance Stairs-Number of Steps: flight Bathroom Shower/Tub: Health Visitor: Standard Home Care Services: No Additional Comments: Pulled from previous admission 3 weeks ago.  Discharge Living Setting Plans for Discharge Living Setting: Patient's home Type of Home at Discharge: House Discharge Home Layout: One level Discharge Home Access: Stairs to enter Entrance Stairs-Rails: None Entrance Stairs-Number of Steps: 15 Discharge Bathroom Shower/Tub: Walk-in shower Discharge Bathroom Toilet: Standard Discharge Bathroom Accessibility: Yes How Accessible: Accessible via walker, Accessible via wheelchair Does the patient have any problems obtaining your medications?: No  Social/Family/Support Systems Patient Roles: Other (Comment) Contact Information: [phone] Anticipated Caregiver: Daughter can take FMLA Caregiver Availability: 24/7 Discharge Plan Discussed with Primary Caregiver: Yes Is Caregiver In Agreement with Plan?: Yes Does Caregiver/Family have Issues with Lodging/Transportation while Pt is in Rehab?: No  Goals Patient/Family Goal for Rehab: PT/OT Supervision Expected length of stay: 12-14 days Pt/Family Agrees to Admission and willing to participate: Yes Program Orientation Provided & Reviewed with Pt/Caregiver Including Roles  & Responsibilities: Yes  Decrease burden of Care  through IP rehab admission: Not anticipated  Possible need for SNF placement upon discharge: not anticipated  Patient Condition: I have reviewed medical records from [facility], spoken with CM, and patient and daughter. I met with patient at the bedside for inpatient rehabilitation assessment.  Patient will benefit from ongoing PT and OT, can actively participate in 3 hours of therapy a day 5 days of the week, and can make measurable gains during the admission.  Patient will also benefit from the coordinated team approach during an Inpatient Acute Rehabilitation admission.  The patient will receive intensive therapy as well as Rehabilitation physician, nursing, social worker, and care management interventions.  Due to safety, skin/wound care, disease management, medication administration, pain management, and patient education the patient requires 24 hour a day rehabilitation nursing.  The patient is currently Min a To CGA with mobility and basic ADLs.  Discharge setting and therapy post discharge at home with home health is anticipated.  Patient has agreed to participate in the Acute Inpatient Rehabilitation Program and will admit today.  Preadmission Screen Completed By:  Leita KATHEE Kleine, 01/24/2024 1:18 PM ______________________________________________________________________   Discussed status with Dr. Cornelio on 01/30/24 at 900 and received approval for admission today.  Admission Coordinator:  Leita KATHEE Kleine, CCC-SLP, time 1256/Date 01/30/24   Assessment/Plan: Diagnosis: L femoral neck fx s/p perc fixation Does  the need for close, 24 hr/day Medical supervision in concert with the patient's rehab needs make it unreasonable for this patient to be served in a less intensive setting? Yes Co-Morbidities requiring supervision/potential complications: ESRD on T/H/S, chronic resp failure on home O2 2L by [place]; sCHF, HTN, anxiety,  Due to bowel management, safety, skin/wound care, disease  management, medication administration, pain management, and patient education, does the patient require 24 hr/day rehab nursing? Yes Does the patient require coordinated care of a physician, rehab nurse, PT, OT, and SLP to address physical and functional deficits in the context of the above medical diagnosis(es)? Yes Addressing deficits in the following areas: balance, endurance, locomotion, strength, transferring, bowel/bladder control, bathing, dressing, feeding, grooming, and toileting Can the patient actively participate in an intensive therapy program of at least 3 hrs of therapy 5 days a week? Yes The potential for patient to make measurable gains while on inpatient rehab is good Anticipated functional outcomes upon discharge from inpatient rehab: supervision PT, supervision OT, n/a SLP Estimated rehab length of stay to reach the above functional goals is: 12-14 days Anticipated discharge destination: Home 10. Overall Rehab/Functional Prognosis: good   MD Signature:      [1]  Current Facility-Administered Medications:    acetaminophen  (TYLENOL ) tablet 325-650 mg, 325-650 mg, Oral, Q6H PRN, Danton Lauraine LABOR, PA-C, 325 mg at 01/23/24 2035   aspirin  EC tablet 325 mg, 325 mg, Oral, Daily, Danton Lauraine LABOR, PA-C, 325 mg at 01/24/24 9058   benzonatate  (TESSALON ) capsule 200 mg, 200 mg, Oral, TID, Danton Lauraine LABOR, PA-C, 200 mg at 01/19/24 1508   chlorpheniramine-HYDROcodone  (TUSSIONEX) 10-8 MG/5ML suspension 5 mL, 5 mL, Oral, Q12H, Rai, Ripudeep K, MD   cholecalciferol  (VITAMIN D3) 25 MCG (1000 UNIT) tablet 2,000 Units, 2,000 Units, Oral, Daily, Danton Lauraine LABOR, PA-C, 2,000 Units at 01/24/24 9058   diphenhydrAMINE  (BENADRYL ) 12.5 MG/5ML elixir 12.5-25 mg, 12.5-25 mg, Oral, Q4H PRN, Danton Lauraine LABOR, PA-C   docusate sodium  (COLACE) capsule 100 mg, 100 mg, Oral, BID, Danton Lauraine LABOR, PA-C, 100 mg at 01/24/24 9057   ferric citrate  (AURYXIA ) tablet 210 mg, 210 mg, Oral, PRN, Danton Lauraine LABOR,  PA-C   ferric citrate  (AURYXIA ) tablet 420 mg, 420 mg, Oral, TID WC, McClung, Sarah A, PA-C   guaiFENesin  (MUCINEX ) 12 hr tablet 600 mg, 600 mg, Oral, BID, McClung, Sarah A, PA-C   HYDROcodone -acetaminophen  (NORCO/VICODIN) 5-325 MG per tablet 1-2 tablet, 1-2 tablet, Oral, Q6H PRN, Danton Lauraine LABOR, PA-C   HYDROmorphone  (DILAUDID ) injection 0.5 mg, 0.5 mg, Intravenous, Q3H PRN, McClung, Sarah A, PA-C   methocarbamol  (ROBAXIN ) injection 500 mg, 500 mg, Intravenous, Q6H PRN, McClung, Sarah A, PA-C   metoCLOPramide  (REGLAN ) tablet 5-10 mg, 5-10 mg, Oral, Q8H PRN **OR** metoCLOPramide  (REGLAN ) injection 5-10 mg, 5-10 mg, Intravenous, Q8H PRN, McClung, Sarah A, PA-C   metoprolol  succinate (TOPROL -XL) 24 hr tablet 25 mg, 25 mg, Oral, Daily, Danton Lauraine LABOR, PA-C, 25 mg at 01/24/24 0942   multivitamin (RENA-VIT) tablet 1 tablet, 1 tablet, Oral, QHS, Danton Lauraine LABOR, PA-C, 1 tablet at 01/23/24 2036   ondansetron  (ZOFRAN ) tablet 4 mg, 4 mg, Oral, Q6H PRN **OR** ondansetron  (ZOFRAN ) injection 4 mg, 4 mg, Intravenous, Q6H PRN, McClung, Sarah A, PA-C   paricalcitol  (ZEMPLAR ) injection 4.5 mcg, 4.5 mcg, Intravenous, Q T,Th,Sa-HD, Dennise Hoes, MD, 4.5 mcg at 01/20/24 1240   polyethylene glycol (MIRALAX  / GLYCOLAX ) packet 17 g, 17 g, Oral, Daily PRN, Danton Lauraine LABOR, PA-C, 17 g at 01/24/24 9782  sodium chloride  flush (NS) 0.9 % injection 3 mL, 3 mL, Intravenous, Q12H, McClung, Sarah A, PA-C, 3 mL at 01/24/24 0943   sodium chloride  flush (NS) 0.9 % injection 3 mL, 3 mL, Intravenous, Q12H, McClung, Sarah A, PA-C, 3 mL at 01/24/24 0943   sodium chloride  flush (NS) 0.9 % injection 3 mL, 3 mL, Intravenous, PRN, Danton Lauraine LABOR, PA-C

## 2024-01-25 DIAGNOSIS — S728X2A Other fracture of left femur, initial encounter for closed fracture: Secondary | ICD-10-CM | POA: Diagnosis not present

## 2024-01-25 MED ORDER — LIDOCAINE HCL (PF) 1 % IJ SOLN: 5.0000 mL | INTRAMUSCULAR | Status: DC | PRN

## 2024-01-25 MED ORDER — LIDOCAINE-PRILOCAINE 2.5-2.5 % EX CREA: 1.0000 | TOPICAL_CREAM | CUTANEOUS | Status: DC | PRN

## 2024-01-25 MED ORDER — HEPARIN SODIUM (PORCINE) 1000 UNIT/ML DIALYSIS: 1000.0000 [IU] | INTRAMUSCULAR | Status: DC | PRN

## 2024-01-25 MED ORDER — PENTAFLUOROPROP-TETRAFLUOROETH EX AERO: 1.0000 | INHALATION_SPRAY | CUTANEOUS | Status: DC | PRN

## 2024-01-25 NOTE — Plan of Care (Signed)
" °  Problem: Education: Goal: Knowledge of General Education information will improve Description: Including pain rating scale, medication(s)/side effects and non-pharmacologic comfort measures Outcome: Progressing   Problem: Health Behavior/Discharge Planning: Goal: Ability to manage health-related needs will improve Outcome: Progressing   Problem: Clinical Measurements: Goal: Ability to maintain clinical measurements within normal limits will improve Outcome: Progressing Goal: Will remain free from infection Outcome: Progressing Goal: Diagnostic test results will improve Outcome: Progressing Goal: Respiratory complications will improve Outcome: Progressing Goal: Cardiovascular complication will be avoided Outcome: Progressing   Problem: Activity: Goal: Risk for activity intolerance will decrease Outcome: Progressing   Problem: Nutrition: Goal: Adequate nutrition will be maintained Outcome: Progressing   Problem: Coping: Goal: Level of anxiety will decrease Outcome: Progressing   Problem: Elimination: Goal: Will not experience complications related to bowel motility Outcome: Progressing Goal: Will not experience complications related to urinary retention Outcome: Progressing   Problem: Pain Managment: Goal: General experience of comfort will improve and/or be controlled Outcome: Progressing   Problem: Safety: Goal: Ability to remain free from injury will improve Outcome: Progressing   Problem: Skin Integrity: Goal: Risk for impaired skin integrity will decrease Outcome: Progressing   Problem: Bowel/Gastric: Goal: Gastrointestinal status for postoperative course will improve Outcome: Progressing   Problem: Cardiac: Goal: Ability to maintain an adequate cardiac output Outcome: Progressing Goal: Will show no evidence of cardiac arrhythmias Outcome: Progressing   Problem: Nutritional: Goal: Will attain and maintain optimal nutritional status Outcome:  Progressing   Problem: Neurological: Goal: Will regain or maintain usual level of consciousness Outcome: Progressing   Problem: Clinical Measurements: Goal: Ability to maintain clinical measurements within normal limits Outcome: Progressing Goal: Postoperative complications will be avoided or minimized Outcome: Progressing   Problem: Respiratory: Goal: Will regain and/or maintain adequate ventilation Outcome: Progressing Goal: Respiratory status will improve Outcome: Progressing   Problem: Skin Integrity: Goal: Demonstrates signs of wound healing without infection Outcome: Progressing   "

## 2024-01-25 NOTE — Progress Notes (Addendum)
 " [facility] Progress Note   Subjective:    Seen and examined patient on HD in the recliner. Tolerating UFG 1L and BP is 109/58. She denies SOB, CP, and N/V.  Objective Vitals:   01/25/24 0911 01/25/24 0932 01/25/24 1002 01/25/24 1032  BP: (!) 122/53 117/60 120/64 (!) 119/56  Pulse: 80 78 83 80  Resp: (!) 22 19 (!) 23 (!) 21  Temp:      TempSrc:      SpO2: 100% 100% 100% 100%  Weight:      Height:       Physical Exam Gen: Awake, alert, NAD CVS: RRR Resp: normal wob, unlabored Jai:dnqu, nt/nd Ext: no sig edema Neuro: awake, alert Dialysis access: RUE AVG  Filed Weights   01/23/24 1411 01/23/24 1834 01/25/24 0848  Weight: 54.2 kg 50.4 kg 54.6 kg   No intake or output data in the 24 hours ending 01/25/24 1105  Additional Objective Labs: Basic Metabolic Panel: Recent Labs  Lab 01/20/24 0453 01/20/24 1356 01/23/24 0418 01/24/24 0459  NA 135 132* 132* 131*  K 4.5 3.7 4.1 3.9  CL 97* 95* 93* 94*  CO2 23 27 28  33*  GLUCOSE 103* 70 99 88  BUN 28* 12 39* 19  CREATININE 5.95* 3.00* 7.83* 4.39*  CALCIUM  8.7* 8.3* 8.3* 8.1*  PHOS 6.5*  --   --   --    Liver Function Tests: Recent Labs  Lab 01/19/24 1515 01/20/24 0453  AST 23  --   ALT 8  --   ALKPHOS 51  --   BILITOT 0.6  --   PROT 8.0  --   ALBUMIN  3.1* 2.9*   No results for input(s): LIPASE, AMYLASE in the last 168 hours. CBC: Recent Labs  Lab 01/19/24 0110 01/19/24 0500 01/20/24 1356 01/21/24 0502 01/22/24 0500 01/23/24 0418 01/24/24 0459  WBC 5.9   < > 5.6 5.8 4.2 4.9 4.6  NEUTROABS 4.6  --   --   --   --   --   --   HGB 9.3*   < > 10.7* 10.4* 9.3* 9.2* 9.3*  HCT 29.9*   < > 33.1* 32.9* 29.4* 28.5* 28.7*  MCV 101.0*   < > 96.5 97.9 98.7 97.3 96.3  PLT 113*   < > 177 175 181 198 169   < > = values in this interval not displayed.   Blood Culture    Component Value Date/Time   SDES BLOOD RIGHT ARM 01/01/2024 1316   SDES BLOOD RIGHT ARM 01/01/2024 1316   SPECREQUEST   01/01/2024 1316    BOTTLES DRAWN AEROBIC ONLY Blood Culture adequate volume   SPECREQUEST  01/01/2024 1316    BOTTLES DRAWN AEROBIC ONLY Blood Culture adequate volume   CULT  01/01/2024 1316    NO GROWTH 5 DAYS Performed at [facility], [address], [place], [place] [address]    CULT  01/01/2024 1316    NO GROWTH 5 DAYS Performed at [facility], [address] [place], [place] [address]    REPTSTATUS 01/06/2024 FINAL 01/01/2024 1316   REPTSTATUS 01/06/2024 FINAL 01/01/2024 1316    Cardiac Enzymes: No results for input(s): CKTOTAL, CKMB, CKMBINDEX, TROPONINI in the last 168 hours. CBG: No results for input(s): GLUCAP in the last 168 hours. Iron  Studies: No results for input(s): IRON , TIBC, TRANSFERRIN, FERRITIN in the last 72 hours. Lab Results  Component Value Date   INR 1.1 10/27/2023   INR 1.2 03/05/2020  INR 1.0 02/26/2020   Studies/Results: No results found.  Medications:   aspirin  EC  325 mg Oral Daily   benzonatate   200 mg Oral TID   chlorpheniramine-HYDROcodone   5 mL Oral Q12H   cholecalciferol   2,000 Units Oral Daily   docusate sodium   100 mg Oral BID   ferric citrate   420 mg Oral TID WC   guaiFENesin   600 mg Oral BID   metoprolol  succinate  25 mg Oral Daily   multivitamin  1 tablet Oral QHS   paricalcitol   4.5 mcg Intravenous Q T,Th,Sa-HD   sodium chloride  flush  3 mL Intravenous Q12H   sodium chloride  flush  3 mL Intravenous Q12H    Dialysis Orders: [facility]. EDW 54 kg. 3.5 hours. 2K/2.5 calcium . AVG flow rates: 400/600. Heparin : None. Meds: EPO 6200 units every treatment, Venofer, Zemplar  4.5 mcg every treatment. Binders: Auryxia  3 tabs 3 times daily AC.   Assessment/Plan:  ESRD  -outpatient HD orders: [facility].  EDW 54 kg.  3.5 hours.  2K/2.5 calcium .  AVG flow rates: 400/600.  Heparin : None.  Meds: EPO 6200 units every treatment, Venofer, Zemplar  4.5 mcg every treatment.  Binders: Auryxia  3 tabs 3 times  daily AC.  Has been having low albumin  therefore needs protein supplementation -HD per TTS schedule, on HD in the recliner.   Closed femoral fracture -s/p surgery 1/16 -cont to work with PT   Volume/ hypertension  -UF as tolerated -Leaving under EDW here lately. May need to lower at discharge   Anemia of Chronic Kidney Disease -hgb 9.3, noted allergy to Aranesp  -iron  replete -Transfuse PRN for Hgb <7   Secondary Hyperparathyroidism/Hyperphosphatemia - check phos, auryxia  resumed   Dispo -patient wishes to be in CIR and prefers NOT going to a outpatient SNF -Final decision on CIR is pending  Charmaine Piety, NP [facility] 01/25/2024,11:05 AM  LOS: 7 days    "

## 2024-01-25 NOTE — Plan of Care (Signed)
" °  Problem: Skin Integrity: Goal: Risk for impaired skin integrity will decrease Outcome: Progressing   Problem: Education: Goal: Knowledge of the prescribed therapeutic regimen will improve Outcome: Progressing   Problem: Bowel/Gastric: Goal: Gastrointestinal status for postoperative course will improve Outcome: Progressing   Problem: Cardiac: Goal: Will show no evidence of cardiac arrhythmias Outcome: Progressing   Problem: Nutritional: Goal: Will attain and maintain optimal nutritional status Outcome: Progressing   Problem: Neurological: Goal: Will regain or maintain usual level of consciousness Outcome: Progressing   Problem: Clinical Measurements: Goal: Postoperative complications will be avoided or minimized Outcome: Progressing   Problem: Skin Integrity: Goal: Demonstrates signs of wound healing without infection Outcome: Progressing   Problem: Urinary Elimination: Goal: Will remain free from infection Outcome: Progressing   "

## 2024-01-25 NOTE — Progress Notes (Signed)
 " PROGRESS NOTE Barbara Crawford  FMW:[id number] DOB: [birth year] DOA: 01/18/2024 PCP: Tammy Tari DASEN, PA-C  Brief Narrative/Hospital Course: Barbara Crawford is a 69 y.o. female with PMH of  ESRD on HD, TTS, chronic hypoxic respiratory failure on 2 L O2 at baseline, HFrEF 30 to 35%, CAD, HTN, anxiety, chronic dyspnea presented to ED with left leg pain and hip pain.  Patient reported that she was in the medical transport after she had finished dialysis that stopped short and she fell into the aisle.  She had immediate left hip pain, could not get up again after sitting down.  Presented to ED where x-rays showed a left hip fracture.Underwent percutaneous fixation left femoral neck 1/16 (Dr Kendal), hemoglobin stable, pain controlled.  At this time waiting for CIR placement  Subjective: Seen and examined today in dialysis No new complaints no nausea vomiting.  Overnight vital stable On nasal cannula oxygen  2 L  Assessment and plan:  Closed left femoral fracture: Underwent percutaneous fixation left femoral neck 1/16 (Dr Kendal), hemoglobin stable, pain controlled. Continue postop plan as per orthopedic with WBAT LLE, Aquacel dressing has been removed surgical site dry intact DVT prophylaxis with  Aspirin  per ortho. Awaiting for CIR approval.  ESRD on HD TTS Secondary hyperparathyroidism/hyperphosphatemia Anemia of chronic kidney disease: Nephro following for HD-getting HD in chair/recliner today. Continue binders-Auryxia  and monitor volume status hb and BP ALONG W/ calcium  Phos. Recent Labs    10/27/23 0038 12/30/23 0242 12/30/23 0257 12/30/23 0953 12/31/23 0307 01/01/24 0231 01/19/24 0110 01/19/24 1515 01/20/24 0453 01/20/24 1356 01/23/24 0418 01/24/24 0459  BUN 17 39* 38*  --  33* 52* 16 21 28* 12 39* 19  CREATININE 4.75* 8.69* 9.00* 9.18* 6.58* 8.18* 4.21* 4.99* 5.95* 3.00* 7.83* 4.39*  CO2 27 21*  --   --  24 22 29 24 23 27 28  33*  K 3.2* 3.5 3.3*  --  4.0 4.3  3.4* 3.8 4.5 3.7 4.1 3.9   Recent Labs    01/20/24 1356 01/21/24 0502 01/22/24 0500 01/23/24 0418 01/24/24 0459  HGB 10.7* 10.4* 9.3* 9.2* 9.3*  MCV 96.5 97.9 98.7 97.3 96.3  FERRITIN 1,493*  --   --   --   --   TIBC 167*  --   --   --   --   IRON  56  --   --   --   --      Vitamin D  deficiency Continue vitamin D  replacement    Essential hypertension Chronic HFrEF: Euvolemic, vital stable.Continue Toprol -XL 25 mg daily.  Adjust volume  W/ hemodialysis   Chronic hypoxic respiratory failure Currently stable, no wheezing, continue 2 L O2 via [place], at baseline   History of CAD Stable, continue Toprol -XL, aspirin .     Cough Recent pneumonia on 12/27: Patient was admitted 12/30/2023 -01/02/2024 for pneumonia, completed the course of antibiotics- cxr- left-sided pneumonia with pleural effusion, likely residual from recent pneumonia.  Currently asymptomatic.  Will need follow-up x-ray to document resolution in 3 weeks   Moderate malnutrition with Body mass index is 21.32 kg/m.: Will benefit with PCP follow-up, weight loss,healthy lifestyle and outpatient sleep eval if not done.  Mobility: PT Orders: Active PT Follow up Rec: Acute Inpatient Rehab (3hours/Day)01/24/2024 1200   DVT prophylaxis: SCDs Start: 01/19/24 1450 SCDs Start: 01/18/24 2341 Place TED hose Start: 01/18/24 2341 Code Status:   Code Status: Full Code Family Communication: plan of care discussed with patient at bedside. Patient status is: Remains  hospitalized because of severity of illness Level of care: Telemetry   Dispo: The patient is from: Lives with her daughter, independent at baseline            Anticipated disposition: CIR once available.  Medically stable Objective: Vitals last 24 hrs: Vitals:   01/25/24 0911 01/25/24 0932 01/25/24 1002 01/25/24 1032  BP: (!) 122/53 117/60 120/64 (!) 119/56  Pulse: 80 78 83 80  Resp: (!) 22 19 (!) 23 (!) 21  Temp:      TempSrc:      SpO2: 100% 100% 100% 100%   Weight:      Height:        Physical Examination: General exam: aaox3 HEENT:Oral mucosa moist, Ear/Nose WNL grossly Respiratory system: Bilaterally clear BS,no use of accessory muscle Cardiovascular system: S1 & S2 +, No JVD. Gastrointestinal system: Abdomen soft,NT,ND, BS+ Nervous System: Alert, awake, moving all extremities,and following commands. Extremities: extremities warm, leg edema neg so, surgical site dry  Skin: Warm, no rashes MSK: Normal muscle bulk,tone, power, frail-appearing.  Medications reviewed:  Scheduled Meds:  aspirin  EC  325 mg Oral Daily   benzonatate   200 mg Oral TID   chlorpheniramine-HYDROcodone   5 mL Oral Q12H   cholecalciferol   2,000 Units Oral Daily   docusate sodium   100 mg Oral BID   ferric citrate   420 mg Oral TID WC   guaiFENesin   600 mg Oral BID   metoprolol  succinate  25 mg Oral Daily   multivitamin  1 tablet Oral QHS   paricalcitol   4.5 mcg Intravenous Q T,Th,Sa-HD   sodium chloride  flush  3 mL Intravenous Q12H   sodium chloride  flush  3 mL Intravenous Q12H   Continuous Infusions: Diet: Diet Order             Diet Heart Room service appropriate? Yes; Fluid consistency: Thin  Diet effective now                    Unresulted Labs (From admission, onward)    None      Data Reviewed: I have personally reviewed following labs and imaging studies ( see epic result tab) CBC: Recent Labs  Lab 01/19/24 0110 01/19/24 0500 01/20/24 1356 01/21/24 0502 01/22/24 0500 01/23/24 0418 01/24/24 0459  WBC 5.9   < > 5.6 5.8 4.2 4.9 4.6  NEUTROABS 4.6  --   --   --   --   --   --   HGB 9.3*   < > 10.7* 10.4* 9.3* 9.2* 9.3*  HCT 29.9*   < > 33.1* 32.9* 29.4* 28.5* 28.7*  MCV 101.0*   < > 96.5 97.9 98.7 97.3 96.3  PLT 113*   < > 177 175 181 198 169   < > = values in this interval not displayed.   CMP: Recent Labs  Lab 01/19/24 1515 01/20/24 0453 01/20/24 1356 01/23/24 0418 01/24/24 0459  NA 135 135 132* 132* 131*  K 3.8 4.5  3.7 4.1 3.9  CL 97* 97* 95* 93* 94*  CO2 24 23 27 28  33*  GLUCOSE 79 103* 70 99 88  BUN 21 28* 12 39* 19  CREATININE 4.99* 5.95* 3.00* 7.83* 4.39*  CALCIUM  8.9 8.7* 8.3* 8.3* 8.1*  PHOS  --  6.5*  --   --   --    GFR: Estimated Creatinine Clearance: 10.3 mL/min (A) (by C-G formula based on SCr of 4.39 mg/dL (H)). Recent Labs  Lab 01/19/24 1515 01/20/24 0453  AST  23  --   ALT 8  --   ALKPHOS 51  --   BILITOT 0.6  --   PROT 8.0  --   ALBUMIN  3.1* 2.9*   No results for input(s): LIPASE, AMYLASE in the last 168 hours. No results for input(s): AMMONIA in the last 168 hours. Coagulation Profile: No results for input(s): INR, PROTIME in the last 168 hours. Antimicrobials/Microbiology: Anti-infectives (From admission, onward)    Start     Dose/Rate Route Frequency Ordered Stop   01/19/24 1545  ceFAZolin  (ANCEF ) IVPB 2g/100 mL premix  Status:  Discontinued        2 g 200 mL/hr over 30 Minutes Intravenous Every 8 hours 01/19/24 1450 01/19/24 1453   01/19/24 1100  ceFAZolin  (ANCEF ) IVPB 1 g/50 mL premix        1 g 100 mL/hr over 30 Minutes Intravenous On call to O.R. 01/19/24 1048 01/19/24 1247   01/19/24 1047  ceFAZolin  (ANCEF ) 2-4 GM/100ML-% IVPB       Note to Pharmacy: Edsel Toni HERO: cabinet override      01/19/24 1047 01/19/24 2259         Component Value Date/Time   SDES BLOOD RIGHT ARM 01/01/2024 1316   SDES BLOOD RIGHT ARM 01/01/2024 1316   SPECREQUEST  01/01/2024 1316    BOTTLES DRAWN AEROBIC ONLY Blood Culture adequate volume   SPECREQUEST  01/01/2024 1316    BOTTLES DRAWN AEROBIC ONLY Blood Culture adequate volume   CULT  01/01/2024 1316    NO GROWTH 5 DAYS Performed at [facility], [address], [place], [place] [address]    CULT  01/01/2024 1316    NO GROWTH 5 DAYS Performed at [facility], [address] [place], [place] [address]    REPTSTATUS 01/06/2024 FINAL 01/01/2024 1316   REPTSTATUS 01/06/2024 FINAL 01/01/2024 1316     Procedures: Procedures (LRB): FIXATION, FEMUR, NECK, PERCUTANEOUS, USING SCREW (Left)   Mennie LAMY, MD [facility] 01/25/2024, 11:07 AM   "

## 2024-01-26 ENCOUNTER — Ambulatory Visit

## 2024-01-26 DIAGNOSIS — S728X2A Other fracture of left femur, initial encounter for closed fracture: Secondary | ICD-10-CM | POA: Diagnosis not present

## 2024-01-26 LAB — BASIC METABOLIC PANEL WITH GFR
Anion gap: 8 (ref 5–15)
BUN: 20 mg/dL (ref 8–23)
CO2: 28 mmol/L (ref 22–32)
Calcium: 8.6 mg/dL — ABNORMAL LOW (ref 8.9–10.3)
Chloride: 94 mmol/L — ABNORMAL LOW (ref 98–111)
Creatinine, Ser: 4.28 mg/dL — ABNORMAL HIGH (ref 0.44–1.00)
GFR, Estimated: 11 mL/min — ABNORMAL LOW
Glucose, Bld: 87 mg/dL (ref 70–99)
Potassium: 5.3 mmol/L — ABNORMAL HIGH (ref 3.5–5.1)
Sodium: 130 mmol/L — ABNORMAL LOW (ref 135–145)

## 2024-01-26 LAB — CBC
HCT: 31 % — ABNORMAL LOW (ref 36.0–46.0)
Hemoglobin: 9.9 g/dL — ABNORMAL LOW (ref 12.0–15.0)
MCH: 31.1 pg (ref 26.0–34.0)
MCHC: 31.9 g/dL (ref 30.0–36.0)
MCV: 97.5 fL (ref 80.0–100.0)
Platelets: 218 K/uL (ref 150–400)
RBC: 3.18 MIL/uL — ABNORMAL LOW (ref 3.87–5.11)
RDW: 15.2 % (ref 11.5–15.5)
WBC: 5.4 K/uL (ref 4.0–10.5)
nRBC: 0 % (ref 0.0–0.2)

## 2024-01-26 MED ORDER — SODIUM ZIRCONIUM CYCLOSILICATE 10 G PO PACK
10.0000 g | PACK | Freq: Once | ORAL | Status: AC
  Administered 2024-01-26: 10 g via ORAL
  Filled 2024-01-26: qty 1

## 2024-01-26 NOTE — Progress Notes (Signed)
 " [facility] Progress Note   Subjective:   Seen in room. Had leg and arm pain overnight, so didn't sleep well. No CP/dyspnea.  Objective Vitals:   01/25/24 1300 01/25/24 2020 01/26/24 0457 01/26/24 0723  BP:  (!) 109/58 (!) 100/55 (!) 149/66  Pulse:  94 82 81  Resp: 18 16 16 16   Temp:  99.3 F (37.4 C) 97.7 F (36.5 C) 97.7 F (36.5 C)  TempSrc:  Oral Oral Oral  SpO2:  100% 100% 100%  Weight:      Height:       Physical Exam General: Well appearing, NAD. Room air Heart: RRR Lungs: CTA anteriorly Abdomen: soft Extremities: no LE edema Dialysis Access: LUE AVG +t/b  Additional Objective Labs: Basic Metabolic Panel: Recent Labs  Lab 01/20/24 0453 01/20/24 1356 01/23/24 0418 01/24/24 0459 01/26/24 0451  NA 135   < > 132* 131* 130*  K 4.5   < > 4.1 3.9 5.3*  CL 97*   < > 93* 94* 94*  CO2 23   < > 28 33* 28  GLUCOSE 103*   < > 99 88 87  BUN 28*   < > 39* 19 20  CREATININE 5.95*   < > 7.83* 4.39* 4.28*  CALCIUM  8.7*   < > 8.3* 8.1* 8.6*  PHOS 6.5*  --   --   --   --    < > = values in this interval not displayed.   Liver Function Tests: Recent Labs  Lab 01/19/24 1515 01/20/24 0453  AST 23  --   ALT 8  --   ALKPHOS 51  --   BILITOT 0.6  --   PROT 8.0  --   ALBUMIN  3.1* 2.9*   CBC: Recent Labs  Lab 01/21/24 0502 01/22/24 0500 01/23/24 0418 01/24/24 0459 01/26/24 0451  WBC 5.8 4.2 4.9 4.6 5.4  HGB 10.4* 9.3* 9.2* 9.3* 9.9*  HCT 32.9* 29.4* 28.5* 28.7* 31.0*  MCV 97.9 98.7 97.3 96.3 97.5  PLT 175 181 198 169 218   Medications:   aspirin  EC  325 mg Oral Daily   benzonatate   200 mg Oral TID   chlorpheniramine-HYDROcodone   5 mL Oral Q12H   cholecalciferol   2,000 Units Oral Daily   docusate sodium   100 mg Oral BID   ferric citrate   420 mg Oral TID WC   guaiFENesin   600 mg Oral BID   metoprolol  succinate  25 mg Oral Daily   multivitamin  1 tablet Oral QHS   paricalcitol   4.5 mcg Intravenous Q T,Th,Sa-HD   sodium chloride  flush  3  mL Intravenous Q12H   sodium chloride  flush  3 mL Intravenous Q12H   sodium zirconium cyclosilicate   10 g Oral Once    Dialysis Orders [facility] T[facility]. EDW 54 kg. 3.5 hours. 2K/2.5 calcium . AVG flow rates: 400/600. Heparin : None. Meds: EPO 6200 units every treatment, Venofer, Zemplar  4.5 mcg every treatment. Binders: Auryxia  3 tabs 3 times daily AC.    Assessment/Plan:   ESRD  - TTS schedule - Next HD tomorrow, 1/24   Closed femoral fracture - s/p surgery 1/16 - cont to work with PT   Volume/ hypertension  - UF as tolerated - Leaving under EDW here, will lower on discharge   Anemia of Chronic Kidney Disease - Hgb 9.9 - Allergy to Aranesp , transfuse prn   Secondary Hyperparathyroidism/Hyperphosphatemia - CorrCa ok, Phos high - need to resume home binders   Dispo - CIR decision pending.  Izetta Boehringer, PA-C 01/26/2024, 12:23 PM  [facility]    "

## 2024-01-26 NOTE — Plan of Care (Signed)
" °  Problem: Education: Goal: Knowledge of General Education information will improve Description: Including pain rating scale, medication(s)/side effects and non-pharmacologic comfort measures Outcome: Progressing   Problem: Health Behavior/Discharge Planning: Goal: Ability to manage health-related needs will improve Outcome: Progressing   Problem: Clinical Measurements: Goal: Ability to maintain clinical measurements within normal limits will improve Outcome: Progressing Goal: Will remain free from infection Outcome: Progressing Goal: Diagnostic test results will improve Outcome: Progressing Goal: Respiratory complications will improve Outcome: Progressing Goal: Cardiovascular complication will be avoided Outcome: Progressing   Problem: Activity: Goal: Risk for activity intolerance will decrease Outcome: Progressing   Problem: Nutrition: Goal: Adequate nutrition will be maintained Outcome: Progressing   Problem: Coping: Goal: Level of anxiety will decrease Outcome: Progressing   Problem: Elimination: Goal: Will not experience complications related to bowel motility Outcome: Progressing Goal: Will not experience complications related to urinary retention Outcome: Progressing   Problem: Pain Managment: Goal: General experience of comfort will improve and/or be controlled Outcome: Progressing   Problem: Safety: Goal: Ability to remain free from injury will improve Outcome: Progressing   Problem: Skin Integrity: Goal: Risk for impaired skin integrity will decrease Outcome: Progressing   Problem: Bowel/Gastric: Goal: Gastrointestinal status for postoperative course will improve Outcome: Progressing   Problem: Cardiac: Goal: Ability to maintain an adequate cardiac output Outcome: Progressing Goal: Will show no evidence of cardiac arrhythmias Outcome: Progressing   Problem: Nutritional: Goal: Will attain and maintain optimal nutritional status Outcome:  Progressing   Problem: Clinical Measurements: Goal: Ability to maintain clinical measurements within normal limits Outcome: Progressing Goal: Postoperative complications will be avoided or minimized Outcome: Progressing   Problem: Respiratory: Goal: Will regain and/or maintain adequate ventilation Outcome: Progressing Goal: Respiratory status will improve Outcome: Progressing   Problem: Urinary Elimination: Goal: Will remain free from infection Outcome: Progressing Goal: Ability to achieve and maintain adequate urine output Outcome: Progressing   "

## 2024-01-26 NOTE — TOC Progression Note (Signed)
 Transition of Care [facility]) - Progression Note    Patient Details  Name: Barbara Crawford MRN: [id number] Date of Birth: 1955/12/14  Transition of Care [facility]) CM/SW Contact  Rosalva Jon Bloch, RN Phone Number: 01/26/2024, 1:14 PM  Clinical Narrative:    Pt received denial for CIR. Pt states she's appealing denial. States ok to have SNF workup in place as a back up.   Inpatient CM team following and will assist with needs...  Expected Discharge Plan: Skilled Nursing Facility Barriers to Discharge: Continued Medical Work up               Expected Discharge Plan and Services       Living arrangements for the past 2 months: Apartment                                       Social Drivers of Health (SDOH) Interventions SDOH Screenings   Food Insecurity: No Food Insecurity (01/19/2024)  Housing: Low Risk (01/19/2024)  Transportation Needs: No Transportation Needs (01/19/2024)  Utilities: Not At Risk (01/19/2024)  Depression (PHQ2-9): Low Risk (03/03/2023)  Financial Resource Strain: Low Risk (04/04/2021)   Received from [facility]  Physical Activity: Unknown (04/04/2021)   Received from [facility] visits prior to 03/05/2022., [facility]  Social Connections: Moderately Isolated (01/19/2024)  Stress: No Stress Concern Present (04/04/2021)   Received from [facility] visits prior to 03/05/2022., [facility]  Tobacco Use: Low Risk (01/19/2024)  Recent Concern: Tobacco Use - Medium Risk (01/12/2024)   Received from [facility]    Readmission Risk Interventions     No data to display

## 2024-01-26 NOTE — Progress Notes (Signed)
 " PROGRESS NOTE Barbara Crawford  FMW:[id number] DOB: 19-Mar-1955 DOA: 01/18/2024 PCP: Tammy Tari DASEN, PA-C  Brief Narrative/Hospital Course: Barbara Crawford is a 69 y.o. female with PMH of  ESRD on HD, TTS, chronic hypoxic respiratory failure on 2 L O2 at baseline, HFrEF 30 to 35%, CAD, HTN, anxiety, chronic dyspnea presented to ED with left leg pain and hip pain.  Patient reported that she was in the medical transport after she had finished dialysis that stopped short and she fell into the aisle.  She had immediate left hip pain, could not get up again after sitting down.  Presented to ED where x-rays showed a left hip fracture.Underwent percutaneous fixation left femoral neck 1/16 (Dr Kendal), hemoglobin stable, pain controlled.  At this time waiting for CIR placement  Subjective: Seen and examined  On the bedside chair and resting comfortably Mild swelling on the surgical side no new issues Overnight on nasal cannula oxygen  2 L, vital stable Labs shows hyperkalemia 5.3 hemoglobin stable 9.9  Assessment and plan:  Closed left femoral fracture: S/P percutaneous fixation left femoral neck 1/16 (Dr Kendal) Continue postop plan as per orthopedic with WBAT LLE, Aquacel dressing has been removed surgical site dry intact DVT prophylaxis with  Aspirin  per ortho. Awaiting for CIR approval.  ESRD on HD TTS Secondary hyperparathyroidism/hyperphosphatemia Anemia of chronic kidney disease Mild hyperkalemia from ESRD: Nephro following for HD GOT HD in chair/recliner 1/22.  Has mild hyperkalemia will do Lokelma  x 1 if nephro ok- ms sent. Continue binders-Auryxia  and monitor volume status hb and BP ALONG W/ calcium  Phos. Recent Labs    12/30/23 0242 12/30/23 0257 12/30/23 0953 12/31/23 0307 01/01/24 0231 01/19/24 0110 01/19/24 1515 01/20/24 0453 01/20/24 1356 01/23/24 0418 01/24/24 0459 01/26/24 0451  BUN 39* 38*  --  33* 52* 16 21 28* 12 39* 19 20  CREATININE 8.69* 9.00*  9.18* 6.58* 8.18* 4.21* 4.99* 5.95* 3.00* 7.83* 4.39* 4.28*  CO2 21*  --   --  24 22 29 24 23 27 28  33* 28  K 3.5 3.3*  --  4.0 4.3 3.4* 3.8 4.5 3.7 4.1 3.9 5.3*   Recent Labs    01/20/24 1356 01/21/24 0502 01/22/24 0500 01/23/24 0418 01/24/24 0459 01/26/24 0451  HGB 10.7* 10.4* 9.3* 9.2* 9.3* 9.9*  MCV 96.5 97.9 98.7 97.3 96.3 97.5  FERRITIN 1,493*  --   --   --   --   --   TIBC 167*  --   --   --   --   --   IRON  56  --   --   --   --   --      Vitamin D  deficiency Continue vitamin D  replacement    Essential hypertension Chronic HFrEF: Euvolemic, vital stable.Continue Toprol -XL 25 mg daily.  Adjust volume  W/ hemodialysis   Chronic hypoxic respiratory failure Currently stable, no wheezing, continue 2 L O2 via [place], at baseline   History of CAD Stable, continue Toprol -XL, aspirin .     Cough Recent pneumonia on 12/27: Patient was admitted 12/30/2023 -01/02/2024 for pneumonia, completed the course of antibiotics- cxr- left-sided pneumonia with pleural effusion, likely residual from recent pneumonia.  Currently asymptomatic.  Will need follow-up x-ray to document resolution in 3 weeks   Moderate malnutrition with Body mass index is 20.35 kg/m.: Will benefit with PCP follow-up, weight loss,healthy lifestyle and outpatient sleep eval if not done.  Mobility: PT Orders: Active PT Follow up Rec: Acute Inpatient Rehab (  3hours/Day)01/24/2024 1200   DVT prophylaxis: SCDs Start: 01/19/24 1450 SCDs Start: 01/18/24 2341 Place TED hose Start: 01/18/24 2341 Code Status:   Code Status: Full Code Family Communication: plan of care discussed with patient at bedside. Patient status is: Remains hospitalized because of severity of illness Level of care: Telemetry   Dispo: The patient is from: Lives with her daughter, independent at baseline            Anticipated disposition: CIR once available.  Medically stable, waiting for approval Objective: Vitals last 24 hrs: Vitals:   01/25/24  1300 01/25/24 2020 01/26/24 0457 01/26/24 0723  BP:  (!) 109/58 (!) 100/55 (!) 149/66  Pulse:  94 82 81  Resp: 18 16 16 16   Temp:  99.3 F (37.4 C) 97.7 F (36.5 C) 97.7 F (36.5 C)  TempSrc:  Oral Oral Oral  SpO2:  100% 100% 100%  Weight:      Height:       Physical Examination: General exam: aaox3 HEENT:Oral mucosa moist, Ear/Nose WNL grossly Respiratory system: Bilaterally clear to auscultation Cardiovascular system: S1 & S2 +, No JVD. Gastrointestinal system: Abdomen soft,NT,ND, BS+ Nervous System: AAO, non focal Extremities: extremities warm, leg edema neg so, surgical site dry  Skin: Warm, no rashes MSK: Normal muscle bulk,tone, power, frail-appearing.  Medications reviewed:  Scheduled Meds:  aspirin  EC  325 mg Oral Daily   benzonatate   200 mg Oral TID   chlorpheniramine-HYDROcodone   5 mL Oral Q12H   cholecalciferol   2,000 Units Oral Daily   docusate sodium   100 mg Oral BID   ferric citrate   420 mg Oral TID WC   guaiFENesin   600 mg Oral BID   metoprolol  succinate  25 mg Oral Daily   multivitamin  1 tablet Oral QHS   paricalcitol   4.5 mcg Intravenous Q T,Th,Sa-HD   sodium chloride  flush  3 mL Intravenous Q12H   sodium chloride  flush  3 mL Intravenous Q12H   Continuous Infusions: Diet: Diet Order             Diet Heart Room service appropriate? Yes; Fluid consistency: Thin  Diet effective now                    Unresulted Labs (From admission, onward)    None      Data Reviewed: I have personally reviewed following labs and imaging studies ( see epic result tab) CBC: Recent Labs  Lab 01/21/24 0502 01/22/24 0500 01/23/24 0418 01/24/24 0459 01/26/24 0451  WBC 5.8 4.2 4.9 4.6 5.4  HGB 10.4* 9.3* 9.2* 9.3* 9.9*  HCT 32.9* 29.4* 28.5* 28.7* 31.0*  MCV 97.9 98.7 97.3 96.3 97.5  PLT 175 181 198 169 218   CMP: Recent Labs  Lab 01/20/24 0453 01/20/24 1356 01/23/24 0418 01/24/24 0459 01/26/24 0451  NA 135 132* 132* 131* 130*  K 4.5 3.7 4.1  3.9 5.3*  CL 97* 95* 93* 94* 94*  CO2 23 27 28  33* 28  GLUCOSE 103* 70 99 88 87  BUN 28* 12 39* 19 20  CREATININE 5.95* 3.00* 7.83* 4.39* 4.28*  CALCIUM  8.7* 8.3* 8.3* 8.1* 8.6*  PHOS 6.5*  --   --   --   --    GFR: Estimated Creatinine Clearance: 10.5 mL/min (A) (by C-G formula based on SCr of 4.28 mg/dL (H)). Recent Labs  Lab 01/19/24 1515 01/20/24 0453  AST 23  --   ALT 8  --   ALKPHOS 51  --  BILITOT 0.6  --   PROT 8.0  --   ALBUMIN  3.1* 2.9*   No results for input(s): LIPASE, AMYLASE in the last 168 hours. No results for input(s): AMMONIA in the last 168 hours. Coagulation Profile: No results for input(s): INR, PROTIME in the last 168 hours. Antimicrobials/Microbiology: Anti-infectives (From admission, onward)    Start     Dose/Rate Route Frequency Ordered Stop   01/19/24 1545  ceFAZolin  (ANCEF ) IVPB 2g/100 mL premix  Status:  Discontinued        2 g 200 mL/hr over 30 Minutes Intravenous Every 8 hours 01/19/24 1450 01/19/24 1453   01/19/24 1100  ceFAZolin  (ANCEF ) IVPB 1 g/50 mL premix        1 g 100 mL/hr over 30 Minutes Intravenous On call to O.R. 01/19/24 1048 01/19/24 1247   01/19/24 1047  ceFAZolin  (ANCEF ) 2-4 GM/100ML-% IVPB       Note to Pharmacy: Edsel Toni HERO: cabinet override      01/19/24 1047 01/19/24 2259         Component Value Date/Time   SDES BLOOD RIGHT ARM 01/01/2024 1316   SDES BLOOD RIGHT ARM 01/01/2024 1316   SPECREQUEST  01/01/2024 1316    BOTTLES DRAWN AEROBIC ONLY Blood Culture adequate volume   SPECREQUEST  01/01/2024 1316    BOTTLES DRAWN AEROBIC ONLY Blood Culture adequate volume   CULT  01/01/2024 1316    NO GROWTH 5 DAYS Performed at [facility], [address] [place], [place] [address]    CULT  01/01/2024 1316    NO GROWTH 5 DAYS Performed at [facility], [address], [place], [place] [address]    REPTSTATUS 01/06/2024 FINAL 01/01/2024 1316   REPTSTATUS 01/06/2024 FINAL 01/01/2024 1316     Procedures: Procedures (LRB): FIXATION, FEMUR, NECK, PERCUTANEOUS, USING SCREW (Left)  Mennie LAMY, MD [facility] 01/26/2024, 11:09 AM   "

## 2024-01-26 NOTE — [place] FL2 (Signed)
 " [place]  MEDICAID FL2 LEVEL OF CARE FORM     IDENTIFICATION  Patient Name: Barbara Crawford Birthdate: [birth year] Sex: female Admission Date (Current Location): 01/18/2024  [facility] and [place] Number:  Producer, Television/film/video and Address:         Provider Number: [id number]  Attending Physician Name and Address:  Christobal Guadalajara, MD  Relative Name and Phone Number:  Wyn Debby Abu  Daughter  Emergency Contact  [phone]    Current Level of Care: Hospital Recommended Level of Care: Skilled Nursing Facility Prior Approval Number:    Date Approved/Denied:   PASRR Number: pending  Discharge Plan: SNF    Current Diagnoses: Patient Active Problem List   Diagnosis Date Noted   Closed left femoral fracture (HCC) 01/18/2024   Chronic hypoxic respiratory failure (HCC) 01/18/2024   HFrEF (heart failure with reduced ejection fraction) (HCC) 01/18/2024   History of CAD (coronary artery disease) 01/18/2024   Generalized anxiety disorder 01/18/2024   ESRD (end stage renal disease) on dialysis (HCC) 12/30/2023   Pneumonia 12/12/2021   Acute systolic CHF (congestive heart failure) (HCC)    CHF (congestive heart failure) (HCC) 10/06/2021   ESRD (end stage renal disease) (HCC)    Acute hypoxemic respiratory failure (HCC)    Respiratory failure (HCC) 08/15/2020   Encounter for orogastric (OG) tube placement    FSGS (focal segmental glomerulosclerosis)    Pleural effusion 03/07/2020   ABLA (acute blood loss anemia) 03/06/2020   Sepsis (HCC) versus SIRS due to autoimmune process 03/05/2020   Fever    Glomerulonephritis    Perinephric hematoma 03/04/2020   Seizure (HCC) 03/04/2020   Multifocal pneumonia 03/04/2020   Acute renal failure 02/25/2020   Normocytic anemia 02/25/2020   Uremia 02/25/2020   Essential hypertension 02/25/2020   Acute bronchitis 08/16/2017   Tachycardia 08/16/2017   Stiffness of left shoulder joint 01/09/2017   Cough 02/08/2015    Orientation  RESPIRATION BLADDER Height & Weight     Self, Time, Situation, Place    Continent Weight: 52.1 kg Height:  5' 3 (160 cm)  BEHAVIORAL SYMPTOMS/MOOD NEUROLOGICAL BOWEL NUTRITION STATUS      Continent Diet (refer to d/c summary)  AMBULATORY STATUS COMMUNICATION OF NEEDS Skin   Extensive Assist Verbally Normal (S/P Percutaneous fixation of left femoral neck, 1/16)                       Personal Care Assistance Level of Assistance  Bathing, Feeding, Dressing Bathing Assistance: Maximum assistance Feeding assistance: Independent Dressing Assistance: Maximum assistance     Functional Limitations Info  Sight, Hearing, Speech Sight Info: Adequate Hearing Info: Adequate Speech Info: Adequate    SPECIAL CARE FACTORS FREQUENCY  PT (By licensed PT), OT (By licensed OT)     PT Frequency: 5x/week, evaluate and treat OT Frequency: 5x/week, evaluate and treat            Contractures Contractures Info: Not present    Additional Factors Info  Code Status, Allergies Code Status Info: Full Code Allergies Info: : Bee Venom, Shellfish Protein-containing Drug Products, Azithromycin , Amoxicillin, Darbepoetin Alfa , Egg Solids, Whole, Iodinated Contrast Media, Mushroom, Bidil  (Isosorb Dinitrate-hydralazine ), Chlorhexidine , Egg Protein-containing Drug Products, Erythromycin, Hydralazine , Iodine , Penicillins           Current Medications (01/26/2024):  This is the current hospital active medication list Current Facility-Administered Medications  Medication Dose Route Frequency Provider Last Rate Last Admin   acetaminophen  (TYLENOL ) tablet 325-650 mg  325-650  mg Oral Q6H PRN Danton Lauraine LABOR, PA-C   325 mg at 01/26/24 9940   aspirin  EC tablet 325 mg  325 mg Oral Daily Danton Lauraine LABOR, PA-C   325 mg at 01/26/24 9182   benzonatate  (TESSALON ) capsule 200 mg  200 mg Oral TID Danton Lauraine LABOR, PA-C   200 mg at 01/19/24 1508   chlorpheniramine-HYDROcodone  (TUSSIONEX) 10-8 MG/5ML suspension 5 mL   5 mL Oral Q12H Rai, Ripudeep K, MD       cholecalciferol  (VITAMIN D3) 25 MCG (1000 UNIT) tablet 2,000 Units  2,000 Units Oral Daily Danton Lauraine LABOR, PA-C   2,000 Units at 01/26/24 9182   diphenhydrAMINE  (BENADRYL ) 12.5 MG/5ML elixir 12.5-25 mg  12.5-25 mg Oral Q4H PRN Danton Lauraine LABOR, PA-C       docusate sodium  (COLACE) capsule 100 mg  100 mg Oral BID Danton Lauraine LABOR, PA-C   100 mg at 01/26/24 0816   ferric citrate  (AURYXIA ) tablet 210 mg  210 mg Oral PRN Danton Lauraine LABOR, PA-C       ferric citrate  (AURYXIA ) tablet 420 mg  420 mg Oral TID WC McClung, Sarah A, PA-C       guaiFENesin  (MUCINEX ) 12 hr tablet 600 mg  600 mg Oral BID Danton, Sarah A, PA-C       HYDROcodone -acetaminophen  (NORCO/VICODIN) 5-325 MG per tablet 1-2 tablet  1-2 tablet Oral Q6H PRN Danton, Sarah A, PA-C       HYDROmorphone  (DILAUDID ) injection 0.5 mg  0.5 mg Intravenous Q3H PRN Danton Lauraine LABOR, PA-C       methocarbamol  (ROBAXIN ) injection 500 mg  500 mg Intravenous Q6H PRN Danton Lauraine LABOR, PA-C       metoCLOPramide  (REGLAN ) tablet 5-10 mg  5-10 mg Oral Q8H PRN Danton, Sarah A, PA-C       Or   metoCLOPramide  (REGLAN ) injection 5-10 mg  5-10 mg Intravenous Q8H PRN Danton Lauraine LABOR, PA-C       metoprolol  succinate (TOPROL -XL) 24 hr tablet 25 mg  25 mg Oral Daily Danton Lauraine LABOR, PA-C   25 mg at 01/26/24 9182   multivitamin (RENA-VIT) tablet 1 tablet  1 tablet Oral QHS Danton Lauraine LABOR, PA-C   1 tablet at 01/25/24 2102   ondansetron  (ZOFRAN ) tablet 4 mg  4 mg Oral Q6H PRN Danton Lauraine LABOR, PA-C       Or   ondansetron  (ZOFRAN ) injection 4 mg  4 mg Intravenous Q6H PRN Danton Lauraine LABOR, PA-C       paricalcitol  (ZEMPLAR ) injection 4.5 mcg  4.5 mcg Intravenous Q T,Th,Sa-HD Singh, Vikas, MD   4.5 mcg at 01/20/24 1240   polyethylene glycol (MIRALAX  / GLYCOLAX ) packet 17 g  17 g Oral Daily PRN Danton Lauraine LABOR, PA-C   17 g at 01/24/24 0217   sodium chloride  flush (NS) 0.9 % injection 3 mL  3 mL Intravenous Q12H Danton Lauraine LABOR,  PA-C   3 mL at 01/26/24 0818   sodium chloride  flush (NS) 0.9 % injection 3 mL  3 mL Intravenous Q12H Danton Lauraine LABOR, PA-C   3 mL at 01/26/24 9181   sodium chloride  flush (NS) 0.9 % injection 3 mL  3 mL Intravenous PRN Danton Lauraine LABOR, PA-C         Discharge Medications: Please see discharge summary for a list of discharge medications.  Relevant Imaging Results:  Relevant Lab Results:   Additional Information SS# [id number];   Receives out-pt HD at [facility] on TTS 10:30 am  chair time.  Rosalva Jon Bloch, RN     "

## 2024-01-26 NOTE — Progress Notes (Signed)
 Inpatient Rehab Admissions Coordinator:    Pt.s insurance case for CIR was denied. She wishes to appeal and have TOC explore SNF options in case appeal is denied. CIR will send her appeal today.   Leita Kleine, MS, CCC-SLP Rehab Admissions Coordinator  [phone] (celll) [phone] (office)
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
## 2024-01-26 NOTE — Progress Notes (Addendum)
 Mobility Specialist: Progress Note   01/26/24 1300  Mobility  Activity Ambulated with assistance  Level of Assistance Contact guard assist, steadying assist  Assistive Device Front wheel walker  Distance Ambulated (ft) 70 ft  LLE Weight Bearing Per Provider Order WBAT  Activity Response Tolerated well  Mobility Referral Yes  Mobility visit 1 Mobility  Mobility Specialist Start Time (ACUTE ONLY) 1225  Mobility Specialist Stop Time (ACUTE ONLY) 1300  Mobility Specialist Time Calculation (min) (ACUTE ONLY) 35 min    Pt received in chair, agreeable to mobility session. O2 tank left in room but pt off [place] upon entry. SpO2 98% on RA. CGA for STS from chair and for ambulation. No physical assist needed. Slow paced but tolerated increased distance well. SpO2 93-96% RA during ambulation. Returned to room and stood at sink to brush teeth and wash face with SV. Returned to chair. Left in chair with all needs met, call bell in reach. Chair alarm on.  Ileana Lute Mobility Specialist Please contact via SecureChat or Rehab office at [phone]

## 2024-01-27 DIAGNOSIS — S728X2A Other fracture of left femur, initial encounter for closed fracture: Secondary | ICD-10-CM | POA: Diagnosis not present

## 2024-01-27 LAB — BASIC METABOLIC PANEL WITH GFR
Anion gap: 13 (ref 5–15)
BUN: 36 mg/dL — ABNORMAL HIGH (ref 8–23)
CO2: 22 mmol/L (ref 22–32)
Calcium: 9.1 mg/dL (ref 8.9–10.3)
Chloride: 92 mmol/L — ABNORMAL LOW (ref 98–111)
Creatinine, Ser: 6.27 mg/dL — ABNORMAL HIGH (ref 0.44–1.00)
GFR, Estimated: 7 mL/min — ABNORMAL LOW
Glucose, Bld: 82 mg/dL (ref 70–99)
Potassium: 5.6 mmol/L — ABNORMAL HIGH (ref 3.5–5.1)
Sodium: 127 mmol/L — ABNORMAL LOW (ref 135–145)

## 2024-01-27 MED ORDER — SODIUM ZIRCONIUM CYCLOSILICATE 10 G PO PACK: 10.0000 g | PACK | Freq: Once | ORAL | Status: DC

## 2024-01-27 NOTE — Progress Notes (Signed)
 Mobility Specialist Progress Note;   01/27/24 0921  Mobility  Activity Ambulated with assistance  Level of Assistance Contact guard assist, steadying assist  Assistive Device Front wheel walker  Distance Ambulated (ft) 100 ft  LLE Weight Bearing Per Provider Order WBAT  Activity Response Tolerated well  Mobility Referral Yes  Mobility visit 1 Mobility  Mobility Specialist Start Time (ACUTE ONLY) [id number]  Mobility Specialist Stop Time (ACUTE ONLY) 0942  Mobility Specialist Time Calculation (min) (ACUTE ONLY) 21 min   Answered pts call light, finished on BSC. Required MinG to stand and assist pt w/ pericare. Pt eager to ambulate in hallway. Required light MinG assistance throughout ambulation. Ambulated on RA, SPO2 97%> when checked. Utilized chair follow, pt took 1x seated rest break after ambulating 173ft and was rolled back to room. Pt able to brush teeth at sink once returned. Pt left in chair with all needs met, alarm on.   Lauraine Erm Mobility Specialist Please contact via SecureChat or [employer] [phone]
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
## 2024-01-27 NOTE — Plan of Care (Signed)
   Problem: Education: Goal: Knowledge of General Education information will improve Description: Including pain rating scale, medication(s)/side effects and non-pharmacologic comfort measures Outcome: Progressing   Problem: Clinical Measurements: Goal: Will remain free from infection Outcome: Progressing Goal: Diagnostic test results will improve Outcome: Progressing   Problem: Activity: Goal: Risk for activity intolerance will decrease Outcome: Progressing
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
## 2024-01-27 NOTE — Progress Notes (Signed)
" °   01/27/24 1823  Vitals  Temp 98.3 F (36.8 C)  Temp Source Oral  BP (!) 151/71  BP Location Right Arm  BP Method Automatic  Patient Position (if appropriate) Sitting  Pulse Rate 94  Pulse Rate Source Monitor  ECG Heart Rate 93  Resp (!) 22  Oxygen  Therapy  SpO2 100 %  O2 Device Nasal Cannula  O2 Flow Rate (L/min) 2 L/min  Pulse Oximetry Type Continuous  During Treatment Monitoring  Blood Flow Rate (mL/min) 349 mL/min  Arterial Pressure (mmHg) -186.86 mmHg  Venous Pressure (mmHg) 200.39 mmHg  TMP (mmHg) 7.07 mmHg  Ultrafiltration Rate (mL/min) 676 mL/min  Dialysate Flow Rate (mL/min) 299 ml/min  Duration of HD Treatment -hour(s) 3.47 hour(s)  Cumulative Fluid Removed (mL) per Treatment  1477.98  HD Safety Checks Performed Yes  Intra-Hemodialysis Comments Tolerated well;Tx completed  Fistula / Graft Left Upper arm Arteriovenous fistula  Placement Date/Time: 03/13/20 1101   Orientation: Left  Access Location: Upper arm  Access Type: Arteriovenous fistula  Site Condition No complications  Fistula / Graft Assessment Present;Thrill;Bruit  Status Deaccessed  Drainage Description None    "
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
## 2024-01-27 NOTE — Progress Notes (Signed)
 Mobility Specialist Progress Note;   01/27/24 0902  Mobility  Activity Pivoted/transferred to/from [facility]  Level of Assistance Minimal assist, patient does 75% or more  Assistive Device Front wheel walker  Distance Ambulated (ft) 3 ft  LLE Weight Bearing Per Provider Order WBAT  Activity Response Tolerated well  Mobility Referral Yes  Mobility visit 1 Mobility  Mobility Specialist Start Time (ACUTE ONLY) 0902  Mobility Specialist Stop Time (ACUTE ONLY) 0913  Mobility Specialist Time Calculation (min) (ACUTE ONLY) 11 min   Answered pts chair alarm going off, pt attempting to get to [facility] w/o assistance. Upon standing, pt became incontinent w/ BM requiring pericare and linen change. Transferred pt to [facility] to finish. Instructed to hit call bell once finished.   Lauraine Erm Mobility Specialist Please contact via SecureChat or [employer] [phone]

## 2024-01-28 DIAGNOSIS — S728X2A Other fracture of left femur, initial encounter for closed fracture: Secondary | ICD-10-CM | POA: Diagnosis not present

## 2024-01-28 MED ORDER — DIPHENHYDRAMINE-ZINC ACETATE 2-0.1 % EX CREA
1.0000 | TOPICAL_CREAM | Freq: Every day | CUTANEOUS | Status: DC | PRN
  Administered 2024-01-29: 1 via TOPICAL
  Filled 2024-01-28: qty 28

## 2024-01-28 NOTE — Plan of Care (Signed)

## 2024-01-28 NOTE — Plan of Care (Signed)
   Problem: Education: Goal: Knowledge of General Education information will improve Description Including pain rating scale, medication(s)/side effects and non-pharmacologic comfort measures Outcome: Progressing
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
## 2024-01-28 NOTE — NC FL2 (Signed)
 " [place]  MEDICAID FL2 LEVEL OF CARE FORM     IDENTIFICATION  Patient Name: Barbara Crawford Birthdate: [birth year] Sex: female Admission Date (Current Location): 01/18/2024  [facility] and [place] Number:  Pro[employer], Television/film/video and Address:         Provider Number: [id number]  Attending Physician Name and Address:  Christobal Guadalajara, MD  Relative Name and Phone Number:  Wyn Debby Abu  Daughter  Emergency Contact  [phone]    Current Level of Care: Hospital Recommended Level of Care: Skilled Nursing Facility Prior Approval Number:    Date Approved/Denied:   PASRR Number: [id number]  Discharge Plan: SNF    Current Diagnoses: Patient Active Problem List   Diagnosis Date Noted   Closed left femoral fracture (HCC) 01/18/2024   Chronic hypoxic respiratory failure (HCC) 01/18/2024   HFrEF (heart failure with reduced ejection fraction) (HCC) 01/18/2024   History of CAD (coronary artery disease) 01/18/2024   Generalized anxiety disorder 01/18/2024   ESRD (end stage renal disease) on dialysis (HCC) 12/30/2023   Pneumonia 12/12/2021   Acute systolic CHF (congestive heart failure) (HCC)    CHF (congestive heart failure) (HCC) 10/06/2021   ESRD (end stage renal disease) (HCC)    Acute hypoxemic respiratory failure (HCC)    Respiratory failure (HCC) 08/15/2020   Encounter for orogastric (OG) tube placement    FSGS (focal segmental glomerulosclerosis)    Pleural effusion 03/07/2020   ABLA (acute blood loss anemia) 03/06/2020   Sepsis (HCC) versus SIRS due to autoimmune process 03/05/2020   Fever    Glomerulonephritis    Perinephric hematoma 03/04/2020   Seizure (HCC) 03/04/2020   Multifocal pneumonia 03/04/2020   Acute renal failure 02/25/2020   Normocytic anemia 02/25/2020   Uremia 02/25/2020   Essential hypertension 02/25/2020   Acute bronchitis 08/16/2017   Tachycardia 08/16/2017   Stiffness of left shoulder joint 01/09/2017   Cough 02/08/2015    Orientation  RESPIRATION BLADDER Height & Weight     Self, Time, Situation, Place  Normal Continent Weight: 114 lb 13.8 oz (52.1 kg) Height:  5' 3 (160 cm)  BEHAVIORAL SYMPTOMS/MOOD NEUROLOGICAL BOWEL NUTRITION STATUS      Continent Diet (refer to d/c summary)  AMBULATORY STATUS COMMUNICATION OF NEEDS Skin   Extensive Assist Verbally Normal (S/P Percutaneous fixation of left femoral neck, 1/16)                       Personal Care Assistance Level of Assistance  Bathing, Feeding, Dressing Bathing Assistance: Maximum assistance Feeding assistance: Independent Dressing Assistance: Maximum assistance     Functional Limitations Info  Sight, Hearing, Speech Sight Info: Adequate Hearing Info: Adequate Speech Info: Adequate    SPECIAL CARE FACTORS FREQUENCY  PT (By licensed PT), OT (By licensed OT)     PT Frequency: 5x/week, evaluate and treat OT Frequency: 5x/week, evaluate and treat            Contractures Contractures Info: Not present    Additional Factors Info  Code Status, Allergies Code Status Info: Full Code Allergies Info: : Bee Venom, Shellfish Protein-containing Drug Products, Azithromycin , Amoxicillin, Darbepoetin Alfa , Egg Solids, Whole, Iodinated Contrast Media, Mushroom, Bidil  (Isosorb Dinitrate-hydralazine ), Chlorhexidine , Egg Protein-containing Drug Products, Erythromycin, Hydralazine , Iodine , Penicillins           Current Medications (01/28/2024):  This is the current hospital active medication list Current Facility-Administered Medications  Medication Dose Route Frequency Provider Last Rate Last Admin   acetaminophen  (TYLENOL ) tablet 325-650  mg  325-650 mg Oral Q6H PRN Danton Lauraine LABOR, PA-C   325 mg at 01/28/24 0008   aspirin  EC tablet 325 mg  325 mg Oral Daily Danton Lauraine LABOR, PA-C   325 mg at 01/28/24 9060   benzonatate  (TESSALON ) capsule 200 mg  200 mg Oral TID Danton Lauraine LABOR, PA-C   200 mg at 01/19/24 1508   chlorpheniramine-HYDROcodone  (TUSSIONEX) 10-8  MG/5ML suspension 5 mL  5 mL Oral Q12H Rai, Ripudeep K, MD       cholecalciferol  (VITAMIN D3) 25 MCG (1000 UNIT) tablet 2,000 Units  2,000 Units Oral Daily Danton Lauraine LABOR, PA-C   2,000 Units at 01/28/24 9060   diphenhydrAMINE  (BENADRYL ) 12.5 MG/5ML elixir 12.5-25 mg  12.5-25 mg Oral Q4H PRN Danton Lauraine LABOR, PA-C       diphenhydrAMINE -zinc  acetate (BENADRYL ) 2-0.1 % cream 1 Application  1 Application Topical Daily PRN Kc, Mennie, MD       docusate sodium  (COLACE) capsule 100 mg  100 mg Oral BID Danton Lauraine LABOR, PA-C   100 mg at 01/27/24 9055   ferric citrate  (AURYXIA ) tablet 210 mg  210 mg Oral PRN Danton Lauraine LABOR, PA-C       ferric citrate  (AURYXIA ) tablet 420 mg  420 mg Oral TID WC McClung, Sarah A, PA-C       guaiFENesin  (MUCINEX ) 12 hr tablet 600 mg  600 mg Oral BID McClung, Sarah A, PA-C       HYDROcodone -acetaminophen  (NORCO/VICODIN) 5-325 MG per tablet 1-2 tablet  1-2 tablet Oral Q6H PRN Danton Lauraine LABOR, PA-C       HYDROmorphone  (DILAUDID ) injection 0.5 mg  0.5 mg Intravenous Q3H PRN Danton Lauraine LABOR, PA-C       methocarbamol  (ROBAXIN ) injection 500 mg  500 mg Intravenous Q6H PRN Danton Lauraine LABOR, PA-C       metoCLOPramide  (REGLAN ) tablet 5-10 mg  5-10 mg Oral Q8H PRN Danton, Sarah A, PA-C       Or   metoCLOPramide  (REGLAN ) injection 5-10 mg  5-10 mg Intravenous Q8H PRN Danton Lauraine LABOR, PA-C       metoprolol  succinate (TOPROL -XL) 24 hr tablet 25 mg  25 mg Oral Daily Danton Lauraine LABOR, PA-C   25 mg at 01/28/24 9060   multivitamin (RENA-VIT) tablet 1 tablet  1 tablet Oral QHS Danton Lauraine LABOR, PA-C   1 tablet at 01/25/24 2102   ondansetron  (ZOFRAN ) tablet 4 mg  4 mg Oral Q6H PRN Danton Lauraine LABOR, PA-C       Or   ondansetron  (ZOFRAN ) injection 4 mg  4 mg Intravenous Q6H PRN Danton Lauraine LABOR, PA-C       paricalcitol  (ZEMPLAR ) injection 4.5 mcg  4.5 mcg Intravenous Q T,Th,Sa-HD Singh, Vikas, MD   4.5 mcg at 01/27/24 1615   polyethylene glycol (MIRALAX  / GLYCOLAX ) packet 17 g  17 g Oral  Daily PRN Danton Lauraine LABOR, PA-C   17 g at 01/24/24 0217   sodium chloride  flush (NS) 0.9 % injection 3 mL  3 mL Intravenous Q12H Danton Lauraine LABOR, PA-C   3 mL at 01/27/24 2350   sodium chloride  flush (NS) 0.9 % injection 3 mL  3 mL Intravenous Q12H Danton Lauraine LABOR, PA-C   3 mL at 01/28/24 9044   sodium chloride  flush (NS) 0.9 % injection 3 mL  3 mL Intravenous PRN Danton Lauraine LABOR, PA-C         Discharge Medications: Please see discharge summary for a list of discharge medications.  Relevant Imaging Results:  Relevant Lab Results:   Additional Information SS# [id number];   Receives out-pt HD at [facility] on TTS 10:30 am chair time.  Inocente GORMAN Kindle, LCSW     "

## 2024-01-29 DIAGNOSIS — S728X2A Other fracture of left femur, initial encounter for closed fracture: Secondary | ICD-10-CM | POA: Diagnosis not present
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
## 2024-01-29 NOTE — Plan of Care (Signed)

## 2024-01-29 NOTE — Progress Notes (Signed)
 Inpatient Rehab Admissions Coordinator:    CIR following. Continue to await appeal decision for CIR.   Leita Kleine, MS, CCC-SLP Rehab Admissions Coordinator  [phone] (celll) [phone] (office)
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
## 2024-01-30 ENCOUNTER — Inpatient Hospital Stay (HOSPITAL_COMMUNITY)
Admission: AD | Admit: 2024-01-30 | DRG: 945 | Disposition: A | Source: Intra-hospital | Attending: Physical Medicine & Rehabilitation | Admitting: Physical Medicine & Rehabilitation

## 2024-01-30 DIAGNOSIS — E876 Hypokalemia: Secondary | ICD-10-CM

## 2024-01-30 DIAGNOSIS — S72002A Fracture of unspecified part of neck of left femur, initial encounter for closed fracture: Secondary | ICD-10-CM | POA: Diagnosis not present

## 2024-01-30 DIAGNOSIS — I5021 Acute systolic (congestive) heart failure: Secondary | ICD-10-CM | POA: Diagnosis present

## 2024-01-30 DIAGNOSIS — J9611 Chronic respiratory failure with hypoxia: Secondary | ICD-10-CM | POA: Diagnosis present

## 2024-01-30 DIAGNOSIS — I132 Hypertensive heart and chronic kidney disease with heart failure and with stage 5 chronic kidney disease, or end stage renal disease: Secondary | ICD-10-CM | POA: Diagnosis present

## 2024-01-30 DIAGNOSIS — Z7982 Long term (current) use of aspirin: Secondary | ICD-10-CM

## 2024-01-30 DIAGNOSIS — Z8679 Personal history of other diseases of the circulatory system: Secondary | ICD-10-CM

## 2024-01-30 DIAGNOSIS — R195 Other fecal abnormalities: Secondary | ICD-10-CM

## 2024-01-30 DIAGNOSIS — Z992 Dependence on renal dialysis: Secondary | ICD-10-CM

## 2024-01-30 DIAGNOSIS — F411 Generalized anxiety disorder: Secondary | ICD-10-CM | POA: Diagnosis present

## 2024-01-30 DIAGNOSIS — N186 End stage renal disease: Secondary | ICD-10-CM | POA: Diagnosis present

## 2024-01-30 DIAGNOSIS — N19 Unspecified kidney failure: Secondary | ICD-10-CM | POA: Diagnosis present

## 2024-01-30 DIAGNOSIS — I10 Essential (primary) hypertension: Secondary | ICD-10-CM | POA: Diagnosis present

## 2024-01-30 DIAGNOSIS — D649 Anemia, unspecified: Secondary | ICD-10-CM | POA: Diagnosis present

## 2024-01-30 DIAGNOSIS — I5020 Unspecified systolic (congestive) heart failure: Secondary | ICD-10-CM | POA: Diagnosis present

## 2024-01-30 DIAGNOSIS — M79602 Pain in left arm: Secondary | ICD-10-CM

## 2024-01-30 DIAGNOSIS — S7292XA Unspecified fracture of left femur, initial encounter for closed fracture: Secondary | ICD-10-CM | POA: Diagnosis present

## 2024-01-30 DIAGNOSIS — R059 Cough, unspecified: Secondary | ICD-10-CM | POA: Diagnosis present

## 2024-01-30 LAB — RENAL FUNCTION PANEL
Albumin: 3.2 g/dL — ABNORMAL LOW (ref 3.5–5.0)
Anion gap: 11 (ref 5–15)
BUN: 45 mg/dL — ABNORMAL HIGH (ref 8–23)
CO2: 28 mmol/L (ref 22–32)
Calcium: 8.9 mg/dL (ref 8.9–10.3)
Chloride: 93 mmol/L — ABNORMAL LOW (ref 98–111)
Creatinine, Ser: 7.71 mg/dL — ABNORMAL HIGH (ref 0.44–1.00)
GFR, Estimated: 5 mL/min — ABNORMAL LOW
Glucose, Bld: 103 mg/dL — ABNORMAL HIGH (ref 70–99)
Phosphorus: 5.8 mg/dL — ABNORMAL HIGH (ref 2.5–4.6)
Potassium: 5.5 mmol/L — ABNORMAL HIGH (ref 3.5–5.1)
Sodium: 132 mmol/L — ABNORMAL LOW (ref 135–145)

## 2024-01-30 LAB — CBC
HCT: 29.8 % — ABNORMAL LOW (ref 36.0–46.0)
Hemoglobin: 9.4 g/dL — ABNORMAL LOW (ref 12.0–15.0)
MCH: 30.2 pg (ref 26.0–34.0)
MCHC: 31.5 g/dL (ref 30.0–36.0)
MCV: 95.8 fL (ref 80.0–100.0)
Platelets: 190 10*3/uL (ref 150–400)
RBC: 3.11 MIL/uL — ABNORMAL LOW (ref 3.87–5.11)
RDW: 15 % (ref 11.5–15.5)
WBC: 6.4 10*3/uL (ref 4.0–10.5)
nRBC: 0 % (ref 0.0–0.2)

## 2024-01-30 MED ORDER — GUAIFENESIN ER 600 MG PO TB12
600.0000 mg | ORAL_TABLET | Freq: Two times a day (BID) | ORAL | Status: DC
  Filled 2024-01-30 (×6): qty 1

## 2024-01-30 MED ORDER — FERRIC CITRATE 1 GM 210 MG(FE) PO TABS: 210.0000 mg | ORAL_TABLET | ORAL | Status: DC | PRN

## 2024-01-30 MED ORDER — ACETAMINOPHEN 325 MG PO TABS
325.0000 mg | ORAL_TABLET | Freq: Four times a day (QID) | ORAL | Status: AC | PRN
  Administered 2024-01-31: 650 mg via ORAL
  Administered 2024-02-02 – 2024-02-04 (×3): 325 mg via ORAL
  Administered 2024-02-05 – 2024-02-08 (×4): 650 mg via ORAL
  Filled 2024-01-30 (×2): qty 2
  Filled 2024-01-30: qty 1
  Filled 2024-01-30 (×2): qty 2
  Filled 2024-01-30: qty 1
  Filled 2024-01-30 (×2): qty 2

## 2024-01-30 MED ORDER — POLYETHYLENE GLYCOL 3350 17 G PO PACK: 17.0000 g | PACK | Freq: Every day | ORAL | Status: AC | PRN

## 2024-01-30 MED ORDER — ASPIRIN 325 MG PO TBEC
325.0000 mg | DELAYED_RELEASE_TABLET | Freq: Every day | ORAL | Status: DC
  Administered 2024-01-31 – 2024-02-02 (×3): 325 mg via ORAL
  Filled 2024-01-30 (×6): qty 1

## 2024-01-30 MED ORDER — DOCUSATE SODIUM 100 MG PO CAPS: 100.0000 mg | ORAL_CAPSULE | Freq: Two times a day (BID) | ORAL | Status: AC

## 2024-01-30 MED ORDER — PARICALCITOL 5 MCG/ML IV SOLN
4.5000 ug | INTRAVENOUS | Status: AC
  Administered 2024-02-06 – 2024-02-08 (×2): 4.5 ug via INTRAVENOUS
  Filled 2024-01-30 (×5): qty 0.9

## 2024-01-30 MED ORDER — HYDROCOD POLI-CHLORPHE POLI ER 10-8 MG/5ML PO SUER
5.0000 mL | Freq: Two times a day (BID) | ORAL | Status: DC
  Filled 2024-01-30 (×6): qty 5

## 2024-01-30 MED ORDER — VITAMIN D 25 MCG (1000 UNIT) PO TABS
2000.0000 [IU] | ORAL_TABLET | Freq: Every day | ORAL | Status: AC
  Administered 2024-01-31 – 2024-02-08 (×9): 2000 [IU] via ORAL
  Filled 2024-01-30 (×10): qty 2

## 2024-01-30 MED ORDER — METOPROLOL SUCCINATE ER 25 MG PO TB24
25.0000 mg | ORAL_TABLET | Freq: Every day | ORAL | Status: AC
  Administered 2024-01-31 – 2024-02-09 (×6): 25 mg via ORAL
  Filled 2024-01-30 (×10): qty 1

## 2024-01-30 MED ORDER — ZINC SULFATE 220 (50 ZN) MG PO CAPS
220.0000 mg | ORAL_CAPSULE | Freq: Every day | ORAL | Status: DC
  Administered 2024-01-30: 220 mg via ORAL
  Filled 2024-01-30 (×3): qty 1

## 2024-01-30 MED ORDER — FERRIC CITRATE 1 GM 210 MG(FE) PO TABS
420.0000 mg | ORAL_TABLET | Freq: Three times a day (TID) | ORAL | Status: DC
  Filled 2024-01-30 (×8): qty 2

## 2024-01-30 MED ORDER — RENA-VITE PO TABS
1.0000 | ORAL_TABLET | Freq: Every day | ORAL | Status: DC
  Administered 2024-01-30: 1 via ORAL
  Filled 2024-01-30 (×3): qty 1

## 2024-01-30 MED ORDER — DOCUSATE SODIUM 100 MG PO CAPS
100.0000 mg | ORAL_CAPSULE | Freq: Two times a day (BID) | ORAL | Status: AC
  Administered 2024-01-30 – 2024-02-09 (×17): 100 mg via ORAL
  Filled 2024-01-30 (×21): qty 1

## 2024-01-30 MED ORDER — DIPHENHYDRAMINE-ZINC ACETATE 2-0.1 % EX CREA
1.0000 | TOPICAL_CREAM | Freq: Every day | CUTANEOUS | Status: AC | PRN
  Administered 2024-02-05: 1 via TOPICAL
  Filled 2024-01-30: qty 28

## 2024-01-30 MED ORDER — METHOCARBAMOL 500 MG PO TABS: 500.0000 mg | ORAL_TABLET | Freq: Four times a day (QID) | ORAL | Status: AC | PRN

## 2024-01-30 MED ORDER — BENZONATATE 200 MG PO CAPS: 200.0000 mg | ORAL_CAPSULE | Freq: Three times a day (TID) | ORAL | Status: AC

## 2024-01-30 MED ORDER — BENZONATATE 100 MG PO CAPS
200.0000 mg | ORAL_CAPSULE | Freq: Three times a day (TID) | ORAL | Status: DC
  Filled 2024-01-30 (×6): qty 2

## 2024-01-30 MED ORDER — HYDROCODONE-ACETAMINOPHEN 5-325 MG PO TABS: 1.0000 | ORAL_TABLET | Freq: Four times a day (QID) | ORAL | Status: AC | PRN

## 2024-01-30 MED ORDER — ONDANSETRON HCL 4 MG/2ML IJ SOLN: 4.0000 mg | Freq: Four times a day (QID) | INTRAMUSCULAR | Status: AC | PRN

## 2024-01-30 MED ORDER — ONDANSETRON HCL 4 MG PO TABS: 4.0000 mg | ORAL_TABLET | Freq: Four times a day (QID) | ORAL | Status: AC | PRN

## 2024-01-30 NOTE — Progress Notes (Signed)
 Inpatient Rehab Admissions Coordinator:   I received insurance auth for CIR but do not have a bed for her today. I am hopeful to get her in in the next 1-2 days.  Leita Kleine, MS, CCC-SLP Rehab Admissions Coordinator  [phone] (celll) [phone] (office)
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
## 2024-01-30 NOTE — Progress Notes (Signed)
 Mobility Specialist Progress Note:    01/30/24 1020  Mobility  Activity Ambulated with assistance (In hallway)  Level of Assistance Contact guard assist, steadying assist (+ Chair follow)  Assistive Device Front wheel walker  Distance Ambulated (ft) 100 ft  LLE Weight Bearing Per Provider Order WBAT  Activity Response Tolerated well  Mobility Referral Yes  Mobility visit 1 Mobility  Mobility Specialist Start Time (ACUTE ONLY) 1005  Mobility Specialist Stop Time (ACUTE ONLY) 1020  Mobility Specialist Time Calculation (min) (ACUTE ONLY) 15 min   Received pt standing at [facility] w/ NT and agreeable to mobility. Pt required MinG and chair follow for safety. Pt c/o slight LLE pain, otherwise tolerated well. Pt returned to room without fault. Left pt in chair with alarm on. Personal belongings and call light within reach. All needs met.  Lavanda Pollack Mobility Specialist  Please contact via [employer] or  Rehab Office [phone]
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
## 2024-01-30 NOTE — Discharge Summary (Signed)
 Physician Discharge Summary  Barbara Crawford FMW:[id number] DOB: 05/30/1955 DOA: 01/18/2024  PCP: Tammy Tari DASEN, PA-C  Admit date: 01/18/2024 Discharge date: 01/30/2024 Recommendations for Outpatient Follow-up:  Follow up with PCP in 1 weeks-call for appointment Please obtain BMP/CBC in one week  Discharge Dispo: CIR Discharge Condition: Stable Code Status:   Code Status: Full Code Diet recommendation:  Diet Order             Diet Heart Room service appropriate? Yes; Fluid consistency: Thin  Diet effective now                    Brief/Interim Summary: Barbara Crawford is a 69 y.o. female with PMH of  ESRD on HD, TTS, chronic hypoxic respiratory failure on 2 L O2 at baseline, HFrEF 30 to 35%, CAD, HTN, anxiety, chronic dyspnea presented to ED with left leg pain and hip pain.  Patient reported that she was in the medical transport after she had finished dialysis that stopped short and she fell into the aisle.  She had immediate left hip pain, could not get up again after sitting down.  Presented to ED where x-rays showed a left hip fracture.Underwent percutaneous fixation left femoral neck 1/16 (Dr Kendal), hemoglobin stable, pain controlled.  At this time waiting for CIR placement and remains medically stable.  Subjective: Seen and examined  Patient had low-grade temperature 100.5 vitals stable, denies cough cold runny nose headache chest pain labs ordered this morning  Awaiting placement Informed by CIR they have a bed today  Discharge Diagnoses:  Closed left femoral fracture: S/P percutaneous fixation left femoral neck fracture 1/16 (Dr Kendal) Continue multimodal pain management, continue aspirin  325MG  daily for DVT prophylaxis per Ortho. dressing has been removed surgical site dry intact.Continue PT OT, WBAT LLE.  ESRD on HD TTS Secondary hyperparathyroidism/hyperphosphatemia Anemia of chronic kidney disease hb ~9-10g Mild hyperkalemia from ESRD: Nephro  following for HD  S/P HD again last night, had mild hyperkalemia.  Monitor electrolytes  Continue binders-Auryxia  and monitor volume status hb and LABS Recent Labs    12/31/23 0307 01/01/24 0231 01/19/24 0110 01/19/24 1515 01/20/24 0453 01/20/24 1356 01/23/24 0418 01/24/24 0459 01/26/24 0451 01/27/24 1152  BUN 33* 52* 16 21 28* 12 39* 19 20 36*  CREATININE 6.58* 8.18* 4.21* 4.99* 5.95* 3.00* 7.83* 4.39* 4.28* 6.27*  CO2 24 22 29 24 23 27 28  33* 28 22  K 4.0 4.3 3.4* 3.8 4.5 3.7 4.1 3.9 5.3* 5.6*   Low-grade temp: If recurs will check respiratory virus panel, check CBC BMP  Vitamin D  deficiency Continue vitamin D  replacement    Essential hypertension Chronic HFrEF: Euvolemic, BP stable.Continue Toprol -XL 25 mg daily.  Adjust volume  W/ hemodialysis   Chronic hypoxic respiratory failure Currently stable, no wheezing, continue 2 L O2 via [place], at baseline   History of CAD Stable, continue Toprol -XL, aspirin .     Cough Recent pneumonia on 12/27: Patient was admitted 12/30/2023 -01/02/2024 for pneumonia, completed the course of antibiotics- cxr- left-sided pneumonia with pleural effusion, likely residual from recent pneumonia.  Currently asymptomatic.follow-up x-ray to document resolution in 3 wk   Moderate malnutrition with Body mass index is 20.35 kg/m.: Will benefit with PCP follow-up, weight loss,healthy lifestyle and outpatient sleep eval if not done.  Mobility: PT Orders: Active PT Follow up Rec: Acute Inpatient Rehab (3hours/Day)01/29/2024 1500   DVT prophylaxis: SCDs Start: 01/19/24 1450 SCDs Start: 01/18/24 2341 Place TED hose Start: 01/18/24 2341 Code Status:  Code Status: Full Code Family Communication: plan of care discussed with patient at bedside. Patient status is: Remains hospitalized because of severity of illness Level of care: Med-Surg   Dispo: The patient is from: Lives with her daughter, independent at baseline            Anticipated disposition:  she says CIR  was denied> appeal and at this time insurance has approved.  Waiting for CIR.  Objective: Vitals last 24 hrs: Vitals:   01/29/24 1636 01/29/24 2048 01/30/24 0432 01/30/24 0811  BP: 130/68 107/62 130/69 132/68  Pulse: 97 89 93 96  Resp: 18 18 18 16   Temp: (!) 100.5 F (38.1 C) 98.1 F (36.7 C) 98 F (36.7 C) 98.3 F (36.8 C)  TempSrc: Oral Oral Oral Oral  SpO2: 98% 100% 98% 98%  Weight:      Height:       Physical Examination: General exam: AAOX3, pleasant not in distress HEENT:Oral mucosa moist, Ear/Nose WNL grossly Respiratory system: Bilaterally diminished at base, no wheezing or crackles  Cardiovascular system: S1 & S2 +, No JVD. Gastrointestinal system: Abdomen soft,NT,ND, BS+ Nervous System: AAO, non focal Extremities: extremities warm, leg edema neg so, surgical site dry  Skin: Warm, no rashes MSK: Normal muscle bulk,tone, power, frail-appearing.  Medications reviewed:  Scheduled Meds:  aspirin  EC  325 mg Oral Daily   benzonatate   200 mg Oral TID   chlorpheniramine-HYDROcodone   5 mL Oral Q12H   cholecalciferol   2,000 Units Oral Daily   docusate sodium   100 mg Oral BID   ferric citrate   420 mg Oral TID WC   guaiFENesin   600 mg Oral BID   metoprolol  succinate  25 mg Oral Daily   multivitamin  1 tablet Oral QHS   paricalcitol   4.5 mcg Intravenous Q T,Th,Sa-HD   sodium chloride  flush  3 mL Intravenous Q12H   sodium chloride  flush  3 mL Intravenous Q12H   Continuous Infusions: Diet: Diet Order             Diet Heart Room service appropriate? Yes; Fluid consistency: Thin  Diet effective now                   Procedures (LRB): FIXATION, FEMUR, NECK, PERCUTANEOUS, USING SCREW (Left)  Consultation: See note.  Discharge Instructions  Discharge Instructions     Discharge instructions   Complete by: As directed    Please call call MD or return to ER for similar or worsening recurring problem that brought you to hospital or if any  fever,nausea/vomiting,abdominal pain, uncontrolled pain, chest pain,  shortness of breath or any other alarming symptoms.  Please follow-up your doctor as instructed in a week time and call the office for appointment.  Please avoid alcohol, smoking, or any other illicit substance and maintain healthy habits including taking your regular medications as prescribed.  You were cared for by a hospitalist during your hospital stay. If you have any questions about your discharge medications or the care you received while you were in the hospital after you are discharged, you can call the unit and ask to speak with the hospitalist on call if the hospitalist that took care of you is not available.  Once you are discharged, your primary care physician will handle any further medical issues. Please note that NO REFILLS for any discharge medications will be authorized once you are discharged, as it is imperative that you return to your primary care physician (or establish a relationship with a  primary care physician if you do not have one) for your aftercare needs so that they can reassess your need for medications and monitor your lab values   Increase activity slowly   Complete by: As directed    No wound care   Complete by: As directed       Allergies as of 01/30/2024       Reactions   Bee Venom Anaphylaxis   Shellfish Protein-containing Drug Products Anaphylaxis, Swelling, Other (See Comments)   Sweat and Dizzy. Lobster    Azithromycin  Hives   hives   Amoxicillin    Shortness of breath    Darbepoetin Alfa  Other (See Comments)   Unknown reaction   Egg Solids, Whole    Other Reaction(s): GI Intolerance   Iodinated Contrast Media    Other Reaction(s): Unknown   Mushroom Hives   Bidil  [isosorb Dinitrate-hydralazine ]    headache   Chlorhexidine  Rash   Egg Protein-containing Drug Products Nausea Only   Erythromycin Nausea Only, Other (See Comments)   Cramps    Hydralazine     headache    Iodine  Rash, Other (See Comments)   Arm flares up per pt   Penicillins Rash   Has patient had a PCN reaction causing immediate rash, facial/tongue/throat swelling, SOB or lightheadedness with hypotension: no Has patient had a PCN reaction causing severe rash involving mucus membranes or skin necrosis: yes Has patient had a PCN reaction that required hospitalization: no Has patient had a PCN reaction occurring within the last 10 years: no If all of the above answers are NO, then may proceed with Cephalosporin use. Other Reaction(s): GI Intolerance        Medication List     STOP taking these medications    acetaminophen  500 MG tablet Commonly known as: TYLENOL    LORazepam  0.5 MG tablet Commonly known as: ATIVAN        TAKE these medications    aspirin  EC 325 MG tablet Take 1 tablet (325 mg total) by mouth daily. What changed:  medication strength how much to take   benzonatate  200 MG capsule Commonly known as: TESSALON  Take 1 capsule (200 mg total) by mouth 3 (three) times daily.   diphenhydrAMINE  2 % cream Commonly known as: BENADRYL  Apply 1 Application topically 2 (two) times daily as needed for itching.   docusate sodium  100 MG capsule Commonly known as: COLACE Take 1 capsule (100 mg total) by mouth 2 (two) times daily.   ferric citrate  1 GM 210 MG(Fe) tablet Commonly known as: AURYXIA  Take 210-420 mg by mouth See admin instructions. Take 420 mg  by mouth with meals and 210 mg with snacks   HYDROcodone -acetaminophen  5-325 MG tablet Commonly known as: NORCO/VICODIN Take 1 tablet by mouth every 6 (six) hours as needed for severe pain (pain score 7-10).   metoprolol  succinate 25 MG 24 hr tablet Commonly known as: TOPROL -XL Take 1 tablet (25 mg total) by mouth daily.   multivitamin Tabs tablet Take 1 tablet by mouth at bedtime.   OXYGEN  Inhale 2 L/min into the lungs continuous.   polyethylene glycol 17 g packet Commonly known as: MIRALAX  /  GLYCOLAX  Take 17 g by mouth daily as needed for mild constipation.   vitamin D3 25 MCG tablet Commonly known as: CHOLECALCIFEROL  Take 2 tablets (2,000 Units total) by mouth daily.        Follow-up Information     Haddix, Franky SQUIBB, MD. Schedule an appointment as soon as possible for a visit in 2 week(s).  Specialty: Orthopedic Surgery Why: for wound check and repeat x-rays Contact information: [address] [place] [address] [phone]                Allergies[1]  The results of significant diagnostics from this hospitalization (including imaging, microbiology, ancillary and laboratory) are listed below for reference.    Microbiology: No results found for this or any previous visit (from the past 240 hours).  Procedures/Studies: DG Chest Port 1 View Result Date: 01/21/2024 CLINICAL DATA:  Cough. EXAM: PORTABLE CHEST 1 VIEW COMPARISON:  12/30/2023 FINDINGS: The heart is enlarged. Mediastinal contours are stable. Ill-defined left lung base opacity with possible left pleural effusion. The previous nodular density in the left upper lung zone is not definitively seen on the current exam. No pulmonary edema. No pneumothorax. IMPRESSION: 1. Ill-defined left lung base opacity with possible left pleural effusion. This may represent pneumonia in the appropriate clinical setting. 2. Cardiomegaly. Electronically Signed   By: Andrea Gasman M.D.   On: 01/21/2024 13:44   DG HIP PORT UNILAT W OR W/O PELVIS 1V LEFT Result Date: 01/19/2024 CLINICAL DATA:  Fracture, postop. EXAM: DG HIP (WITH OR WITHOUT PELVIS) 1V PORT LEFT COMPARISON:  Preoperative imaging FINDINGS: Three screws traverse left femoral neck fracture. Unchanged fracture alignment. Recent postsurgical change includes air and edema in the soft tissues. Degenerative change of the right hip. Probable calcified uterine fibroids. IMPRESSION: Pinning of left femoral neck fracture. No immediate postoperative complication.  Electronically Signed   By: Andrea Gasman M.D.   On: 01/19/2024 17:18   DG HIP UNILAT WITH PELVIS 2-3 VIEWS LEFT Result Date: 01/19/2024 CLINICAL DATA:  Elective surgery EXAM: DG HIP (WITH OR WITHOUT PELVIS) 2-3V LEFT COMPARISON:  Preoperative imaging FINDINGS: Six fluoroscopic spot views of the pelvis and left hip obtained in the operating room. Sequential images during hip pinning. Fluoroscopy time 1 minute 14 seconds. Dose 6.65 mGy. IMPRESSION: Intraoperative fluoroscopy during left hip pinning. Electronically Signed   By: Andrea Gasman M.D.   On: 01/19/2024 17:17   DG C-Arm 1-60 Min-No Report Result Date: 01/19/2024 Fluoroscopy was utilized by the requesting physician.  No radiographic interpretation.   CT Hip Left Wo Contrast Result Date: 01/18/2024 EXAM: CT OF THE LEFT HIP WITHOUT IV CONTRAST 01/18/2024 11:36:46 PM TECHNIQUE: CT of the left hip was performed without the administration of intravenous contrast. Multiplanar reformatted images are provided for review. Automated exposure control, iterative reconstruction, and/or weight based adjustment of the mA/kV was utilized to reduce the radiation dose to as low as reasonably achievable. COMPARISON: X-ray left hip 01/18/2024 CLINICAL HISTORY: Hip surgical planning FINDINGS: BONES: Acute nondisplaced left subcapital femoral neck fracture. No hip dislocation. No acute fractures of the partially visualized left pelvic bones. No aggressive appearing osseous abnormality or periostitis. SOFT TISSUE: Lipohemarthrosis of the left hip (8:41). No soft tissue mass. JOINT: Lipohemarthrosis of the left hip (8:41). No significant degenerative changes. No osseous erosions. INTRAPELVIC CONTENTS: Limited images of the intrapelvic contents are unremarkable. IMPRESSION: 1. Acute nondisplaced left subcapital femoral neck fracture with associated lipohemarthrosis. Electronically signed by: Morgane Naveau MD 01/18/2024 11:46 PM EST RP Workstation: [id number]   DG  Pelvis 1-2 Views Result Date: 01/18/2024 EXAM: 1 or 2 VIEW(S) XRAY OF THE PELVIS 01/18/2024 10:26:00 PM COMPARISON: None available. CLINICAL HISTORY: Hip pain FINDINGS: BONES AND JOINTS: Impacted subcapital left femoral neck fracture is noted. Degenerative changes of the right hip. No malalignment. SOFT TISSUES: Subcutaneous soft tissue edema. IMPRESSION: 1. Impacted subcapital left femoral neck  fracture. 2. Degenerative changes of the right hip. Electronically signed by: Oneil Devonshire MD 01/18/2024 10:32 PM EST RP Workstation: [id number]   DG Femur Min 2 Views Left Result Date: 01/18/2024 EXAM: 2 VIEW(S) XRAY OF THE LEFT FEMUR 01/18/2024 10:26:00 PM COMPARISON: None available. CLINICAL HISTORY: Left hip pain Left hip pain Left hip pain Left hip pain Left hip pain FINDINGS: BONES AND JOINTS: Acute impacted subcapital left femoral neck fracture. No malalignment. SOFT TISSUES: Unremarkable. IMPRESSION: 1. Acute impacted subcapital left femoral neck fracture. Electronically signed by: Oneil Devonshire MD 01/18/2024 10:31 PM EST RP Workstation: [id number]   ECHOCARDIOGRAM COMPLETE Result Date: 01/10/2024    ECHOCARDIOGRAM REPORT   Patient Name:   DALYLAH RAMEY Date of Exam: 01/10/2024 Medical Rec #:  [id number]             Height:       63.0 in Accession #:    [id number]            Weight:       119.3 lb Date of Birth:  1955/11/02            BSA:          1.552 m Patient Age:    68 years              BP:           126/63 mmHg Patient Gender: F                     HR:           78 bpm. Exam Location:  [address] Procedure: 2D Echo, Color Doppler, Cardiac Doppler and 3D Echo (Both Spectral            and Color Flow Doppler were utilized during procedure). Indications:    Tricuspid Valve Disease I07.9  History:        Patient has prior history of Echocardiogram examinations, most                 recent 04/14/2023. Risk Factors:Hypertension.  Sonographer:    Augustin Seals RDCS Referring Phys: [id number] ARUN K  [facility] IMPRESSIONS  1. Left ventricular ejection fraction, by estimation, is 30 to 35%. Left ventricular ejection fraction by 3D volume is 39 %. The left ventricle has moderately decreased function. The left ventricle demonstrates global hypokinesis. The left ventricular internal cavity size was mildly to moderately dilated. There is mild concentric left ventricular hypertrophy. Left ventricular diastolic parameters are consistent with Grade II diastolic dysfunction (pseudonormalization). Elevated left atrial pressure.  2. Right ventricular systolic function is normal. The right ventricular size is moderately enlarged. There is moderately elevated pulmonary artery systolic pressure. The estimated right ventricular systolic pressure is 52.7 mmHg.  3. Left atrial size was severely dilated.  4. Right atrial size was moderately dilated.  5. The mitral valve is degenerative. Trivial mitral valve regurgitation. No evidence of mitral stenosis.  6. Tricuspid valve regurgitation is moderate to severe.  7. The aortic valve is tricuspid. Aortic valve regurgitation is not visualized. Aortic valve sclerosis/calcification is present, without any evidence of aortic stenosis.  8. The inferior vena cava is dilated in size with <50% respiratory variability, suggesting right atrial pressure of 15 mmHg. FINDINGS  Left Ventricle: Left ventricular ejection fraction, by estimation, is 30 to 35%. Left ventricular ejection fraction by 3D volume is 39 %. The left ventricle has moderately decreased function. The left ventricle demonstrates global hypokinesis. The left ventricular  internal cavity size was mildly to moderately dilated. There is mild concentric left ventricular hypertrophy. Left ventricular diastolic parameters are consistent with Grade II diastolic dysfunction (pseudonormalization). Elevated left atrial  pressure. Right Ventricle: The right ventricular size is moderately enlarged. No increase in right ventricular wall  thickness. Right ventricular systolic function is normal. There is moderately elevated pulmonary artery systolic pressure. The tricuspid regurgitant  velocity is 3.07 m/s, and with an assumed right atrial pressure of 15 mmHg, the estimated right ventricular systolic pressure is 52.7 mmHg. Left Atrium: Left atrial size was severely dilated. Right Atrium: Right atrial size was moderately dilated. Pericardium: There is no evidence of pericardial effusion. Mitral Valve: The mitral valve is degenerative in appearance. There is moderate thickening of the mitral valve leaflet(s). Mild mitral annular calcification. Trivial mitral valve regurgitation. No evidence of mitral valve stenosis. Tricuspid Valve: The tricuspid valve is normal in structure. Tricuspid valve regurgitation is moderate to severe. No evidence of tricuspid stenosis. Aortic Valve: The aortic valve is tricuspid. Aortic valve regurgitation is not visualized. Aortic valve sclerosis/calcification is present, without any evidence of aortic stenosis. Pulmonic Valve: The pulmonic valve was normal in structure. Pulmonic valve regurgitation is mild. No evidence of pulmonic stenosis. Aorta: The aortic root is normal in size and structure. Venous: The inferior vena cava is dilated in size with less than 50% respiratory variability, suggesting right atrial pressure of 15 mmHg. IAS/Shunts: No atrial level shunt detected by color flow Doppler. Additional Comments: 3D was performed not requiring image post processing on an independent workstation and was abnormal.  LEFT VENTRICLE PLAX 2D LVIDd:         5.03 cm         Diastology LVIDs:         4.60 cm         LV e' medial:    6.05 cm/s LV PW:         1.17 cm         LV E/e' medial:  23.8 LV IVS:        1.20 cm         LV e' lateral:   6.18 cm/s LVOT diam:     2.00 cm         LV E/e' lateral: 23.3 LV SV:         78 LV SV Index:   50 LVOT Area:     3.14 cm        3D Volume EF                                LV 3D EF:     Left                                             ventricul                                             ar  ejection                                             fraction                                             by 3D                                             volume is                                             39 %.                                 3D Volume EF:                                3D EF:        39 %                                LV EDV:       185 ml                                LV ESV:       114 ml                                LV SV:        72 ml RIGHT VENTRICLE             IVC RV Basal diam:  4.80 cm     IVC diam: 2.70 cm RV Mid diam:    3.60 cm RV S prime:     12.50 cm/s  PULMONARY VEINS TAPSE (M-mode): 2.5 cm      A Reversal Velocity: 25.90 cm/s                             Diastolic Velocity:  43.60 cm/s                             S/D Velocity:        1.20                             Systolic Velocity:   53.60 cm/s LEFT ATRIUM             Index        RIGHT ATRIUM           Index LA Vol (A2C):   83.6 ml 53.86 ml/m  RA Area:  20.60 cm LA Vol (A4C):   80.7 ml 51.99 ml/m  RA Volume:   53.90 ml  34.72 ml/m LA Biplane Vol: 84.0 ml 54.11 ml/m  AORTIC VALVE LVOT Vmax:   134.00 cm/s LVOT Vmean:  84.900 cm/s LVOT VTI:    0.247 m  AORTA Ao Root diam: 2.80 cm Ao Asc diam:  3.10 cm MITRAL VALVE                TRICUSPID VALVE MV Area (PHT): 3.72 cm     TR Peak grad:   37.7 mmHg MV Decel Time: 204 msec     TR Vmax:        307.00 cm/s MR Peak grad: 86.1 mmHg MR Mean grad: 58.0 mmHg     SHUNTS MR Vmax:      464.00 cm/s   Systemic VTI:  0.25 m MR Vmean:     365.0 cm/s    Systemic Diam: 2.00 cm MV E velocity: 144.00 cm/s MV A velocity: 123.50 cm/s MV E/A ratio:  1.17 Wilbert Bihari MD Electronically signed by Wilbert Bihari MD Signature Date/Time: 01/10/2024/1:52:05 PM    Final     Labs: BNP (last 3 results) No results for input(s): BNP in the last 8760  hours. Basic Metabolic Panel: Recent Labs  Lab 01/24/24 0459 01/26/24 0451 01/27/24 1152  NA 131* 130* 127*  K 3.9 5.3* 5.6*  CL 94* 94* 92*  CO2 33* 28 22  GLUCOSE 88 87 82  BUN 19 20 36*  CREATININE 4.39* 4.28* 6.27*  CALCIUM  8.1* 8.6* 9.1  CBC: Recent Labs  Lab 01/24/24 0459 01/26/24 0451  WBC 4.6 5.4  HGB 9.3* 9.9*  HCT 28.7* 31.0*  MCV 96.3 97.5  PLT 169 218  Urinalysis    Component Value Date/Time   COLORURINE YELLOW 03/14/2020 0300   APPEARANCEUR CLOUDY (A) 03/14/2020 0300   LABSPEC 1.023 03/14/2020 0300   PHURINE 8.0 03/14/2020 0300   GLUCOSEU 50 (A) 03/14/2020 0300   HGBUR SMALL (A) 03/14/2020 0300   BILIRUBINUR NEGATIVE 03/14/2020 0300   KETONESUR 5 (A) 03/14/2020 0300   PROTEINUR >=300 (A) 03/14/2020 0300   NITRITE NEGATIVE 03/14/2020 0300   LEUKOCYTESUR NEGATIVE 03/14/2020 0300   Sepsis Labs Recent Labs  Lab 01/24/24 0459 01/26/24 0451  WBC 4.6 5.4   Microbiology No results found for this or any previous visit (from the past 240 hours).  Time coordinating discharge:  35 minutes  SIGNED: Mennie LAMY, MD  [facility] 01/30/2024, 11:11 AM  If 7PM-7AM, please contact night-coverage [email or web address]       [1]  Allergies Allergen Reactions   Bee Venom Anaphylaxis   Shellfish Protein-Containing Drug Products Anaphylaxis, Swelling and Other (See Comments)    Sweat and Dizzy. Lobster    Azithromycin  Hives    hives   Amoxicillin     Shortness of breath    Darbepoetin Alfa  Other (See Comments)    Unknown reaction   Egg Solids, Whole     Other Reaction(s): GI Intolerance   Iodinated Contrast Media     Other Reaction(s): Unknown   Mushroom Hives   Bidil  [Isosorb Dinitrate-Hydralazine ]     headache   Chlorhexidine  Rash   Egg Protein-Containing Drug Products Nausea Only   Erythromycin Nausea Only and Other (See Comments)    Cramps    Hydralazine      headache   Iodine  Rash and Other (See Comments)    Arm flares up per pt    Penicillins Rash    Has  patient had a PCN reaction causing immediate rash, facial/tongue/throat swelling, SOB or lightheadedness with hypotension: no  Has patient had a PCN reaction causing severe rash involving mucus membranes or skin necrosis: yes  Has patient had a PCN reaction that required hospitalization: no  Has patient had a PCN reaction occurring within the last 10 years: no  If all of the above answers are NO, then may proceed with Cephalosporin use.  Other Reaction(s): GI Intolerance

## 2024-01-30 NOTE — Progress Notes (Incomplete)
 Inpatient Rehabilitation Admission Medication Review by a Pharmacist  A complete drug regimen review was completed for this patient to identify any potential clinically significant medication issues.  High Risk Drug Classes Is patient taking? Indication by Medication  Antipsychotic No   Anticoagulant No   Antibiotic No   Opioid Yes Tussionex- cough Norco- acute pain  Antiplatelet Yes Aspirin - cva ppx  Hypoglycemics/insulin  No   Vasoactive Medication Yes Toprol - HTN  Chemotherapy No   Other Yes Auryxia - hyperphos Robaxin - muscle spasms     Type of Medication Issue Identified Description of Issue Recommendation(s)  Drug Interaction(s) (clinically significant)     Duplicate Therapy     Allergy     No Medication Administration End Date     Incorrect Dose     Additional Drug Therapy Needed     Significant med changes from prior encounter (inform family/care partners about these prior to discharge).    Other       Clinically significant medication issues were identified that warrant physician communication and completion of prescribed/recommended actions by midnight of the next day:  No   Time spent performing this drug regimen review (minutes):  30   Cory Kitt BS, PharmD, BCPS Clinical Pharmacist 01/30/2024 2:18 PM  Contact: [phone] after 3 PM

## 2024-01-30 NOTE — Progress Notes (Addendum)
 " PROGRESS NOTE Barbara Crawford  FMW:[id number] DOB: [birth year] DOA: 01/18/2024 PCP: Tammy Tari DASEN, PA-C  Brief Narrative/Hospital Course: Barbara Crawford is a 69 y.o. female with PMH of  ESRD on HD, TTS, chronic hypoxic respiratory failure on 2 L O2 at baseline, HFrEF 30 to 35%, CAD, HTN, anxiety, chronic dyspnea presented to ED with left leg pain and hip pain.  Patient reported that she was in the medical transport after she had finished dialysis that stopped short and she fell into the aisle.  She had immediate left hip pain, could not get up again after sitting down.  Presented to ED where x-rays showed a left hip fracture.Underwent percutaneous fixation left femoral neck 1/16 (Dr Kendal), hemoglobin stable, pain controlled.  At this time waiting for CIR placement and remains medically stable.  Subjective: Seen and examined  Patient had low-grade temperature 100.5 vitals stable, denies cough cold runny nose headache chest pain labs ordered this morning  Awaiting placement  Assessment and plan:  Closed left femoral fracture: S/P percutaneous fixation left femoral neck fracture 1/16 (Dr Kendal) Continue multimodal pain management, continue aspirin  325MG  daily for DVT prophylaxis per Ortho. dressing has been removed surgical site dry intact.Continue PT OT, WBAT LLE.  ESRD on HD TTS Secondary hyperparathyroidism/hyperphosphatemia Anemia of chronic kidney disease hb ~9-10g Mild hyperkalemia from ESRD: Nephro following for HD  S/P HD again last night, had mild hyperkalemia.  Monitor electrolytes  Continue binders-Auryxia  and monitor volume status hb and LABS Recent Labs    12/31/23 0307 01/01/24 0231 01/19/24 0110 01/19/24 1515 01/20/24 0453 01/20/24 1356 01/23/24 0418 01/24/24 0459 01/26/24 0451 01/27/24 1152  BUN 33* 52* 16 21 28* 12 39* 19 20 36*  CREATININE 6.58* 8.18* 4.21* 4.99* 5.95* 3.00* 7.83* 4.39* 4.28* 6.27*  CO2 24 22 29 24 23 27 28  33* 28 22  K 4.0  4.3 3.4* 3.8 4.5 3.7 4.1 3.9 5.3* 5.6*   Low-grade temp: If recurs will check respiratory virus panel, check CBC BMP  Vitamin D  deficiency Continue vitamin D  replacement    Essential hypertension Chronic HFrEF: Euvolemic, BP stable.Continue Toprol -XL 25 mg daily.  Adjust volume  W/ hemodialysis   Chronic hypoxic respiratory failure Currently stable, no wheezing, continue 2 L O2 via [place], at baseline   History of CAD Stable, continue Toprol -XL, aspirin .     Cough Recent pneumonia on 12/27: Patient was admitted 12/30/2023 -01/02/2024 for pneumonia, completed the course of antibiotics- cxr- left-sided pneumonia with pleural effusion, likely residual from recent pneumonia.  Currently asymptomatic.follow-up x-ray to document resolution in 3 wk   Moderate malnutrition with Body mass index is 20.35 kg/m.: Will benefit with PCP follow-up, weight loss,healthy lifestyle and outpatient sleep eval if not done.  Mobility: PT Orders: Active PT Follow up Rec: Acute Inpatient Rehab (3hours/Day)01/29/2024 1500   DVT prophylaxis: SCDs Start: 01/19/24 1450 SCDs Start: 01/18/24 2341 Place TED hose Start: 01/18/24 2341 Code Status:   Code Status: Full Code Family Communication: plan of care discussed with patient at bedside. Patient status is: Remains hospitalized because of severity of illness Level of care: Med-Surg   Dispo: The patient is from: Lives with her daughter, independent at baseline            Anticipated disposition: she says CIR  was denied> appeal and at this time insurance has approved.  Waiting for CIR.  Objective: Vitals last 24 hrs: Vitals:   01/29/24 1636 01/29/24 2048 01/30/24 0432 01/30/24 0811  BP: 130/68 107/62 130/69  132/68  Pulse: 97 89 93 96  Resp: 18 18 18 16   Temp: (!) 100.5 F (38.1 C) 98.1 F (36.7 C) 98 F (36.7 C) 98.3 F (36.8 C)  TempSrc: Oral Oral Oral Oral  SpO2: 98% 100% 98% 98%  Weight:      Height:       Physical Examination: General exam:  AAOX3, pleasant not in distress HEENT:Oral mucosa moist, Ear/Nose WNL grossly Respiratory system: Bilaterally diminished at base, no wheezing or crackles  Cardiovascular system: S1 & S2 +, No JVD. Gastrointestinal system: Abdomen soft,NT,ND, BS+ Nervous System: AAO, non focal Extremities: extremities warm, leg edema neg so, surgical site dry  Skin: Warm, no rashes MSK: Normal muscle bulk,tone, power, frail-appearing.  Medications reviewed:  Scheduled Meds:  aspirin  EC  325 mg Oral Daily   benzonatate   200 mg Oral TID   chlorpheniramine-HYDROcodone   5 mL Oral Q12H   cholecalciferol   2,000 Units Oral Daily   docusate sodium   100 mg Oral BID   ferric citrate   420 mg Oral TID WC   guaiFENesin   600 mg Oral BID   metoprolol  succinate  25 mg Oral Daily   multivitamin  1 tablet Oral QHS   paricalcitol   4.5 mcg Intravenous Q T,Th,Sa-HD   sodium chloride  flush  3 mL Intravenous Q12H   sodium chloride  flush  3 mL Intravenous Q12H   Continuous Infusions: Diet: Diet Order             Diet Heart Room service appropriate? Yes; Fluid consistency: Thin  Diet effective now                     Unresulted Labs (From admission, onward)     Start     Ordered   01/30/24 0817  CBC  ONCE - STAT,   STAT       Question:  Specimen collection method  Answer:  Lab=Lab collect   01/30/24 0816   01/30/24 0816  Renal function panel  ONCE - STAT,   STAT       Question:  Specimen collection method  Answer:  Lab=Lab collect   01/30/24 0816   Signed and Held  Renal function panel  Tomorrow morning,   R       Question:  Specimen collection method  Answer:  Lab=Lab collect   Signed and Held   Signed and Held  CBC  Tomorrow morning,   R       Question:  Specimen collection method  Answer:  Lab=Lab collect   Signed and Held   Signed and Held  Renal function panel  Once,   R       Question:  Specimen collection method  Answer:  Lab=Lab collect   Signed and Held   Signed and Held  CBC  Once,   R        Question:  Specimen collection method  Answer:  Lab=Lab collect   Signed and Held           Data Reviewed: I have personally reviewed following labs and imaging studies ( see epic result tab) CBC: Recent Labs  Lab 01/24/24 0459 01/26/24 0451  WBC 4.6 5.4  HGB 9.3* 9.9*  HCT 28.7* 31.0*  MCV 96.3 97.5  PLT 169 218   CMP: Recent Labs  Lab 01/24/24 0459 01/26/24 0451 01/27/24 1152  NA 131* 130* 127*  K 3.9 5.3* 5.6*  CL 94* 94* 92*  CO2 33* 28 22  GLUCOSE 88  87 82  BUN 19 20 36*  CREATININE 4.39* 4.28* 6.27*  CALCIUM  8.1* 8.6* 9.1   GFR: Estimated Creatinine Clearance: 7.2 mL/min (A) (by C-G formula based on SCr of 6.27 mg/dL (H)). No results for input(s): AST, ALT, ALKPHOS, BILITOT, PROT, ALBUMIN  in the last 168 hours. No results for input(s): LIPASE, AMYLASE in the last 168 hours. No results for input(s): AMMONIA in the last 168 hours. Coagulation Profile: No results for input(s): INR, PROTIME in the last 168 hours. Antimicrobials/Microbiology: Anti-infectives (From admission, onward)    Start     Dose/Rate Route Frequency Ordered Stop   01/19/24 1545  ceFAZolin  (ANCEF ) IVPB 2g/100 mL premix  Status:  Discontinued        2 g 200 mL/hr over 30 Minutes Intravenous Every 8 hours 01/19/24 1450 01/19/24 1453   01/19/24 1100  ceFAZolin  (ANCEF ) IVPB 1 g/50 mL premix        1 g 100 mL/hr over 30 Minutes Intravenous On call to O.R. 01/19/24 1048 01/19/24 1247   01/19/24 1047  ceFAZolin  (ANCEF ) 2-4 GM/100ML-% IVPB       Note to Pharmacy: Edsel Toni HERO: cabinet override      01/19/24 1047 01/19/24 2259         Component Value Date/Time   SDES BLOOD RIGHT ARM 01/01/2024 1316   SDES BLOOD RIGHT ARM 01/01/2024 1316   SPECREQUEST  01/01/2024 1316    BOTTLES DRAWN AEROBIC ONLY Blood Culture adequate volume   SPECREQUEST  01/01/2024 1316    BOTTLES DRAWN AEROBIC ONLY Blood Culture adequate volume   CULT  01/01/2024 1316    NO GROWTH 5  DAYS Performed at [facility], [address], [place], [place] [address]    CULT  01/01/2024 1316    NO GROWTH 5 DAYS Performed at [facility], [address] [place], [place] [address]    REPTSTATUS 01/06/2024 FINAL 01/01/2024 1316   REPTSTATUS 01/06/2024 FINAL 01/01/2024 1316    Procedures: Procedures (LRB): FIXATION, FEMUR, NECK, PERCUTANEOUS, USING SCREW (Left)   Mennie LAMY, MD [facility] 01/30/2024, 10:36 AM   "

## 2024-01-30 NOTE — Plan of Care (Signed)
  Problem: Pain Managment: Goal: General experience of comfort will improve and/or be controlled Outcome: Progressing   Problem: Safety: Goal: Ability to remain free from injury will improve Outcome: Progressing

## 2024-01-30 NOTE — Progress Notes (Signed)
 PMR Admission Coordinator Pre-Admission Assessment   Patient: Barbara Crawford is an 69 y.o., female MRN: [id number] DOB: 08/05/1955 Height: 5' 3 (160 cm) Weight: 50.4 kg   Insurance Information HMO: yes    PPO:      PCP:      IPA:      80/20:     OTHER:  PRIMARY: BCBS Medicare      Policy#: [id number]      Subscriber: Pt CM Name: [name]     Phone#:  [phone]    Fax#:  [phone] Pre-Cert#: [id number]  I received auth for CIR from  appeals department  at [facility]  for admit 01/26/24 through 01/29/24.  Updates due to [facility] at fax listed above.     Employer:  Benefits:  Phone #:      Name:  Eff Date: 01/04/2024 - 01/02/2198 Deductible: does not have one OOP Max: $4,900 ($0 met)   CIR: $400 co-pay day for days 1-6, $0 copay for days 7-90 SNF: $0.00 Copayment per day for days 1-20; $218 Copayment per day for days 21-60; $0.00 copayment for days 61-100 Maximum of 100 days/benefit period Outpatient:  $15 copay/visit Home Health:  100% coverage DME: 80% coverage, 20% co-insurance Providers: in network  SECONDARY:       Policy#:      Phone#:      Artist:       Phone#:    The Best Boy for patients in Inpatient Rehabilitation Facilities with attached Privacy Act Statement-Health Care Records was provided and verbally reviewed with: Pt   Emergency Contact Information Contact Information       Name Relation Home Work Mobile    [place] Daughter     [phone]    [name]     [phone]    [name] [phone]   [phone]         Other Contacts       Name Relation Home Work Mobile    Scott,James Jerona Blades     [phone]           Current Medical History  Patient Admitting Diagnosis: Femur fx History of Present Illness: Barbara Crawford is a 69 y.o. female with medical history significant of ESRD on dialysis TTS schedule, chronic hypoxic respiratory failure 2 L oxygen  at  baseline, HFrEF 30 to 35%, CAD, essential hypertension, generalized anxiety disorder and chronic dyspnea presented to [facility] emergency department  on 01/18/24 complaining of left-sided leg pain and hip pain after falling from vehicle returning from dialysis.At presentation to ED patient was  hemodynamically stable.  X-ray femur showed acute impacted subcapital left femoral neck fracture.X-ray pelvis showed  Impacted subcapital left femoral neck fracture.and degenerative changes of the right hip.Underwent percutaneous fixation left femoral neck 1/16 (Dr Kendal). She was seen by PT/OT and they recommended CIR to assist return to PLOF.          Patient's medical record from [facility]  has been reviewed by the rehabilitation admission coordinator and physician.   Past Medical History      Past Medical History:  Diagnosis Date   Anxiety     ESRD (end stage renal disease) on dialysis (HCC)     Heart failure with mildly reduced ejection fraction (HFmrEF) (HCC)     Hypertension            Has the patient had major surgery during 100 days prior to admission? Yes  Family History   family history includes Dementia in her mother; Diabetes in her sister; Hypertension in her son.   Current Medications [Current Medications]  [Current Medications]   Current Facility-Administered Medications:    acetaminophen  (TYLENOL ) tablet 325-650 mg, 325-650 mg, Oral, Q6H PRN, Danton Lauraine LABOR, PA-C, 325 mg at 01/23/24 2035   aspirin  EC tablet 325 mg, 325 mg, Oral, Daily, Danton Lauraine LABOR, PA-C, 325 mg at 01/24/24 9058   benzonatate  (TESSALON ) capsule 200 mg, 200 mg, Oral, TID, Danton Lauraine LABOR, PA-C, 200 mg at 01/19/24 1508   chlorpheniramine-HYDROcodone  (TUSSIONEX) 10-8 MG/5ML suspension 5 mL, 5 mL, Oral, Q12H, Rai, Ripudeep K, MD   cholecalciferol  (VITAMIN D3) 25 MCG (1000 UNIT) tablet 2,000 Units, 2,000 Units, Oral, Daily, Danton Lauraine LABOR, PA-C, 2,000 Units at  01/24/24 0941   diphenhydrAMINE  (BENADRYL ) 12.5 MG/5ML elixir 12.5-25 mg, 12.5-25 mg, Oral, Q4H PRN, Danton Lauraine LABOR, PA-C   docusate sodium  (COLACE) capsule 100 mg, 100 mg, Oral, BID, Danton Lauraine LABOR, PA-C, 100 mg at 01/24/24 9057   ferric citrate  (AURYXIA ) tablet 210 mg, 210 mg, Oral, PRN, Danton Lauraine LABOR, PA-C   ferric citrate  (AURYXIA ) tablet 420 mg, 420 mg, Oral, TID WC, McClung, Sarah A, PA-C   guaiFENesin  (MUCINEX ) 12 hr tablet 600 mg, 600 mg, Oral, BID, McClung, Sarah A, PA-C   HYDROcodone -acetaminophen  (NORCO/VICODIN) 5-325 MG per tablet 1-2 tablet, 1-2 tablet, Oral, Q6H PRN, McClung, Sarah A, PA-C   HYDROmorphone  (DILAUDID ) injection 0.5 mg, 0.5 mg, Intravenous, Q3H PRN, Danton, Sarah A, PA-C   methocarbamol  (ROBAXIN ) injection 500 mg, 500 mg, Intravenous, Q6H PRN, McClung, Sarah A, PA-C   metoCLOPramide  (REGLAN ) tablet 5-10 mg, 5-10 mg, Oral, Q8H PRN **OR** metoCLOPramide  (REGLAN ) injection 5-10 mg, 5-10 mg, Intravenous, Q8H PRN, McClung, Sarah A, PA-C   metoprolol  succinate (TOPROL -XL) 24 hr tablet 25 mg, 25 mg, Oral, Daily, Danton Lauraine LABOR, PA-C, 25 mg at 01/24/24 0942   multivitamin (RENA-VIT) tablet 1 tablet, 1 tablet, Oral, QHS, Danton Lauraine LABOR, PA-C, 1 tablet at 01/23/24 2036   ondansetron  (ZOFRAN ) tablet 4 mg, 4 mg, Oral, Q6H PRN **OR** ondansetron  (ZOFRAN ) injection 4 mg, 4 mg, Intravenous, Q6H PRN, McClung, Sarah A, PA-C   paricalcitol  (ZEMPLAR ) injection 4.5 mcg, 4.5 mcg, Intravenous, Q T,Th,Sa-HD, Dennise Hoes, MD, 4.5 mcg at 01/20/24 1240   polyethylene glycol (MIRALAX  / GLYCOLAX ) packet 17 g, 17 g, Oral, Daily PRN, Danton Lauraine LABOR, PA-C, 17 g at 01/24/24 0217   sodium chloride  flush (NS) 0.9 % injection 3 mL, 3 mL, Intravenous, Q12H, McClung, Sarah A, PA-C, 3 mL at 01/24/24 9056   sodium chloride  flush (NS) 0.9 % injection 3 mL, 3 mL, Intravenous, Q12H, McClung, Sarah A, PA-C, 3 mL at 01/24/24 9056   sodium chloride  flush (NS) 0.9 % injection 3 mL, 3 mL, Intravenous,  PRN, Danton, Sarah A, PA-C   Patients Current Diet:  Diet Order                  Diet Heart Room service appropriate? Yes; Fluid consistency: Thin  Diet effective now                         Precautions / Restrictions Precautions Precautions: Fall Restrictions Weight Bearing Restrictions Per Provider Order: Yes LLE Weight Bearing Per Provider Order: Weight bearing as tolerated    Has the patient had 2 or more falls or a fall with injury in the past year? Yes   Prior Activity Level Community (  5-7x/wk): Pt. active in the community PTA   Prior Functional Level Self Care: Did the patient need help bathing, dressing, using the toilet or eating? Independent   Indoor Mobility: Did the patient need assistance with walking from room to room (with or without device)? Independent   Stairs: Did the patient need assistance with internal or external stairs (with or without device)? Independent   Functional Cognition: Did the patient need help planning regular tasks such as shopping or remembering to take medications? Independent   Patient Information Are you of Hispanic, Latino/a,or Spanish origin?: A. No, not of Hispanic, Latino/a, or Spanish origin What is your race?: B. Black or African American Do you need or want an interpreter to communicate with a doctor or health care staff?: 0. No   Patient's Response To:  Health Literacy and Transportation Is the patient able to respond to health literacy and transportation needs?: Yes Health Literacy - How often do you need to have someone help you when you read instructions, pamphlets, or other written material from your doctor or pharmacy?: Never In the past 12 months, has lack of transportation kept you from medical appointments or from getting medications?: No In the past 12 months, has lack of transportation kept you from meetings, work, or from getting things needed for daily living?: No   Home Assistive Devices / Equipment Home  Equipment: [employer] (2 wheels), [employer] - quad, BSC/3in1   Prior Device Use: Indicate devices/aids used by the patient prior to current illness, exacerbation or injury? None of the above   Current Functional Level Cognition   Orientation Level: Oriented X4    Extremity Assessment (includes Sensation/Coordination)   Upper Extremity Assessment: Defer to OT evaluation  Lower Extremity Assessment: LLE deficits/detail LLE Deficits / Details: L femur fx     ADLs   Overall ADL's : Needs assistance/impaired Eating/Feeding: Set up, Sitting Grooming: Set up, Sitting Upper Body Bathing: Minimal assistance, Sitting Lower Body Bathing: Maximal assistance Upper Body Dressing : Set up, Sitting Lower Body Dressing: Maximal assistance Toilet Transfer: Minimal assistance Toileting- Clothing Manipulation and Hygiene: Minimal assistance     Mobility   Overal bed mobility: Needs Assistance Bed Mobility: Supine to Sit, Sit to Supine Supine to sit: Mod assist, HOB elevated Sit to supine: Min assist General bed mobility comments: Mod A for management of LLE; practiced with use of belt around L foot to incr pt independence with task     Transfers   Overall transfer level: Needs assistance Equipment used: Rolling walker (2 wheels) Transfers: Sit to/from Stand Sit to Stand: Min assist Bed to/from chair/wheelchair/BSC transfer type:: Step pivot Step pivot transfers: Min assist, +2 physical assistance, +2 safety/equipment General transfer comment: STS from lowest bed surface with RW, minA +1. cues for setup/sequencing. Able to perform step pivot into recliner w/ min cues for sequencing and RW management, reduced eccentric control but able to mitigate with UE support on armrest. additional STS from recliner with CGA and RW     Ambulation / Gait / Stairs / Wheelchair Mobility   Ambulation/Gait Ambulation/Gait assistance: Contact guard assist Gait Distance (Feet): 30 Feet Assistive device: Rolling  walker (2 wheels) Gait Pattern/deviations: Step-to pattern, Narrow base of support, Trunk flexed General Gait Details: cues for posture/pacing, RW management in close environment, inc time/effort.     Posture / Balance Dynamic Sitting Balance Sitting balance - Comments: received sitting EOB without UE support, working on lunch. no apparent instability. Balance Overall balance assessment: Needs assistance Sitting-balance support:  No upper extremity supported, Feet supported Sitting balance-Leahy Scale: Good Sitting balance - Comments: received sitting EOB without UE support, working on lunch. no apparent instability. Standing balance support: No upper extremity supported, During functional activity Standing balance-Leahy Scale: Good Standing balance comment: use of RW for safety/comfort, but is able to wash hands at sink without reliance on device. also able to reach to dry hands without instability/LOB. CGA provided for safety     Special considerations/life events  Dialysis: Hemodialysis Tuesday, Thursday, and Saturday    Previous Home Environment (from acute therapy documentation) Living Arrangements: Children  Lives With: Spouse Available Help at Discharge: Family, Available PRN/intermittently Type of Home: Apartment Home Layout: One level Home Access: Stairs to enter Entrance Stairs-Rails: Left Entrance Stairs-Number of Steps: flight Bathroom Shower/Tub: Health Visitor: Standard Home Care Services: No Additional Comments: Pulled from previous admission 3 weeks ago.   Discharge Living Setting Plans for Discharge Living Setting: Patient's home Type of Home at Discharge: House Discharge Home Layout: One level Discharge Home Access: Stairs to enter Entrance Stairs-Rails: None Entrance Stairs-Number of Steps: 15 Discharge Bathroom Shower/Tub: Walk-in shower Discharge Bathroom Toilet: Standard Discharge Bathroom Accessibility: Yes How Accessible: Accessible via  walker, Accessible via wheelchair Does the patient have any problems obtaining your medications?: No   Social/Family/Support Systems Patient Roles: Other (Comment) Contact Information: [phone] Anticipated Caregiver: Daughter can take FMLA Caregiver Availability: 24/7 Discharge Plan Discussed with Primary Caregiver: Yes Is Caregiver In Agreement with Plan?: Yes Does Caregiver/Family have Issues with Lodging/Transportation while Pt is in Rehab?: No   Goals Patient/Family Goal for Rehab: PT/OT Supervision Expected length of stay: 12-14 days Pt/Family Agrees to Admission and willing to participate: Yes Program Orientation Provided & Reviewed with Pt/Caregiver Including Roles  & Responsibilities: Yes   Decrease burden of Care through IP rehab admission: Not anticipated   Possible need for SNF placement upon discharge: not anticipated   Patient Condition: I have reviewed medical records from [facility], spoken with CM, and patient and daughter. I met with patient at the bedside for inpatient rehabilitation assessment.  Patient will benefit from ongoing PT and OT, can actively participate in 3 hours of therapy a day 5 days of the week, and can make measurable gains during the admission.  Patient will also benefit from the coordinated team approach during an Inpatient Acute Rehabilitation admission.  The patient will receive intensive therapy as well as Rehabilitation physician, nursing, social worker, and care management interventions.  Due to safety, skin/wound care, disease management, medication administration, pain management, and patient education the patient requires 24 hour a day rehabilitation nursing.  The patient is currently Min a To CGA with mobility and basic ADLs.  Discharge setting and therapy post discharge at home with home health is anticipated.  Patient has agreed to participate in the Acute Inpatient Rehabilitation Program and will admit today.    Preadmission Screen Completed By:  Leita KATHEE Kleine, 01/24/2024 1:18 PM ______________________________________________________________________   Discussed status with Dr. Cornelio on 01/30/24 at 900 and received approval for admission today.   Admission Coordinator:  Leita KATHEE Kleine, CCC-SLP, time 1256/Date 01/30/24    Assessment/Plan: Diagnosis: L femoral neck fx s/p perc fixation Does the need for close, 24 hr/day Medical supervision in concert with the patient's rehab needs make it unreasonable for this patient to be served in a less intensive setting? Yes Co-Morbidities requiring supervision/potential complications: ESRD on T/H/S, chronic resp failure on home O2 2L by [facility]; sCHF, HTN, anxiety,  Due to bowel management, safety, skin/wound care, disease management, medication administration, pain management, and patient education, does the patient require 24 hr/day rehab nursing? Yes Does the patient require coordinated care of a physician, rehab nurse, PT, OT, and SLP to address physical and functional deficits in the context of the above medical diagnosis(es)? Yes Addressing deficits in the following areas: balance, endurance, locomotion, strength, transferring, bowel/bladder control, bathing, dressing, feeding, grooming, and toileting Can the patient actively participate in an intensive therapy program of at least 3 hrs of therapy 5 days a week? Yes The potential for patient to make measurable gains while on inpatient rehab is good Anticipated functional outcomes upon discharge from inpatient rehab: supervision PT, supervision OT, n/a SLP Estimated rehab length of stay to reach the above functional goals is: 12-14 days Anticipated discharge destination: Home 10. Overall Rehab/Functional Prognosis: good     MD Signature:             Revision History  Date/Time User Provider Type Action  01/30/2024  2:31 PM Cornelio Bouchard, MD Physician Sign  01/30/2024 12:56 PM Cecilie Leita NOVAK, CCC-SLP Rehab  Admission Coordinator Share  01/30/2024 11:53 AM Cecilie Leita NOVAK, CCC-SLP Rehab Admission Coordinator Share  01/24/2024  3:46 PM Cecilie Leita NOVAK RAEJEAN Rehab Admission Coordinator Share  01/24/2024  1:23 PM Cecilie Leita NOVAK RAEJEAN Rehab Admission Coordinator

## 2024-01-30 NOTE — Progress Notes (Signed)
 Inpatient Rehab Admissions Coordinator:    I will admit Pt. To CIR today. RN may call report to [phone].   Pt.will admit to CIR for estimated 5-7 days with the goal of dc home with her daughter.   Leita Kleine, MS, CCC-SLP Rehab Admissions Coordinator  [phone] (celll) [phone] (office)

## 2024-01-30 NOTE — Progress Notes (Signed)
 " [facility] Progress Note   Subjective:   Reports she is a little out of breath after moving around but tolerating PT, walking hallways this am. Denies CP, dizziness, nausea.   Objective Vitals:   01/29/24 1636 01/29/24 2048 01/30/24 0432 01/30/24 0811  BP: 130/68 107/62 130/69 132/68  Pulse: 97 89 93 96  Resp: 18 18 18 16   Temp: (!) 100.5 F (38.1 C) 98.1 F (36.7 C) 98 F (36.7 C) 98.3 F (36.8 C)  TempSrc: Oral Oral Oral Oral  SpO2: 98% 100% 98% 98%  Weight:      Height:       Physical Exam General: Well appearing, NAD.  Heart: RRR Lungs: CTA anteriorly Abdomen: soft Extremities: no LE edema Dialysis Access: LUE AVG +t/b  Additional Objective Labs: Basic Metabolic Panel: Recent Labs  Lab 01/24/24 0459 01/26/24 0451 01/27/24 1152  NA 131* 130* 127*  K 3.9 5.3* 5.6*  CL 94* 94* 92*  CO2 33* 28 22  GLUCOSE 88 87 82  BUN 19 20 36*  CREATININE 4.39* 4.28* 6.27*  CALCIUM  8.1* 8.6* 9.1   Liver Function Tests: No results for input(s): AST, ALT, ALKPHOS, BILITOT, PROT, ALBUMIN  in the last 168 hours. No results for input(s): LIPASE, AMYLASE in the last 168 hours. CBC: Recent Labs  Lab 01/24/24 0459 01/26/24 0451  WBC 4.6 5.4  HGB 9.3* 9.9*  HCT 28.7* 31.0*  MCV 96.3 97.5  PLT 169 218   Blood Culture    Component Value Date/Time   SDES BLOOD RIGHT ARM 01/01/2024 1316   SDES BLOOD RIGHT ARM 01/01/2024 1316   SPECREQUEST  01/01/2024 1316    BOTTLES DRAWN AEROBIC ONLY Blood Culture adequate volume   SPECREQUEST  01/01/2024 1316    BOTTLES DRAWN AEROBIC ONLY Blood Culture adequate volume   CULT  01/01/2024 1316    NO GROWTH 5 DAYS Performed at [facility], [address] [place], [place] [address]    CULT  01/01/2024 1316    NO GROWTH 5 DAYS Performed at [facility], [address] [place], [place] [address]    REPTSTATUS 01/06/2024 FINAL 01/01/2024 1316   REPTSTATUS 01/06/2024 FINAL 01/01/2024 1316     Cardiac Enzymes: No results for input(s): CKTOTAL, CKMB, CKMBINDEX, TROPONINI in the last 168 hours. CBG: No results for input(s): GLUCAP in the last 168 hours. Iron  Studies: No results for input(s): IRON , TIBC, TRANSFERRIN, FERRITIN in the last 72 hours. @lablastinr3 @ Studies/Results: No results found. Medications:   aspirin  EC  325 mg Oral Daily   benzonatate   200 mg Oral TID   chlorpheniramine-HYDROcodone   5 mL Oral Q12H   cholecalciferol   2,000 Units Oral Daily   docusate sodium   100 mg Oral BID   ferric citrate   420 mg Oral TID WC   guaiFENesin   600 mg Oral BID   metoprolol  succinate  25 mg Oral Daily   multivitamin  1 tablet Oral QHS   paricalcitol   4.5 mcg Intravenous Q T,Th,Sa-HD   sodium chloride  flush  3 mL Intravenous Q12H   sodium chloride  flush  3 mL Intravenous Q12H    Dialysis Orders: [facility] TTS EDW 54 kg. 3.5 hours. 2K/2.5 calcium . AVG flow rates: 400/600. Heparin : None. Meds: EPO 6200 units every treatment, Venofer, Zemplar  4.5 mcg every treatment. Binders: Auryxia  3 tabs 3 times daily AC.     Assessment/Plan:  ESRD  - TTS schedule - Next HD 1/27; walking the halls today.   Closed femoral fracture -  Fall while riding home from HD on bus - S/p surgery 1/16 - Ongoing PT/OT   Volume/ hypertension  -  BP decent, no edema on exam - Leaving under EDW here, will lower on discharge   Anemia of Chronic Kidney Disease - Hgb 9.9 - Allergy to Aranesp , transfuse prn   Secondary Hyperparathyroidism/Hyperphosphatemia - CorrCa ok, Phos high - continue Auryxia  as binder   Dispo - Appealing for CIR. SNF being considered as backup.  "

## 2024-01-31 ENCOUNTER — Other Ambulatory Visit: Payer: Self-pay

## 2024-01-31 ENCOUNTER — Inpatient Hospital Stay (HOSPITAL_COMMUNITY)

## 2024-01-31 ENCOUNTER — Encounter (HOSPITAL_COMMUNITY): Payer: Self-pay | Admitting: Physical Medicine & Rehabilitation

## 2024-01-31 DIAGNOSIS — Z992 Dependence on renal dialysis: Secondary | ICD-10-CM | POA: Diagnosis not present

## 2024-01-31 DIAGNOSIS — N185 Chronic kidney disease, stage 5: Secondary | ICD-10-CM

## 2024-01-31 DIAGNOSIS — R509 Fever, unspecified: Secondary | ICD-10-CM

## 2024-01-31 DIAGNOSIS — E876 Hypokalemia: Secondary | ICD-10-CM

## 2024-01-31 DIAGNOSIS — D631 Anemia in chronic kidney disease: Secondary | ICD-10-CM | POA: Diagnosis not present

## 2024-01-31 DIAGNOSIS — S72002A Fracture of unspecified part of neck of left femur, initial encounter for closed fracture: Secondary | ICD-10-CM | POA: Diagnosis not present

## 2024-01-31 LAB — HEPATITIS B SURFACE ANTIGEN: Hepatitis B Surface Ag: NONREACTIVE

## 2024-01-31 MED ORDER — SODIUM ZIRCONIUM CYCLOSILICATE 10 G PO PACK
10.0000 g | PACK | Freq: Once | ORAL | Status: AC
  Administered 2024-01-31: 10 g via ORAL
  Filled 2024-01-31: qty 1

## 2024-01-31 NOTE — Plan of Care (Signed)
" °  Problem: Consults Goal: RH GENERAL PATIENT EDUCATION Description: See Patient Education module for education specifics. Outcome: Progressing   Problem: RH BOWEL ELIMINATION Goal: RH STG MANAGE BOWEL WITH ASSISTANCE Description: STG Manage Bowel with mod I Assistance. Outcome: Progressing Goal: RH STG MANAGE BOWEL W/MEDICATION W/ASSISTANCE Description: STG Manage Bowel with Medication with mod I Assistance. Outcome: Progressing   Problem: RH SAFETY Goal: RH STG ADHERE TO SAFETY PRECAUTIONS W/ASSISTANCE/DEVICE Description: STG Adhere to Safety Precautions With cues Assistance/Device. Outcome: Progressing   Problem: RH PAIN MANAGEMENT Goal: RH STG PAIN MANAGED AT OR BELOW PT'S PAIN GOAL Description: Pain < 4 with prns Outcome: Progressing   Problem: RH KNOWLEDGE DEFICIT GENERAL Goal: RH STG INCREASE KNOWLEDGE OF SELF CARE AFTER HOSPITALIZATION Description: Patient will be able to manage care at discharge using educational resources for medications and safety independently Outcome: Progressing   "

## 2024-01-31 NOTE — Patient Care Conference (Signed)
 Inpatient RehabilitationTeam Conference and Plan of Care Update Date: 01/31/2024   Time: 9:51 AM    Patient Name: Barbara Crawford      Medical Record Number: [id number]  Date of Birth: May 05, 1955 Sex: Female         Room/Bed: 4W18C/4W18C-01 Payor Info: Payor: [employer]UE [employer] MEDICARE / Plan: BCBS MEDICARE / Product Type: *No Product type* /    Admit Date/Time:  01/30/2024  6:34 PM  Primary Diagnosis:  Displaced fracture of left femoral neck [facility])  Hospital Problems: Principal Problem:   Displaced fracture of left femoral neck [facility])    Expected Discharge Date: Expected Discharge Date: 02/08/24 [place] pending)  Team Members Present: Physician leading conference: Dr. Murray Collier Social Worker Present: Waverly Gentry, LCSW-A Nurse Present: Barnie Ronde, RN PT Present: Izetta Seip, PT OT Present: Michaelyn Seip, OT PPS Coordinator present : Eleanor Colon, SLP     Current Status/Progress Goal Weekly Team Focus  Bowel/Bladder   PT is continent to bowel and bladder   to maintain continent bladder and bowel   help in toileting    Swallow/Nutrition/ Hydration               ADL's   CGA to min A, decreased balance,   SUP to mod I   barrier: functional endurance, balance, some pain, stairs. 1st eval today.    Mobility   pending eval           Communication                Safety/Cognition/ Behavioral Observations               Pain   pt verbalizes no pain   to maintain pain less than score of two   painn assessment every shift    Skin   Closed Surgical Incision right hip   no sings of infection during this admission  q shift assessment      Discharge Planning:  Per PMR, plans to discharge home with daughter who will take FMLA as needed. Will await therapy follow-up recommendations.   Team Discussion: Patient admitted post fall on the transport bus with displaced left femur fracture.  Limited by ESRD; HD T, H, Sa and Chronic Respiratory  Failure on supplemental oxygen   2L/Min [place] PTA, pain with mobility and balance deficits. New onset low-grade fever with history of pna with pleural effusion.    Patient on target to meet rehab goals: yes, currently needs CGA - min assist overall. Completes lower body care seated edge of bed using adaptive equipment.   *See Care Plan and progress notes for long and short-term goals.   Revisions to Treatment Plan:  N/a   Teaching Needs: Safety, medications, transfers, toileting, etc.  Current Barriers to Discharge: Decreased caregiver support and Home enviroment access/layout; steps with wide rails  Possible Resolutions to Barriers: Family education     Medical Summary Current Status: hip fracture, fevers, ESRD, HTN, cough,HTN  Barriers to Discharge: Medical stability;Self-care education;Renal Insufficiency/Failure  Barriers to Discharge Comments: hip fracture, fevers, ESRD, HTN, cough Possible Resolutions to [employer] Focus: Monitor CBC, conisder respiratory viral panel, dialysis   Continued Need for Acute Rehabilitation Level of Care: The patient requires daily medical management by a physician with specialized training in physical medicine and rehabilitation for the following reasons: Direction of a multidisciplinary physical rehabilitation program to maximize functional independence : Yes Medical management of patient stability for increased activity during participation in an intensive rehabilitation regime.: Yes  Analysis of laboratory values and/or radiology reports with any subsequent need for medication adjustment and/or medical intervention. : Yes   I attest that I was present, lead the team conference, and concur with the assessment and plan of the team.   Fredericka Sober B 02/01/2024, 9:10 AM

## 2024-01-31 NOTE — Progress Notes (Signed)
 Contacted [facility] to be advised that pt was d/c to inpt rehab yesterday. Will provide pt's d/c date from rehab once known.   Randine Mungo Dialysis Navigator [phone]

## 2024-01-31 NOTE — Progress Notes (Signed)
 " [facility] Progress Note   Subjective:   Reports she is a little out of breath after moving around but tolerating PT, walking hallways this am. Denies CP, dizziness, nausea.   Objective Vitals:   01/30/24 1944 01/31/24 0500 01/31/24 0616 01/31/24 0701  BP: (!) 151/80  124/72   Pulse: (!) 106  84   Resp: 17  17   Temp: 100.1 F (37.8 C)  98.6 F (37 C)   TempSrc: Oral  Oral   SpO2: 100%  100%   Weight:    55.7 kg  Height:  5' 3 (1.6 m)     Physical Exam General: Well appearing, NAD.  Heart: RRR Lungs: CTA anteriorly Abdomen: soft Extremities: no LE edema Dialysis Access: LUE AVG +t/b  Additional Objective Labs: Basic Metabolic Panel: Recent Labs  Lab 01/26/24 0451 01/27/24 1152 01/30/24 1340  NA 130* 127* 132*  K 5.3* 5.6* 5.5*  CL 94* 92* 93*  CO2 28 22 28   GLUCOSE 87 82 103*  BUN 20 36* 45*  CREATININE 4.28* 6.27* 7.71*  CALCIUM  8.6* 9.1 8.9  PHOS  --   --  5.8*   Liver Function Tests: Recent Labs  Lab 01/30/24 1340  ALBUMIN  3.2*   No results for input(s): LIPASE, AMYLASE in the last 168 hours. CBC: Recent Labs  Lab 01/26/24 0451 01/30/24 1340  WBC 5.4 6.4  HGB 9.9* 9.4*  HCT 31.0* 29.8*  MCV 97.5 95.8  PLT 218 190   Blood Culture    Component Value Date/Time   SDES BLOOD RIGHT ARM 01/01/2024 1316   SDES BLOOD RIGHT ARM 01/01/2024 1316   SPECREQUEST  01/01/2024 1316    BOTTLES DRAWN AEROBIC ONLY Blood Culture adequate volume   SPECREQUEST  01/01/2024 1316    BOTTLES DRAWN AEROBIC ONLY Blood Culture adequate volume   CULT  01/01/2024 1316    NO GROWTH 5 DAYS Performed at [facility], [address], [place], [place] [address]    CULT  01/01/2024 1316    NO GROWTH 5 DAYS Performed at [facility], [address], [place], [place] [address]    REPTSTATUS 01/06/2024 FINAL 01/01/2024 1316   REPTSTATUS 01/06/2024 FINAL 01/01/2024 1316    Cardiac Enzymes: No results for input(s): CKTOTAL, CKMB,  CKMBINDEX, TROPONINI in the last 168 hours. CBG: No results for input(s): GLUCAP in the last 168 hours. Iron  Studies: No results for input(s): IRON , TIBC, TRANSFERRIN, FERRITIN in the last 72 hours. @lablastinr3 @ Studies/Results: No results found. Medications:   aspirin  EC  325 mg Oral Daily   benzonatate   200 mg Oral TID   chlorpheniramine-HYDROcodone   5 mL Oral Q12H   cholecalciferol   2,000 Units Oral Daily   docusate sodium   100 mg Oral BID   ferric citrate   420 mg Oral TID WC   guaiFENesin   600 mg Oral BID   metoprolol  succinate  25 mg Oral Daily   multivitamin  1 tablet Oral QHS   [START ON 02/01/2024] paricalcitol   4.5 mcg Intravenous Q T,Th,Sa-HD   zinc  sulfate (50mg  elemental zinc )  220 mg Oral QHS    Dialysis Orders: [facility] EDW 54 kg. 3.5 hours. 2K/2.5 calcium . AVG flow rates: 400/600. Heparin : None. Meds: EPO 6200 units every treatment, Venofer, Zemplar  4.5 mcg every treatment. Binders: Auryxia  3 tabs 3 times daily AC.     Assessment/Plan:  ESRD  - TTS schedule; tolerated HD on Tues with 1.4L UF but alarming of the machine. Not getting  heparin  as outpt. - Next HD Thur; walking the halls. - K high -> lokelma ; renal diet (hd 1st shift on Thur)   Closed femoral fracture - Fall while riding home from HD on bus - S/p surgery 1/16 - Ongoing PT/OT   Volume/ hypertension  -  BP decent, no edema on exam - Leaving under EDW here, will lower on discharge   Anemia of Chronic Kidney Disease - Hgb 9.9 - Allergy to Aranesp , transfuse prn   Secondary Hyperparathyroidism/Hyperphosphatemia - CorrCa ok, Phos high - continue Auryxia  as binder (coming down slowly)   Dispo - Appealing for CIR. SNF being considered as backup.  "

## 2024-01-31 NOTE — Progress Notes (Signed)
 Inpatient Rehabilitation Admission Medication Review by a Pharmacist  A complete drug regimen review was completed for this patient to identify any potential clinically significant medication issues.  High Risk Drug Classes Is patient taking? Indication by Medication  Antipsychotic No   Anticoagulant No   Antibiotic No   Opioid Yes Tussionex- cough Norco- acute pain  Antiplatelet Yes Aspirin - cva ppx  Hypoglycemics/insulin  No   Vasoactive Medication Yes Toprol - HTN/HFrEF  Chemotherapy No   Other Yes Auryxia - hyperphos Robaxin - muscle spasms Tessalon : cough Zemplar - CKD, secondary hyperparathyroid Lokelma - hyperkalemia (once)     Type of Medication Issue Identified Description of Issue Recommendation(s)  Drug Interaction(s) (clinically significant)     Duplicate Therapy     Allergy     No Medication Administration End Date     Incorrect Dose     Additional Drug Therapy Needed     Significant med changes from prior encounter (inform family/care partners about these prior to discharge).    Other       Clinically significant medication issues were identified that warrant physician communication and completion of prescribed/recommended actions by midnight of the next day:  No   Time spent performing this drug regimen review (minutes):  30 Thank you for allowing pharmacy to be a part of this patients care.   Bascom JAYSON Louder, PharmD 01/31/2024 11:50 AM  **Pharmacist phone directory can be found on [email or web address] listed under [facility]**

## 2024-01-31 NOTE — Patient Care Conference (Incomplete)
 Inpatient RehabilitationTeam Conference and Plan of Care Update Date: 01/31/2024   Time: 12:05 AM    Patient Name: Barbara Crawford      Medical Record Number: [id number]  Date of Birth: 1955/07/29 Sex: Female         Room/Bed: 4W18C/4W18C-01 Payor Info: Payor: BLUE CROSS BLUE [employer]IELD MEDICARE / Plan: BCBS MEDICARE / Product Type: *No Product type* /    Admit Date/Time:  01/30/2024  6:34 PM  Primary Diagnosis:  Displaced fracture of left femoral neck [facility])  Hospital Problems: Principal Problem:   Displaced fracture of left femoral neck (HCC)    Expected Discharge Date:    Team Members Present:       Current Status/Progress Goal Weekly Team Focus  Bowel/Bladder      ***          Swallow/Nutrition/ Hydration      ***         ADL's      ***          Mobility      ***         Communication      ***          Safety/Cognition/ Behavioral Observations     ***          Pain      ***          Skin      ***           Discharge Planning:      Team Discussion: *** Patient on target to meet rehab goals: {IP REHAB YES/NO WITH TPOIRJMID:75863}  *See Care Plan and progress notes for long and short-term goals.   Revisions to Treatment Plan:  ***  Teaching Needs: ***  Current Barriers to Discharge: {BARRIERS TO [id number]:75864}  Possible Resolutions to Barriers: ***     Medical Summary               I attest that I was present, lead the team conference, and concur with the assessment and plan of the team.   Damien Pepper 01/31/2024, 12:05 AM

## 2024-01-31 NOTE — Progress Notes (Signed)
 Inpatient Rehabilitation  Patient information reviewed and entered into eRehab system by [facility]. Karen Kays., CCC/SLP, PPS Coordinator.  Information including medical coding, functional ability and quality indicators will be reviewed and updated through discharge.
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
## 2024-01-31 NOTE — Progress Notes (Addendum)
 "   Signed      PMR Admission Coordinator Pre-Admission Assessment   Patient: Barbara Crawford is an 69 y.o., female MRN: [id number] DOB: 1955-07-12 Height: 5' 3 (160 cm) Weight: 50.4 kg   Insurance Information HMO: yes    PPO:      PCP:      IPA:      80/20:     OTHER:  PRIMARY: BCBS Medicare      Policy#: [id number]      Subscriber: Pt CM Name: [name]     Phone#:  [phone]    Fax#:  [phone] Pre-Cert#: [id number]  I received auth for CIR from  appeals department  at [facility]  for admit 01/29/24 through 01/31/24.  Updates due to [facility] at fax listed above.     Employer:  Benefits:  Phone #:      Name:  Eff Date: 01/04/2024 - 01/02/2198 Deductible: does not have one OOP Max: $4,900 ($0 met)   CIR: $400 co-pay day for days 1-6, $0 copay for days 7-90 SNF: $0.00 Copayment per day for days 1-20; $218 Copayment per day for days 21-60; $0.00 copayment for days 61-100 Maximum of 100 days/benefit period Outpatient:  $15 copay/visit Home Health:  100% coverage DME: 80% coverage, 20% co-insurance Providers: in network  SECONDARY:       Policy#:      Phone#:      Artist:       Phone#:    [employer] for patients in Inpatient Rehabilitation Facilities with attached Privacy Act Statement-Health Care Records was provided and verbally reviewed with: Pt   Emergency Contact Information Contact Information       Name Relation Home Work Mobile    [name] Daughter     [phone]    [name]     [phone]    [name] [phone]   [phone]         Other Contacts       Name Relation Home Work Mobile    Scott,James Jerona Blades     [phone]           Current Medical History  Patient Admitting Diagnosis: Femur fx History of Present Illness: Barbara Crawford is a 69 y.o. female with medical history significant of ESRD on dialysis TTS schedule, chronic hypoxic respiratory  failure 2 L oxygen  at baseline, HFrEF 30 to 35%, CAD, essential hypertension, generalized anxiety disorder and chronic dyspnea presented to [facility] emergency department  on 01/18/24 complaining of left-sided leg pain and hip pain after falling from vehicle returning from dialysis.At presentation to ED patient was  hemodynamically stable.  X-ray femur showed acute impacted subcapital left femoral neck fracture.X-ray pelvis showed  Impacted subcapital left femoral neck fracture.and degenerative changes of the right hip.Underwent percutaneous fixation left femoral neck 1/16 (Dr Kendal). She was seen by PT/OT and they recommended CIR to assist return to PLOF.          Patient's medical record from [facility]  has been reviewed by the rehabilitation admission coordinator and physician.   Past Medical History         Past Medical History:  Diagnosis Date   Anxiety     ESRD (end stage renal disease) on dialysis (HCC)     Heart failure with mildly reduced ejection fraction (HFmrEF) (HCC)     Hypertension  Has the patient had major surgery during 100 days prior to admission? Yes   Family History   family history includes Dementia in her mother; Diabetes in her sister; Hypertension in her son.   Current Medications [Current Medications]  [Current Medications]   Current Facility-Administered Medications:    acetaminophen  (TYLENOL ) tablet 325-650 mg, 325-650 mg, Oral, Q6H PRN, Danton Lauraine LABOR, PA-C, 325 mg at 01/23/24 2035   aspirin  EC tablet 325 mg, 325 mg, Oral, Daily, Danton Lauraine LABOR, PA-C, 325 mg at 01/24/24 9058   benzonatate  (TESSALON ) capsule 200 mg, 200 mg, Oral, TID, Danton Lauraine LABOR, PA-C, 200 mg at 01/19/24 1508   chlorpheniramine-HYDROcodone  (TUSSIONEX) 10-8 MG/5ML suspension 5 mL, 5 mL, Oral, Q12H, Rai, Ripudeep K, MD   cholecalciferol  (VITAMIN D3) 25 MCG (1000 UNIT) tablet 2,000 Units, 2,000 Units, Oral, Daily, Danton Lauraine LABOR,  PA-C, 2,000 Units at 01/24/24 0941   diphenhydrAMINE  (BENADRYL ) 12.5 MG/5ML elixir 12.5-25 mg, 12.5-25 mg, Oral, Q4H PRN, Danton Lauraine LABOR, PA-C   docusate sodium  (COLACE) capsule 100 mg, 100 mg, Oral, BID, Danton Lauraine LABOR, PA-C, 100 mg at 01/24/24 9057   ferric citrate  (AURYXIA ) tablet 210 mg, 210 mg, Oral, PRN, Danton Lauraine LABOR, PA-C   ferric citrate  (AURYXIA ) tablet 420 mg, 420 mg, Oral, TID WC, McClung, Sarah A, PA-C   guaiFENesin  (MUCINEX ) 12 hr tablet 600 mg, 600 mg, Oral, BID, McClung, Sarah A, PA-C   HYDROcodone -acetaminophen  (NORCO/VICODIN) 5-325 MG per tablet 1-2 tablet, 1-2 tablet, Oral, Q6H PRN, McClung, Sarah A, PA-C   HYDROmorphone  (DILAUDID ) injection 0.5 mg, 0.5 mg, Intravenous, Q3H PRN, Danton Lauraine LABOR, PA-C   methocarbamol  (ROBAXIN ) injection 500 mg, 500 mg, Intravenous, Q6H PRN, McClung, Sarah A, PA-C   metoCLOPramide  (REGLAN ) tablet 5-10 mg, 5-10 mg, Oral, Q8H PRN **OR** metoCLOPramide  (REGLAN ) injection 5-10 mg, 5-10 mg, Intravenous, Q8H PRN, McClung, Sarah A, PA-C   metoprolol  succinate (TOPROL -XL) 24 hr tablet 25 mg, 25 mg, Oral, Daily, Danton Lauraine LABOR, PA-C, 25 mg at 01/24/24 0942   multivitamin (RENA-VIT) tablet 1 tablet, 1 tablet, Oral, QHS, Danton Lauraine LABOR, PA-C, 1 tablet at 01/23/24 2036   ondansetron  (ZOFRAN ) tablet 4 mg, 4 mg, Oral, Q6H PRN **OR** ondansetron  (ZOFRAN ) injection 4 mg, 4 mg, Intravenous, Q6H PRN, McClung, Sarah A, PA-C   paricalcitol  (ZEMPLAR ) injection 4.5 mcg, 4.5 mcg, Intravenous, Q T,Th,Sa-HD, Dennise Hoes, MD, 4.5 mcg at 01/20/24 1240   polyethylene glycol (MIRALAX  / GLYCOLAX ) packet 17 g, 17 g, Oral, Daily PRN, Danton Lauraine LABOR, PA-C, 17 g at 01/24/24 0217   sodium chloride  flush (NS) 0.9 % injection 3 mL, 3 mL, Intravenous, Q12H, McClung, Sarah A, PA-C, 3 mL at 01/24/24 9056   sodium chloride  flush (NS) 0.9 % injection 3 mL, 3 mL, Intravenous, Q12H, McClung, Sarah A, PA-C, 3 mL at 01/24/24 9056   sodium chloride  flush (NS) 0.9 % injection 3 mL,  3 mL, Intravenous, PRN, Danton, Sarah A, PA-C   Patients Current Diet:  Diet Order                  Diet Heart Room service appropriate? Yes; Fluid consistency: Thin  Diet effective now                         Precautions / Restrictions Precautions Precautions: Fall Restrictions Weight Bearing Restrictions Per Provider Order: Yes LLE Weight Bearing Per Provider Order: Weight bearing as tolerated    Has the patient had 2 or more falls or  a fall with injury in the past year? Yes   Prior Activity Level Community (5-7x/wk): Pt. active in the community PTA   Prior Functional Level Self Care: Did the patient need help bathing, dressing, using the toilet or eating? Independent   Indoor Mobility: Did the patient need assistance with walking from room to room (with or without device)? Independent   Stairs: Did the patient need assistance with internal or external stairs (with or without device)? Independent   Functional Cognition: Did the patient need help planning regular tasks such as shopping or remembering to take medications? Independent   Patient Information Are you of Hispanic, Latino/a,or Spanish origin?: A. No, not of Hispanic, Latino/a, or Spanish origin What is your race?: B. Black or African American Do you need or want an interpreter to communicate with a doctor or health care staff?: 0. No   Patient's Response To:  Health Literacy and Transportation Is the patient able to respond to health literacy and transportation needs?: Yes Health Literacy - How often do you need to have someone help you when you read instructions, pamphlets, or other written material from your doctor or pharmacy?: Never In the past 12 months, has lack of transportation kept you from medical appointments or from getting medications?: No In the past 12 months, has lack of transportation kept you from meetings, work, or from getting things needed for daily living?: No   Home Assistive Devices  / Equipment Home Equipment: [employer] (2 wheels), [employer] - quad, BSC/3in1   Prior Device Use: Indicate devices/aids used by the patient prior to current illness, exacerbation or injury? None of the above   Current Functional Level Cognition   Orientation Level: Oriented X4    Extremity Assessment (includes Sensation/Coordination)   Upper Extremity Assessment: Defer to OT evaluation  Lower Extremity Assessment: LLE deficits/detail LLE Deficits / Details: L femur fx     ADLs   Overall ADL's : Needs assistance/impaired Eating/Feeding: Set up, Sitting Grooming: Set up, Sitting Upper Body Bathing: Minimal assistance, Sitting Lower Body Bathing: Maximal assistance Upper Body Dressing : Set up, Sitting Lower Body Dressing: Maximal assistance Toilet Transfer: Minimal assistance Toileting- Clothing Manipulation and Hygiene: Minimal assistance     Mobility   Overal bed mobility: Needs Assistance Bed Mobility: Supine to Sit, Sit to Supine Supine to sit: Mod assist, HOB elevated Sit to supine: Min assist General bed mobility comments: Mod A for management of LLE; practiced with use of belt around L foot to incr pt independence with task     Transfers   Overall transfer level: Needs assistance Equipment used: Rolling walker (2 wheels) Transfers: Sit to/from Stand Sit to Stand: Min assist Bed to/from chair/wheelchair/BSC transfer type:: Step pivot Step pivot transfers: Min assist, +2 physical assistance, +2 safety/equipment General transfer comment: STS from lowest bed surface with RW, minA +1. cues for setup/sequencing. Able to perform step pivot into recliner w/ min cues for sequencing and RW management, reduced eccentric control but able to mitigate with UE support on armrest. additional STS from recliner with CGA and RW     Ambulation / Gait / Stairs / Wheelchair Mobility   Ambulation/Gait Ambulation/Gait assistance: Contact guard assist Gait Distance (Feet): 30 Feet Assistive  device: Rolling walker (2 wheels) Gait Pattern/deviations: Step-to pattern, Narrow base of support, Trunk flexed General Gait Details: cues for posture/pacing, RW management in close environment, inc time/effort.     Posture / Balance Dynamic Sitting Balance Sitting balance - Comments: received sitting EOB without UE  support, working on lunch. no apparent instability. Balance Overall balance assessment: Needs assistance Sitting-balance support: No upper extremity supported, Feet supported Sitting balance-Leahy Scale: Good Sitting balance - Comments: received sitting EOB without UE support, working on lunch. no apparent instability. Standing balance support: No upper extremity supported, During functional activity Standing balance-Leahy Scale: Good Standing balance comment: use of RW for safety/comfort, but is able to wash hands at sink without reliance on device. also able to reach to dry hands without instability/LOB. CGA provided for safety     Special considerations/life events  Dialysis: Hemodialysis Tuesday, Thursday, and Saturday    Previous Home Environment (from acute therapy documentation) Living Arrangements: Children  Lives With: Spouse Available Help at Discharge: Family, Available PRN/intermittently Type of Home: Apartment Home Layout: One level Home Access: Stairs to enter Entrance Stairs-Rails: Left Entrance Stairs-Number of Steps: flight Bathroom Shower/Tub: Health Visitor: Standard Home Care Services: No Additional Comments: Pulled from previous admission 3 weeks ago.   Discharge Living Setting Plans for Discharge Living Setting: Patient's home Type of Home at Discharge: House Discharge Home Layout: One level Discharge Home Access: Stairs to enter Entrance Stairs-Rails: None Entrance Stairs-Number of Steps: 15 Discharge Bathroom Shower/Tub: Walk-in shower Discharge Bathroom Toilet: Standard Discharge Bathroom Accessibility: Yes How Accessible:  Accessible via walker, Accessible via wheelchair Does the patient have any problems obtaining your medications?: No   Social/Family/Support Systems Patient Roles: Other (Comment) Contact Information: [phone] Anticipated Caregiver: Daughter can take FMLA Caregiver Availability: 24/7 Discharge Plan Discussed with Primary Caregiver: Yes Is Caregiver In Agreement with Plan?: Yes Does Caregiver/Family have Issues with Lodging/Transportation while Pt is in Rehab?: No   Goals Patient/Family Goal for Rehab: PT/OT Supervision Expected length of stay: 12-14 days Pt/Family Agrees to Admission and willing to participate: Yes Program Orientation Provided & Reviewed with Pt/Caregiver Including Roles  & Responsibilities: Yes   Decrease burden of Care through IP rehab admission: Not anticipated   Possible need for SNF placement upon discharge: not anticipated   Patient Condition: I have reviewed medical records from [facility], spoken with CM, and patient and daughter. I met with patient at the bedside for inpatient rehabilitation assessment.  Patient will benefit from ongoing PT and OT, can actively participate in 3 hours of therapy a day 5 days of the week, and can make measurable gains during the admission.  Patient will also benefit from the coordinated team approach during an Inpatient Acute Rehabilitation admission.  The patient will receive intensive therapy as well as Rehabilitation physician, nursing, social worker, and care management interventions.  Due to safety, skin/wound care, disease management, medication administration, pain management, and patient education the patient requires 24 hour a day rehabilitation nursing.  The patient is currently Min a To CGA with mobility and basic ADLs.  Discharge setting and therapy post discharge at home with home health is anticipated.  Patient has agreed to participate in the Acute Inpatient Rehabilitation Program and will admit  today.   Preadmission Screen Completed By:  Leita KATHEE Kleine, 01/24/2024 1:18 PM ______________________________________________________________________   Discussed status with Dr. Cornelio on 01/30/24 at 900 and received approval for admission today.   Admission Coordinator:  Leita KATHEE Kleine, CCC-SLP, time 1256/Date 01/30/24    Assessment/Plan: Diagnosis: L femoral neck fx s/p perc fixation Does the need for close, 24 hr/day Medical supervision in concert with the patient's rehab needs make it unreasonable for this patient to be served in a less intensive setting? Yes Co-Morbidities requiring supervision/potential complications:  ESRD on T/H/S, chronic resp failure on home O2 2L by [place]; sCHF, HTN, anxiety,  Due to bowel management, safety, skin/wound care, disease management, medication administration, pain management, and patient education, does the patient require 24 hr/day rehab nursing? Yes Does the patient require coordinated care of a physician, rehab nurse, PT, OT, and SLP to address physical and functional deficits in the context of the above medical diagnosis(es)? Yes Addressing deficits in the following areas: balance, endurance, locomotion, strength, transferring, bowel/bladder control, bathing, dressing, feeding, grooming, and toileting Can the patient actively participate in an intensive therapy program of at least 3 hrs of therapy 5 days a week? Yes The potential for patient to make measurable gains while on inpatient rehab is good Anticipated functional outcomes upon discharge from inpatient rehab: supervision PT, supervision OT, n/a SLP Estimated rehab length of stay to reach the above functional goals is: 12-14 days Anticipated discharge destination: Home 10. Overall Rehab/Functional Prognosis: good     MD Signature:              Revision History  Date/Time User Provider Type Action  01/30/2024  2:31 PM Cornelio Bouchard, MD Physician Sign  01/30/2024 12:56 PM Cecilie Leita NOVAK,  CCC-SLP Rehab Admission Coordinator Share  01/30/2024 11:53 AM Cecilie Leita NOVAK, CCC-SLP Rehab Admission Coordinator Share  01/24/2024  3:46 PM Cecilie Leita NOVAK RAEJEAN Rehab Admission Coordinator Share  01/24/2024  1:23 PM Cecilie Leita NOVAK RAEJEAN Rehab Admission Coordinator                 "
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
## 2024-01-31 NOTE — Progress Notes (Signed)
 Occupational Therapy Session Note  Patient Details  Name: [name] MRN: [id number] Date of Birth: 1955-04-03  Today's Date: 01/31/2024 OT Individual Time: 1402-1501 OT Individual Time Calculation (min): 59 min    Short Term Goals: Week 1:  OT Short Term Goal 1 (Week 1): STG=LTG d.t elos  Skilled Therapeutic Interventions/Progress Updates:   Patient agreeable to participate in OT session. Reports 0/10 pain level.   Patient participated in skilled OT session focusing on dynamic balance, mood enhancement, stairs, and UE strengthening. Patient received in wc in room ready for OT. Patient then transferred to social group for mood enhancement with hot chocolate. Patient verbalized positive benefit and increase in encouragement following. Patient then educated on progression of stairs with safety for home mobility. Patient completed stair training with min A to CGA with increased balance with OT verbal cueing on sequencing with increased time. Patient required functional rest following. Patient then educated on patient functional status and progression due to questions had. Patient then completed UE strengthening circuit to improve ROM in LUE and ability to WB through UE. Patient completed tricep extensions horizontal abduction red theraband 2x12 with good skill. Patient completed chest press 3x12 with 2# dowel rod. Patient then returned to room all needs in reach via total wc transfer.    Therapy Documentation Precautions:  Precautions Precautions: Fall Recall of Precautions/Restrictions: Intact Precaution/Restrictions Comments: reminders to elevate LLE Restrictions Weight Bearing Restrictions Per Provider Order: Yes LLE Weight Bearing Per Provider Order: Weight bearing as tolerated     Therapy/Group: Individual Therapy  D'mariea L Tan Clopper 01/31/2024, 3:44 PM

## 2024-01-31 NOTE — Progress Notes (Signed)
 "                                                        PROGRESS NOTE   Subjective/Complaints: Patient reports she feels well this morning, states her mood is improved and she understands she is here to get stronger and if 1 step closer to going home.  Has had occasional elevated temperature, Tmax of 100.1 yesterday evening.  She denies any dysuria.  Reports her breathing is around baseline.  She does get shortness of breath with exertion, however no worse than her usual.  Denies frequent cough.  Denies nasal congestion.  Pain is under control.    LBM today  ROS: Patient denies new vision changes, dizziness, nausea, vomiting, diarrhea,  shortness of breath or chest pain, headache, or mood change. + low grade fever    Objective:   No results found. Recent Labs    01/30/24 1340  WBC 6.4  HGB 9.4*  HCT 29.8*  PLT 190   Recent Labs    01/30/24 1340  NA 132*  K 5.5*  CL 93*  CO2 28  GLUCOSE 103*  BUN 45*  CREATININE 7.71*  CALCIUM  8.9    Intake/Output Summary (Last 24 hours) at 01/31/2024 1227 Last data filed at 01/31/2024 0752 Gross per 24 hour  Intake 120 ml  Output --  Net 120 ml        Physical Exam: Vital Signs Blood pressure 124/72, pulse 84, temperature 98.6 F (37 C), temperature source Oral, resp. rate 17, height 5' 3 (1.6 m), weight 55.7 kg, SpO2 100%.  General: No apparent distress HEENT: Head is normocephalic, atraumatic, sclera anicteric, oral mucosa a little dry Neck: Supple without JVD or lymphadenopathy Heart: Reg rate and rhythm. No murmurs rubs or gallops Chest: CTA bilaterally without wheezes, rales, or rhonchi; no distress, 2L [place] Abdomen: Soft, non-tender, non-distended, bowel sounds positive. Extremities: LUE upper arm fistula , no LE edema noted Psych: Pt's affect is appropriate. 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 Functional deficits which require 3+ hours per day of interdisciplinary therapy in a comprehensive inpatient rehab setting. Physiatrist is providing close team supervision and 24 hour management of active medical problems listed below. Physiatrist and rehab team continue to assess barriers to discharge/monitor patient progress toward functional and medical goals  Care Tool:  Bathing    Body parts bathed by patient: Right arm, Left arm, Chest, Left lower leg, Abdomen, Front perineal area, Face, Buttocks, Right upper leg, Left upper leg, Right lower leg         Bathing assist Assist Level: Minimal Assistance - Patient > 75%     Upper Body Dressing/Undressing Upper body dressing   What is the patient wearing?: Pull over shirt    Upper body assist Assist Level: Supervision/Verbal cueing  Lower Body Dressing/Undressing Lower body dressing      What is the patient wearing?: Underwear/pull up, Pants     Lower body assist Assist for lower body dressing: Minimal Assistance - Patient > 75%     Toileting Toileting    Toileting assist Assist for toileting: Minimal Assistance - Patient > 75%     Transfers Chair/bed transfer  Transfers assist     Chair/bed transfer assist level: Minimal Assistance - Patient > 75%     Locomotion Ambulation   Ambulation assist              Walk 10 feet activity   Assist           Walk 50 feet activity   Assist           Walk 150 feet activity   Assist           Walk 10 feet on uneven surface  activity   Assist           Wheelchair     Assist                Wheelchair 50 feet with 2 turns activity    Assist            Wheelchair 150 feet activity     Assist          Blood pressure 124/72, pulse 84, temperature 98.6 F (37 C), temperature source Oral, resp. rate 17, height 5' 3 (1.6 m), weight 55.7 kg, SpO2 100%.  Medical Problem List and Plan: 1. Functional deficits secondary to  L femoral neck fx s/p Dr Kendal per fixation 01/19/24             -Per Ortho (1/19)patient may shower and can get incision wet, do not submerge.              -ELOS/Goals: 12-14 days- supervision            -Continue CIR  -Team conference today please see physician documentation under team conference tab, met with team  to discuss problems,progress, and goals. Formulized individual treatment plan based on medical history, underlying problem and comorbidities.     2.  Antithrombotics: -DVT/anticoagulation:  Mechanical:  Antiembolism stockings, knee (TED hose) Bilateral lower extremities Sequential compression devices, below knee Bilateral lower extremities             -antiplatelet therapy: Aspirin              - Vas US  Doppler pending 3. Pain Management: As needed Vicodin and Tylenol  4. Mood/Behavior/Sleep: LCSW to follow for evaluation and support when available.              -antipsychotic agents: N/A 5. Neuropsych/cognition: This patient is capable of making decisions on her own behalf. 6. Skin/Wound Care: Routine pressure relief measures.  Change incisional dressing as needed. ?Staple remove at 2 weeks- sounds like 1/31?.  7. Fluids/Electrolytes/Nutrition: Monitor strict I&O and weight. Follow up labs per HD. - Moderate malnutrition: BMI 20.35 kg/m  8.  Close left femoral fracture: S/p percutaneous fixation of left femoral neck on 1/16.  Outpatient follow-up with Dr. Kendal in 2 weeks.             - WBAT LLE, continue PT. 9.  ESRD: On hemodialysis, TTS. Oliguric.  Continue HD per schedule.   -Mild hyperkalemia.  Continue binders and  Auryxia . Monitor volume status and labs.     -1/28 reviewed nephrology note, Lokelma  ordered today  for hyperkalemia. 10.  Essential HTN/chronic HFrEF: Euvolemic, BP stable.  Continue Toprol  25 mg daily. 11.  Chronic hypoxic respiratory failure: Currently stable on 2 L O2 via nasal cannula (baseline) 12.  Cough: r/t recent pneumonia.  Continue Mucinex  twice daily, Tessalon  and Tussionex suspension.  Encourage incentive spirometer.  - 1/20 reports this has improved continue to monitor 13.  Vitamin D  deficiency: Continue vitamin D3 Supp. 14.  Low-grade temp: 100.5 degrees, may need to repeat respiratory viral panel.   -1/28 yesterday Temperature 100.1.  No signs or symptoms of infection noted.  WBC not elevated.  Continue to monitor 15.  CAD: Continue Toprol -XL and aspirin . 16. Anemia of CKD  -1/28 HGB 9.4, continue to monitor    LOS: 1 days A FACE TO FACE EVALUATION WAS PERFORMED  Murray Collier 01/31/2024, 12:27 PM     "

## 2024-01-31 NOTE — Plan of Care (Signed)
" °  Problem: RH Balance Goal: LTG Patient will maintain dynamic standing with ADLs (OT) Description: LTG:  Patient will maintain dynamic standing balance with assist during activities of daily living (OT)  Flowsheets (Taken 01/31/2024 1253) LTG: Pt will maintain dynamic standing balance during ADLs with: Supervision/Verbal cueing   Problem: Sit to Stand Goal: LTG:  Patient will perform sit to stand in prep for activites of daily living with assistance level (OT) Description: LTG:  Patient will perform sit to stand in prep for activites of daily living with assistance level (OT) Flowsheets (Taken 01/31/2024 1253) LTG: PT will perform sit to stand in prep for activites of daily living with assistance level: Independent with assistive device   Problem: RH Grooming Goal: LTG Patient will perform grooming w/assist,cues/equip (OT) Description: LTG: Patient will perform grooming with assist, with/without cues using equipment (OT) Flowsheets (Taken 01/31/2024 1253) LTG: Pt will perform grooming with assistance level of: Independent with assistive device    Problem: RH Bathing Goal: LTG Patient will bathe all body parts with assist levels (OT) Description: LTG: Patient will bathe all body parts with assist levels (OT) Flowsheets (Taken 01/31/2024 1253) LTG: Pt will perform bathing with assistance level/cueing: Supervision/Verbal cueing   Problem: RH Dressing Goal: LTG Patient will perform upper body dressing (OT) Description: LTG Patient will perform upper body dressing with assist, with/without cues (OT). Flowsheets (Taken 01/31/2024 1253) LTG: Pt will perform upper body dressing with assistance level of: Independent with assistive device Goal: LTG Patient will perform lower body dressing w/assist (OT) Description: LTG: Patient will perform lower body dressing with assist, with/without cues in positioning using equipment (OT) Flowsheets (Taken 01/31/2024 1253) LTG: Pt will perform lower body dressing  with assistance level of: Independent with assistive device   Problem: RH Toileting Goal: LTG Patient will perform toileting task (3/3 steps) with assistance level (OT) Description: LTG: Patient will perform toileting task (3/3 steps) with assistance level (OT)  Flowsheets (Taken 01/31/2024 1253) LTG: Pt will perform toileting task (3/3 steps) with assistance level: Independent with assistive device   Problem: RH Light Housekeeping Goal: LTG Patient will perform light housekeeping w/assist (OT) Description: LTG: Patient will perform light housekeeping with assistance, with/without cues (OT). Flowsheets (Taken 01/31/2024 1253) LTG: Pt will perform light housekeeping with assistance level of: Supervision/Verbal cueing   Problem: RH Toilet Transfers Goal: LTG Patient will perform toilet transfers w/assist (OT) Description: LTG: Patient will perform toilet transfers with assist, with/without cues using equipment (OT) Flowsheets (Taken 01/31/2024 1253) LTG: Pt will perform toilet transfers with assistance level of: Independent with assistive device   Problem: RH Tub/Shower Transfers Goal: LTG Patient will perform tub/shower transfers w/assist (OT) Description: LTG: Patient will perform tub/shower transfers with assist, with/without cues using equipment (OT) Flowsheets (Taken 01/31/2024 1253) LTG: Pt will perform tub/shower stall transfers with assistance level of: Independent with assistive device   "

## 2024-01-31 NOTE — Plan of Care (Signed)
" °  Problem: RH Balance Goal: LTG Patient will maintain dynamic standing balance (PT) Description: LTG:  Patient will maintain dynamic standing balance with assistance during mobility activities (PT) Flowsheets (Taken 01/31/2024 1241) LTG: Pt will maintain dynamic standing balance during mobility activities with:: Independent with assistive device    Problem: Sit to Stand Goal: LTG:  Patient will perform sit to stand with assistance level (PT) Description: LTG:  Patient will perform sit to stand with assistance level (PT) Flowsheets (Taken 01/31/2024 1241) LTG: PT will perform sit to stand in preparation for functional mobility with assistance level: Independent with assistive device   Problem: RH Bed Mobility Goal: LTG Patient will perform bed mobility with assist (PT) Description: LTG: Patient will perform bed mobility with assistance, with/without cues (PT). Flowsheets (Taken 01/31/2024 1241) LTG: Pt will perform bed mobility with assistance level of: Independent with assistive device    Problem: RH Bed to Chair Transfers Goal: LTG Patient will perform bed/chair transfers w/assist (PT) Description: LTG: Patient will perform bed to chair transfers with assistance (PT). Flowsheets (Taken 01/31/2024 1241) LTG: Pt will perform Bed to Chair Transfers with assistance level: Independent with assistive device    Problem: RH Car Transfers Goal: LTG Patient will perform car transfers with assist (PT) Description: LTG: Patient will perform car transfers with assistance (PT). Flowsheets (Taken 01/31/2024 1241) LTG: Pt will perform car transfers with assist:: Supervision/Verbal cueing   Problem: RH Ambulation Goal: LTG Patient will ambulate in controlled environment (PT) Description: LTG: Patient will ambulate in a controlled environment, # of feet with assistance (PT). Flowsheets (Taken 01/31/2024 1241) LTG: Pt will ambulate in controlled environ  assist needed:: Supervision/Verbal cueing LTG:  Ambulation distance in controlled environment: 100 ft using LRAD Goal: LTG Patient will ambulate in home environment (PT) Description: LTG: Patient will ambulate in home environment, # of feet with assistance (PT). Flowsheets (Taken 01/31/2024 1241) LTG: Pt will ambulate in home environ  assist needed:: Supervision/Verbal cueing LTG: Ambulation distance in home environment: 50 ft using LRAD   Problem: RH Stairs Goal: LTG Patient will ambulate up and down stairs w/assist (PT) Description: LTG: Patient will ambulate up and down # of stairs with assistance (PT) Flowsheets (Taken 01/31/2024 1241) LTG: Pt will ambulate up/down stairs assist needed:: Contact Guard/Touching assist   "

## 2024-02-01 ENCOUNTER — Ambulatory Visit (HOSPITAL_COMMUNITY)

## 2024-02-01 DIAGNOSIS — M7989 Other specified soft tissue disorders: Secondary | ICD-10-CM | POA: Insufficient documentation

## 2024-02-01 LAB — HEPATITIS B SURFACE ANTIBODY, QUANTITATIVE: Hep B S AB Quant (Post): <3.5 m[IU]/mL — ABNORMAL LOW

## 2024-02-01 MED ORDER — SUCROFERRIC OXYHYDROXIDE 500 MG PO CHEW
1000.0000 mg | CHEWABLE_TABLET | Freq: Three times a day (TID) | ORAL | Status: DC
  Filled 2024-02-01 (×9): qty 2

## 2024-02-01 NOTE — Plan of Care (Signed)
" °  Problem: Consults Goal: RH GENERAL PATIENT EDUCATION Description: See Patient Education module for education specifics. Outcome: Progressing   Problem: RH BOWEL ELIMINATION Goal: RH STG MANAGE BOWEL WITH ASSISTANCE Description: STG Manage Bowel with mod I Assistance. Outcome: Progressing Goal: RH STG MANAGE BOWEL W/MEDICATION W/ASSISTANCE Description: STG Manage Bowel with Medication with mod I Assistance. Outcome: Progressing   Problem: RH SAFETY Goal: RH STG ADHERE TO SAFETY PRECAUTIONS W/ASSISTANCE/DEVICE Description: STG Adhere to Safety Precautions With cues Assistance/Device. Outcome: Progressing   Problem: RH PAIN MANAGEMENT Goal: RH STG PAIN MANAGED AT OR BELOW PT'S PAIN GOAL Description: Pain < 4 with prns Outcome: Progressing   Problem: RH KNOWLEDGE DEFICIT GENERAL Goal: RH STG INCREASE KNOWLEDGE OF SELF CARE AFTER HOSPITALIZATION Description: Patient will be able to manage care at discharge using educational resources for medications and safety independently Outcome: Progressing   "

## 2024-02-01 NOTE — Progress Notes (Signed)
 Occupational Therapy Session Note  Patient Details  Name: Geovana Gebel MRN: [id number] Date of Birth: 1955/01/05  Today's Date: 02/01/2024 OT Individual Time: 9069-8954 OT Individual Time Calculation (min): 75 min    Short Term Goals: Week 1:  OT Short Term Goal 1 (Week 1): STG=LTG d.t elos  Skilled Therapeutic Interventions/Progress Updates:    Skilled OT intervention with focus on functional amb with RW, bathing at shower level, dressing with sit<>stand, standing balance, DME/AE education, safety awareness, and discharge planning to incrase pt's independence with BADLs. All amb with RW at supervision level. Bathing with sit<>stand using grab bars and LHS with supervision. Pt stood at sink to brush teeth before returning to w/c to complete dressing. Pt required assistance donning socks without AE but otherwise completed all other dressing tasks without assistance. Discussed TTB and AE for use at home. Pt provided info on DME and AE. Pt pleased with progress. Pt remained seated in wc/ with all needs within reach.   Therapy Documentation Precautions:  Precautions Precautions: Fall Recall of Precautions/Restrictions: Intact Precaution/Restrictions Comments: reminders to elevate LLE Restrictions Weight Bearing Restrictions Per Provider Order: Yes LLE Weight Bearing Per Provider Order: Weight bearing as tolerated   Pain: Pt c/o LLE pain with activity (unrated); rest as appropriate  Therapy/Group: Individual Therapy  Maritza Debby Mare 02/01/2024, 12:07 PM

## 2024-02-01 NOTE — Progress Notes (Signed)
 BLE venous duplex has been completed.   Results can be found under chart review under CV PROC. 02/01/2024 11:35 AM Kofi Murrell RVT, RDMS

## 2024-02-01 NOTE — Progress Notes (Signed)
 Physical Therapy Session Note  Patient Details  Name: Barbara Crawford MRN: [id number] Date of Birth: 04-28-1955  Today's Date: 02/01/2024 PT Individual Time: 0800-0900 PT Individual Time Calculation (min): 60 min   Today's Date: 02/01/2024 PT Individual Time: 1105-1200 PT Individual Time Calculation (min): 55 min   Short Term Goals: Week 1:  PT Short Term Goal 1 (Week 1): STG=LTG due to ELOS  Skilled Therapeutic Interventions/Progress Updates:     Treatment 1: Pt seated EOB upon arrival. Pt denies pain. Pt reports a restless night with some nightmares and anxiety but states this is baseline. Agreeable to therapy. Session emphasized functional strengthening and endurance/activity tolerance with ambulation. Pt performed sit to stands to RW throughout session with CGA. Pt performed stand pivot EOB to WC using RW with CGA and ++ time. Transported dependent in WC to main gym for time management. Pt amb 62 ft  and 86 ft using RW including 180 degree turns with CGA and ++ time. Seated rest break required between bouts due to L thigh discomfort. Gait very antalgic due to discomfort and anxiety when WB more through L LE. Once back in room, pt remained seated in WC, L LE elevated, and all needs in reach at end of session.   Treatment 2: Pt supine in bed having US  performed upon arrival. Pt missed 5 min at start of treatment due to US . Pt denies pain, endorses fatigue but agreeable to therapy. Session emphasized functional strengthening, endurance/activity tolerance and WB tolerance through L LE with gait and stair negotiation. Pt transported dependent in [facility] to main gym for time management. Pt practiced R toe taps on 6 inch step with B UE support for improved tolerance to standing on L LE with CGA and ++ time. Pt asc/desc 4-6 inch steps using B UE on L HR with CGA and ++ time. Afterwards pt very fatigued. Pt instructed in and performed 3x10 B heel/toe lifts, 3x10 B LAQ, and 3x10 alt seated marching.  Once back in room, pt transferred back to EOB where she remained seated with bed alarm on and all needs in reach at end of session.  Therapy Documentation Precautions:  Precautions Precautions: Fall Recall of Precautions/Restrictions: Intact Precaution/Restrictions Comments: reminders to elevate LLE Restrictions Weight Bearing Restrictions Per Provider Order: Yes LLE Weight Bearing Per Provider Order: Weight bearing as tolerated  Therapy/Group: Individual Therapy  [name], PT, DPT 02/01/2024, 7:43 AM

## 2024-02-01 NOTE — Progress Notes (Addendum)
 "                                                        PROGRESS NOTE   Subjective/Complaints: LBM yesterday.  Patient asking about iron  treatment, she mentions iron  pills ferric citrate ?  Large and hard to swallow.  She calls  horse pills.  She feels like she could swallow a smaller pill.  She mentions prior Venofer treatments. Pain under control.   LBM today  ROS: Patient denies new vision changes, dizziness, nausea, vomiting, diarrhea,  shortness of breath or chest pain, headache, or mood change. + low grade fever- improved    Objective:   VAS US  LOWER EXTREMITY VENOUS (DVT) Result Date: 02/01/2024  Lower Venous DVT Study Patient Name:  Barbara Crawford  Date of Exam:   02/01/2024 Medical Rec #: [id number]              Accession #:    [id number] Date of Birth: [birth year]             Patient Gender: F Patient Age:   69 years Exam Location:  [facility] Procedure:      VAS US  LOWER EXTREMITY VENOUS (DVT) Referring Phys: TORIBIO PITCH --------------------------------------------------------------------------------  Indications: Edema. Other Indications: Rehab patient. Risk Factors: Fall with femoral neck fracture on 01/18/2024 s/p surgical repair on 01/19/2024. Comparison Study: Previous exam on 03/07/2020 was negative for DVT Performing Technologist: Ezzie Potters RVT, RDMS  Examination Guidelines: A complete evaluation includes B-mode imaging, spectral Doppler, color Doppler, and power Doppler as needed of all accessible portions of each vessel. Bilateral testing is considered an integral part of a complete examination. Limited examinations for reoccurring indications may be performed as noted. The reflux portion of the exam is performed with the patient in reverse Trendelenburg.  +---------+---------------+---------+-----------+----------+--------------+ RIGHT    CompressibilityPhasicitySpontaneityPropertiesThrombus Aging  +---------+---------------+---------+-----------+----------+--------------+ CFV      Full           No       Yes                                 +---------+---------------+---------+-----------+----------+--------------+ SFJ      Full                                                        +---------+---------------+---------+-----------+----------+--------------+ FV Prox  Full           No       Yes                                 +---------+---------------+---------+-----------+----------+--------------+ FV Mid   Full           No       Yes                                 +---------+---------------+---------+-----------+----------+--------------+ FV DistalFull           No  Yes                                 +---------+---------------+---------+-----------+----------+--------------+ PFV      Full                                                        +---------+---------------+---------+-----------+----------+--------------+ POP      Full           No       Yes                                 +---------+---------------+---------+-----------+----------+--------------+ PTV      Full                                                        +---------+---------------+---------+-----------+----------+--------------+ PERO     Full                                                        +---------+---------------+---------+-----------+----------+--------------+   +---------+---------------+---------+-----------+----------+--------------+ LEFT     CompressibilityPhasicitySpontaneityPropertiesThrombus Aging +---------+---------------+---------+-----------+----------+--------------+ CFV      Full           Yes      Yes                                 +---------+---------------+---------+-----------+----------+--------------+ SFJ      Full                                                         +---------+---------------+---------+-----------+----------+--------------+ FV Prox  Full           Yes      Yes                                 +---------+---------------+---------+-----------+----------+--------------+ FV Mid   Full           Yes      Yes                                 +---------+---------------+---------+-----------+----------+--------------+ FV DistalFull           Yes      Yes                                 +---------+---------------+---------+-----------+----------+--------------+ PFV      Full                                                        +---------+---------------+---------+-----------+----------+--------------+  POP      Full           Yes      Yes                                 +---------+---------------+---------+-----------+----------+--------------+ PTV      Full                                                        +---------+---------------+---------+-----------+----------+--------------+ PERO     Full                                                        +---------+---------------+---------+-----------+----------+--------------+    Summary: BILATERAL: - No evidence of deep vein thrombosis seen in the lower extremities, bilaterally. -No evidence of popliteal cyst, bilaterally.   *See table(s) above for measurements and observations.    Preliminary    Recent Labs    01/30/24 1340  WBC 6.4  HGB 9.4*  HCT 29.8*  PLT 190   Recent Labs    01/30/24 1340  NA 132*  K 5.5*  CL 93*  CO2 28  GLUCOSE 103*  BUN 45*  CREATININE 7.71*  CALCIUM  8.9    Intake/Output Summary (Last 24 hours) at 02/01/2024 1141 Last data filed at 02/01/2024 0800 Gross per 24 hour  Intake 240 ml  Output --  Net 240 ml        Physical Exam: Vital Signs Blood pressure 130/64, pulse 87, temperature 98.5 F (36.9 C), temperature source Oral, resp. rate 18, height 5' 3 (1.6 m), weight 55.7 kg, SpO2 100%.  General: No apparent  distress, appears comfortable laying in bed HEENT: Head is normocephalic, atraumatic, sclera anicteric, oral mucosa a little dry Neck: Supple without JVD or lymphadenopathy Heart: Reg rate and rhythm. No murmurs rubs or gallops Chest: CTA bilaterally without wheezes, rales, or rhonchi; no distress, 2L [place] Abdomen: Soft, non-tender, non-distended, bowel sounds positive. 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 Functional deficits which require 3+ hours per day of interdisciplinary therapy in a comprehensive inpatient rehab setting. Physiatrist is providing close team supervision and 24 hour management of active medical problems listed below. Physiatrist and rehab team continue to assess barriers to discharge/monitor patient progress toward functional and medical goals  Care Tool:  Bathing    Body parts bathed by patient: Right arm, Left arm, Chest, Left lower leg, Abdomen, Front perineal area, Face, Buttocks, Right upper leg, Left upper leg, Right lower leg         Bathing assist  Assist Level: Minimal Assistance - Patient > 75%     Upper Body Dressing/Undressing Upper body dressing   What is the patient wearing?: Pull over shirt    Upper body assist Assist Level: Supervision/Verbal cueing    Lower Body Dressing/Undressing Lower body dressing      What is the patient wearing?: Underwear/pull up, Pants     Lower body assist Assist for lower body dressing: Minimal  Assistance - Patient > 75%     Toileting Toileting    Toileting assist Assist for toileting: Minimal Assistance - Patient > 75%     Transfers Chair/bed transfer  Transfers assist     Chair/bed transfer assist level: Minimal Assistance - Patient > 75%     Locomotion Ambulation   Ambulation assist      Assist level: Contact Guard/Touching assist Assistive device: Walker-rolling Max distance: 25 ft   Walk 10 feet activity   Assist     Assist level: Contact Guard/Touching assist Assistive device: Walker-rolling   Walk 50 feet activity   Assist Walk 50 feet with 2 turns activity did not occur: Safety/medical concerns (pain/weakness/fatigue)         Walk 150 feet activity   Assist Walk 150 feet activity did not occur: Safety/medical concerns (pain/weakness/fatigue)         Walk 10 feet on uneven surface  activity   Assist Walk 10 feet on uneven surfaces activity did not occur: Safety/medical concerns (pain/weakness/fatigue)         Wheelchair     Assist Is the patient using a wheelchair?: Yes Type of Wheelchair: Manual    Wheelchair assist level: Dependent - Patient 0% Max wheelchair distance: 150 ft    Wheelchair 50 feet with 2 turns activity    Assist        Assist Level: Dependent - Patient 0%   Wheelchair 150 feet activity     Assist      Assist Level: Dependent - Patient 0%   Blood pressure 130/64, pulse 87, temperature 98.5 F (36.9 C), temperature source Oral, resp. rate 18, height 5' 3 (1.6 m), weight 55.7 kg, SpO2 100%.  Medical Problem List and Plan: 1. Functional deficits secondary to  L femoral neck fx s/p Dr Kendal per fixation 01/19/24             -Per Ortho (1/19)patient may shower and can get incision wet, do not submerge.              -ELOS/Goals: 12-14 days- supervision            -Continue CIR  - Expected discharge tentatively 2/5   2.  Antithrombotics: -DVT/anticoagulation:  Mechanical:   Antiembolism stockings, knee (TED hose) Bilateral lower extremities Sequential compression devices, below knee Bilateral lower extremities             -antiplatelet therapy: Aspirin              - Vas US  Doppler pending- neg for DVT 3. Pain Management: As needed Vicodin and Tylenol  4. Mood/Behavior/Sleep: LCSW to follow for evaluation and support when available.              -antipsychotic agents: N/A 5. Neuropsych/cognition: This patient is capable of making decisions on her own behalf. 6. Skin/Wound Care: Routine pressure relief measures.  Change incisional dressing as needed. ?Staple remove at 2 weeks- sounds like 1/31?.  7. Fluids/Electrolytes/Nutrition: Monitor strict I&O and weight. Follow up labs per HD. - Moderate malnutrition: BMI 20.35 kg/m  8.  Close left femoral fracture: S/p percutaneous fixation of left femoral neck on 1/16.  Outpatient follow-up with Dr. Kendal in 2 weeks.             - WBAT LLE, continue PT. 9.  ESRD: On hemodialysis, TTS. Oliguric.  Continue HD per schedule.   -Mild hyperkalemia.  Continue binders and Auryxia . Monitor volume status and labs.     -1/28 reviewed nephrology note, Lokelma  ordered today for hyperkalemia. -1/29 reviewed nephrology note, has HD today 10.  Essential HTN/chronic HFrEF: Euvolemic, BP stable.  Continue Toprol  25 mg daily. 11.  Chronic hypoxic respiratory failure: Currently stable on 2 L O2 via nasal cannula (baseline) 12.  Cough: r/t recent pneumonia.  Continue Mucinex  twice daily, Tessalon  and Tussionex suspension.  Encourage incentive spirometer.  - 1/29-30 reports this has improved continue to monitor 13.  Vitamin D  deficiency: Continue vitamin D3 Supp. 14.  Low-grade temp: 100.5 degrees, may need to repeat respiratory viral panel.   -1/28 yesterday Temperature 100.1.  No signs or symptoms of infection noted.  WBC not elevated.  Continue to monitor  -1/29 Tmax 99.6 continue to monitor 15.  CAD: Continue Toprol -XL and aspirin . 16.  Anemia of CKD  -1/28 HGB 9.4, continue to monitor  - 1/29 patient asked about her iron  supplementation.  Her ferritin 1493 and saturation 33.  Nephrology notes indicate Venofer, however does not look like she is getting needed.  Discussed with Dr. Melia ferric citrate  is being used for binder.  Notified him she is declining these due to size of the pills.    LOS: 2 days A FACE TO FACE EVALUATION WAS PERFORMED  Murray Collier 02/01/2024, 11:41 AM     "

## 2024-02-01 NOTE — Progress Notes (Signed)
 " [facility] Progress Note   Subjective:   Reports she is a little out of breath after moving around but tolerating PT, walking hallways. Denies f/c/n/v.  Objective Vitals:   01/31/24 0701 01/31/24 1322 01/31/24 1924 02/01/24 0454  BP:  119/60 107/62 130/64  Pulse:  99 89 87  Resp:  18 16 18   Temp:  99.6 F (37.6 C) 98.9 F (37.2 C) 98.5 F (36.9 C)  TempSrc:  Oral Oral Oral  SpO2:  100% 100% 100%  Weight: 55.7 kg     Height:       Physical Exam General: Well appearing, NAD.  Heart: RRR Lungs: CTA anteriorly Abdomen: soft Extremities: no LE edema Dialysis Access: LUE AVG +t/b  Additional Objective Labs: Basic Metabolic Panel: Recent Labs  Lab 01/26/24 0451 01/27/24 1152 01/30/24 1340  NA 130* 127* 132*  K 5.3* 5.6* 5.5*  CL 94* 92* 93*  CO2 28 22 28   GLUCOSE 87 82 103*  BUN 20 36* 45*  CREATININE 4.28* 6.27* 7.71*  CALCIUM  8.6* 9.1 8.9  PHOS  --   --  5.8*   Liver Function Tests: Recent Labs  Lab 01/30/24 1340  ALBUMIN  3.2*   No results for input(s): LIPASE, AMYLASE in the last 168 hours. CBC: Recent Labs  Lab 01/26/24 0451 01/30/24 1340  WBC 5.4 6.4  HGB 9.9* 9.4*  HCT 31.0* 29.8*  MCV 97.5 95.8  PLT 218 190   Blood Culture    Component Value Date/Time   SDES BLOOD RIGHT ARM 01/01/2024 1316   SDES BLOOD RIGHT ARM 01/01/2024 1316   SPECREQUEST  01/01/2024 1316    BOTTLES DRAWN AEROBIC ONLY Blood Culture adequate volume   SPECREQUEST  01/01/2024 1316    BOTTLES DRAWN AEROBIC ONLY Blood Culture adequate volume   CULT  01/01/2024 1316    NO GROWTH 5 DAYS Performed at [facility], [address] [place], [place] [address]    CULT  01/01/2024 1316    NO GROWTH 5 DAYS Performed at [facility], [address], [place], [place] [address]    REPTSTATUS 01/06/2024 FINAL 01/01/2024 1316   REPTSTATUS 01/06/2024 FINAL 01/01/2024 1316    Cardiac Enzymes: No results for input(s): CKTOTAL, CKMB, CKMBINDEX,  TROPONINI in the last 168 hours. CBG: No results for input(s): GLUCAP in the last 168 hours. Iron  Studies: No results for input(s): IRON , TIBC, TRANSFERRIN, FERRITIN in the last 72 hours. @lablastinr3 @ Studies/Results: No results found. Medications:   aspirin  EC  325 mg Oral Daily   benzonatate   200 mg Oral TID   chlorpheniramine-HYDROcodone   5 mL Oral Q12H   cholecalciferol   2,000 Units Oral Daily   docusate sodium   100 mg Oral BID   ferric citrate   420 mg Oral TID WC   guaiFENesin   600 mg Oral BID   metoprolol  succinate  25 mg Oral Daily   multivitamin  1 tablet Oral QHS   paricalcitol   4.5 mcg Intravenous Q T,Th,Sa-HD   zinc  sulfate (50mg  elemental zinc )  220 mg Oral QHS    Dialysis Orders: [facility] EDW 54 kg. 3.5 hours. 2K/2.5 calcium . AVG flow rates: 400/600. Heparin : None. Meds: EPO 6200 units every treatment, Venofer, Zemplar  4.5 mcg every treatment. Binders: Auryxia  3 tabs 3 times daily AC.     Assessment/Plan:  ESRD  - TTS schedule; tolerated HD on Tues with 1.4L UF but alarming of the machine. Not getting heparin  as outpt. - Next HD today; walking the halls. -  K high -> lokelma ; renal diet    Closed femoral fracture - Fall while riding home from HD on bus - S/p surgery 1/16 - Ongoing PT/OT   Volume/ hypertension  -  BP decent, no edema on exam - Leaving under EDW here, will lower on discharge   Anemia of Chronic Kidney Disease - Hgb 9.9 - Allergy to Aranesp , transfuse prn   Secondary Hyperparathyroidism/Hyperphosphatemia - CorrCa ok, Phos high - continue Auryxia  as binder (coming down slowly)   Dispo - CIR  "

## 2024-02-02 DIAGNOSIS — N186 End stage renal disease: Secondary | ICD-10-CM

## 2024-02-02 DIAGNOSIS — R051 Acute cough: Secondary | ICD-10-CM

## 2024-02-02 MED ORDER — GUAIFENESIN ER 600 MG PO TB12: 600.0000 mg | ORAL_TABLET | Freq: Two times a day (BID) | ORAL | Status: AC | PRN

## 2024-02-02 NOTE — Progress Notes (Signed)
 " Inpatient Rehabilitation Care Coordinator Assessment and Plan Patient Details  Name: Barbara Crawford MRN: [id number] Date of Birth: 1955/01/16  Today's Date: 02/02/2024  Hospital Problems: Principal Problem:   Displaced fracture of left femoral neck (HCC)  Past Medical History:  Past Medical History:  Diagnosis Date   Anxiety    ESRD (end stage renal disease) on dialysis (HCC)    Heart failure with mildly reduced ejection fraction (HFmrEF) (HCC)    Hypertension    Past Surgical History:  Past Surgical History:  Procedure Laterality Date   AV FISTULA PLACEMENT Left 03/13/2020   Procedure: Creation of Left Brachiocephalic fistula;  Surgeon: Gretta Lonni PARAS, MD;  Location: [facility] OR;  Service: Vascular;  Laterality: Left;   HIP PINNING,CANNULATED Left 01/19/2024   Procedure: FIXATION, FEMUR, NECK, PERCUTANEOUS, USING SCREW;  Surgeon: Kendal Franky SQUIBB, MD;  Location: [facility] OR;  Service: Orthopedics;  Laterality: Left;   IR FLUORO GUIDE CV LINE RIGHT  02/26/2020   IR FLUORO GUIDE CV LINE RIGHT  03/09/2020   IR US  GUIDE VASC ACCESS RIGHT  02/26/2020   IR US  GUIDE VASC ACCESS RIGHT  03/09/2020   RIGHT HEART CATH N/A 05/12/2023   Procedure: RIGHT HEART CATH;  Surgeon: Wendel Lurena POUR, MD;  Location: [facility] INVASIVE CV LAB;  Service: Cardiovascular;  Laterality: N/A;   RIGHT/LEFT HEART CATH AND CORONARY ANGIOGRAPHY N/A 10/07/2021   Procedure: RIGHT/LEFT HEART CATH AND CORONARY ANGIOGRAPHY;  Surgeon: Jordan, Peter M, MD;  Location: [facility] INVASIVE CV LAB;  Service: Cardiovascular;  Laterality: N/A;   Social History:  reports that she has never smoked. She has never used smokeless tobacco. She reports that she does not currently use alcohol. She reports that she does not currently use drugs after having used the following drugs: Marijuana.  Family / Support Systems Marital Status: Single Patient Roles: Parent, Other (Comment) Children: Barbara Crawford Gilmore Lame Other Supports: Sister,  Barbara Crawford Anticipated Caregiver: Barbara Crawford and Frederick Ability/Limitations of Caregiver: Both work Medical Laboratory Scientific Officer: Other (Comment) (Will follow-up to verify work schedules) Family Dynamics: Has some family support  Social History Preferred language: English Religion: Baptist Education: Some college Primary School Teacher - How often do you need to have someone help you when you read instructions, pamphlets, or other written material from your doctor or pharmacy?: Never Writes: Yes Employment Status: Retired Age Retired: 66 Marine Scientist Issues: N/a Guardian/Conservator: N/a   Abuse/Neglect Abuse/Neglect Assessment Can Be Completed: Yes Physical Abuse: Denies Verbal Abuse: Denies Sexual Abuse: Denies Exploitation of patient/patient's resources: Denies Self-Neglect: Denies  Patient response to: Social Isolation - How often do you feel lonely or isolated from those around you?: Rarely  Emotional Status Pt's affect, behavior and adjustment status: Adjusting to therapy Recent Psychosocial Issues: None Psychiatric History: None Substance Abuse History: None  Patient / Family Perceptions, Expectations & Goals Pt/Family understanding of illness & functional limitations: Patient/family understanding of illness & functional limitations Premorbid pt/family roles/activities: Active in the community Anticipated changes in roles/activities/participation: None anticipated 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, [employer] Agencies: None Premorbid Home Care/DME Agencies: Other (Comment) (3-1 commode, 4-prong narrow-based cane, rollator, shower chair, walker, concentrator for o2) Transportation available at discharge: Yes Is the patient able to respond to transportation needs?: Yes In the past 12 months, has lack of transportation kept you from medical appointments or from getting medications?: No In the past 12 months, has lack of  transportation kept you from meetings, work, or from getting things needed for daily living?: No  Discharge  Planning Living Arrangements: Children Support Systems: Children, Other relatives Type of Residence: Private residence Insurance Resources: Media Planner (specify) (BLUE CROSS BLUE SHIELD MEDICARE / BCBS MEDICARE) Financial Resources: Social Security Financial Screen Referred: Yes Living Expenses: Rent Money Management: Patient Does the patient have any problems obtaining your medications?: No Home Management: [place] own home Patient/Family Preliminary Plans: Plans to return home with daughter Care Coordinator Anticipated Follow Up Needs: HH/OP DC Planning Additional Notes/Comments: Needs a letter for apartment accomodations to the lower level Expected length of stay: 12-14 days  Clinical Impression CSW met with patient/family to introduce herself and complete initial assessment. Patient presented to [facility] following Displaced fracture of left femoral neck. Patient is able to make needs known. Barbara Crawford is a 69 y/o retired female who lives with her daughter Barbara Crawford. She lives on the 2nd floor and has requested a letter to accommodate for a lower-level unit as she believes there are some open.   There were no further needs or concerns at present. CSW will follow up with family and continue to follow. Will provide patient/family with an update as soon as one becomes available.    Di'Asia  Loreli 02/02/2024, 10:26 AM    "

## 2024-02-02 NOTE — Plan of Care (Signed)
" °  Problem: RH BOWEL ELIMINATION Goal: RH STG MANAGE BOWEL W/MEDICATION W/ASSISTANCE Description: STG Manage Bowel with Medication with mod I Assistance. Outcome: Progressing   Problem: RH SAFETY Goal: RH STG ADHERE TO SAFETY PRECAUTIONS W/ASSISTANCE/DEVICE Description: STG Adhere to Safety Precautions With cues Assistance/Device. Outcome: Progressing   Problem: RH PAIN MANAGEMENT Goal: RH STG PAIN MANAGED AT OR BELOW PT'S PAIN GOAL Description: Pain < 4 with prns Outcome: Progressing   Problem: RH KNOWLEDGE DEFICIT GENERAL Goal: RH STG INCREASE KNOWLEDGE OF SELF CARE AFTER HOSPITALIZATION Description: Patient will be able to manage care at discharge using educational resources for medications and safety independently Outcome: Progressing   "

## 2024-02-02 NOTE — Progress Notes (Signed)
 Occupational Therapy Session Note  Patient Details  Name: Barbara Crawford MRN: [id number] Date of Birth: 1955/12/02  Session 1: Today's Date: 02/02/2024 OT Individual Time: 9067-8953 OT Individual Time Calculation (min): 74 min   Session 2: Today's Date: 02/02/2024 OT Individual Time: 8697-8654 OT Individual Time Calculation (min): 43 min    Short Term Goals: Week 1:  OT Short Term Goal 1 (Week 1): STG=LTG d.t elos  SESSION 1: Skilled Therapeutic Interventions/Progress Updates:  Patient agreeable to participate in OT session. Reports 0/10 pain level.   Patient participated in skilled OT session focusing on self care, bathing and dressing at sink, functional mobility, activity tolerance. Patient received in chair ready for OT. Patient completed bathing at sink overall CGA level. Patient then completed UB dressing SU, LB dressing CGA to SUP level. Patient required increased time. Patient then transported to rehab gym. Patient completed functional transfer to NuSTEP. Patient then completed utilizing b/L LE. RUE due to pain and decreased ROM in LUE at 35 spm, pace for 450 steps to increase LE strengthening, and functional activity tolerance at a level 3 resistance. Patient then completed 2x7 sit to stands to increase LE strengthening related to standing ADLs. Patient then completed functional mobility towards room fro 50 ft with CGA to increase transfers and in home ambulation for ADLS/ IADLS. Patient returned to room, all needs in reach all verbalized needs met.     SESSION 2: Skilled Therapeutic Interventions/Progress Updates:  Patient agreeable to participate in OT session. Reports 2/10 pain level.   Patient participated in skilled OT session focusing on stair training, single leg balance, dynamic standing balance. Patient received in room ready for OT. OT transport to gym for stair training due to biggest deficit observed. Patient able to complete stairs with increased time, min  A. Patient able to complete 4 6 steps. Patient then able to complete functional mobility to chair with SUP A to CGA. Patient then able to complete 7x sit to stands to increase LE strength related to functional transfers. Patient then completed dynamic balance activities of SLS with cone taps to increase ability to complete stairs. Returned to room all needs in reach alarm on.   Therapy Documentation Precautions:  Precautions Precautions: Fall Recall of Precautions/Restrictions: Intact Precaution/Restrictions Comments: reminders to elevate LLE Restrictions Weight Bearing Restrictions Per Provider Order: Yes LLE Weight Bearing Per Provider Order: Weight bearing as tolerated  Therapy/Group: Individual Therapy  Barbara Crawford 02/02/2024, 8:00 AM

## 2024-02-02 NOTE — Progress Notes (Signed)
 Inpatient Rehabilitation Center Individual Statement of Services  Patient Name:  Telecia Larocque  Date:  02/02/2024  Welcome to the Inpatient Rehabilitation Center.  Our goal is to provide you with an individualized program based on your diagnosis and situation, designed to meet your specific needs.  With this comprehensive rehabilitation program, you will be expected to participate in at least 3 hours of rehabilitation therapies Monday-Friday, with modified therapy programming on the weekends.  Your rehabilitation program will include the following services:  Physical Therapy (PT), Occupational Therapy (OT), Speech Therapy (ST), 24 hour per day rehabilitation nursing, Therapeutic Recreaction (TR), Care Coordinator, Rehabilitation Medicine, Nutrition Services, and Pharmacy Services  Weekly team conferences will be held on Wednesday to discuss your progress.  Your Inpatient Rehabilitation Care Coordinator will talk with you frequently to get your input and to update you on team discussions.  Team conferences with you and your family in attendance may also be held.  Expected length of stay: 12-14 days  Overall anticipated outcome: Independent with assistive device   Depending on your progress and recovery, your program may change. Your Inpatient Rehabilitation Care Coordinator will coordinate services and will keep you informed of any changes. Your Inpatient Rehabilitation Care Coordinator's name and contact numbers are listed  below.  The following services may also be recommended but are not provided by the Inpatient Rehabilitation Center:  Driving Evaluations Home Health Rehabiltiation Services Outpatient Rehabilitation Services Vocational Rehabilitation   Arrangements will be made to provide these services after discharge if needed.  Arrangements include referral to agencies that provide these services.  Your insurance has been verified to be: BLUE CROSS BLUE SHIELD MEDICARE / BCBS  MEDICARE  Your primary doctor is:  Tammy Tari DASEN, PA-C   Pertinent information will be shared with your doctor and your insurance company.  Inpatient Rehabilitation Care Coordinator:  Di'Asia Loreli SIERRAS [phone] or ELIGAH BRINKS  Information discussed with and copy given to patient by: Waverly Loreli, 02/02/2024, 10:24 AM

## 2024-02-02 NOTE — Progress Notes (Signed)
 Patient ID: Barbara Crawford, female   DOB: 11/11/1955, 69 y.o.   MRN: [id number]  Statement of service delivered.

## 2024-02-02 NOTE — Progress Notes (Signed)
 Patient refused all her meds last for this shift, tried unsuccessfully with encouragement but insisted she was not going to take. Charge made aware.

## 2024-02-02 NOTE — Progress Notes (Signed)
 Occupational Therapy Session Note  Patient Details  Name: Barbara Crawford MRN: [id number] Date of Birth: 18-Feb-1955  Today's Date: 02/02/2024 OT Individual Time: [phone] OT Individual Time Calculation (min): 42 min    Short Term Goals: Week 1:  OT Short Term Goal 1 (Week 1): STG=LTG d.t elos  Skilled Therapeutic Interventions/Progress Updates:  Skilled OT session completed to address dynamic standing balance. Pt received seated in WC direct handoff from PT, 2L O2 via [place], agreeable to participate in therapy. Pt reports no pain.  Pt transitioned to portable tank receiving 2L O2 via [place]. Pt dependently propelled to main gym. Pt completed 5x step-ups at 6in step with RW on right to increase dynamic standing balance and strength in BLE when completing ADLs. Pt displaying increased anxiety throughout task d/t fear of falling. OT providing therapeutic use of self throughout VC for balance and to increase activity tolerance. Pt continues task, repeated trial 2 with supervision for sequencing as pt attempts to step up on LLE first. Pt completes dynamic standing activity on airfoam targeting bilateral, anterior, and posterior weight shifting, pt completes first trial standing with RW with supervision. Task upgraded, no RW CGA d/t increased fear; however, no LOB noted. Pt seated in WC, dependently propelled back to room. Pt transitioned from portable tank, seated in [facility] with all needs in reach.   Therapy Documentation Precautions:  Precautions Precautions: Fall Recall of Precautions/Restrictions: Intact Precaution/Restrictions Comments: reminders to elevate LLE Restrictions Weight Bearing Restrictions Per Provider Order: Yes LLE Weight Bearing Per Provider Order: Weight bearing as tolerated   Therapy/Group: Individual Therapy  Captain Blucher Woods-Chance, MS, OTR/L 02/02/2024, 7:59 AM

## 2024-02-02 NOTE — Progress Notes (Signed)
 Physical Therapy Session Note  Patient Details  Name: Barbara Crawford MRN: [id number] Date of Birth: 09-19-1955  Today's Date: 02/02/2024 PT Individual Time: 0823-0902 PT Individual Time Calculation (min): 39 min   Short Term Goals: Week 1:  PT Short Term Goal 1 (Week 1): STG=LTG due to ELOS  Skilled Therapeutic Interventions/Progress Updates:    Pt presents EOB. Education and discussion in regards to overall goals, progress, and home environment/discharge planning. Pt performed functional mobility at overall S level with RW in room and on unit  x 150' with focus on overall endurance, strengthening and dynamic balance while reading signage on walls and dual tasking. Slow cadence noted but overall safe with mobility. Pt completed session without supplemental O2 (did not have on when arrived in room) with sats 94-98% and HR up to low 100's with activity. Denies SOB, just generalized fatigue. Discussed and educated on pt having her story on the wall as well as participation in dance group in future session - pt very excited about both of these!  Therapy Documentation Precautions:  Precautions Precautions: Fall Recall of Precautions/Restrictions: Intact Precaution/Restrictions Comments: reminders to elevate LLE Restrictions Weight Bearing Restrictions Per Provider Order: Yes LLE Weight Bearing Per Provider Order: Weight bearing as tolerated  Pain: Pain Assessment Pain Scale: 0-10 Pain Score: 0-No pain    Therapy/Group: Individual Therapy  Barbara Crawford, PT, DPT, CBIS  02/02/2024, 12:15 PM

## 2024-02-02 NOTE — Progress Notes (Signed)
 Patient ID: Barbara Crawford, female   DOB: [birth year], 69 y.o.   MRN: [id number]  [facility] following for [facility]

## 2024-02-02 NOTE — IPOC Note (Signed)
 Overall Plan of Care [facility]) Patient Details Name: Barbara Crawford MRN: [id number] DOB: 02/15/1955  Admitting Diagnosis: Displaced fracture of left femoral neck [facility])  Hospital Problems: Principal Problem:   Displaced fracture of left femoral neck (HCC)     Functional Problem List: Nursing Bowel, Safety, Endurance, Medication Management, Pain  PT Balance, Edema, Endurance, Motor, Pain, Safety, Sensory, Skin Integrity  OT Balance, Pain, Safety, Sensory, Motor  SLP    TR         Basic ADLs: OT Bathing, Dressing, Toileting     Advanced  ADLs: OT Light Housekeeping     Transfers: PT Bed Mobility, Bed to Chair, Set Designer, Oncologist: PT Ambulation, Psychologist, [employer], Stairs     Additional Impairments: OT Fuctional Use of Upper Extremity  SLP        TR      Anticipated Outcomes Item Anticipated Outcome  Self Feeding mod I  Swallowing      Basic self-care  SUP  Toileting  mod I   Bathroom Transfers SUP  Bowel/Bladder  manage bowel w mod I assist  Transfers  mod I  Locomotion  mod I for amb; sup for stairs  Communication     Cognition     Pain  Pain < 4 with prns  Safety/Judgment  manage safety w cues   Therapy Plan: PT Intensity: Minimum of 1-2 x/day ,45 to 90 minutes PT Frequency: 5 out of 7 days PT Duration Estimated Length of Stay: 7-10 days OT Intensity: Minimum of 1-2 x/day, 45 to 90 minutes OT Frequency: 5 out of 7 days OT Duration/Estimated Length of Stay: 7-10     Team Interventions: Nursing Interventions Patient/Family Education, Pain Management, Bowel Management, Medication Management, Disease Management/Prevention, Discharge Planning  PT interventions Ambulation/gait training, Discharge planning, Functional mobility training, Psychosocial support, Therapeutic Activities, Visual/perceptual remediation/compensation, Therapeutic Exercise, Wheelchair propulsion/positioning, Skin care/wound management,  Neuromuscular re-education, Disease management/prevention, Warden/ranger, Cognitive remediation/compensation, DME/adaptive equipment instruction, Pain management, Splinting/orthotics, UE/LE Strength taining/ROM, UE/LE Coordination activities, Stair training, Patient/family education, Functional electrical stimulation, Community reintegration  OT Interventions Warden/ranger, Discharge planning, Community reintegration, Fish Farm Manager, Neuromuscular re-education, Psychosocial support, UE/LE Strength taining/ROM, Visual/perceptual remediation/compensation, Therapeutic Exercise, Skin care/wound managment, Patient/family education, Functional mobility training, Disease mangement/prevention, Cognitive remediation/compensation, Pain management, Self Care/advanced ADL retraining, Therapeutic Activities, UE/LE Coordination activities  SLP Interventions    TR Interventions    SW/CM Interventions Discharge Planning, Psychosocial Support, Patient/Family Education, Disease Management/Prevention   Barriers to Discharge MD  Medical stability, Hemodialysis, and Medication compliance  Nursing Decreased caregiver support, Home environment access/layout 1 level flight up to apt; left rail  PT Home environment access/layout    OT Decreased caregiver support, Home environment access/layout    SLP      SW       Team Discharge Planning: Destination: PT-Home ,OT- Home , SLP-  Projected Follow-up: PT-Home health PT, OT-  24 hour supervision/assistance, Home health OT, SLP-  Projected Equipment Needs: PT-To be determined, OT- To be determined, SLP-  Equipment Details: PT- , OT-  Patient/family involved in discharge planning: PT- Patient,  OT-Patient, SLP-   MD ELOS: 7-10 Medical Rehab Prognosis:  Excellent Assessment: The patient has been admitted for CIR therapies with the diagnosis of L femoral neck fx s/p fixation 01/19/24. The team will be addressing functional  mobility, strength, stamina, balance, safety, adaptive techniques and equipment, self-care, bowel and bladder mgt, patient and caregiver education. Goals have been set at  mod I /sup. Anticipated discharge destination is home.        See Team Conference Notes for weekly updates to the plan of care

## 2024-02-02 NOTE — Progress Notes (Signed)
 " [facility] Progress Note   Subjective:   Reports she is a little out of breath after moving around but tolerating PT, walking hallways. Denies f/c/n/v. She had a tiny bit of cramping with HD treatment yesterday and felt weak afterwards  Objective Vitals:   02/01/24 1800 02/01/24 1801 02/01/24 1922 02/02/24 0408  BP: (!) 144/97 (!) 147/84 (!) 148/63 136/68  Pulse: 97 97 100 88  Resp: (!) 28 (!) 23 18 18   Temp: 98.7 F (37.1 C)  99 F (37.2 C) 98.4 F (36.9 C)  TempSrc: Oral  Oral Oral  SpO2: 100% 100% 100% 100%  Weight: 56.5 kg     Height:       Physical Exam General: Well appearing, NAD.  Heart: RRR Lungs: CTA anteriorly Abdomen: soft Extremities: no LE edema Dialysis Access: LUE AVG +t/b  Additional Objective Labs: Basic Metabolic Panel: Recent Labs  Lab 01/27/24 1152 01/30/24 1340  NA 127* 132*  K 5.6* 5.5*  CL 92* 93*  CO2 22 28  GLUCOSE 82 103*  BUN 36* 45*  CREATININE 6.27* 7.71*  CALCIUM  9.1 8.9  PHOS  --  5.8*   Liver Function Tests: Recent Labs  Lab 01/30/24 1340  ALBUMIN  3.2*   No results for input(s): LIPASE, AMYLASE in the last 168 hours. CBC: Recent Labs  Lab 01/30/24 1340  WBC 6.4  HGB 9.4*  HCT 29.8*  MCV 95.8  PLT 190   Blood Culture    Component Value Date/Time   SDES BLOOD RIGHT ARM 01/01/2024 1316   SDES BLOOD RIGHT ARM 01/01/2024 1316   SPECREQUEST  01/01/2024 1316    BOTTLES DRAWN AEROBIC ONLY Blood Culture adequate volume   SPECREQUEST  01/01/2024 1316    BOTTLES DRAWN AEROBIC ONLY Blood Culture adequate volume   CULT  01/01/2024 1316    NO GROWTH 5 DAYS Performed at [facility], [address] [place], [place] [address]    CULT  01/01/2024 1316    NO GROWTH 5 DAYS Performed at [facility], [address] [place], [place] [address]    REPTSTATUS 01/06/2024 FINAL 01/01/2024 1316   REPTSTATUS 01/06/2024 FINAL 01/01/2024 1316    Cardiac Enzymes: No results for input(s):  CKTOTAL, CKMB, CKMBINDEX, TROPONINI in the last 168 hours. CBG: No results for input(s): GLUCAP in the last 168 hours. Iron  Studies: No results for input(s): IRON , TIBC, TRANSFERRIN, FERRITIN in the last 72 hours. @lablastinr3 @ Studies/Results: VAS US  LOWER EXTREMITY VENOUS (DVT) Result Date: 02/01/2024  Lower Venous DVT Study Patient Name:  Barbara Crawford  Date of Exam:   02/01/2024 Medical Rec #: [id number]              Accession #:    [id number] Date of Birth: 10-27-1955             Patient Gender: F Patient Age:   69 years Exam Location:  [facility] Procedure:      VAS US  LOWER EXTREMITY VENOUS (DVT) Referring Phys: TORIBIO PITCH --------------------------------------------------------------------------------  Indications: Edema. Other Indications: Rehab patient. Risk Factors: Fall with femoral neck fracture on 01/18/2024 s/p surgical repair on 01/19/2024. Comparison Study: Previous exam on 03/07/2020 was negative for DVT Performing Technologist: Ezzie Potters RVT, RDMS  Examination Guidelines: A complete evaluation includes B-mode imaging, spectral Doppler, color Doppler, and power Doppler as needed of all accessible portions of each vessel. Bilateral testing is considered an integral part of a complete examination. Limited examinations for reoccurring indications may  be performed as noted. The reflux portion of the exam is performed with the patient in reverse Trendelenburg.  +---------+---------------+---------+-----------+----------+--------------+ RIGHT    CompressibilityPhasicitySpontaneityPropertiesThrombus Aging +---------+---------------+---------+-----------+----------+--------------+ CFV      Full           No       Yes                                 +---------+---------------+---------+-----------+----------+--------------+ SFJ      Full                                                         +---------+---------------+---------+-----------+----------+--------------+ FV Prox  Full           No       Yes                                 +---------+---------------+---------+-----------+----------+--------------+ FV Mid   Full           No       Yes                                 +---------+---------------+---------+-----------+----------+--------------+ FV DistalFull           No       Yes                                 +---------+---------------+---------+-----------+----------+--------------+ PFV      Full                                                        +---------+---------------+---------+-----------+----------+--------------+ POP      Full           No       Yes                                 +---------+---------------+---------+-----------+----------+--------------+ PTV      Full                                                        +---------+---------------+---------+-----------+----------+--------------+ PERO     Full                                                        +---------+---------------+---------+-----------+----------+--------------+   +---------+---------------+---------+-----------+----------+--------------+ LEFT     CompressibilityPhasicitySpontaneityPropertiesThrombus Aging +---------+---------------+---------+-----------+----------+--------------+ CFV      Full           Yes      Yes                                 +---------+---------------+---------+-----------+----------+--------------+  SFJ      Full                                                        +---------+---------------+---------+-----------+----------+--------------+ FV Prox  Full           Yes      Yes                                 +---------+---------------+---------+-----------+----------+--------------+ FV Mid   Full           Yes      Yes                                  +---------+---------------+---------+-----------+----------+--------------+ FV DistalFull           Yes      Yes                                 +---------+---------------+---------+-----------+----------+--------------+ PFV      Full                                                        +---------+---------------+---------+-----------+----------+--------------+ POP      Full           Yes      Yes                                 +---------+---------------+---------+-----------+----------+--------------+ PTV      Full                                                        +---------+---------------+---------+-----------+----------+--------------+ PERO     Full                                                        +---------+---------------+---------+-----------+----------+--------------+     Summary: BILATERAL: - No evidence of deep vein thrombosis seen in the lower extremities, bilaterally. -No evidence of popliteal cyst, bilaterally.   *See table(s) above for measurements and observations. Electronically signed by [name] Serve on 02/01/2024 at 4:04:34 PM.    Final    Medications:   aspirin  EC  325 mg Oral Daily   benzonatate   200 mg Oral TID   chlorpheniramine-HYDROcodone   5 mL Oral Q12H   cholecalciferol   2,000 Units Oral Daily   docusate sodium   100 mg Oral BID   guaiFENesin   600 mg Oral BID   metoprolol  succinate  25 mg Oral Daily   multivitamin  1 tablet Oral QHS   paricalcitol   4.5 mcg Intravenous Q T,Th,Sa-HD  sucroferric oxyhydroxide  1,000 mg Oral TID WC   zinc  sulfate (50mg  elemental zinc )  220 mg Oral QHS    Dialysis Orders: [facility] T[facility] EDW 54 kg. 3.5 hours. 2K/2.5 calcium . AVG flow rates: 400/600. Heparin : None. Meds: EPO 6200 units every treatment, Venofer, Zemplar  4.5 mcg every treatment. Binders: Auryxia  3 tabs 3 times daily AC.     Assessment/Plan:  ESRD  - TTS schedule; tolerated HD on Tues with 1.4L UF but alarming of the machine  (will give trial with heparin ). Not getting heparin  as outpt. - Next HD Saturday; walking the halls, tolerating PT.   Closed femoral fracture - Fall while riding home from HD on bus - S/p surgery 1/16 - Ongoing PT/OT   Volume/ hypertension  -  BP decent, no edema on exam - Leaving under EDW here, will lower on discharge   Anemia of Chronic Kidney Disease - Hgb 9.9 - Allergy to Aranesp , transfuse prn   Secondary Hyperparathyroidism/Hyperphosphatemia - CorrCa ok, Phos high - continue Auryxia  as binder (coming down slowly)   Dispo - CIR  "

## 2024-02-03 MED ORDER — HEPARIN SODIUM (PORCINE) 1000 UNIT/ML DIALYSIS: 2000.0000 [IU] | Freq: Once | INTRAMUSCULAR | Status: DC

## 2024-02-03 MED ORDER — HEPARIN SODIUM (PORCINE) 1000 UNIT/ML IJ SOLN
INTRAMUSCULAR | Status: AC
  Filled 2024-02-03: qty 2

## 2024-02-03 MED ORDER — PENTAFLUOROPROP-TETRAFLUOROETH EX AERO: 1.0000 | INHALATION_SPRAY | CUTANEOUS | Status: DC | PRN

## 2024-02-03 NOTE — Plan of Care (Signed)
" °  Problem: Consults Goal: RH GENERAL PATIENT EDUCATION Description: See Patient Education module for education specifics. Outcome: Progressing   Problem: RH BOWEL ELIMINATION Goal: RH STG MANAGE BOWEL WITH ASSISTANCE Description: STG Manage Bowel with mod I Assistance. Outcome: Progressing Goal: RH STG MANAGE BOWEL W/MEDICATION W/ASSISTANCE Description: STG Manage Bowel with Medication with mod I Assistance. Outcome: Progressing   Problem: RH SAFETY Goal: RH STG ADHERE TO SAFETY PRECAUTIONS W/ASSISTANCE/DEVICE Description: STG Adhere to Safety Precautions With cues Assistance/Device. Outcome: Progressing   Problem: RH PAIN MANAGEMENT Goal: RH STG PAIN MANAGED AT OR BELOW PT'S PAIN GOAL Description: Pain < 4 with prns Outcome: Progressing   Problem: RH KNOWLEDGE DEFICIT GENERAL Goal: RH STG INCREASE KNOWLEDGE OF SELF CARE AFTER HOSPITALIZATION Description: Patient will be able to manage care at discharge using educational resources for medications and safety independently Outcome: Progressing   "

## 2024-02-03 NOTE — Procedures (Signed)
 HD Note:  Some information was entered later than the data was gathered due to patient care needs. The stated time with the data is accurate.  Received patient in bed to unit.   Alert and oriented.   Informed consent signed and in chart.   Access used: upper left arm graft Access issues: No issues  Patient refused heparin  during the treatment.  She stated she does not take it at the outpatient clinic. Patient tolerated treatment well.   TX duration: 3.5 hours  Alert, without acute distress.  Total UF removed: 1500 ml  Hand-off given to patient's nurse.   Transported back to the room   Robbin Loughmiller L. Lenon, RN [facility] D[facility].

## 2024-02-03 NOTE — Progress Notes (Signed)
 "                                                        PROGRESS NOTE   Subjective/Complaints: No new complaints this morning She has some discomfort in left hip but denies pain. Has typical postsurgical swelling.   ROS: Patient denies new vision changes, dizziness, nausea, vomiting, diarrhea,  shortness of breath or chest pain, headache, or mood change. + low grade fever- improved +left hip discomfort    Objective:   No results found.  No results for input(s): WBC, HGB, HCT, PLT in the last 72 hours.  No results for input(s): NA, K, CL, CO2, GLUCOSE, BUN, CREATININE, CALCIUM  in the last 72 hours.   Intake/Output Summary (Last 24 hours) at 02/03/2024 1456 Last data filed at 02/03/2024 1336 Gross per 24 hour  Intake 436 ml  Output --  Net 436 ml        Physical Exam: Vital Signs Blood pressure 124/66, pulse 82, temperature 98.8 F (37.1 C), temperature source Oral, resp. rate 16, height 5' 3 (1.6 m), weight 56.5 kg, SpO2 100%.  General: No apparent distress, appears comfortable sitting in WC HEENT: Head is normocephalic, atraumatic, sclera anicteric, oral mucosa moist Neck: Supple without JVD or lymphadenopathy Heart: Reg rate and rhythm. No murmurs rubs or gallops Chest: CTA bilaterally without wheezes, rales, or rhonchi; no distress, on RA Abdomen: Soft, non-tender, non-distended, bowel sounds positive. 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    Assessment/Plan: 1. Functional deficits which require 3+  hours per day of interdisciplinary therapy in a comprehensive inpatient rehab setting. Physiatrist is providing close team supervision and 24 hour management of active medical problems listed below. Physiatrist and rehab team continue to assess barriers to discharge/monitor patient progress toward functional and medical goals  Care Tool:  Bathing    Body parts bathed by patient: Right arm, Left arm, Chest, Left lower leg, Abdomen, Front perineal area, Face, Buttocks, Right upper leg, Left upper leg, Right lower leg         Bathing assist Assist Level: Supervision/Verbal cueing     Upper Body Dressing/Undressing Upper body dressing   What is the patient wearing?: Pull over shirt    Upper body assist Assist Level: Independent    Lower Body Dressing/Undressing Lower body dressing      What is the patient wearing?: Underwear/pull up, Pants     Lower body assist Assist for lower body dressing: Contact Guard/Touching assist     Toileting Toileting    Toileting assist Assist for toileting: Minimal Assistance - Patient > 75%     Transfers Chair/bed transfer  Transfers assist     Chair/bed transfer assist level: Supervision/Verbal cueing     Locomotion Ambulation   Ambulation assist      Assist level: Supervision/Verbal cueing Assistive device: Walker-rolling Max distance: 150'   Walk  10 feet activity   Assist     Assist level: Supervision/Verbal cueing Assistive device: Walker-rolling   Walk 50 feet activity   Assist Walk 50 feet with 2 turns activity did not occur: Safety/medical concerns (pain/weakness/fatigue)  Assist level: Supervision/Verbal cueing Assistive device: Walker-rolling    Walk 150 feet activity   Assist Walk 150 feet activity did not occur: Safety/medical concerns (pain/weakness/fatigue)  Assist level: Supervision/Verbal cueing Assistive device: Walker-rolling    Walk 10 feet on uneven surface  activity   Assist Walk  10 feet on uneven surfaces activity did not occur: Safety/medical concerns (pain/weakness/fatigue)         Wheelchair     Assist Is the patient using a wheelchair?: Yes Type of Wheelchair: Manual    Wheelchair assist level: Dependent - Patient 0% Max wheelchair distance: 150 ft    Wheelchair 50 feet with 2 turns activity    Assist        Assist Level: Dependent - Patient 0%   Wheelchair 150 feet activity     Assist      Assist Level: Dependent - Patient 0%   Blood pressure 124/66, pulse 82, temperature 98.8 F (37.1 C), temperature source Oral, resp. rate 16, height 5' 3 (1.6 m), weight 56.5 kg, SpO2 100%.  Medical Problem List and Plan: 1. Functional deficits secondary to  L femoral neck fx s/p Dr Kendal per fixation 01/19/24             -Per Ortho (1/19)patient may shower and can get incision wet, do not submerge.              -ELOS/Goals: 12-14 days- supervision            -Continue CIR  - Expected discharge tentatively 2/5   2.  Antithrombotics: -DVT/anticoagulation:  Mechanical:  Antiembolism stockings, knee (TED hose) Bilateral lower extremities Sequential compression devices, below knee Bilateral lower extremities             -antiplatelet therapy: Aspirin              - Vas US  Doppler pending- neg for DVT 3. Pain Management: As needed Vicodin and Tylenol   - 1/30 pain well-controlled.  Not using frequent medications. 4. Mood/Behavior/Sleep: LCSW to follow for evaluation and support when available.              -antipsychotic agents: N/A 5. Neuropsych/cognition: This patient is capable of making decisions on her own behalf. 6. Skin/Wound Care: Routine pressure relief measures.  Change incisional dressing as needed. ?Staple remove at 2 weeks- sounds like 1/31?.  7. Fluids/Electrolytes/Nutrition: Monitor strict I&O and weight. Follow up labs per HD. - Moderate malnutrition: BMI 20.35 kg/m  -1/30 discontinue multivitamin and zinc  sulfate per patient  request.  She has been declining these 8.  Close left femoral fracture: S/p percutaneous fixation of left femoral neck on 1/16.  Outpatient follow-up with Dr. Kendal in 2 weeks.             - WBAT LLE, continue PT. 9.  ESRD: On hemodialysis, TTS. Oliguric.  Continue HD per schedule.   -Mild hyperkalemia.  Continue binders and Auryxia . Monitor volume status and labs.     -1/28 reviewed nephrology note, Lokelma  ordered today for hyperkalemia. -1/29 reviewed nephrology note, has HD today 1/30 reviewed nephrology note next hemodialysis tomorrow The ferric citrate  discussed with nephrology yesterday she can  chew it tho or break into 1/4's or dissolve in water  10.  Essential HTN/chronic HFrEF: Euvolemic,  BP stable.  Continue Toprol  25 mg daily.  11.  Chronic hypoxic respiratory failure: Currently stable on 2 L O2 via nasal cannula (baseline). Weight reviewed and is decreased  12.  Cough: r/t recent pneumonia. Continue prn, Tessalon  and Tussionex suspension.  Encourage incentive spirometer.  - 1/29 reports this has improved continue to monitor  1/30 stop tessalon - pt declines to take, will change Mucinex  to as needed  13.  Vitamin D  deficiency: Continue vitamin D3 Supp.  14.  Low-grade temp: 100.5 degrees, may need to repeat respiratory viral panel.   -1/28 yesterday Temperature 100.1.  No signs or symptoms of infection noted.  WBC not elevated.  Continue to monitor  -1/30 improved, patient reports she feels well.  Continue to monitor  15.  CAD: Continue Toprol -XL and aspirin .  16. Anemia of CKD  -1/28 HGB 9.4, continue to monitor  - 1/29 patient asked about her iron  supplementation.  Her ferritin 1493 and saturation 33.  Nephrology notes indicate Venofer, however does not look like she is getting needed.  Discussed with Dr. Melia -ferric citrate  is being used for binder.  Notified him she is declining these due to size of the pills. (See #9)  17. Hyperkalemia due to ESRD  - Nephrology  following, HD tomorrow      LOS: 4 days A FACE TO FACE EVALUATION WAS PERFORMED  Barbara Crawford Barbara Crawford 02/03/2024, 2:56 PM     "

## 2024-02-03 NOTE — Progress Notes (Signed)
 " [facility] Progress Note   Subjective:   Reports she is a little out of breath after moving around but tolerating PT, walking hallways. Denies f/c/n/v. She had a tiny bit of cramping with HD treatment past week and felt weak afterwards  Objective Vitals:   02/02/24 0408 02/02/24 1424 02/02/24 1939 02/03/24 0504  BP: 136/68 139/70 133/70 124/66  Pulse: 88 91 96 82  Resp: 18 18 16 16   Temp: 98.4 F (36.9 C) 99.8 F (37.7 C) 99.1 F (37.3 C) 98.8 F (37.1 C)  TempSrc: Oral Oral Oral Oral  SpO2: 100% 100% 98% 100%  Weight:      Height:       Physical Exam General: Well appearing, NAD.  Heart: RRR Lungs: CTA anteriorly Abdomen: soft Extremities: no LE edema Dialysis Access: LUE AVG +t/b  Additional Objective Labs: Basic Metabolic Panel: Recent Labs  Lab 01/27/24 1152 01/30/24 1340  NA 127* 132*  K 5.6* 5.5*  CL 92* 93*  CO2 22 28  GLUCOSE 82 103*  BUN 36* 45*  CREATININE 6.27* 7.71*  CALCIUM  9.1 8.9  PHOS  --  5.8*   Liver Function Tests: Recent Labs  Lab 01/30/24 1340  ALBUMIN  3.2*   No results for input(s): LIPASE, AMYLASE in the last 168 hours. CBC: Recent Labs  Lab 01/30/24 1340  WBC 6.4  HGB 9.4*  HCT 29.8*  MCV 95.8  PLT 190   Blood Culture    Component Value Date/Time   SDES BLOOD RIGHT ARM 01/01/2024 1316   SDES BLOOD RIGHT ARM 01/01/2024 1316   SPECREQUEST  01/01/2024 1316    BOTTLES DRAWN AEROBIC ONLY Blood Culture adequate volume   SPECREQUEST  01/01/2024 1316    BOTTLES DRAWN AEROBIC ONLY Blood Culture adequate volume   CULT  01/01/2024 1316    NO GROWTH 5 DAYS Performed at [facility], [address] [place], [place] [address]    CULT  01/01/2024 1316    NO GROWTH 5 DAYS Performed at [facility], [address], [place], [place] [address]    REPTSTATUS 01/06/2024 FINAL 01/01/2024 1316   REPTSTATUS 01/06/2024 FINAL 01/01/2024 1316    Cardiac Enzymes: No results for input(s): CKTOTAL,  CKMB, CKMBINDEX, TROPONINI in the last 168 hours. CBG: No results for input(s): GLUCAP in the last 168 hours. Iron  Studies: No results for input(s): IRON , TIBC, TRANSFERRIN, FERRITIN in the last 72 hours. @lablastinr3 @ Studies/Results: VAS US  LOWER EXTREMITY VENOUS (DVT) Result Date: 02/01/2024  Lower Venous DVT Study Patient Name:  Barbara Crawford  Date of Exam:   02/01/2024 Medical Rec #: [id number]              Accession #:    [id number] Date of Birth: 1955/08/20             Patient Gender: F Patient Age:   69 years Exam Location:  [facility] Procedure:      VAS US  LOWER EXTREMITY VENOUS (DVT) Referring Phys: TORIBIO PITCH --------------------------------------------------------------------------------  Indications: Edema. Other Indications: Rehab patient. Risk Factors: Fall with femoral neck fracture on 01/18/2024 s/p surgical repair on 01/19/2024. Comparison Study: Previous exam on 03/07/2020 was negative for DVT Performing Technologist: Ezzie Potters RVT, RDMS  Examination Guidelines: A complete evaluation includes B-mode imaging, spectral Doppler, color Doppler, and power Doppler as needed of all accessible portions of each vessel. Bilateral testing is considered an integral part of a complete examination. Limited examinations for reoccurring indications may be performed  as noted. The reflux portion of the exam is performed with the patient in reverse Trendelenburg.  +---------+---------------+---------+-----------+----------+--------------+ RIGHT    CompressibilityPhasicitySpontaneityPropertiesThrombus Aging +---------+---------------+---------+-----------+----------+--------------+ CFV      Full           No       Yes                                 +---------+---------------+---------+-----------+----------+--------------+ SFJ      Full                                                         +---------+---------------+---------+-----------+----------+--------------+ FV Prox  Full           No       Yes                                 +---------+---------------+---------+-----------+----------+--------------+ FV Mid   Full           No       Yes                                 +---------+---------------+---------+-----------+----------+--------------+ FV DistalFull           No       Yes                                 +---------+---------------+---------+-----------+----------+--------------+ PFV      Full                                                        +---------+---------------+---------+-----------+----------+--------------+ POP      Full           No       Yes                                 +---------+---------------+---------+-----------+----------+--------------+ PTV      Full                                                        +---------+---------------+---------+-----------+----------+--------------+ PERO     Full                                                        +---------+---------------+---------+-----------+----------+--------------+   +---------+---------------+---------+-----------+----------+--------------+ LEFT     CompressibilityPhasicitySpontaneityPropertiesThrombus Aging +---------+---------------+---------+-----------+----------+--------------+ CFV      Full           Yes      Yes                                 +---------+---------------+---------+-----------+----------+--------------+  SFJ      Full                                                        +---------+---------------+---------+-----------+----------+--------------+ FV Prox  Full           Yes      Yes                                 +---------+---------------+---------+-----------+----------+--------------+ FV Mid   Full           Yes      Yes                                  +---------+---------------+---------+-----------+----------+--------------+ FV DistalFull           Yes      Yes                                 +---------+---------------+---------+-----------+----------+--------------+ PFV      Full                                                        +---------+---------------+---------+-----------+----------+--------------+ POP      Full           Yes      Yes                                 +---------+---------------+---------+-----------+----------+--------------+ PTV      Full                                                        +---------+---------------+---------+-----------+----------+--------------+ PERO     Full                                                        +---------+---------------+---------+-----------+----------+--------------+     Summary: BILATERAL: - No evidence of deep vein thrombosis seen in the lower extremities, bilaterally. -No evidence of popliteal cyst, bilaterally.   *See table(s) above for measurements and observations. Electronically signed by [name] Serve on 02/01/2024 at 4:04:34 PM.    Final    Medications:   aspirin  EC  325 mg Oral Daily   cholecalciferol   2,000 Units Oral Daily   docusate sodium   100 mg Oral BID   metoprolol  succinate  25 mg Oral Daily   paricalcitol   4.5 mcg Intravenous Q T,Th,Sa-HD   sucroferric oxyhydroxide  1,000 mg Oral TID WC    Dialysis Orders: [facility] ED[facility] 54 kg. 3.5 hours. 2K/2.5 calcium . AVG flow rates: 400/600. Heparin : None. Meds: EPO 6200  units every treatment, Venofer, Zemplar  4.5 mcg every treatment. Binders: Auryxia  3 tabs 3 times daily AC.     Assessment/Plan:  ESRD  - TTS schedule; tolerated HD on Tues with 1.4L UF but alarming of the machine (will give trial with heparin ). Not getting heparin  as outpt. - Next HD today; walking the halls, tolerating PT.   Closed femoral fracture - Fall while riding home from HD on bus - S/p surgery 1/16 -  Ongoing PT/OT   Volume/ hypertension  -  BP decent, no edema on exam - Leaving under EDW here, will lower on discharge   Anemia of Chronic Kidney Disease - Hgb 9.9 - Allergy to Aranesp , transfuse prn   Secondary Hyperparathyroidism/Hyperphosphatemia - CorrCa ok, Phos high - continue Auryxia  as binder (coming down slowly) Will repeat P Sun AM   Dispo - CIR  "

## 2024-02-04 LAB — BASIC METABOLIC PANEL WITH GFR
Anion gap: 8 (ref 5–15)
BUN: 19 mg/dL (ref 8–23)
CO2: 30 mmol/L (ref 22–32)
Calcium: 8.4 mg/dL — ABNORMAL LOW (ref 8.9–10.3)
Chloride: 96 mmol/L — ABNORMAL LOW (ref 98–111)
Creatinine, Ser: 4.07 mg/dL — ABNORMAL HIGH (ref 0.44–1.00)
GFR, Estimated: 11 mL/min — ABNORMAL LOW
Glucose, Bld: 74 mg/dL (ref 70–99)
Potassium: 3.4 mmol/L — ABNORMAL LOW (ref 3.5–5.1)
Sodium: 135 mmol/L (ref 135–145)

## 2024-02-04 LAB — PHOSPHORUS: Phosphorus: 3.2 mg/dL (ref 2.5–4.6)

## 2024-02-04 MED ORDER — ASPIRIN 81 MG PO TBEC
81.0000 mg | DELAYED_RELEASE_TABLET | Freq: Every day | ORAL | Status: AC
  Administered 2024-02-04 – 2024-02-09 (×6): 81 mg via ORAL
  Filled 2024-02-04 (×6): qty 1

## 2024-02-04 NOTE — Progress Notes (Signed)
 " [facility] Progress Note   Subjective:   Reports she is a little out of breath after moving around but tolerating PT, walking hallways. Denies f/c/n/v. She had minor cramping with HD treatment past week and felt weak afterwards but overall tolerating well  Objective Vitals:   02/03/24 2005 02/03/24 2036 02/04/24 0349 02/04/24 0508  BP: (!) 151/70 (!) 157/79 (!) 106/53   Pulse: 83 95 85   Resp: (!) 21 (!) 32 17   Temp:   97.6 F (36.4 C)   TempSrc:   Oral   SpO2: 100% 100% 100%   Weight:    56 kg  Height:       Physical Exam General: Well appearing, NAD.  Heart: RRR Lungs: CTA anteriorly Abdomen: soft Extremities: no LE edema Dialysis Access: LUE AVG +t/b  Additional Objective Labs: Basic Metabolic Panel: Recent Labs  Lab 01/30/24 1340 02/04/24 0524  NA 132* 135  K 5.5* 3.4*  CL 93* 96*  CO2 28 30  GLUCOSE 103* 74  BUN 45* 19  CREATININE 7.71* 4.07*  CALCIUM  8.9 8.4*  PHOS 5.8* 3.2   Liver Function Tests: Recent Labs  Lab 01/30/24 1340  ALBUMIN  3.2*   No results for input(s): LIPASE, AMYLASE in the last 168 hours. CBC: Recent Labs  Lab 01/30/24 1340  WBC 6.4  HGB 9.4*  HCT 29.8*  MCV 95.8  PLT 190   Blood Culture    Component Value Date/Time   SDES BLOOD RIGHT ARM 01/01/2024 1316   SDES BLOOD RIGHT ARM 01/01/2024 1316   SPECREQUEST  01/01/2024 1316    BOTTLES DRAWN AEROBIC ONLY Blood Culture adequate volume   SPECREQUEST  01/01/2024 1316    BOTTLES DRAWN AEROBIC ONLY Blood Culture adequate volume   CULT  01/01/2024 1316    NO GROWTH 5 DAYS Performed at [facility], [address] [place], [place] [address]    CULT  01/01/2024 1316    NO GROWTH 5 DAYS Performed at [facility], [address] [place], [place] [address]    REPTSTATUS 01/06/2024 FINAL 01/01/2024 1316   REPTSTATUS 01/06/2024 FINAL 01/01/2024 1316    Cardiac Enzymes: No results for input(s): CKTOTAL, CKMB, CKMBINDEX, TROPONINI in  the last 168 hours. CBG: No results for input(s): GLUCAP in the last 168 hours. Iron  Studies: No results for input(s): IRON , TIBC, TRANSFERRIN, FERRITIN in the last 72 hours. @lablastinr3 @ Studies/Results: No results found.  Medications:   aspirin  EC  325 mg Oral Daily   cholecalciferol   2,000 Units Oral Daily   docusate sodium   100 mg Oral BID   metoprolol  succinate  25 mg Oral Daily   paricalcitol   4.5 mcg Intravenous Q T,Th,Sa-HD   sucroferric oxyhydroxide  1,000 mg Oral TID WC    Dialysis Orders: [facility] ED[facility] 54 kg. 3.5 hours. 2K/2.5 calcium . AVG flow rates: 400/600. Heparin : None. Meds: EPO 6200 units every treatment, Venofer, Zemplar  4.5 mcg every treatment. Binders: Auryxia  3 tabs 3 times daily AC.     Assessment/Plan:  ESRD  - TTS schedule; tolerated HD on Tues with 1.4L UF but alarming of the machine (will give trial with heparin ). Not getting heparin  as outpt. - Tolerated HD Sat with net UF 1.5L; walking the halls, tolerating PT. - Plan next HD Tues, no absolute indication today.   Closed femoral fracture - Fall while riding home from HD on bus - S/p surgery 1/16 - Ongoing PT/OT   Volume/ hypertension  -  BP  decent, no edema on exam - Leaving under EDW here, will lower on discharge   Anemia of Chronic Kidney Disease - Hgb 9.9 - Allergy to Aranesp , transfuse prn   Secondary Hyperparathyroidism/Hyperphosphatemia - CorrCa ok, Phos high - continue Auryxia  as binder (coming down slowly) Repeat P 3.2 2/1   Dispo - CIR  "

## 2024-02-04 NOTE — Progress Notes (Signed)
 "                                                        PROGRESS NOTE   Subjective/Complaints: Does not want to take 325mg  aspirin - prefers 81mg  as she took at home. 325 was likely started for clot prevention but patient is ambulating 150 feet so will change to 81  ROS: Patient denies new vision changes, dizziness, nausea, vomiting, diarrhea,  shortness of breath or chest pain, headache, or mood change. + low grade fever- improved +left hip discomfort    Objective:   No results found.  No results for input(s): WBC, HGB, HCT, PLT in the last 72 hours.  Recent Labs    02/04/24 0524  NA 135  K 3.4*  CL 96*  CO2 30  GLUCOSE 74  BUN 19  CREATININE 4.07*  CALCIUM  8.4*     Intake/Output Summary (Last 24 hours) at 02/04/2024 0948 Last data filed at 02/04/2024 0914 Gross per 24 hour  Intake 340 ml  Output 1500 ml  Net -1160 ml        Physical Exam: Vital Signs Blood pressure (!) 106/53, pulse 85, temperature 97.6 F (36.4 C), temperature source Oral, resp. rate 17, height 5' 3 (1.6 m), weight 56 kg, SpO2 100%.  General: No apparent distress, appears comfortable sitting in WC HEENT: Head is normocephalic, atraumatic, sclera anicteric, oral mucosa moist Neck: Supple without JVD or lymphadenopathy Heart: Reg rate and rhythm. No murmurs rubs or gallops Chest: CTA bilaterally without wheezes, rales, or rhonchi; no distress, on RA Abdomen: Soft, non-tender, non-distended, bowel sounds positive. 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    Assessment/Plan: 1. Functional deficits which require 3+ hours per day of interdisciplinary therapy in a comprehensive inpatient rehab setting. Physiatrist is providing close team supervision and 24 hour management of active medical problems listed below. Physiatrist and rehab team continue to assess barriers to discharge/monitor patient progress toward functional and medical goals  Care Tool:  Bathing    Body parts bathed by patient: Right arm, Left arm, Chest, Left lower leg, Abdomen, Front perineal area, Face, Buttocks, Right upper leg, Left upper leg, Right lower leg         Bathing assist Assist Level: Supervision/Verbal cueing     Upper Body Dressing/Undressing Upper body dressing   What is the patient wearing?: Pull over shirt    Upper body assist Assist Level: Independent    Lower Body Dressing/Undressing Lower body dressing      What is the patient wearing?: Underwear/pull up, Pants     Lower body assist Assist for lower body dressing: Contact Guard/Touching assist     Toileting Toileting    Toileting assist Assist for toileting: Minimal Assistance - Patient > 75%     Transfers Chair/bed transfer  Transfers assist     Chair/bed transfer assist level:  Supervision/Verbal cueing     Locomotion Ambulation   Ambulation assist      Assist level: Supervision/Verbal cueing Assistive device: Walker-rolling Max distance: 150'   Walk 10 feet activity   Assist     Assist level: Supervision/Verbal cueing Assistive device: Walker-rolling   Walk 50 feet activity   Assist Walk 50 feet with 2 turns activity did not occur: Safety/medical concerns (pain/weakness/fatigue)  Assist level: Supervision/Verbal cueing Assistive device: Walker-rolling    Walk 150 feet activity   Assist Walk 150 feet activity did not occur: Safety/medical concerns (pain/weakness/fatigue)  Assist level: Supervision/Verbal cueing Assistive device:  Walker-rolling    Walk 10 feet on uneven surface  activity   Assist Walk 10 feet on uneven surfaces activity did not occur: Safety/medical concerns (pain/weakness/fatigue)         Wheelchair     Assist Is the patient using a wheelchair?: Yes Type of Wheelchair: Manual    Wheelchair assist level: Dependent - Patient 0% Max wheelchair distance: 150 ft    Wheelchair 50 feet with 2 turns activity    Assist        Assist Level: Dependent - Patient 0%   Wheelchair 150 feet activity     Assist      Assist Level: Dependent - Patient 0%   Blood pressure (!) 106/53, pulse 85, temperature 97.6 F (36.4 C), temperature source Oral, resp. rate 17, height 5' 3 (1.6 m), weight 56 kg, SpO2 100%.  Medical Problem List and Plan: 1. Functional deficits secondary to  L femoral neck fx s/p Dr Kendal per fixation 01/19/24             -Per Ortho (1/19)patient may shower and can get incision wet, do not submerge.              -ELOS/Goals: 12-14 days- supervision            -Continue CIR  - Expected discharge tentatively 2/5   2.  Antithrombotics: -DVT/anticoagulation:  Mechanical:  Antiembolism stockings, knee (TED hose) Bilateral lower extremities Sequential compression devices, below knee Bilateral lower extremities             -antiplatelet therapy: Aspirin -changed to 81mg  daily as per patient's request- she is ambulating 150 feet             - Vas US  Doppler pending- neg for DVT 3. Pain Management: As needed Vicodin and Tylenol   - 1/30 pain well-controlled.  Not using frequent medications. 4. Mood/Behavior/Sleep: LCSW to follow for evaluation and support when available.              -antipsychotic agents: N/A 5. Neuropsych/cognition: This patient is capable of making decisions on her own behalf. 6. Skin/Wound Care: Routine pressure relief measures.  Change incisional dressing as needed. ?Staple remove at 2 weeks- sounds like 1/31?.  7. Fluids/Electrolytes/Nutrition:  Monitor strict I&O and weight. Follow up labs per HD. - Moderate malnutrition: BMI 20.35 kg/m  -1/30 discontinue multivitamin and zinc  sulfate per patient request.  She has been declining these 8.  Close left femoral fracture: S/p percutaneous fixation of left femoral neck on 1/16.  Outpatient follow-up with Dr. Kendal in 2 weeks.             - WBAT LLE, continue PT. 9.  ESRD: On hemodialysis, TTS. Oliguric.  Continue HD per schedule.   -Mild hyperkalemia.  Continue binders and Auryxia . Monitor volume status and labs.     -1/28 reviewed nephrology note, Lokelma  ordered today for  hyperkalemia. -1/29 reviewed nephrology note, has HD today 1/30 reviewed nephrology note next hemodialysis tomorrow The ferric citrate  discussed with nephrology yesterday she can  chew it tho or break into 1/4's or dissolve in water  Discussed that she would like phosphate binder d/ced- she has been refusing because it causes her to feel nauseous, discussed that she has been minimizing dietary phosphate intake and recent level is wnl, d/ced binder as per patient request  10.  Essential HTN/chronic HFrEF: Euvolemic, BP stable.  Continue Toprol  25 mg daily. Weight reviewed and is stable  11.  Chronic hypoxic respiratory failure: Currently stable on 2 L O2 via nasal cannula (baseline). Weight reviewed and is decreased  12.  Cough: r/t recent pneumonia. Continue prn, Tessalon  and Tussionex suspension.  Encourage incentive spirometer.  - 1/29 reports this has improved continue to monitor  1/30 stop tessalon - pt declines to take, will change Mucinex  to as needed  13.  Vitamin D  deficiency: continue vitamin D3 Supp.  14.  Low-grade temp: 100.5 degrees, may need to repeat respiratory viral panel.   -1/28 yesterday Temperature 100.1.  No signs or symptoms of infection noted.  WBC not elevated.  Continue to monitor  -1/30 improved, patient reports she feels well.  Continue to monitor  15.  CAD: Continue Toprol -XL and  aspirin .  16. Anemia of CKD  -1/28 HGB 9.4, continue to monitor  - 1/29 patient asked about her iron  supplementation.  Her ferritin 1493 and saturation 33.  Nephrology notes indicate Venofer, however does not look like she is getting needed.  Discussed with Dr. Melia -ferric citrate  is being used for binder.  Notified him she is declining these due to size of the pills. (See #9)  17. Hyperkalemia due to ESRD  - Nephrology following, HD tomorrow      LOS: 5 days A FACE TO FACE EVALUATION WAS PERFORMED  Sven SQUIBB Koden Hunzeker 02/04/2024, 9:48 AM     "

## 2024-02-04 NOTE — Progress Notes (Incomplete)
 Occupational Therapy Session Note  Patient Details  Name: Barbara Crawford MRN: [id number] Date of Birth: 1955/10/09  Today's Date: 02/05/2024 OT Individual Time: 1349-1459 OT Individual Time Calculation (min): 70 min    Short Term Goals: Week 1:  OT Short Term Goal 1 (Week 1): STG=LTG d.t elos  Skilled Therapeutic Interventions/Progress Updates:  Pt greeted seated in w/c, pt agreeable to OT intervention.      Transfers/bed mobility/functional mobility: pt completed functional ambulation with RW with supervision greater than a household distance ( ~ 150 ft).   Pt reports concern about how to carry her oxygen  concentrator and personal bag when she foes to OP HD now that she is on a RW. Pt able to demo ability to ambulate ~ 20 ft while holding 4 lb wrist weight on shoulder to simulate oxygen  concentrator strap with CGA. Problem solved how to transport her personal bag with best option being to don a reusable bag as walker bag on front of walker to transport her personal blankets/snacks to HD. ( Pt reports the facility does not provide the patients with blankets). Will continue to discuss/problem solve during future OT sessions.   Pt completed 2 bouts of stair training ( up/down 4 steps) with close supervision with holding onto R rail. Pt needed seated rest break in between but seemed to be pleased with her progress.   Exercises: pt completed below therex to facilitate improved LB strength for stair training with BUE support in 4 inch steps:  X10 step ups with BUE support  X10 toe taps with BUE support   Vitals: pt on RA during session, monitored O2 during session, pt did have one moment of SpO2= 86% but able to rebound to > 90% within 30 secs.                  Ended session with pt seated in w/c with all needs within reach.   Therapy Documentation Precautions:  Precautions Precautions: Fall Recall of Precautions/Restrictions: Intact Precaution/Restrictions Comments: reminders to  elevate LLE Restrictions Weight Bearing Restrictions Per Provider Order: Yes LLE Weight Bearing Per Provider Order: Weight bearing as tolerated  Pain: No pain indicated   Therapy/Group: Individual Therapy  Ronal Mallie Needy 02/05/2024, 2:58 PM

## 2024-02-04 NOTE — Progress Notes (Signed)
 Physical Therapy Session Note  Patient Details  Name: Barbara Crawford MRN: [id number] Date of Birth: 1955-10-08  Today's Date: 02/04/2024 PT Individual Time: 0803-0900 PT Individual Time Calculation (min): 57 min   Today's Date: 02/04/2024 PT Individual Time: 1101-1200 PT Individual Time Calculation (min): 59 min   Short Term Goals: Week 1:  PT Short Term Goal 1 (Week 1): STG=LTG due to ELOS  Skilled Therapeutic Interventions/Progress Updates:     Treatment 1: Pt seated EOB upon arrival. Pt denies pain a agreeable to therapy. Session emphasized functional strengthening, cardiopulmonary and muscular endurance, and activity tolerance with ambulation. At start of session PT used therapeutic use of self as pt became tearful regarding anxiety, current level of function, fears about going home, and burden to others once she goes home as she will need assistance. Pt expressed appreciation for time taken to talk through these things. Pt amb ~152 ft from room to main gym and ~60 ft distance back to toward room using RW with S. Seated rest break required between bouts due to fatigue. Maintained SpO2 94-98% without supplemental O2 during session. Once back in room, pt remained seated in [facility] with all needs in reach at end of session.  Treatment 2: Pt seated in WC napping upon arrival. Pt denies pain and agreeable to therapy. Session emphasized functional strengthening, cardiopulmonary and muscular endurance, and activity tolerance with stair negotiation. Pt transported dependent in [facility] to main gym for time/energy conservation. Pt asc/desc 8-6 inch steps using L HR with CGA and ++++ time. Pt then asc/desc 4-6 inch steps same assist. Pt reported being too fatigued to do another 4 steps. Maintained SpO2 97-98% during activity without supplemental O2. Once back in room, pt remained seated in WC, B LE elevated, with all needs in reach at end of session.  Therapy Documentation Precautions:   Precautions Precautions: Fall Recall of Precautions/Restrictions: Intact Precaution/Restrictions Comments: reminders to elevate LLE Restrictions Weight Bearing Restrictions Per Provider Order: Yes LLE Weight Bearing Per Provider Order: Weight bearing as tolerated  Therapy/Group: Individual Therapy  [name], PT, DPT 02/04/2024, 7:49 AM

## 2024-02-04 NOTE — Progress Notes (Signed)
 Occupational Therapy Session Note  Patient Details  Name: [name] MRN: [id number] Date of Birth: Jun 18, 1955  Today's Date: 02/04/2024 OT Individual Time: 1415-1530 OT Individual Time Calculation (min): 75 min    Short Term Goals: Week 1:  OT Short Term Goal 1 (Week 1): STG=LTG d.t elos  Skilled Therapeutic Interventions/Progress Updates:      Therapy Documentation Precautions:  Precautions Precautions: Fall Recall of Precautions/Restrictions: Intact Precaution/Restrictions Comments: reminders to elevate LLE Restrictions Weight Bearing Restrictions Per Provider Order: Yes LLE Weight Bearing Per Provider Order: Weight bearing as tolerated General: Pt seated in W/C upon OT arrival, agreeable to OT.  Pain: 5/10 pain reported in RLE, activity, intermittent rest breaks, distractions provided for pain management, pt reports tolerable to proceed.   Exercises: Pt completed the following exercise circuit in order to improve functional activity, strength and endurance to prepare for ADLs such as bathing. Pt completed the following exercises in seated position with no noted LOB/SOB and 3x10 repetitions on each exercise with yellow theraband: -knee flexion -knee extension -clam shells  Other Treatments: OT and pt discussing D/C and independence levels/pt expectations. OT discussing goals for therapies and home set up and how pt gets to dialysis. OT educating pt ways for increased independence and modifications for safety once home.    Pt seated in W/C at end of session with W/C alarm donned, call light within reach and 4Ps assessed.    Therapy/Group: Individual Therapy  Camie Hoe, OTD, OTR/L 02/04/2024, 3:51 PM

## 2024-02-05 ENCOUNTER — Encounter: Admitting: Dietician

## 2024-02-05 DIAGNOSIS — E876 Hypokalemia: Secondary | ICD-10-CM

## 2024-02-05 NOTE — Progress Notes (Signed)
 Patient ID: Barbara Crawford, female   DOB: 03-16-1955, 69 y.o.   MRN: [id number]  Spoke with the patient. Will reach out to the dialysis social worker to see if she can change dialysis days - she reports that the Saturday transportation provided by [employer] is unhelpful and are limited on how much assistance they can provide unlike the [employer] provided during the week.   SW will also reach out to her daughter to see if she can participate in family education as well once dialysis days are confirmed  A more appropriate discharge date can be confirmed once all of this is done.

## 2024-02-06 LAB — BASIC METABOLIC PANEL WITH GFR
Anion gap: 12 (ref 5–15)
BUN: 49 mg/dL — ABNORMAL HIGH (ref 8–23)
CO2: 28 mmol/L (ref 22–32)
Calcium: 8.7 mg/dL — ABNORMAL LOW (ref 8.9–10.3)
Chloride: 91 mmol/L — ABNORMAL LOW (ref 98–111)
Creatinine, Ser: 8.4 mg/dL — ABNORMAL HIGH (ref 0.44–1.00)
GFR, Estimated: 5 mL/min — ABNORMAL LOW
Glucose, Bld: 96 mg/dL (ref 70–99)
Potassium: 4.3 mmol/L (ref 3.5–5.1)
Sodium: 131 mmol/L — ABNORMAL LOW (ref 135–145)

## 2024-02-06 LAB — CBC WITH DIFFERENTIAL/PLATELET
Abs Immature Granulocytes: 0.03 10*3/uL (ref 0.00–0.07)
Basophils Absolute: 0.1 10*3/uL (ref 0.0–0.1)
Basophils Relative: 1 %
Eosinophils Absolute: 0.2 10*3/uL (ref 0.0–0.5)
Eosinophils Relative: 3 %
HCT: 25.7 % — ABNORMAL LOW (ref 36.0–46.0)
Hemoglobin: 8.1 g/dL — ABNORMAL LOW (ref 12.0–15.0)
Immature Granulocytes: 1 %
Lymphocytes Relative: 16 %
Lymphs Abs: 0.8 10*3/uL (ref 0.7–4.0)
MCH: 29.9 pg (ref 26.0–34.0)
MCHC: 31.5 g/dL (ref 30.0–36.0)
MCV: 94.8 fL (ref 80.0–100.0)
Monocytes Absolute: 0.9 10*3/uL (ref 0.1–1.0)
Monocytes Relative: 18 %
Neutro Abs: 3 10*3/uL (ref 1.7–7.7)
Neutrophils Relative %: 61 %
Platelets: 195 10*3/uL (ref 150–400)
RBC: 2.71 MIL/uL — ABNORMAL LOW (ref 3.87–5.11)
RDW: 14.5 % (ref 11.5–15.5)
WBC: 4.9 10*3/uL (ref 4.0–10.5)
nRBC: 0 % (ref 0.0–0.2)

## 2024-02-06 NOTE — Progress Notes (Signed)
 Patient ID: Barbara Crawford, female   DOB: 1955-06-01, 69 y.o.   MRN: [id number]  Verified with the [facility] and patient and they will stick with the original dialysis days of Tuesday, Thursday, Saturday. [facility] will come in tomorrow morning for family education with the understanding of a discharge date of 02/06 Friday.

## 2024-02-06 NOTE — Plan of Care (Signed)
" °  Problem: Consults Goal: RH GENERAL PATIENT EDUCATION Description: See Patient Education module for education specifics. Outcome: Progressing   Problem: RH BOWEL ELIMINATION Goal: RH STG MANAGE BOWEL WITH ASSISTANCE Description: STG Manage Bowel with mod I Assistance. Outcome: Progressing Goal: RH STG MANAGE BOWEL W/MEDICATION W/ASSISTANCE Description: STG Manage Bowel with Medication with mod I Assistance. Outcome: Progressing   Problem: RH SAFETY Goal: RH STG ADHERE TO SAFETY PRECAUTIONS W/ASSISTANCE/DEVICE Description: STG Adhere to Safety Precautions With cues Assistance/Device. Outcome: Progressing   Problem: RH SAFETY Goal: RH STG ADHERE TO SAFETY PRECAUTIONS W/ASSISTANCE/DEVICE Description: STG Adhere to Safety Precautions With cues Assistance/Device. Outcome: Progressing   Problem: RH PAIN MANAGEMENT Goal: RH STG PAIN MANAGED AT OR BELOW PT'S PAIN GOAL Description: Pain < 4 with prns Outcome: Progressing   Problem: RH KNOWLEDGE DEFICIT GENERAL Goal: RH STG INCREASE KNOWLEDGE OF SELF CARE AFTER HOSPITALIZATION Description: Patient will be able to manage care at discharge using educational resources for medications and safety independently Outcome: Progressing   "

## 2024-02-06 NOTE — Progress Notes (Signed)
 Contacted by CSW that pt/family would like pt to keep her TTS HD appt as prior to admission. Contacted [facility] and spoke to Tonya, Consulting Civil Engineer. RN aware that pt wants to keep TTS appt. Pt has an 11:30 am arrival for 11:50 am chair time. This info provided to CSW and will add to AVS as well. Clinic also aware to expect pt to resume care on Sat, Feb 7. Will assist as needed.   Randine Mungo Dialysis Navigator [phone]

## 2024-02-06 NOTE — Progress Notes (Signed)
 Occupational Therapy Session Note  Patient Details  Name: Hatsuko Bizzarro MRN: [id number] Date of Birth: 09-22-1955  Today's Date: 02/06/2024 OT Individual Time: 0700-0759 OT Individual Time Calculation (min): 59 min    Short Term Goals: Week 1:  OT Short Term Goal 1 (Week 1): STG=LTG d.t elos  Skilled Therapeutic Interventions/Progress Updates:   Patient agreeable to participate in OT session. Reports 0/10 pain level.   Patient participated in skilled OT session focusing on bathing and dressing. Patient received at [facility] ready for OT. Discussed D/C plans, dialysis scheduling, patient fears, and progress estimates and barriers with patient. Patient able to transfer into bathroom with RW with sup A. Patient then able to complete bathing with distant SUP level A. Patient  required set up of towels for drying. Patient then sup transfer back to bed. Able to complete all grooming/ dressing with SUP level to mod I with good safety awareness. OT utilized therapeutic use of self to decrease patient fear of D/C with lack of trust in hip. Patient left in room in wc all needs in reach alarm on.    Therapy Documentation Precautions:  Precautions Precautions: Fall Recall of Precautions/Restrictions: Intact Precaution/Restrictions Comments: reminders to elevate LLE Restrictions Weight Bearing Restrictions Per Provider Order: Yes LLE Weight Bearing Per Provider Order: Weight bearing as tolerated  Therapy/Group: Individual Therapy  D'mariea L Gratia Disla 02/06/2024, 7:40 AM

## 2024-02-07 ENCOUNTER — Ambulatory Visit (HOSPITAL_COMMUNITY): Admitting: Cardiology

## 2024-02-07 LAB — IRON AND TIBC
Iron: 39 ug/dL (ref 28–170)
Saturation Ratios: 20 % (ref 10.4–31.8)
TIBC: 200 ug/dL — ABNORMAL LOW (ref 250–450)
UIBC: 161 ug/dL

## 2024-02-07 LAB — FERRITIN: Ferritin: 1239 ng/mL — ABNORMAL HIGH (ref 11–307)

## 2024-02-07 NOTE — Patient Care Conference (Signed)
 Inpatient RehabilitationTeam Conference and Plan of Care Update Date: 02/07/2024   Time: 9:51 AM    Patient Name: Barbara Crawford      Medical Record Number: [id number]  Date of Birth: [birth year] Sex: Female         Room/Bed: 4W18C/4W18C-01 Payor Info: Payor: [employer]UE [employer]UE [employer] MEDICARE / Plan: BCBS MEDICARE / Product Type: *No Product type* /    Admit Date/Time:  01/30/2024  6:34 PM  Primary Diagnosis:  Displaced fracture of left femoral neck [facility])  Hospital Problems: Principal Problem:   Displaced fracture of left femoral neck (HCC) Active Problems:   Normocytic anemia   Uremia   Essential hypertension   Cough   ESRD (end stage renal disease) (HCC)   Acute systolic CHF (congestive heart failure) (HCC)   Closed left femoral fracture (HCC)   Chronic hypoxic respiratory failure (HCC)   HFrEF (heart failure with reduced ejection fraction) (HCC)   History of CAD (coronary artery disease)   Generalized anxiety disorder   Hypokalemia   Dependence on renal dialysis   Hypertensive heart and chronic kidney disease with heart failure and with stage 5 chronic kidney disease, or end stage renal disease (HCC)   Long term (current) use of aspirin     Expected Discharge Date: Expected Discharge Date: 02/09/24  Team Members Present: Physician leading conference: Dr. Murray Collier Social Worker Present: Waverly Gentry, LCSW-A Nurse Present: Barnie Ronde, RN PT Present: Izetta Seip, PT OT Present: Michaelyn Seip, OT PPS Coordinator present : Eleanor Colon, SLP     Current Status/Progress Goal Weekly Team Focus  Bowel/Bladder      Continent of bowel; Anuria with ESRD, HD t, H, Sa          Swallow/Nutrition/ Hydration               ADL's   SUP A to mod I,   SUP TO MOD I   barrier: endurance, anxiety, stairs. d/c 2/6    Mobility   S for bed mob, amb, transfers, CGA/++ time for stairs   mod I for transfers; S for amb with RW; CGA for stairs  working on endurance  and confidence with amb and stair negotiation (biggest barrier - lives on second floor apt with L HR); DC 2/6    Communication                Safety/Cognition/ Behavioral Observations               Pain      Fistula pain; managed with tylenol     Pain < 4 with prns    Monitor site; referral to nephrology  Skin     N/a            Discharge Planning:    Per PMR, plans to discharge home with daughter who will take FMLA as needed. Will await therapy follow-up recommendations.   Team Discussion: Patient admitted post fall on the transport bus with displaced left femur fracture. Limited by ESRD; HD T, H, Sa and Chronic Respiratory Failure on supplemental oxygen  2L/Min [place] PTA, pain with mobility and balance deficits.   Patient on target to meet rehab goals: yes, currently needs supervision for mobility and CGA for stair navigation. Needs supervision for ADLs. Goals for discharge set for supervision overall, mod I for transfers and CGA for stair navigation.  *See Care Plan and progress notes for long and short-term goals.   Revisions to Treatment Plan:  N/a   Teaching  Needs: Safety, medications, transfers, toileting, etc.   Current Barriers to Discharge: Decreased caregiver support, Home enviroment access/layout, and Hemodialysis  Possible Resolutions to Barriers: Family education     Medical Summary Current Status: hip fx, ESRD, anemia, hypokalemia, pain at fistula, low grade fever  Barriers to Discharge: Electrolyte abnormality;Medical stability;Renal Insufficiency/Failure;Self-care education  Barriers to Discharge Comments: hip fx, ESRD, anemia, hypokalemia, pain at fistula, low grade fever Possible Resolutions to [employer] Focus: stool OB test, possible fistulogram, monitor CBC   Continued Need for Acute Rehabilitation Level of Care: The patient requires daily medical management by a physician with specialized training in physical medicine and rehabilitation  for the following reasons: Direction of a multidisciplinary physical rehabilitation program to maximize functional independence : Yes Medical management of patient stability for increased activity during participation in an intensive rehabilitation regime.: Yes Analysis of laboratory values and/or radiology reports with any subsequent need for medication adjustment and/or medical intervention. : Yes   I attest that I was present, lead the team conference, and concur with the assessment and plan of the team.   Fredericka Sober B 02/07/2024, 2:42 PM

## 2024-02-07 NOTE — Progress Notes (Signed)
 Occupational Therapy Session Note  Patient Details  Name: Barbara Crawford MRN: [id number] Date of Birth: 08-10-1955  Session 1: Today's Date: 02/07/2024 OT Individual Time: 1000-1100 OT Individual Time Calculation (min): 60 min   Session 2: Today's Date: 02/07/2024 OT Individual Time: 8599-8546 OT Individual Time Calculation (min): 53 min    Short Term Goals: Week 1:  OT Short Term Goal 1 (Week 1): STG=LTG d.t elos  Session 1: Skilled Therapeutic Interventions/Progress Updates:  Patient agreeable to participate in OT session. Reports 0/10 pain level, however some complaints of discomfort in dialysis port. Patient received in chair ready for OT. Family training rescheduled for later this afternoon with OT due to scheduling error. Patient requested to complete bathing session today. Patient completed clothing gathering with increased time mod I. Patient overall SUP for bathing. Patient mod I for all dressing tasks and short transfers. SUP to mod I for functional mobility and dynamic balance. Upon finishing bathing patient able to dry and return to bed for dressing mod I with increased time. Patient discussed with Dr patient arm soreness, discussed with social worker d/c plan and medical necessity letter for apartment change to increase IND. Patient able to complete all dressing at EOB mod I. Patient left in room all needs in reach .[name]    Session 2: Skilled Therapeutic Interventions/Progress Updates:  Patient received in room ready for OT. Patient scheduled for caregiver training today however caregiver not available during time. Patient and OT discussed, home stairs with patient requesting to trial. Patient set up with OT requiring assist for oxygen  tank management, which patient will not have going home. Patient able to complete 11 steps up and down utilizing good leg first up, bad leg down one rail assist to mimic home with CG and increased time. Patient rested as she would at home  before coming down. Patient demonstrated increased anxiety during stair training. Patient returned to room following all needs in reach.    Therapy Documentation Precautions:  Precautions Precautions: Fall Recall of Precautions/Restrictions: Intact Precaution/Restrictions Comments: reminders to elevate LLE Restrictions Weight Bearing Restrictions Per Provider Order: Yes LLE Weight Bearing Per Provider Order: Weight bearing as tolerated  Therapy/Group: Individual Therapy  [name] 02/07/2024, 8:10 AM

## 2024-02-07 NOTE — Progress Notes (Signed)
 Physical Therapy Session Note  Patient Details  Name: Barbara Crawford MRN: [id number] Date of Birth: 1955/06/13  Today's Date: 02/07/2024 PT Individual Time: 1332-1400 PT Individual Time Calculation (min): 28 min   Short Term Goals: Week 1:  PT Short Term Goal 1 (Week 1): STG=LTG due to ELOS  Skilled Therapeutic Interventions/Progress Updates:     Pt seated in [facility] upon arrival. Pt denies pain and agreeable to work with PT for an additional session. Pt agreeable to chair level exercises to establish HEP. Pt instructed in and performed the following seated B LE exercises: 3x10 heel/toe raises, 3x10 alt LAQ with DF, 3x10 alt marching, 2x10 hip abduction with GTB. PT provided pt with illustrated handout for exercises. Pt verbalized understanding. Pt remained seated in WC at end of session with all needs in reach.  Therapy Documentation Precautions:  Precautions Precautions: Fall Recall of Precautions/Restrictions: Intact Precaution/Restrictions Comments: reminders to elevate LLE Restrictions Weight Bearing Restrictions Per Provider Order: Yes LLE Weight Bearing Per Provider Order: Weight bearing as tolerated  Therapy/Group: Individual Therapy  [name], PT, DPT 02/07/2024, 1:40 PM

## 2024-02-07 NOTE — Progress Notes (Signed)
 Patient ID: Barbara Crawford, female   DOB: 1955-12-25, 69 y.o.   MRN: [id number]  Have reviewed team conference with pt and family. Both aware and agreeable with targeted d/c date of 02/06 and goals of Independent with assistive device.

## 2024-02-07 NOTE — Progress Notes (Incomplete)
 Occupational Therapy Discharge Summary  Patient Details  Name: Barbara Crawford MRN: [id number] Date of Birth: 07-Feb-1955  Date of Discharge from OT service:February 08, 2024   Patient has met 10 of 10 long term goals due to improved activity tolerance, improved balance, and ability to compensate for deficits.  Patient to discharge at overall Modified Independent level.  Patient's care partner is independent to provide the necessary physical assistance at discharge.    Reasons goals not met: n/a  Recommendation:  Patient will benefit from ongoing skilled OT services in home health setting to continue to advance functional skills in the area of BADL, iADL, and Reduce care partner burden.  Equipment: No equipment provided  Reasons for discharge: treatment goals met and discharge from hospital  Patient/family agrees with progress made and goals achieved: Yes  OT Discharge Precautions/Restrictions  Precautions Precautions: Fall Recall of Precautions/Restrictions: Intact Restrictions Weight Bearing Restrictions Per Provider Order: Yes LLE Weight Bearing Per Provider Order: Weight bearing as tolerated General   Vital Signs   Pain Pain Assessment Pain Scale: 0-10 Pain Score: 0-No pain ADL ADL Equipment Provided: Reacher Eating: Modified independent Where Assessed-Eating: Edge of bed, Wheelchair Grooming: Modified independent Where Assessed-Grooming: Sitting at sink Upper Body Bathing: Modified independent Where Assessed-Upper Body Bathing: Shower Lower Body Bathing: Supervision/safety, Modified independent Where Assessed-Lower Body Bathing: Shower Upper Body Dressing: Modified independent (Device) Where Assessed-Upper Body Dressing: Edge of bed, Wheelchair Lower Body Dressing: Modified independent Where Assessed-Lower Body Dressing: Wheelchair, Edge of bed Toileting: Modified independent Where Assessed-Toileting: Teacher, Adult Education: Modified independent, Close  supervision Toilet Transfer Method: Stand pivot, Proofreader: Raised toilet seat Film/video Editor: Modified independent, Close supervision Film/video Editor Method: Stand pivot, Manufacturing Systems Engineer with back Vision Baseline Vision/History: 1 Wears glasses Patient Visual Report: No change from baseline Vision Assessment?: No apparent visual deficits;Vision impaired- to be further tested in functional context Perception  Perception: Within Functional Limits Praxis Praxis: WFL Cognition Cognition Overall Cognitive Status: Within Functional Limits for tasks assessed Arousal/Alertness: Awake/alert Memory: Appears intact Problem Solving: Appears intact Safety/Judgment: Appears intact Brief Interview for Mental Status (BIMS) Repetition of Three Words (First Attempt): 3 Temporal Orientation: Year: Correct Temporal Orientation: Month: Accurate within 5 days Temporal Orientation: Day: Correct Recall: Sock: Yes, no cue required Recall: Blue: Yes, no cue required Recall: Bed: Yes, no cue required BIMS Summary Score: 15 Sensation Sensation Light Touch: Appears Intact Proprioception: Appears Intact Coordination Fine Motor Movements are Fluid and Coordinated: Yes Motor  Motor Motor: Within Functional Limits Mobility  Bed Mobility Bed Mobility: Sit to Supine;Rolling Left;Rolling Right;Supine to Sit Rolling Right: Independent with assistive device Rolling Left: Independent with assistive device Supine to Sit: Independent with assistive device Sit to Supine: Independent with assistive device Transfers Sit to Stand: Independent with assistive device Stand to Sit: Independent with assistive device  Trunk/Postural Assessment  Cervical Assessment Cervical Assessment: Exceptions to [facility] Thoracic Assessment Thoracic Assessment: Exceptions to [facility] Lumbar Assessment Lumbar Assessment: Within Functional  Limits Postural Control Postural Control: Within Functional Limits  Balance Balance Balance Assessed: Yes Dynamic Sitting Balance Dynamic Sitting - Balance Support: Feet supported Dynamic Sitting - Level of Assistance: 6: Modified independent (Device/Increase time) Sitting balance - Comments: EOB without UE, no instabillity Static Standing Balance Static Standing - Balance Support: Right upper extremity supported;Left upper extremity supported Static Standing - Level of Assistance: 6: Modified independent (Device/Increase time) Dynamic Standing Balance Dynamic Standing - Balance Support: Right upper extremity supported;Left upper  extremity supported Dynamic Standing - Level of Assistance: 6: Modified independent (Device/Increase time) Extremity/Trunk Assessment RUE Assessment RUE Assessment: Within Functional Limits Active Range of Motion (AROM) Comments: WFL LUE Assessment LUE Assessment: Exceptions to [facility] Active Range of Motion (AROM) Comments: previous rotator cuff- decreased ROM 80 degrees active flexion   [name] 02/07/2024, 10:53 AM

## 2024-02-07 NOTE — Progress Notes (Signed)
 Physical Therapy Session Note  Patient Details  Name: Barbara Crawford MRN: [id number] Date of Birth: 05-May-1955  Today's Date: 02/07/2024 PT Individual Time: 0802-0845 PT Individual Time Calculation (min): 43 min   Today's Date: 02/07/2024 PT Individual Time: 1100-1155 PT Individual Time Calculation (min): 55 min   Short Term Goals: Week 1:  PT Short Term Goal 1 (Week 1): STG=LTG due to ELOS  Skilled Therapeutic Interventions/Progress Updates:     Treatment 1: Pt seated EOB upon arrival. Pt denies pain but endorses L arm discomfort, and agreeable to therapy. Session emphasized functional strengthening, endurance, and activity tolerance. PT donned TEDs and shoes dependent for time management. Pt transported dependent in [facility] to/from main gym for time/energy management. Pt worked on improving strength and tolerance with standing on L LE practicing sit to stands with R LE on Airex foam without AD with light CGA. Pt required seated rest break throughout session but able to tolerate entire session without supplemental O2. Once back in room, pt remained seated in [name] with all needs in reach at end of session.  Treatment 2: Pt seated in WC donning pants upon arrival. Pt denies pain and agreeable to therapy. Pt's daughter unable to be present for family training during session - pt states there was a miscommunication between SW and her daughter. Daughter plans to be present this afternoon during second OT session. Session emphasized functional strengthening, endurance, and activity tolerance with ambulation. PT donned shoes dependent for time management. Pt stood to RW and pulled pants over hips with S. Pt amb ~150 ft from room to main gym using RW with S and ++++ time. Occasional standing rest breaks required due to fatigue. Prolonged seated rest break required after amb due to fatigue - pt requested and provided with supplemental O2 due to SOB. Pt stated she was unable to walk back to room due to  fatigue. Pt instructed in and performed the following seated B LE strengthening exercises: 3x10 alt LAQ with DF and 3x10 alt marching - tolerated ranges with L LE. Pt returned to room dependent, ice pack placed on L hip, pt remained seated in [name] with all needs in reach at end of session.  Therapy Documentation Precautions:  Precautions Precautions: Fall Recall of Precautions/Restrictions: Intact Precaution/Restrictions Comments: reminders to elevate LLE Restrictions Weight Bearing Restrictions Per Provider Order: Yes LLE Weight Bearing Per Provider Order: Weight bearing as tolerated  Therapy/Group: Individual Therapy  [name], PT, DPT 02/07/2024, 7:27 AM

## 2024-02-07 NOTE — Progress Notes (Addendum)
 "                                                        PROGRESS NOTE   Subjective/Complaints: Denies dizziness this AM. Continues to have some soreness fistula but says she tolerated dialysis yesterday.   ROS: Patient denies fevers, new vision changes, dizziness, nausea, vomiting, diarrhea,  shortness of breath or chest pain, headache, or mood change. + low grade fever- improved, tmax 149f +left hip discomfort- controlled + pain at dialysis fistula- continued, mild     Objective:   No results found.  Recent Labs    02/06/24 1533  WBC 4.9  HGB 8.1*  HCT 25.7*  PLT 195    Recent Labs    02/06/24 1533  NA 131*  K 4.3  CL 91*  CO2 28  GLUCOSE 96  BUN 49*  CREATININE 8.40*  CALCIUM  8.7*     Intake/Output Summary (Last 24 hours) at 02/07/2024 1116 Last data filed at 02/07/2024 0700 Gross per 24 hour  Intake 118 ml  Output 2000 ml  Net -1882 ml        Physical Exam: Vital Signs Blood pressure 117/60, pulse 79, temperature 98.6 F (37 C), temperature source Oral, resp. rate 17, height 5' 3 (1.6 m), weight 50.6 kg, SpO2 100%.  General: No apparent distress,  sitting on shower bench working with OT HEENT: Head is normocephalic, atraumatic, sclera anicteric, oral mucosa moist Neck: Supple without JVD or lymphadenopathy Heart: Reg rate and rhythm. No murmurs rubs or gallops Chest: CTA bilaterally without wheezes, rales, or rhonchi; no distress, on RA Abdomen: Soft, non-tender, non-distended, bowel sounds positive. 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    Assessment/Plan: 1. Functional deficits which require 3+ hours per day of interdisciplinary therapy in a comprehensive inpatient rehab setting. Physiatrist is providing close team supervision and 24 hour management of active medical problems listed below. Physiatrist and rehab team continue to assess barriers to discharge/monitor patient progress toward functional and medical goals  Care Tool:  Bathing    Body parts bathed by patient: Right arm, Left arm, Chest, Left lower leg, Abdomen, Front perineal area, Face, Buttocks, Right upper leg, Left upper leg, Right lower leg         Bathing assist Assist Level: Independent with assistive device Assistive Device Comment: tub bench/grab bar   Upper Body Dressing/Undressing Upper body dressing   What is the patient wearing?: Pull over shirt    Upper body assist Assist Level: Independent    Lower Body Dressing/Undressing Lower body dressing      What is the patient wearing?: Underwear/pull up, Pants     Lower body assist Assist for lower body dressing: Independent with assitive device     Toileting Toileting    Toileting assist Assist for toileting: Independent with assistive device  Transfers Chair/bed transfer  Transfers assist     Chair/bed transfer assist level: Independent with assistive device     Locomotion Ambulation   Ambulation assist      Assist level: Supervision/Verbal cueing Assistive device: Walker-rolling Max distance: 150'   Walk 10 feet activity   Assist     Assist level: Supervision/Verbal cueing Assistive device: Walker-rolling   Walk 50 feet activity   Assist Walk 50 feet with 2 turns activity did not occur: Safety/medical concerns (pain/weakness/fatigue)  Assist level: Supervision/Verbal cueing Assistive device: Walker-rolling    Walk 150 feet activity   Assist Walk 150 feet activity did not  occur: Safety/medical concerns (pain/weakness/fatigue)  Assist level: Supervision/Verbal cueing Assistive device: Walker-rolling    Walk 10 feet on uneven surface  activity   Assist Walk 10 feet on uneven surfaces activity did not occur: Safety/medical concerns (pain/weakness/fatigue)   Assist level: Supervision/Verbal cueing Assistive device: Walker-rolling   Wheelchair     Assist Is the patient using a wheelchair?: Yes Type of Wheelchair: Manual    Wheelchair assist level: Dependent - Patient 0% Max wheelchair distance: 150 ft    Wheelchair 50 feet with 2 turns activity    Assist        Assist Level: Dependent - Patient 0%   Wheelchair 150 feet activity     Assist      Assist Level: Dependent - Patient 0%   Blood pressure 117/60, pulse 79, temperature 98.6 F (37 C), temperature source Oral, resp. rate 17, height 5' 3 (1.6 m), weight 50.6 kg, SpO2 100%.  Medical Problem List and Plan: 1. Functional deficits secondary to  L femoral neck fx s/p Dr Kendal per fixation 01/19/24             -Per Ortho (1/19)patient may shower and can get incision wet, do not submerge.              -ELOS/Goals: 12-14 days- supervision            -Continue CIR  - Expected discharge has been adjusted 2/6, also expected with dialysis schedule  -Team conference today please see physician documentation under team conference tab, met with team  to discuss problems,progress, and goals. Formulized individual treatment plan based on medical history, underlying problem and comorbidities.     2.  Antithrombotics: -DVT/anticoagulation:  Mechanical:  Antiembolism stockings, knee (TED hose) Bilateral lower extremities Sequential compression devices, below knee Bilateral lower extremities             -antiplatelet therapy: Aspirin -changed to 81mg  daily as per patient's request- she is ambulating 150 feet             - Vas US  Doppler pending- neg for DVT 3. Pain Management: As needed  Vicodin and Tylenol   - 1/30 pain well-controlled.  Not using frequent medications. 4. Mood/Behavior/Sleep: LCSW to follow for evaluation and support when available.              -antipsychotic agents: N/A 5. Neuropsych/cognition: This patient is capable of making decisions on her own behalf. 6. Skin/Wound Care: Routine pressure relief measures.  Change incisional dressing as needed. ?Staple remove at 2 weeks- sounds like 1/31?.  7. Fluids/Electrolytes/Nutrition: Monitor strict I&O and weight. Follow up labs per HD. - Moderate malnutrition: BMI 20.35 kg/m  -1/30 discontinue multivitamin and zinc  sulfate per patient request.  She has been declining these 8.  Close left femoral fracture: S/p percutaneous fixation of left femoral neck on 1/16.  Outpatient follow-up with  Dr. Kendal in 2 weeks.             - WBAT LLE, continue PT. 9.  ESRD: On hemodialysis, TTS. Oliguric.  Continue HD per schedule.   -Mild hyperkalemia.  Continue binders and Auryxia . Monitor volume status and labs.     -1/28 reviewed nephrology note, Lokelma  ordered today for hyperkalemia. -1/29 reviewed nephrology note, has HD today 1/30 reviewed nephrology note next hemodialysis tomorrow The ferric citrate  discussed with nephrology yesterday she can  chew it tho or break into 1/4's or dissolve in water  Discussed that she would like phosphate binder d/ced- she has been refusing because it causes her to feel nauseous, discussed that she has been minimizing dietary phosphate intake and recent level is wnl, d/ced binder as per patient request- Will notify nephrology -2/2 will notify nephrology regarding mild soreness around her fistula  -2/3 patient has HD today.  Nephrology considering fistulogram if pain in this area does not improve.  -2/4 tolerated HD yesterday, continues to have some mild tenderness at AVF  10.  Essential HTN/chronic HFrEF: Euvolemic, BP stable.  Continue Toprol  25 mg daily. Weight reviewed and is  stable  11.  Chronic hypoxic respiratory failure: Currently stable on 2 L O2 via nasal cannula (baseline). Weight reviewed and is decreased  12.  Cough: r/t recent pneumonia. Continue prn, Tessalon  and Tussionex suspension.  Encourage incentive spirometer.  - 1/29 reports this has improved continue to monitor  1/30 stop tessalon - pt declines to take, will change Mucinex  to as needed  Improved  13.  Vitamin D  deficiency: continue vitamin D3 Supp.  14.  Low-grade temp: 100.5 degrees, may need to repeat respiratory viral panel.   -1/28 yesterday Temperature 100.1.  No signs or symptoms of infection noted.  WBC not elevated.  Continue to monitor  -1/30 improved, patient reports she feels well.  Continue to monitor  -2/2 will asked nephrology if needed CBC with next labs.  Fevers have improved.  -2/4 TMAX 100 yesterday, WBC WNL 4.9 yesterday   15.  CAD: Continue Toprol -XL and aspirin .  16. Anemia of CKD  -1/28 HGB 9.4, continue to monitor  - 1/29 patient asked about her iron  supplementation.  Her ferritin 1493 and saturation 33.  Nephrology notes indicate Venofer, however does not look like she is getting needed.  Discussed with Dr. Melia -ferric citrate  is being used for binder.  Notified him she is declining these due to size of the pills. (See #9)  -2/3 will monitor repeat labs with dialysis-as scheduled for today  -2/4 HGB appears lower at 8.1 , Stool OB test ordered  17. Hyperkalemia due to ESRD  - 2/4 K+ stable 4.3 yesterday     LOS: 8 days A FACE TO FACE EVALUATION WAS PERFORMED  Murray Collier 02/07/2024, 11:16 AM     "

## 2024-02-07 NOTE — Progress Notes (Addendum)
 Patient ID: Barbara Crawford, female   DOB: 17-Oct-1955, 69 y.o.   MRN: [id number]  Have reviewed team conference with pt and family. Both aware and agreeable with targeted d/c date of 02/06 and goals of Supervision. Barbara Crawford will participate in family education this afternoon instead of this morning.  Medical Necessity Letter provided.

## 2024-02-07 NOTE — Progress Notes (Signed)
 " [facility] Progress Note   Subjective:   Seen in room. Completed dialysis yesterday - net UF 2L. Does endorse DOE after walking with therapy.  LUE soreness improving. No issues using AVG.   Objective Vitals:   02/06/24 1824 02/06/24 1949 02/07/24 0455 02/07/24 1335  BP: (!) 143/68 (!) 140/68 117/60 (!) 124/58  Pulse: 97 (!) 104 79 87  Resp: (!) 22 20 17    Temp: 98.6 F (37 C) 100 F (37.8 C) 98.6 F (37 C) 98.8 F (37.1 C)  TempSrc:  Oral Oral Oral  SpO2: 100% 99% 100% 100%  Weight: 50.6 kg     Height:       Physical Exam General: Well appearing, NAD.  Heart: RRR Lungs: CTA anteriorly Abdomen: soft Extremities: no LE edema Dialysis Access: LUE AVG +t/b  Additional Objective Labs: Basic Metabolic Panel: Recent Labs  Lab 02/04/24 0524 02/06/24 1533  NA 135 131*  K 3.4* 4.3  CL 96* 91*  CO2 30 28  GLUCOSE 74 96  BUN 19 49*  CREATININE 4.07* 8.40*  CALCIUM  8.4* 8.7*  PHOS 3.2  --    Liver Function Tests: No results for input(s): AST, ALT, ALKPHOS, BILITOT, PROT, ALBUMIN  in the last 168 hours.  No results for input(s): LIPASE, AMYLASE in the last 168 hours. CBC: Recent Labs  Lab 02/06/24 1533  WBC 4.9  NEUTROABS 3.0  HGB 8.1*  HCT 25.7*  MCV 94.8  PLT 195   Blood Culture    Component Value Date/Time   SDES BLOOD RIGHT ARM 01/01/2024 1316   SDES BLOOD RIGHT ARM 01/01/2024 1316   SPECREQUEST  01/01/2024 1316    BOTTLES DRAWN AEROBIC ONLY Blood Culture adequate volume   SPECREQUEST  01/01/2024 1316    BOTTLES DRAWN AEROBIC ONLY Blood Culture adequate volume   CULT  01/01/2024 1316    NO GROWTH 5 DAYS Performed at [facility], [address], [place], [place] [address]    CULT  01/01/2024 1316    NO GROWTH 5 DAYS Performed at [facility], [address] [place], [place] [address]    REPTSTATUS 01/06/2024 FINAL 01/01/2024 1316   REPTSTATUS 01/06/2024 FINAL 01/01/2024 1316    Cardiac Enzymes: No  results for input(s): CKTOTAL, CKMB, CKMBINDEX, TROPONINI in the last 168 hours. CBG: No results for input(s): GLUCAP in the last 168 hours. Iron  Studies: No results for input(s): IRON , TIBC, TRANSFERRIN, FERRITIN in the last 72 hours. @lablastinr3 @ Studies/Results: No results found.  Medications:   aspirin  EC  81 mg Oral Daily   cholecalciferol   2,000 Units Oral Daily   docusate sodium   100 mg Oral BID   metoprolol  succinate  25 mg Oral Daily   paricalcitol   4.5 mcg Intravenous Q T,Th,Sa-HD    Dialysis Orders: [facility] T[facility] EDW 54 kg. 3.5 hours. 2K/2.5 calcium . AVG flow rates: 400/600. Heparin : None. Meds: EPO 6200 units every treatment, Venofer, Zemplar  4.5 mcg every treatment. Binders: Auryxia  3 tabs 3 times daily AC.     Assessment/Plan:  ESRD  - TTS schedule; tolerated HD on Tues with 1.4L UF but alarming of the machine (will give trial with heparin ). Not getting heparin  as outpt. - Next HD Thurs -LUE pain. Started Mon. AVG looks ok. Consider fistulogram if not improving. Better today.   Closed femoral fracture - Fall while riding home from HD on bus - S/p surgery 1/16 - Ongoing PT/OT   Volume/ hypertension  -  BP decent, no edema on  exam - Leaving under EDW here, will lower on discharge   Anemia of Chronic Kidney Disease - Hgb 9.9 >8.1. Check iron  studies.  - Allergy to Aranesp , transfuse prn   Secondary Hyperparathyroidism/Hyperphosphatemia - CorrCa ok, Phos high -Auryxia  as binder (coming down slowly) - Binder stopped due to nausea.  Phos  3.2 on  2/1  Dispo - CIR - Anticipated discharge 2/6.   Barbara Ronnald Acosta PA-C  [facility] 02/07/2024,3:20 PM  "

## 2024-02-07 NOTE — Progress Notes (Incomplete)
 Inpatient Rehabilitation Discharge Medication Review by a Pharmacist  A complete drug regimen review was completed for this patient to identify any potential clinically significant medication issues.  High Risk Drug Classes Is patient taking? Indication by Medication  Antipsychotic No   Anticoagulant No   Antibiotic No   Opioid Yes  Norco- acute pain   Antiplatelet Yes Aspirin - cva ppx  Hypoglycemics/insulin  No   Vasoactive Medication Yes Toprol - HTN/HFrEF  Chemotherapy No   Other Yes Zemplar - CKD, secondary hyperparathyroid Guafenesin, tessalon  pearls - expectorant, cough  VitD, MVI- supplement  PEG, docusate- constipation      Type of Medication Issue Identified Description of Issue Recommendation(s)  Drug Interaction(s) (clinically significant)     Duplicate Therapy     Allergy     No Medication Administration End Date     Incorrect Dose     Additional Drug Therapy Needed     Significant med changes from prior encounter (inform family/care partners about these prior to discharge). PTA meds discontinued- auryxia  d/t inability to tolerate    Other       Clinically significant medication issues were identified that warrant physician communication and completion of prescribed/recommended actions by midnight of the next day:  No   Time spent performing this drug regimen review (minutes):  30   Massie Fila, PharmD Clinical Pharmacist  02/07/2024 3:00 PM

## 2024-02-08 ENCOUNTER — Other Ambulatory Visit (HOSPITAL_COMMUNITY): Payer: Self-pay

## 2024-02-08 DIAGNOSIS — M79602 Pain in left arm: Secondary | ICD-10-CM

## 2024-02-08 DIAGNOSIS — R195 Other fecal abnormalities: Secondary | ICD-10-CM

## 2024-02-08 LAB — RENAL FUNCTION PANEL
Albumin: 2.9 g/dL — ABNORMAL LOW (ref 3.5–5.0)
Anion gap: 8 (ref 5–15)
BUN: 37 mg/dL — ABNORMAL HIGH (ref 8–23)
CO2: 31 mmol/L (ref 22–32)
Calcium: 9 mg/dL (ref 8.9–10.3)
Chloride: 95 mmol/L — ABNORMAL LOW (ref 98–111)
Creatinine, Ser: 7.15 mg/dL — ABNORMAL HIGH (ref 0.44–1.00)
GFR, Estimated: 6 mL/min — ABNORMAL LOW
Glucose, Bld: 82 mg/dL (ref 70–99)
Phosphorus: 4.4 mg/dL (ref 2.5–4.6)
Potassium: 4.9 mmol/L (ref 3.5–5.1)
Sodium: 134 mmol/L — ABNORMAL LOW (ref 135–145)

## 2024-02-08 LAB — CBC
HCT: 25.8 % — ABNORMAL LOW (ref 36.0–46.0)
Hemoglobin: 7.9 g/dL — ABNORMAL LOW (ref 12.0–15.0)
MCH: 29.9 pg (ref 26.0–34.0)
MCHC: 30.6 g/dL (ref 30.0–36.0)
MCV: 97.7 fL (ref 80.0–100.0)
Platelets: 194 10*3/uL (ref 150–400)
RBC: 2.64 MIL/uL — ABNORMAL LOW (ref 3.87–5.11)
RDW: 14.6 % (ref 11.5–15.5)
WBC: 4.4 10*3/uL (ref 4.0–10.5)
nRBC: 0 % (ref 0.0–0.2)

## 2024-02-08 LAB — OCCULT BLOOD X 1 CARD TO LAB, STOOL: Fecal Occult Bld: POSITIVE — AB

## 2024-02-08 MED ORDER — HEPARIN SODIUM (PORCINE) 1000 UNIT/ML DIALYSIS: 20.0000 [IU]/kg | INTRAMUSCULAR | Status: DC | PRN

## 2024-02-08 MED ORDER — METHOCARBAMOL 500 MG PO TABS
500.0000 mg | ORAL_TABLET | Freq: Four times a day (QID) | ORAL | 0 refills | Status: AC | PRN
  Filled 2024-02-08: qty 30, 8d supply, fill #0

## 2024-02-08 MED ORDER — ACETAMINOPHEN 325 MG PO TABS: 325.0000 mg | ORAL_TABLET | Freq: Four times a day (QID) | ORAL | Status: AC | PRN

## 2024-02-08 MED ORDER — ASPIRIN 81 MG PO TBEC
81.0000 mg | DELAYED_RELEASE_TABLET | Freq: Every day | ORAL | 0 refills | Status: AC
  Filled 2024-02-08: qty 30, 30d supply, fill #0

## 2024-02-08 MED ORDER — GUAIFENESIN ER 600 MG PO TB12: 600.0000 mg | ORAL_TABLET | Freq: Two times a day (BID) | ORAL | Status: AC | PRN

## 2024-02-08 MED ORDER — HYDROCODONE-ACETAMINOPHEN 5-325 MG PO TABS
1.0000 | ORAL_TABLET | Freq: Four times a day (QID) | ORAL | 0 refills | Status: AC | PRN
  Filled 2024-02-08: qty 30, 4d supply, fill #0

## 2024-02-08 MED ORDER — FERRIC CITRATE 1 GM 210 MG(FE) PO TABS
210.0000 mg | ORAL_TABLET | ORAL | 0 refills | Status: AC
  Filled 2024-02-08: qty 270, 30d supply, fill #0
  Filled 2024-02-08: qty 270, fill #0

## 2024-02-08 NOTE — Progress Notes (Signed)
 Occupational Therapy Session Note  Patient Details  Name: Barbara Crawford MRN: [id number] Date of Birth: [birth year]  Today's Date: 02/08/2024 OT Individual Time: 9066-8954 OT Individual Time Calculation (min): 72 min    Short Term Goals: Week 1:  OT Short Term Goal 1 (Week 1): STG=LTG d.t elos  Skilled Therapeutic Interventions/Progress Updates:  Pt greeted seated EOB, pt reports being anxious about DC, provided active listening and emotional support, pt agreeable to OT intervention.      Transfers/bed mobility/functional mobility:  Pt completed sit<>stands MODI, pt completed functional ambulation in room with RW MODI.   ADLs:  Grooming: pt donned lotion from chair MODI  UB dressing:pt donned OH shirt MODI  LB dressing: pt donned underwear and pants with reacher MODI.   Footwear: pt reports using sock aid at home, pt able to don socks with sock aid with increased time and min cues for technique but overall supervision.   Bathing: pt completed bathing seated on TTB MODI  Transfers: ambulatory toilet and shower transfer MODI with Rw.  Toileting: ambulatory toilet transfer with RW MODI, continent b/b void.     Education:  Education provided on potentially leaving a reacher in bathroom to bring pants back to waist line during toileting and to remove socks prior to showering. Pt receptive.   Pt asking how to transport O2 concentrator, referred pt back to previous OT session with this therapist but did offer alternative solution on trying to use rollator in order to be transport needed items for HD.                   Ended session with pt seated in w/c with all needs within reach.  Therapy Documentation Precautions:  Precautions Precautions: Fall Recall of Precautions/Restrictions: Intact Precaution/Restrictions Comments: reminders to elevate LLE Restrictions Weight Bearing Restrictions Per Provider Order: Yes LLE Weight Bearing Per Provider Order: Weight bearing as  tolerated  Pain: No pain    Therapy/Group: Individual Therapy  Ronal Gift [facility] 02/08/2024, 12:04 PM

## 2024-02-08 NOTE — Plan of Care (Signed)
" °  Problem: RH Pre-functional/Other (Specify) Goal: RH LTG PT (Specify) 1 Description:  RH LTG PT (Specify) 1 Flowsheets (Taken 02/08/2024 1543) LTG: Other PT (Specify) 1: Pt will score > or = 41/56 on berg balance scale indicating reduced fall risk with static and dynamic balance activities   "

## 2024-02-08 NOTE — Progress Notes (Signed)
 Physical Therapy Discharge Summary  Patient Details  Name: Barbara Crawford MRN: [id number] Date of Birth: 1955/07/14  Date of Discharge from PT service:February 08, 2024   Patient has met 8 of 8 long term goals due to improved activity tolerance, improved balance, increased strength, increased range of motion, decreased pain, and ability to compensate for deficits.  Patient to discharge at an ambulatory level Supervision.   Patient's care partner is independent to provide the necessary physical assistance at discharge.  Recommendation:  Patient will benefit from ongoing skilled PT services in home health setting to continue to advance safe functional mobility, address ongoing impairments in strength, ROM, endurance, balance, stair negotiation, and minimize fall risk.  Equipment: No equipment provided  Reasons for discharge: treatment goals met and discharge from hospital  Patient/family agrees with progress made and goals achieved: Yes  PT Discharge Precautions/Restrictions Precautions Precautions: Fall Recall of Precautions/Restrictions: Intact Restrictions Weight Bearing Restrictions Per Provider Order: Yes LLE Weight Bearing Per Provider Order: Weight bearing as tolerated Pain Interference Pain Interference Pain Effect on Sleep: 2. Occasionally Pain Interference with Therapy Activities: 1. Rarely or not at all Pain Interference with Day-to-Day Activities: 1. Rarely or not at all Vision/Perception  Vision - History Ability to See in Adequate Light: 0 Adequate Perception Perception: Within Functional Limits Praxis Praxis: WFL  Cognition Overall Cognitive Status: Within Functional Limits for tasks assessed Arousal/Alertness: Awake/alert Memory: Appears intact Awareness: Appears intact Problem Solving: Appears intact Safety/Judgment: Appears intact Sensation Sensation Light Touch: Appears Intact Proprioception: Appears Intact Coordination Gross Motor Movements  are Fluid and Coordinated: No Motor  Motor Motor: Within Functional Limits  Mobility Bed Mobility Bed Mobility: Sit to Supine;Rolling Left;Rolling Right;Supine to Sit Rolling Right: Independent with assistive device Rolling Left: Independent with assistive device Supine to Sit: Independent with assistive device Sit to Supine: Independent with assistive device Transfers Transfers: Sit to Stand;Stand to Sit;Stand Pivot Transfers Sit to Stand: Independent with assistive device Stand to Sit: Independent with assistive device Stand Pivot Transfers: Independent with assistive device Stand Pivot Transfer Details: Verbal cues for safe use of DME/AE;Verbal cues for technique Transfer (Assistive device): Rolling walker Locomotion  Gait Ambulation: Yes Gait Assistance: Supervision/Verbal cueing (++ time) Gait Distance (Feet): 150 Feet Assistive device: Rolling walker Gait Gait: Yes Gait Pattern: Impaired Gait Pattern: Step-to pattern;Decreased step length - left;Decreased step length - right;Decreased stance time - left;Decreased stride length;Antalgic;Decreased weight shift to left Gait velocity: decreased Stairs / Additional Locomotion Stairs: Yes Stairs Assistance: Supervision/Verbal cueing;Contact Guard/Touching assist Stair Management Technique: One rail Left Number of Stairs: 12 Height of Stairs: 6 Ramp: Supervision/Verbal cueing Curb: Contact Guard/Touching assist Wheelchair Mobility Wheelchair Mobility: No  Trunk/Postural Assessment  Cervical Assessment Cervical Assessment: Exceptions to [facility] Thoracic Assessment Thoracic Assessment: Exceptions to [facility] Lumbar Assessment Lumbar Assessment: Within Functional Limits Postural Control Postural Control: Within Functional Limits  Balance Balance Balance Assessed: Yes Dynamic Sitting Balance Dynamic Sitting - Balance Support: Feet supported Dynamic Sitting - Level of Assistance: 6: Modified independent (Device/Increase  time) Static Standing Balance Static Standing - Balance Support: Right upper extremity supported;Left upper extremity supported Static Standing - Level of Assistance: 6: Modified independent (Device/Increase time) Dynamic Standing Balance Dynamic Standing - Balance Support: Right upper extremity supported;Left upper extremity supported Dynamic Standing - Level of Assistance: 6: Modified independent (Device/Increase time) Extremity Assessment  RLE Assessment RLE Assessment: Within Functional Limits LLE Assessment General Strength Comments: DF/PF 4/5, knee grossly 4+/5, hip grossly 3+/5   [name], PT,  DPT 02/08/2024, 8:30 AM

## 2024-02-08 NOTE — Plan of Care (Signed)

## 2024-02-08 NOTE — Progress Notes (Addendum)
 Patient ID: Barbara Crawford, female   DOB: 02-17-1955, 69 y.o.   MRN: [id number]  Followed up with patient today and she is still very anxious about discharging tomorrow. Amit reports that her daughter was sick which is why she didn't make it to family education at 2pm as planned. SW followed up with Tiara to discuss an alternate plan as she works. SW awaiting response.  SW inquired about patient's son, [place]. Naveya reports that son works as a librarian, academic be able to come after work due to his children.  Patient may appeal her discharge as she reports feelings of anxiousness and not feeling quite ready to return home.

## 2024-02-08 NOTE — Progress Notes (Addendum)
 "                                                        PROGRESS NOTE   Subjective/Complaints: No new medical concerns today. She is anxious about discharge tomorrow, may not have family that can pick her up and assist her until Sunday.   ROS: Patient denies fevers, new vision changes, dizziness, nausea, vomiting, diarrhea,  shortness of breath or chest pain, headache, or mood change. + low grade fever- improved, tmax 132f +left hip discomfort- controlled + pain at dialysis fistula- continued, mild     Objective:   No results found.  Recent Labs    02/06/24 1533 02/08/24 0656  WBC 4.9 4.4  HGB 8.1* 7.9*  HCT 25.7* 25.8*  PLT 195 194    Recent Labs    02/06/24 1533 02/08/24 0656  NA 131* 134*  K 4.3 4.9  CL 91* 95*  CO2 28 31  GLUCOSE 96 82  BUN 49* 37*  CREATININE 8.40* 7.15*  CALCIUM  8.7* 9.0    No intake or output data in the 24 hours ending 02/08/24 0844       Physical Exam: Vital Signs Blood pressure 128/66, pulse 85, temperature 98.8 F (37.1 C), temperature source Oral, resp. rate 17, height 5' 3 (1.6 m), weight 50.6 kg, SpO2 100%.  General: No apparent distress, sitting in WC HEENT: Head is normocephalic, atraumatic, sclera anicteric, oral mucosa moist Neck: Supple without JVD or lymphadenopathy Heart: Reg rate and rhythm. No murmurs rubs or gallops Chest: CTA bilaterally without wheezes, rales, or rhonchi; no distress, on RA Abdomen: Soft, non-tender, non-distended, bowel sounds positive. 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    Assessment/Plan: 1. Functional deficits which require 3+ hours per day of interdisciplinary therapy in a comprehensive inpatient rehab setting. Physiatrist is providing close team supervision and 24 hour management of active medical problems listed below. Physiatrist and rehab team continue to assess barriers to discharge/monitor patient progress toward functional and medical goals  Care Tool:  Bathing    Body parts bathed by patient: Right arm, Left arm, Chest, Left lower leg, Abdomen, Front perineal area, Face, Buttocks, Right upper leg, Left upper leg, Right lower leg         Bathing assist Assist Level: Independent with assistive device Assistive Device Comment: tub bench/grab bar   Upper Body Dressing/Undressing Upper body dressing   What is the patient wearing?: Pull over shirt    Upper body assist Assist Level: Independent    Lower Body Dressing/Undressing Lower body dressing      What is the patient wearing?: Underwear/pull up, Pants     Lower body assist Assist for lower body dressing: Independent with assitive device     Toileting Toileting    Toileting assist Assist for toileting: Independent with assistive  device     Transfers Chair/bed transfer  Transfers assist     Chair/bed transfer assist level: Independent with assistive device Chair/bed transfer assistive device: Arboriculturist assist      Assist level: Supervision/Verbal cueing Assistive device: Walker-rolling Max distance: 150 ft   Walk 10 feet activity   Assist     Assist level: Supervision/Verbal cueing Assistive device: Walker-rolling   Walk 50 feet activity   Assist Walk 50 feet with 2 turns activity did not occur: Safety/medical concerns (pain/weakness/fatigue)  Assist level: Supervision/Verbal cueing Assistive device: Walker-rolling    Walk 150 feet  activity   Assist Walk 150 feet activity did not occur: Safety/medical concerns (pain/weakness/fatigue)  Assist level: Supervision/Verbal cueing Assistive device: Walker-rolling    Walk 10 feet on uneven surface  activity   Assist Walk 10 feet on uneven surfaces activity did not occur: Safety/medical concerns (pain/weakness/fatigue)   Assist level: Contact Guard/Touching assist Assistive device: Walker-rolling   Wheelchair     Assist Is the patient using a wheelchair?: No Type of Wheelchair: Manual    Wheelchair assist level: Dependent - Patient 0% Max wheelchair distance: 150 ft    Wheelchair 50 feet with 2 turns activity    Assist        Assist Level: Dependent - Patient 0%   Wheelchair 150 feet activity     Assist      Assist Level: Dependent - Patient 0%   Blood pressure 128/66, pulse 85, temperature 98.8 F (37.1 C), temperature source Oral, resp. rate 17, height 5' 3 (1.6 m), weight 50.6 kg, SpO2 100%.  Medical Problem List and Plan: 1. Functional deficits secondary to  L femoral neck fx s/p Dr Kendal per fixation 01/19/24             -Per Ortho (1/19)patient may shower and can get incision wet, do not submerge.              -ELOS/Goals: 12-14 days- supervision            -Continue CIR  - Expected discharge has been adjusted 2/6, also expected with dialysis schedule  -Will discuss discharge date with team, SW also f/u with family regarding the DC date    2.  Antithrombotics: -DVT/anticoagulation:  Mechanical:  Antiembolism stockings, knee (TED hose) Bilateral lower extremities Sequential compression devices, below knee Bilateral lower extremities             -antiplatelet therapy: Aspirin -changed to 81mg  daily as per patient's request- she is ambulating 150 feet             - Vas US  Doppler pending- neg for DVT 3. Pain Management: As needed Vicodin and Tylenol   - 1/30 pain well-controlled.  Not using frequent medications. 4.  Mood/Behavior/Sleep: LCSW to follow for evaluation and support when available.              -antipsychotic agents: N/A 5. Neuropsych/cognition: This patient is capable of making decisions on her own behalf. 6. Skin/Wound Care: Routine pressure relief measures.  Change incisional dressing as needed. ?Staple remove at 2 weeks- sounds like 1/31?.  7. Fluids/Electrolytes/Nutrition: Monitor strict I&O and weight. Follow up labs per HD. - Moderate malnutrition: BMI 20.35 kg/m  -1/30 discontinue multivitamin and zinc  sulfate per patient request.  She has been declining these 8.  Close left femoral fracture: S/p percutaneous fixation of left femoral neck on 1/16.  Outpatient follow-up with Dr. Kendal in 2 weeks.             -  WBAT LLE, continue PT. 9.  ESRD: On hemodialysis, TTS. Oliguric.  Continue HD per schedule.   -Mild hyperkalemia.  Continue binders and Auryxia . Monitor volume status and labs.     -1/28 reviewed nephrology note, Lokelma  ordered today for hyperkalemia. -1/29 reviewed nephrology note, has HD today 1/30 reviewed nephrology note next hemodialysis tomorrow The ferric citrate  discussed with nephrology yesterday she can  chew it tho or break into 1/4's or dissolve in water  Discussed that she would like phosphate binder d/ced- she has been refusing because it causes her to feel nauseous, discussed that she has been minimizing dietary phosphate intake and recent level is wnl, d/ced binder as per patient request- Will notify nephrology -2/2 will notify nephrology regarding mild soreness around her fistula  -2/3 patient has HD today.  Nephrology considering fistulogram if pain in this area does not improve.  -2/4 tolerated HD yesterday, continues to have some mild tenderness at AVF  -2/5 Will discuss with nephrology regarding if fistulogram needed before discharge  10.  Essential HTN/chronic HFrEF: Euvolemic, BP stable.  Continue Toprol  25 mg daily. Weight reviewed and is stable  11.   Chronic hypoxic respiratory failure: Currently stable on 2 L O2 via nasal cannula (baseline). Weight reviewed and is decreased  12.  Cough: r/t recent pneumonia. Continue prn, Tessalon  and Tussionex suspension.  Encourage incentive spirometer.  - 1/29 reports this has improved continue to monitor  1/30 stop tessalon - pt declines to take, will change Mucinex  to as needed  Improved  13.  Vitamin D  deficiency: continue vitamin D3 Supp.  14.  Low-grade temp: 100.5 degrees, may need to repeat respiratory viral panel.   -1/28 yesterday Temperature 100.1.  No signs or symptoms of infection noted.  WBC not elevated.  Continue to monitor  -1/30 improved, patient reports she feels well.  Continue to monitor  -2/2 will asked nephrology if needed CBC with next labs.  Fevers have improved.  -2/4 TMAX 100 yesterday, WBC WNL 4.9 yesterday  -2/5 afebrile, continue to monitor.  WBC not elevated     15.  CAD: Continue Toprol -XL and aspirin .  16. Anemia of CKD  -1/28 HGB 9.4, continue to monitor  - 1/29 patient asked about her iron  supplementation.  Her ferritin 1493 and saturation 33.  Nephrology notes indicate Venofer, however does not look like she is getting needed.  Discussed with Dr. Melia -ferric citrate  is being used for binder.  Notified him she is declining these due to size of the pills. (See #9)  -2/3 will monitor repeat labs with dialysis-as scheduled for today  -2/4 HGB appears lower at 8.1 , Stool OB test ordered  -2/5 hemoglobin slightly lower at 7.9.  Do not see FOBT done today but she did have a prior 1 that was positive right before admission.  Will consult GI  17. Hyperkalemia due to ESRD  - 2/5 K+ stable 4.9     LOS: 9 days A FACE TO FACE EVALUATION WAS PERFORMED  Murray Collier 02/08/2024, 8:44 AM     "

## 2024-02-08 NOTE — Progress Notes (Signed)
 " [facility] Progress Note   Subjective:    Seen and examined patient on HD. Tolerating UFG 1L and BP is 134/66. She tells me she is scheduled to be discharged tomorrow but is anxious about leaving. She reports she doesn't have transportation to get home.    Objective Vitals:   02/08/24 1424 02/08/24 1434 02/08/24 1445 02/08/24 1500  BP: 134/64 134/64 135/68 123/71  Pulse: (!) 12 89 89 89  Resp: 12  (!) 25 (!) 24  Temp: 98 F (36.7 C)     TempSrc:      SpO2:  96% 100% 100%  Weight:      Height:       Physical Exam General: Well appearing, NAD.  Heart: RRR Lungs: CTA anteriorly Abdomen: soft Extremities: no LE edema Dialysis Access: LUE AVG +t/b  Filed Weights   02/04/24 0508 02/06/24 1458 02/06/24 1824  Weight: 56 kg (S) 54.2 kg 50.6 kg    Intake/Output Summary (Last 24 hours) at 02/08/2024 1519 Last data filed at 02/08/2024 0800 Gross per 24 hour  Intake 240 ml  Output --  Net 240 ml    Additional Objective Labs: Basic Metabolic Panel: Recent Labs  Lab 02/04/24 0524 02/06/24 1533 02/08/24 0656  NA 135 131* 134*  K 3.4* 4.3 4.9  CL 96* 91* 95*  CO2 30 28 31   GLUCOSE 74 96 82  BUN 19 49* 37*  CREATININE 4.07* 8.40* 7.15*  CALCIUM  8.4* 8.7* 9.0  PHOS 3.2  --  4.4   Liver Function Tests: Recent Labs  Lab 02/08/24 0656  ALBUMIN  2.9*   No results for input(s): LIPASE, AMYLASE in the last 168 hours. CBC: Recent Labs  Lab 02/06/24 1533 02/08/24 0656  WBC 4.9 4.4  NEUTROABS 3.0  --   HGB 8.1* 7.9*  HCT 25.7* 25.8*  MCV 94.8 97.7  PLT 195 194   Blood Culture    Component Value Date/Time   SDES BLOOD RIGHT ARM 01/01/2024 1316   SDES BLOOD RIGHT ARM 01/01/2024 1316   SPECREQUEST  01/01/2024 1316    BOTTLES DRAWN AEROBIC ONLY Blood Culture adequate volume   SPECREQUEST  01/01/2024 1316    BOTTLES DRAWN AEROBIC ONLY Blood Culture adequate volume   CULT  01/01/2024 1316    NO GROWTH 5 DAYS Performed at [facility],  [address], [place], [place] [address]    CULT  01/01/2024 1316    NO GROWTH 5 DAYS Performed at [facility], [address] [place], [place] [address]    REPTSTATUS 01/06/2024 FINAL 01/01/2024 1316   REPTSTATUS 01/06/2024 FINAL 01/01/2024 1316    Cardiac Enzymes: No results for input(s): CKTOTAL, CKMB, CKMBINDEX, TROPONINI in the last 168 hours. CBG: No results for input(s): GLUCAP in the last 168 hours. Iron  Studies:  Recent Labs    02/07/24 1613  IRON  39  TIBC 200*  FERRITIN 1,239*   Lab Results  Component Value Date   INR 1.1 10/27/2023   INR 1.2 03/05/2020   INR 1.0 02/26/2020   Studies/Results: No results found.  Medications:   aspirin  EC  81 mg Oral Daily   cholecalciferol   2,000 Units Oral Daily   docusate sodium   100 mg Oral BID   metoprolol  succinate  25 mg Oral Daily   paricalcitol   4.5 mcg Intravenous Q T,Th,Sa-HD    Dialysis Orders: [facility] EDW 54 kg. 3.5 hours. 2K/2.5 calcium . AVG flow rates: 400/600. Heparin : None. Meds: EPO 6200 units  every treatment, Venofer, Zemplar  4.5 mcg every treatment. Binders: Auryxia  3 tabs 3 times daily AC.   Assessment/Plan:  ESRD  - TTS schedule; tolerated HD on Tues with 1.4L UF but alarming of the machine (will give trial with heparin ). Not getting heparin  as outpt. - On HD -LUE pain. Started Mon. AVG looks ok. Consider fistulogram if not improving. Seems okay today on HD-machine is running well and she currently denies pain.   Closed femoral fracture - Fall while riding home from HD on bus - S/p surgery 1/16 - Ongoing PT/OT   Volume/ hypertension  -  BP decent, no edema on exam - Leaving under EDW here, will lower on discharge   Anemia of Chronic Kidney Disease - Hgb 9.9 >8.1. Check iron  studies.  - Allergy to Aranesp , transfuse prn   Secondary Hyperparathyroidism/Hyperphosphatemia - CorrCa ok, Phos high -Auryxia  as binder (coming down slowly) - Binder stopped due to nausea.  Phos   3.2 on  2/1   Dispo - CIR - Anticipated discharge 2/6 but she reports feeling anxious about upcoming discharge. She reports not having transportation for tomorrow. I sent the rehab MD a secure message on this issue.  Charmaine Piety, NP [facility] 02/08/2024,3:19 PM  LOS: 9 days    "

## 2024-02-08 NOTE — Progress Notes (Signed)
 Spoke to RN at [facility] early this morning. RN advised that plans remain for pt to d/c tomorrow and resume care on Saturday. Should that change for any reason, navigator will provide update to clinic.   Randine Mungo Dialysis Navigator [phone]

## 2024-02-08 NOTE — Consult Note (Signed)
 "                                               Consultation Note   Referring Provider:   [facility] PCP: Tammy Tari Barbara Crawford Primary Gastroenterologist:   Sampson       Reason for Consultation:  Anemia, FOBT+ DOA: 01/30/2024         Hospital Day: 10   Assessment::  # 69 year old female with multiple comorbidities including chronic anemia.  Now with mild progression of anemia in addition to Hemoccult positive stool.  No overt bleeding other than scant occasional rectal bleeding with constipation.   Hemoglobin has drifted during this prolonged admission from baseline of mid 10 down to 7.9 in the absence of any overt GI bleeding but with a positive hemoccult late January.   # ESRD on HD TTS  # Chronic hypoxic respiratory failure on O2  # HFrEF 30 to 35%  # Hypertension  # See PMH for any additional medical history  / medical problems   Plan: --Her anemia is chronic, she gets IV infusions it sounds. However, anemia seems to have progressed, stools are heme positive so endoscopic evaluation is not unreasonable though she is at increased risk with all of her co-morbidities. We briefly discussed. Dr. Leigh will talk more about it with her.   Principal Problem:   Displaced fracture of left femoral neck (HCC) Active Problems:   Normocytic anemia   Uremia   Essential hypertension   Cough   ESRD (end stage renal disease) (HCC)   Acute systolic CHF (congestive heart failure) (HCC)   Closed left femoral fracture (HCC)   Chronic hypoxic respiratory failure (HCC)   HFrEF (heart failure with reduced ejection fraction) (HCC)   History of CAD (coronary artery disease)   Generalized anxiety disorder   Hypokalemia   Dependence on renal dialysis   Hypertensive heart and chronic kidney disease with heart failure and with stage 5 chronic kidney disease, or end stage renal disease (HCC)   Long term (current) use of aspirin    Left arm pain   Heme positive  stool     HPI:   Patient is a 69 year old female with multiple comorbidities.  She was hospitalized at [facility]  in January with hip pain after a fall.  X-ray showed a left femoral neck fracture.  She underwent repair/ fixation of the fracture on 01/19/24.  She was discharged inpatient rehab on 01/30/2024 due to decreased functional mobility.   Patient has chronic anemia.  We were asked to see her for declining hemoglobin during this admission.  Additionally she was fecal occult blood positive on 01/30/2024.   Patient tells me she has chronic anemia.  She describes getting iron  infusions through one of her [facility] providers  ( ?  Possibly her nephrologist ).  Last infusion about 1 month ago . She very occasionally has a small amount of rectal bleeding if she strains to have a bowel movement.  Otherwise she has never seen any blood in her stool.  No black stools.  She has no abdominal pain.  No nausea, vomiting or any other GI symptoms.  She denies family history of colon cancer.  She has never had a colonoscopy.  She was told by healthcare provider that she was unable to have a colonoscopy due to being on dialysis .  Her  last Cologuard was in 2023 and it was negative.  Patient does not take any NSAIDs  Ferritin 1239, TIBC 200, iron  saturation 20%  Recent Labs    02/08/24 0656  ALBUMIN  2.9*   Recent Labs    02/06/24 1533 02/08/24 0656  WBC 4.9 4.4  HGB 8.1* 7.9*  HCT 25.7* 25.8*  MCV 94.8 97.7  PLT 195 194   Recent Labs    02/06/24 1533 02/08/24 0656  NA 131* 134*  K 4.3 4.9  CL 91* 95*  CO2 28 31  GLUCOSE 96 82  BUN 49* 37*  CREATININE 8.40* 7.15*  CALCIUM  8.7* 9.0    Review of Systems: All systems reviewed and negative except where noted in HPI.  Physical Exam: Vital signs in last 24 hours: Temp:  [98 F (36.7 C)-98.9 F (37.2 C)] 98 F (36.7 C) (02/05 1424) Pulse Rate:  [12-105] 90 (02/05 1700) Resp:  [12-30] 21 (02/05 1700) BP: (120-141)/(64-79) 132/66 (02/05  1700) SpO2:  [96 %-100 %] 100 % (02/05 1700)   General:  Pleasant female in NAD, in dialysis Psych:  Cooperative. Normal mood and affect Eyes: Pupils equal Ears:  Normal auditory acuity Nose: No deformity, discharge or lesions Neck:  Supple, no masses felt Lungs: Chest is clear to auscultation.  Heart:  Regular rate, regular rhythm.  Abdomen: Limited exam, patient is currently undergoing dialysis but abdomen is soft, nontender, active bowel sounds, Rectal :  Deferred Msk: Symmetrical without gross deformities.  Neurologic:  Alert, oriented, grossly normal neurologically Extremities : No edema Skin:  Intact without significant lesions.   OUTPATIENT MEDICATIONS Prior to Admission medications  Medication Sig Start Date End Date Taking? Authorizing Provider  aspirin  EC 325 MG tablet Take 1 tablet (325 mg total) by mouth daily. 01/21/24 02/20/24  Danton Lauraine LABOR, PA-C  benzonatate  (TESSALON ) 200 MG capsule Take 1 capsule (200 mg total) by mouth 3 (three) times daily. 01/30/24   Christobal Guadalajara, MD  cholecalciferol  (CHOLECALCIFEROL ) 25 MCG tablet Take 2 tablets (2,000 Units total) by mouth daily. 01/21/24 04/20/24  Danton Lauraine LABOR, PA-C  diphenhydrAMINE  (BENADRYL ) 2 % cream Apply 1 Application topically 2 (two) times daily as needed for itching.    [provider]  docusate sodium  (COLACE) 100 MG capsule Take 1 capsule (100 mg total) by mouth 2 (two) times daily. 01/30/24   Christobal Guadalajara, MD  ferric citrate  (AURYXIA ) 1 GM 210 MG(Fe) tablet Take 210-420 mg by mouth See admin instructions. Take 420 mg  by mouth with meals and 210 mg with snacks Patient not taking: Reported on 01/19/2024    [provider]  HYDROcodone -acetaminophen  (NORCO/VICODIN) 5-325 MG tablet Take 1 tablet by mouth every 6 (six) hours as needed for severe pain (pain score 7-10). 01/21/24   Danton Lauraine LABOR, PA-C  metoprolol  succinate (TOPROL -XL) 25 MG 24 hr tablet Take 1 tablet (25 mg total) by mouth daily. 11/07/23    Thukkani, Arun K, MD  multivitamin (RENA-VIT) TABS tablet Take 1 tablet by mouth at bedtime.    [provider]  OXYGEN  Inhale 2 L/min into the lungs continuous.    [provider]  polyethylene glycol (MIRALAX  / GLYCOLAX ) 17 g packet Take 17 g by mouth daily as needed for mild constipation. 01/30/24   Christobal Guadalajara, MD    Allergies as of 01/30/2024 - Reviewed 01/30/2024  Allergen Reaction Noted   Bee venom Anaphylaxis 10/05/2021   Shellfish protein-containing drug products Anaphylaxis, Swelling, and Other (See Comments) 02/25/2020   Azithromycin  Hives  08/08/2022   Amoxicillin  12/31/2023   Darbepoetin alfa  Other (See Comments) 08/02/2023   Egg solids, whole  07/14/2020   Iodinated contrast media  12/31/2023   Mushroom Hives 01/19/2024   Bidil  [isosorb dinitrate-hydralazine ]  08/08/2022   Chlorhexidine  Rash 03/26/2020   Egg protein-containing drug products Nausea Only    Erythromycin Nausea Only and Other (See Comments) 12/06/2014   Hydralazine   08/08/2022   Iodine  Rash and Other (See Comments) 06/27/2016   Penicillins Rash 12/06/2014    INPATIENT MEDICATIONS Current Facility-Administered Medications  Medication Dose Route Frequency Provider Last Rate Last Admin   acetaminophen  (TYLENOL ) tablet 325-650 mg  325-650 mg Oral Q6H PRN Angiulli, Daniel J, PA-C   650 mg at 02/06/24 2008   aspirin  EC tablet 81 mg  81 mg Oral Daily Raulkar, Sven SQUIBB, MD   81 mg at 02/08/24 0736   cholecalciferol  (VITAMIN D3) 25 MCG (1000 UNIT) tablet 2,000 Units  2,000 Units Oral Daily Angiulli, Daniel J, PA-C   2,000 Units at 02/08/24 9264   diphenhydrAMINE -zinc  acetate (BENADRYL ) 2-0.1 % cream 1 Application  1 Application Topical Daily PRN Angiulli, Daniel J, PA-C   1 Application at 02/05/24 2239   docusate sodium  (COLACE) capsule 100 mg  100 mg Oral BID Angiulli, Daniel J, PA-C   100 mg at 02/08/24 9264   guaiFENesin  (MUCINEX ) 12 hr tablet 600 mg  600 mg Oral BID PRN Urbano Albright, MD        heparin  injection 1,000 Units  20 Units/kg Dialysis PRN Jake Maisie Fellows, PA-C       HYDROcodone -acetaminophen  (NORCO/VICODIN) 5-325 MG per tablet 1-2 tablet  1-2 tablet Oral Q6H PRN Angiulli, Daniel J, PA-C       methocarbamol  (ROBAXIN ) tablet 500 mg  500 mg Oral Q6H PRN Angiulli, Daniel J, PA-C       metoprolol  succinate (TOPROL -XL) 24 hr tablet 25 mg  25 mg Oral Daily Angiulli, Daniel J, PA-C   25 mg at 02/07/24 0746   ondansetron  (ZOFRAN ) tablet 4 mg  4 mg Oral Q6H PRN Angiulli, Daniel J, PA-C       Or   ondansetron  (ZOFRAN ) injection 4 mg  4 mg Intravenous Q6H PRN Angiulli, Daniel J, PA-C       paricalcitol  (ZEMPLAR ) injection 4.5 mcg  4.5 mcg Intravenous Q T,Th,Sa-HD Angiulli, Daniel J, PA-C   4.5 mcg at 02/08/24 1625   polyethylene glycol (MIRALAX  / GLYCOLAX ) packet 17 g  17 g Oral Daily PRN Pegge Toribio PARAS, PA-C         Past Medical History:  Diagnosis Date   Anxiety    ESRD (end stage renal disease) on dialysis (HCC)    Heart failure with mildly reduced ejection fraction (HFmrEF) (HCC)    Hypertension     Past Surgical History:  Procedure Laterality Date   AV FISTULA PLACEMENT Left 03/13/2020   Procedure: Creation of Left Brachiocephalic fistula;  Surgeon: Gretta Lonni PARAS, MD;  Location: [facility] OR;  Service: Vascular;  Laterality: Left;   HIP PINNING,CANNULATED Left 01/19/2024   Procedure: FIXATION, FEMUR, NECK, PERCUTANEOUS, USING SCREW;  Surgeon: Kendal Franky SQUIBB, MD;  Location: [facility] OR;  Service: Orthopedics;  Laterality: Left;   IR FLUORO GUIDE CV LINE RIGHT  02/26/2020   IR FLUORO GUIDE CV LINE RIGHT  03/09/2020   IR US  GUIDE VASC ACCESS RIGHT  02/26/2020   IR US  GUIDE VASC ACCESS RIGHT  03/09/2020   RIGHT HEART CATH N/A 05/12/2023   Procedure: RIGHT HEART CATH;  Surgeon: Thukkani, Arun K, MD;  Location: [facility] INVASIVE CV LAB;  Service: Cardiovascular;  Laterality: N/A;   RIGHT/LEFT HEART CATH AND CORONARY ANGIOGRAPHY N/A 10/07/2021   Procedure: RIGHT/LEFT HEART CATH AND  CORONARY ANGIOGRAPHY;  Surgeon: Jordan, Peter M, MD;  Location: [facility] INVASIVE CV LAB;  Service: Cardiovascular;  Laterality: N/A;    Family History  Problem Relation Age of Onset   Dementia Mother    Diabetes Sister    Hypertension Son     Social History   Socioeconomic History   Marital status: Single    Spouse name: Not on file   Number of children: Not on file   Years of education: Not on file   Highest education level: Not on file  Occupational History   Not on file  Tobacco Use   Smoking status: Never   Smokeless tobacco: Never  Vaping Use   Vaping status: Never Used  Substance and Sexual Activity   Alcohol use: Not Currently   Drug use: Not Currently    Types: Marijuana    Comment: Marijuana years ago   Sexual activity: Not Currently  Other Topics Concern   Not on file  Social History Narrative   Not on file   Social Drivers of Health   Tobacco Use: Low Risk (01/31/2024)   Patient History    Smoking Tobacco Use: Never    Smokeless Tobacco Use: Never    Passive Exposure: Not on file  Recent Concern: Tobacco Use - Medium Risk (01/12/2024)   Received from [facility]   Patient History    Smoking Tobacco Use: Former    Smokeless Tobacco Use: Never    Passive Exposure: Not on file  Financial Resource Strain: Low Risk (04/04/2021)   Received from [facility]   Overall Financial Resource Strain (CARDIA)    Difficulty of Paying Living Expenses: Not very hard  Food Insecurity: No Food Insecurity (01/19/2024)   Epic    Worried About Radiation Protection Practitioner of Food in the Last Year: Never true    Ran Out of Food in the Last Year: Never true  Transportation Needs: No Transportation Needs (01/19/2024)   Epic    Lack of Transportation (Medical): No    Lack of Transportation (Non-Medical): No  Physical Activity: Unknown (04/04/2021)   Received from [facility] visits prior to 03/05/2022., [facility]   Exercise Vital Sign    On average, how many days per week  do you engage in moderate to strenuous exercise (like a brisk walk)?: 3 days    Minutes of Exercise per Session: Not on file  Stress: No Stress Concern Present (04/04/2021)   Received from [facility] visits prior to 03/05/2022., [facility]   [employer] of Occupational Health - Occupational Stress Questionnaire    Feeling of Stress : Only a little  Social Connections: Moderately Isolated (01/19/2024)   Social Connection and Isolation Panel    Frequency of Communication with Friends and Family: More than three times a week    Frequency of Social Gatherings with Friends and Family: More than three times a week    Attends Religious Services: More than 4 times per year    Active Member of [employer] or Organizations: No    Attends Banker Meetings: Never    Marital Status: Widowed  Intimate Partner Violence: Not At Risk (01/19/2024)   Epic    Fear of Current or Ex-Partner: No    Emotionally Abused: No  Physically Abused: No    Sexually Abused: No  Depression (PHQ2-9): Low Risk (03/03/2023)   Depression (PHQ2-9)    PHQ-2 Score: 1  Alcohol Screen: Not on file  Housing: Low Risk (01/19/2024)   Epic    Unable to Pay for Housing in the Last Year: No    Number of Times Moved in the Last Year: 0    Homeless in the Last Year: No  Utilities: Not At Risk (01/19/2024)   Epic    Threatened with loss of utilities: No  Health Literacy: Not on file    Code Status   Code Status: Full Code   Vina Dasen, NP-C   02/08/2024, 5:06 PM     "

## 2024-02-08 NOTE — Progress Notes (Signed)
 Patient ID: Barbara Crawford, female   DOB: 28-Nov-1955, 69 y.o.   MRN: [id number]  Spoke with Tiara regarding discharge date and plan. SW expressed that the patient is very anxious about her discharge date and her progress.   Assistance will be available following discharge. Dialysis will continue to be Tuesday, Thursday, Saturday with the family working together to provide transportation on Saturdays.   The patient may be able to benefit from one more day of therapy. Will await for and update.   If an appeal needs to be filed, Wyn is aware the appeal process.

## 2024-02-08 NOTE — Progress Notes (Addendum)
 Physical Therapy Session Note  Patient Details  Name: Barbara Crawford MRN: [id number] Date of Birth: 02-07-1955  Today's Date: 02/08/2024 PT Individual Time: 0800-0900 PT Individual Time Calculation (min): 60 min   Today's Date: 02/08/2024 PT Individual Time: 1101-1200 PT Individual Time Calculation (min): 59 min   Short Term Goals: Week 1:  PT Short Term Goal 1 (Week 1): STG=LTG due to ELOS  Skilled Therapeutic Interventions/Progress Updates:     Treatment 1: Pt seated EOB upon arrival. Pt denies pain and agreeable to therapy. Session emphasized increased independence with transfers, DC planning, and emotional support. Pt reported need to pass BM. Pt performed short distance amb transfer EOB <> BSC over toilet using RW with mod I. Continent of bowel. PT assessed pain interference, MMT, and LT sensation while pt seated EOB. PT used therapeutic use of self in attempt to calm pt who expresses significant anxiety regarding DC tomorrow. PT reviewed progress pt has made since eval and her CLOF which is at her goal level or higher. Pt states her daughter is unable to pick her up tomorrow and pt is worried about having to go up/down stairs without assistance - PT reached out to SW who came to room during session. PT reviewed pt's goals of S or amb and CGA for stair negotiation and provided education on meaning of these levels which is why she will need her son or daughter's assist/supervision. Pt verbalized understanding. Pt's daughter was unable to attend family training, however, OT helped pt take a video of her navigating steps yesterday for her daughter to use as reference. Pt expressed appreciation for time taken to address her concerns in attempt to reduce anxiety. At end of session, pt remained seated EOB with bed alarm set and all needs in reach.  Treatment 2: Pt seated in WC upon arrival. Pt denies pain and agreeable to therapy. Session emphasized functional strengthening, endurance, and  activity tolerance with transfers and stair negotiation. Pt practiced short distance amb transfer WC to simulated car using RW with mod I for amb and S for transfer. Pt asc/desc 10 ft ramp using RW with S and ++ time. Pt practiced stair navigation in stairwell - pt asc/desc 11 steps using L HR with CGA/S and assist with O2 - pt took seated rest break at top prior to descent due to fatigue - ++ time required for ascent due to anxiety and L hip discomfort. Once back in room, pt remained seated in [facility] with all needs in reach at end of session. Pt provided with ice pack for L hip to address pain/discomfort.  Therapy Documentation Precautions:  Precautions Precautions: Fall Recall of Precautions/Restrictions: Intact Precaution/Restrictions Comments: reminders to elevate LLE Restrictions Weight Bearing Restrictions Per Provider Order: Yes LLE Weight Bearing Per Provider Order: Weight bearing as tolerated  Therapy/Group: Individual Therapy  [name], PT, DPT 02/08/2024, 7:40 AM

## 2024-02-08 NOTE — Progress Notes (Signed)
 "  02/08/24 1741  Vital Signs  Temp 99 F (37.2 C)  Temp Source Oral  Pulse Rate 92  Pulse Rate Source Monitor  Resp (!) 23  BP 133/63  BP Location Right Arm  BP Method Automatic  Patient Position (if appropriate) Lying  Oxygen  Therapy  SpO2 100 %  O2 Device Room Air  Patient Activity (if Appropriate) In bed  Pulse Oximetry Type Continuous  Pain Assessment  Pain Scale 0-10  Pain Score 0  Dialysis Weight  Weight 51.5 kg  Type of Weight Post-Dialysis  Estimated Dry Weight  (UNK)  Post Treatment  Dialyzer Clearance Lightly streaked  Hemodialysis Intake (mL) 0 mL  Liters Processed 72  Fluid Removed (mL) 1000 mL  Tolerated HD Treatment Yes  Post-Hemodialysis Comments Tx safely d/c  AVG/AVF Arterial Site Held (minutes) 5 minutes  AVG/AVF Venous Site Held (minutes) 5 minutes  Fistula / Graft Left Upper arm Arteriovenous fistula  Placement Date/Time: 03/13/20 1101   Orientation: Left  Access Location: Upper arm  Access Type: Arteriovenous fistula  Site Condition No complications  Fistula / Graft Assessment Present;Thrill;Bruit  Status Deaccessed  Needle Size 15  Drainage Description None  Education / Care Plan  Dialysis Education Provided Yes  Outpatient Plan of Care Reviewed and on Chart Yes   Patient Details  Name: Barbara Crawford MRN: [id number] Date of Birth: 03-11-1955  Lettie: 3k                   Duration: 3 EDW: MELODIE BFR: 400  Heparin : no EPO: no Vitamin D : no Iron : no DFR: 300 Access: AVF Hepatitis Status: Immune  Most Recent Labs:  Lab Results  Component Value Date/Time   HGB 7.9 (L) 02/08/2024 06:56 AM   HGB 10.7 (L) 05/01/2023 01:31 PM   K 4.9 02/08/2024 06:56 AM   LABPROT 15.0 10/27/2023 12:38 AM   INR 1.1 10/27/2023 12:38 AM   CALCIUM  9.0 02/08/2024 06:56 AM   CALCIUM  8.3 (L) 03/30/2020 04:11 AM   PTH 130 (H) 03/30/2020 04:11 AM   PTH Comment 03/30/2020 04:11 AM   ALBUMIN  2.9 (L) 02/08/2024 06:56 AM   Microbiology:  Results for  orders placed or performed during the hospital encounter of 12/30/23  Resp panel by RT-PCR (RSV, Flu A&B, Covid) Anterior Nasal Swab     Status: None   Collection Time: 12/30/23  2:35 AM   Specimen: Anterior Nasal Swab  Result Value Ref Range Status   SARS Coronavirus 2 by RT PCR NEGATIVE NEGATIVE Final   Influenza A by PCR NEGATIVE NEGATIVE Final   Influenza B by PCR NEGATIVE NEGATIVE Final    Comment: (NOTE) The Xpert Xpress SARS-CoV-2/FLU/RSV plus assay is intended as an aid in the diagnosis of influenza from Nasopharyngeal swab specimens and should not be used as a sole basis for treatment. Nasal washings and aspirates are unacceptable for Xpert Xpress SARS-CoV-2/FLU/RSV testing.  Fact Sheet for Patients: bloggercourse.com  Fact Sheet for Healthcare Providers: seriousbroker.it  This test is not yet approved or cleared by the [place]  FDA and has been authorized for detection and/or diagnosis of SARS-CoV-2 by FDA under an Emergency Use Authorization (EUA). This EUA will remain in effect (meaning this test can be used) for the duration of the COVID-19 declaration under Section 564(b)(1) of the Act, 21 U.S.C. section 360bbb-3(b)(1), unless the authorization is terminated or revoked.     Resp Syncytial Virus by PCR NEGATIVE NEGATIVE Final    Comment: (NOTE) Fact Sheet for  Patients: bloggercourse.com  Fact Sheet for Healthcare Providers: seriousbroker.it  This test is not yet approved or cleared by the [place]  FDA and has been authorized for detection and/or diagnosis of SARS-CoV-2 by FDA under an Emergency Use Authorization (EUA). This EUA will remain in effect (meaning this test can be used) for the duration of the COVID-19 declaration under Section 564(b)(1) of the Act, 21 U.S.C. section 360bbb-3(b)(1), unless the authorization is terminated  or revoked.  Performed at [facility], [address] [place], [place] [address]   Culture, blood (routine x 2)     Status: Abnormal   Collection Time: 12/30/23  2:42 AM   Specimen: BLOOD RIGHT FOREARM  Result Value Ref Range Status   Specimen Description BLOOD RIGHT FOREARM  Final   Special Requests   Final    BOTTLES DRAWN AEROBIC AND ANAEROBIC Blood Culture adequate volume   Culture  Setup Time   Final    GRAM POSITIVE COCCI IN BOTH AEROBIC AND ANAEROBIC BOTTLES CRITICAL RESULT CALLED TO, READ BACK BY AND VERIFIED WITH: PHARMD HONORA BRAZIER [id number] AT 1930 BY EC    Culture (A)  Final    STAPHYLOCOCCUS EPIDERMIDIS THE SIGNIFICANCE OF ISOLATING THIS ORGANISM FROM A SINGLE SET OF BLOOD CULTURES WHEN MULTIPLE SETS ARE DRAWN IS UNCERTAIN. PLEASE NOTIFY THE MICROBIOLOGY DEPARTMENT WITHIN ONE WEEK IF SPECIATION AND SENSITIVITIES ARE REQUIRED. Performed at [facility], [address] [place], [place] [address]    Report Status 01/01/2024 FINAL  Final  Blood Culture ID Panel (Reflexed)     Status: Abnormal   Collection Time: 12/30/23  2:42 AM  Result Value Ref Range Status   Enterococcus faecalis NOT DETECTED NOT DETECTED Final   Enterococcus Faecium NOT DETECTED NOT DETECTED Final   Listeria monocytogenes NOT DETECTED NOT DETECTED Final   Staphylococcus species DETECTED (A) NOT DETECTED Final    Comment: CRITICAL RESULT CALLED TO, READ BACK BY AND VERIFIED WITH: PHARMD HONORA BRAZIER [id number] AT 1930 BY EC    Staphylococcus aureus (BCID) NOT DETECTED NOT DETECTED Final   Staphylococcus epidermidis DETECTED (A) NOT DETECTED Final    Comment: CRITICAL RESULT CALLED TO, READ BACK BY AND VERIFIED WITH: PHARMD HONORA BRAZIER [id number] AT [id number] BY EC    Staphylococcus lugdunensis NOT DETECTED NOT DETECTED Final   Streptococcus species NOT DETECTED NOT DETECTED Final   Streptococcus agalactiae NOT DETECTED NOT DETECTED Final   Streptococcus pneumoniae NOT DETECTED NOT  DETECTED Final   Streptococcus pyogenes NOT DETECTED NOT DETECTED Final   A.calcoaceticus-baumannii NOT DETECTED NOT DETECTED Final   Bacteroides fragilis NOT DETECTED NOT DETECTED Final   Enterobacterales NOT DETECTED NOT DETECTED Final   Enterobacter cloacae complex NOT DETECTED NOT DETECTED Final   Escherichia coli NOT DETECTED NOT DETECTED Final   Klebsiella aerogenes NOT DETECTED NOT DETECTED Final   Klebsiella oxytoca NOT DETECTED NOT DETECTED Final   Klebsiella pneumoniae NOT DETECTED NOT DETECTED Final   Proteus species NOT DETECTED NOT DETECTED Final   Salmonella species NOT DETECTED NOT DETECTED Final   Serratia marcescens NOT DETECTED NOT DETECTED Final   Haemophilus influenzae NOT DETECTED NOT DETECTED Final   Neisseria meningitidis NOT DETECTED NOT DETECTED Final   Pseudomonas aeruginosa NOT DETECTED NOT DETECTED Final   Stenotrophomonas maltophilia NOT DETECTED NOT DETECTED Final   Candida albicans NOT DETECTED NOT DETECTED Final   Candida auris NOT DETECTED NOT DETECTED Final   Candida glabrata NOT DETECTED NOT DETECTED Final   Candida krusei NOT DETECTED  NOT DETECTED Final   Candida parapsilosis NOT DETECTED NOT DETECTED Final   Candida tropicalis NOT DETECTED NOT DETECTED Final   Cryptococcus neoformans/gattii NOT DETECTED NOT DETECTED Final   Methicillin resistance mecA/C NOT DETECTED NOT DETECTED Final    Comment: Performed at [facility], [address] [place], [place] [address]  Culture, blood (routine x 2)     Status: None   Collection Time: 12/30/23 10:10 AM   Specimen: BLOOD RIGHT HAND  Result Value Ref Range Status   Specimen Description BLOOD RIGHT HAND  Final   Special Requests   Final    BOTTLES DRAWN AEROBIC AND ANAEROBIC Blood Culture adequate volume   Culture   Final    NO GROWTH 5 DAYS Performed at [facility], [address], [place], [place] [address]    Report Status 01/04/2024 FINAL  Final  Culture, blood (Routine X 2) w Reflex  to ID Panel     Status: None   Collection Time: 01/01/24  1:16 PM   Specimen: BLOOD RIGHT ARM  Result Value Ref Range Status   Specimen Description BLOOD RIGHT ARM  Final   Special Requests   Final    BOTTLES DRAWN AEROBIC ONLY Blood Culture adequate volume   Culture   Final    NO GROWTH 5 DAYS Performed at [facility], [address], [place], [place] [address]    Report Status 01/06/2024 FINAL  Final  Culture, blood (Routine X 2) w Reflex to ID Panel     Status: None   Collection Time: 01/01/24  1:16 PM   Specimen: BLOOD RIGHT ARM  Result Value Ref Range Status   Specimen Description BLOOD RIGHT ARM  Final   Special Requests   Final    BOTTLES DRAWN AEROBIC ONLY Blood Culture adequate volume   Culture   Final    NO GROWTH 5 DAYS Performed at [facility], [address], [place], [place] [address]    Report Status 01/06/2024 FINAL  Final   Antibiotics: none Radiology: none Pt tolerated tx well, UF 1000 ML, VSS, alert and oriented. Reported off to Camie Miyamoto, [facility]ITY [facility]DRAISER.  Nena Sayres 02/08/2024, 5:46 PM   "

## 2024-02-08 NOTE — Progress Notes (Incomplete Revision)
 Physical Therapy Discharge Summary  Patient Details  Name: Barbara Crawford MRN: [id number] Date of Birth: 1955/07/14  Date of Discharge from PT service:February 08, 2024   Patient has met 8 of 8 long term goals due to improved activity tolerance, improved balance, increased strength, increased range of motion, decreased pain, and ability to compensate for deficits.  Patient to discharge at an ambulatory level Supervision.   Patient's care partner is independent to provide the necessary physical assistance at discharge.  Recommendation:  Patient will benefit from ongoing skilled PT services in home health setting to continue to advance safe functional mobility, address ongoing impairments in strength, ROM, endurance, balance, stair negotiation, and minimize fall risk.  Equipment: No equipment provided  Reasons for discharge: treatment goals met and discharge from hospital  Patient/family agrees with progress made and goals achieved: Yes  PT Discharge Precautions/Restrictions Precautions Precautions: Fall Recall of Precautions/Restrictions: Intact Restrictions Weight Bearing Restrictions Per Provider Order: Yes LLE Weight Bearing Per Provider Order: Weight bearing as tolerated Pain Interference Pain Interference Pain Effect on Sleep: 2. Occasionally Pain Interference with Therapy Activities: 1. Rarely or not at all Pain Interference with Day-to-Day Activities: 1. Rarely or not at all Vision/Perception  Vision - History Ability to See in Adequate Light: 0 Adequate Perception Perception: Within Functional Limits Praxis Praxis: WFL  Cognition Overall Cognitive Status: Within Functional Limits for tasks assessed Arousal/Alertness: Awake/alert Memory: Appears intact Awareness: Appears intact Problem Solving: Appears intact Safety/Judgment: Appears intact Sensation Sensation Light Touch: Appears Intact Proprioception: Appears Intact Coordination Gross Motor Movements  are Fluid and Coordinated: No Motor  Motor Motor: Within Functional Limits  Mobility Bed Mobility Bed Mobility: Sit to Supine;Rolling Left;Rolling Right;Supine to Sit Rolling Right: Independent with assistive device Rolling Left: Independent with assistive device Supine to Sit: Independent with assistive device Sit to Supine: Independent with assistive device Transfers Transfers: Sit to Stand;Stand to Sit;Stand Pivot Transfers Sit to Stand: Independent with assistive device Stand to Sit: Independent with assistive device Stand Pivot Transfers: Independent with assistive device Stand Pivot Transfer Details: Verbal cues for safe use of DME/AE;Verbal cues for technique Transfer (Assistive device): Rolling walker Locomotion  Gait Ambulation: Yes Gait Assistance: Supervision/Verbal cueing (++ time) Gait Distance (Feet): 150 Feet Assistive device: Rolling walker Gait Gait: Yes Gait Pattern: Impaired Gait Pattern: Step-to pattern;Decreased step length - left;Decreased step length - right;Decreased stance time - left;Decreased stride length;Antalgic;Decreased weight shift to left Gait velocity: decreased Stairs / Additional Locomotion Stairs: Yes Stairs Assistance: Supervision/Verbal cueing;Contact Guard/Touching assist Stair Management Technique: One rail Left Number of Stairs: 12 Height of Stairs: 6 Ramp: Supervision/Verbal cueing Curb: Contact Guard/Touching assist Wheelchair Mobility Wheelchair Mobility: No  Trunk/Postural Assessment  Cervical Assessment Cervical Assessment: Exceptions to [facility] Thoracic Assessment Thoracic Assessment: Exceptions to [facility] Lumbar Assessment Lumbar Assessment: Within Functional Limits Postural Control Postural Control: Within Functional Limits  Balance Balance Balance Assessed: Yes Dynamic Sitting Balance Dynamic Sitting - Balance Support: Feet supported Dynamic Sitting - Level of Assistance: 6: Modified independent (Device/Increase  time) Static Standing Balance Static Standing - Balance Support: Right upper extremity supported;Left upper extremity supported Static Standing - Level of Assistance: 6: Modified independent (Device/Increase time) Dynamic Standing Balance Dynamic Standing - Balance Support: Right upper extremity supported;Left upper extremity supported Dynamic Standing - Level of Assistance: 6: Modified independent (Device/Increase time) Extremity Assessment  RLE Assessment RLE Assessment: Within Functional Limits LLE Assessment General Strength Comments: DF/PF 4/5, knee grossly 4+/5, hip grossly 3+/5   [name], PT,  DPT 02/08/2024, 8:30 AM

## 2024-02-09 ENCOUNTER — Other Ambulatory Visit (HOSPITAL_COMMUNITY): Payer: Self-pay

## 2024-02-09 MED ORDER — PROSOURCE PLUS PO LIQD
30.0000 mL | Freq: Two times a day (BID) | ORAL | Status: AC
  Administered 2024-02-09: 30 mL via ORAL
  Filled 2024-02-09: qty 30

## 2024-02-09 NOTE — Progress Notes (Signed)
 Inpatient Rehabilitation Discharge Medication Review by a Pharmacist  A complete drug regimen review was completed for this patient to identify any potential clinically significant medication issues.  High Risk Drug Classes Is patient taking? Indication by Medication  Antipsychotic No   Anticoagulant No   Antibiotic No   Opioid Yes  Norco- acute pain   Antiplatelet Yes Aspirin - cva ppx  Hypoglycemics/insulin  No   Vasoactive Medication Yes Toprol - HTN/HFrEF  Chemotherapy No   Other Yes Zemplar - CKD, secondary hyperparathyroid Guafenesin, tessalon  pearls - expectorant, cough  VitD, MVI- supplement  PEG, docusate- constipation      Type of Medication Issue Identified Description of Issue Recommendation(s)  Drug Interaction(s) (clinically significant)     Duplicate Therapy     Allergy     No Medication Administration End Date     Incorrect Dose     Additional Drug Therapy Needed     Significant med changes from prior encounter (inform family/care partners about these prior to discharge). PTA meds discontinued- auryxia  d/t inability to tolerate    Other       Clinically significant medication issues were identified that warrant physician communication and completion of prescribed/recommended actions by midnight of the next day:  No   Time spent performing this drug regimen review (minutes):  30   Donny Alert, PharmD, [place] Clinical Pharmacist Please see AMION for all Pharmacists' Contact Phone Numbers 02/09/2024, 7:03 AM

## 2024-02-09 NOTE — Progress Notes (Signed)
 Occupational Therapy Weekly Progress Note  Patient Details  Name: Barbara Crawford MRN: [id number] Date of Birth: 07-Aug-1955  Beginning of progress report period: January 31, 2024 End of progress report period: February 09, 2024  Today's Date: 02/09/2024 OT Individual Time: 8699-8655 OT Individual Time Calculation (min): 44 min    Patient has met 0 of 1 short term goals.  Patient continues to progress to LTG. Patient at overall SUP to mod I level. Patient planned D/C Sunday.   Patient continues to demonstrate the following deficits: muscle joint tightness, decreased cardiorespiratoy endurance and decreased oxygen  support, and decreased standing balance and decreased balance strategies and therefore will continue to benefit from skilled OT intervention to enhance overall performance with BADL, iADL, and Reduce care partner burden.  Patient progressing toward long term goals..  Continue plan of care.  OT Short Term Goals Week 2:  OT Short Term Goal 1 (Week 2): STG=LTG d/t ELOS  Skilled Therapeutic Interventions/Progress Updates:   Patient agreeable to participate in OT session. Reports 0/10 pain level.   Patient participated in skilled OT session focusing on functional activity tolerance, LE strengthening/ ROM, SLS. Patient received in room. Transported to ortho gym for time management. Patient completed NuSTEP level 3 50 spm for increased ROM and LE strengthening. Patient then completed SLS with toe taps with RW for increased trust in LE for stair management 2x10 with CGA to sup for balance . Patient then returned to room, all needs I reach .   Therapy Documentation Precautions:  Precautions Precautions: Fall Recall of Precautions/Restrictions: Intact Precaution/Restrictions Comments: reminders to elevate LLE Restrictions Weight Bearing Restrictions Per Provider Order: Yes LLE Weight Bearing Per Provider Order: Weight bearing as tolerated  Therapy/Group: Individual  Therapy  D'mariea L Demiya Magno 02/09/2024, 4:35 PM

## 2024-02-09 NOTE — Progress Notes (Signed)
 Patient ID: Barbara Crawford, female   DOB: December 07, 1955, 69 y.o.   MRN: [id number]  [facility] referral order emailed to [facility] with [facility].

## 2024-02-09 NOTE — Plan of Care (Signed)
°  Problem: Consults Goal: RH GENERAL PATIENT EDUCATION Description: See Patient Education module for education specifics. Outcome: Progressing Goal: Skin Care Protocol Initiated - if Braden Score 18 or less Description: If consults are not indicated, leave blank or document N/A Outcome: Progressing Goal: Nutrition Consult-if indicated Outcome: Progressing Goal: Diabetes Guidelines if Diabetic/Glucose > 140 Description: If diabetic or lab glucose is > 140 mg/dl - Initiate Diabetes/Hyperglycemia Guidelines & Document Interventions  Outcome: Progressing   Problem: RH BOWEL ELIMINATION Goal: RH STG MANAGE BOWEL WITH ASSISTANCE Description: STG Manage Bowel with Assistance. Outcome: Progressing Goal: RH STG MANAGE BOWEL W/MEDICATION W/ASSISTANCE Description: STG Manage Bowel with Medication with Assistance. Outcome: Progressing   Problem: RH BLADDER ELIMINATION Goal: RH STG MANAGE BLADDER WITH ASSISTANCE Description: STG Manage Bladder With Assistance Outcome: Progressing Goal: RH STG MANAGE BLADDER WITH MEDICATION WITH ASSISTANCE Description: STG Manage Bladder With Medication With Assistance. Outcome: Progressing Goal: RH STG MANAGE BLADDER WITH EQUIPMENT WITH ASSISTANCE Description: STG Manage Bladder With Equipment With Assistance Outcome: Progressing   Problem: RH SKIN INTEGRITY Goal: RH STG SKIN FREE OF INFECTION/BREAKDOWN Outcome: Progressing Goal: RH STG MAINTAIN SKIN INTEGRITY WITH ASSISTANCE Description: STG Maintain Skin Integrity With Assistance. Outcome: Progressing Goal: RH STG ABLE TO PERFORM INCISION/WOUND CARE W/ASSISTANCE Description: STG Able To Perform Incision/Wound Care With Assistance. Outcome: Progressing   Problem: RH SAFETY Goal: RH STG ADHERE TO SAFETY PRECAUTIONS W/ASSISTANCE/DEVICE Description: STG Adhere to Safety Precautions With Assistance/Device. Outcome: Progressing Goal: RH STG DECREASED RISK OF FALL WITH ASSISTANCE Description: STG  Decreased Risk of Fall With Assistance. Outcome: Progressing   Problem: RH PAIN MANAGEMENT Goal: RH STG PAIN MANAGED AT OR BELOW PT'S PAIN GOAL Outcome: Progressing   Problem: RH KNOWLEDGE DEFICIT GENERAL Goal: RH STG INCREASE KNOWLEDGE OF SELF CARE AFTER HOSPITALIZATION Outcome: Progressing   Problem: RH Vision Goal: RH LTG Vision (Specify) Outcome: Progressing   Problem: RH Pre-functional/Other (Specify) Goal: RH LTG Pre-functional (Specify) Outcome: Progressing Goal: RH LTG Interdisciplinary (Specify) 1 Description: RH LTG Interdisciplinary (Specify)1 Outcome: Progressing Goal: RH LTG Interdisciplinary (Specify) 2 Description: RH LTG Interdisciplinary (Specify) 2  Outcome: Progressing   

## 2024-02-09 NOTE — Progress Notes (Signed)
 Physical Therapy Session Note  Patient Details  Name: Barbara Crawford MRN: [id number] Date of Birth: [birth year]  Today's Date: 02/09/2024 PT Individual Time: 0805-0830 PT Individual Time Calculation (min): 25 min   Short Term Goals: Week 1:  PT Short Term Goal 1 (Week 1): STG=LTG due to ELOS  Skilled Therapeutic Interventions/Progress Updates:     Pt received seated at EOB and agrees to therapy. NO complaint of pain. Pt performs stand step transfer to [facility] with RW and cues for positioning. WC transport to gym for time management. Pt performs sit to stand with cues for initiation, then ambulates x120' with RW and CGA, with cues for upright posture to improve balance, and decreasing WB through RW for energy conservation and improved body mechanics. WC transport back to room. Pt left seated with all needs within reach.   Therapy Documentation Precautions:  Precautions Precautions: Fall Recall of Precautions/Restrictions: Intact Precaution/Restrictions Comments: reminders to elevate LLE Restrictions Weight Bearing Restrictions Per Provider Order: Yes LLE Weight Bearing Per Provider Order: Weight bearing as tolerated    Therapy/Group: Individual Therapy  Elsie JAYSON Dawn, PT, DPT 02/09/2024, 4:04 PM

## 2024-02-09 NOTE — Progress Notes (Addendum)
 "                                                        PROGRESS NOTE   Subjective/Complaints: No new concerns or complaints.  Discharge adjusted to Sunday.  She does not have family they can pick or provide any assistance for the next couple days.  Soreness at fistula site unchanged from prior few days.  ROS: Patient denies fevers, new vision changes, dizziness, nausea, vomiting, diarrhea,  shortness of breath or chest pain, headache, or mood change. + low grade fever-resolved +left hip discomfort-pain is controlled + pain at dialysis fistula- continued, mild soreness    Objective:   No results found.  Recent Labs    02/06/24 1533 02/08/24 0656  WBC 4.9 4.4  HGB 8.1* 7.9*  HCT 25.7* 25.8*  PLT 195 194    Recent Labs    02/06/24 1533 02/08/24 0656  NA 131* 134*  K 4.3 4.9  CL 91* 95*  CO2 28 31  GLUCOSE 96 82  BUN 49* 37*  CREATININE 8.40* 7.15*  CALCIUM  8.7* 9.0     Intake/Output Summary (Last 24 hours) at 02/09/2024 1020 Last data filed at 02/08/2024 1741 Gross per 24 hour  Intake 240 ml  Output 1000 ml  Net -760 ml         Physical Exam: Vital Signs Blood pressure 126/67, pulse 93, temperature 98.4 F (36.9 C), temperature source Oral, resp. rate 17, height 5' 3 (1.6 m), weight 51.5 kg, SpO2 100%.  General: No apparent distress, sitting in WC appears comfortable HEENT: Head is normocephalic, atraumatic, sclera anicteric, oral mucosa moist Neck: Supple without JVD or lymphadenopathy Heart: Reg rate and rhythm. No murmurs rubs or gallops Chest: CTA bilaterally without wheezes, rales, or rhonchi; no distress, on 2L [place] Abdomen: Soft, non-tender, non-distended, bowel sounds positive. 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    Assessment/Plan: 1. Functional deficits which require 3+ hours per day of interdisciplinary therapy in a comprehensive inpatient rehab setting. Physiatrist is providing close team supervision and 24 hour management of active medical problems listed below. Physiatrist and rehab team continue to assess barriers to discharge/monitor patient progress toward functional and medical goals  Care Tool:  Bathing    Body parts bathed by patient: Right arm, Left arm, Chest, Left lower leg, Abdomen, Front perineal area, Face, Buttocks, Right upper leg, Left upper leg, Right lower leg         Bathing assist Assist Level: Independent with assistive device Assistive Device Comment: tub bench/grab bar   Upper Body Dressing/Undressing Upper body dressing   What is the patient wearing?: Pull over shirt    Upper body assist Assist Level: Independent    Lower Body Dressing/Undressing Lower body dressing      What is the patient wearing?: Underwear/pull up, Pants  Lower body assist Assist for lower body dressing: Independent with assitive device     Toileting Toileting    Toileting assist Assist for toileting: Independent with assistive device     Transfers Chair/bed transfer  Transfers assist     Chair/bed transfer assist level: Independent with assistive device Chair/bed transfer assistive device: Arboriculturist assist      Assist level: Supervision/Verbal cueing Assistive device: Walker-rolling Max distance: 150 ft   Walk 10 feet activity   Assist     Assist level: Supervision/Verbal cueing Assistive device: Walker-rolling   Walk 50 feet activity   Assist Walk 50 feet with 2  turns activity did not occur: Safety/medical concerns (pain/weakness/fatigue)  Assist level: Supervision/Verbal cueing Assistive device: Walker-rolling    Walk 150 feet activity   Assist Walk 150 feet activity did not occur: Safety/medical concerns (pain/weakness/fatigue)  Assist level: Supervision/Verbal cueing Assistive device: Walker-rolling    Walk 10 feet on uneven surface  activity   Assist Walk 10 feet on uneven surfaces activity did not occur: Safety/medical concerns (pain/weakness/fatigue)   Assist level: Supervision/Verbal cueing Assistive device: Walker-rolling   Wheelchair     Assist Is the patient using a wheelchair?: No Type of Wheelchair: Manual    Wheelchair assist level: Dependent - Patient 0% Max wheelchair distance: 150 ft    Wheelchair 50 feet with 2 turns activity    Assist        Assist Level: Dependent - Patient 0%   Wheelchair 150 feet activity     Assist      Assist Level: Dependent - Patient 0%   Blood pressure 126/67, pulse 93, temperature 98.4 F (36.9 C), temperature source Oral, resp. rate 17, height 5' 3 (1.6 m), weight 51.5 kg, SpO2 100%.  Medical Problem List and Plan: 1. Functional deficits secondary to  L femoral neck fx s/p Dr Kendal per fixation 01/19/24             -Per Ortho (1/19)patient may shower and can get incision wet, do not submerge.              -ELOS/Goals: 12-14 days- supervision            -Continue CIR  - Expected discharge has been adjusted 2/8, limited family assistance at home and does not have anyone who can pick her up.  Also may be good to recheck her hemoglobin and make sure levels remaining stable    2.  Antithrombotics: -DVT/anticoagulation:  Mechanical:  Antiembolism stockings, knee (TED hose) Bilateral lower extremities Sequential compression devices, below knee Bilateral lower extremities             -antiplatelet therapy: Aspirin -changed to 81mg  daily as per patient's request-  she is ambulating 150 feet             - Vas US  Doppler pending- neg for DVT 3. Pain Management: As needed Vicodin and Tylenol   - 1/30 pain well-controlled.  Not using frequent medications. 4. Mood/Behavior/Sleep: LCSW to follow for evaluation and support when available.              -antipsychotic agents: N/A 5. Neuropsych/cognition: This patient is capable of making decisions on her own behalf. 6. Skin/Wound Care: Routine pressure relief measures.  Change incisional dressing as needed. ?Staple remove at 2 weeks- sounds like 1/31?.  7. Fluids/Electrolytes/Nutrition: Monitor strict I&O and weight. Follow up labs per HD. - Moderate malnutrition: BMI 20.35 kg/m  -1/30 discontinue multivitamin and zinc   sulfate per patient request.  She has been declining these 8.  Close left femoral fracture: S/p percutaneous fixation of left femoral neck on 1/16.  Outpatient follow-up with Dr. Kendal in 2 weeks.             - WBAT LLE, continue PT. 9.  ESRD: On hemodialysis, TTS. Oliguric.  Continue HD per schedule.   -Mild hyperkalemia.  Continue binders and Auryxia . Monitor volume status and labs.     -1/28 reviewed nephrology note, Lokelma  ordered today for hyperkalemia. -1/29 reviewed nephrology note, has HD today 1/30 reviewed nephrology note next hemodialysis tomorrow The ferric citrate  discussed with nephrology yesterday she can  chew it tho or break into 1/4's or dissolve in water  Discussed that she would like phosphate binder d/ced- she has been refusing because it causes her to feel nauseous, discussed that she has been minimizing dietary phosphate intake and recent level is wnl, d/ced binder as per patient request- Will notify nephrology -2/2 will notify nephrology regarding mild soreness around her fistula  -2/3 patient has HD today.  Nephrology considering fistulogram if pain in this area does not improve.  -2/4 tolerated HD yesterday, continues to have some mild tenderness at AVF  -2/6  discussed with nephrology, no current plans for fistulogram but may consider as outpatient  10.  Essential HTN/chronic HFrEF: Euvolemic, BP stable.  Continue Toprol  25 mg daily. Weight reviewed and is stable  11.  Chronic hypoxic respiratory failure: Currently stable on 2 L O2 via nasal cannula (baseline). Weight reviewed and is decreased  12.  Cough: r/t recent pneumonia. Continue prn, Tessalon  and Tussionex suspension.  Encourage incentive spirometer.  - 1/29 reports this has improved continue to monitor  1/30 stop tessalon - pt declines to take, will change Mucinex  to as needed  Improved  13.  Vitamin D  deficiency: continue vitamin D3 Supp.  14.  Low-grade temp: 100.5 degrees, may need to repeat respiratory viral panel.   -1/28 yesterday Temperature 100.1.  No signs or symptoms of infection noted.  WBC not elevated.  Continue to monitor  -1/30 improved, patient reports she feels well.  Continue to monitor  -2/2 will asked nephrology if needed CBC with next labs.  Fevers have improved.  -2/4 TMAX 100 yesterday, WBC WNL 4.9 yesterday  -2/5-6 afebrile, continue to monitor.  WBC not elevated     15.  CAD: Continue Toprol -XL and aspirin .  16. Anemia of CKD  -1/28 HGB 9.4, continue to monitor  - 1/29 patient asked about her iron  supplementation.  Her ferritin 1493 and saturation 33.  Nephrology notes indicate Venofer, however does not look like she is getting needed.  Discussed with Dr. Melia -ferric citrate  is being used for binder.  Notified him she is declining these due to size of the pills. (See #9)  -2/3 will monitor repeat labs with dialysis-as scheduled for today  -2/4 HGB appears lower at 8.1 , Stool OB test ordered  -2/5 hemoglobin slightly lower at 7.9.  Do not see FOBT done today but she did have a prior 1 that was positive right before admission.  Will consult GI  -2/6 will ask nephrology for can check hemoglobin during dialysis tomorrow, patient does not like getting blood  draws.  Patient was seen by GI yesterday, she declined endoscopic evaluation.  She said she would consider if anemia continues to worsen.  17. Hyperkalemia due to ESRD  - 2/5 K+ stable 4.9     LOS: 10 days A FACE TO  FACE EVALUATION WAS PERFORMED  Murray Collier 02/09/2024, 10:20 AM     "

## 2024-02-09 NOTE — Progress Notes (Signed)
" °  [facility] Progress Note   Subjective:   Seen in room, sitting up in chair. Had just finished workout. Feeling tired but good overall. States there was confusion about her having pain in her AVG, she just meant the arm is sore from the rehabilitation workouts, not that the graft itself is bothering her. Feeling ready to go home Sunday. Pt will call outpatient dialysis center to confirm they have a chair for her Tues and to confirm transportation.   Objective Vitals:   02/08/24 1855 02/08/24 2038 02/09/24 0605 02/09/24 1305  BP: 132/62 (!) 125/55 126/67 121/60  Pulse: 100 (!) 103 93 88  Resp: 19 18 17    Temp: 99 F (37.2 C) (!) 97.5 F (36.4 C) 98.4 F (36.9 C) 98.9 F (37.2 C)  TempSrc: Oral Oral Oral Oral  SpO2: 100% 100% 100% 100%  Weight:      Height:       Physical Exam General: Sitting in chair. NAD.  Heart:mildly tachycardic Lungs: CTAB, normal WOB Abdomen: non tender, non distended Extremities: trace edema lower extremities Dialysis Access: LUE AVG +t  Additional Objective Labs: Basic Metabolic Panel: Recent Labs  Lab 02/04/24 0524 02/06/24 1533 02/08/24 0656  NA 135 131* 134*  K 3.4* 4.3 4.9  CL 96* 91* 95*  CO2 30 28 31   GLUCOSE 74 96 82  BUN 19 49* 37*  CREATININE 4.07* 8.40* 7.15*  CALCIUM  8.4* 8.7* 9.0  PHOS 3.2  --  4.4   Liver Function Tests: Recent Labs  Lab 02/08/24 0656  ALBUMIN  2.9*   CBC: Recent Labs  Lab 02/06/24 1533 02/08/24 0656  WBC 4.9 4.4  NEUTROABS 3.0  --   HGB 8.1* 7.9*  HCT 25.7* 25.8*  MCV 94.8 97.7  PLT 195 194     Iron  Studies:  Recent Labs    02/07/24 1613  IRON  39  TIBC 200*  FERRITIN 1,239*    Medications:   aspirin  EC  81 mg Oral Daily   cholecalciferol   2,000 Units Oral Daily   docusate sodium   100 mg Oral BID   metoprolol  succinate  25 mg Oral Daily   paricalcitol   4.5 mcg Intravenous Q T,Th,Sa-HD    Dialysis Orders [facility] TTS EDW 54 kg. 3.5 hours. 2K/2.5 calcium . AVG flow  rates: 400/600. Heparin : None. Meds: EPO 6200 units every treatment, Venofer, Zemplar  4.5 mcg every treatment. Binders: Auryxia  3 tabs 3 times daily AC.   Assessment/Plan: Closed femoral fracture: Fell while riding home from HD on bus. s/p surgery 1/16, ongoing PT/OT.  ESRD: TTS schedule. Started heparin  w/ HD tx while inpt due to alarming of machine, she does not get this outpt. No issues with access during HD per pt.  HTN/volume: BP controlled. Below EDW here, will need to be lowered at d/c. Trace edema but pt states this is from being up on her feet during workout. UF as tolerated.  Anemia of ESRD: Hgb 7.9, continues to trend down. Iron  is WNL. Allergic to aranesp . Transfuse prn.  Secondary HPTH: Ca and phos ok. No longer on auryxia  due to nausea.  Nutrition: Albumin  low, push pt and start supp.  Dispo: D/c planned for Sunday 2/8. Will dialyze at outpatient clinic Tuesday.    Barbara Sick, PA-C 02/09/2024, 1:44 PM  [facility]    "

## 2024-02-09 NOTE — Progress Notes (Signed)
 Inpatient Rehabilitation Care Coordinator Discharge Note   Patient Details  Name: Barbara Crawford MRN: [id number] Date of Birth: 22-Apr-1955   Discharge location: Home with daughter and son providng supprot.  Length of Stay: 11 days  Discharge activity level: Independent with assistive device  Home/community participation: Limited activity in the community for dialysis  Patient response un:Yzjouy Literacy - How often do you need to have someone help you when you read instructions, pamphlets, or other written material from your doctor or pharmacy?: Never  Patient response un:Dnrpjo Isolation - How often do you feel lonely or isolated from those around you?: Never  Services provided included: MD, RD, OT, PT, SLP, RN, CM, TR, Pharmacy, SW  Financial Services:  Field Seismologist Utilized: Private Insurance [employer]UE [employer] MEDICARE / BCBS MEDICARE  Choices offered to/list presented to: Patient/Family  Follow-up services arranged:  Home Health Home Health Agency: [facility] for PT/OT         Patient response to transportation need: Is the patient able to respond to transportation needs?: Yes In the past 12 months, has lack of transportation kept you from medical appointments or from getting medications?: No In the past 12 months, has lack of transportation kept you from meetings, work, or from getting things needed for daily living?: No   Patient/Family verbalized understanding of follow-up arrangements:  Yes  Individual responsible for coordination of the follow-up plan: Patient/Daughter -Tiara  Confirmed correct DME delivered: [employer]  Loreli 02/09/2024    Comments (or additional information): Will discharge home with daughter Sunday. Her son, [place] will provide transportation home between 9am-10am on Sunday.   Summary of Stay    Date/Time Discharge Planning CSW  01/31/24 1614 Per PMR, plans to discharge home with daughter who will take FMLA as needed. Will  await therapy follow-up recommendations. DS       [employer]  Loreli

## 2024-02-09 NOTE — Progress Notes (Signed)
 Contacted by CSW that pt will d/c on Sunday. Contacted [facility] to be advised pt will d/c on Sunday and should resume at clinic on Tuesday. Will fax d/c summary to clinic on Monday for continuation of care. Will assist as needed.   Randine Mungo Dialysis Navigator [phone]

## 2024-02-09 NOTE — Progress Notes (Addendum)
 Patient ID: Barbara Crawford, female   DOB: [birth year], 69 y.o.   MRN: [id number]  Patient will discharge on Sunday 02/08.  [facility] dialysis social worker, Randine Mungo updated.   Patient and family notified. Her son will be in between 9:00am-10:00am on Sunday for discharge.

## 2024-02-26 ENCOUNTER — Ambulatory Visit (HOSPITAL_COMMUNITY): Admitting: Cardiology

## 2024-03-18 ENCOUNTER — Encounter: Admitting: Dietician

## 2024-03-22 ENCOUNTER — Encounter: Admitting: Dietician
